# Patient Record
Sex: Male | Born: 1940 | Race: White | Hispanic: No | Marital: Married | Smoking: Never smoker
Health system: Southern US, Community
[De-identification: ages and names within clinical notes are randomized; demographics above are authoritative.]

## PROBLEM LIST (undated history)

## (undated) DIAGNOSIS — I714 Abdominal aortic aneurysm, without rupture, unspecified: Secondary | ICD-10-CM

## (undated) DIAGNOSIS — I779 Disorder of arteries and arterioles, unspecified: Secondary | ICD-10-CM

## (undated) DIAGNOSIS — T7840XA Allergy, unspecified, initial encounter: Secondary | ICD-10-CM

## (undated) DIAGNOSIS — H269 Unspecified cataract: Secondary | ICD-10-CM

## (undated) DIAGNOSIS — E785 Hyperlipidemia, unspecified: Secondary | ICD-10-CM

## (undated) DIAGNOSIS — R011 Cardiac murmur, unspecified: Secondary | ICD-10-CM

## (undated) DIAGNOSIS — M1990 Unspecified osteoarthritis, unspecified site: Secondary | ICD-10-CM

## (undated) DIAGNOSIS — N189 Chronic kidney disease, unspecified: Secondary | ICD-10-CM

## (undated) DIAGNOSIS — M109 Gout, unspecified: Secondary | ICD-10-CM

## (undated) DIAGNOSIS — D126 Benign neoplasm of colon, unspecified: Secondary | ICD-10-CM

## (undated) DIAGNOSIS — H409 Unspecified glaucoma: Secondary | ICD-10-CM

## (undated) DIAGNOSIS — I739 Peripheral vascular disease, unspecified: Secondary | ICD-10-CM

## (undated) DIAGNOSIS — I10 Essential (primary) hypertension: Secondary | ICD-10-CM

## (undated) DIAGNOSIS — E559 Vitamin D deficiency, unspecified: Secondary | ICD-10-CM

## (undated) DIAGNOSIS — K838 Other specified diseases of biliary tract: Secondary | ICD-10-CM

## (undated) DIAGNOSIS — I2510 Atherosclerotic heart disease of native coronary artery without angina pectoris: Secondary | ICD-10-CM

## (undated) HISTORY — DX: Benign neoplasm of colon, unspecified: D12.6

## (undated) HISTORY — PX: OTHER SURGICAL HISTORY: SHX169

## (undated) HISTORY — PX: CERVICAL DISC SURGERY: SHX588

## (undated) HISTORY — DX: Hyperlipidemia, unspecified: E78.5

## (undated) HISTORY — DX: Gout, unspecified: M10.9

## (undated) HISTORY — PX: TOTAL HIP ARTHROPLASTY: SHX124

## (undated) HISTORY — DX: Peripheral vascular disease, unspecified: I73.9

## (undated) HISTORY — DX: Abdominal aortic aneurysm, without rupture: I71.4

## (undated) HISTORY — DX: Vitamin D deficiency, unspecified: E55.9

## (undated) HISTORY — PX: SHOULDER ARTHROSCOPY: SHX128

## (undated) HISTORY — DX: Chronic kidney disease, unspecified: N18.9

## (undated) HISTORY — DX: Abdominal aortic aneurysm, without rupture, unspecified: I71.40

## (undated) HISTORY — DX: Disorder of arteries and arterioles, unspecified: I77.9

## (undated) HISTORY — DX: Unspecified cataract: H26.9

## (undated) HISTORY — PX: BACK SURGERY: SHX140

## (undated) HISTORY — PX: CARDIAC CATHETERIZATION: SHX172

## (undated) HISTORY — DX: Allergy, unspecified, initial encounter: T78.40XA

## (undated) HISTORY — DX: Unspecified glaucoma: H40.9

---

## 1991-06-26 HISTORY — PX: HERNIA REPAIR: SHX51

## 2001-06-25 DIAGNOSIS — D126 Benign neoplasm of colon, unspecified: Secondary | ICD-10-CM

## 2001-06-25 HISTORY — DX: Benign neoplasm of colon, unspecified: D12.6

## 2002-05-20 ENCOUNTER — Encounter (INDEPENDENT_AMBULATORY_CARE_PROVIDER_SITE_OTHER): Payer: Self-pay | Admitting: Specialist

## 2002-05-20 ENCOUNTER — Ambulatory Visit (HOSPITAL_COMMUNITY): Admission: RE | Admit: 2002-05-20 | Discharge: 2002-05-20 | Payer: Self-pay | Admitting: Gastroenterology

## 2004-03-06 ENCOUNTER — Ambulatory Visit (HOSPITAL_COMMUNITY): Admission: RE | Admit: 2004-03-06 | Discharge: 2004-03-07 | Payer: Self-pay | Admitting: Orthopaedic Surgery

## 2004-06-25 HISTORY — PX: EYE SURGERY: SHX253

## 2004-12-29 ENCOUNTER — Encounter: Admission: RE | Admit: 2004-12-29 | Discharge: 2004-12-29 | Payer: Self-pay | Admitting: Cardiology

## 2005-01-03 ENCOUNTER — Ambulatory Visit (HOSPITAL_COMMUNITY): Admission: RE | Admit: 2005-01-03 | Discharge: 2005-01-04 | Payer: Self-pay | Admitting: Cardiology

## 2005-12-06 ENCOUNTER — Encounter: Admission: RE | Admit: 2005-12-06 | Discharge: 2005-12-06 | Payer: Self-pay | Admitting: Cardiology

## 2005-12-13 ENCOUNTER — Inpatient Hospital Stay (HOSPITAL_COMMUNITY): Admission: RE | Admit: 2005-12-13 | Discharge: 2005-12-14 | Payer: Self-pay | Admitting: Cardiology

## 2006-03-06 ENCOUNTER — Inpatient Hospital Stay (HOSPITAL_COMMUNITY): Admission: RE | Admit: 2006-03-06 | Discharge: 2006-03-09 | Payer: Self-pay | Admitting: Orthopaedic Surgery

## 2007-01-29 ENCOUNTER — Observation Stay (HOSPITAL_COMMUNITY): Admission: EM | Admit: 2007-01-29 | Discharge: 2007-01-31 | Payer: Self-pay | Admitting: Emergency Medicine

## 2009-01-19 ENCOUNTER — Encounter: Admission: RE | Admit: 2009-01-19 | Discharge: 2009-01-19 | Payer: Self-pay | Admitting: Cardiology

## 2009-06-07 ENCOUNTER — Inpatient Hospital Stay (HOSPITAL_COMMUNITY): Admission: EM | Admit: 2009-06-07 | Discharge: 2009-06-10 | Payer: Self-pay | Admitting: Emergency Medicine

## 2009-06-07 ENCOUNTER — Encounter (INDEPENDENT_AMBULATORY_CARE_PROVIDER_SITE_OTHER): Payer: Self-pay | Admitting: Internal Medicine

## 2010-09-25 LAB — BASIC METABOLIC PANEL
Calcium: 8.7 mg/dL (ref 8.4–10.5)
Chloride: 110 mEq/L (ref 96–112)
Chloride: 113 mEq/L — ABNORMAL HIGH (ref 96–112)
Creatinine, Ser: 1.72 mg/dL — ABNORMAL HIGH (ref 0.4–1.5)
GFR calc Af Amer: 52 mL/min — ABNORMAL LOW (ref >60)
GFR calc non Af Amer: 40 mL/min — ABNORMAL LOW (ref >60)
Glucose, Bld: 95 mg/dL (ref 70–99)
Potassium: 4.2 mEq/L (ref 3.5–5.1)
Potassium: 4.2 mEq/L (ref 3.5–5.1)
Sodium: 140 mEq/L (ref 135–145)

## 2010-09-25 LAB — CBC
HCT: 29.9 % — ABNORMAL LOW (ref 39.0–52.0)
HCT: 31 % — ABNORMAL LOW (ref 39.0–52.0)
MCHC: 34.6 g/dL (ref 30.0–36.0)
MCV: 90.7 fL (ref 78.0–100.0)
Platelets: 126 10*3/uL — ABNORMAL LOW (ref 150–400)
RBC: 3.41 MIL/uL — ABNORMAL LOW (ref 4.22–5.81)
RDW: 15 % (ref 11.5–15.5)
WBC: 4.5 10*3/uL (ref 4.0–10.5)
WBC: 4.6 10*3/uL (ref 4.0–10.5)

## 2010-09-25 LAB — PROTIME-INR
INR: 1.03 (ref 0.00–1.49)
Prothrombin Time: 13.4 seconds (ref 11.6–15.2)

## 2010-09-26 LAB — CARDIAC PANEL(CRET KIN+CKTOT+MB+TROPI)
CK, MB: 0.8 ng/mL (ref 0.3–4.0)
Relative Index: INVALID (ref 0.0–2.5)
Total CK: 40 U/L (ref 7–232)

## 2010-09-26 LAB — URINALYSIS, ROUTINE W REFLEX MICROSCOPIC
Nitrite: NEGATIVE
Protein, ur: NEGATIVE mg/dL
Urobilinogen, UA: 1 mg/dL (ref 0.0–1.0)
pH: 5.5 (ref 5.0–8.0)

## 2010-09-26 LAB — BASIC METABOLIC PANEL
BUN: 24 mg/dL — ABNORMAL HIGH (ref 6–23)
CO2: 22 mEq/L (ref 19–32)
CO2: 24 mEq/L (ref 19–32)
Calcium: 8.8 mg/dL (ref 8.4–10.5)
Chloride: 109 mEq/L (ref 96–112)
Chloride: 112 mEq/L (ref 96–112)
Creatinine, Ser: 1.69 mg/dL — ABNORMAL HIGH (ref 0.4–1.5)
GFR calc Af Amer: 49 mL/min — ABNORMAL LOW (ref >60)
GFR calc non Af Amer: 40 mL/min — ABNORMAL LOW (ref >60)
Glucose, Bld: 135 mg/dL — ABNORMAL HIGH (ref 70–99)
Glucose, Bld: 99 mg/dL (ref 70–99)
Potassium: 4.2 mEq/L (ref 3.5–5.1)
Sodium: 140 mEq/L (ref 135–145)

## 2010-09-26 LAB — CBC
Hemoglobin: 10.4 g/dL — ABNORMAL LOW (ref 13.0–17.0)
MCV: 90.6 fL (ref 78.0–100.0)
RBC: 3.32 MIL/uL — ABNORMAL LOW (ref 4.22–5.81)
WBC: 5 10*3/uL (ref 4.0–10.5)

## 2010-09-26 LAB — DIFFERENTIAL
Basophils Absolute: 0 10*3/uL (ref 0.0–0.1)
Basophils Relative: 1 % (ref 0–1)
Eosinophils Relative: 4 % (ref 0–5)
Monocytes Absolute: 0.3 10*3/uL (ref 0.1–1.0)

## 2010-09-26 LAB — CK TOTAL AND CKMB (NOT AT ARMC)
CK, MB: 0.9 ng/mL (ref 0.3–4.0)
Relative Index: INVALID (ref 0.0–2.5)

## 2010-09-26 LAB — PROTHROMBIN GENE MUTATION

## 2010-09-26 LAB — ANTITHROMBIN III: AntiThromb III Func: 104 % (ref 76–126)

## 2010-09-26 LAB — D-DIMER, QUANTITATIVE: D-Dimer, Quant: 1.9 ug/mL-FEU — ABNORMAL HIGH (ref 0.00–0.48)

## 2010-09-26 LAB — POCT CARDIAC MARKERS: CKMB, poc: <1 ng/mL — ABNORMAL LOW (ref 1.0–8.0)

## 2010-09-26 LAB — TROPONIN I
Troponin I: 0.01 ng/mL (ref 0.00–0.06)
Troponin I: 0.01 ng/mL (ref 0.00–0.06)

## 2010-09-26 LAB — APTT
aPTT: 27 seconds (ref 24–37)
aPTT: 27 seconds (ref 24–37)

## 2010-09-26 LAB — LUPUS ANTICOAGULANT PANEL
Lupus Anticoagulant: NOT DETECTED
dRVVT Incubated 1:1 Mix: 38.5 secs (ref 36.1–47.0)

## 2010-09-26 LAB — HEPATIC FUNCTION PANEL
ALT: 14 U/L (ref 0–53)
AST: 18 U/L (ref 0–37)
Albumin: 3.5 g/dL (ref 3.5–5.2)
Bilirubin, Direct: 0.1 mg/dL (ref 0.0–0.3)
Indirect Bilirubin: 0.4 mg/dL (ref 0.3–0.9)
Total Protein: 6 g/dL (ref 6.0–8.3)

## 2010-09-26 LAB — PROTEIN S ACTIVITY: Protein S Activity: 92 % (ref 69–129)

## 2010-09-26 LAB — BETA-2-GLYCOPROTEIN I ABS, IGG/M/A: Beta-2-Glycoprotein I IgA: <3 U/mL (ref <=15)

## 2010-09-26 LAB — CALCIUM: Calcium: 8.5 mg/dL (ref 8.4–10.5)

## 2010-09-26 LAB — MAGNESIUM: Magnesium: 2 mg/dL (ref 1.5–2.5)

## 2010-09-26 LAB — PROTIME-INR: Prothrombin Time: 14.5 seconds (ref 11.6–15.2)

## 2010-09-26 LAB — FACTOR 5 LEIDEN

## 2010-11-07 NOTE — Cardiovascular Report (Signed)
NAME:  Terry Garner, Terry Garner NO.:  [id number]   MEDICAL RECORD NO.:  [id number]          PATIENT TYPE:  INP   LOCATION:  2807                         FACILITY:  [facility]   PHYSICIAN:  Bryson Dames, M.D.DATE OF BIRTH:  Aug 30, 1940   DATE OF PROCEDURE:  DATE OF DISCHARGE:                            CARDIAC CATHETERIZATION   PROCEDURE:  1. Selective coronary angiography by Judkins technique.  2. Retrograde left heart catheterization.  3. Left ventricular angiography.  4. Direct coronary artery stenting of the second diagonal branch of      the left anterior descending.   COMPLICATIONS:  None.   PROCEDURE:  The diagnostic left heart catheterization, LV angiogram, and  coronary angiography was performed by Tery Sanfilippo, M.D., and the  direct stenting of the mid-second diagonal branch was performed by Dr.  Myrtice Lauth.  I was present to proctor Dr. Elisabeth Cara during his left  heart catheterization.  No complications occurred.  Dr. Francine Graven  results show excellent angiographic renderings of the patient's coronary  arteries.  No complications occurred.   RESULTS:  Pressures:  The left ventricular pressure was [id number], end-  diastolic pressure 16.  Central aortic pressure 125/60, mean of 85.  No  aortic valve gradient by pullback technique.   ANGIOGRAPHIC RESULTS:  The patient's right coronary artery was small and  nondominant, with no significant atherosclerotic lesions seen.  There  was definite catheter damping of the proximal RCA which led to the  patient having a vagal episode with sinus bradycardia, rates of 30-33,  and low blood pressure.  The patient received intravenous fluids wide  open and did get an ampule of atropine and within approximately 5  minutes had returned to his baseline hemodynamic status.   Intracoronary artery nitroglycerin was given through the catheter which  was now used to visualize the right coronary artery, and that catheter  was a  6-French guide catheter without side holes to minimize the chances  of further spasm.  After intracoronary nitroglycerin was given, the  spasm was nearly completely resolved, and it had gone from approximately  75% down to about 30%.  No lesions of any significance were seen  therefore in the right coronary artery.   The left coronary artery showed the left main coronary artery to be  open.  There was a very, very large dominant circumflex coronary artery  with one very large obtuse marginal branch arising halfway down the  vessel, and then the distal vessel gave three distal branches, one of  which was a posterior descending and then two posterolateral branches.  No lesions were seen in this large and dominant circumflex coronary  artery.  There was an intermediate ramus branch which also showed no  significant lesion formation.   The left anterior descending coronary artery coursed to the cardiac apex  and wrapped around the apex and was a fairly large vessel, with no  lesions seen.   The patient was noted to have a second diagonal branch 95% stenosed  eccentrically and focally.  There was a second diagonal branch which  historically has had a stent placed  in it, but it could not be seen due  to technical aspects of this case, specifically that the patient's chest  was hard to penetrate with radiation.   Left ventricular angiogram showed an LVEF of 60% without wall motion  abnormalities.   Percutaneous coronary direct stenting of the second diagonal branch  midway down was accomplished using a Mini Vision stent that was 2.5 x 12-  mm in length.  I stented directly using a Prowater wire as the guide.  Two inflations to 14-16 atmospheres were performed, and the lesion of  95% was reduced to 0.  There was TIMI III flow in the distal vessel.  It  was extremely, extremely hard to see what was being done because of poor  penetration of with x-ray.  I do feel that the result was  demonstrated  in two views.   FINAL DIAGNOSES:  1. Recent angina pectoris.  2. 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  3. 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  4. Normal left ventricular systolic function.   PLAN:  The patient will be followed overnight on unit 6500 and would  seem to be a candidate for discharge tomorrow, January 31, 2007 should his  clinical course be uneventful.           ______________________________  Bryson Dames, M.D.     WHG/MEDQ  D:  01/30/2007  T:  01/31/2007  Job:  [id number]   cc:   Jeanella Craze. Little, M.D.

## 2010-11-07 NOTE — H&P (Signed)
NAME:  Terry Garner, Terry Garner NO.:  [id number]   MEDICAL RECORD NO.:  [id number]          PATIENT TYPE:  INP   LOCATION:  [place]                         FACILITY:  [facility]   PHYSICIAN:  Jeanella Craze. Little, M.D. DATE OF BIRTH:  1940-07-12   DATE OF ADMISSION:  01/29/2007  DATE OF DISCHARGE:                              HISTORY & PHYSICAL   CHIEF COMPLAINT:  Left chest pain.   HISTORY OF PRESENT ILLNESS:  The patient is a 70 year old male with a  history of coronary disease.  He had a diagonal Mini Vision stent placed  in June 2006.  He had an LAD drug-eluting stent placed in June 2007  after an abnormal preoperative Cardiolite study prior to hip surgery.  He has essentially done well since that stent until about 2 weeks ago  when he noted some localized left-sided chest pain.  His symptoms are  worse when he lies on his left side.  He does say he has had some  increasing dyspnea on exertion and has had some intermittent left hand  tingling.  Though atypical, his symptoms are similar to his pre PCI  symptoms, according to the patient.  About 3 a.m. this morning he felt  like his symptoms were worse and he came to the emergency room.  He has  not taken nitroglycerin.  Currently, he is pain-free.  His initial  troponin is negative.   PAST MEDICAL HISTORY:  Remarkable for DJD.  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  He has had  prior C-spine fusion in 2002.  There is a question of sleep apnea,  apparently had a sleep study but never completed this because of some  insurance problems.  He does have treated dyslipidemia and treated  hypertension.  He has baseline bradycardia and a right bundle-branch  block.  He did have a retinal bleed this summer and went to [facility].   CURRENT MEDICATIONS:  1. Quinapril 40 mg a day.  2. Allopurinol 150 mg a day.  3. Aspirin 81 mg a day.  4. Crestor 5 mg a day.  5. Hydrochlorothiazide 25 mg a day.  6.  Plavix 75 mg a day.  7. Fish oil.  8. Flaxseed oil.   He is allergic to COUMADIN; this caused a rash in the past.   SOCIAL HISTORY:  He is married.  He never smoked.   FAMILY HISTORY:  Remarkable for coronary disease, three out of four  brothers and one sister have coronary disease.   REVIEW OF SYSTEMS:  Essentially unremarkable except for above.  He  denies any hemoptysis.  He has not had GI bleeding or melena.  He has  not had lower extremity clots or swelling.  He does have some  gastroesophageal reflux symptoms.   PHYSICAL EXAMINATION:  Blood pressure 158/66, pulse 50, temperature  98.3, O2 saturation is 99% on room air.  GENERAL:  He is a well-developed, well-nourished male in no acute  distress.  HEENT:  Normocephalic.  Extraocular movements are intact.  Sclerae are  nonicteric.  NECK:  Without  bruit or JVD.  CHEST:  Clear to auscultation and percussion.  CARDIAC:  Reveals a regular rate and rhythm with a soft systolic murmur  at the aortic valve area and normal S1, S2.  ABDOMEN:  Nontender.  No hepatosplenomegaly.  EXTREMITIES:  Without edema.  Distal pulses are 3+/4.  NEUROLOGIC:  Grossly intact.  He is awake, alert, oriented, and  cooperative.  Moves all extremities without obvious deficit.  SKIN:  Warm and dry.   EKG shows sinus bradycardia, right bundle-branch block.   LABORATORY STUDIES:  Sodium 142, potassium 4.9, BUN 15, creatinine 1.7.  White count 5.0, hemoglobin 11.7, hematocrit 33.8, platelets 152.  Troponin is negative x1.   IMPRESSION:  1. Chest pain, question cardiac.  2. Coronary disease with prior diagonal and left anterior descending      artery drug-eluting stent placement.  3. Treated hypertension.  4. Renal insufficiency.  His creatinine in June 2007 was 1.5, it is      now 1.7.  5. Dyslipidemia, on Crestor.  6. Right bundle-branch block and bradycardia.  7. Arthritis, degenerative joint disease and gout.   PLAN:  The patient will be  admitted to telemetry to rule out MI.  He is  some risk for catheterization secondary to his renal insufficiency.  Will hold his ACE inhibitor and hydrochlorothiazide and hydrate.  Will  also go ahead and check a D-dimer and plan further evaluation for  pulmonary embolism if this is elevated.      Erlene Quan, P.A.    ______________________________  Jeanella Craze. Little, M.D.    Meryl Dare  D:  01/29/2007  T:  01/29/2007  Job:  [id number]

## 2010-11-07 NOTE — Discharge Summary (Signed)
NAME:  Terry Garner, Terry Garner               ACCOUNT NO.:  [id number]   MEDICAL RECORD NO.:  [id number]          PATIENT TYPE:  INP   LOCATION:  [id number]                         FACILITY:  [facility]   PHYSICIAN:  York Grice, P.A. DATE OF BIRTH:  11-25-1940   DATE OF ADMISSION:  01/29/2007  DATE OF DISCHARGE:  01/31/2007                               DISCHARGE SUMMARY   DISCHARGE DIAGNOSES:  1. Unstable angina pectoris.  The patient was ruled out for an      myocardial infarction status post cath during this admission with a      stenting of the diagonal branch left anterior descending.  2. Hypertension.  3. Dyslipidemia.  4. Mild renal insufficiency.  5. History of rectal bleeding, July 2008.  This was treated at [facility]  6. Gout.  7. Right bundle branch block.  8. Probable obstructive sleep apnea.   HISTORY OF PRESENT ILLNESS:  This is a 70 year old gentleman, patient of  Dr. Rex Kras, with previous history of coronary artery disease, who  presented to the emergency room at [facility] with complaints  of chest pain radiating throughout the left side of the chest into the  left arm, and increased dyspnea on exertion.  He also complained of left  arm tingling, and due to his symptoms and previous history of coronary  artery disease, as well as similarity between the symptoms in the past  and present ones, he was admitted to [facility] in rule out MI  protocol.   HOSPITAL ADMISSION:  The patient was admitted to the telemetry unit.  We  monitored his rhythm, and he maintained sinus rhythm throughout.  Then,  cardiac enzymes were cycled, and they revealed no ischemia.  Although  his chest pain was relieved, still the decision was made to proceed with  coronary angiography, which was performed on January 30, 2007.   Dr. Melvern Banker did the procedure, and it showed a high-grade 95% stenosis of  the diagonal branch, and Dr. Melvern Banker performed direct stenting of that  branch with MultiVision stent.  No complications were noted immediately  and in the few hours post-procedure.  The patient tolerated it well, and  in the morning, was assessed by Dr. Melvern Banker, ambulating down the ward  with no difficulties.   HOSPITAL LABORATORY DATA:  His cardiac enzymes remained negative x3.  Hemoglobin was 12.0, hematocrit 33.7, white blood cell count 5.7,  thrombocytes 150, sodium 138, potassium 4.6, chloride 104, CO2 of 28,  glucose 97, BUN 12, creatinine 1.81.   DISCHARGE INSTRUCTIONS:  The patient was considered stable for discharge  home.  He was instructed not to drive or lift weights greater than 5  pounds for 3 days post-cath.  Increase activity gradually.  Call our  office if he notices any problems with groin site.   DISCHARGE MEDICATIONS:  1. Quinapril 40 mg daily.  2. Allopurinol 150 mg daily.  3. Crestor 5 mg daily.  4. Aspirin 325 mg daily.  5. Plavix 75 mg daily.  6. Fish oil as before.  7. Hydrochlorothiazide 25 mg  daily.   DISCHARGE FOLLOWUP:  Dr. Rex Kras will see the patient in our office on  February 11, 2007, at 11:30 a.m.      York Grice, P.A.     MK/MEDQ  D:  01/31/2007  T:  02/01/2007  Job:  [id number]

## 2010-11-10 NOTE — Discharge Summary (Signed)
NAME:  Terry Garner, Terry Garner               ACCOUNT NO.:  [id number]   MEDICAL RECORD NO.:  [id number]          PATIENT TYPE:  INP   LOCATION:  5011                         FACILITY:  [facility]   PHYSICIAN:  Mark C. Lorin Mercy, M.D.    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   ADDITIONAL DIAGNOSES:  1. Hypertension.  2. Glaucoma.  3. Previous percutaneous transluminal angioplasty.  4. Coronary artery disease.  5. Obstructive sleep apnea.  6. Long-term use of anticoagulants.  7. Hyperlipidemia.   A 70 year old male has had progressive left hip pain.  He has history of  gout, cardiac stenting in 2002, cervical fusion in 2005.  X-ray showed bone-  on-bone changes, collapse of the head, and he is having significant pain.   He was taken to the operating room on 03/06/06 and underwent total hip  arthroplasty with 2 Pinnacle size 56 cup, Pinnacle Marathon liner 10-degree  +4.  Eccentric liners, #7-size high offset tapered stem with HA coating,  Summit DePuy.  Postoperatively he was seen by pharmacy for resumption of  anticoagulation.  Hemoglobin was 10.7, potassium 5.2.  The IV was changed,  and the potassium was removed.  BUN was 31.  Sciatic nerve function was  intact.  Goal was INR of 2-3.  He was on Lovenox immediately postop.  He  made good progress with therapy.  Converted from IV PCA to p.o. pain  medication.  Creatinine was 1.8 on postop day 3.  Preop was 1.6, hemoglobin  was 9.2.  He did have blood loss anemia secondary to intraoperative blood  loss.  He was ready for discharge on 03/09/06.  Condition was improved.  The  hip was dry.  He was taking 2.5 mg Coumadin daily.  He was ambulatory, had  good relief of his preop pain.  Leg lengths were equal.  Sciatic function  was intact.  Hip protocol was gone over for  dislocation prevention.  Condition at discharge is improved.      Mark C. Lorin Mercy, M.D.  Electronically Signed     MCY/MEDQ  D:  04/17/2006  T:  04/18/2006  Job:  [id number]

## 2010-11-10 NOTE — Op Note (Signed)
NAME:  Terry Garner, Terry Garner                         ACCOUNT NO.:  [id number]   MEDICAL RECORD NO.:  [id number]                   PATIENT TYPE:  OIB   LOCATION:  2550                                 FACILITY:  [facility]   PHYSICIAN:  Mark C. Lorin Mercy, M.D.                 DATE OF BIRTH:  [birth year]   DATE OF PROCEDURE:  03/06/2004  DATE OF DISCHARGE:                                 OPERATIVE REPORT   PREOPERATIVE DIAGNOSIS:  C5-6 spondylosis.   POSTOPERATIVE DIAGNOSIS:  C5-6 spondylosis.   PROCEDURE:  1.  C5-6 anterior cervical diskectomy and fusion.  2.  Removal of herniated nucleus pulposus.  3.  Left iliac crest bone graft.   SURGEON:  Mark C. Lorin Mercy, M.D.   ASSISTANT:  Theresia Majors, P.A.-C.   ANESTHESIA:  GOT.   ESTIMATED BLOOD LOSS:  Less than 100 mL.   DESCRIPTION OF PROCEDURE:  After the induction of general anesthesia and  orotracheal intubation, with the head in halter traction, __________ behind  the hips.  The arms were tucked to the sides with padding over the ulnar  nerve.  The neck and left iliac crest were prepped with DuraPrep.  The area  was squared with towels.  Sterile skin markers on the neck.  Betadine Vi-  Drape.  Sterile Mayo stand at the head and a thyroid sheet.  An incision was  made starting at the midline and extending to the left directly over the C5-  6 level.  The platysma was divided in line with its fibers, and blunt  dissection with the carotid sheath contents lateral, down to a level of the  longus coli with a needle, short #25 placed in the C5-6 disk with a cross-  table lateral confirming that this was the correct level.  As soon as the  needle was removed, the #15 blade was used to remove a portion of the disk,  marking it and grabbing it with the pituitary.  The self-retaining Cloward  retractors were placed right and left, with T-blades right and left, and  smooth blades up and down.  A diskectomy was performed.  The operating  microscope was  draped and brought in after.  A hole was drilled.  A 12 mm  hole, back to the posterior cortex.  The operating microscope was used for  visualization.  There still appeared to be 2 mm left.  The scope was angled  out of the way, and continued drilling for another 1-1/2 mm.  The operative  microscope put back into position, and spurs were removed.  Then 1 and 2 mm  Kerrison were used.  The Black nerve hook was gently pulled through the  posterior longitudinal ligament, cutting with the #15 blade, and then the  removal of the ligament for direct visualization of the cord.  There was  some disk material retained by the ligament behind the vertebral bodies that  were removed, and the spurs removed both right and left.  Once the  decompression of the dura was completed, and palpation with the hockey stick  up, both foramina showed no areas of compression on the __________ vertebral  side.  After irrigation a depth cage measurement was made.  A plug was  initially cut from the left iliac crest.  It was slightly small.  A little  bit of __________ was not large enough to fit tightly in the 12 mm hole.  A  14 mm plug cutter was then used, cutting the second plug parallel to the  first.  It was an excellent plug, thick, that was bullet-nosed on the tip,  trimmed down, and then impacted into place after irrigation using head  Holter traction.  The plug was flushed and the anterior cortex had excellent  tight fit.  It was unable to be dislodged.  After irrigation with saline  solution, a Hemovac was placed through a separate stab incision with the in-  and-out technique using the trocar.  The platysma was closed with #3-0  Vicryl and #4-0 Vicryl subcuticular skin closure.  The fascia of the iliac  crest was closed with #0 Vicryl, #2-0 Vicryl in the subcutaneous tissues,  with a skin staple closure.  Steri-Strips, Marcaine infiltration.  A soft  collar was applied.  The instrument count and needle count  were correct.                                               Mark C. Lorin Mercy, M.D.    MCY/MEDQ  D:  03/06/2004  T:  03/06/2004  Job:  [id number]

## 2010-11-10 NOTE — Discharge Summary (Signed)
NAME:  Terry Garner, Terry Garner               ACCOUNT NO.:  [id number]   MEDICAL RECORD NO.:  [id number]          PATIENT TYPE:  OIB   LOCATION:  6533                         FACILITY:  [facility]   PHYSICIAN:  Jeanella Craze. Little, M.D. DATE OF BIRTH:  [birth year]   DATE OF ADMISSION:  01/03/2005  DATE OF DISCHARGE:  01/04/2005                                 DISCHARGE SUMMARY   DISCHARGE DIAGNOSES:  1.  Dyspnea on exertion.  2.  Coronary artery disease, status post optional diagonal mini-vision stent      this admission.  3.  History of glaucoma.   HOSPITAL COURSE:  The patient is a 70 year old male followed by Dr. Melford Aase  and seen by Dr. Rex Kras in the past. He had a catheterization in 1998 that  showed good LV function, and 50% narrowing in the first diagonal with minor  irregularities in the LAD. His last Cardiolite was in September of 2005  which showed no changes. The patient was seen in the office by Dr. Rex Kras  12/29/2004. He reported a dramatic increase in dyspnea on exertion. He was  set up for diagnostic catheterization. Outpatient labs done 12/31/2004  revealed a LDL of 160 and HDL of 35. The patient was admitted to the  outpatient unit for catheterization which was done 01/03/2005. This revealed  a proximal 80% diagonal stenosis in a moderate-sized vessel. He had 30%  narrowing of the LAD. The RCA was nondominant and circumflex was without  significant stenosis, and his LV function was normal. There was some mitral  valve prolapse but no MR. The patient underwent intervention to the optional  diagonal with a mini-vision stent and a good result. We feel he can be  discharged 01/04/2005. Dr. Rex Kras would like him to get outpatient carotid  Doppler's and these have been scheduled for 01/31/05. We also added low dose  beta-blocker, Plavix, and Vytorin 10/40.   DISCHARGE MEDICATIONS:  1.  Vytorin 10/40 daily.  2.  Toprol-XL 25 milligrams a day.  3.  Plavix 75 milligrams a day.  4.  Aspirin 81  milligrams a day.  5.  Allopurinol 150 milligrams a day.  6.  Xalatan ophthalmic drops as taken at home.  7.  Nitroglycerin sublingual p.r.n.   LABS:  White count 7.6, hemoglobin 13.5, hematocrit 39.3, platelets 202.  Sodium 139, potassium 4.1, BUN 12, creatinine 1.5, troponin is 0.17 with a  CK of 77, MB is 2.1. This is postprocedure. Chest x-ray on the 12/28/2004  showed no acute abnormality.   DISPOSITION:  The patient is discharged in stable condition and will follow-  up with Dr. Rex Kras on 01/10/2005 at 2:45. He will need follow-up LFT's and a  lipid profile in about 6 weeks, as well as carotid Doppler's as scheduled.      Jolyne Loa  D:  01/04/2005  T:  01/04/2005  Job:  [id number]   cc:   [name], M.D.  [address] [place]  [place], [place] [address]  Fax: [phone]

## 2010-11-10 NOTE — Cardiovascular Report (Signed)
NAME:  Terry Garner, Terry Garner NO.:  [id number]   MEDICAL RECORD NO.:  [id number]          PATIENT TYPE:  OIB   LOCATION:  2899                         FACILITY:  [facility]   PHYSICIAN:  Jeanella Craze. Little, M.D. DATE OF BIRTH:  27-Feb-1941   DATE OF PROCEDURE:  12/13/2005  DATE OF DISCHARGE:                              CARDIAC CATHETERIZATION   INDICATIONS FOR PROCEDURE:  This 70 year old male is being preopped for hip  replacement.  He has had some intermittent left chest pain.  He had had a  Mini Vision stent placed to his optional diagonal in July 2006, and because  of this a Cardiolite study was performed.  This was in May 2007 and showed  anterior lateral ischemia with a 65% ejection fraction. In view of the  abnormality on his Cardiolite study, he was brought to [facility] as an  outpatient for cardiac catheterization.  His creatinine was 1.5, and because  of this he was given IV bicarbonate drip prior to his procedure.  No  ventriculogram was performed because of his elevated creatinine.   COMPLICATIONS:  None.   TOTAL CONTRAST:  100 cc.   PROCEDURE:  The patient was prepped and draped in the usual sterile fashion,  exposing the right groin.  Following local anesthetic with 1%, Xylocaine the  Seldinger technique was employed, and a 5-French introducer sheath was  placed into the right femoral artery.  Left and right coronary arteriography  was performed.  At this point, a 5 [place] sheath was upgraded to a 6 [place]  sheath for percutaneous intervention.  A JL-4 guide catheter and short Luge  wire were used.  The intervention was performed.   RESULTS:  Hemodynamic monitoring.  Central aortic pressure was 144/66.  The  aortic valve was crossed, and the left ventricular end-diastolic pressure  was not recorded, but it was not noted to be elevated.   CORONARY ARTERIOGRAPHY:  1.  Left main normal.  It trifurcated.  2.  LAD.  In the proximal portion of the LAD was  eccentric area of 70%      narrowing.  The LAD itself extended down to the apex of the heart.  The      mid and distal vessel was free of disease. There was a moderate-size      first diagonal that was free of significant disease.  3.  Circumflex.  This was a large dominant vessel, with two very large OM      vessels which were widely patent.  The third OM vessel was small and      free of disease.  The PDA came off the distal circumflex and was free of      disease.  4.  Optional diagonal. In the proximal portion of the optional diagonal was      a Mini Vision stent.  It was widely patent.  The vessel was long but      about 2.5 mm in diameter.  5.  Right coronary artery. This was a nondominant vessel, and it was small.   Because of the high-grade proximal stenosis  in the LAD, arranged for made  for intervention.  Because of his plan for upcoming surgery, a non drug-  eluting stent was purposefully used to facilitate his hip replacement  schedule.   A 6-French JL-4 guide was placed into the left main, and short Luge wire was  passed down the distal LAD.  At 3 x 15 mm Driver MR was then placed in the  proximal portion of the LAD in such a manner that both the proximal and  distal portion of the lesion was well covered.  The initial inflation was 16  atmospheres for 58 seconds, with the final inflation 18 atmospheres for 53  seconds.  With the inflations, the patient had chest pain that dissipated  when the balloon was deflated.  The area that had been 70% narrowed pre-  intervention appeared to be normal postintervention.  Intracoronary  nitroglycerin did not make any change in the appearance the vessel.  There  was brisk TIMI III flow pre and postintervention.  There was no evidence of  any dissection or thrombus formation.   Prior to the intervention, the patient was given double-bolus Integrilin and  6000 units of intravenous heparin and obtained an ACT pre-intervention of  276,  postintervention 87.   Following the procedure, the patient was given 600 mg of p.o. Plavix.   CONCLUSION:  Widely patent stent in the optional diagonal, with high-grade  stenosis in the proximal portion of the LAD that was intervened upon with a  non-drug-eluting stent, with an excellent result.   DISCUSSION:  The patient will require anticoagulation with Plavix for  approximately 8 weeks prior to his surgery. At that point, the Plavix can be  discontinued, and he can proceed on with hip replacement.   As commented upon above, the __________ with no gradient noted at the time  of pullback.           ______________________________  Jeanella Craze. Little, M.D.     ABL/MEDQ  D:  12/13/2005  T:  12/13/2005  Job:  [id number]   cc:   [name], M.D.  Fax: [phone]   Thana Farr. Lorin Mercy, M.D.  Fax: [phone]   Catheterization Laboratory

## 2010-11-10 NOTE — H&P (Signed)
NAME:  Terry Garner, Terry Garner               ACCOUNT NO.:  [id number]   MEDICAL RECORD NO.:  [id number]          PATIENT TYPE:  INP   LOCATION:  6527                         FACILITY:  [facility]   PHYSICIAN:  Erlene Quan, P.A.   DATE OF BIRTH:  September 16, 1940   DATE OF ADMISSION:  12/13/2005  DATE OF DISCHARGE:  12/14/2005                                HISTORY & PHYSICAL   HISTORY OF PRESENT ILLNESS:  Mr. Mogul is a 70 year old male followed by  Dr. Rex Kras and Dr. Melford Aase and Dr. Rodell Perna.  He was seen by Dr. Rex Kras  for preoperative clearance.  Cardiolite study was abnormal for anterior  lateral ischemia.  Dr. Rex Kras felt this to be in the distribution of the  diagonal.  His EF was 65%.  He was seen by Dr. Rex Kras 11/27/05 in the office  and set up for elective admission for diagnostic catheterization prior to a  hip replacement proposed by Dr. Lorin Mercy.  He is not symptomatic and not having  chest pain.  His past medical history is remarkable for coronary disease, he  had a mini Vision stent to his diagonal in June 2006.  He has history of  dyslipidemia.  He has a history of gout and arthritis.   CURRENT MEDICATIONS:  Sulindac 20 mg b.i.d., quinapril 40 mg a day, aspirin  81 mg a day, allopurinol 300 mg 1/2 tablet a day, HCTZ 25 mg a day, Crestor  5 mg a day.   He has no known drug allergies.   SOCIAL HISTORY:  He is married.  He never smoked, does not drink alcohol.   FAMILY HISTORY:  Noncontributory.   REVIEW OF SYSTEMS:  Essentially unremarkable except for noted above, he has  had a bout of diarrhea which the patient felt may be from Crestor.  Dr.  Rex Kras has put his Crestor on hold to see if this improves.   PHYSICAL EXAM:  Blood pressure 154/80, pulse 72, respirations 12.  GENERAL:  He is a well-developed, well-nourished male in no acute distress.  HEENT: Normocephalic, atraumatic.  Extraocular movements intact. Sclerae are  nonicteric.  NECK:  Without JVD.  CHEST: Clear to auscultation  and percussion.  CARDIAC:  Reveals regular rate and rhythm without obvious murmur or rub or  gallop.  ABDOMEN:  Nontender, nondistended.  EXTREMITIES:  Without edema, skin is warm and dry.   IMPRESSION:  1.  Abnormal Cardiolite study, rule out progression of coronary disease.  2.  Known coronary disease with previous non-DES diagonal stenting in 2006.  3.  Treated dyslipidemia, Crestor was currently on hold.  4.  Hypertension, controlled.   PLAN:  The patient be admitted for diagnostic catheterization per Dr.  Rex Kras.      Erlene Quan, P.AMeryl Dare  D:  01/09/2006  T:  01/09/2006  Job:  [id number]

## 2010-11-10 NOTE — Op Note (Signed)
NAME:  Terry Garner, Terry Garner                         ACCOUNT NO.:  [id number]   MEDICAL RECORD NO.:  [id number]                   PATIENT TYPE:  AMB   LOCATION:  ENDO                                 FACILITY:  [facility]   PHYSICIAN:  Jeryl Columbia, M.D.                 DATE OF BIRTH:  05/20/1941   DATE OF PROCEDURE:  05/20/2002  DATE OF DISCHARGE:                                 OPERATIVE REPORT   PROCEDURE PERFORMED:  Colonoscopy with polypectomy.   ENDOSCOPIST:  Jeryl Columbia, M.D.   INDICATIONS FOR PROCEDURE:  Screening.  Consent was signed after the risks,  benefits, methods and options were thoroughly discussed in the past.   MEDICINES USED:  Demerol 50 mg, Versed 5 mg.   DESCRIPTION OF PROCEDURE:  Rectal inspection was pertinent for external  hemorrhoids, small.  Digital exam was negative.  A video pediatric  adjustable colonoscope was inserted and easily advanced around the colon to  the cecum.  This did not require any abdominal pressure or any position  changes.  On insertion in the mid ascending colon, a small pedunculated  polyp was seen, snared and electrocautery applied.  The polyp was suctioned  through the scope and collected in the trap.  No other abnormalities were  seen on insertion.  The cecum was identified by the appendiceal orifice and  the ileocecal valve.  The prep was adequate.  There was some liquid stool  that required washing and suctioning and the scope was slowly withdrawn.  In  the ascending colon, two tiny polyps were seen and were each hot biopsied  and put with the other polyp that was there.  The scope was further  withdrawn.  There were some rare early left-sided diverticula seen and a  questionable tiny sigmoid polyp that was hot biopsied and put in a separate  container.  Once back in the rectum, the scope was retroflexed, pertinent  for some internal hemorrhoids.  The scope was straightened and readvanced a  short ways up the left side of the colon.   Air was suctioned, scope removed.  The patient tolerated the procedure well.  There were no immediate  complications.   ENDOSCOPIC DIAGNOSIS:  1. Internal and external small hemorrhoids.  2. Tiny sigmoid polyp hot biopsied.  3. Two tiny ascending polyps hot biopsied, one small pedunculated polyp     snared.  4. Very early left-sided diverticula.  5. Otherwise within normal limits to the cecum.    PLAN:  Await pathology to determine future colonic screening.  Happy to see  back p.r.n.  Otherwise return care to Dr. Melford Aase for the customary health  care maintenance to include yearly rectals and guaiacs.  Jeryl Columbia, M.D.    MEM/MEDQ  D:  05/20/2002  T:  05/20/2002  Job:  [id number]   cc:   [name], M.D.  [address], [place]  [place],  [address]  Fax: [phone]

## 2010-11-10 NOTE — Op Note (Signed)
NAME:  Terry Garner, Terry Garner               ACCOUNT NO.:  [id number]   MEDICAL RECORD NO.:  [id number]          PATIENT TYPE:  INP   LOCATION:  2550                         FACILITY:  [facility]   PHYSICIAN:  Mark C. Lorin Mercy, M.D.    DATE OF BIRTH:  02/05/1941   DATE OF PROCEDURE:  03/06/2006  DATE OF DISCHARGE:                                 OPERATIVE REPORT   PREOPERATIVE DIAGNOSIS:  Left hip osteoarthritis with avascular necrosis.   POSTOPERATIVE DIAGNOSIS:  Left hip osteoarthritis with avascular necrosis.   PROCEDURE:  Left total hip arthroplasty.   SURGEON:  Mark C. Lorin Mercy, M.D.   ASSISTANT:  Epimenio Foot, P.A.C.   ANESTHESIA:  GOT, plus Marcaine on skin, local.   COMPONENTS USED:  DePuy pedicle, 56-mm acetabular shell, polyethylene cup  for a 36-mm head, Marathon Pinnacle acetabular liner, 10-degree lip, +4  offset.  A #7 Summit tapered stem with DuoFix HA Coating, high offset, 12-14  taper, +5 36-mm Articules femoral head.   PROCEDURE:  After induction of general anesthesia, preoperative antibiotics  with Ancef, patient was placed in lateral position.  Standard prep and drape  was performed.  Usual impervious stockinette, Coban, and total hip sheets  and drapes.  Sterile skin marker embedded on the Vidrape x2 was applied.  Posterior approach was made.  Gluteus maximus was split in line with the  fibers. A large Steinmann pin was placed in the lateral pelvis above the  acetabulum underneath the gluteus medius, and then a drill bit laterally in  the trochanter.  The distance was measured between the two that were  parallel at 55 mm with the legs touching each other.   Next, the patient had piriformis tagged, a Charnley retractor placed.  Posterior capsule was cut.  Head was dislocated.  There showed central  depression from the avascular change but a piece was depressed 2-3 mm but  was nonmobile.  Sequential reaming, canal; started with reamer, lateralizer,  cookie cutter.   Sequential reaming and broaching up to #7 was performed.  Patient was large with a large canal and the 7 stem filled the canal well  with appropriate anteversion 15 and 20 degrees.  The neck was cut one  fingerbreadth above the lesser trochanter.  The canal was irrigated and  sponge was placed.  Acetabulum was exposed.  Phlegmon was debrided.  Sequential reaming up to 55 was performed for a 56 cup.  ASIS to sciatic  notch line was used with the preoperative extending lateral x-ray showing  this line being 15 degrees in the extending position for appropriate cup  flexion, and this had the cup pointed at the corner of the room.  There was  a couple of millimeters overhang of the cup posteriorly, couple of  millimeters anterior, and 1 mm overhang lateral.  Reaming was performed down  to the base of the fovea.  The cup was secured, trial was placed.  Trials  were used.  Based on stability the +5 restored the leg length back to its  anatomic position.  Permanent acetabular liner was inserted.  Permanent  femoral  component.  The trunnion was cleaned and then the ball popped on  which is 36 mm.  The hip was reduced.  Identical findings with stability  measurements, restoration of ligament began.  Piriformis repaired to gluteus  medius fascia.  Posterior capsule was resected during procedure is now  repaired.  Tensor fascia was closed posteriorly with #1 Tycron, 0 Vicryl,  and the  gluteus maximus fascia with 2-0 Vicryl, subcutaneous tissue and skin  closure.  Patient tolerated the procedure well, was transferred to the  recovery room in stable condition.  Instrument count and needle count was  correct.  Marcaine was infiltrated in the skin.  Postoperative knee  immobilizer was applied.      Mark C. Lorin Mercy, M.D.  Electronically Signed     MCY/MEDQ  D:  03/06/2006  T:  03/06/2006  Job:  [id number]

## 2010-11-10 NOTE — Cardiovascular Report (Signed)
NAME:  Terry Garner, Terry Garner               ACCOUNT NO.:  [id number]   MEDICAL RECORD NO.:  [id number]          PATIENT TYPE:  OIB   LOCATION:  2860                         FACILITY:  [facility]   PHYSICIAN:  Jeanella Craze. Little, M.D. DATE OF BIRTH:  1940/11/23   DATE OF PROCEDURE:  01/03/2005  DATE OF DISCHARGE:                              CARDIAC CATHETERIZATION   INDICATIONS FOR TEST:  This 70 year old male has had increasing dyspnea on  exertion and chest pain. He was referred by Dr. Anitra Lauth. 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   After obtaining informed consent, the patient was prepped and draped in the  usual sterile fashion, exposing the right groin. Following local anesthetic  with 1% percent Xylocaine, the Seldinger technique employed and a 5 [place]  introducer sheath was placed into the right femoral artery. Left and right  coronary arteriography and ventriculography in the RAO projection were  performed.   COMPLICATIONS:  None.   DIAGNOSTIC EQUIPMENT:  5 French Judkins configuration catheters.   TOTAL CONTRAST USED:  225 mL.   RESULTS:  1.  Hemodynamic monitoring:  The central aortic pressure was 153/72. The      left ventricular pressure was 155/7 with no significant valve gradient      noted at the time of pullback.  2.  Ventriculography:  Ventriculography in the RAO projection revealed      normal LV systolic function. Ejection fraction excess of 60%.      Ventricular ectopy was noted. 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 Left ventricular end-diastolic pressure was 15.  3.  Coronary arteriography:  The left main trifurcated.  4.  Optional diagonal (ramus intermediate):  Very proximal 80% area of      narrowing that did not involve the ostium. It was a moderate-sized      vessel at about 225 mm.  5.  Circumflex:  A dominant vessel giving rise to two large OM  vessels and a      PDA. This system was free of disease.  6.  LAD:  The LAD crossed the apex of the heart and was free of disease.      There was 30% narrowing of the ostium of the first diagonal.  7.  Right coronary artery:  Nondominant   Because of the high-grade stenosis in the optional diagonal, arrangements  were made for intervention. The 5 [place] system was upgraded to a 6 [place].  A JL4 guide catheter without side holes was used. A short Luge wire was  used. The wire was placed down the optional diagonal and a 2.5 x 18 Mini  Vision stent was used for direct stenting. After making sure that both the  proximal and distal segments of the lesion were well covered, the balloon  was initially deployed at 14 atmospheres for 50 seconds with a final  inflation at 14 atmospheres for 60 seconds. The area that had been 80% plus  narrowed preintervention now appeared to be normal. There was brisk  TIMI-III  flow pre and post. No evidence of any dissection or thrombus formation.   The patient complained of more severe chest pain, but the chest pain was  clearly the characteristic pain he had had with physical exertion.   He was given double-bolus Integralin and heparin 5500 units and the initial  ACT at the beginning of the procedure was 307. The final ACT is 266.   Visipaque was used because of a creatinine of 1.4. We will recheck his BUN  and creatinine in the morning. Of note, the patient will be given 600 mg of  oral Plavix.       ABL/MEDQ  D:  01/03/2005  T:  01/03/2005  Job:  [id number]   cc:   Tammi Sou, MD  Fax: [phone]   Catheterization Laboratory

## 2010-11-10 NOTE — Discharge Summary (Signed)
NAME:  Terry Garner, Terry Garner               ACCOUNT NO.:  [id number]   MEDICAL RECORD NO.:  [id number]          PATIENT TYPE:  INP   LOCATION:  6527                         FACILITY:  [facility]   PHYSICIAN:  Jeanella Craze. Little, M.D. DATE OF BIRTH:  20-Feb-1941   DATE OF ADMISSION:  12/13/2005  DATE OF DISCHARGE:  12/14/2005                                 DISCHARGE SUMMARY   DISCHARGE DIAGNOSIS:  1.  Coronary artery disease with an abnormal preoperative clearance      Cardiolite study, catheterization this admission revealing 70% left      anterior descending that was dilated and stented with a 9 DES stent by      Dr. Rex Kras.  2.  Prior 9 DES stent to an optional diagonal stenosis in July 2006.  3.  Treated hypertension.  4.  Dyslipidemia.  5.  New right bundle branch block, transient.  6.  Arthritis.   HOSPITAL COURSE:  The patient is a 70 year old male who was scheduled to  have a hip replacement for arthritis.  Cardiolite study was abnormal with  anterior ischemia.  He was admitted for elective catheterization which was  done December 13, 2005, by Dr. Rex Kras.  This revealed no restenosis to the  diagonal and a proximal 70% stenosis.  The LAD stenosis was dilated and  stented with a 9 DES stent.  He tolerated the procedure well.  The patient  did have transient right bundle branch block during this admission.  CK MBs  were negative.  Creatinine was 1.5.  Dr. Rex Kras feels he can be discharged  December 14, 2005, and have hip replacement in eight weeks.  We are going to  keep him on Plavix until that time.  He will see him as an outpatient.   DISCHARGE MEDICATIONS:  1.  Plavix 75 mg a day.  2.  Quinapril 40 mg a day.  3.  HCTZ 25 mg a day.  4.  Aspirin 81 mg a day.  5.  Allopurinol 150 mg a day.  6.  [place] 200 mg twice a day.  7.  Crestor 5 mg a day.  8.  Nitroglycerin sublingual p.r.n.   LABS:  Sodium 139, potassium 4.5, BUN 23, creatinine 1.5. EKG shows sinus  rhythm with a right bundle branch  block.   DISPOSITION:  The patient is discharged in stable condition and will follow-  up with Dr. Rex Kras in a couple of weeks in the office.      Erlene Quan, P.A.    ______________________________  Jeanella Craze. Little, M.D.    Meryl Dare  D:  01/09/2006  T:  01/09/2006  Job:  [id number]   cc:   [name], M.D.  Fax: [phone]   Thana Farr. Lorin Mercy, M.D.  Fax: [phone]

## 2011-04-09 LAB — APTT: aPTT: 26

## 2011-04-09 LAB — HEMOGLOBIN A1C: Mean Plasma Glucose: 119

## 2011-04-09 LAB — COMPREHENSIVE METABOLIC PANEL
ALT: 20
CO2: 23
Calcium: 8.8
Creatinine, Ser: 1.77 — ABNORMAL HIGH
GFR calc non Af Amer: 38 — ABNORMAL LOW
Glucose, Bld: 98

## 2011-04-09 LAB — DIFFERENTIAL
Eosinophils Absolute: 0.3
Lymphocytes Relative: 33
Lymphs Abs: 1.1
Monocytes Absolute: 0.3
Monocytes Relative: 6
Neutro Abs: 2.9
Neutrophils Relative %: 55
Neutrophils Relative %: 63

## 2011-04-09 LAB — CBC
HCT: 33.8 — ABNORMAL LOW
Hemoglobin: 11.5 — ABNORMAL LOW
Hemoglobin: 11.7 — ABNORMAL LOW
MCHC: 34.7
MCV: 87.3
MCV: 88.7
Platelets: 150
RBC: 3.73 — ABNORMAL LOW
RBC: 3.8 — ABNORMAL LOW
RBC: 3.87 — ABNORMAL LOW
WBC: 5.2
WBC: 5.7

## 2011-04-09 LAB — BASIC METABOLIC PANEL
CO2: 25
CO2: 28
Calcium: 8.9
Chloride: 109
Creatinine, Ser: 1.81 — ABNORMAL HIGH
GFR calc Af Amer: 45 — ABNORMAL LOW
Glucose, Bld: 104 — ABNORMAL HIGH
Potassium: 5
Sodium: 138
Sodium: 138

## 2011-04-09 LAB — LIPID PANEL
Cholesterol: 159
HDL: 33 — ABNORMAL LOW
LDL Cholesterol: 86
Triglycerides: 196 — ABNORMAL HIGH
VLDL: 45 — ABNORMAL HIGH

## 2011-04-09 LAB — CARDIAC PANEL(CRET KIN+CKTOT+MB+TROPI)
CK, MB: 0.9
CK, MB: 1
CK, MB: 1.1
CK, MB: 1.2
Relative Index: INVALID
Relative Index: INVALID
Total CK: 52
Total CK: 60
Troponin I: 0.08 — ABNORMAL HIGH

## 2011-04-09 LAB — POCT CARDIAC MARKERS
CKMB, poc: <1 — ABNORMAL LOW
CKMB, poc: <1 — ABNORMAL LOW
Myoglobin, poc: 95.3
Operator id: 279831
Operator id: 279831
Operator id: 279831
Troponin i, poc: <0.05
Troponin i, poc: <0.05
Troponin i, poc: <0.05

## 2011-04-09 LAB — URINALYSIS, ROUTINE W REFLEX MICROSCOPIC
Bilirubin Urine: NEGATIVE
Ketones, ur: NEGATIVE
Nitrite: NEGATIVE
pH: 7

## 2011-04-09 LAB — CK TOTAL AND CKMB (NOT AT ARMC): CK, MB: 1.2

## 2011-04-09 LAB — HEPARIN LEVEL (UNFRACTIONATED)
Heparin Unfractionated: 0.65
Heparin Unfractionated: <0.1 — ABNORMAL LOW

## 2011-04-09 LAB — PROTIME-INR: INR: 1

## 2011-04-09 LAB — TROPONIN I: Troponin I: 0.01

## 2011-05-01 ENCOUNTER — Other Ambulatory Visit: Payer: Self-pay | Admitting: Internal Medicine

## 2011-05-01 DIAGNOSIS — R7989 Other specified abnormal findings of blood chemistry: Secondary | ICD-10-CM

## 2011-05-01 DIAGNOSIS — I10 Essential (primary) hypertension: Secondary | ICD-10-CM

## 2011-05-04 ENCOUNTER — Ambulatory Visit
Admission: RE | Admit: 2011-05-04 | Discharge: 2011-05-04 | Disposition: A | Discharge status: Home / Self Care | Payer: Medicare Other | Source: Ambulatory Visit | Attending: Internal Medicine | Admitting: Internal Medicine

## 2011-05-04 DIAGNOSIS — R7989 Other specified abnormal findings of blood chemistry: Secondary | ICD-10-CM

## 2011-05-04 DIAGNOSIS — I10 Essential (primary) hypertension: Secondary | ICD-10-CM

## 2011-10-29 ENCOUNTER — Encounter (INDEPENDENT_AMBULATORY_CARE_PROVIDER_SITE_OTHER): Payer: Medicare Other | Admitting: Ophthalmology

## 2011-10-29 DIAGNOSIS — H35039 Hypertensive retinopathy, unspecified eye: Secondary | ICD-10-CM

## 2011-10-29 DIAGNOSIS — H2510 Age-related nuclear cataract, unspecified eye: Secondary | ICD-10-CM

## 2011-10-29 DIAGNOSIS — H43819 Vitreous degeneration, unspecified eye: Secondary | ICD-10-CM

## 2011-10-29 DIAGNOSIS — I10 Essential (primary) hypertension: Secondary | ICD-10-CM

## 2011-10-29 DIAGNOSIS — H348192 Central retinal vein occlusion, unspecified eye, stable: Secondary | ICD-10-CM

## 2012-01-04 ENCOUNTER — Other Ambulatory Visit: Payer: Self-pay | Admitting: Internal Medicine

## 2012-01-04 DIAGNOSIS — I10 Essential (primary) hypertension: Secondary | ICD-10-CM

## 2012-01-09 ENCOUNTER — Ambulatory Visit
Admission: RE | Admit: 2012-01-09 | Discharge: 2012-01-09 | Disposition: A | Discharge status: Home / Self Care | Payer: Medicare Other | Source: Ambulatory Visit | Attending: Internal Medicine | Admitting: Internal Medicine

## 2012-01-09 DIAGNOSIS — I10 Essential (primary) hypertension: Secondary | ICD-10-CM

## 2012-02-28 ENCOUNTER — Other Ambulatory Visit: Payer: Self-pay | Admitting: Internal Medicine

## 2012-03-05 ENCOUNTER — Other Ambulatory Visit: Payer: Self-pay | Admitting: Orthopaedic Surgery

## 2012-03-05 DIAGNOSIS — M545 Low back pain, unspecified: Secondary | ICD-10-CM

## 2012-03-06 ENCOUNTER — Other Ambulatory Visit (HOSPITAL_COMMUNITY): Payer: Self-pay | Admitting: Orthopaedic Surgery

## 2012-03-06 DIAGNOSIS — M25552 Pain in left hip: Secondary | ICD-10-CM

## 2012-03-08 ENCOUNTER — Ambulatory Visit
Admission: RE | Admit: 2012-03-08 | Discharge: 2012-03-08 | Disposition: A | Discharge status: Home / Self Care | Payer: Medicare Other | Source: Ambulatory Visit | Attending: Orthopaedic Surgery | Admitting: Orthopaedic Surgery

## 2012-03-08 DIAGNOSIS — M545 Low back pain, unspecified: Secondary | ICD-10-CM

## 2012-03-17 ENCOUNTER — Encounter (HOSPITAL_COMMUNITY)
Admission: RE | Admit: 2012-03-17 | Discharge: 2012-03-17 | Disposition: A | Discharge status: Home / Self Care | Payer: Medicare Other | Source: Ambulatory Visit | Attending: Orthopaedic Surgery | Admitting: Orthopaedic Surgery

## 2012-03-17 ENCOUNTER — Ambulatory Visit (HOSPITAL_COMMUNITY)
Admission: RE | Admit: 2012-03-17 | Discharge: 2012-03-17 | Disposition: A | Discharge status: Home / Self Care | Payer: Medicare Other | Source: Ambulatory Visit | Attending: Orthopaedic Surgery | Admitting: Orthopaedic Surgery

## 2012-03-17 DIAGNOSIS — M25552 Pain in left hip: Secondary | ICD-10-CM

## 2012-03-17 DIAGNOSIS — M25559 Pain in unspecified hip: Secondary | ICD-10-CM | POA: Insufficient documentation

## 2012-03-17 DIAGNOSIS — Z96649 Presence of unspecified artificial hip joint: Secondary | ICD-10-CM | POA: Insufficient documentation

## 2012-03-17 MED ORDER — TECHNETIUM TC 99M MEDRONATE IV KIT
25.0000 | PACK | Freq: Once | INTRAVENOUS | Status: AC | PRN
  Administered 2012-03-17: 25 via INTRAVENOUS

## 2012-05-30 ENCOUNTER — Encounter (HOSPITAL_COMMUNITY): Payer: Self-pay | Admitting: Pharmacy Technician

## 2012-06-02 ENCOUNTER — Other Ambulatory Visit (HOSPITAL_COMMUNITY): Payer: Self-pay | Admitting: Orthopaedic Surgery

## 2012-06-03 ENCOUNTER — Other Ambulatory Visit (HOSPITAL_COMMUNITY): Payer: Self-pay | Admitting: Orthopaedic Surgery

## 2012-06-04 ENCOUNTER — Encounter (HOSPITAL_COMMUNITY)
Admission: RE | Admit: 2012-06-04 | Discharge: 2012-06-04 | Disposition: A | Discharge status: Home / Self Care | Payer: Medicare Other | Source: Ambulatory Visit | Attending: Orthopaedic Surgery | Admitting: Orthopaedic Surgery

## 2012-06-04 ENCOUNTER — Encounter (HOSPITAL_COMMUNITY): Payer: Self-pay

## 2012-06-04 HISTORY — DX: Atherosclerotic heart disease of native coronary artery without angina pectoris: I25.10

## 2012-06-04 HISTORY — DX: Essential (primary) hypertension: I10

## 2012-06-04 HISTORY — DX: Unspecified osteoarthritis, unspecified site: M19.90

## 2012-06-04 HISTORY — DX: Cardiac murmur, unspecified: R01.1

## 2012-06-04 LAB — COMPREHENSIVE METABOLIC PANEL
AST: 24 U/L (ref 0–37)
GFR calc non Af Amer: 25 mL/min — ABNORMAL LOW (ref >90)
Glucose, Bld: 95 mg/dL (ref 70–99)
Potassium: 4.2 mEq/L (ref 3.5–5.1)
Sodium: 143 mEq/L (ref 135–145)
Total Bilirubin: 0.5 mg/dL (ref 0.3–1.2)
Total Protein: 7 g/dL (ref 6.0–8.3)

## 2012-06-04 LAB — URINALYSIS, ROUTINE W REFLEX MICROSCOPIC
Bilirubin Urine: NEGATIVE
Ketones, ur: NEGATIVE mg/dL
Leukocytes, UA: NEGATIVE
Nitrite: NEGATIVE
Urobilinogen, UA: 0.2 mg/dL (ref 0.0–1.0)

## 2012-06-04 LAB — CBC
HCT: 38.2 % — ABNORMAL LOW (ref 39.0–52.0)
MCHC: 33.8 g/dL (ref 30.0–36.0)
MCV: 87.4 fL (ref 78.0–100.0)
RDW: 14 % (ref 11.5–15.5)
WBC: 7.4 10*3/uL (ref 4.0–10.5)

## 2012-06-04 LAB — PROTIME-INR
INR: 1.11 (ref 0.00–1.49)
Prothrombin Time: 14.2 seconds (ref 11.6–15.2)

## 2012-06-04 NOTE — Progress Notes (Signed)
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

## 2012-06-04 NOTE — Progress Notes (Signed)
req'd notes any tests from [facility]

## 2012-06-04 NOTE — Pre-Procedure Instructions (Addendum)
Terry Garner  06/04/2012   Your procedure is scheduled on:  06/06/12  Report to [facility] at 530 AM.  Call this number if you have problems the morning of surgery: [phone]   Remember:   Do not eat food:or drinkAfter Midnight.    Take these medicines the morning of surgery with A SIP OF WATER: allopurinol, imdur,  STOP flaxseed, omega 3, aspirin    And plavix per dr   Lazaro Arms not wear jewelry,   Do not wear lotions, powders, or perfumes. You may not wear deodorant.  Do not shave 48 hours prior to surgery. Men may shave face and neck.  Do not bring valuables to the hospital.  Contacts, dentures or bridgework may not be worn into surgery.  Leave suitcase in the car. After surgery it may be brought to your room.  For patients admitted to the hospital, checkout time is 11:00 AM the day of discharge.   Patients discharged the day of surgery will not be allowed to drive home.  Name and phone number of your driver [name]  Special Instructions: Shower using CHG 2 nights before surgery and the night before surgery.  If you shower the day of surgery use CHG.  Use special wash - you have one bottle of CHG for all showers.  You should use approximately 1/3 of the bottle for each shower.   Please read over the following fact sheets that you were given: Pain Booklet, Coughing and Deep Breathing, MRSA Information and Surgical Site Infection Prevention

## 2012-06-05 ENCOUNTER — Encounter (HOSPITAL_COMMUNITY): Payer: Self-pay | Admitting: Vascular Surgery

## 2012-06-05 MED ORDER — CEFAZOLIN SODIUM-DEXTROSE 2-3 GM-% IV SOLR
2.0000 g | INTRAVENOUS | Status: AC
  Administered 2012-06-06: 2 g via INTRAVENOUS
  Filled 2012-06-05: qty 50

## 2012-06-05 NOTE — H&P (Signed)
[facility]   A Division of [employer]   [address], [place], [place] [address] Telephone: [phone]  Fax: [phone]     PATIENT: Terry Garner   MR#: [id number]  DOB: 09-20-1940       A 71 year old male returns and states the low back and left leg is still hurting and "I want to proceed with surgery."     A 71 year old male who has had problems with his back since he was picking up some pizzas that had fallen to the ground in a basket to toss them out and had the inability to resume an erect position after he had been bent over.  He has had difficulty walking, difficulty getting in an upright position and persistent back and leg pain.  He was seen on 12/18/2011 and has been treated with anti-inflammatories, pain medication, Medrol Dosepak, muscle relaxants and epidural steroid injections.  The MRI on 03/10/2012 showed large posterolateral left disk herniation with caudal downturn, facet hypertrophy and stenosis of the lateral recess with compression on the left L5 nerve root.  He has weakness in his leg, which has been persistent.  He has had epidural injections on 03/25/2012 and 04/15/2012 and states that they gave him some slight improvement, but his pain has returned and he wants to proceed with operative intervention.    CURRENT MEDICATIONS:  Vitamin D 8000 units daily, isosorbide mononitrate 30 mg in the a.m. and p.m., allopurinol 300 mg p.o. daily for his gout, Bayer aspirin 81 mg daily, clopidogrel 75 mg daily, pravastatin 40 mg daily, flax oil 1200 mg daily, fish oil 1000 mg daily, Travatan eye drops in both eyes at night, enalapril 5 mg and fenofibrate 134 mg p.o. daily.     PAST SURGICAL HISTORY:  The patient has a Guidant coronary stent, which is MRI compatible.  He has had a total of 3 heart stents and none in the last couple of year.  He had previous shoulder arthroscopy with labral procedure in 2010 and did well.  He has been  followed by Dr. Rex Kras in the past at [facility] and [place].  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     SOCIAL HISTORY:  The patient is married to his wife, Vickii Chafe.  He is retired and does not smoke or drink.     FAMILY HISTORY:  Positive for hypertension.     REVIEW OF SYSTEMS:  Positive for arthritis, total hip arthroplasty, AVN of his hip, rotator cuff surgery, history of gout, glaucoma, coronary artery disease, hypertension and cataracts.    PHYSICAL EXAMINATION:  The patient is 6 feet 3 inches and weighs 220 pounds.  He is alert and oriented well developed, well nourished,and in no acute distress.  Extraocular movements intact.  Good visual acuity with glasses.  No carotid bruits.  Lungs are clear to auscultation.  No accessory muscle inspiratory effort.  No JVD.  No brachial plexus tenderness.  The abdomen is nontender.  Reflexes are intact.  No pain with hip range of motion.  He has trace anterior tib weakness.  Pain with straight leg raising at 70 degrees on the left and negative on the right.  Gastrocsoleus is strong.  Distal pulses are 2+.  Peroneals and posterior tib are active, good resisted strength.  Decreased sensation on the dorsum of the foot.  Normal over the plantar surface.     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  PLAN:  We discussed options with the patient.  He would need to be off of his Plavix and also off of aspirin for surgery.  Cardiology preoperative visit needed.  The risk of surgery discussed including re-operation, possibility of later fusion.  He will need to see a cardiologist for preoperative approval.  He denies any ongoing angina, shortness of breath, dyspnea or orthopnea.  Once this is cleared we can proceed with surgical scheduling at his request.     For additional information please see handwritten notes, reports, orders and prescriptions in this chart.      Isma Tietje C. Lorin Mercy, M.D.    Auto-Authenticated by  Thana Farr. Lorin Mercy, M.D.  Addendum:  Pt seen by DR [facility] and cleared for surgical intervention from a cardiac standpoint.  [facility]

## 2012-06-05 NOTE — Consult Note (Signed)
Anesthesia chart review: Patient is a 71 year old male scheduled for left lumbar 45 microdiscectomy by Dr. Lorin Mercy on 06/06/2012. History includes nonsmoker, CAD, hypertension, arthritis, CKD with renal artery stenos (dopplers on 01/25/12 suggested 1-59% bilaterally), small AAA (3.5 X 3.6 cm by notes, but only 3.22 X 3.17 by doppler on 01/25/12), prior joint and cervical disc surgeries.  Dr. [name].    Cardiologist is Dr. Rex Kras [facility]), who recently retired.  His last notes is from 01/21/12 and indicated that patient had been asymptomatic since his previous visit.  There was discussion of having him evaluated by Dr. Quay Burow for a CO2 renal angiogram to further evaluate the degree of patient's renal artery stenosis.  It doesn't appear that this was done; however, he did have a repeat renal doppler study at [facility] since then (01/25/12) that showed bilateral 1-59% renal artery stenosis and one year follow-up was recommended.   Notes indicate that his was ultimately cleared for this procedure by Dr. Rollene Fare (note on chart).    EKG on 06/04/2012 showed sinus bradycardia with marked sinus arrhythmia, right bundle branch block.  Cardiac cath on 06/09/2009 showed normal left ventricular function, mild coronary obstructive disease without significant restenosis at the prior intervention sites with evidence of 20% mild narrowing within the proximal LAD artery stent, no restenosis in the mid-diagonal stent, and luminal irregularities of the LAD. Widely patent stent in the ramus intermediate vessel, dominant left circumflex coronary with 30% smooth ostial narrowing with 40% proximal narrowing prior to the obtuse marginal 1 takeoff. Nondominant RCA with smooth 20% proximal narrowing. Medical therapy was recommended.  Echo on 06/07/09 showed moderate concentric LVH, normal LV systolic function, EF [id number], normal wall motion, findings consistent with grade 1 diastolic dysfunction. Mild aortic stenosis, trivial  aortic regurgitation, Valve area: 1.92 cm\S\2 (VTI). Valve area: 1.72 cm\S\2 (Vmax). Mild mitral valve regurgitation. The left atrial appendage was mildly to moderately dilated.  His last nuclear stress test on 11/11/06 showed no significant ischemia, EF 66%.  Chest x-ray on 06/04/2012 showed no active cardiopulmonary abnormalities.  Pre-operative labs noted.  BUN/Cr 34/2.42.  Glucose 95.  H/H 12.9/38.2.  PT/PTT WNL.  UA WNL.   Comparison labs reviewed from his PCP.  His Cr on 02/28/12 was 2.18 and 2.31 on 01/02/12.  His renal insufficiency does not appear new, but he has had progressive elevation in his creatinine over the past six months.  Will repeat his BMET on arrival to ensure stability.  I did notify Sherrie at Dr. Lorin Mercy office of renal function results.  I reviewed above with Anesthesiologist Dr. Marcie Bal.  If follow-up labs are stable and no significant change in his status then anticipate patient can proceed as planned.  Myra Gianotti, PA-C 06/05/12 1530

## 2012-06-06 ENCOUNTER — Encounter (HOSPITAL_COMMUNITY): Payer: Self-pay | Admitting: *Deleted

## 2012-06-06 ENCOUNTER — Ambulatory Visit (HOSPITAL_COMMUNITY): Payer: Medicare Other | Admitting: Vascular Surgery

## 2012-06-06 ENCOUNTER — Encounter (HOSPITAL_COMMUNITY)
Admission: RE | Disposition: A | Discharge status: Home / Self Care | Payer: Self-pay | Source: Ambulatory Visit | Attending: Orthopaedic Surgery

## 2012-06-06 ENCOUNTER — Encounter (HOSPITAL_COMMUNITY): Payer: Self-pay | Admitting: Vascular Surgery

## 2012-06-06 ENCOUNTER — Observation Stay (HOSPITAL_COMMUNITY)
Admission: RE | Admit: 2012-06-06 | Discharge: 2012-06-07 | Disposition: A | Discharge status: Home / Self Care | Payer: Medicare Other | Source: Ambulatory Visit | Attending: Orthopaedic Surgery | Admitting: Orthopaedic Surgery

## 2012-06-06 ENCOUNTER — Ambulatory Visit (HOSPITAL_COMMUNITY): Payer: Medicare Other

## 2012-06-06 ENCOUNTER — Encounter (HOSPITAL_COMMUNITY): Payer: Self-pay | Admitting: Anesthesiology

## 2012-06-06 DIAGNOSIS — N289 Disorder of kidney and ureter, unspecified: Secondary | ICD-10-CM | POA: Insufficient documentation

## 2012-06-06 DIAGNOSIS — I2510 Atherosclerotic heart disease of native coronary artery without angina pectoris: Secondary | ICD-10-CM | POA: Insufficient documentation

## 2012-06-06 DIAGNOSIS — M5126 Other intervertebral disc displacement, lumbar region: Principal | ICD-10-CM | POA: Insufficient documentation

## 2012-06-06 DIAGNOSIS — Z95810 Presence of automatic (implantable) cardiac defibrillator: Secondary | ICD-10-CM | POA: Insufficient documentation

## 2012-06-06 DIAGNOSIS — I10 Essential (primary) hypertension: Secondary | ICD-10-CM | POA: Insufficient documentation

## 2012-06-06 DIAGNOSIS — Z9861 Coronary angioplasty status: Secondary | ICD-10-CM | POA: Insufficient documentation

## 2012-06-06 DIAGNOSIS — Z96649 Presence of unspecified artificial hip joint: Secondary | ICD-10-CM | POA: Insufficient documentation

## 2012-06-06 DIAGNOSIS — Z01812 Encounter for preprocedural laboratory examination: Secondary | ICD-10-CM | POA: Insufficient documentation

## 2012-06-06 DIAGNOSIS — Z01810 Encounter for preprocedural cardiovascular examination: Secondary | ICD-10-CM | POA: Insufficient documentation

## 2012-06-06 DIAGNOSIS — Z01818 Encounter for other preprocedural examination: Secondary | ICD-10-CM | POA: Insufficient documentation

## 2012-06-06 HISTORY — PX: LUMBAR LAMINECTOMY: SHX95

## 2012-06-06 LAB — BASIC METABOLIC PANEL
BUN: 29 mg/dL — ABNORMAL HIGH (ref 6–23)
CO2: 26 mEq/L (ref 19–32)
Calcium: 9.5 mg/dL (ref 8.4–10.5)
Creatinine, Ser: 2.23 mg/dL — ABNORMAL HIGH (ref 0.50–1.35)
Glucose, Bld: 101 mg/dL — ABNORMAL HIGH (ref 70–99)

## 2012-06-06 SURGERY — MICRODISCECTOMY LUMBAR LAMINECTOMY
Anesthesia: General | Site: Back | Laterality: Left | Wound class: Clean

## 2012-06-06 MED ORDER — BUPIVACAINE HCL (PF) 0.25 % IJ SOLN
INTRAMUSCULAR | Status: DC | PRN
  Administered 2012-06-06: 7 mL

## 2012-06-06 MED ORDER — ISOSORBIDE MONONITRATE ER 30 MG PO TB24
30.0000 mg | ORAL_TABLET | Freq: Two times a day (BID) | ORAL | Status: DC
Start: 2012-06-06 — End: 2012-06-07
  Administered 2012-06-06 – 2012-06-07 (×2): 30 mg via ORAL
  Filled 2012-06-06 (×3): qty 1

## 2012-06-06 MED ORDER — FENTANYL CITRATE 0.05 MG/ML IJ SOLN
INTRAMUSCULAR | Status: DC | PRN
  Administered 2012-06-06: 100 ug via INTRAVENOUS
  Administered 2012-06-06: 150 ug via INTRAVENOUS

## 2012-06-06 MED ORDER — VITAMIN D3 25 MCG (1000 UNIT) PO TABS
2000.0000 [IU] | ORAL_TABLET | Freq: Two times a day (BID) | ORAL | Status: DC
  Administered 2012-06-06 – 2012-06-07 (×2): 2000 [IU] via ORAL
  Filled 2012-06-06 (×3): qty 2

## 2012-06-06 MED ORDER — ACETAMINOPHEN 650 MG RE SUPP: 650.0000 mg | RECTAL | Status: DC | PRN

## 2012-06-06 MED ORDER — SODIUM CHLORIDE 0.9 % IV SOLN: 250.0000 mL | INTRAVENOUS | Status: DC

## 2012-06-06 MED ORDER — ONDANSETRON HCL 4 MG/2ML IJ SOLN
INTRAMUSCULAR | Status: DC | PRN
  Administered 2012-06-06: 4 mg via INTRAVENOUS

## 2012-06-06 MED ORDER — OXYCODONE-ACETAMINOPHEN 5-325 MG PO TABS
1.0000 | ORAL_TABLET | ORAL | Status: DC | PRN
Start: 2012-06-06 — End: 2012-06-07

## 2012-06-06 MED ORDER — ASPIRIN EC 81 MG PO TBEC: 81.0000 mg | DELAYED_RELEASE_TABLET | Freq: Every day | ORAL | Status: DC

## 2012-06-06 MED ORDER — MIDAZOLAM HCL 2 MG/2ML IJ SOLN: 0.5000 mg | Freq: Once | INTRAMUSCULAR | Status: DC | PRN

## 2012-06-06 MED ORDER — FENOFIBRATE 54 MG PO TABS
54.0000 mg | ORAL_TABLET | Freq: Every day | ORAL | Status: DC
  Administered 2012-06-06 – 2012-06-07 (×2): 54 mg via ORAL
  Filled 2012-06-06 (×2): qty 1

## 2012-06-06 MED ORDER — SODIUM CHLORIDE 0.9 % IJ SOLN: 3.0000 mL | Freq: Two times a day (BID) | INTRAMUSCULAR | Status: DC

## 2012-06-06 MED ORDER — VITAMIN D3 50 MCG (2000 UT) PO TABS: 1.0000 | ORAL_TABLET | Freq: Two times a day (BID) | ORAL | Status: DC

## 2012-06-06 MED ORDER — ASPIRIN EC 81 MG PO TBEC
81.0000 mg | DELAYED_RELEASE_TABLET | Freq: Every day | ORAL | Status: DC
  Administered 2012-06-06 – 2012-06-07 (×2): 81 mg via ORAL
  Filled 2012-06-06 (×2): qty 1

## 2012-06-06 MED ORDER — CLOPIDOGREL BISULFATE 75 MG PO TABS
75.0000 mg | ORAL_TABLET | Freq: Every day | ORAL | Status: DC
Start: 2012-06-06 — End: 2012-06-07
  Administered 2012-06-06 – 2012-06-07 (×2): 75 mg via ORAL
  Filled 2012-06-06 (×2): qty 1

## 2012-06-06 MED ORDER — 0.9 % SODIUM CHLORIDE (POUR BTL) OPTIME
TOPICAL | Status: DC | PRN
  Administered 2012-06-06: 1000 mL

## 2012-06-06 MED ORDER — GLYCOPYRROLATE 0.2 MG/ML IJ SOLN
INTRAMUSCULAR | Status: DC | PRN
  Administered 2012-06-06: 0.2 mg via INTRAVENOUS
  Administered 2012-06-06: 0.6 mg via INTRAVENOUS

## 2012-06-06 MED ORDER — POTASSIUM CHLORIDE IN NACL 20-0.45 MEQ/L-% IV SOLN
INTRAVENOUS | Status: DC
  Administered 2012-06-06: 16:00:00 via INTRAVENOUS
  Filled 2012-06-06 (×4): qty 1000

## 2012-06-06 MED ORDER — PROMETHAZINE HCL 25 MG/ML IJ SOLN: 6.2500 mg | INTRAMUSCULAR | Status: DC | PRN

## 2012-06-06 MED ORDER — OMEGA-3-ACID ETHYL ESTERS 1 G PO CAPS
1.0000 g | ORAL_CAPSULE | Freq: Every day | ORAL | Status: DC
  Administered 2012-06-07: 1 g via ORAL
  Filled 2012-06-06 (×2): qty 1

## 2012-06-06 MED ORDER — PROPOFOL 10 MG/ML IV BOLUS
INTRAVENOUS | Status: DC | PRN
  Administered 2012-06-06: 150 mg via INTRAVENOUS

## 2012-06-06 MED ORDER — ARTIFICIAL TEARS OP OINT
TOPICAL_OINTMENT | OPHTHALMIC | Status: DC | PRN
  Administered 2012-06-06: 1 via OPHTHALMIC

## 2012-06-06 MED ORDER — METHOCARBAMOL 500 MG PO TABS
500.0000 mg | ORAL_TABLET | Freq: Four times a day (QID) | ORAL | Status: DC | PRN
  Filled 2012-06-06: qty 1

## 2012-06-06 MED ORDER — OXYCODONE HCL 5 MG/5ML PO SOLN: 5.0000 mg | Freq: Once | ORAL | Status: DC | PRN

## 2012-06-06 MED ORDER — ROCURONIUM BROMIDE 100 MG/10ML IV SOLN
INTRAVENOUS | Status: DC | PRN
  Administered 2012-06-06: 50 mg via INTRAVENOUS

## 2012-06-06 MED ORDER — METHOCARBAMOL 100 MG/ML IJ SOLN
500.0000 mg | Freq: Four times a day (QID) | INTRAVENOUS | Status: DC | PRN
  Administered 2012-06-06: 500 mg via INTRAVENOUS
  Filled 2012-06-06: qty 5

## 2012-06-06 MED ORDER — SIMVASTATIN 5 MG PO TABS
5.0000 mg | ORAL_TABLET | Freq: Every day | ORAL | Status: DC
  Administered 2012-06-06: 5 mg via ORAL
  Filled 2012-06-06 (×2): qty 1

## 2012-06-06 MED ORDER — MENTHOL 3 MG MT LOZG: 1.0000 | LOZENGE | OROMUCOSAL | Status: DC | PRN

## 2012-06-06 MED ORDER — MORPHINE SULFATE 2 MG/ML IJ SOLN
1.0000 mg | INTRAMUSCULAR | Status: DC | PRN
Start: 2012-06-06 — End: 2012-06-07

## 2012-06-06 MED ORDER — KETOROLAC TROMETHAMINE 30 MG/ML IJ SOLN: 30.0000 mg | Freq: Once | INTRAMUSCULAR | Status: DC

## 2012-06-06 MED ORDER — PHENYLEPHRINE HCL 10 MG/ML IJ SOLN
INTRAMUSCULAR | Status: DC | PRN
  Administered 2012-06-06 (×3): 40 ug via INTRAVENOUS

## 2012-06-06 MED ORDER — BUPIVACAINE HCL (PF) 0.25 % IJ SOLN
INTRAMUSCULAR | Status: AC
  Filled 2012-06-06: qty 30

## 2012-06-06 MED ORDER — KCL IN DEXTROSE-NACL 20-5-0.45 MEQ/L-%-% IV SOLN
INTRAVENOUS | Status: DC
  Filled 2012-06-06: qty 1000

## 2012-06-06 MED ORDER — LACTATED RINGERS IV SOLN
INTRAVENOUS | Status: DC | PRN
  Administered 2012-06-06 (×2): via INTRAVENOUS

## 2012-06-06 MED ORDER — OXYCODONE-ACETAMINOPHEN 5-325 MG PO TABS: 2.0000 | ORAL_TABLET | ORAL | Status: DC | PRN

## 2012-06-06 MED ORDER — NEOSTIGMINE METHYLSULFATE 1 MG/ML IJ SOLN
INTRAMUSCULAR | Status: DC | PRN
  Administered 2012-06-06: 4 mg via INTRAVENOUS

## 2012-06-06 MED ORDER — ALUM & MAG HYDROXIDE-SIMETH 200-200-20 MG/5ML PO SUSP: 30.0000 mL | Freq: Four times a day (QID) | ORAL | Status: DC | PRN

## 2012-06-06 MED ORDER — LIDOCAINE HCL (CARDIAC) 20 MG/ML IV SOLN
INTRAVENOUS | Status: DC | PRN
  Administered 2012-06-06: 20 mg via INTRAVENOUS

## 2012-06-06 MED ORDER — OXYCODONE HCL 5 MG PO TABS: 5.0000 mg | ORAL_TABLET | Freq: Once | ORAL | Status: DC | PRN

## 2012-06-06 MED ORDER — SODIUM CHLORIDE 0.9 % IJ SOLN: 3.0000 mL | INTRAMUSCULAR | Status: DC | PRN

## 2012-06-06 MED ORDER — ACETAMINOPHEN 325 MG PO TABS: 650.0000 mg | ORAL_TABLET | ORAL | Status: DC | PRN

## 2012-06-06 MED ORDER — ONDANSETRON HCL 4 MG/2ML IJ SOLN: 4.0000 mg | INTRAMUSCULAR | Status: DC | PRN

## 2012-06-06 MED ORDER — PHENOL 1.4 % MT LIQD: 1.0000 | OROMUCOSAL | Status: DC | PRN

## 2012-06-06 MED ORDER — METHOCARBAMOL 500 MG PO TABS: 500.0000 mg | ORAL_TABLET | Freq: Three times a day (TID) | ORAL | Status: DC

## 2012-06-06 MED ORDER — ISOSORBIDE MONONITRATE ER 30 MG PO TB24
30.0000 mg | ORAL_TABLET | Freq: Two times a day (BID) | ORAL | Status: DC
  Filled 2012-06-06: qty 1

## 2012-06-06 MED ORDER — MEPERIDINE HCL 25 MG/ML IJ SOLN: 6.2500 mg | INTRAMUSCULAR | Status: DC | PRN

## 2012-06-06 MED ORDER — ALLOPURINOL 150 MG HALF TABLET
150.0000 mg | ORAL_TABLET | Freq: Every day | ORAL | Status: DC
  Administered 2012-06-07: 150 mg via ORAL
  Filled 2012-06-06: qty 1

## 2012-06-06 MED ORDER — HYDROMORPHONE HCL PF 1 MG/ML IJ SOLN: 0.2500 mg | INTRAMUSCULAR | Status: DC | PRN

## 2012-06-06 SURGICAL SUPPLY — 47 items
APL SKNCLS STERI-STRIP NONHPOA (GAUZE/BANDAGES/DRESSINGS) ×1
BENZOIN TINCTURE PRP APPL 2/3 (GAUZE/BANDAGES/DRESSINGS) ×2 IMPLANT
BUR ROUND FLUTED 4 SOFT TCH (BURR) IMPLANT
CLOTH BEACON ORANGE TIMEOUT ST (SAFETY) ×2 IMPLANT
CORDS BIPOLAR (ELECTRODE) ×2 IMPLANT
COVER SURGICAL LIGHT HANDLE (MISCELLANEOUS) ×2 IMPLANT
DRAPE MICROSCOPE LEICA (MISCELLANEOUS) ×2 IMPLANT
DRAPE PROXIMA HALF (DRAPES) ×4 IMPLANT
DRSG EMULSION OIL 3X3 NADH (GAUZE/BANDAGES/DRESSINGS) ×2 IMPLANT
DURAPREP 26ML APPLICATOR (WOUND CARE) ×2 IMPLANT
ELECT REM PT RETURN 9FT ADLT (ELECTROSURGICAL) ×2
ELECTRODE REM PT RTRN 9FT ADLT (ELECTROSURGICAL) ×1 IMPLANT
GLOVE BIOGEL PI IND STRL 7.5 (GLOVE) ×1 IMPLANT
GLOVE BIOGEL PI IND STRL 8 (GLOVE) ×1 IMPLANT
GLOVE BIOGEL PI INDICATOR 7.5 (GLOVE) ×1
GLOVE BIOGEL PI INDICATOR 8 (GLOVE) ×1
GLOVE ECLIPSE 7.0 STRL STRAW (GLOVE) ×2 IMPLANT
GLOVE ORTHO TXT STRL SZ7.5 (GLOVE) ×2 IMPLANT
GOWN PREVENTION PLUS LG XLONG (DISPOSABLE) IMPLANT
GOWN STRL NON-REIN LRG LVL3 (GOWN DISPOSABLE) ×6 IMPLANT
KIT BASIN OR (CUSTOM PROCEDURE TRAY) ×2 IMPLANT
KIT ROOM TURNOVER OR (KITS) ×2 IMPLANT
MANIFOLD NEPTUNE II (INSTRUMENTS) ×2 IMPLANT
NDL HYPO 25GX1X1/2 BEV (NEEDLE) ×1 IMPLANT
NDL SPNL 18GX3.5 QUINCKE PK (NEEDLE) ×1 IMPLANT
NDL SUT .5 MAYO 1.404X.05X (NEEDLE) ×1 IMPLANT
NEEDLE HYPO 25GX1X1/2 BEV (NEEDLE) ×2 IMPLANT
NEEDLE MAYO TAPER (NEEDLE) ×2
NEEDLE SPNL 18GX3.5 QUINCKE PK (NEEDLE) ×2 IMPLANT
NS IRRIG 1000ML POUR BTL (IV SOLUTION) ×2 IMPLANT
PACK LAMINECTOMY ORTHO (CUSTOM PROCEDURE TRAY) ×2 IMPLANT
PAD ARMBOARD 7.5X6 YLW CONV (MISCELLANEOUS) ×4 IMPLANT
PATTIES SURGICAL .5 X.5 (GAUZE/BANDAGES/DRESSINGS) ×2 IMPLANT
PATTIES SURGICAL .75X.75 (GAUZE/BANDAGES/DRESSINGS) IMPLANT
SPONGE GAUZE 4X4 12PLY (GAUZE/BANDAGES/DRESSINGS) ×2 IMPLANT
STRIP CLOSURE SKIN 1/2X4 (GAUZE/BANDAGES/DRESSINGS) IMPLANT
SUT VIC AB 2-0 CT1 27 (SUTURE) ×2
SUT VIC AB 2-0 CT1 TAPERPNT 27 (SUTURE) ×1 IMPLANT
SUT VICRYL 0 TIES 12 18 (SUTURE) ×2 IMPLANT
SUT VICRYL 4-0 PS2 18IN ABS (SUTURE) IMPLANT
SUT VICRYL AB 2 0 TIES (SUTURE) ×2 IMPLANT
SYR 20ML ECCENTRIC (SYRINGE) IMPLANT
SYR CONTROL 10ML LL (SYRINGE) ×2 IMPLANT
TAPE CLOTH SURG 6X10 WHT LF (GAUZE/BANDAGES/DRESSINGS) ×1 IMPLANT
TOWEL OR 17X24 6PK STRL BLUE (TOWEL DISPOSABLE) ×2 IMPLANT
TOWEL OR 17X26 10 PK STRL BLUE (TOWEL DISPOSABLE) ×2 IMPLANT
WATER STERILE IRR 1000ML POUR (IV SOLUTION) ×2 IMPLANT

## 2012-06-06 NOTE — Preoperative (Signed)
Beta Blockers   Reason not to administer Beta Blockers:Not Applicable 

## 2012-06-06 NOTE — Op Note (Signed)
NAME:  Terry Garner, Terry Garner NO.:  [id number]  MEDICAL RECORD NO.:  [id number]  LOCATION:  5N23C                        FACILITY:  [facility]  PHYSICIAN:  Rashika Bettes C. Lorin Mercy, M.D.    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  PROCEDURE:  Left L4-5 microdiskectomy.  SURGEON:  Vidalia Serpas C. Lorin Mercy, MD  ASSISTANT:  RNFA.  ANESTHESIA:  General plus Marcaine skin local.  EBL:  Minimal.  COMPLICATIONS:  None.  This 71 year old male with some history of PAD and renal insufficiency. He has had progressive increased size L4-5 HNP, left, failure to respond to anti-inflammatories, pain medications, epidural steroids with progressive weakness, giving way, and large HNP central on the left with retained fragment inferiorly migrated behind the L5 body.  PROCEDURE:  After induction of general anesthesia, the patient placed prone on chest rolls.  Arterial line was placed by anesthesia.  Back was prepped with DuraPrep.  The area was squared with towels, Betadine. Steri-Drape applied after laminectomy sheet.  Incision was made after needle localization.  Cross-table lateral showed that opening was at the L4-5 level.  Laminotomy was done on the left coming up __________ ligament.  Penfield 4 was placed and 2nd x-ray was taken confirming that we were exactly at the L4-5 disk.  Spinous process was marked. Operative microscope was draped, brought in, and disk was visualized with compression on the nerve root.  Some epidural veins were coagulated in the lateral gutter.  Anulus was incised and passes were made removing some disk material.  Once this was partially decompressed, the D'Errico was able to be slid caudad and the ligament was divided  overlying L5 vertebral body and then using micro pituitary the large fragment was delivered all in 1 large chunk.  The sac was then decompressed. Hockey stick was able to be passed anterior to the dura 180 degrees.  No areas of remaining compression.  Passes were made with up and down micro pituitaries, regular straight pituitaries, up biting large pituitaries into the disk with removal of minimal to moderate amount degenerative material.  Foramina was enlarged.  Nerve root was free.  No fragments were out the foramina.  After irrigation with saline solution, standard layered closure with #1 Vicryl in the deep fascia, 2- 0 Vicryl subcutaneous tissue, and 4-0 Vicryl subcuticular closure. Dermabond, 4x4s, tape dressing were applied.  The patient tolerated the procedure well and was transferred to the recovery room in stable condition.     Shawan Tosh C. Lorin Mercy, M.D.     MCY/MEDQ  D:  06/06/2012  T:  06/06/2012  Job:  [id number]

## 2012-06-06 NOTE — Interval H&P Note (Signed)
History and Physical Interval Note:  06/06/2012 7:28 AM  Terry Garner  has presented today for surgery, with the diagnosis of Left L4-5 HNP  The various methods of treatment have been discussed with the patient and family. After consideration of risks, benefits and other options for treatment, the patient has consented to  Procedure(s) (LRB) with comments: MICRODISCECTOMY LUMBAR LAMINECTOMY (Left) - Left L4-5 Microdiscectomy as a surgical intervention .  The patient's history has been reviewed, patient examined, no change in status, stable for surgery.  I have reviewed the patient's chart and labs.  Questions were answered to the patient's satisfaction.     Jaspal Pultz C

## 2012-06-06 NOTE — Brief Op Note (Signed)
06/06/2012  9:19 AM  PATIENT:  [name]  71 y.o. male  PRE-OPERATIVE DIAGNOSIS:  Left L4-5 HNP  POST-OPERATIVE DIAGNOSIS:  Left L4-5 HNP  PROCEDURE:  Procedure(s) (LRB) with comments: MICRODISCECTOMY LUMBAR LAMINECTOMY (Left) - Left L4-5 Microdiscectomy  SURGEON:  Surgeon(s) and Role:    * Marybelle Killings, MD - Primary  PHYSICIAN ASSISTANT:   ASSISTANTS: RNFA   ANESTHESIA:   local and general  EBL:  Total I/O In: 1000 [I.V.:1000] Out: 50 [Blood:50]  BLOOD ADMINISTERED:none  DRAINS: none   LOCAL MEDICATIONS USED:  MARCAINE     SPECIMEN:  No Specimen  DISPOSITION OF SPECIMEN:  N/A  COUNTS:  YES  TOURNIQUET:  * No tourniquets in log *  DICTATION: .Other Dictation: Dictation Number [id number]  PLAN OF CARE: Admit for overnight observation  PATIENT DISPOSITION:  PACU - hemodynamically stable.   Delay start of Pharmacological VTE agent (>24hrs) due to surgical blood loss or risk of bleeding: spine case no heparin no lovenox no coumadin, no xaralto

## 2012-06-06 NOTE — Anesthesia Postprocedure Evaluation (Signed)
  Anesthesia Post-op Note  Patient: [name]  Procedure(s) Performed: Procedure(s) (LRB) with comments: MICRODISCECTOMY LUMBAR LAMINECTOMY (Left) - Left L4-5 Microdiscectomy  Patient Location: PACU  Anesthesia Type:General  Level of Consciousness: awake, alert , oriented and patient cooperative  Airway and Oxygen Therapy: Patient Spontanous Breathing  Post-op Pain: none  Post-op Assessment: Post-op Vital signs reviewed, Patient's Cardiovascular Status Stable, Respiratory Function Stable, Patent Airway, No signs of Nausea or vomiting and Pain level controlled  Post-op Vital Signs: Reviewed and stable  Complications: No apparent anesthesia complications

## 2012-06-06 NOTE — Transfer of Care (Signed)
Immediate Anesthesia Transfer of Care Note  Patient: Terry Garner  Procedure(s) Performed: Procedure(s) (LRB) with comments: MICRODISCECTOMY LUMBAR LAMINECTOMY (Left) - Left L4-5 Microdiscectomy  Patient Location: PACU  Anesthesia Type:General  Level of Consciousness: awake, alert  and oriented  Airway & Oxygen Therapy: Patient Spontanous Breathing and Patient connected to face mask oxygen  Post-op Assessment: Report given to PACU RN  Post vital signs: Reviewed and stable  Complications: No apparent anesthesia complications

## 2012-06-06 NOTE — Anesthesia Preprocedure Evaluation (Addendum)
Anesthesia Evaluation  Patient identified by MRN, date of birth, ID band Patient awake    Reviewed: Allergy & Precautions, H&P , NPO status , Patient's Chart, lab work & pertinent test results  History of Anesthesia Complications Negative for: history of anesthetic complications  Airway Mallampati: II TM Distance: >3 FB Neck ROM: Full    Dental  (+) Teeth Intact, Dental Advisory Given and Caps   Pulmonary neg pulmonary ROS,  breath sounds clear to auscultation  Pulmonary exam normal       Cardiovascular hypertension, Pt. on medications + CAD, + Cardiac Stents (off plavix for a week) and + Peripheral Vascular Disease (renal artery stenosis, AAA 3.5x 3.6cm) + Cardiac Defibrillator Rhythm:Regular Rate:Normal  Cath '10: stents patent with normal LVF   Neuro/Psych Chronic back pain    GI/Hepatic negative GI ROS, Neg liver ROS,   Endo/Other  negative endocrine ROS  Renal/GU Renal InsufficiencyRenal disease (creat 2.42)     Musculoskeletal   Abdominal Normal abdominal exam  (+) + obese,   Peds  Hematology   Anesthesia Other Findings   Reproductive/Obstetrics                         Anesthesia Physical Anesthesia Plan  ASA: III  Anesthesia Plan: General   Post-op Pain Management:    Induction: Intravenous  Airway Management Planned: Oral ETT  Additional Equipment: Arterial line  Intra-op Plan:   Post-operative Plan: Extubation in OR  Informed Consent: I have reviewed the patients History and Physical, chart, labs and discussed the procedure including the risks, benefits and alternatives for the proposed anesthesia with the patient or authorized representative who has indicated his/her understanding and acceptance.   Dental advisory given  Plan Discussed with: CRNA, Anesthesiologist and Surgeon  Anesthesia Plan Comments: (Plan routine monitors, A line, GETA)       Anesthesia Quick  Evaluation

## 2012-06-07 MED ORDER — METHOCARBAMOL 500 MG PO TABS: 500.0000 mg | ORAL_TABLET | Freq: Four times a day (QID) | ORAL | Status: DC | PRN

## 2012-06-07 MED ORDER — OXYCODONE-ACETAMINOPHEN 5-325 MG PO TABS: 1.0000 | ORAL_TABLET | ORAL | Status: DC | PRN

## 2012-06-07 NOTE — Discharge Summary (Signed)
Physician Discharge Summary  Patient ID: Terry Garner MRN: [id number] DOB/AGE: 73-Aug-1942 73 y.o.  Admit date: 06/06/2012 Discharge date: 06/07/2012  Admission Diagnoses:left L4-5 HNP  Discharge Diagnoses: same Active Problems:  Herniated lumbar intervertebral disc   Discharged Condition: good  Hospital Course: underwent surgery, good relief of leg pain, no weakness post op , resolution of pre-op weakness and pain. Ambulatory, on no pain meds.   Consults: None  Significant Diagnostic Studies:  Treatments: surgery: above  Discharge Exam: Blood pressure 134/37, pulse 72, temperature 97.9 F (36.6 C), temperature source Oral, resp. rate 18, height 6\' 3"  (1.905 m), weight 102.059 kg (225 lb), SpO2 97.00%. Incision/Wound:clean and dry  Disposition:   Discharge Orders    Future Appointments: Provider: Department: Dept Phone: Center:   10/28/2012 8:00 AM [name], MD [place] [phone] None       Medication List     As of 06/07/2012 10:48 AM    TAKE these medications         allopurinol 300 MG tablet   Commonly known as: ZYLOPRIM   Take 150 mg by mouth daily.      aspirin EC 81 MG tablet   Take 81 mg by mouth daily.      clopidogrel 75 MG tablet   Commonly known as: PLAVIX   Take 75 mg by mouth daily.      fenofibrate micronized 134 MG capsule   Commonly known as: LOFIBRA   Take 134 mg by mouth daily before breakfast.      Fish Oil 1000 MG Caps   Take 1 capsule by mouth daily.      Flaxseed Oil 1200 MG Caps   Take 1 capsule by mouth daily.      isosorbide mononitrate 30 MG 24 hr tablet   Commonly known as: IMDUR   Take 30 mg by mouth 2 (two) times daily.      methocarbamol 500 MG tablet   Commonly known as: ROBAXIN   Take 1 tablet (500 mg total) by mouth 3 (three) times daily.      methocarbamol 500 MG tablet   Commonly known as: ROBAXIN   Take 1 tablet (500 mg total) by mouth every 6 (six) hours as needed  (spasms).      oxyCODONE-acetaminophen 5-325 MG per tablet   Commonly known as: PERCOCET/ROXICET   Take 1-2 tablets by mouth every 4 (four) hours as needed for pain.      pravastatin 40 MG tablet   Commonly known as: PRAVACHOL   Take 20 mg by mouth daily.      travoprost (benzalkonium) 0.004 % ophthalmic solution   Commonly known as: TRAVATAN   2 drops at bedtime.      Vitamin D3 2000 UNITS Tabs   Take 1 tablet by mouth 2 (two) times daily.           Follow-up Information    Follow up with Drenda Sobecki C, MD. In 1 week.   Contact information:   [place] [address] [phone]          Signed: Marybelle Killings 06/07/2012, 10:48 AM

## 2012-06-07 NOTE — Progress Notes (Signed)
Prescriptions and discharge instructions reviewed with patient and wife.  Lower back incision dressing changed with wife present.  Back and incision precautions reviewed with teach-back technique.  Pt. Able to ambulate without difficulty with contact guard assist.  Will be utilizing cane at home.  Pt. Discharged home via private vehicle with wife.  Escorted to exit via wheelchair assisted by volunteer.

## 2012-06-09 ENCOUNTER — Encounter (HOSPITAL_COMMUNITY): Payer: Self-pay | Admitting: Orthopaedic Surgery

## 2012-10-28 ENCOUNTER — Ambulatory Visit (INDEPENDENT_AMBULATORY_CARE_PROVIDER_SITE_OTHER): Payer: Medicare Other | Admitting: Ophthalmology

## 2012-10-28 DIAGNOSIS — H35039 Hypertensive retinopathy, unspecified eye: Secondary | ICD-10-CM

## 2012-10-28 DIAGNOSIS — H2510 Age-related nuclear cataract, unspecified eye: Secondary | ICD-10-CM

## 2012-10-28 DIAGNOSIS — H348192 Central retinal vein occlusion, unspecified eye, stable: Secondary | ICD-10-CM

## 2012-10-28 DIAGNOSIS — I10 Essential (primary) hypertension: Secondary | ICD-10-CM

## 2012-10-28 DIAGNOSIS — H43819 Vitreous degeneration, unspecified eye: Secondary | ICD-10-CM

## 2012-10-28 DIAGNOSIS — H3530 Unspecified macular degeneration: Secondary | ICD-10-CM

## 2013-01-15 ENCOUNTER — Ambulatory Visit (INDEPENDENT_AMBULATORY_CARE_PROVIDER_SITE_OTHER): Payer: Self-pay | Admitting: Cardiovascular Disease

## 2013-01-15 ENCOUNTER — Encounter: Payer: Self-pay | Admitting: Cardiovascular Disease

## 2013-01-15 VITALS — BP 140/78 | HR 48 | Ht 75.0 in | Wt 226.0 lb

## 2013-01-15 DIAGNOSIS — I2510 Atherosclerotic heart disease of native coronary artery without angina pectoris: Secondary | ICD-10-CM

## 2013-01-15 DIAGNOSIS — I779 Disorder of arteries and arterioles, unspecified: Secondary | ICD-10-CM | POA: Insufficient documentation

## 2013-01-15 DIAGNOSIS — I10 Essential (primary) hypertension: Secondary | ICD-10-CM

## 2013-01-15 DIAGNOSIS — I4510 Unspecified right bundle-branch block: Secondary | ICD-10-CM | POA: Insufficient documentation

## 2013-01-15 DIAGNOSIS — I25810 Atherosclerosis of coronary artery bypass graft(s) without angina pectoris: Secondary | ICD-10-CM | POA: Insufficient documentation

## 2013-01-15 DIAGNOSIS — E785 Hyperlipidemia, unspecified: Secondary | ICD-10-CM | POA: Insufficient documentation

## 2013-01-15 NOTE — Assessment & Plan Note (Signed)
Well-controlled on current medications 

## 2013-01-15 NOTE — Progress Notes (Signed)
01/15/2013 Terry Garner   [birth year]  [id number]  Primary Physician MCKEOWN,WILLIAM DAVID, MD Primary Cardiologist: [name] MD [name]   HPI:  Terry Garner is a 72 year old moderately overweight married Caucasian male father of one child formally a patient of Terry Garner. [facility]. He has a history of CAD status post LAD and diagonal branch stenting back in 2006, 2007 and 2008. He underwent cardiac catheterization by Dr. Nona Dell 06/09/09 revealing widely patent stents after a false positive Myoview. His other problems include chronic branch block, treated hypertension and hyperlipidemia. He denies chest pain or shortness of breath. He does have moderate carotid disease with, duplex ultrasound. He is neurologically asymptomatic on aspirin and Plavix.   Current Outpatient Prescriptions  Medication Sig Dispense Refill  . allopurinol (ZYLOPRIM) 300 MG tablet Take 150 mg by mouth daily.      [name] aspirin EC 81 MG tablet Take 81 mg by mouth daily.      . Cholecalciferol (VITAMIN D3) 2000 UNITS TABS Take 1 tablet by mouth 2 (two) times daily.      . clopidogrel (PLAVIX) 75 MG tablet Take 75 mg by mouth daily.      . fenofibrate micronized (LOFIBRA) 134 MG capsule Take 134 mg by mouth daily before breakfast.      . Flaxseed, Linseed, (FLAXSEED OIL) 1200 MG CAPS Take 1 capsule by mouth daily.      . isosorbide mononitrate (IMDUR) 30 MG 24 hr tablet Take 30 mg by mouth 2 (two) times daily.      [name] latanoprost (XALATAN) 0.005 % ophthalmic solution       . Omega-3 Fatty Acids (FISH OIL) 1000 MG CAPS Take 1 capsule by mouth daily.      . pravastatin (PRAVACHOL) 40 MG tablet Take 20 mg by mouth daily.      . travoprost, benzalkonium, (TRAVATAN) 0.004 % ophthalmic solution 2 drops at bedtime.       No current facility-administered medications for this visit.    Allergies  Allergen Reactions  . Coumadin (Warfarin Sodium)   . Pravastatin     Memory issues, myalgias  . Toprol Xl  (Metoprolol Tartrate)     Bradycardia with beta blockers  . Tape Rash    Clear tape    History   Social History  . Marital Status: Married    Spouse Name: N/A    Number of Children: N/A  . Years of Education: N/A   Occupational History  . Not on file.   Social History Main Topics  . Smoking status: Never Smoker   . Smokeless tobacco: Never Used  . Alcohol Use: No  . Drug Use: No  . Sexually Active:    Other Topics Concern  . Not on file   Social History Narrative  . No narrative on file     Review of Systems: General: negative for chills, fever, night sweats or weight changes.  Cardiovascular: negative for chest pain, dyspnea on exertion, edema, orthopnea, palpitations, paroxysmal nocturnal dyspnea or shortness of breath Dermatological: negative for rash Respiratory: negative for cough or wheezing Urologic: negative for hematuria Abdominal: negative for nausea, vomiting, diarrhea, bright red blood per rectum, melena, or hematemesis Neurologic: negative for visual changes, syncope, or dizziness All other systems reviewed and are otherwise negative except as noted above.    Blood pressure 140/78, pulse 48, height 6\' 3"  (1.905 m), weight 226 lb (102.513 kg).  General appearance: alert and no distress Neck: no adenopathy, no carotid bruit,  no JVD, supple, symmetrical, trachea midline and thyroid not enlarged, symmetric, no tenderness/mass/nodules Lungs: clear to auscultation bilaterally Heart: regular rate and rhythm, S1, S2 normal, no murmur, click, rub or gallop Extremities: extremities normal, atraumatic, no cyanosis or edema  EKG sinus bradycardia of 48 with right bundle branch block unchanged from prior EKGs  ASSESSMENT AND PLAN:   Coronary artery disease The patient has had stenting of his coronary arteries in 2006 2007 as well 2008 of his LAD and diagonal branches. He had a false positive Myoview in 2010 underwent cardiac catheterization by Dr. Nona Dell  06/09/09 revealing widely patent stents in a left dominant system with normal LV function. He is asymptomatic.  Essential hypertension Well-controlled on current medications  Hyperlipidemia On statin therapy followed by his PCP  Carotid artery disease Followed by duplex ultrasound with mild disease on the right, moderate on the left, and neurologically asymptomatic on aspirin therapy as well as Plavix.      [name] MD FACP,FACC,FAHA, [facility] 01/15/2013 10:22 AM

## 2013-01-15 NOTE — Assessment & Plan Note (Signed)
Followed by duplex ultrasound with mild disease on the right, moderate on the left, and neurologically asymptomatic on aspirin therapy as well as Plavix.

## 2013-01-15 NOTE — Assessment & Plan Note (Signed)
On statin therapy followed by his PCP 

## 2013-01-15 NOTE — Assessment & Plan Note (Signed)
The patient has had stenting of his coronary arteries in 2006 2007 as well 2008 of his LAD and diagonal branches. He had a false positive Myoview in 2010 underwent cardiac catheterization by Dr. Nona Dell 06/09/09 revealing widely patent stents in a left dominant system with normal LV function. He is asymptomatic.

## 2013-01-15 NOTE — Patient Instructions (Signed)
  We will see you back in follow up in 12 months with Dr [name]  Dr [name] has ordered carotid dopplers

## 2013-01-26 ENCOUNTER — Ambulatory Visit (HOSPITAL_COMMUNITY)
Admission: RE | Admit: 2013-01-26 | Discharge: 2013-01-26 | Disposition: A | Discharge status: Home / Self Care | Payer: Medicare Other | Source: Ambulatory Visit | Attending: Cardiology | Admitting: Cardiology

## 2013-01-26 ENCOUNTER — Telehealth: Payer: Self-pay | Admitting: *Deleted

## 2013-01-26 DIAGNOSIS — I779 Disorder of arteries and arterioles, unspecified: Secondary | ICD-10-CM

## 2013-01-26 DIAGNOSIS — I6529 Occlusion and stenosis of unspecified carotid artery: Secondary | ICD-10-CM

## 2013-01-26 NOTE — Telephone Encounter (Signed)
I spoke with Terry Garner about the ASA and Plavix.  I advised him that he can hold them as needed.

## 2013-01-26 NOTE — Progress Notes (Signed)
Carotid Duplex Completed. Ranay Ketter, BS, RDMS, RVT  

## 2013-01-26 NOTE — Telephone Encounter (Signed)
Terry Garner walked in and filled out a yellow sheet asking for clearance to come off of his coumadin prior to dental work.  He is not on coumadin, but is taking plavix.  Dr [name] reviewed the chart and he can hold his plavix prior to dental work if needed.  I called patient and left a message for his to call me back so that I could clarify.

## 2013-01-30 ENCOUNTER — Telehealth: Payer: Self-pay | Admitting: *Deleted

## 2013-01-30 ENCOUNTER — Encounter: Payer: Self-pay | Admitting: *Deleted

## 2013-01-30 DIAGNOSIS — I6529 Occlusion and stenosis of unspecified carotid artery: Secondary | ICD-10-CM

## 2013-01-30 NOTE — Telephone Encounter (Signed)
Message copied by Chauncy Lean on Fri Jan 30, 2013  1:26 PM ------      Message from: [name]      Created: Thu Jan 29, 2013  7:12 PM       No change from prior study. Repeat in 12 months. ------

## 2013-04-02 ENCOUNTER — Other Ambulatory Visit: Payer: Self-pay | Admitting: Neurosurgery

## 2013-04-02 DIAGNOSIS — M48061 Spinal stenosis, lumbar region without neurogenic claudication: Secondary | ICD-10-CM

## 2013-04-03 ENCOUNTER — Encounter: Payer: Self-pay | Admitting: *Deleted

## 2013-04-06 ENCOUNTER — Ambulatory Visit
Admission: RE | Admit: 2013-04-06 | Discharge: 2013-04-06 | Disposition: A | Discharge status: Home / Self Care | Payer: Medicare Other | Source: Ambulatory Visit | Attending: Neurosurgery | Admitting: Neurosurgery

## 2013-04-06 DIAGNOSIS — M48061 Spinal stenosis, lumbar region without neurogenic claudication: Secondary | ICD-10-CM

## 2013-04-09 ENCOUNTER — Other Ambulatory Visit: Payer: Self-pay | Admitting: Neurosurgery

## 2013-04-13 ENCOUNTER — Telehealth (HOSPITAL_COMMUNITY): Payer: Self-pay | Admitting: *Deleted

## 2013-04-22 NOTE — Pre-Procedure Instructions (Addendum)
ASON CASTROGIOVANNI  04/22/2013   Your procedure is scheduled on:  04/29/13  Report to [facility]  2 * 3 at 600 AM.  Call this number if you have problems the morning of surgery: [phone]   Remember:   Do not eat food or drink liquids after midnight.   Take these medicines the morning of surgery with A SIP OF WATER eye drop, imdur, allopurinol          STOP aspirin,plavix per dr ,    Leeanne Deed oil, omega 3 on 04/24/13   Do not wear jewelry, make-up or nail polish.  Do not wear lotions, powders, or perfumes. You may wear deodorant.  Do not shave 48 hours prior to surgery. Men may shave face and neck.  Do not bring valuables to the hospital.  [facility] is not responsible                  for any belongings or valuables.               Contacts, dentures or bridgework may not be worn into surgery.  Leave suitcase in the car. After surgery it may be brought to your room.  For patients admitted to the hospital, discharge time is determined by your                treatment team.               Patients discharged the day of surgery will not be allowed to drive  home.  Name and phone number of your driver:   Special Instructions: Shower using CHG 2 nights before surgery and the night before surgery.  If you shower the day of surgery use CHG.  Use special wash - you have one bottle of CHG for all showers.  You should use approximately 1/3 of the bottle for each shower.   Please read over the following fact sheets that you were given: Pain Booklet, Coughing and Deep Breathing, Blood Transfusion Information, MRSA Information and Surgical Site Infection Prevention

## 2013-04-23 ENCOUNTER — Encounter (HOSPITAL_COMMUNITY): Payer: Self-pay

## 2013-04-23 ENCOUNTER — Encounter (HOSPITAL_COMMUNITY)
Admission: RE | Admit: 2013-04-23 | Discharge: 2013-04-23 | Disposition: A | Discharge status: Home / Self Care | Payer: Medicare Other | Source: Ambulatory Visit | Attending: Neurosurgery | Admitting: Neurosurgery

## 2013-04-23 ENCOUNTER — Encounter (HOSPITAL_COMMUNITY)
Admission: RE | Admit: 2013-04-23 | Discharge: 2013-04-23 | Disposition: A | Discharge status: Home / Self Care | Payer: Medicare Other | Source: Ambulatory Visit | Attending: Anesthesiology | Admitting: Anesthesiology

## 2013-04-23 DIAGNOSIS — Z01818 Encounter for other preprocedural examination: Secondary | ICD-10-CM | POA: Insufficient documentation

## 2013-04-23 DIAGNOSIS — Z01812 Encounter for preprocedural laboratory examination: Secondary | ICD-10-CM | POA: Insufficient documentation

## 2013-04-23 LAB — SURGICAL PCR SCREEN
MRSA, PCR: NEGATIVE
Staphylococcus aureus: NEGATIVE

## 2013-04-23 LAB — TYPE AND SCREEN
ABO/RH(D): A POS
Antibody Screen: NEGATIVE

## 2013-04-23 LAB — CBC
MCH: 30.1 pg (ref 26.0–34.0)
MCV: 87.3 fL (ref 78.0–100.0)
Platelets: 196 10*3/uL (ref 150–400)
WBC: 5.5 10*3/uL (ref 4.0–10.5)

## 2013-04-23 LAB — BASIC METABOLIC PANEL
BUN: 23 mg/dL (ref 6–23)
CO2: 25 mEq/L (ref 19–32)
Calcium: 9.6 mg/dL (ref 8.4–10.5)
Creatinine, Ser: 2.11 mg/dL — ABNORMAL HIGH (ref 0.50–1.35)
GFR calc non Af Amer: 29 mL/min — ABNORMAL LOW (ref >90)
Glucose, Bld: 100 mg/dL — ABNORMAL HIGH (ref 70–99)

## 2013-04-23 LAB — ABO/RH: ABO/RH(D): A POS

## 2013-04-23 NOTE — Progress Notes (Signed)
Anesthesia chart review: Patient is a 72 year old male scheduled for L3-4, L4-5 decompression, PLIF on 04/29/13 by Dr. Sherwood Gambler.    History includes nonsmoker, CAD s/p LAD and DIAG stents in '06, '07, and '08, hypertension, arthritis, CKD with renal artery stenosis (dopplers on 01/25/12 suggested 1-59% bilaterally), small AAA (3.5 X 3.6 cm by notes, but only 3.22 X 3.17 by doppler on 01/25/12), prior joint and cervical disc surgeries, L4-5 microdiscectomy 06/06/12. Dr. [name].   Cardiologist was Dr. Rex Kras [facility]), who recently retired. He is now being followed by Dr. Quay Burow sho cleared patient for surgery with permission to hold ASA/Plavix.    EKG on 01/15/13 showed sinus bradycardia @ 49 bpm, right bundle branch block.   Cardiac cath on 06/09/2009 showed normal left ventricular function, mild coronary obstructive disease without significant restenosis at the prior intervention sites with evidence of 20% mild narrowing within the proximal LAD artery stent, no restenosis in the mid-diagonal stent, and luminal irregularities of the LAD. Widely patent stent in the ramus intermediate vessel, dominant left circumflex coronary with 30% smooth ostial narrowing with 40% proximal narrowing prior to the obtuse marginal 1 takeoff. Nondominant RCA with smooth 20% proximal narrowing. Medical therapy was recommended.   Echo on 06/07/09 showed moderate concentric LVH, normal LV systolic function, EF [id number], normal wall motion, findings consistent with grade 1 diastolic dysfunction. Mild aortic stenosis, trivial aortic regurgitation, Valve area: 1.92 cm\S\2 (VTI). Valve area: 1.72 cm\S\2 (Vmax). Mild mitral valve regurgitation. The left atrial appendage was mildly to moderately dilated.   His last nuclear stress test prior to his 05/2009 cardiac cath.   Carotid duplex on 01/26/13 showed 0-49% bilateral ICA stenosis.  One year follow-up is planned.  Renal doppler study at [facility] on 01/25/12 showed  bilateral 1-59% renal artery stenosis and one year follow-up was recommended.   Chest x-ray on 04/23/2013 showed no acute findings, thoracic spondylosis.    Pre-operative labs noted. BUN/Cr 23/2.11. Glucose 100. H/H 12.3/35.6. His renal function appears stable when compared to prior labs from 05/2012.    If no acute changes then I anticipate that he can proceed as planned.  [facility] Short Stay Center/Anesthesiology Phone [phone] 04/23/2013 2:18 PM

## 2013-04-28 MED ORDER — CEFAZOLIN SODIUM-DEXTROSE 2-3 GM-% IV SOLR
2.0000 g | INTRAVENOUS | Status: AC
  Administered 2013-04-29 (×2): 2 g via INTRAVENOUS
  Filled 2013-04-28: qty 50

## 2013-04-29 ENCOUNTER — Inpatient Hospital Stay (HOSPITAL_COMMUNITY)
Admission: RE | Admit: 2013-04-29 | Discharge: 2013-04-30 | DRG: 460 | Disposition: A | Discharge status: Home / Self Care | Payer: Medicare Other | Source: Ambulatory Visit | Attending: Neurosurgery | Admitting: Neurosurgery

## 2013-04-29 ENCOUNTER — Encounter (HOSPITAL_COMMUNITY)
Admission: RE | Disposition: A | Discharge status: Home / Self Care | Payer: Medicare Other | Source: Ambulatory Visit | Attending: Neurosurgery

## 2013-04-29 ENCOUNTER — Inpatient Hospital Stay (HOSPITAL_COMMUNITY): Payer: Medicare Other

## 2013-04-29 ENCOUNTER — Encounter (HOSPITAL_COMMUNITY): Payer: Self-pay | Admitting: *Deleted

## 2013-04-29 ENCOUNTER — Encounter (HOSPITAL_COMMUNITY): Payer: Medicare Other | Admitting: Vascular Surgery

## 2013-04-29 ENCOUNTER — Inpatient Hospital Stay (HOSPITAL_COMMUNITY): Payer: Medicare Other | Admitting: Anesthesiology

## 2013-04-29 DIAGNOSIS — N189 Chronic kidney disease, unspecified: Secondary | ICD-10-CM | POA: Diagnosis present

## 2013-04-29 DIAGNOSIS — Z96649 Presence of unspecified artificial hip joint: Secondary | ICD-10-CM

## 2013-04-29 DIAGNOSIS — M5126 Other intervertebral disc displacement, lumbar region: Principal | ICD-10-CM | POA: Diagnosis present

## 2013-04-29 DIAGNOSIS — Z7982 Long term (current) use of aspirin: Secondary | ICD-10-CM

## 2013-04-29 DIAGNOSIS — I129 Hypertensive chronic kidney disease with stage 1 through stage 4 chronic kidney disease, or unspecified chronic kidney disease: Secondary | ICD-10-CM | POA: Diagnosis present

## 2013-04-29 DIAGNOSIS — I714 Abdominal aortic aneurysm, without rupture, unspecified: Secondary | ICD-10-CM | POA: Diagnosis present

## 2013-04-29 DIAGNOSIS — I2510 Atherosclerotic heart disease of native coronary artery without angina pectoris: Secondary | ICD-10-CM | POA: Diagnosis present

## 2013-04-29 DIAGNOSIS — E785 Hyperlipidemia, unspecified: Secondary | ICD-10-CM | POA: Diagnosis present

## 2013-04-29 DIAGNOSIS — M47817 Spondylosis without myelopathy or radiculopathy, lumbosacral region: Secondary | ICD-10-CM | POA: Diagnosis present

## 2013-04-29 DIAGNOSIS — M48062 Spinal stenosis, lumbar region with neurogenic claudication: Secondary | ICD-10-CM | POA: Diagnosis present

## 2013-04-29 DIAGNOSIS — Z7902 Long term (current) use of antithrombotics/antiplatelets: Secondary | ICD-10-CM

## 2013-04-29 DIAGNOSIS — Z9861 Coronary angioplasty status: Secondary | ICD-10-CM

## 2013-04-29 DIAGNOSIS — M51379 Other intervertebral disc degeneration, lumbosacral region without mention of lumbar back pain or lower extremity pain: Secondary | ICD-10-CM | POA: Diagnosis present

## 2013-04-29 DIAGNOSIS — Z79899 Other long term (current) drug therapy: Secondary | ICD-10-CM

## 2013-04-29 DIAGNOSIS — M5137 Other intervertebral disc degeneration, lumbosacral region: Secondary | ICD-10-CM | POA: Diagnosis present

## 2013-04-29 SURGERY — POSTERIOR LUMBAR FUSION 2 LEVEL
Anesthesia: General | Site: Back | Wound class: Clean

## 2013-04-29 MED ORDER — ROCURONIUM BROMIDE 100 MG/10ML IV SOLN
INTRAVENOUS | Status: DC | PRN
  Administered 2013-04-29: 50 mg via INTRAVENOUS

## 2013-04-29 MED ORDER — OXYCODONE HCL 5 MG PO TABS: 5.0000 mg | ORAL_TABLET | Freq: Once | ORAL | Status: DC | PRN

## 2013-04-29 MED ORDER — ALBUMIN HUMAN 5 % IV SOLN
INTRAVENOUS | Status: DC | PRN
  Administered 2013-04-29 (×2): via INTRAVENOUS

## 2013-04-29 MED ORDER — MAGNESIUM HYDROXIDE 400 MG/5ML PO SUSP: 30.0000 mL | Freq: Every day | ORAL | Status: DC | PRN

## 2013-04-29 MED ORDER — BUPIVACAINE HCL (PF) 0.5 % IJ SOLN
INTRAMUSCULAR | Status: DC | PRN
  Administered 2013-04-29: 12.5 mL

## 2013-04-29 MED ORDER — MORPHINE SULFATE 4 MG/ML IJ SOLN: 4.0000 mg | INTRAMUSCULAR | Status: DC | PRN

## 2013-04-29 MED ORDER — HYDROXYZINE HCL 25 MG PO TABS: 50.0000 mg | ORAL_TABLET | ORAL | Status: DC | PRN

## 2013-04-29 MED ORDER — THROMBIN 20000 UNITS EX SOLR
CUTANEOUS | Status: DC | PRN
  Administered 2013-04-29: 11:00:00 via TOPICAL

## 2013-04-29 MED ORDER — BISACODYL 10 MG RE SUPP: 10.0000 mg | Freq: Every day | RECTAL | Status: DC | PRN

## 2013-04-29 MED ORDER — ALLOPURINOL 150 MG HALF TABLET
150.0000 mg | ORAL_TABLET | Freq: Every day | ORAL | Status: DC
  Administered 2013-04-30: 150 mg via ORAL
  Filled 2013-04-29: qty 1

## 2013-04-29 MED ORDER — ACETAMINOPHEN 325 MG PO TABS: 650.0000 mg | ORAL_TABLET | ORAL | Status: DC | PRN

## 2013-04-29 MED ORDER — GLYCOPYRROLATE 0.2 MG/ML IJ SOLN
INTRAMUSCULAR | Status: DC | PRN
  Administered 2013-04-29: .6 mg via INTRAVENOUS
  Administered 2013-04-29: 0.2 mg via INTRAVENOUS

## 2013-04-29 MED ORDER — LACTATED RINGERS IV SOLN
INTRAVENOUS | Status: DC
  Administered 2013-04-29: 08:00:00 via INTRAVENOUS

## 2013-04-29 MED ORDER — MENTHOL 3 MG MT LOZG: 1.0000 | LOZENGE | OROMUCOSAL | Status: DC | PRN

## 2013-04-29 MED ORDER — PHENOL 1.4 % MT LIQD: 1.0000 | OROMUCOSAL | Status: DC | PRN

## 2013-04-29 MED ORDER — SODIUM CHLORIDE 0.9 % IJ SOLN: 3.0000 mL | INTRAMUSCULAR | Status: DC | PRN

## 2013-04-29 MED ORDER — HYDROCODONE-ACETAMINOPHEN 5-325 MG PO TABS: 1.0000 | ORAL_TABLET | ORAL | Status: DC | PRN

## 2013-04-29 MED ORDER — ONDANSETRON HCL 4 MG/2ML IJ SOLN: 4.0000 mg | Freq: Four times a day (QID) | INTRAMUSCULAR | Status: DC | PRN

## 2013-04-29 MED ORDER — ZOLPIDEM TARTRATE 5 MG PO TABS: 5.0000 mg | ORAL_TABLET | Freq: Every evening | ORAL | Status: DC | PRN

## 2013-04-29 MED ORDER — ALUM & MAG HYDROXIDE-SIMETH 200-200-20 MG/5ML PO SUSP: 30.0000 mL | Freq: Four times a day (QID) | ORAL | Status: DC | PRN

## 2013-04-29 MED ORDER — KETOROLAC TROMETHAMINE 30 MG/ML IJ SOLN
INTRAMUSCULAR | Status: AC
  Filled 2013-04-29: qty 1

## 2013-04-29 MED ORDER — LIDOCAINE-EPINEPHRINE 1 %-1:100000 IJ SOLN
INTRAMUSCULAR | Status: DC | PRN
  Administered 2013-04-29: 12.5 mL

## 2013-04-29 MED ORDER — ARTIFICIAL TEARS OP OINT
TOPICAL_OINTMENT | OPHTHALMIC | Status: DC | PRN
  Administered 2013-04-29: 1 via OPHTHALMIC

## 2013-04-29 MED ORDER — PROMETHAZINE HCL 25 MG/ML IJ SOLN: 6.2500 mg | INTRAMUSCULAR | Status: DC | PRN

## 2013-04-29 MED ORDER — NEOSTIGMINE METHYLSULFATE 1 MG/ML IJ SOLN
INTRAMUSCULAR | Status: DC | PRN
  Administered 2013-04-29: 4 mg via INTRAVENOUS

## 2013-04-29 MED ORDER — 0.9 % SODIUM CHLORIDE (POUR BTL) OPTIME
TOPICAL | Status: DC | PRN
  Administered 2013-04-29 (×3): 1000 mL

## 2013-04-29 MED ORDER — ACETAMINOPHEN 10 MG/ML IV SOLN
INTRAVENOUS | Status: AC
  Administered 2013-04-29: 1000 mg via INTRAVENOUS
  Filled 2013-04-29: qty 100

## 2013-04-29 MED ORDER — LACTATED RINGERS IV SOLN
INTRAVENOUS | Status: DC | PRN
  Administered 2013-04-29 (×3): via INTRAVENOUS

## 2013-04-29 MED ORDER — OXYCODONE-ACETAMINOPHEN 5-325 MG PO TABS: 1.0000 | ORAL_TABLET | ORAL | Status: DC | PRN

## 2013-04-29 MED ORDER — MEPERIDINE HCL 25 MG/ML IJ SOLN: 6.2500 mg | INTRAMUSCULAR | Status: DC | PRN

## 2013-04-29 MED ORDER — ONDANSETRON HCL 4 MG/2ML IJ SOLN
INTRAMUSCULAR | Status: DC | PRN
  Administered 2013-04-29: 4 mg via INTRAVENOUS

## 2013-04-29 MED ORDER — OXYCODONE HCL 5 MG/5ML PO SOLN: 5.0000 mg | Freq: Once | ORAL | Status: DC | PRN

## 2013-04-29 MED ORDER — MIDAZOLAM HCL 5 MG/5ML IJ SOLN
INTRAMUSCULAR | Status: DC | PRN
  Administered 2013-04-29: 1 mg via INTRAVENOUS

## 2013-04-29 MED ORDER — LIDOCAINE HCL (CARDIAC) 20 MG/ML IV SOLN
INTRAVENOUS | Status: DC | PRN
  Administered 2013-04-29: 20 mg via INTRAVENOUS

## 2013-04-29 MED ORDER — SODIUM CHLORIDE 0.9 % IJ SOLN
3.0000 mL | Freq: Two times a day (BID) | INTRAMUSCULAR | Status: DC
  Administered 2013-04-29 – 2013-04-30 (×2): 3 mL via INTRAVENOUS

## 2013-04-29 MED ORDER — CYCLOBENZAPRINE HCL 10 MG PO TABS: 10.0000 mg | ORAL_TABLET | Freq: Three times a day (TID) | ORAL | Status: DC | PRN

## 2013-04-29 MED ORDER — PROPOFOL 10 MG/ML IV BOLUS
INTRAVENOUS | Status: DC | PRN
  Administered 2013-04-29: 120 mg via INTRAVENOUS

## 2013-04-29 MED ORDER — THROMBIN 5000 UNITS EX SOLR
OROMUCOSAL | Status: DC | PRN
  Administered 2013-04-29: 14:00:00 via TOPICAL

## 2013-04-29 MED ORDER — FENTANYL CITRATE 0.05 MG/ML IJ SOLN
INTRAMUSCULAR | Status: DC | PRN
Start: 2013-04-29 — End: 2013-04-29
  Administered 2013-04-29 (×2): 50 ug via INTRAVENOUS
  Administered 2013-04-29: 250 ug via INTRAVENOUS
  Administered 2013-04-29: 25 ug via INTRAVENOUS

## 2013-04-29 MED ORDER — KETOROLAC TROMETHAMINE 30 MG/ML IJ SOLN
30.0000 mg | Freq: Once | INTRAMUSCULAR | Status: AC
  Administered 2013-04-29: 30 mg via INTRAVENOUS

## 2013-04-29 MED ORDER — SODIUM CHLORIDE 0.9 % IR SOLN
Status: DC | PRN
  Administered 2013-04-29: 11:00:00

## 2013-04-29 MED ORDER — SODIUM CHLORIDE 0.9 % IV SOLN: 250.0000 mL | INTRAVENOUS | Status: DC

## 2013-04-29 MED ORDER — ISOSORBIDE MONONITRATE ER 30 MG PO TB24
30.0000 mg | ORAL_TABLET | Freq: Two times a day (BID) | ORAL | Status: DC
  Administered 2013-04-29 – 2013-04-30 (×2): 30 mg via ORAL
  Filled 2013-04-29 (×3): qty 1

## 2013-04-29 MED ORDER — KCL IN DEXTROSE-NACL 20-5-0.45 MEQ/L-%-% IV SOLN
INTRAVENOUS | Status: DC
  Administered 2013-04-29: 22:00:00 via INTRAVENOUS
  Filled 2013-04-29 (×4): qty 1000

## 2013-04-29 MED ORDER — CEFAZOLIN SODIUM-DEXTROSE 2-3 GM-% IV SOLR
INTRAVENOUS | Status: AC
  Filled 2013-04-29: qty 50

## 2013-04-29 MED ORDER — VECURONIUM BROMIDE 10 MG IV SOLR
INTRAVENOUS | Status: DC | PRN
  Administered 2013-04-29: 2 mg via INTRAVENOUS

## 2013-04-29 MED ORDER — LATANOPROST 0.005 % OP SOLN
1.0000 [drp] | Freq: Every day | OPHTHALMIC | Status: DC
  Administered 2013-04-29: 1 [drp] via OPHTHALMIC
  Filled 2013-04-29: qty 2.5

## 2013-04-29 MED ORDER — KETOROLAC TROMETHAMINE 30 MG/ML IJ SOLN
15.0000 mg | Freq: Four times a day (QID) | INTRAMUSCULAR | Status: DC
  Administered 2013-04-29 – 2013-04-30 (×4): 15 mg via INTRAVENOUS
  Filled 2013-04-29 (×6): qty 1

## 2013-04-29 MED ORDER — HYDROMORPHONE HCL PF 1 MG/ML IJ SOLN: 0.2500 mg | INTRAMUSCULAR | Status: DC | PRN

## 2013-04-29 MED ORDER — ACETAMINOPHEN 650 MG RE SUPP: 650.0000 mg | RECTAL | Status: DC | PRN

## 2013-04-29 MED ORDER — EPHEDRINE SULFATE 50 MG/ML IJ SOLN
INTRAMUSCULAR | Status: DC | PRN
  Administered 2013-04-29: 5 mg via INTRAVENOUS
  Administered 2013-04-29 (×3): 10 mg via INTRAVENOUS

## 2013-04-29 MED FILL — Sodium Chloride IV Soln 0.9%: INTRAVENOUS | Qty: 1000 | Status: AC

## 2013-04-29 MED FILL — Heparin Sodium (Porcine) Inj 1000 Unit/ML: INTRAMUSCULAR | Qty: 30 | Status: AC

## 2013-04-29 SURGICAL SUPPLY — 90 items
ADH SKN CLS APL DERMABOND .7 (GAUZE/BANDAGES/DRESSINGS) ×2
ADH SKN CLS LQ APL DERMABOND (GAUZE/BANDAGES/DRESSINGS) ×3
APL SKNCLS STERI-STRIP NONHPOA (GAUZE/BANDAGES/DRESSINGS)
BAG DECANTER FOR FLEXI CONT (MISCELLANEOUS) ×2 IMPLANT
BENZOIN TINCTURE PRP APPL 2/3 (GAUZE/BANDAGES/DRESSINGS) IMPLANT
BLADE SURG ROTATE 9660 (MISCELLANEOUS) IMPLANT
BRUSH SCRUB EZ PLAIN DRY (MISCELLANEOUS) ×2 IMPLANT
BUR ACRON 5.0MM COATED (BURR) ×2 IMPLANT
BUR MATCHSTICK NEURO 3.0 LAGG (BURR) ×2 IMPLANT
CANISTER SUCT 3000ML (MISCELLANEOUS) ×2 IMPLANT
CAP LCK SPNE (Orthopedic Implant) ×6 IMPLANT
CAP LOCK SPINE RADIUS (Orthopedic Implant) IMPLANT
CAP LOCKING (Orthopedic Implant) ×12 IMPLANT
CONT SPEC 4OZ CLIKSEAL STRL BL (MISCELLANEOUS) ×5 IMPLANT
COVER BACK TABLE 24X17X13 BIG (DRAPES) IMPLANT
COVER TABLE BACK 60X90 (DRAPES) ×2 IMPLANT
CROSSLINK MEDIUM (Orthopedic Implant) ×1 IMPLANT
DERMABOND ADHESIVE PROPEN (GAUZE/BANDAGES/DRESSINGS) ×3
DERMABOND ADVANCED (GAUZE/BANDAGES/DRESSINGS) ×2
DERMABOND ADVANCED .7 DNX12 (GAUZE/BANDAGES/DRESSINGS) ×2 IMPLANT
DERMABOND ADVANCED .7 DNX6 (GAUZE/BANDAGES/DRESSINGS) IMPLANT
DRAPE C-ARM 42X72 X-RAY (DRAPES) ×4 IMPLANT
DRAPE LAPAROTOMY 100X72X124 (DRAPES) ×2 IMPLANT
DRAPE POUCH INSTRU U-SHP 10X18 (DRAPES) ×2 IMPLANT
DRAPE PROXIMA HALF (DRAPES) IMPLANT
DRSG EMULSION OIL 3X3 NADH (GAUZE/BANDAGES/DRESSINGS) IMPLANT
ELECT REM PT RETURN 9FT ADLT (ELECTROSURGICAL) ×2
ELECTRODE REM PT RTRN 9FT ADLT (ELECTROSURGICAL) ×1 IMPLANT
GAUZE SPONGE 4X4 16PLY XRAY LF (GAUZE/BANDAGES/DRESSINGS) ×1 IMPLANT
GLOVE BIO SURGEON STRL SZ8 (GLOVE) ×1 IMPLANT
GLOVE BIOGEL PI IND STRL 7.5 (GLOVE) IMPLANT
GLOVE BIOGEL PI IND STRL 8 (GLOVE) ×2 IMPLANT
GLOVE BIOGEL PI INDICATOR 7.5 (GLOVE) ×1
GLOVE BIOGEL PI INDICATOR 8 (GLOVE) ×6
GLOVE ECLIPSE 7.5 STRL STRAW (GLOVE) ×6 IMPLANT
GLOVE EXAM NITRILE LRG STRL (GLOVE) ×2 IMPLANT
GLOVE EXAM NITRILE MD LF STRL (GLOVE) IMPLANT
GLOVE EXAM NITRILE XL STR (GLOVE) IMPLANT
GLOVE EXAM NITRILE XS STR PU (GLOVE) IMPLANT
GLOVE INDICATOR 8.5 STRL (GLOVE) ×1 IMPLANT
GLOVE SURG SS PI 8.0 STRL IVOR (GLOVE) ×4 IMPLANT
GOWN BRE IMP SLV AUR LG STRL (GOWN DISPOSABLE) IMPLANT
GOWN BRE IMP SLV AUR XL STRL (GOWN DISPOSABLE) ×5 IMPLANT
GOWN STRL REIN 2XL LVL4 (GOWN DISPOSABLE) ×2 IMPLANT
KIT BASIN OR (CUSTOM PROCEDURE TRAY) ×2 IMPLANT
KIT INFUSE MEDIUM (Orthopedic Implant) ×1 IMPLANT
KIT ROOM TURNOVER OR (KITS) ×2 IMPLANT
MILL MEDIUM DISP (BLADE) ×2 IMPLANT
NDL 18GX1X1/2 (RX/OR ONLY) (NEEDLE) ×1 IMPLANT
NDL SPNL 18GX3.5 QUINCKE PK (NEEDLE) ×1 IMPLANT
NDL SPNL 22GX3.5 QUINCKE BK (NEEDLE) ×1 IMPLANT
NEEDLE 18GX1X1/2 (RX/OR ONLY) (NEEDLE) ×2 IMPLANT
NEEDLE BONE MARROW 8GAX6 (NEEDLE) ×1 IMPLANT
NEEDLE SPNL 18GX3.5 QUINCKE PK (NEEDLE) ×2 IMPLANT
NEEDLE SPNL 22GX3.5 QUINCKE BK (NEEDLE) ×2 IMPLANT
NS IRRIG 1000ML POUR BTL (IV SOLUTION) ×2 IMPLANT
PACK LAMINECTOMY NEURO (CUSTOM PROCEDURE TRAY) ×2 IMPLANT
PAD ARMBOARD 7.5X6 YLW CONV (MISCELLANEOUS) ×6 IMPLANT
PATTIES SURGICAL .5 X.5 (GAUZE/BANDAGES/DRESSINGS) ×1 IMPLANT
PATTIES SURGICAL .5 X1 (DISPOSABLE) IMPLANT
PATTIES SURGICAL 1X1 (DISPOSABLE) ×1 IMPLANT
PEEK PLIF AVS 11X25X4 (Peek) ×2 IMPLANT
PEEK PLIF AVS 9X25X4 (Peek) ×2 IMPLANT
ROD 80MM (Rod) ×4 IMPLANT
ROD SPNL 80X5.5 NS TI RDS (Rod) IMPLANT
SCREW 5.75X45MM (Screw) ×1 IMPLANT
SCREW 5.75X50MM (Screw) ×1 IMPLANT
SCREW 6.75X45MM (Screw) ×4 IMPLANT
SPONGE GAUZE 4X4 12PLY (GAUZE/BANDAGES/DRESSINGS) ×2 IMPLANT
SPONGE LAP 4X18 X RAY DECT (DISPOSABLE) ×1 IMPLANT
SPONGE NEURO XRAY DETECT 1X3 (DISPOSABLE) ×1 IMPLANT
SPONGE SURGIFOAM ABS GEL 100 (HEMOSTASIS) IMPLANT
STAPLER SKIN PROX WIDE 3.9 (STAPLE) IMPLANT
STRIP BIOACTIVE VITOSS 25X100X (Neuro Prosthesis/Implant) ×2 IMPLANT
STRIP CLOSURE SKIN 1/2X4 (GAUZE/BANDAGES/DRESSINGS) IMPLANT
SUT PROLENE 6 0 BV (SUTURE) IMPLANT
SUT VIC AB 1 CT1 18XBRD ANBCTR (SUTURE) ×2 IMPLANT
SUT VIC AB 1 CT1 8-18 (SUTURE) ×6
SUT VIC AB 2-0 CP2 18 (SUTURE) ×5 IMPLANT
SYR 20CC LL (SYRINGE) ×2 IMPLANT
SYR 3ML LL SCALE MARK (SYRINGE) ×8 IMPLANT
SYR 5ML LL (SYRINGE) IMPLANT
SYR CONTROL 10ML LL (SYRINGE) ×2 IMPLANT
TAPE CLOTH SURG 4X10 WHT LF (GAUZE/BANDAGES/DRESSINGS) ×1 IMPLANT
TOWEL OR 17X24 6PK STRL BLUE (TOWEL DISPOSABLE) ×2 IMPLANT
TOWEL OR 17X26 10 PK STRL BLUE (TOWEL DISPOSABLE) ×2 IMPLANT
TRAP SPECIMEN MUCOUS 40CC (MISCELLANEOUS) IMPLANT
TRAY FOLEY CATH 14FRSI W/METER (CATHETERS) ×2 IMPLANT
TRAY FOLEY CATH 16FRSI W/METER (SET/KITS/TRAYS/PACK) ×1 IMPLANT
WATER STERILE IRR 1000ML POUR (IV SOLUTION) ×2 IMPLANT

## 2013-04-29 NOTE — Op Note (Signed)
04/29/2013  2:53 PM  PATIENT:  Terry Garner  72 y.o. 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, lumbar spondylosis  PROCEDURE:  Procedure(s): POSTERIOR LUMBAR FUSION 2 LEVEL:  L3-L5 decompressive lumbar laminectomy, bilateral L3-4 and L4-5 facetectomy, bilateral L3, L4, and L5 laminotomy, bilateral L3-4 and L4-5 microdiscectomy, with decompression of the central canal and thecal sac stenosis, and decompression of the bilateral neural foraminal and bilateral L3, L4, and L5 stenotic nerve root compression, with decompression beyond that required for interbody arthrodesis; bilateral L3-4 and L4-5 posterior lumbar interbody arthrodesis with AVS peek interbody implants, Vitoss BA with bone marrow aspirate, and infuse; bilateral L3-L5 posterolateral arthrodesis with radius segmental posterior instrumentation, Vitoss BA with bone marrow aspirate, locally harvested morcellized autograft, and infuse  SURGEON:  Surgeon(s): Hosie Spangle, MD Elaina Hoops, MD  ASSISTANTS: Kary Kos, M.D.  ANESTHESIA:   general  EBL:  Total I/O In: 2630 [I.V.:2000; Blood:130; IV Piggyback:500] Out: 74 [Urine:370; Blood:400]  BLOOD ADMINISTERED:130 CC CELLSAVER  COUNT: Correct per nursing staff  DICTATION: Patient was brought to the operating room placed under general endotracheal anesthesia. The patient was turned to prone position, the lumbar region was prepped with Betadine soap and solution and draped in a sterile fashion. The midline was infiltrated with local anesthesia with epinephrine. A localizing x-ray was taken and then a midline incision was made and carried down through the subcutaneous tissue, bipolar cautery and electrocautery  were used to maintain hemostasis. Dissection was carried down to the lumbar fascia. The fascia was incised bilaterally and the paraspinal muscles were dissected with a spinous process and lamina in a subperiosteal fashion. Another x-ray was taken for localization and the L3-4 and L4-5 levels were localized. Dissection was then carried out laterally over the facet complexes and the transverse processes of L3, L4, and L5 were exposed and decorticated. Begin the decompression with a L3-L5 decompressive lumbar laminectomy, using double-action rongeurs, the high-speed drill, and Kerrison punches. The margins of the previous left L4-5 laminotomy were carefully defined, dissecting the scar tissue from the bony edges. An inferior L3, complete L4, and superior L5 laminectomy was completed. We then continued the decompression laterally performing a bilateral L3-4 and L4-5 facetectomy. We identified the exiting L3, L4, and L5 nerve roots. The overlying systolic overgrowth, ligamentum flavum, and bony overgrowth was carefully removed; carefully decompressing the stenotic decompression of the exiting nerve roots.  Once the decompression of the stenotic compression of the thecal sac and exiting nerve roots was completed we proceeded with the posterior lumbar interbody arthrodesis. The annulus at each level was incised bilaterally and the disc space entered. A thorough discectomy was performed using pituitary rongeurs and curettes. At the L4-5 level the large recurrent disc herniation was removed, and thereby decompressing the thecal sac and nerve roots. Once the discectomy was completed we began to prepare the endplate surfaces, removing the cartilaginous endplates surface with paddle curettes. We then measured the height of the intervertebral disc space. We selected 9 x 25 x 4 AVS peek interbody implants for the L3-4 level, and 11 x 25 x 4 AVS peek interbody implants for the L4-5 level.  The C-arm fluoroscope was then draped  and brought in the field and we identified the pedicle entry points bilaterally at the L3, L4, and L5 levels. Each  of the 6 pedicles was probed, we aspirated bone marrow aspirate from the vertebral bodies, this was injected over 2 10 cc strips of Vitoss BA. Then each of the pedicles was examined with the ball probe, good bony surfaces were found and no bony cuts were found. The L4 and L5 pedicles were then tapped with a 6.25 mm tap, the L3 pedicles were tapped with a 5.25 mm tap, all of these were examined with the ball probe, good threading was found and no bony cuts were found. We then placed 5.75 by 45 millimeter screw on the left at the L3 level, and a 5.75 x 50 mm screw on the right at the L3 level, 6.75 by 45 millimeter screws bilaterally at the L4 level, and 6.75 by 45 millimeter screws bilaterally at the L5 level.  We then packed the AVS peek interbody implants with Vitoss BA with bone marrow aspirate and infuse, and then placed the first implant at the L4-5 level on the right side, carefully retracting the thecal sac and nerve root medially. We then went back to the left side and packed the midline with additional Vitoss BA with bone marrow aspirate and infuse, and then placed a second implant on the left side again retracting the thecal sac and nerve root medially. Additional Vitoss BA with bone marrow aspirate and infuse was packed lateral to the implants.  Then at the L3-4 level, we placed the first implant on the right side, carefully retracting the thecal sac and nerve root medially. We then went back to the left side and packed the midline with additional Vitoss BA with bone marrow aspirate and infuse, and then placed a second implant on the left side, again retracting the thecal sac and nerve root medially. Additional Vitoss BA with bone marrow aspirate and infuse was packed lateral to the implants.   We then packed the lateral gutter over the transverse processes and intertransverse space with  Vitoss BA with bone marrow aspirate, locally harvested morcellized autograft, and infuse. We then selected pre-lordosed 80 mm rods, they were placed within the screw heads and secured with locking caps once all 6 locking caps were placed final tightening was performed against a counter torque. A variable medium cross connector was placed between the screws at L3 and L4 bilaterally.  The wound had been irrigated multiple times during the procedure with saline solution and bacitracin solution, good hemostasis was established with a combination of bipolar cautery and Gelfoam with thrombin. Once good hemostasis was confirmed we proceeded with closure paraspinal muscles deep fascia and Scarpa's fascia were closed with interrupted undyed 1 Vicryl sutures the subcutaneous and subcuticular closed with interrupted inverted 2-0 undyed Vicryl sutures the skin edges were approximated with Dermabond.  The wound was dressed with gauze and Hypafix.  Following surgery the patient was turned back to the supine position to be reversed and the anesthetic extubated and transferred to the recovery room for further care.    PLAN OF CARE: Admit to inpatient   PATIENT DISPOSITION:  PACU - hemodynamically stable.   Delay start of Pharmacological VTE agent (>24hrs) due to surgical blood loss or risk of bleeding:  yes

## 2013-04-29 NOTE — Anesthesia Preprocedure Evaluation (Addendum)
Anesthesia Evaluation  Patient identified by MRN, date of birth, ID band Patient awake    Reviewed: Allergy & Precautions, H&P , NPO status , Patient's Chart, lab work & pertinent test results  History of Anesthesia Complications Negative for: history of anesthetic complications  Airway Mallampati: I TM Distance: >3 FB Neck ROM: Full    Dental  (+) Teeth Intact and Dental Advisory Given   Pulmonary neg pulmonary ROS,  breath sounds clear to auscultation  Pulmonary exam normal       Cardiovascular hypertension, Pt. on medications - angina+ CAD (S/p stent x3'10 cath: stents patent, otherwise non-obstructive), + Cardiac Stents and + Peripheral Vascular Disease (AAA 3.22 x 3.17) + Valvular Problems/Murmurs (mild AS) AS Rhythm:Regular Rate:Normal     Neuro/Psych Carotid stenosis, ASx CVA (L eye), Residual Symptoms    GI/Hepatic negative GI ROS, Neg liver ROS,   Endo/Other  negative endocrine ROS  Renal/GU Renal InsufficiencyRenal disease (creat 2.11)     Musculoskeletal   Abdominal   Peds  Hematology negative hematology ROS (+)   Anesthesia Other Findings   Reproductive/Obstetrics                         Anesthesia Physical Anesthesia Plan  ASA: III  Anesthesia Plan: General   Post-op Pain Management:    Induction: Intravenous  Airway Management Planned: Oral ETT  Additional Equipment:   Intra-op Plan:   Post-operative Plan: Extubation in OR  Informed Consent: I have reviewed the patients History and Physical, chart, labs and discussed the procedure including the risks, benefits and alternatives for the proposed anesthesia with the patient or authorized representative who has indicated his/her understanding and acceptance.   Dental advisory given  Plan Discussed with: CRNA and Surgeon  Anesthesia Plan Comments: (Plan routine monitors, GETA)        Anesthesia Quick  Evaluation

## 2013-04-29 NOTE — Preoperative (Signed)
Beta Blockers   Reason not to administer Beta Blockers:Not Applicable 

## 2013-04-29 NOTE — H&P (Signed)
Subjective: Patient is a 72 y.o. male who is admitted for treatment of left lumbar radiculopathy secondary to multi-level multifactorial lumbar stenosis and a large recurrent left L4-5 lumbar disc herniation.  Patient is 11 months status post a left L4-5 lumbar discectomy by Dr. Lorin Mercy, he had no relief of his left lumbar radicular pain. Followup MRI scan reveals lumbar stenosis at the L3-4 and L4-5 levels with a superimposed left L4-5 recurrent disc herniation.  Patient admitted now for a L3-4 and L4-5 lumbar decompression including laminectomy, facetectomy, foraminotomy, and discectomy, and a bilateral L3-4 and L4-5 posterior lumbar interbody arthrodesis with interbody implants and bone graft, and a bilateral L3-L5 posterior lateral arthrodesis with posterior instrumentation and bone graft.   Patient Active Problem List   Diagnosis Date Noted  . Coronary artery disease 01/15/2013  . Carotid artery disease 01/15/2013  . Right bundle branch block 01/15/2013  . Essential hypertension 01/15/2013  . Hyperlipidemia 01/15/2013  . Herniated lumbar intervertebral disc 06/06/2012   Past Medical History  Diagnosis Date  . Hypertension   . Heart murmur   . Arthritis   . Chronic kidney disease     CKD, right renal artery stenosis  . AAA (abdominal aortic aneurysm)     3.6 cm (01/09/12 ultrasound)  . Carotid artery disease   . Coronary artery disease     Past Surgical History  Procedure Laterality Date  . Cardiac catheterization      stents  last -07  . Hernia repair  58    rt  . Joint replacement      lft hip  . Shoulder arthroscopy      rt  . Cervical disc surgery    . Lumbar laminectomy  06/06/2012  . Cardiac stents    . Lumbar laminectomy  06/06/2012    Procedure: MICRODISCECTOMY LUMBAR LAMINECTOMY;  Surgeon: Marybelle Killings, MD;  Location: [place];  Service: Orthopedics;  Laterality: Left;  Left L4-5 Microdiscectomy  . Back surgery    . Eye surgery  06    retinal detachment     Prescriptions prior to admission  Medication Sig Dispense Refill  . allopurinol (ZYLOPRIM) 300 MG tablet Take 150 mg by mouth daily.      [name] aspirin EC 81 MG tablet Take 81 mg by mouth daily.      . Cholecalciferol (VITAMIN D3) 2000 UNITS TABS Take 1 tablet by mouth 2 (two) times daily.      . clopidogrel (PLAVIX) 75 MG tablet Take 75 mg by mouth daily.      . fenofibrate micronized (LOFIBRA) 134 MG capsule Take 134 mg by mouth daily before breakfast.      . isosorbide mononitrate (IMDUR) 30 MG 24 hr tablet Take 30 mg by mouth 2 (two) times daily.      [name] latanoprost (XALATAN) 0.005 % ophthalmic solution       . pravastatin (PRAVACHOL) 40 MG tablet Take 20 mg by mouth daily.       Allergies  Allergen Reactions  . Pravastatin Other (See Comments)    Memory issues, myalgias  . Toprol Xl [Metoprolol Tartrate] Other (See Comments)    Bradycardia with beta blockers  . Coumadin [Warfarin Sodium] Rash  . Tape Rash    Clear tape    History  Substance Use Topics  . Smoking status: Never Smoker   . Smokeless tobacco: Never Used  . Alcohol Use: No    History reviewed. No pertinent family history.   Review of Systems A  comprehensive review of systems was negative.  Objective: Vital signs in last 24 hours: Temp:  [97 F (36.1 C)] 97 F (36.1 C) (11/05 0639) Pulse Rate:  [48] 48 (11/05 0639) Resp:  [18] 18 (11/05 0639) BP: (180)/(61) 180/61 mmHg (11/05 0639) SpO2:  [100 %] 100 % (11/05 0639)  EXAM: Patient well-developed well-nourished white male in no acute distress. Lungs are clear to auscultation , the patient has symmetrical respiratory excursion. Heart has a regular rate and rhythm normal S1 and S2 no murmur.   Abdomen is soft nontender nondistended bowel sounds are present. Extremity examination shows no clubbing cyanosis or edema. Motor examination shows 5 over 5 strength in the lower extremities including the iliopsoas quadriceps dorsiflexor extensor hallicus  longus and plantar  flexor bilaterally. Sensation is intact to pinprick in the distal lower extremities. Reflexes are symmetrical bilaterally. No pathologic reflexes are present. Patient has a normal gait and stance.   Data Review:CBC    Component Value Date/Time   WBC 5.5 04/23/2013 0916   RBC 4.08* 04/23/2013 0916   HGB 12.3* 04/23/2013 0916   HCT 35.6* 04/23/2013 0916   PLT 196 04/23/2013 0916   MCV 87.3 04/23/2013 0916   MCH 30.1 04/23/2013 0916   MCHC 34.6 04/23/2013 0916   RDW 14.3 04/23/2013 0916   LYMPHSABS 0.9 06/07/2009 0825   MONOABS 0.3 06/07/2009 0825   EOSABS 0.2 06/07/2009 0825   BASOSABS 0.0 06/07/2009 0825                          BMET    Component Value Date/Time   NA 141 04/23/2013 0916   K 4.4 04/23/2013 0916   CL 106 04/23/2013 0916   CO2 25 04/23/2013 0916   GLUCOSE 100* 04/23/2013 0916   BUN 23 04/23/2013 0916   CREATININE 2.11* 04/23/2013 0916   CALCIUM 9.6 04/23/2013 0916   GFRNONAA 29* 04/23/2013 0916   GFRAA 34* 04/23/2013 0916     Assessment/Plan: Patient with lumbar stenosis at L3-4 and L4-5 and a large recurrent left L4-5 lumbar disc herniation, and is admitted now for lumbar decompression and arthrodesis.  I've discussed with the patient the nature of his condition, the nature the surgical procedure, the typical length of surgery, hospital stay, and overall recuperation, the limitations postoperatively, and risks of surgery. I discussed risks including risks of infection, bleeding, possibly need for transfusion, the risk of nerve root dysfunction with pain, weakness, numbness, or paresthesias, the risk of dural tear and CSF leakage and possible need for further surgery, the risk of failure of the arthrodesis and possibly for further surgery, the risk of anesthetic complications including myocardial infarction, stroke, pneumonia, and death. We discussed the need for postoperative immobilization in a lumbar brace. Understanding all this the patient does wish to proceed  with surgery and is admitted for such.     Hosie Spangle, MD 04/29/2013 9:34 AM

## 2013-04-29 NOTE — Transfer of Care (Signed)
Immediate Anesthesia Transfer of Care Note  Patient: [name]  Procedure(s) Performed: Procedure(s) with comments: POSTERIOR LUMBAR FUSION 2 LEVEL (N/A) - L34 L45 decompression with posterior lumbar interbody fusion with interbody prosthesis posterior lateral arthrodesis and posterior segmental instrumentation  Patient Location: PACU  Anesthesia Type:General  Level of Consciousness: awake, alert  and patient cooperative  Airway & Oxygen Therapy: Patient Spontanous Breathing and Patient connected to nasal cannula oxygen  Post-op Assessment: Report given to PACU RN, Post -op Vital signs reviewed and stable and Patient moving all extremities X 4  Post vital signs: Reviewed and stable  Complications: No apparent anesthesia complications

## 2013-04-29 NOTE — Progress Notes (Signed)
Filed Vitals:   04/29/13 1700 04/29/13 1717 04/29/13 1748 04/29/13 1749  BP: 187/52 189/59  162/66  Pulse: 71 71  74  Temp:  97 F (36.1 C)  98.6 F (37 C)  TempSrc:      Resp: 14 16  16   Height:   6' 3.2" (1.91 m)   Weight:   99.7 kg (219 lb 12.8 oz)   SpO2: 97% 97%  93%    Patient resting comfortably in bed, sitting up eating dinner. Denies pain or discomfort. Dressing clean and dry. Foley to straight drainage. Moving all extremities well.  Plan: Progress through postoperative recovery.  Hosie Spangle, MD 04/29/2013, 6:49 PM

## 2013-04-29 NOTE — Anesthesia Postprocedure Evaluation (Signed)
**Note Terry-Identified via Obfuscation**   Anesthesia Post-op Note  Patient: Terry Garner  Procedure(s) Performed: Procedure(s) with comments: POSTERIOR LUMBAR FUSION 2 LEVEL (N/A) - L34 L45 decompression with posterior lumbar interbody fusion with interbody prosthesis posterior lateral arthrodesis and posterior segmental instrumentation  Patient Location: PACU  Anesthesia Type:General  Level of Consciousness: awake, alert , oriented and patient cooperative  Airway and Oxygen Therapy: Patient Spontanous Breathing and Patient connected to nasal cannula oxygen  Post-op Pain: mild  Post-op Assessment: Post-op Vital signs reviewed, Patient's Cardiovascular Status Stable, Respiratory Function Stable, Patent Airway, No signs of Nausea or vomiting and Pain level controlled  Post-op Vital Signs: Reviewed and stable  Complications: No apparent anesthesia complications

## 2013-04-29 NOTE — Anesthesia Procedure Notes (Signed)
Procedure Name: Intubation Date/Time: 04/29/2013 9:46 AM Performed by: Hosie Spangle Pre-anesthesia Checklist: Patient identified, Timeout performed, Emergency Drugs available, Suction available and Patient being monitored Patient Re-evaluated:Patient Re-evaluated prior to inductionOxygen Delivery Method: Circle system utilized Preoxygenation: Pre-oxygenation with 100% oxygen Intubation Type: IV induction Ventilation: Mask ventilation without difficulty Laryngoscope Size: Mac and 4 Grade View: Grade II Tube type: Oral Tube size: 7.5 mm Number of attempts: 1 Airway Equipment and Method: Stylet Placement Confirmation: ETT inserted through vocal cords under direct vision,  breath sounds checked- equal and bilateral and positive ETCO2 Secured at: 23 cm Tube secured with: Tape Dental Injury: Teeth and Oropharynx as per pre-operative assessment  Comments: Smooth IV induction by Dr Glennon Mac; Easy atraumatic intubation by Bethann Humble CRNA.

## 2013-04-30 MED ORDER — OXYCODONE-ACETAMINOPHEN 5-325 MG PO TABS: 1.0000 | ORAL_TABLET | ORAL | Status: DC | PRN

## 2013-04-30 NOTE — Discharge Summary (Signed)
Physician Discharge Summary  Patient ID: ISSAK BRANNON MRN: [id number] DOB/AGE: [birth year] 74 y.o.  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 Postoperatively he has done very well. He has had minimal discomfort. He is up and ambulate. His dressing was removed, and his wound is healing nicely. He is being discharged home with instructions regarding wound care and activities. He is to return for followup with me in 3 weeks.  Discharge Exam: Blood pressure 133/71, pulse 58, temperature 98.6 F (37 C), temperature source Oral, resp. rate 20, height 6' 3.2" (1.91 m), weight 99.7 kg (219 lb 12.8 oz), SpO2 92.00%.  Disposition: Home   Future Appointments Provider Department Dept Phone   10/30/2013 7:45 AM [name], MD [place] [phone]       Medication List         allopurinol 300 MG tablet  Commonly known as:  ZYLOPRIM  Take 150 mg by mouth daily.     aspirin EC 81 MG tablet  Take 81 mg by mouth daily.     clopidogrel 75 MG tablet  Commonly known as:  PLAVIX  Take 75 mg by mouth daily.     fenofibrate micronized 134 MG capsule  Commonly known as:  LOFIBRA  Take 134 mg by mouth daily before breakfast.     isosorbide mononitrate 30 MG 24 hr tablet  Commonly known as:  IMDUR  Take 30 mg by mouth 2 (two) times daily.     latanoprost 0.005 % ophthalmic solution  Commonly known as:  XALATAN     oxyCODONE-acetaminophen 5-325 MG per tablet  Commonly known as:  PERCOCET/ROXICET  Take 1-2  tablets by mouth every 4 (four) hours as needed for moderate pain.     pravastatin 40 MG tablet  Commonly known as:  PRAVACHOL  Take 20 mg by mouth daily.     Vitamin D3 2000 UNITS Tabs  Take 1 tablet by mouth 2 (two) times daily.         Signed: Hosie Spangle, MD 04/30/2013, 2:49 PM

## 2013-06-09 ENCOUNTER — Other Ambulatory Visit: Payer: Self-pay | Admitting: Emergency Medicine

## 2013-06-09 ENCOUNTER — Ambulatory Visit (INDEPENDENT_AMBULATORY_CARE_PROVIDER_SITE_OTHER): Payer: Medicare Other | Admitting: Emergency Medicine

## 2013-06-09 ENCOUNTER — Encounter: Payer: Self-pay | Admitting: Emergency Medicine

## 2013-06-09 VITALS — BP 152/80 | HR 68 | Temp 98.4°F | Resp 16 | Ht 73.75 in | Wt 215.0 lb

## 2013-06-09 DIAGNOSIS — R05 Cough: Secondary | ICD-10-CM

## 2013-06-09 DIAGNOSIS — I10 Essential (primary) hypertension: Secondary | ICD-10-CM

## 2013-06-09 DIAGNOSIS — R059 Cough, unspecified: Secondary | ICD-10-CM

## 2013-06-09 LAB — CBC WITH DIFFERENTIAL/PLATELET
Basophils Absolute: 0 10*3/uL (ref 0.0–0.1)
Eosinophils Absolute: 0.3 10*3/uL (ref 0.0–0.7)
Eosinophils Relative: 7 % — ABNORMAL HIGH (ref 0–5)
HCT: 31.5 % — ABNORMAL LOW (ref 39.0–52.0)
Lymphocytes Relative: 24 % (ref 12–46)
Lymphs Abs: 0.9 10*3/uL (ref 0.7–4.0)
MCH: 28.8 pg (ref 26.0–34.0)
MCHC: 33.3 g/dL (ref 30.0–36.0)
MCV: 86.3 fL (ref 78.0–100.0)
Monocytes Absolute: 0.3 10*3/uL (ref 0.1–1.0)
Neutrophils Relative %: 59 % (ref 43–77)
Platelets: 215 10*3/uL (ref 150–400)
RDW: 15.3 % (ref 11.5–15.5)
WBC: 3.8 10*3/uL — ABNORMAL LOW (ref 4.0–10.5)

## 2013-06-09 MED ORDER — BENZONATATE 100 MG PO CAPS: 100.0000 mg | ORAL_CAPSULE | Freq: Three times a day (TID) | ORAL | Status: DC | PRN

## 2013-06-09 NOTE — Patient Instructions (Signed)
Hypertension Hypertension is another name for high blood pressure. High blood pressure may mean that your heart needs to work harder to pump blood. Blood pressure consists of two numbers, which includes a higher number over a lower number (example: 110/72). HOME CARE   Make lifestyle changes as told by your doctor. This may include weight loss and exercise.  Take your blood pressure medicine every day.  Limit how much salt you use.  Stop smoking if you smoke.  Do not use drugs.  Talk to your doctor if you are using decongestants or birth control pills. These medicines might make blood pressure higher.  Females should not drink more than 1 alcoholic drink per day. Males should not drink more than 2 alcoholic drinks per day.  See your doctor as told. GET HELP RIGHT AWAY IF:   You have a blood pressure reading with a top number of 180 or higher.  You get a very bad headache.  You get blurred or changing vision.  You feel confused.  You feel weak, numb, or faint.  You get chest or belly (abdominal) pain.  You throw up (vomit).  You cannot breathe very well. MAKE SURE YOU:   Understand these instructions.  Will watch your condition.  Will get help right away if you are not doing well or get worse. Document Released: 11/28/2007 Document Revised: 09/03/2011 Document Reviewed: 11/28/2007 [facility] Patient Information 2014 [place], [place]. Cough, Adult  A cough is a reflex. It helps you clear your throat and airways. A cough can help heal your body. A cough can last 2 or 3 weeks (acute) or may last more than 8 weeks (chronic). Some common causes of a cough can include an infection, allergy, or a cold. HOME CARE  Only take medicine as told by your doctor.  If given, take your medicines (antibiotics) as told. Finish them even if you start to feel better.  Use a cold steam vaporizer or humidier in your home. This can help loosen thick spit (secretions).  Sleep so you are  almost sitting up (semi-upright). Use pillows to do this. This helps reduce coughing.  Rest as needed.  Stop smoking if you smoke. GET HELP RIGHT AWAY IF:  You have yellowish-white fluid (pus) in your thick spit.  Your cough gets worse.  Your medicine does not reduce coughing, and you are losing sleep.  You cough up blood.  You have trouble breathing.  Your pain gets worse and medicine does not help.  You have a fever. MAKE SURE YOU:   Understand these instructions.  Will watch your condition.  Will get help right away if you are not doing well or get worse. Document Released: 02/22/2011 Document Revised: 09/03/2011 Document Reviewed: 02/22/2011 [facility] Patient Information 2014 [place].

## 2013-06-10 ENCOUNTER — Other Ambulatory Visit: Payer: Self-pay | Admitting: Emergency Medicine

## 2013-06-10 LAB — BASIC METABOLIC PANEL WITH GFR
BUN: 24 mg/dL — ABNORMAL HIGH (ref 6–23)
CO2: 24 mEq/L (ref 19–32)
Calcium: 9.2 mg/dL (ref 8.4–10.5)
Chloride: 111 mEq/L (ref 96–112)
Creat: 1.89 mg/dL — ABNORMAL HIGH (ref 0.50–1.35)
GFR, Est Non African American: 34 mL/min — ABNORMAL LOW

## 2013-06-10 MED ORDER — AMLODIPINE BESYLATE 5 MG PO TABS: 5.0000 mg | ORAL_TABLET | Freq: Every day | ORAL | Status: DC

## 2013-06-10 NOTE — Progress Notes (Signed)
Subjective:    Patient ID: Terry Garner, male    DOB: [birth year], 72 y.o.   MRN: [id number]  HPI Comments: 72 yo male with 3 days of cough , sinus drainage with occasional production both always clear. He has not tried any OTC. He also notes his wife is sick, "worse than he is". He did have recent back surgery 3 weeks ago and notes that all symptoms to be improving.  He notes BP has been mildly elevated at home and is not sure which medicines he is taking. He denies any elevated BP symptoms.  Cough Associated symptoms include postnasal drip.     Current Outpatient Prescriptions on File Prior to Visit  Medication Sig Dispense Refill  . allopurinol (ZYLOPRIM) 300 MG tablet Take 150 mg by mouth daily.      [name] aspirin EC 81 MG tablet Take 81 mg by mouth daily.      . Cholecalciferol (VITAMIN D3) 2000 UNITS TABS Take 1 tablet by mouth 2 (two) times daily.      . clopidogrel (PLAVIX) 75 MG tablet Take 75 mg by mouth daily.      . fenofibrate micronized (LOFIBRA) 134 MG capsule Take 134 mg by mouth daily before breakfast.      . pravastatin (PRAVACHOL) 40 MG tablet Take 20 mg by mouth daily.      . isosorbide mononitrate (IMDUR) 30 MG 24 hr tablet Take 30 mg by mouth 2 (two) times daily.      [name] latanoprost (XALATAN) 0.005 % ophthalmic solution       . oxyCODONE-acetaminophen (PERCOCET/ROXICET) 5-325 MG per tablet Take 1-2 tablets by mouth every 4 (four) hours as needed for moderate pain.  70 tablet  0   No current facility-administered medications on file prior to visit.   ALLERGIES Pravastatin; Toprol xl; Coumadin; and Tape  Past Medical History  Diagnosis Date  . Hypertension   . Heart murmur   . Arthritis   . Chronic kidney disease     CKD, right renal artery stenosis  . AAA (abdominal aortic aneurysm)     3.6 cm (01/09/12 ultrasound)  . Carotid artery disease   . Coronary artery disease     Review of Systems  HENT: Positive for congestion and postnasal drip.   Respiratory:  Positive for cough.     BP 152/80  Pulse 68  Temp(Src) 98.4 F (36.9 C) (Temporal)  Resp 16  Ht 6' 1.75" (1.873 m)  Wt 215 lb (97.523 kg)  BMI 27.80 kg/m2     Objective:   Physical Exam  Nursing note and vitals reviewed. Constitutional: He is oriented to person, place, and time. He appears well-developed and well-nourished.  HENT:  Head: Normocephalic and atraumatic.  Right Ear: External ear normal.  Left Ear: External ear normal.  Nose: Nose normal.  Mouth/Throat: Oropharynx is clear and moist. No oropharyngeal exudate.  Cloudy TMs  Eyes: Conjunctivae are normal.  Neck: Normal range of motion.  Cardiovascular: Normal rate, regular rhythm, normal heart sounds and intact distal pulses.   Pulmonary/Chest: Effort normal and breath sounds normal.  Abdominal: Soft.  Musculoskeletal: Normal range of motion.  Lymphadenopathy:    He has no cervical adenopathy.  Neurological: He is alert and oriented to person, place, and time.  Skin: Skin is warm and dry.  Psychiatric: He has a normal mood and affect. Judgment normal.          Assessment & Plan:  1. HTN- patient w/c and verify if  taking Norvasc, if not will need to restart, check BP if >140/80 call, check labs 2. Cough probably viral vs allergic rhinitis- Tessalon perles 100 mg AD, Add Allegra OTC AD, increase H2o, check labs, w/c if symptoms increase

## 2013-06-11 LAB — VITAMIN B12: Vitamin B-12: 263 pg/mL (ref 211–911)

## 2013-06-11 LAB — IRON AND TIBC: %SAT: 13 % — ABNORMAL LOW (ref 20–55)

## 2013-06-12 ENCOUNTER — Telehealth: Payer: Self-pay | Admitting: *Deleted

## 2013-06-12 NOTE — Telephone Encounter (Signed)
Message copied by Bradly Bienenstock A on Fri Jun 12, 2013 10:01 AM ------      Message from: [place], [place] R      Created: Fri Jun 12, 2013  5:45 AM       Hgb mildly low need to send stool cards and complete then start super B complex and slow fe, BOTH IRON and Vitamin B  are low. Increase H2o. Recheck at 07/17/13 OV ------

## 2013-06-12 NOTE — Telephone Encounter (Signed)
Spoke with patient about lab result ans instructions.

## 2013-07-16 DIAGNOSIS — M109 Gout, unspecified: Secondary | ICD-10-CM | POA: Insufficient documentation

## 2013-07-16 DIAGNOSIS — E559 Vitamin D deficiency, unspecified: Secondary | ICD-10-CM | POA: Insufficient documentation

## 2013-07-17 ENCOUNTER — Other Ambulatory Visit: Payer: Self-pay | Admitting: *Deleted

## 2013-07-17 ENCOUNTER — Encounter: Payer: Self-pay | Admitting: Internal Medicine

## 2013-07-17 ENCOUNTER — Ambulatory Visit (INDEPENDENT_AMBULATORY_CARE_PROVIDER_SITE_OTHER): Payer: Medicare Other | Admitting: Internal Medicine

## 2013-07-17 VITALS — BP 150/68 | HR 52 | Temp 97.9°F | Resp 16 | Wt 214.6 lb

## 2013-07-17 DIAGNOSIS — I10 Essential (primary) hypertension: Secondary | ICD-10-CM

## 2013-07-17 DIAGNOSIS — Z79899 Other long term (current) drug therapy: Secondary | ICD-10-CM

## 2013-07-17 DIAGNOSIS — E782 Mixed hyperlipidemia: Secondary | ICD-10-CM

## 2013-07-17 DIAGNOSIS — R7309 Other abnormal glucose: Secondary | ICD-10-CM

## 2013-07-17 DIAGNOSIS — E559 Vitamin D deficiency, unspecified: Secondary | ICD-10-CM

## 2013-07-17 DIAGNOSIS — E611 Iron deficiency: Secondary | ICD-10-CM

## 2013-07-17 LAB — CBC WITH DIFFERENTIAL/PLATELET
BASOS ABS: 0 10*3/uL (ref 0.0–0.1)
BASOS PCT: 0 % (ref 0–1)
EOS ABS: 0.2 10*3/uL (ref 0.0–0.7)
EOS PCT: 3 % (ref 0–5)
HCT: 33.6 % — ABNORMAL LOW (ref 39.0–52.0)
Hemoglobin: 11.3 g/dL — ABNORMAL LOW (ref 13.0–17.0)
LYMPHS PCT: 22 % (ref 12–46)
Lymphs Abs: 1.2 10*3/uL (ref 0.7–4.0)
MCH: 28.3 pg (ref 26.0–34.0)
MCHC: 33.6 g/dL (ref 30.0–36.0)
MCV: 84.2 fL (ref 78.0–100.0)
MONO ABS: 0.2 10*3/uL (ref 0.1–1.0)
Monocytes Relative: 4 % (ref 3–12)
Neutro Abs: 3.8 10*3/uL (ref 1.7–7.7)
Neutrophils Relative %: 71 % (ref 43–77)
PLATELETS: 206 10*3/uL (ref 150–400)
RBC: 3.99 MIL/uL — ABNORMAL LOW (ref 4.22–5.81)
RDW: 15.5 % (ref 11.5–15.5)
WBC: 5.4 10*3/uL (ref 4.0–10.5)

## 2013-07-17 LAB — TSH: TSH: 1.659 u[IU]/mL (ref 0.350–4.500)

## 2013-07-17 LAB — HEMOGLOBIN A1C
Hgb A1c MFr Bld: 5.4 % (ref <5.7)
MEAN PLASMA GLUCOSE: 108 mg/dL (ref <117)

## 2013-07-17 LAB — BASIC METABOLIC PANEL WITH GFR
BUN: 27 mg/dL — AB (ref 6–23)
CALCIUM: 9 mg/dL (ref 8.4–10.5)
CHLORIDE: 110 meq/L (ref 96–112)
CO2: 25 mEq/L (ref 19–32)
Creat: 1.96 mg/dL — ABNORMAL HIGH (ref 0.50–1.35)
GFR, EST AFRICAN AMERICAN: 38 mL/min — AB
GFR, EST NON AFRICAN AMERICAN: 33 mL/min — AB
Glucose, Bld: 98 mg/dL (ref 70–99)
Potassium: 4.3 mEq/L (ref 3.5–5.3)
Sodium: 142 mEq/L (ref 135–145)

## 2013-07-17 LAB — LIPID PANEL
Cholesterol: 170 mg/dL (ref 0–200)
HDL: 44 mg/dL (ref >39)
LDL Cholesterol: 95 mg/dL (ref 0–99)
TRIGLYCERIDES: 154 mg/dL — AB (ref <150)
Total CHOL/HDL Ratio: 3.9 Ratio
VLDL: 31 mg/dL (ref 0–40)

## 2013-07-17 LAB — HEPATIC FUNCTION PANEL
ALBUMIN: 3.9 g/dL (ref 3.5–5.2)
ALT: 13 U/L (ref 0–53)
AST: 20 U/L (ref 0–37)
Alkaline Phosphatase: 59 U/L (ref 39–117)
BILIRUBIN TOTAL: 0.5 mg/dL (ref 0.3–1.2)
Bilirubin, Direct: 0.1 mg/dL (ref 0.0–0.3)
Indirect Bilirubin: 0.4 mg/dL (ref 0.0–0.9)
TOTAL PROTEIN: 6.2 g/dL (ref 6.0–8.3)

## 2013-07-17 LAB — POC HEMOCCULT BLD/STL (HOME/3-CARD/SCREEN)
Card #2 Fecal Occult Blod, POC: NEGATIVE
FECAL OCCULT BLD: NEGATIVE
FECAL OCCULT BLD: NEGATIVE

## 2013-07-17 LAB — MAGNESIUM: MAGNESIUM: 1.8 mg/dL (ref 1.5–2.5)

## 2013-07-17 NOTE — Progress Notes (Signed)
Patient ID: Terry Garner, male   DOB: 29-Sep-1940, 73 y.o.   MRN: [id number]   This very nice 73 y.o. MWM presents for 3 month follow up with Hypertension, Hyperlipidemia, Pre-Diabetes and Vitamin D Deficiency.    HTN predates since 1998 and Hx/o ASHD and stents x 2 in 2001 & 2008. He had a  Negative cath in 2010.. BP has been controlled at home. Today's blood pressure is 150/ 68 rechecked at 138/72. Patient denies any cardiac type chest pain, palpitations, dyspnea/orthopnea/PND, dizziness, claudication, or dependent edema.   Hyperlipidemia is controlled with diet & meds. Last Cholesterol was 189, Triglycerides were 156, HDL 45 and LDL 113 - above goal. Patient denies myalgias or other med SE's.    Also, the patient has history of PreDiabetes A1c 6.0 % in 12/2011 and with last A1c of 5.6 % in Oct 2014. Patient denies any symptoms of reactive hypoglycemia, diabetic polys, paresthesias or visual blurring.   Further, Patient has history of Vitamin D Deficiency 27 in 2008 with last vitamin D of 60 in Oct 2014. Patient supplements vitamin D without any suspected side-effects.    Medication List         ALLEGRA PO  Take by mouth. Takes daily     allopurinol 300 MG tablet  Commonly known as:  ZYLOPRIM  Take 150 mg by mouth daily.     amLODipine 5 MG tablet  Commonly known as:  NORVASC  Take 1 tablet (5 mg total) by mouth daily.     aspirin EC 81 MG tablet  Take 81 mg by mouth daily.     clopidogrel 75 MG tablet  Commonly known as:  PLAVIX  Take 75 mg by mouth daily.     fenofibrate micronized 134 MG capsule  Commonly known as:  LOFIBRA  Take 134 mg by mouth daily before breakfast.     IRON PO  Take by mouth. Patient takes slow fe daily     latanoprost 0.005 % ophthalmic solution  Commonly known as:  XALATAN     pravastatin 40 MG tablet  Commonly known as:  PRAVACHOL  Take 20 mg by mouth daily. Takes 1/2 daily     VITAMIN B COMPLEX PO  Take by mouth. Takes daily     Vitamin  D3 2000 UNITS Tabs  Take 1 tablet by mouth 2 (two) times daily.         Allergies  Allergen Reactions  . Pravastatin Other (See Comments)    Memory issues, myalgias  . Toprol Xl [Metoprolol Tartrate] Other (See Comments)    Bradycardia with beta blockers  . Coumadin [Warfarin Sodium] Rash  . Tape Rash    Clear tape    PMHx:   Past Medical History  Diagnosis Date  . Hypertension   . Heart murmur   . Chronic kidney disease     CKD, right renal artery stenosis  . AAA (abdominal aortic aneurysm)     3.6 cm (01/09/12 ultrasound)  . Carotid artery disease   . Coronary artery disease   . Arthritis   . Gout   . Vitamin D deficiency     FHx:    Reviewed / unchanged  SHx:    Reviewed / unchanged  Systems Review: Constitutional: Denies fever, chills, wt changes, headaches, insomnia, fatigue, night sweats, change in appetite. Eyes: Denies redness, blurred vision, diplopia, discharge, itchy, watery eyes.  ENT: Denies discharge, congestion, post nasal drip, epistaxis, sore throat, earache, hearing loss, dental pain, tinnitus, vertigo,  sinus pain, snoring.  CV: Denies chest pain, palpitations, irregular heartbeat, syncope, dyspnea, diaphoresis, orthopnea, PND, claudication, edema. Respiratory: denies cough, dyspnea, DOE, pleurisy, hoarseness, laryngitis, wheezing.  Gastrointestinal: Denies dysphagia, odynophagia, heartburn, reflux, water brash, abdominal pain or cramps, nausea, vomiting, bloating, diarrhea, constipation, hematemesis, melena, hematochezia,  or hemorrhoids. Genitourinary: Denies dysuria, frequency, urgency, nocturia, hesitancy, discharge, hematuria, flank pain. Musculoskeletal: Denies arthralgias, myalgias, stiffness, jt. swelling, pain, limp, strain/sprain.  Skin: Denies pruritus, rash, hives, warts, acne, eczema, change in skin lesion(s). Neuro: No weakness, tremor, incoordination, spasms, paresthesia, or pain. Psychiatric: Denies confusion, memory loss, or sensory  loss. Endo: Denies change in weight, skin, hair change.  Heme/Lymph: No excessive bleeding, bruising, orenlarged lymph nodes.  BP: 150/68  Pulse: 52  Temp: 97.9 F (36.6 C)  Resp: 16    Estimated body mass index is 27.75 kg/(m^2) as calculated from the following:   Height as of 06/09/13: 6' 1.75" (1.873 m).   Weight as of this encounter: 214 lb 9.6 oz (97.342 kg).  On Exam: Appears well nourished - in no distress. Eyes: PERRLA, EOMs, conjunctiva no swelling or erythema. Sinuses: No frontal/maxillary tenderness ENT/Mouth: EAC's clear, TM's nl w/o erythema, bulging. Nares clear w/o erythema, swelling, exudates. Oropharynx clear without erythema or exudates. Oral hygiene is good. Tongue normal, non obstructing. Hearing intact.  Neck: Supple. Thyroid nl. Car 2+/2+ without bruits, nodes or JVD. Chest: Respirations nl with BS clear & equal w/o rales, rhonchi, wheezing or stridor.  Cor: Heart sounds normal w/ regular rate and rhythm without sig. murmurs, gallops, clicks, or rubs. Peripheral pulses normal and equal  without edema.  Abdomen: Soft & bowel sounds normal. Non-tender w/o guarding, rebound, hernias, masses, or organomegaly.  Lymphatics: Unremarkable.  Musculoskeletal: Full ROM all peripheral extremities, joint stability, 5/5 strength, and normal gait.  Skin: Warm, dry without exposed rashes, lesions, ecchymosis apparent.  Neuro: Cranial nerves intact, reflexes equal bilaterally. Sensory-motor testing grossly intact. Tendon reflexes grossly intact.  Pysch: Alert & oriented x 3. Insight and judgement nl & appropriate. No ideations.  Assessment and Plan:  1. Hypertension & ASHD/stents - Continue monitor blood pressure at home. Continue diet/meds same pendind lipid labs.  2. Hyperlipidemia - Continue diet/meds, exercise,& lifestyle modifications. Continue monitor periodic cholesterol/liver & renal functions   3. Pre-diabetes/Insulin Resistance - Continue diet, exercise, lifestyle  modifications. Monitor appropriate labs.  4. Vitamin D Deficiency - Continue supplementation.  Recommended regular exercise, BP monitoring, weight control, and discussed med and SE's. Recommended labs to assess and monitor clinical status. Further disposition pending results of labs.

## 2013-07-17 NOTE — Patient Instructions (Signed)

## 2013-07-18 LAB — VITAMIN D 25 HYDROXY (VIT D DEFICIENCY, FRACTURES): VIT D 25 HYDROXY: 57 ng/mL (ref 30–89)

## 2013-07-18 LAB — INSULIN, FASTING: Insulin fasting, serum: 16 u[IU]/mL (ref 3–28)

## 2013-09-01 ENCOUNTER — Ambulatory Visit (INDEPENDENT_AMBULATORY_CARE_PROVIDER_SITE_OTHER): Payer: Medicare Other | Admitting: Internal Medicine

## 2013-09-01 ENCOUNTER — Encounter: Payer: Self-pay | Admitting: Internal Medicine

## 2013-09-01 VITALS — BP 160/76 | HR 60 | Temp 97.9°F | Resp 16 | Ht 73.0 in | Wt 212.4 lb

## 2013-09-01 DIAGNOSIS — Z125 Encounter for screening for malignant neoplasm of prostate: Secondary | ICD-10-CM

## 2013-09-01 DIAGNOSIS — Z789 Other specified health status: Secondary | ICD-10-CM

## 2013-09-01 DIAGNOSIS — E559 Vitamin D deficiency, unspecified: Secondary | ICD-10-CM

## 2013-09-01 DIAGNOSIS — Z79899 Other long term (current) drug therapy: Secondary | ICD-10-CM | POA: Insufficient documentation

## 2013-09-01 DIAGNOSIS — R7309 Other abnormal glucose: Secondary | ICD-10-CM

## 2013-09-01 DIAGNOSIS — M109 Gout, unspecified: Secondary | ICD-10-CM

## 2013-09-01 DIAGNOSIS — Z0000 Encounter for general adult medical examination without abnormal findings: Secondary | ICD-10-CM

## 2013-09-01 DIAGNOSIS — Z1331 Encounter for screening for depression: Secondary | ICD-10-CM

## 2013-09-01 DIAGNOSIS — Z1212 Encounter for screening for malignant neoplasm of rectum: Secondary | ICD-10-CM

## 2013-09-01 DIAGNOSIS — E785 Hyperlipidemia, unspecified: Secondary | ICD-10-CM

## 2013-09-01 DIAGNOSIS — I10 Essential (primary) hypertension: Secondary | ICD-10-CM

## 2013-09-01 LAB — URIC ACID: URIC ACID, SERUM: 5.6 mg/dL (ref 4.0–7.8)

## 2013-09-01 LAB — BASIC METABOLIC PANEL WITH GFR
BUN: 25 mg/dL — ABNORMAL HIGH (ref 6–23)
CALCIUM: 9 mg/dL (ref 8.4–10.5)
CO2: 27 meq/L (ref 19–32)
CREATININE: 2.01 mg/dL — AB (ref 0.50–1.35)
Chloride: 110 mEq/L (ref 96–112)
GFR, EST AFRICAN AMERICAN: 37 mL/min — AB
GFR, Est Non African American: 32 mL/min — ABNORMAL LOW
Glucose, Bld: 84 mg/dL (ref 70–99)
Potassium: 4.4 mEq/L (ref 3.5–5.3)
SODIUM: 145 meq/L (ref 135–145)

## 2013-09-01 LAB — LIPID PANEL
CHOL/HDL RATIO: 3.6 ratio
CHOLESTEROL: 158 mg/dL (ref 0–200)
HDL: 44 mg/dL (ref >39)
LDL Cholesterol: 89 mg/dL (ref 0–99)
Triglycerides: 123 mg/dL (ref <150)
VLDL: 25 mg/dL (ref 0–40)

## 2013-09-01 LAB — CBC WITH DIFFERENTIAL/PLATELET
BASOS ABS: 0.1 10*3/uL (ref 0.0–0.1)
Basophils Relative: 1 % (ref 0–1)
EOS PCT: 5 % (ref 0–5)
Eosinophils Absolute: 0.3 10*3/uL (ref 0.0–0.7)
HCT: 34.2 % — ABNORMAL LOW (ref 39.0–52.0)
Hemoglobin: 11.8 g/dL — ABNORMAL LOW (ref 13.0–17.0)
LYMPHS PCT: 18 % (ref 12–46)
Lymphs Abs: 1 10*3/uL (ref 0.7–4.0)
MCH: 28.9 pg (ref 26.0–34.0)
MCHC: 34.5 g/dL (ref 30.0–36.0)
MCV: 83.6 fL (ref 78.0–100.0)
Monocytes Absolute: 0.3 10*3/uL (ref 0.1–1.0)
Monocytes Relative: 6 % (ref 3–12)
NEUTROS PCT: 70 % (ref 43–77)
Neutro Abs: 3.9 10*3/uL (ref 1.7–7.7)
PLATELETS: 194 10*3/uL (ref 150–400)
RBC: 4.09 MIL/uL — AB (ref 4.22–5.81)
RDW: 15 % (ref 11.5–15.5)
WBC: 5.5 10*3/uL (ref 4.0–10.5)

## 2013-09-01 LAB — HEPATIC FUNCTION PANEL
ALBUMIN: 4.1 g/dL (ref 3.5–5.2)
ALK PHOS: 49 U/L (ref 39–117)
ALT: 13 U/L (ref 0–53)
AST: 19 U/L (ref 0–37)
BILIRUBIN TOTAL: 0.6 mg/dL (ref 0.2–1.2)
Bilirubin, Direct: 0.2 mg/dL (ref 0.0–0.3)
Indirect Bilirubin: 0.4 mg/dL (ref 0.2–1.2)
Total Protein: 6 g/dL (ref 6.0–8.3)

## 2013-09-01 LAB — MAGNESIUM: Magnesium: 1.8 mg/dL (ref 1.5–2.5)

## 2013-09-01 LAB — TSH: TSH: 2.019 u[IU]/mL (ref 0.350–4.500)

## 2013-09-01 LAB — HEMOGLOBIN A1C
HEMOGLOBIN A1C: 5.4 % (ref <5.7)
Mean Plasma Glucose: 108 mg/dL (ref <117)

## 2013-09-01 MED ORDER — LISINOPRIL-HYDROCHLOROTHIAZIDE 20-25 MG PO TABS: 1.0000 | ORAL_TABLET | Freq: Every day | ORAL | Status: DC

## 2013-09-01 NOTE — Progress Notes (Signed)
Patient ID: Terry Garner, male   DOB: [birth year], 73 y.o.   MRN: [id number]   Annual Screening Comprehensive Examination  This very nice 73 y.o. MWM presents for complete physical.  Patient has been followed for HTN, ASCAD,  Glaucoma,  Prediabetes, Hyperlipidemia, and Vitamin D Deficiency.   HTN predates since 1998. Patient's BP has been controlled at home.Today's BP: 160/76 mmHg  And rechecked at 180/76. He did have PTCA/stents  X 2 in 2001 & 2008 and heart cath in Dec 2010 showed stents patent. Patient denies any cardiac symptoms as chest pain, palpitations, shortness of breath, dizziness or ankle swelling.   Patient's hyperlipidemia is controlled with diet and medications. Patient denies myalgias or other medication SE's. Last cholesterol last visit was 189, triglycerides 156, HDL 45 and LDL   113 in Oct 2014 - not at goal..    Patient has prediabetes with last A1c 5.6% in Oct 2014. Patient denies reactive hypoglycemic symptoms, visual blurring, diabetic polys, or paresthesias.    Finally, patient has history of Vitamin D Deficiency of 27 in 2008 with last vitamin D 60 in Oct 2014.   Medication List       ALLEGRA PO  Take by mouth. Takes daily     allopurinol 300 MG tablet  Commonly known as:  ZYLOPRIM  Take 150 mg by mouth daily.     amLODipine 5 MG tablet  Commonly known as:  NORVASC  Take 1 tablet (5 mg total) by mouth daily.     aspirin EC 81 MG tablet  Take 81 mg by mouth daily.     clopidogrel 75 MG tablet  Commonly known as:  PLAVIX  Take 75 mg by mouth daily.     fenofibrate micronized 134 MG capsule  Commonly known as:  LOFIBRA  Take 134 mg by mouth daily before breakfast.     latanoprost 0.005 % ophthalmic solution  Commonly known as:  XALATAN     lisinopril-hydrochlorothiazide 20-25 MG per tablet  Commonly known as:  PRINZIDE,ZESTORETIC  Take 1 tablet by mouth daily.     pravastatin 40 MG tablet  Commonly known as:  PRAVACHOL  Take 20 mg by mouth daily.  Takes 1/2 daily     Vitamin D3 2000 UNITS Tabs  Take 1 tablet by mouth 2 (two) times daily.       Allergies  Allergen Reactions  . Pravastatin Other (See Comments)    Memory issues, myalgias  . Toprol Xl [Metoprolol Tartrate] Other (See Comments)    Bradycardia with beta blockers  . Coumadin [Warfarin Sodium] Rash  . Tape Rash    Clear tape    Past Medical History  Diagnosis Date  . Hypertension   . Heart murmur   . Chronic kidney disease     CKD, right renal artery stenosis  . AAA (abdominal aortic aneurysm)     3.6 cm (01/09/12 ultrasound)  . Carotid artery disease   . Coronary artery disease   . Arthritis   . Gout   . Vitamin D deficiency     Past Surgical History  Procedure Laterality Date  . Cardiac catheterization      stents  last -07  . Hernia repair  26    rt  . Joint replacement      lft hip  . Shoulder arthroscopy      rt  . Cervical disc surgery    . Lumbar laminectomy  06/06/2012  . Cardiac stents    . Lumbar laminectomy  06/06/2012    Procedure: MICRODISCECTOMY LUMBAR LAMINECTOMY;  Surgeon: Marybelle Killings, MD;  Location: [facility];  Service: Orthopedics;  Laterality: Left;  Left L4-5 Microdiscectomy  . Back surgery    . Eye surgery  06    retinal detachment   Family History - positive  For HTN, CVA, Gout and High Cholesterol in parents and sibs.  History   Social History  . Marital Status: Married    Spouse Name: N/A    Number of Children: N/A  . Years of Education: N/A   Occupational History  . Retired Armed forces training and education officer.   Social History Main Topics  . Smoking status: Never Smoker   . Smokeless tobacco: Never Used  . Alcohol Use: No  . Drug Use: No  . Sexual Activity: Not on file    ROS Constitutional: Denies fever, chills, weight loss/gain, headaches, insomnia, fatigue, night sweats, and change in appetite. Eyes: Denies redness, blurred vision, diplopia, discharge, itchy, watery eyes.  ENT: Denies discharge, congestion, post  nasal drip, epistaxis, sore throat, earache, hearing loss, dental pain, Tinnitus, Vertigo, Sinus pain, snoring.  Cardio: Denies chest pain, palpitations, irregular heartbeat, syncope, dyspnea, diaphoresis, orthopnea, PND, claudication, edema Respiratory: denies cough, dyspnea, DOE, pleurisy, hoarseness, laryngitis, wheezing.  Gastrointestinal: Denies dysphagia, heartburn, reflux, water brash, pain, cramps, nausea, vomiting, bloating, diarrhea, constipation, hematemesis, melena, hematochezia, jaundice, hemorrhoids Genitourinary: Denies dysuria, frequency, urgency, nocturia, hesitancy, discharge, hematuria, flank pain Musculoskeletal: Denies arthralgia, myalgia, stiffness, Jt. Swelling, pain, limp, and strain/sprain. Skin: Denies puritis, rash, hives, warts, acne, eczema, changing in skin lesion Neuro: No weakness, tremor, incoordination, spasms, paresthesia, pain Psychiatric: Denies confusion, memory loss, sensory loss Endocrine: Denies change in weight, skin, hair change, nocturia, and paresthesia, diabetic polys, visual blurring, hyper / hypo glycemic episodes.  Heme/Lymph: No excessive bleeding, bruising, or elarged lymph nodes.  BP: 160/76  Pulse: 60  Temp: 97.9 F (36.6 C)  Resp: 16    Estimated body mass index is 28.03 kg/(m^2) as calculated from the following:   Height as of this encounter: 6\' 1"  (1.854 m).   Weight as of this encounter: 212 lb 6.4 oz (96.344 kg).  Physical Exam General Appearance: Well nourished, in no apparent distress. Eyes: PERRLA, EOMs, conjunctiva no swelling or erythema, normal fundi and vessels. Sinuses: No frontal/maxillary tenderness ENT/Mouth: EACs patent / TMs  nl. Nares clear without erythema, swelling, mucoid exudates. Oral hygiene is good. No erythema, swelling, or exudate. Tongue normal, non-obstructing. Tonsils not swollen or erythematous. Hearing normal.  Neck: Supple, thyroid normal. No bruits, nodes or JVD. Respiratory: Respiratory effort  normal.  BS equal and clear bilateral without rales, rhonci, wheezing or stridor. Cardio: Heart sounds are normal with regular rate and rhythm and no murmurs, rubs or gallops. Peripheral pulses are normal and equal bilaterally without edema. No aortic or femoral bruits. Chest: symmetric with normal excursions and percussion.  Abdomen: Flat, soft, with bowl sounds. Nontender, no guarding, rebound, hernias, masses, or organomegaly.  Lymphatics: Non tender without lymphadenopathy.  Genitourinary: No hernias.Testes nl. DRE - prostate nl for age - smooth & firm w/o nodules. Musculoskeletal: Full ROM all peripheral extremities, joint stability, 5/5 strength, and normal gait. Skin: Warm and dry without rashes, lesions, cyanosis, clubbing or  ecchymosis.  Neuro: Cranial nerves intact, reflexes equal bilaterally. Normal muscle tone, no cerebellar symptoms. Sensation intact.  Pysch: Awake and oriented X 3, normal affect, insight and judgment appropriate.   Assessment and Plan  1. Annual Screening Examination 2. Hypertension  3. Hyperlipidemia 4. Pre  Diabetes 5. Vitamin D Deficiency 6. ASCAD 7. Gout 8. Glaucoma  Continue prudent diet as discussed, weight control, BP monitoring, regular exercise, and medications as discussed.  Discussed med effects and SE's. Routine screening labs and tests as requested with regular follow-up as recommended.

## 2013-09-01 NOTE — Patient Instructions (Signed)

## 2013-09-02 LAB — PSA: PSA: 2.59 ng/mL (ref <=4.00)

## 2013-09-02 LAB — URINALYSIS, MICROSCOPIC ONLY
Bacteria, UA: NONE SEEN
CASTS: NONE SEEN
Crystals: NONE SEEN
SQUAMOUS EPITHELIAL / LPF: NONE SEEN

## 2013-09-02 LAB — INSULIN, FASTING: Insulin fasting, serum: 7 u[IU]/mL (ref 3–28)

## 2013-09-02 LAB — MICROALBUMIN / CREATININE URINE RATIO
Creatinine, Urine: 170.9 mg/dL
Microalb Creat Ratio: 65.9 mg/g — ABNORMAL HIGH (ref 0.0–30.0)
Microalb, Ur: 11.26 mg/dL — ABNORMAL HIGH (ref 0.00–1.89)

## 2013-09-02 LAB — VITAMIN D 25 HYDROXY (VIT D DEFICIENCY, FRACTURES): Vit D, 25-Hydroxy: 81 ng/mL (ref 30–89)

## 2013-09-07 ENCOUNTER — Telehealth: Payer: Self-pay | Admitting: *Deleted

## 2013-09-07 ENCOUNTER — Other Ambulatory Visit: Payer: Self-pay | Admitting: Internal Medicine

## 2013-09-07 MED ORDER — LOSARTAN POTASSIUM-HCTZ 100-25 MG PO TABS: 1.0000 | ORAL_TABLET | Freq: Every day | ORAL | Status: DC

## 2013-09-07 NOTE — Telephone Encounter (Signed)
Spoke with spouse She states patient has concern about taking new RX Linsinopril/HCTZ due to warning about taking if you have glaucoma and med also caused him to have diarrhea.  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  New RX for Losartin/HCTZ 100/25 mg called to [facility] per Dr Melford Aase  .  Spouse informed to start with 1/2 tab and monitor BP.

## 2013-09-09 ENCOUNTER — Other Ambulatory Visit (INDEPENDENT_AMBULATORY_CARE_PROVIDER_SITE_OTHER): Payer: Medicare Other

## 2013-09-09 DIAGNOSIS — Z1212 Encounter for screening for malignant neoplasm of rectum: Secondary | ICD-10-CM

## 2013-09-09 LAB — POC HEMOCCULT BLD/STL (HOME/3-CARD/SCREEN)
Card #2 Fecal Occult Blod, POC: NEGATIVE
Card #3 Fecal Occult Blood, POC: NEGATIVE
Fecal Occult Blood, POC: NEGATIVE

## 2013-09-15 ENCOUNTER — Ambulatory Visit (INDEPENDENT_AMBULATORY_CARE_PROVIDER_SITE_OTHER): Payer: Medicare Other | Admitting: Internal Medicine

## 2013-09-15 ENCOUNTER — Encounter: Payer: Self-pay | Admitting: Internal Medicine

## 2013-09-15 VITALS — BP 142/70 | HR 48 | Temp 97.9°F | Resp 16 | Ht 73.0 in | Wt 213.0 lb

## 2013-09-15 DIAGNOSIS — D485 Neoplasm of uncertain behavior of skin: Secondary | ICD-10-CM

## 2013-09-15 DIAGNOSIS — I10 Essential (primary) hypertension: Secondary | ICD-10-CM

## 2013-09-15 NOTE — Patient Instructions (Signed)
  Monitor BP's and if BP greater than 150/90   then   Increase Losartin 100/25 to 1 whole tablet daily     Hypertension Hypertension is another name for high blood pressure. High blood pressure may mean that your heart needs to work harder to pump blood. Blood pressure consists of two numbers, which includes a higher number over a lower number (example: 110/72). HOME CARE   Make lifestyle changes as told by your doctor. This may include weight loss and exercise.  Take your blood pressure medicine every day.  Limit how much salt you use.  Stop smoking if you smoke.  Do not use drugs.  Talk to your doctor if you are using decongestants or birth control pills. These medicines might make blood pressure higher.  Females should not drink more than 1 alcoholic drink per day. Males should not drink more than 2 alcoholic drinks per day.  See your doctor as told. GET HELP RIGHT AWAY IF:   You have a blood pressure reading with a top number of 180 or higher.  You get a very bad headache.  You get blurred or changing vision.  You feel confused.  You feel weak, numb, or faint.  You get chest or belly (abdominal) pain.  You throw up (vomit).  You cannot breathe very well. MAKE SURE YOU:   Understand these instructions.  Will watch your condition.  Will get help right away if you are not doing well or get worse. Document Released: 11/28/2007 Document Revised: 09/03/2011 Document Reviewed: 11/28/2007 [facility] Patient Information 2014 [place], [place].

## 2013-09-15 NOTE — Progress Notes (Signed)
Subjective:    Patient ID: Terry Garner, male    DOB: 01/04/1941, 73 y.o.   MRN: [id number]  HPI pPatient is a very nice 73 yo MWM being seen in F/U of elevated BP and had been started on lisinopril/HCT, but developed diarrhea which resolved with switching to Losartrin/HCT. BP today is still elevated today altho he reports home BP's have ranged 120-140/60-80 range. He denies any hypertensive type Sx's. Recent GFR calculated at 34 consistent with Stage IV CKD. Medication Sig  . allopurinol (ZYLOPRIM) 300 MG tablet Take 150 mg by mouth daily.  [name] amLODipine (NORVASC) 5 MG tablet Take 1 tablet (5 mg total) by mouth daily.  [name] aspirin EC 81 MG tablet Take 81 mg by mouth daily.  . Cholecalciferol (VITAMIN D3) 2000 UNITS TABS Take 1 tablet by mouth 2 (two) times daily.  . clopidogrel (PLAVIX) 75 MG tablet Take 75 mg by mouth daily.  . fenofibrate micronized (LOFIBRA) 134 MG capsule Take 134 mg by mouth daily before breakfast.  . Fexofenadine HCl (ALLEGRA PO) Take by mouth. Takes daily  . latanoprost (XALATAN) 0.005 % ophthalmic solution   . losartan-hydrochlorothiazide (HYZAAR) 100-25 MG per tablet Take 1 tablet by mouth daily. For BP  . pravastatin (PRAVACHOL) 40 MG tablet Take 20 mg by mouth daily. Takes 1/2 daily    Allergies  Allergen Reactions  . Pravastatin Other (See Comments)    Memory issues, myalgias  . Toprol Xl [Metoprolol Tartrate] Other (See Comments)    Bradycardia with beta blockers  . Coumadin [Warfarin Sodium] Rash  . Tape Rash    Clear tape   Past Medical History  Diagnosis Date  . Hypertension   . Heart murmur   . Chronic kidney disease     CKD, right renal artery stenosis  . AAA (abdominal aortic aneurysm)     3.6 cm (01/09/12 ultrasound)  . Carotid artery disease   . Coronary artery disease   . Arthritis   . Gout   . Vitamin D deficiency    Review of Systems Negative except as above    Objective:   Physical Exam       BP 142/70  Pulse 48  Temp 97.9  F   Resp 16  Ht 6\' 1"    Wt [id number] lb  BMI 28.11 kg/m2  HEENT - Eac's patent. TM's Nl.EOM's full. PERRLA. NasoOroPharynx clear. Neck - supple. Nl Thyroid. No bruits nodes JVD Chest - Clear equal BS Cor - Nl HS. RRR w/o sig MGR. PP 1(+) No edema. Abd - No palpable organomegaly, masses or tenderness. BS nl. MS- FROM. w/o deformities. Muscle power tone and bulk Nl. Gait Nl. Neuro - No obvious Cr N abnormalities. Sensory, motor and Cerebellar functions appear Nl w/o focal abnormalities.  Assessment & Plan:   1. Unspecified essential hypertension - advised to increase Losartin/HCT from 1/2 to 1 whole tab daily and continue to monitor BP's and call if remains elevated.  2. 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  Procedure Details  With informed consent the areas were treated with liquid nitrogen by double freeze/thaw technique.  Diagnosis: Skin Lesions , Ukn Significance -  suspect Actinic keratoses  Procedure code: [id number]  Condition: Stable  Complications:  None  Plan: 1. Instructed in wound care 2. Warning signs of infection were reviewed & pt advised to call if any questions/problems.    3. Recommended that the patient use tynenol/ibuprofen as needed for pain.

## 2013-10-06 ENCOUNTER — Other Ambulatory Visit: Payer: Self-pay

## 2013-10-06 MED ORDER — ALLOPURINOL 300 MG PO TABS: 300.0000 mg | ORAL_TABLET | Freq: Every day | ORAL | Status: DC

## 2013-10-21 ENCOUNTER — Encounter: Payer: Self-pay | Admitting: Emergency Medicine

## 2013-10-21 ENCOUNTER — Ambulatory Visit (INDEPENDENT_AMBULATORY_CARE_PROVIDER_SITE_OTHER): Payer: Medicare Other | Admitting: Emergency Medicine

## 2013-10-21 VITALS — BP 124/62 | HR 54 | Temp 98.0°F | Resp 16 | Ht 73.75 in | Wt 208.0 lb

## 2013-10-21 DIAGNOSIS — R197 Diarrhea, unspecified: Secondary | ICD-10-CM

## 2013-10-21 DIAGNOSIS — R634 Abnormal weight loss: Secondary | ICD-10-CM

## 2013-10-21 DIAGNOSIS — R112 Nausea with vomiting, unspecified: Secondary | ICD-10-CM

## 2013-10-21 LAB — LIPASE: Lipase: 28 U/L (ref 0–75)

## 2013-10-21 LAB — HEPATIC FUNCTION PANEL
ALK PHOS: 41 U/L (ref 39–117)
ALT: 13 U/L (ref 0–53)
AST: 19 U/L (ref 0–37)
Albumin: 4.2 g/dL (ref 3.5–5.2)
BILIRUBIN INDIRECT: 0.5 mg/dL (ref 0.2–1.2)
BILIRUBIN TOTAL: 0.6 mg/dL (ref 0.2–1.2)
Bilirubin, Direct: 0.1 mg/dL (ref 0.0–0.3)
Total Protein: 6.2 g/dL (ref 6.0–8.3)

## 2013-10-21 LAB — BASIC METABOLIC PANEL WITH GFR
BUN: 42 mg/dL — AB (ref 6–23)
CALCIUM: 9.6 mg/dL (ref 8.4–10.5)
CO2: 24 meq/L (ref 19–32)
CREATININE: 2.77 mg/dL — AB (ref 0.50–1.35)
Chloride: 108 mEq/L (ref 96–112)
GFR, Est African American: 25 mL/min — ABNORMAL LOW
GFR, Est Non African American: 22 mL/min — ABNORMAL LOW
GLUCOSE: 84 mg/dL (ref 70–99)
Potassium: 5.4 mEq/L — ABNORMAL HIGH (ref 3.5–5.3)
Sodium: 141 mEq/L (ref 135–145)

## 2013-10-21 LAB — CBC WITH DIFFERENTIAL/PLATELET
BASOS PCT: 1 % (ref 0–1)
Basophils Absolute: 0.1 10*3/uL (ref 0.0–0.1)
Eosinophils Absolute: 0.5 10*3/uL (ref 0.0–0.7)
Eosinophils Relative: 8 % — ABNORMAL HIGH (ref 0–5)
HEMATOCRIT: 32.8 % — AB (ref 39.0–52.0)
Hemoglobin: 11.2 g/dL — ABNORMAL LOW (ref 13.0–17.0)
LYMPHS PCT: 20 % (ref 12–46)
Lymphs Abs: 1.2 10*3/uL (ref 0.7–4.0)
MCH: 28.8 pg (ref 26.0–34.0)
MCHC: 34.1 g/dL (ref 30.0–36.0)
MCV: 84.3 fL (ref 78.0–100.0)
MONO ABS: 0.4 10*3/uL (ref 0.1–1.0)
Monocytes Relative: 6 % (ref 3–12)
Neutro Abs: 3.9 10*3/uL (ref 1.7–7.7)
Neutrophils Relative %: 65 % (ref 43–77)
Platelets: 194 10*3/uL (ref 150–400)
RBC: 3.89 MIL/uL — ABNORMAL LOW (ref 4.22–5.81)
RDW: 15.2 % (ref 11.5–15.5)
WBC: 6 10*3/uL (ref 4.0–10.5)

## 2013-10-21 LAB — AMYLASE: Amylase: 65 U/L (ref 0–105)

## 2013-10-21 LAB — TSH: TSH: 1.477 u[IU]/mL (ref 0.350–4.500)

## 2013-10-21 NOTE — Progress Notes (Signed)
Subjective:    Patient ID: Terry Garner, male    DOB: 1940-12-15, 73 y.o.   MRN: [id number]  HPI Comments: 73 yo male with diarrhea over the last year. He notes he has increased symptoms over the last 3-4 months. He has BM 5-6 times in the a.m. and p.m. He occasionally has bowel incontinence. He notes blood on tissue occasionally but not in stools. HE took Loperamide Hcl helped some. He notes soft stool not watery. He also has increased belching. Colonoscopy 2008 WNL He notes chronic nausea with RUQ tenderness with lying on right side. He has been loosing weight with out trying.  BP120-40/60s  Diarrhea  Associated symptoms include abdominal pain.     Medication List       This list is accurate as of: 10/21/13 12:05 PM.  Always use your most recent med list.               ALLEGRA PO  Take by mouth. Takes daily     allopurinol 300 MG tablet  Commonly known as:  ZYLOPRIM  Take 1 tablet (300 mg total) by mouth daily.     amLODipine 5 MG tablet  Commonly known as:  NORVASC  Take 1 tablet (5 mg total) by mouth daily.     aspirin EC 81 MG tablet  Take 81 mg by mouth daily.     clopidogrel 75 MG tablet  Commonly known as:  PLAVIX  Take 75 mg by mouth daily.     fenofibrate micronized 134 MG capsule  Commonly known as:  LOFIBRA  Take 134 mg by mouth daily before breakfast.     latanoprost 0.005 % ophthalmic solution  Commonly known as:  XALATAN  Place 1 drop into both eyes daily.     losartan-hydrochlorothiazide 100-25 MG per tablet  Commonly known as:  HYZAAR  Take 1 tablet by mouth daily. For BP     pravastatin 40 MG tablet  Commonly known as:  PRAVACHOL  Take 20 mg by mouth daily. Takes 1/2 daily     Vitamin D3 2000 UNITS Tabs  Take 1 tablet by mouth 2 (two) times daily.       Allergies  Allergen Reactions  . Pravastatin Other (See Comments)    Memory issues, myalgias  . Toprol Xl [Metoprolol Tartrate] Other (See Comments)    Bradycardia with beta  blockers  . Coumadin [Warfarin Sodium] Rash  . Tape Rash    Clear tape   Past Medical History  Diagnosis Date  . Hypertension   . Heart murmur   . Chronic kidney disease     CKD, right renal artery stenosis  . AAA (abdominal aortic aneurysm)     3.6 cm (01/09/12 ultrasound)  . Carotid artery disease   . Coronary artery disease   . Arthritis   . Gout   . Vitamin D deficiency       Review of Systems  Constitutional: Positive for unexpected weight change.  Gastrointestinal: Positive for abdominal pain and diarrhea.   BP 124/62  Pulse 54  Temp(Src) 98 F (36.7 C) (Temporal)  Resp 16  Ht 6' 1.75" (1.873 m)  Wt 208 lb (94.348 kg)  BMI 26.89 kg/m2     Objective:   Physical Exam  Nursing note and vitals reviewed. Constitutional: He is oriented to person, place, and time. He appears well-developed and well-nourished.  HENT:  Head: Normocephalic and atraumatic.  Right Ear: External ear normal.  Left Ear: External ear normal.  Nose: Nose normal.  Eyes: Conjunctivae and EOM are normal.  Neck: Normal range of motion. Neck supple. No JVD present. No thyromegaly present.  Cardiovascular: Normal rate, regular rhythm, normal heart sounds and intact distal pulses.   Pulmonary/Chest: Effort normal and breath sounds normal.  Abdominal: Soft. Bowel sounds are normal. He exhibits no distension and no mass. There is tenderness. There is no rebound and no guarding.  Minimal RUQ  Musculoskeletal: Normal range of motion. He exhibits no edema and no tenderness.  Lymphadenopathy:    He has no cervical adenopathy.  Neurological: He is alert and oriented to person, place, and time. He has normal reflexes. No cranial nerve deficit. Coordination normal.  Skin: Skin is warm and dry.  Psychiatric: He has a normal mood and affect. His behavior is normal. Judgment and thought content normal.          Assessment & Plan:  diarrhea/ nausea/ weight loss-CT ABd/ Pelvis- If all labs/ Scans are  negative and no change with diarrhea/ nausea/ weight loss with stopping pravastatin and trying Crestor will need GI Referral. May also try to switch Allopurinol to Uloric with possible SE of diarrhea.

## 2013-10-21 NOTE — Patient Instructions (Signed)
If all labs/ Scans are negative and no change with diarrhea/ nausea/ weight loss with stopping pravastatin and trying Crestor will nee Gastroenterologist Referral. Please call office.   Dehydration, Adult Dehydration means your body does not have as much fluid as it needs. Your kidneys, brain, and heart will not work properly without the right amount of fluids and salt.  HOME CARE  Ask your doctor how to replace body fluid losses (rehydrate).  Drink enough fluids to keep your pee (urine) clear or pale yellow.  Drink small amounts of fluids often if you feel sick to your stomach (nauseous) or throw up (vomit).  Eat like you normally do.  Avoid:  Foods or drinks high in sugar.  Bubbly (carbonated) drinks.  Juice.  Very hot or cold fluids.  Drinks with caffeine.  Fatty, greasy foods.  Alcohol.  Tobacco.  Eating too much.  Gelatin desserts.  Wash your hands to avoid spreading germs (bacteria, viruses).  Only take medicine as told by your doctor.  Keep all doctor visits as told. GET HELP RIGHT AWAY IF:   You cannot drink something without throwing up.  You get worse even with treatment.  Your vomit has blood in it or looks greenish.  Your poop (stool) has blood in it or looks black and tarry.  You have not peed in 6 to 8 hours.  You pee a small amount of very dark pee.  You have a fever.  You pass out (faint).  You have belly (abdominal) pain that gets worse or stays in one spot (localizes).  You have a rash, stiff neck, or bad headache.  You get easily annoyed, sleepy, or are hard to wake up.  You feel weak, dizzy, or very thirsty. MAKE SURE YOU:   Understand these instructions.  Will watch your condition.  Will get help right away if you are not doing well or get worse. Document Released: 04/07/2009 Document Revised: 09/03/2011 Document Reviewed: 01/29/2011 [facility] Patient Information 2014 [place], [place]. Diarrhea Diarrhea is watery poop  (stool). It can make you feel weak, tired, thirsty, or give you a dry mouth (signs of dehydration). Watery poop is a sign of another problem, most often an infection. It often lasts 2 3 days. It can last longer if it is a sign of something serious. Take care of yourself as told by your doctor. HOME CARE   Drink 1 cup (8 ounces) of fluid each time you have watery poop.  Do not drink the following fluids:  Those that contain simple sugars (fructose, glucose, galactose, lactose, sucrose, maltose).  Sports drinks.  Fruit juices.  Whole milk products.  Sodas.  Drinks with caffeine (coffee, tea, soda) or alcohol.  Oral rehydration solution may be used if the doctor says it is okay. You may make your own solution. Follow this recipe:    teaspoon table salt.   teaspoon baking soda.   teaspoon salt substitute containing potassium chloride.  1 tablespoons sugar.  1 liter (34 ounces) of water.  Avoid the following foods:  High fiber foods, such as raw fruits and vegetables.  Nuts, seeds, and whole grain breads and cereals.   Those that are sweetened with sugar alcohols (xylitol, sorbitol, mannitol).  Try eating the following foods:  Starchy foods, such as rice, toast, pasta, low-sugar cereal, oatmeal, baked potatoes, crackers, and bagels.  Bananas.  Applesauce.  Eat probiotic-rich foods, such as yogurt and milk products that are fermented.  Wash your hands well after each time you have watery  poop.  Only take medicine as told by your doctor.  Take a warm bath to help lessen burning or pain from having watery poop. GET HELP RIGHT AWAY IF:   You cannot drink fluids without throwing up (vomiting).  You keep throwing up.  You have blood in your poop, or your poop looks black and tarry.  You do not pee (urinate) in 6 8 hours, or there is only a small amount of very dark pee.  You have belly (abdominal) pain that gets worse or stays in the same spot (localizes).  You  are weak, dizzy, confused, or lightheaded.  You have a very bad headache.  Your watery poop gets worse or does not get better.  You have a fever or lasting symptoms for more than 2 3 days.  You have a fever and your symptoms suddenly get worse. MAKE SURE YOU:   Understand these instructions.  Will watch your condition.  Will get help right away if you are not doing well or get worse. Document Released: 11/28/2007 Document Revised: 03/05/2012 Document Reviewed: 02/17/2012 [facility] Patient Information 2014 [place], [place].

## 2013-10-22 ENCOUNTER — Other Ambulatory Visit: Payer: Self-pay | Admitting: Emergency Medicine

## 2013-10-22 ENCOUNTER — Other Ambulatory Visit: Payer: Self-pay

## 2013-10-22 DIAGNOSIS — R112 Nausea with vomiting, unspecified: Secondary | ICD-10-CM

## 2013-10-22 DIAGNOSIS — R197 Diarrhea, unspecified: Secondary | ICD-10-CM

## 2013-10-22 DIAGNOSIS — R634 Abnormal weight loss: Secondary | ICD-10-CM

## 2013-10-22 MED ORDER — ROSUVASTATIN CALCIUM 5 MG PO TABS: 5.0000 mg | ORAL_TABLET | Freq: Every day | ORAL | Status: DC

## 2013-10-22 MED ORDER — LOSARTAN POTASSIUM 100 MG PO TABS: 100.0000 mg | ORAL_TABLET | Freq: Every day | ORAL | Status: DC

## 2013-10-23 ENCOUNTER — Ambulatory Visit
Admission: RE | Admit: 2013-10-23 | Discharge: 2013-10-23 | Disposition: A | Discharge status: Home / Self Care | Payer: Medicare Other | Source: Ambulatory Visit | Attending: Emergency Medicine | Admitting: Emergency Medicine

## 2013-10-23 DIAGNOSIS — R197 Diarrhea, unspecified: Secondary | ICD-10-CM

## 2013-10-23 DIAGNOSIS — R634 Abnormal weight loss: Secondary | ICD-10-CM

## 2013-10-23 DIAGNOSIS — R112 Nausea with vomiting, unspecified: Secondary | ICD-10-CM

## 2013-10-26 ENCOUNTER — Ambulatory Visit (INDEPENDENT_AMBULATORY_CARE_PROVIDER_SITE_OTHER): Payer: Medicare Other | Admitting: *Deleted

## 2013-10-26 VITALS — BP 138/56 | HR 52 | Resp 16

## 2013-10-26 DIAGNOSIS — Z79899 Other long term (current) drug therapy: Secondary | ICD-10-CM

## 2013-10-26 DIAGNOSIS — I10 Essential (primary) hypertension: Secondary | ICD-10-CM

## 2013-10-26 NOTE — Progress Notes (Signed)
Patient ID: Terry Garner, male   DOB: 12/27/1940, 73 y.o.   MRN: [id number] Patient presents for Recheck BP and BMP per Kelby Aline, PA-C orders.  Patient started losartan Rx as directed.

## 2013-10-27 LAB — BASIC METABOLIC PANEL WITH GFR
BUN: 41 mg/dL — AB (ref 6–23)
CO2: 24 mEq/L (ref 19–32)
Calcium: 9.6 mg/dL (ref 8.4–10.5)
Chloride: 110 mEq/L (ref 96–112)
Creat: 2.62 mg/dL — ABNORMAL HIGH (ref 0.50–1.35)
GFR, EST AFRICAN AMERICAN: 27 mL/min — AB
GFR, Est Non African American: 23 mL/min — ABNORMAL LOW
GLUCOSE: 93 mg/dL (ref 70–99)
POTASSIUM: 5.7 meq/L — AB (ref 3.5–5.3)
SODIUM: 141 meq/L (ref 135–145)

## 2013-10-30 ENCOUNTER — Telehealth: Payer: Self-pay | Admitting: Cardiovascular Disease

## 2013-10-30 ENCOUNTER — Ambulatory Visit (INDEPENDENT_AMBULATORY_CARE_PROVIDER_SITE_OTHER): Payer: Medicare Other | Admitting: Ophthalmology

## 2013-10-30 DIAGNOSIS — H35039 Hypertensive retinopathy, unspecified eye: Secondary | ICD-10-CM

## 2013-10-30 DIAGNOSIS — I10 Essential (primary) hypertension: Secondary | ICD-10-CM

## 2013-10-30 DIAGNOSIS — H2510 Age-related nuclear cataract, unspecified eye: Secondary | ICD-10-CM

## 2013-10-30 DIAGNOSIS — H43819 Vitreous degeneration, unspecified eye: Secondary | ICD-10-CM

## 2013-10-30 DIAGNOSIS — H35379 Puckering of macula, unspecified eye: Secondary | ICD-10-CM

## 2013-10-30 DIAGNOSIS — H348192 Central retinal vein occlusion, unspecified eye, stable: Secondary | ICD-10-CM

## 2013-11-09 ENCOUNTER — Telehealth: Payer: Self-pay | Admitting: *Deleted

## 2013-11-09 DIAGNOSIS — I714 Abdominal aortic aneurysm, without rupture, unspecified: Secondary | ICD-10-CM

## 2013-11-09 NOTE — Telephone Encounter (Signed)
Patient's wife aware of the need for the doppler and she is agreeable to proceed.  Order placed for aorta doppler

## 2013-11-09 NOTE — Telephone Encounter (Signed)
Message copied by Chauncy Lean on Mon Nov 09, 2013  2:02 PM ------      Message from: [name]      Created: Sun Nov 08, 2013  3:38 PM      Regarding: AAA       I last saw pt 7/14. Recent CT showed a small AAA. Get an abd [place] to measure AAA prior to his annual ROV.            JJB ------

## 2013-11-10 ENCOUNTER — Telehealth (HOSPITAL_COMMUNITY): Payer: Self-pay | Admitting: *Deleted

## 2013-11-11 ENCOUNTER — Other Ambulatory Visit: Payer: Self-pay | Admitting: Emergency Medicine

## 2013-11-11 ENCOUNTER — Telehealth: Payer: Self-pay | Admitting: *Deleted

## 2013-11-11 DIAGNOSIS — R197 Diarrhea, unspecified: Secondary | ICD-10-CM

## 2013-11-11 MED ORDER — HYOSCYAMINE SULFATE 0.125 MG PO TABS: 0.1250 mg | ORAL_TABLET | ORAL | Status: DC | PRN

## 2013-11-11 NOTE — Telephone Encounter (Signed)
PTs wife called says pt is no better with diarrhea says hes already been 6-7 times today *please advise

## 2013-11-11 NOTE — Telephone Encounter (Signed)
Please advise patient RX sent in to slow down diarrhea nad referral sent for GI appointment.

## 2013-11-12 ENCOUNTER — Encounter: Payer: Self-pay | Admitting: Gastroenterology

## 2013-11-12 NOTE — Telephone Encounter (Signed)
Patient's wife aware that Rx was sent into pharmacy.

## 2013-11-12 NOTE — Telephone Encounter (Signed)
Left message on machine at 9:05am.

## 2013-11-12 NOTE — Telephone Encounter (Signed)
Closed encounter °

## 2013-11-13 ENCOUNTER — Ambulatory Visit (HOSPITAL_COMMUNITY)
Admission: RE | Admit: 2013-11-13 | Discharge: 2013-11-13 | Disposition: A | Discharge status: Home / Self Care | Payer: Medicare Other | Source: Ambulatory Visit | Attending: Cardiovascular Disease | Admitting: Cardiovascular Disease

## 2013-11-13 DIAGNOSIS — I714 Abdominal aortic aneurysm, without rupture, unspecified: Secondary | ICD-10-CM

## 2013-11-13 NOTE — Progress Notes (Signed)
Abdominal Aortic Duplex Completed °Brianna L Mazza,RVT °

## 2013-11-23 ENCOUNTER — Telehealth: Payer: Self-pay | Admitting: *Deleted

## 2013-11-23 NOTE — Telephone Encounter (Signed)
Spoke to wife. Result given . Verbalized understanding check in 12 months

## 2013-11-23 NOTE — Telephone Encounter (Signed)
Message copied by Raiford Simmonds on Mon Nov 23, 2013  1:30 PM ------      Message from: [name]      Created: Sat Nov 21, 2013 12:45 PM       Small AAA. Repeat 12 months ------

## 2013-11-24 ENCOUNTER — Other Ambulatory Visit: Payer: Self-pay | Admitting: Internal Medicine

## 2013-11-24 DIAGNOSIS — Z955 Presence of coronary angioplasty implant and graft: Secondary | ICD-10-CM

## 2013-11-25 ENCOUNTER — Encounter: Payer: Self-pay | Admitting: Gastroenterology

## 2013-11-25 ENCOUNTER — Ambulatory Visit (INDEPENDENT_AMBULATORY_CARE_PROVIDER_SITE_OTHER): Payer: Medicare Other | Admitting: Gastroenterology

## 2013-11-25 ENCOUNTER — Telehealth: Payer: Self-pay

## 2013-11-25 ENCOUNTER — Other Ambulatory Visit (INDEPENDENT_AMBULATORY_CARE_PROVIDER_SITE_OTHER): Payer: Medicare Other

## 2013-11-25 VITALS — BP 140/50 | HR 48 | Ht 73.0 in | Wt 211.4 lb

## 2013-11-25 DIAGNOSIS — I2510 Atherosclerotic heart disease of native coronary artery without angina pectoris: Secondary | ICD-10-CM

## 2013-11-25 DIAGNOSIS — R634 Abnormal weight loss: Secondary | ICD-10-CM

## 2013-11-25 DIAGNOSIS — R197 Diarrhea, unspecified: Secondary | ICD-10-CM

## 2013-11-25 DIAGNOSIS — Z86010 Personal history of colonic polyps: Secondary | ICD-10-CM

## 2013-11-25 LAB — IGA: IgA: 111 mg/dL (ref 68–378)

## 2013-11-25 MED ORDER — PEG-KCL-NACL-NASULF-NA ASC-C 100 G PO SOLR: 1.0000 | Freq: Once | ORAL | Status: DC

## 2013-11-25 NOTE — Telephone Encounter (Signed)
I apologize for the inconvenience. I saw you as the prescriber of patient's plavix in EPIC and assumed you took care of this medication for him. I didn't realize he was followed by another provider for this. I will forward to Dr [name] instead. Have a great day!

## 2013-11-25 NOTE — Patient Instructions (Addendum)
Your physician has requested that you go to the basement for the following lab work before leaving today:Stool pathogen panel, Fecal elastase, TTG, IGA.  You have been scheduled for an endoscopy and colonoscopy with propofol. Please follow the written instructions given to you at your visit today. Please pick up your prep at the pharmacy within the next 1-3 days. If you use inhalers (even only as needed), please bring them with you on the day of your procedure. Your physician has requested that you go to www.startemmi.com and enter the access code given to you at your visit today. This web site gives a general overview about your procedure. However, you should still follow specific instructions given to you by our office regarding your preparation for the procedure.  Please stay on a Lactose free diet x 1 week to see if this improves your symptoms. Handout was given.   Thank you for choosing me and [facility].  Pricilla Riffle. Dagoberto Ligas., MD., Marval Regal  cc: [name], MD

## 2013-11-25 NOTE — Telephone Encounter (Signed)
  11/25/2013   RE: GAEGE DAN DOB: 06-Jan-1941 MRN: [id number]   Dear Dr. Melford Aase,    We have scheduled the above patient for an endoscopic procedure. Our records show that he is on anticoagulation therapy.   Please advise as to how long the patient may come off his therapy of Plavix prior to the procedure, which is scheduled for 12/04/13.  Please fax back/ or route your response to Ernest at [phone].   Sincerely,    Marzella Schlein, CMA Lucio Edward, MD

## 2013-11-25 NOTE — Telephone Encounter (Signed)
Please call Endo and ask for AManda lewellyn and advise Mr Clendenon plavix changes need to be made by his Cardiologist DR [name].

## 2013-11-25 NOTE — Progress Notes (Signed)
    History of Present Illness: This is a 73 year old male accompanied by his wife complaining of a one year history of diarrhea, weight loss and crampy abdominal pain. He has a history of adenomatous colon polyps and underwent colonoscopies by Dr. Watt Climes in 2003 and 2008 by patient report. He relates multiple soft stools per day associated with mild lower abdominal cramping for one year. He frequently has stools in the middle the night. Although he has slightly modified his diet over the past year he has noted a 25 pound weight loss with a very good appetite. Recent blood work was remarkable for hemoglobin of 11.2 and elevated creatinine with a history of CKD. Iron studies revealed iron deficiency. Stool Hemoccult testing was negative in March. CT scan of the abdomen/pelvis on April 30th showed diverticulosis, atherosclerosis and a left kidney stone. Denies constipation, change in stool caliber, melena, hematochezia, nausea, vomiting, dysphagia, reflux symptoms, chest pain.  Review of Systems: Pertinent positive and negative review of systems were noted in the above HPI section. All other review of systems were otherwise negative.  Current Medications, Allergies, Past Medical History, Past Surgical History, Family History and Social History were reviewed in [employer] record.  Physical Exam: General: Well developed , well nourished, no acute distress Head: Normocephalic and atraumatic Eyes:  sclerae anicteric, EOMI Ears: Normal auditory acuity Mouth: No deformity or lesions Neck: Supple, no masses or thyromegaly Lungs: Clear throughout to auscultation Heart: Regular rate and rhythm; no murmurs, rubs or bruits Abdomen: Soft, non tender and non distended. No masses, hepatosplenomegaly or hernias noted. Normal Bowel sounds Rectal: Deferred to colonoscopy Musculoskeletal: Symmetrical with no gross deformities  Skin: No lesions on visible extremities Pulses:  Normal pulses  noted Extremities: No clubbing, cyanosis, edema or deformities noted Neurological: Alert oriented x 4, grossly nonfocal Cervical Nodes:  No significant cervical adenopathy Inguinal Nodes: No significant inguinal adenopathy Psychological:  Alert and cooperative. Normal mood and affect  Assessment and Recommendations:  1. Diarrhea, weight loss, mild crampy lower abdominal pain. Rule out malabsorption, infection, IBD, neoplasm. Obtain stool GI pathogen panel, fecal elastase, IgA and tTG. Complete lactose avoidance diet for 7 days. Schedule colonoscopy and EGD. The risks, benefits, and alternatives to colonoscopy with possible biopsy and possible polypectomy were discussed with the patient and they consent to proceed. The risks, benefits, and alternatives to endoscopy with possible biopsy and possible dilation were discussed with the patient and they consent to proceed.   2. Personal history of adenomatous colon polyps. Request records from Dr. Watt Climes. Last colonoscopy apparently in 2008. Schedule colonoscopy as above.  3. Fe deficiency anemia noted in December 2014. Hemoccult negative stool. Colonoscopy and EGD as above.  4. CKD.  5. Coronary artery disease status post stent placement on Plavix. Risks benefits and alternatives to a 5 day hold Plavix discussed with the patient and he consents to proceed. Obtain clearance to hold Plavix as above from his prescribing physician

## 2013-11-26 ENCOUNTER — Other Ambulatory Visit: Payer: Medicare Other

## 2013-11-26 ENCOUNTER — Telehealth: Payer: Self-pay | Admitting: Cardiovascular Disease

## 2013-11-26 DIAGNOSIS — R197 Diarrhea, unspecified: Secondary | ICD-10-CM

## 2013-11-26 LAB — TISSUE TRANSGLUTAMINASE, IGA: TISSUE TRANSGLUTAMINASE AB, IGA: 1.9 U/mL (ref <20)

## 2013-11-26 NOTE — Telephone Encounter (Signed)
Estill Bamberg,  Reviewed with Dr. Debara Pickett, as Dr. [name] out of office.  Ok to hold plavix and asa x 5 days prior to colonoscopy/endoscopy.  Tommy Medal PharmD CPP

## 2013-11-26 NOTE — Telephone Encounter (Signed)
Per [facility], Dr. [name] will not be back until next week to see patient's so I could not get an answer until then. Told Billy that we unfortunately need an answer as soon as possible. She states I can route this message for Pharmacist and they can give me answer. So please advise if patient can come off Plavix before Endoscopy/Colonoscopy scheduled for 12/04/13.

## 2013-11-26 NOTE — Telephone Encounter (Signed)
Closed encounter °

## 2013-11-26 NOTE — Telephone Encounter (Signed)
Informed patient to he can hold Plavix 5 days before his procedure. Pt verbalized understanding.

## 2013-11-27 LAB — GASTROINTESTINAL PATHOGEN PANEL PCR
C. difficile Tox A/B, PCR: NEGATIVE
CAMPYLOBACTER, PCR: NEGATIVE
CRYPTOSPORIDIUM, PCR: NEGATIVE
E coli (ETEC) LT/ST PCR: NEGATIVE
E coli (STEC) stx1/stx2, PCR: NEGATIVE
E coli 0157, PCR: NEGATIVE
GIARDIA LAMBLIA, PCR: NEGATIVE
Norovirus, PCR: NEGATIVE
ROTAVIRUS, PCR: NEGATIVE
Salmonella, PCR: NEGATIVE
Shigella, PCR: POSITIVE — CR

## 2013-11-28 NOTE — Telephone Encounter (Signed)
I have received a report of trauma [facility] at about positive Shigella culture on this patient. I have sent Cipro 500 mg twice a day dispense 30, 1 by mouth twice a day one refill

## 2013-12-02 ENCOUNTER — Ambulatory Visit (INDEPENDENT_AMBULATORY_CARE_PROVIDER_SITE_OTHER): Payer: Medicare Other | Admitting: Cardiovascular Disease

## 2013-12-02 ENCOUNTER — Encounter: Payer: Self-pay | Admitting: Cardiovascular Disease

## 2013-12-02 VITALS — BP 148/56 | HR 50 | Ht 75.0 in | Wt 210.0 lb

## 2013-12-02 DIAGNOSIS — I10 Essential (primary) hypertension: Secondary | ICD-10-CM

## 2013-12-02 DIAGNOSIS — I714 Abdominal aortic aneurysm, without rupture, unspecified: Secondary | ICD-10-CM | POA: Insufficient documentation

## 2013-12-02 DIAGNOSIS — E785 Hyperlipidemia, unspecified: Secondary | ICD-10-CM

## 2013-12-02 DIAGNOSIS — I779 Disorder of arteries and arterioles, unspecified: Secondary | ICD-10-CM

## 2013-12-02 DIAGNOSIS — I25810 Atherosclerosis of coronary artery bypass graft(s) without angina pectoris: Secondary | ICD-10-CM

## 2013-12-02 DIAGNOSIS — I739 Peripheral vascular disease, unspecified: Secondary | ICD-10-CM

## 2013-12-02 NOTE — Assessment & Plan Note (Signed)
Status post LAD and diagonal branch stenting back in 2006, 2007 at 2008. Cardiac catheterization performed by Dr. Ellouise Newer 06/09/09 revealed widely patent stents after a false positive Myoview. He denies chest pain or shortness of breath.

## 2013-12-02 NOTE — Patient Instructions (Signed)
Your physician wants you to follow-up in: 1 year with Dr Berry. You will receive a reminder letter in the mail two months in advance. If you don't receive a letter, please call our office to schedule the follow-up appointment.  

## 2013-12-02 NOTE — Assessment & Plan Note (Addendum)
Well-controlled on current medications 

## 2013-12-02 NOTE — Progress Notes (Signed)
12/02/2013 Terry Garner   [birth year]  [id number]  Primary Physician Garner,Terry DAVID, MD Primary Cardiologist: Terry Harp MD Terry Garner   HPI:  Terry Garner is a 73 year old moderately overweight married Caucasian male father of one child formally a patient of Dr. Carlean Garner. [facility]. He has a history of CAD status post LAD and diagonal branch stenting back in 2006, 2007 and 2008. He underwent cardiac catheterization by Dr. Nona Dell 06/09/09 revealing widely patent stents after a false positive Myoview. His other problems include chronic branch block, treated hypertension and hyperlipidemia. He denies chest pain or shortness of breath. He does have moderate carotid disease with, duplex ultrasound. He is neurologically asymptomatic on aspirin and Plavix.    Current Outpatient Prescriptions  Medication Sig Dispense Refill  . allopurinol (ZYLOPRIM) 300 MG tablet Take 1 tablet (300 mg total) by mouth daily.  90 tablet  0  . amLODipine (NORVASC) 5 MG tablet Take 1 tablet (5 mg total) by mouth daily.  90 tablet  1  . Cholecalciferol (VITAMIN D3) 2000 UNITS TABS Take 1 tablet by mouth 2 (two) times daily.      . ciprofloxacin (CIPRO) 500 MG tablet Take 500 mg by mouth 2 (two) times daily.       . fenofibrate micronized (LOFIBRA) 134 MG capsule Take 134 mg by mouth daily before breakfast.      . hyoscyamine (LEVSIN, ANASPAZ) 0.125 MG tablet Take 1 tablet (0.125 mg total) by mouth every 4 (four) hours as needed.  60 tablet  0  . latanoprost (XALATAN) 0.005 % ophthalmic solution Place 1 drop into both eyes daily.       [name] loperamide (IMODIUM) 2 MG capsule Take 2 mg by mouth as needed for diarrhea or loose stools.      [name] losartan-hydrochlorothiazide (HYZAAR) 100-25 MG per tablet Take 1 tablet by mouth daily.       . peg 3350 powder (MOVIPREP) 100 G SOLR Take 1 kit (200 g total) by mouth once.  1 kit  0  . rosuvastatin (CRESTOR) 5 MG tablet Take 1 tablet (5 mg total) by mouth at  bedtime.  42 tablet  0  . aspirin EC 81 MG tablet Take 81 mg by mouth daily.      . clopidogrel (PLAVIX) 75 MG tablet TAKE ONE TABLET BY MOUTH EVERY DAY FOR HEART STENTS.  90 tablet  0   No current facility-administered medications for this visit.    Allergies  Allergen Reactions  . Toprol Xl [Metoprolol Tartrate] Other (See Comments)    Bradycardia with beta blockers  . Lipitor [Atorvastatin]     Myalgias   . Coumadin [Warfarin Sodium] Rash  . Tape Rash    Clear tape    History   Social History  . Marital Status: Married    Spouse Name: N/A    Number of Children: N/A  . Years of Education: N/A   Occupational History  . Not on file.   Social History Main Topics  . Smoking status: Never Smoker   . Smokeless tobacco: Never Used  . Alcohol Use: No  . Drug Use: No  . Sexual Activity: Not on file   Other Topics Concern  . Not on file   Social History Narrative  . No narrative on file     Review of Systems: General: negative for chills, fever, night sweats or weight changes.  Cardiovascular: negative for chest pain, dyspnea on exertion, edema, orthopnea, palpitations, paroxysmal nocturnal dyspnea  or shortness of breath Dermatological: negative for rash Respiratory: negative for cough or wheezing Urologic: negative for hematuria Abdominal: negative for nausea, vomiting, diarrhea, bright red blood per rectum, melena, or hematemesis Neurologic: negative for visual changes, syncope, or dizziness All other systems reviewed and are otherwise negative except as noted above.    Blood pressure 148/56, pulse 50, height $RemoveBe'6\' 3"'NYnisMRJa$  (1.905 m), weight 210 lb (95.255 kg).  General appearance: alert and no distress Neck: no adenopathy, no JVD, supple, symmetrical, trachea midline, thyroid not enlarged, symmetric, no tenderness/mass/nodules and soft bilateral carotid bruits left louder than right Lungs: clear to auscultation bilaterally Heart: regular rate and rhythm, S1, S2 normal,  no murmur, click, rub or gallop Extremities: extremities normal, atraumatic, no cyanosis or edema  EKG sinus bradycardia at 50 with right bundle-branch block unchanged from prior EKGs  ASSESSMENT AND PLAN:   Hypertension Well-controlled on current medications  Hyperlipidemia On statin therapy with recent lipid profile performed 09/01/13 revealed a total cholesterol of 158, LDL 89 HDL of 44  Carotid artery disease Mild left internal carotid artery stenosis by duplex ultrasound  Abdominal aortic aneurysm Measures 3.3 x 3.5 cm. Recheck in one year  ASCAD Status post LAD and diagonal branch stenting back in 2006, 2007 at 2008. Cardiac catheterization performed by Dr. Ellouise Newer 06/09/09 revealed widely patent stents after a false positive Myoview. He denies chest pain or shortness of breath.      Terry Harp MD FACP,FACC,FAHA, [facility] 12/02/2013 4:51 PM

## 2013-12-02 NOTE — Assessment & Plan Note (Signed)
Mild left internal carotid artery stenosis by duplex ultrasound

## 2013-12-02 NOTE — Assessment & Plan Note (Signed)
On statin therapy with recent lipid profile performed 09/01/13 revealed a total cholesterol of 158, LDL 89 HDL of 44

## 2013-12-02 NOTE — Assessment & Plan Note (Signed)
Measures 3.3 x 3.5 cm. Recheck in one year

## 2013-12-03 ENCOUNTER — Ambulatory Visit: Payer: Self-pay | Admitting: Physician Assistant

## 2013-12-04 ENCOUNTER — Encounter: Payer: Medicare Other | Admitting: Gastroenterology

## 2013-12-04 ENCOUNTER — Encounter: Payer: Self-pay | Admitting: Gastroenterology

## 2013-12-04 ENCOUNTER — Ambulatory Visit (AMBULATORY_SURGERY_CENTER): Payer: Medicare Other | Admitting: Gastroenterology

## 2013-12-04 VITALS — BP 146/57 | HR 51 | Temp 97.1°F | Resp 13 | Ht 73.0 in | Wt 211.0 lb

## 2013-12-04 DIAGNOSIS — Z86010 Personal history of colonic polyps: Secondary | ICD-10-CM

## 2013-12-04 DIAGNOSIS — R197 Diarrhea, unspecified: Secondary | ICD-10-CM

## 2013-12-04 DIAGNOSIS — D126 Benign neoplasm of colon, unspecified: Secondary | ICD-10-CM

## 2013-12-04 DIAGNOSIS — R634 Abnormal weight loss: Secondary | ICD-10-CM

## 2013-12-04 DIAGNOSIS — D509 Iron deficiency anemia, unspecified: Secondary | ICD-10-CM

## 2013-12-04 LAB — PANCREATIC ELASTASE, FECAL: PANCREATIC ELASTASE-1, STL: 430 ug/g

## 2013-12-04 MED ORDER — SODIUM CHLORIDE 0.9 % IV SOLN: 500.0000 mL | INTRAVENOUS | Status: DC

## 2013-12-04 NOTE — Progress Notes (Signed)
Pt stable to RR 

## 2013-12-04 NOTE — Patient Instructions (Signed)
YOU HAD AN ENDOSCOPIC PROCEDURE TODAY AT THE [facility]: Refer to the procedure report that was given to you for any specific questions about what was found during the examination.  If the procedure report does not answer your questions, please call your gastroenterologist to clarify.  If you requested that your care partner not be given the details of your procedure findings, then the procedure report has been included in a sealed envelope for you to review at your convenience later.  YOU SHOULD EXPECT: Some feelings of bloating in the abdomen. Passage of more gas than usual.  Walking can help get rid of the air that was put into your GI tract during the procedure and reduce the bloating. If you had a lower endoscopy (such as a colonoscopy or flexible sigmoidoscopy) you may notice spotting of blood in your stool or on the toilet paper. If you underwent a bowel prep for your procedure, then you may not have a normal bowel movement for a few days.  DIET: Your first meal following the procedure should be a light meal and then it is ok to progress to your normal diet.  A half-sandwich or bowl of soup is an example of a good first meal.  Heavy or fried foods are harder to digest and may make you feel nauseous or bloated.  Likewise meals heavy in dairy and vegetables can cause extra gas to form and this can also increase the bloating.  Drink plenty of fluids but you should avoid alcoholic beverages for 24 hours.  ACTIVITY: Your care partner should take you home directly after the procedure.  You should plan to take it easy, moving slowly for the rest of the day.  You can resume normal activity the day after the procedure however you should NOT DRIVE or use heavy machinery for 24 hours (because of the sedation medicines used during the test).    SYMPTOMS TO REPORT IMMEDIATELY: A gastroenterologist can be reached at any hour.  During normal business hours, 8:30 AM to 5:00 PM Monday through Friday,  call [phone].  After hours and on weekends, please call the GI answering service at [phone] who will take a message and have the physician on call contact you.   Following lower endoscopy (colonoscopy or flexible sigmoidoscopy):  Excessive amounts of blood in the stool  Significant tenderness or worsening of abdominal pains  Swelling of the abdomen that is new, acute  Fever of 100F or higher  Following upper endoscopy (EGD)  Vomiting of blood or coffee ground material  New chest pain or pain under the shoulder blades  Painful or persistently difficult swallowing  New shortness of breath  Fever of 100F or higher  Black, tarry-looking stools  FOLLOW UP: If any biopsies were taken you will be contacted by phone or by letter within the next 1-3 weeks.  Call your gastroenterologist if you have not heard about the biopsies in 3 weeks.  Our staff will call the home number listed on your records the next business day following your procedure to check on you and address any questions or concerns that you may have at that time regarding the information given to you following your procedure. This is a courtesy call and so if there is no answer at the home number and we have not heard from you through the emergency physician on call, we will assume that you have returned to your regular daily activities without incident.  SIGNATURES/CONFIDENTIALITY: You and/or your care   partner have signed paperwork which will be entered into your electronic medical record.  These signatures attest to the fact that that the information above on your After Visit Summary has been reviewed and is understood.  Full responsibility of the confidentiality of this discharge information lies with you and/or your care-partner.  EGD normal.  Polyps,diverticulosis, hemorrhoids, high fiber diet-handouts given  Consider colonoscopy in 5 years, pt will be 78.  Resume plavix tomorrow 12/05/13

## 2013-12-04 NOTE — Op Note (Signed)
[place]  [address] [place], [address]   COLONOSCOPY PROCEDURE REPORT PATIENT: Terry Garner, Terry Garner  MR#: [id number] BIRTHDATE: July 14, 1940 , 73  yrs. old GENDER: Male ENDOSCOPIST: Ladene Artist, MD, [facility] REFERRED [id number] Melford Aase, M.D. PROCEDURE DATE:  12/04/2013 PROCEDURE:   Colonoscopy with snare polypectomy First Screening Colonoscopy - Avg.  risk and is 50 yrs.  old or older - No.  Prior Negative Screening - Now for repeat screening. N/A  History of Adenoma - Now for follow-up colonoscopy & has been > or = to 3 yrs.  Yes hx of adenoma.  Has been 3 or more years since last colonoscopy.  Polyps Removed Today? Yes. ASA CLASS:   Class II INDICATIONS:Patient's personal history of adenomatous colon polyps, Unexplained diarrhea, Iron Deficiency Anemia, and Weight loss. MEDICATIONS: MAC sedation, administered by CRNA and propofol (Diprivan) 200mg  IV DESCRIPTION OF PROCEDURE:   After the risks benefits and alternatives of the procedure were thoroughly explained, informed consent was obtained.  A digital rectal exam revealed no abnormalities of the rectum.   The LB [id number]  endoscope was introduced through the anus and advanced to the cecum, which was identified by both the appendix and ileocecal valve. No adverse events experienced.   The quality of the prep was excellent, using MoviPrep  The instrument was then slowly withdrawn as the colon was fully examined.  COLON FINDINGS: A sessile polyp measuring 5 mm in size was found in the transverse colon.  A polypectomy was performed with a cold snare. The resection was complete and the polyp tissue was not retrieved. Moderate diverticulosis was noted in the descending colon and sigmoid colon.   The colon was otherwise normal.  There was no diverticulosis, inflammation, polyps or cancers unless previously stated. Retroflexed views revealed small internal hemorrhoids. The time to cecum=1 minutes 12  seconds.  Withdrawal time=12 minutes 26 seconds.  The scope was withdrawn and the procedure completed. COMPLICATIONS: There were no complications.  ENDOSCOPIC IMPRESSION: 1.   Sessile polyp measuring 5 mm in the transverse colon; polypectomy performed with a cold snare 2.   Moderate diverticulosis in the descending colon and sigmoid colon 3.   Small internal hemorrhoids  RECOMMENDATIONS: 1.  Consider repeat Colonoscopy in 5 years, he will be 78 at that time 2.  EGD today 3.  Resume Plavix tomorrow  eSigned:  Ladene Artist, MD, [facility] 12/04/2013 10:41 AM

## 2013-12-04 NOTE — Progress Notes (Signed)
Called to room to assist during endoscopic procedure.  Patient ID and intended procedure confirmed with present staff. Received instructions for my participation in the procedure from the performing physician.  

## 2013-12-04 NOTE — Op Note (Signed)
[place]  [address]. [place], [address]   ENDOSCOPY PROCEDURE REPORT  PATIENT: Terry Garner, Terry Garner  MR#: [id number] BIRTHDATE: 17-Dec-1940 , 14  yrs. old GENDER: Male ENDOSCOPIST: Ladene Artist, MD, [facility] REFERRED BY:  [name], M.D. PROCEDURE DATE:  12/04/2013 PROCEDURE:  EGD, diagnostic ASA CLASS:     Class II INDICATIONS:  Iron deficiency anemia.   Weight loss. MEDICATIONS: MAC sedation, administered by CRNA, There was residual sedation effect present from prior procedure, and propofol (Diprivan) 100mg  IV TOPICAL ANESTHETIC: none DESCRIPTION OF PROCEDURE: After the risks benefits and alternatives of the procedure were thoroughly explained, informed consent was obtained.  The LB [id number] endoscope was introduced through the mouth and advanced to the second portion of the duodenum. Without limitations.  The instrument was slowly withdrawn as the mucosa was fully examined.    ESOPHAGUS: The mucosa of the esophagus appeared normal. STOMACH: The mucosa and folds of the stomach appeared normal. DUODENUM: The duodenal mucosa showed no abnormalities in the bulb and second portion of the duodenum. Retroflexed views revealed no abnormalities.  The scope was then withdrawn from the patient and the procedure completed.  COMPLICATIONS: There were no complications.  ENDOSCOPIC IMPRESSION: 1.   The EGD appeared normal  RECOMMENDATIONS: 1.  Resume Plavix tomorrow   eSigned:  Ladene Artist, MD, [facility] 12/04/2013 10:51 AM

## 2013-12-07 ENCOUNTER — Telehealth: Payer: Self-pay | Admitting: *Deleted

## 2013-12-07 NOTE — Telephone Encounter (Signed)
  Follow up Call-  Call back number 12/04/2013  Post procedure Call Back phone  # [phone]  Permission to leave phone message Yes     Patient questions:  Do you have a fever, pain , or abdominal swelling? no Pain Score  0 *  Have you tolerated food without any problems? yes  Have you been able to return to your normal activities? yes  Do you have any questions about your discharge instructions: Diet   no Medications  no Follow up visit  no  Do you have questions or concerns about your Care? no  Actions: * If pain score is 4 or above: No action needed, pain <4.

## 2013-12-08 ENCOUNTER — Ambulatory Visit (INDEPENDENT_AMBULATORY_CARE_PROVIDER_SITE_OTHER): Payer: Medicare Other | Admitting: Emergency Medicine

## 2013-12-08 ENCOUNTER — Encounter: Payer: Self-pay | Admitting: Emergency Medicine

## 2013-12-08 VITALS — BP 136/62 | HR 52 | Temp 98.0°F | Resp 16 | Ht 73.75 in | Wt 211.0 lb

## 2013-12-08 DIAGNOSIS — I10 Essential (primary) hypertension: Secondary | ICD-10-CM

## 2013-12-08 DIAGNOSIS — E782 Mixed hyperlipidemia: Secondary | ICD-10-CM

## 2013-12-08 DIAGNOSIS — R197 Diarrhea, unspecified: Secondary | ICD-10-CM

## 2013-12-08 LAB — CBC WITH DIFFERENTIAL/PLATELET
BASOS ABS: 0.1 10*3/uL (ref 0.0–0.1)
Basophils Relative: 1 % (ref 0–1)
EOS PCT: 5 % (ref 0–5)
Eosinophils Absolute: 0.3 10*3/uL (ref 0.0–0.7)
HCT: 32 % — ABNORMAL LOW (ref 39.0–52.0)
Hemoglobin: 10.9 g/dL — ABNORMAL LOW (ref 13.0–17.0)
LYMPHS PCT: 17 % (ref 12–46)
Lymphs Abs: 0.9 10*3/uL (ref 0.7–4.0)
MCH: 29.2 pg (ref 26.0–34.0)
MCHC: 34.1 g/dL (ref 30.0–36.0)
MCV: 85.8 fL (ref 78.0–100.0)
Monocytes Absolute: 0.4 10*3/uL (ref 0.1–1.0)
Monocytes Relative: 7 % (ref 3–12)
NEUTROS PCT: 70 % (ref 43–77)
Neutro Abs: 3.6 10*3/uL (ref 1.7–7.7)
PLATELETS: 217 10*3/uL (ref 150–400)
RBC: 3.73 MIL/uL — ABNORMAL LOW (ref 4.22–5.81)
RDW: 15.5 % (ref 11.5–15.5)
WBC: 5.2 10*3/uL (ref 4.0–10.5)

## 2013-12-08 NOTE — Patient Instructions (Addendum)
Fat and Cholesterol Control Diet STOP FENOFIBRATE! Fat and cholesterol levels in your blood and organs are influenced by your diet. High levels of fat and cholesterol may lead to diseases of the heart, small and large blood vessels, gallbladder, liver, and pancreas. CONTROLLING FAT AND CHOLESTEROL WITH DIET Although exercise and lifestyle factors are important, your diet is key. That is because certain foods are known to raise cholesterol and others to lower it. The goal is to balance foods for their effect on cholesterol and more importantly, to replace saturated and trans fat with other types of fat, such as monounsaturated fat, polyunsaturated fat, and omega-3 fatty acids. On average, a person should consume no more than 15 to 17 g of saturated fat daily. Saturated and trans fats are considered "bad" fats, and they will raise LDL cholesterol. Saturated fats are primarily found in animal products such as meats, butter, and cream. However, that does not mean you need to give up all your favorite foods. Today, there are good tasting, low-fat, low-cholesterol substitutes for most of the things you like to eat. Choose low-fat or nonfat alternatives. Choose round or loin cuts of red meat. These types of cuts are lowest in fat and cholesterol. Chicken (without the skin), fish, veal, and ground [place] breast are great choices. Eliminate fatty meats, such as hot dogs and salami. Even shellfish have little or no saturated fat. Have a 3 oz (85 g) portion when you eat lean meat, poultry, or fish. Trans fats are also called "partially hydrogenated oils." They are oils that have been scientifically manipulated so that they are solid at room temperature resulting in a longer shelf life and improved taste and texture of foods in which they are added. Trans fats are found in stick margarine, some tub margarines, cookies, crackers, and baked goods.  When baking and cooking, oils are a great substitute for butter. The  monounsaturated oils are especially beneficial since it is believed they lower LDL and raise HDL. The oils you should avoid entirely are saturated tropical oils, such as coconut and palm.  Remember to eat a lot from food groups that are naturally free of saturated and trans fat, including fish, fruit, vegetables, beans, grains (barley, rice, couscous, bulgur wheat), and pasta (without cream sauces).  IDENTIFYING FOODS THAT LOWER FAT AND CHOLESTEROL  Soluble fiber may lower your cholesterol. This type of fiber is found in fruits such as apples, vegetables such as broccoli, potatoes, and carrots, legumes such as beans, peas, and lentils, and grains such as barley. Foods fortified with plant sterols (phytosterol) may also lower cholesterol. You should eat at least 2 g per day of these foods for a cholesterol lowering effect.  Read package labels to identify low-saturated fats, trans fat free, and low-fat foods at the supermarket. Select cheeses that have only 2 to 3 g saturated fat per ounce. Use a heart-healthy tub margarine that is free of trans fats or partially hydrogenated oil. When buying baked goods (cookies, crackers), avoid partially hydrogenated oils. Breads and muffins should be made from whole grains (whole-wheat or whole oat flour, instead of "flour" or "enriched flour"). Buy non-creamy canned soups with reduced salt and no added fats.  FOOD PREPARATION TECHNIQUES  Never deep-fry. If you must fry, either stir-fry, which uses very little fat, or use non-stick cooking sprays. When possible, broil, bake, or roast meats, and steam vegetables. Instead of putting butter or margarine on vegetables, use lemon and herbs, applesauce, and cinnamon (for squash and sweet potatoes).  Use nonfat yogurt, salsa, and low-fat dressings for salads.  LOW-SATURATED FAT / LOW-FAT FOOD SUBSTITUTES Meats / Saturated Fat (g)  Avoid: Steak, marbled (3 oz/85 g) / 11 g  Choose: Steak, lean (3 oz/85 g) / 4 g  Avoid:  Hamburger (3 oz/85 g) / 7 g  Choose: Hamburger, lean (3 oz/85 g) / 5 g  Avoid: Ham (3 oz/85 g) / 6 g  Choose: Ham, lean cut (3 oz/85 g) / 2.4 g  Avoid: Chicken, with skin, dark meat (3 oz/85 g) / 4 g  Choose: Chicken, skin removed, dark meat (3 oz/85 g) / 2 g  Avoid: Chicken, with skin, light meat (3 oz/85 g) / 2.5 g  Choose: Chicken, skin removed, light meat (3 oz/85 g) / 1 g Dairy / Saturated Fat (g)  Avoid: Whole milk (1 cup) / 5 g  Choose: Low-fat milk, 2% (1 cup) / 3 g  Choose: Low-fat milk, 1% (1 cup) / 1.5 g  Choose: Skim milk (1 cup) / 0.3 g  Avoid: Hard cheese (1 oz/28 g) / 6 g  Choose: Skim milk cheese (1 oz/28 g) / 2 to 3 g  Avoid: Cottage cheese, 4% fat (1 cup) / 6.5 g  Choose: Low-fat cottage cheese, 1% fat (1 cup) / 1.5 g  Avoid: Ice cream (1 cup) / 9 g  Choose: Sherbet (1 cup) / 2.5 g  Choose: Nonfat frozen yogurt (1 cup) / 0.3 g  Choose: Frozen fruit bar / trace  Avoid: Whipped cream (1 tbs) / 3.5 g  Choose: Nondairy whipped topping (1 tbs) / 1 g Condiments / Saturated Fat (g)  Avoid: Mayonnaise (1 tbs) / 2 g  Choose: Low-fat mayonnaise (1 tbs) / 1 g  Avoid: Butter (1 tbs) / 7 g  Choose: Extra light margarine (1 tbs) / 1 g  Avoid: Coconut oil (1 tbs) / 11.8 g  Choose: Olive oil (1 tbs) / 1.8 g  Choose: Corn oil (1 tbs) / 1.7 g  Choose: Safflower oil (1 tbs) / 1.2 g  Choose: Sunflower oil (1 tbs) / 1.4 g  Choose: Soybean oil (1 tbs) / 2.4 g  Choose: Canola oil (1 tbs) / 1 g Document Released: 06/11/2005 Document Revised: 10/06/2012 Document Reviewed: 11/30/2010 [employer] Patient Information 2014 [place], [place].   Diverticulitis Small pockets or "bubbles" can develop in the wall of the intestine. Diverticulitis is when those pockets become infected and inflamed. This causes stomach pain (usually on the left side). HOME CARE  Take all medicine as told by your doctor.  Try a clear liquid diet (broth, tea, or water) for as long  as told by your doctor.  Keep all follow-up visits with your doctor.  You may be put on a low-fiber diet once you start feeling better. Here are foods that have low-fiber:  White breads, cereals, rice, and pasta.  Cooked fruits and vegetables or soft fresh fruits and vegetables without the skin.  Ground or well-cooked tender beef, ham, veal, lamb, pork, or poultry.  Eggs and seafood.  After you are doing well on the low-fiber diet, you may be put on a high-fiber diet. Here are ways to increase your fiber:  Choose whole-grain breads, cereals, pasta, and brown rice.  Choose fruits and vegetables with skin on. Do not overcook the vegetables.  Choose nuts, seeds, legumes, dried peas, beans, and lentils.  Look for food products that have more than 3 grams of fiber per serving on the food label. GET HELP RIGHT  AWAY IF:  Your pain does not get better or gets worse.  You have trouble eating food.  You are not pooping (having bowel movements) like normal.  You have a temperature by mouth above 102 F (38.9 C), not controlled by medicine.  You keep throwing up (vomiting).  You have bloody or black, tarry poop (stools).  You are getting worse and not better. MAKE SURE YOU:   Understand these instructions.  Will watch your condition.  Will get help right away if you are not doing well or get worse. Document Released: 11/28/2007 Document Revised: 09/03/2011 Document Reviewed: 05/02/2009 [facility] Patient Information 2014 [place], [place].

## 2013-12-08 NOTE — Progress Notes (Signed)
Subjective:    Patient ID: Terry Garner, male    DOB: May 16, 1941, 73 y.o.   MRN: [id number]  HPI Comments: 73 yo WM presents for 3 month F/U for HTN, Cholesterol, Pre-Dm, D. Deficient. He is still having diarrhea but had mild improvement with Cipro. He d/c milk w/o any improvements. He did not seen any difference with Crestor samples vs old cholesterol RX with diarrhea. He is not exercising routinely but keeps active.  WBC             6.0   10/21/2013 HGB            11.2   10/21/2013 HCT            32.8   10/21/2013 PLT             194   10/21/2013 GLUCOSE          93   10/26/2013 CHOL            158   09/01/2013 TRIG            123   09/01/2013 HDL              44   09/01/2013 LDLCALC          89   09/01/2013 ALT              13   10/21/2013 AST              19   10/21/2013 NA              141   10/26/2013 K               5.7   10/26/2013 CL              110   10/26/2013 CREATININE     2.62   10/26/2013 BUN              41   10/26/2013 CO2              24   10/26/2013 TSH           1.477   10/21/2013 PSA            2.59   09/01/2013 INR            1.11   06/04/2012 HGBA1C          5.4   09/01/2013 MICROALBUR    11.26   09/01/2013   Hyperlipidemia  Hypertension      Medication List       This list is accurate as of: 12/08/13 11:15 AM.  Always use your most recent med list.               allopurinol 300 MG tablet  Commonly known as:  ZYLOPRIM  Take 1 tablet (300 mg total) by mouth daily.     amLODipine 5 MG tablet  Commonly known as:  NORVASC  Take 1 tablet (5 mg total) by mouth daily.     aspirin EC 81 MG tablet  Take 81 mg by mouth daily.     ciprofloxacin 500 MG tablet  Commonly known as:  CIPRO  Take 500 mg by mouth 2 (two) times daily.     clopidogrel 75 MG tablet  Commonly known as:  PLAVIX  TAKE ONE TABLET BY MOUTH EVERY DAY FOR HEART STENTS.     fenofibrate micronized 134 MG capsule  Commonly known as:  LOFIBRA  Take 134 mg by mouth daily before breakfast.     latanoprost 0.005 % ophthalmic solution  Commonly known as:  XALATAN  Place 1 drop into both eyes daily.     loperamide 2 MG capsule  Commonly known as:  IMODIUM  Take 2 mg by mouth as needed for diarrhea or loose stools.     losartan-hydrochlorothiazide 100-25 MG per tablet  Commonly known as:  HYZAAR  Take 1 tablet by mouth daily.     pravastatin 40 MG tablet  Commonly known as:  PRAVACHOL  Take 40 mg by mouth daily.     Vitamin D3 2000 UNITS Tabs  Take 1 tablet by mouth 2 (two) times daily.       Allergies  Allergen Reactions  . Toprol Xl [Metoprolol Tartrate] Other (See Comments)    Bradycardia with beta blockers  . Lipitor [Atorvastatin]     Myalgias   . Coumadin [Warfarin Sodium] Rash  . Tape Rash    Clear tape   Past Medical History  Diagnosis Date  . Hypertension   . Heart murmur   . Chronic kidney disease     CKD, right renal artery stenosis  . AAA (abdominal aortic aneurysm)     3.6 cm (01/09/12 ultrasound)  . Carotid artery disease   . Coronary artery disease     mild left internal carotid artery stenosis  . Arthritis   . Gout   . Vitamin D deficiency   . Tubular adenoma of colon 2003    Dr. Watt Climes  . Hyperlipidemia   . Allergy   . Glaucoma   . Cataract      Review of Systems  Gastrointestinal: Positive for diarrhea.  All other systems reviewed and are negative.  BP 136/62  Pulse 52  Temp(Src) 98 F (36.7 C) (Temporal)  Resp 16  Ht 6' 1.75" (1.873 m)  Wt 211 lb (95.709 kg)  BMI 27.28 kg/m2     Objective:   Physical Exam  Nursing note and vitals reviewed. Constitutional: He is oriented to person, place, and time. He appears well-developed and well-nourished.  HENT:  Head: Normocephalic and atraumatic.  Right Ear: External ear normal.  Left Ear: External ear normal.  Nose: Nose normal.  Eyes: Conjunctivae and EOM are normal.  Neck: Normal range of motion. Neck supple. No JVD present. No thyromegaly present.  Cardiovascular: Normal  rate, regular rhythm, normal heart sounds and intact distal pulses.   Carotid bruits bilateral stable  Pulmonary/Chest: Effort normal and breath sounds normal.  Abdominal: Soft. Bowel sounds are normal. He exhibits no distension and no mass. There is no tenderness. There is no rebound and no guarding.  Musculoskeletal: Normal range of motion. He exhibits no edema and no tenderness.  Lymphadenopathy:    He has no cervical adenopathy.  Neurological: He is alert and oriented to person, place, and time. No cranial nerve deficit. Coordination normal.  Skin: Skin is warm and dry.  Psychiatric: He has a normal mood and affect. His behavior is normal. Judgment and thought content normal.          Assessment & Plan:  1.  3 month F/U for HTN, Cholesterol, Pre-Dm, D. Deficient. Needs healthy diet, cardio QD and obtain healthy weight. Check Labs, Check BP if >130/80 call office  2. Diarrhea- Keep f/u with GI. Stop Fenofibrate to see if any changes, advise continue antibiotics AD. ADD ALign after finishes Cipro # 21 SX given

## 2013-12-09 LAB — LIPID PANEL
CHOL/HDL RATIO: 3.7 ratio
CHOLESTEROL: 156 mg/dL (ref 0–200)
HDL: 42 mg/dL (ref >39)
LDL Cholesterol: 88 mg/dL (ref 0–99)
Triglycerides: 128 mg/dL (ref <150)
VLDL: 26 mg/dL (ref 0–40)

## 2013-12-09 LAB — HEPATIC FUNCTION PANEL
ALBUMIN: 3.9 g/dL (ref 3.5–5.2)
ALK PHOS: 44 U/L (ref 39–117)
ALT: 16 U/L (ref 0–53)
AST: 26 U/L (ref 0–37)
BILIRUBIN TOTAL: 0.6 mg/dL (ref 0.2–1.2)
Bilirubin, Direct: 0.2 mg/dL (ref 0.0–0.3)
Indirect Bilirubin: 0.4 mg/dL (ref 0.2–1.2)
TOTAL PROTEIN: 6.2 g/dL (ref 6.0–8.3)

## 2013-12-09 LAB — BASIC METABOLIC PANEL WITH GFR
BUN: 29 mg/dL — AB (ref 6–23)
CO2: 22 meq/L (ref 19–32)
Calcium: 9.9 mg/dL (ref 8.4–10.5)
Chloride: 110 mEq/L (ref 96–112)
Creat: 2.19 mg/dL — ABNORMAL HIGH (ref 0.50–1.35)
GFR, EST AFRICAN AMERICAN: 33 mL/min — AB
GFR, Est Non African American: 28 mL/min — ABNORMAL LOW
GLUCOSE: 81 mg/dL (ref 70–99)
Potassium: 4.9 mEq/L (ref 3.5–5.3)
SODIUM: 141 meq/L (ref 135–145)

## 2013-12-21 ENCOUNTER — Telehealth: Payer: Self-pay

## 2013-12-21 NOTE — Telephone Encounter (Signed)
Patient has rash from [place]. Per Kelby Aline, PA-C, patient is advised to stop Align and start Benadryl and Zyrtec. Patient's spouse is aware.

## 2013-12-28 ENCOUNTER — Other Ambulatory Visit: Payer: Self-pay | Admitting: Emergency Medicine

## 2014-01-05 ENCOUNTER — Other Ambulatory Visit: Payer: Self-pay | Admitting: Physician Assistant

## 2014-01-07 ENCOUNTER — Other Ambulatory Visit: Payer: Self-pay | Admitting: Emergency Medicine

## 2014-01-07 NOTE — Progress Notes (Signed)
Spoke with patient's wife and informed her of Melissa's recommendations.  She will call today for GI re-eval for patient.

## 2014-01-07 NOTE — Progress Notes (Signed)
Left message on machine at 1:22pm.

## 2014-01-14 ENCOUNTER — Ambulatory Visit: Payer: Self-pay | Admitting: Emergency Medicine

## 2014-01-27 ENCOUNTER — Ambulatory Visit (INDEPENDENT_AMBULATORY_CARE_PROVIDER_SITE_OTHER): Payer: Medicare Other | Admitting: Gastroenterology

## 2014-01-27 ENCOUNTER — Other Ambulatory Visit (INDEPENDENT_AMBULATORY_CARE_PROVIDER_SITE_OTHER): Payer: Medicare Other

## 2014-01-27 ENCOUNTER — Encounter: Payer: Self-pay | Admitting: Gastroenterology

## 2014-01-27 VITALS — BP 118/50 | HR 50 | Ht 73.0 in | Wt 206.0 lb

## 2014-01-27 DIAGNOSIS — D509 Iron deficiency anemia, unspecified: Secondary | ICD-10-CM

## 2014-01-27 DIAGNOSIS — R634 Abnormal weight loss: Secondary | ICD-10-CM

## 2014-01-27 DIAGNOSIS — R197 Diarrhea, unspecified: Secondary | ICD-10-CM

## 2014-01-27 LAB — CBC WITH DIFFERENTIAL/PLATELET
BASOS ABS: 0 10*3/uL (ref 0.0–0.1)
Basophils Relative: 0.6 % (ref 0.0–3.0)
Eosinophils Absolute: 0.3 10*3/uL (ref 0.0–0.7)
Eosinophils Relative: 5 % (ref 0.0–5.0)
HCT: 31.5 % — ABNORMAL LOW (ref 39.0–52.0)
HEMOGLOBIN: 10.3 g/dL — AB (ref 13.0–17.0)
Lymphocytes Relative: 13.7 % (ref 12.0–46.0)
Lymphs Abs: 0.7 10*3/uL (ref 0.7–4.0)
MCHC: 32.6 g/dL (ref 30.0–36.0)
MCV: 89.5 fl (ref 78.0–100.0)
MONOS PCT: 4.9 % (ref 3.0–12.0)
Monocytes Absolute: 0.3 10*3/uL (ref 0.1–1.0)
NEUTROS ABS: 4 10*3/uL (ref 1.4–7.7)
Neutrophils Relative %: 75.8 % (ref 43.0–77.0)
PLATELETS: 204 10*3/uL (ref 150.0–400.0)
RBC: 3.53 Mil/uL — ABNORMAL LOW (ref 4.22–5.81)
RDW: 14.8 % (ref 11.5–15.5)
WBC: 5.2 10*3/uL (ref 4.0–10.5)

## 2014-01-27 LAB — COMPREHENSIVE METABOLIC PANEL
ALT: 15 U/L (ref 0–53)
AST: 23 U/L (ref 0–37)
Albumin: 3.7 g/dL (ref 3.5–5.2)
Alkaline Phosphatase: 36 U/L — ABNORMAL LOW (ref 39–117)
BUN: 37 mg/dL — ABNORMAL HIGH (ref 6–23)
CALCIUM: 9.2 mg/dL (ref 8.4–10.5)
CO2: 22 meq/L (ref 19–32)
Chloride: 113 mEq/L — ABNORMAL HIGH (ref 96–112)
Creatinine, Ser: 2.7 mg/dL — ABNORMAL HIGH (ref 0.4–1.5)
GFR: 24.28 mL/min — AB (ref >60.00)
Glucose, Bld: 86 mg/dL (ref 70–99)
Potassium: 5.4 mEq/L — ABNORMAL HIGH (ref 3.5–5.1)
SODIUM: 141 meq/L (ref 135–145)
TOTAL PROTEIN: 6.3 g/dL (ref 6.0–8.3)
Total Bilirubin: 0.6 mg/dL (ref 0.2–1.2)

## 2014-01-27 NOTE — Patient Instructions (Addendum)
Your physician has requested that you go to the basement for the following lab work before leaving today: CBC, Cmet, GI pathogen panel.  You have been scheduled for a CT scan of the abdomen and pelvis at [facility] ([address]---this is in the same building as [facility]).   You are scheduled on 01/28/14 at 2:00pm. You should arrive 15 minutes prior to your appointment time for registration. Please follow the written instructions below on the day of your exam:  WARNING: IF YOU ARE ALLERGIC TO IODINE/X-RAY DYE, PLEASE NOTIFY RADIOLOGY IMMEDIATELY AT [phone]! YOU WILL BE GIVEN A 13 HOUR PREMEDICATION PREP.  1) Do not eat or drink anything after 11:00am (4 hours prior to your test)   You may take any medications as prescribed with a small amount of water except for the following: Metformin, Glucophage, Glucovance, Avandamet, Riomet, Fortamet, Actoplus Met, Janumet, Glumetza or Metaglip. The above medications must be held the day of the exam AND 48 hours after the exam.  If you have any questions regarding your exam or if you need to reschedule, you may call the CT department at [phone] between the hours of 8:00 am and 5:00 pm, Monday-Friday.  ________________________________________________________________________  Your follow up appointment with Dr. Fuller Plan is scheduled for 03/03/14 at 9:15am. If you need to reschedule or cancel please call [phone].  Thank you for choosing me and [facility].  Pricilla Riffle. Dagoberto Ligas., MD., Marval Regal

## 2014-01-27 NOTE — Progress Notes (Signed)
    History of Present Illness: This is a 73 year old male accompanied by his wife returning for followup of fatigue, weight loss, anemia and diarrhea. He has undergone was extensive evaluation with blood work, stool studies, abdominal/pelvic CT, colonoscopy and upper endoscopy. Abdominal aortic aneurysm, diverticulosis and a small colon polyp were noted. Shigella was noted on stool pathogen panel and treated with 15 day course of Cipro. He has had a 5 pound weight decrease since his last office visit despite a good appetite. He states an organic vinegar has helped control his diarrhea as has Imodium. He is frustrated that we have not uncovered cause for his symptoms.  Current Medications, Allergies, Past Medical History, Past Surgical History, Family History and Social History were reviewed in [employer] record.  Physical Exam: General: Well developed , well nourished, no acute distress Head: Normocephalic and atraumatic Eyes:  sclerae anicteric, EOMI Ears: Normal auditory acuity Mouth: No deformity or lesions Lungs: Clear throughout to auscultation Heart: Regular rate and rhythm; no murmurs, rubs or bruits Abdomen: Soft, non tender and non distended. No masses, hepatosplenomegaly or hernias noted. Normal Bowel sounds Musculoskeletal: Symmetrical with no gross deformities  Pulses:  Normal pulses noted Extremities: No clubbing, cyanosis, edema or deformities noted Neurological: Alert oriented x 4, grossly nonfocal Psychological:  Alert and cooperative. Normal mood and affect  Assessment and Recommendations:  1. Weight loss-no GI cause has been found. Schedule CT enterography. Consider tertiary center referral if symptoms persist. Return office visit in 4 weeks.  2. Diarrhea, controlled with Imodium as needed. Possible postinfectious diarrhea related to prior Shigella infection. Organic vinegar has worked as well. Repeat stool pathogen panel. CT enterography as above.  Consider tertiary center referral if symptoms persist. Return office visit in 4 weeks.  3. Iron deficiency anemia and fatigue. CMET and CBC today. If his hemoglobin remains low begin iron twice daily with meals.  4. CKD.   5. Personal history of adenomatous colon polyps. Consider surveillance colonoscopy in June 2020.  6. Coronary artery disease, AAA.

## 2014-01-28 ENCOUNTER — Other Ambulatory Visit: Payer: Medicare Other

## 2014-01-28 ENCOUNTER — Telehealth: Payer: Self-pay

## 2014-01-28 DIAGNOSIS — D509 Iron deficiency anemia, unspecified: Secondary | ICD-10-CM

## 2014-01-28 DIAGNOSIS — R197 Diarrhea, unspecified: Secondary | ICD-10-CM

## 2014-01-28 DIAGNOSIS — R634 Abnormal weight loss: Secondary | ICD-10-CM

## 2014-01-28 NOTE — Telephone Encounter (Signed)
Patient is scheduled for SBFT 02/01/14 11:00  notified the patient and his wife.  He does have follow up scheduled with his primary care

## 2014-01-28 NOTE — Telephone Encounter (Signed)
Message copied by Marlon Pel on Thu Jan 28, 2014  9:23 AM ------      Message from: Lucio Edward T      Created: Wed Jan 27, 2014 10:15 PM       SBFT      Please be sure he has apot with his PCP about worsening renal function.                   ----- Message -----         From: Kellie Moor, RN         Sent: 01/27/2014   5:02 PM           To: Ladene Artist, MD            Patient is scheduled for enterography tomorrow at 3:00.  His creatinine is too high 2.7.  Can't be done according to radiology.  Please advise alternative study.         ------

## 2014-02-01 ENCOUNTER — Ambulatory Visit (HOSPITAL_COMMUNITY): Payer: Medicare Other

## 2014-02-02 ENCOUNTER — Other Ambulatory Visit: Payer: Medicare Other

## 2014-02-02 DIAGNOSIS — R634 Abnormal weight loss: Secondary | ICD-10-CM

## 2014-02-02 DIAGNOSIS — R197 Diarrhea, unspecified: Secondary | ICD-10-CM

## 2014-02-03 ENCOUNTER — Ambulatory Visit (HOSPITAL_COMMUNITY)
Admission: RE | Admit: 2014-02-03 | Discharge: 2014-02-03 | Disposition: A | Discharge status: Home / Self Care | Payer: Medicare Other | Source: Ambulatory Visit | Attending: Gastroenterology | Admitting: Gastroenterology

## 2014-02-03 DIAGNOSIS — R197 Diarrhea, unspecified: Secondary | ICD-10-CM

## 2014-02-03 DIAGNOSIS — R634 Abnormal weight loss: Secondary | ICD-10-CM | POA: Insufficient documentation

## 2014-02-03 DIAGNOSIS — D509 Iron deficiency anemia, unspecified: Secondary | ICD-10-CM | POA: Diagnosis not present

## 2014-02-03 LAB — GASTROINTESTINAL PATHOGEN PANEL PCR
C. DIFFICILE TOX A/B, PCR: NEGATIVE
CRYPTOSPORIDIUM, PCR: NEGATIVE
Campylobacter, PCR: NEGATIVE
E COLI (ETEC) LT/ST, PCR: NEGATIVE
E coli (STEC) stx1/stx2, PCR: NEGATIVE
E coli 0157, PCR: NEGATIVE
GIARDIA LAMBLIA, PCR: NEGATIVE
Norovirus, PCR: NEGATIVE
Rotavirus A, PCR: NEGATIVE
SALMONELLA, PCR: NEGATIVE
Shigella, PCR: NEGATIVE

## 2014-03-03 ENCOUNTER — Telehealth: Payer: Self-pay | Admitting: Gastroenterology

## 2014-03-03 ENCOUNTER — Encounter: Payer: Self-pay | Admitting: Gastroenterology

## 2014-03-03 ENCOUNTER — Other Ambulatory Visit (INDEPENDENT_AMBULATORY_CARE_PROVIDER_SITE_OTHER): Payer: Medicare Other

## 2014-03-03 ENCOUNTER — Ambulatory Visit (INDEPENDENT_AMBULATORY_CARE_PROVIDER_SITE_OTHER): Payer: Medicare Other | Admitting: Gastroenterology

## 2014-03-03 ENCOUNTER — Other Ambulatory Visit: Payer: Self-pay | Admitting: Emergency Medicine

## 2014-03-03 ENCOUNTER — Other Ambulatory Visit: Payer: Self-pay | Admitting: Internal Medicine

## 2014-03-03 VITALS — BP 150/62 | HR 48 | Ht 73.0 in | Wt 207.0 lb

## 2014-03-03 DIAGNOSIS — D509 Iron deficiency anemia, unspecified: Secondary | ICD-10-CM

## 2014-03-03 DIAGNOSIS — R197 Diarrhea, unspecified: Secondary | ICD-10-CM

## 2014-03-03 DIAGNOSIS — R109 Unspecified abdominal pain: Secondary | ICD-10-CM

## 2014-03-03 LAB — CBC WITH DIFFERENTIAL/PLATELET
BASOS PCT: 0.7 % (ref 0.0–3.0)
Basophils Absolute: 0 10*3/uL (ref 0.0–0.1)
Eosinophils Absolute: 0.3 10*3/uL (ref 0.0–0.7)
Eosinophils Relative: 6.8 % — ABNORMAL HIGH (ref 0.0–5.0)
HCT: 30.5 % — ABNORMAL LOW (ref 39.0–52.0)
HEMOGLOBIN: 10.3 g/dL — AB (ref 13.0–17.0)
LYMPHS PCT: 16.8 % (ref 12.0–46.0)
Lymphs Abs: 0.8 10*3/uL (ref 0.7–4.0)
MCHC: 33.7 g/dL (ref 30.0–36.0)
MCV: 88.8 fl (ref 78.0–100.0)
MONOS PCT: 7.7 % (ref 3.0–12.0)
Monocytes Absolute: 0.4 10*3/uL (ref 0.1–1.0)
NEUTROS ABS: 3.4 10*3/uL (ref 1.4–7.7)
Neutrophils Relative %: 68 % (ref 43.0–77.0)
Platelets: 196 10*3/uL (ref 150.0–400.0)
RBC: 3.44 Mil/uL — AB (ref 4.22–5.81)
RDW: 15.5 % (ref 11.5–15.5)
WBC: 4.9 10*3/uL (ref 4.0–10.5)

## 2014-03-03 MED ORDER — METRONIDAZOLE 500 MG PO TABS: 500.0000 mg | ORAL_TABLET | Freq: Two times a day (BID) | ORAL | Status: DC

## 2014-03-03 MED ORDER — RIFAXIMIN 550 MG PO TABS: 550.0000 mg | ORAL_TABLET | Freq: Two times a day (BID) | ORAL | Status: DC

## 2014-03-03 NOTE — Patient Instructions (Addendum)
Your physician has requested that you go to the basement for the following lab work before leaving today:CBC.  We have sent the following medications to your pharmacy for you to pick up at your convenience:Xifaxan.  After you have finished the Xifaxan, please start a probiotic such as Align daily.  Take Imodium one tablet by mouth twice daily as needed for diarrhea.   You follow up with Dr. Fuller Plan is scheduled for 04/07/14 at 9:45am. If you need to reschedule or cancel please call [phone].   Thank you for choosing me and [facility].  Pricilla Riffle. Dagoberto Ligas., MD., Marval Regal  cc: [name], MD

## 2014-03-03 NOTE — Progress Notes (Addendum)
    History of Present Illness: This is a 73 year old male accompanied by his wife returning for followup of fatigue, weight loss, anemia and diarrhea. He has 2-3 BMs overnight and another 4-5 during the days. Imodium bid is very effective but he uses it infrequently. He notes crampy lower abdominal pain that is relieved by bowel movements. He has undergone was extensive evaluation with blood work, stool studies, abdominal/pelvic CT, colonoscopy, SBFT and upper endoscopy. Abdominal aortic aneurysm, diverticulosis and a small colon polyp were noted. Shigella was noted on stool pathogen panel and treated with 15 day course of Cipro. Repeat GI pathogen panel was negative. His weight is stable since his last office visit and he had a good appetite. He is frustrated that we have not uncovered cause for his symptoms.   Current Medications, Allergies, Past Medical History, Past Surgical History, Family History and Social History were reviewed in [employer] record.   Physical Exam:  General: Well developed , well nourished, no acute distress  Head: Normocephalic and atraumatic  Eyes: sclerae anicteric, EOMI  Ears: Normal auditory acuity  Mouth: No deformity or lesions  Lungs: Clear throughout to auscultation  Heart: Regular rate and rhythm; no murmurs, rubs or bruits  Abdomen: Soft, non tender and non distended. No masses, hepatosplenomegaly or hernias noted. Normal Bowel sounds  Musculoskeletal: Symmetrical with no gross deformities  Pulses: Normal pulses noted  Extremities: No clubbing, cyanosis, edema or deformities noted  Neurological: Alert oriented x 4, grossly nonfocal  Psychological: Alert and cooperative. Normal mood and affect   Assessment and Recommendations:   1. Weight loss-no GI cause has been found. Weight is currently stable.   2. Diarrhea, controlled with Imodium as needed. Crampy lower abdominal pain relieved by bowel movements. Repeat stool pathogen panel  was negative. SBFT was negative. Possible SB bacterial overgrowth.  Possible postinfectious diarrhea related to prior Shigella infection. Xifaxan bid for 10 days and then Align daily for 6 weeks. If his abdominal pain persists consider trial of antispasmodic. Consider tertiary center referral if symptoms persist. Return office visit in 4-6 weeks.   3. Iron deficiency anemia and fatigue. CBC today. Continue iron twice daily with meals.   4. CKD.   5. Personal history of adenomatous colon polyps. Consider surveillance colonoscopy in June 2020.   6. Coronary artery disease, AAA.

## 2014-03-03 NOTE — Telephone Encounter (Signed)
Prescription sent to patient's pharmacy and patient notified of change in medication.

## 2014-03-03 NOTE — Telephone Encounter (Signed)
We can try metronidazole 500 mg po bid for 14 days instead.

## 2014-03-03 NOTE — Telephone Encounter (Signed)
Patient's wife states Terry Garner is too expensive with insurance. It will cost them over 188 dollars. Did inform her that Terry Garner is a unique antibiotic and may not be another medication like it. Please advise.

## 2014-03-09 ENCOUNTER — Ambulatory Visit: Payer: Self-pay | Admitting: Internal Medicine

## 2014-03-12 ENCOUNTER — Encounter: Payer: Self-pay | Admitting: Internal Medicine

## 2014-03-12 ENCOUNTER — Ambulatory Visit (INDEPENDENT_AMBULATORY_CARE_PROVIDER_SITE_OTHER): Payer: Medicare Other | Admitting: Internal Medicine

## 2014-03-12 VITALS — BP 136/70 | HR 50 | Temp 97.8°F | Resp 16 | Ht 73.75 in | Wt 209.0 lb

## 2014-03-12 DIAGNOSIS — I10 Essential (primary) hypertension: Secondary | ICD-10-CM

## 2014-03-12 DIAGNOSIS — E785 Hyperlipidemia, unspecified: Secondary | ICD-10-CM

## 2014-03-12 DIAGNOSIS — I25709 Atherosclerosis of coronary artery bypass graft(s), unspecified, with unspecified angina pectoris: Secondary | ICD-10-CM

## 2014-03-12 DIAGNOSIS — E559 Vitamin D deficiency, unspecified: Secondary | ICD-10-CM

## 2014-03-12 DIAGNOSIS — I25810 Atherosclerosis of coronary artery bypass graft(s) without angina pectoris: Secondary | ICD-10-CM

## 2014-03-12 DIAGNOSIS — I779 Disorder of arteries and arterioles, unspecified: Secondary | ICD-10-CM

## 2014-03-12 DIAGNOSIS — Z23 Encounter for immunization: Secondary | ICD-10-CM

## 2014-03-12 DIAGNOSIS — Z79899 Other long term (current) drug therapy: Secondary | ICD-10-CM

## 2014-03-12 DIAGNOSIS — Z0000 Encounter for general adult medical examination without abnormal findings: Secondary | ICD-10-CM

## 2014-03-12 DIAGNOSIS — I739 Peripheral vascular disease, unspecified: Secondary | ICD-10-CM

## 2014-03-12 DIAGNOSIS — I714 Abdominal aortic aneurysm, without rupture, unspecified: Secondary | ICD-10-CM

## 2014-03-12 DIAGNOSIS — M109 Gout, unspecified: Secondary | ICD-10-CM

## 2014-03-12 DIAGNOSIS — M5126 Other intervertebral disc displacement, lumbar region: Secondary | ICD-10-CM

## 2014-03-12 DIAGNOSIS — R7309 Other abnormal glucose: Secondary | ICD-10-CM

## 2014-03-12 MED ORDER — PREDNISONE 20 MG PO TABS: ORAL_TABLET | ORAL | Status: DC

## 2014-03-12 NOTE — Progress Notes (Signed)
Patient ID: Terry Garner, male   DOB: Oct 02, 1940, 73 y.o.   MRN: [id number]  MEDICARE ANNUAL WELLNESS VISIT AND Quarterly OV  Assessment:   1. Hypertension  - TSH  2. Hyperlipidemia  - Lipid panel  3. PreDiabetes  - Hemoglobin A1c - Insulin, fasting  4. Vitamin D deficiency  - Vit D  25 hydroxy (rtn osteoporosis monitoring)  5. Gout, unspecified cause, unspecified chronicity, unspecified site   6. Carotid artery disease, unspecified laterality   7. 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 Abdominal aortic aneurysm   9. Herniated lumbar intervertebral disc   10. Encounter for long-term (current) use of other medications  - CBC with Differential - BASIC METABOLIC PANEL WITH GFR - Hepatic function panel - Magnesium  11. Routine general medical examination at a health care facility  12. Need for prophylactic vaccination against Streptococcus pneumoniae (pneumococcus)  - Pneumococcal conjugate vaccine 13-valent  13. Need for prophylactic vaccination and inoculation against influenza  - Flu vaccine HIGH DOSE PF (Fluzone Tri High dose)  14. Gouty arthropathy, unspecified  - Uric acid  Plan:   During the course of the visit the patient was educated and counseled about appropriate screening and preventive services including:    Pneumococcal vaccine   Influenza vaccine  Td vaccine  Screening electrocardiogram  Bone densitometry screening  Colorectal cancer screening  Diabetes screening  Glaucoma screening  Nutrition counseling   Advanced directives: requested  Screening recommendations, referrals: Vaccinations: Tdap vaccine 08/2012 Influenza vaccine HD 03/12/2014 Pneumococcal vaccine 06/25/2005 Shingles vaccine declined Hep B vaccine not indicated  Nutrition assessed and recommended  Colonoscopy 09/2006 Recommended yearly ophthalmology/optometry visit for glaucoma screening and  checkup Recommended yearly dental visit for hygiene and checkup Advanced directives - yes  Conditions/risks identified: BMI: Discussed weight loss, diet, and increase physical activity.  Increase physical activity: AHA recommends 150 minutes of physical activity a week.  Medications reviewed He is screened for PreDiabetes and he is at goal - ARB therapy: Yes. Urinary Incontinence is not an issue: discussed non pharmacology and pharmacology options.  Fall risk: low- discussed PT, home fall assessment, medications.    Subjective:  Terry Garner is a 73 y.o. MWM who presents for Medicare Annual Wellness Visit and complete physical.  Date of last medicare wellness visit is unknown.  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 His blood pressure has been controlled at home, today their BP is BP: 136/70 mmHg He does not workout. He denies chest pain, shortness of breath, dizziness.  He is on cholesterol medication and denies myalgias. His cholesterol is at goal. The cholesterol last visit was:   Lab Results  Component Value Date   CHOL 156 12/08/2013   HDL 42 12/08/2013   LDLCALC 88 12/08/2013   TRIG 128 12/08/2013   CHOLHDL 3.7 12/08/2013   He is screened for PreDiabetes. He has been working on diet and exercise for prediabetes, and denies foot ulcerations, hyperglycemia, hypoglycemia , nausea, paresthesia of the feet, polydipsia, polyuria and visual disturbances. Last A1C in the office was:  Lab Results  Component Value Date   HGBA1C 5.4 09/01/2013   Patient is on Vitamin D supplement.   Lab Results  Component Value Date   VD25OH 81 09/01/2013       Names of Other Physician/Practitioners you currently use: 1. [facility] here for primary care 2. Dr Dione Booze ,  eye doctor, last visit 2015 3. Has dentist, last visit July 2014   Patient Care Team: Lucky Cowboy, MD as PCP - General (Internal  Medicine) Eldred Manges, MD as Consulting Physician (Orthopedic Surgery) Petra Kuba, MD as Consulting Physician (Gastroenterology) Meryl Dare, MD as Consulting Physician (Gastroenterology) Runell Gess, MD as Consulting Physician (Cardiology) Ernesto Rutherford, MD as Consulting Physician (Ophthalmology)  Medication Review: Medication Sig  . allopurinol300 MG tab TAKE ONE TAB DAILY  . amLODipine  5 MG tab TAKE ONE TAB DAILY  . aspirin EC 81 MG tab Take 81 mg by mouth daily.  . clopidogrel (PLAVIX) 75 MG tab TAKE ONE TAB DAILY   . fenofibrate  134 MG cap Take 134 mg  daily t.  . ferrous sulfate 325 MG  Take 325 mg 2  times daily.  [name] latanoprost (XALATAN) 0.005 % opth soln Place 1 drop into both eyes daily.   [name] loperamide  2 MG cap Take 2 mg  as needed   . losartan  100 MG tab TAKE ONE TABE DAILY  . losartan-hctz 100-25  Take 1 tabdaily.   . metroNIDAZOLE 500 MG tab Take 1 tab 2 (two) times daily.  . pravastatin  40 MG tab TAKE ONE TAB AT BEDTIME    Current Problems (verified) Patient Active Problem List   Diagnosis Date Noted  . Abdominal aortic aneurysm 12/02/2013  . Encounter for long-term (current) use of other medications 09/01/2013  . Hypertension 07/17/2013  . PreDiabetes 07/17/2013  . Gout   . Vitamin D deficiency   . 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 Carotid artery disease 01/15/2013  . Right bundle branch block 01/15/2013  . Hyperlipidemia 01/15/2013  . Herniated lumbar intervertebral disc 06/06/2012   Screening Tests Health Maintenance  Topic Date Due  . Zostavax  11/01/1998  . DT 06/25/2012  . Influenza Vaccine  01/23/2014  . Colonoscopy  12/05/2018  . Pnvx  Completed   Immunization History  Administered Date(s) Administered  . Influenza Split 04/09/2013  . Pneumococcal-Unspecified Prevnar 06/2005 03/12/2014  . Td 06/25/2002   Preventative care: Last colonoscopy: 09/2006  Prior vaccinations: TD or Tdap:   Influenza: HD 02/2014  Pneumococcal:  06/2015 Shingles/Zostavax: declined  History reviewed: allergies, current medications, past family history, past medical history, past social history, past surgical history and problem list  Risk Factors: Tobacco History  Substance Use Topics  . Smoking status: Never Smoker   . Smokeless tobacco: Never Used  . Alcohol Use: No   He does not smoke.  Patient is not a former smoker. Are there smokers in your home (other than you)?  No  Alcohol Current alcohol use: none  Caffeine Current caffeine use: denies use  Exercise Current exercise: none  Nutrition/Diet Current diet: in general, a "healthy" diet    Cardiac risk factors: advanced age (older than 72 for men, 28 for women), dyslipidemia, hypertension, male gender, obesity (BMI >= 30 kg/m2) and sedentary lifestyle.  Depression Screen (Note: if answer to either of the following is "Yes", a more complete depression screening is indicated)   Q1: Over the past two weeks, have you felt down, depressed or hopeless? No  Q2: Over the past two weeks, have you felt little interest or pleasure in doing things? No  Have you lost interest or pleasure in daily life? No  Do you often feel hopeless? No  Do you cry easily over simple problems? No  Activities of Daily Living In your present state of health, do you  have any difficulty performing the following activities?:  Driving? No Managing money?  No Feeding yourself? No Getting from bed to chair? No Climbing a flight of stairs? No Preparing food and eating?: No Bathing or showering? No Getting dressed: No Getting to the toilet? No Using the toilet:No Moving around from place to place: No In the past year have you fallen or had a near fall?:No   Are you sexually active?  No  Do you have more than one partner?  No  Vision Difficulties: No  Hearing Difficulties: No Do you often ask people to speak up or repeat themselves? No Do you experience ringing or noises in your ears?  No Do you have difficulty understanding soft or whispered voices? No  Cognition  Do you feel that you have a problem with memory?No  Do you often misplace items? No  Do you feel safe at home?  Yes  Advanced directives Does patient have a Health Care Power of Attorney? Yes Does patient have a Living Will? Yes   Objective:     Blood pressure 136/70, pulse 50, temperature 97.8 F (36.6 C), temperature source Temporal, resp. rate 16, height 6' 1.75" (1.873 m), weight 209 lb (94.802 kg). Body mass index is 27.02 kg/(m^2).  General appearance: alert, no distress, WD/WN, male Cognitive Testing  Alert? Yes  Normal Appearance?Yes  Oriented to person? Yes  Place? Yes   Time? Yes  Recall of three objects?  Yes  Can perform simple calculations? Yes  Displays appropriate judgment?Yes  Can read the correct time from a watch face?Yes  HEENT: normocephalic, sclerae anicteric, TMs pearly, nares patent, no discharge or erythema, pharynx normal Oral cavity: MMM, no lesions Neck: supple, no lymphadenopathy, no thyromegaly, no masses Heart: RRR, normal S1, S2, no murmurs Lungs: CTA bilaterally, no wheezes, rhonchi, or rales Abdomen: +bs, soft, non tender, non distended, no masses, no hepatomegaly, no splenomegaly Musculoskeletal: nontender, no swelling, no obvious deformity Extremities: no edema, no cyanosis, no clubbing Pulses: 2+ symmetric, upper and lower extremities, normal cap refill Neurological: alert, oriented x 3, CN2-12 intact, strength normal upper extremities and lower extremities, sensation normal throughout, DTRs 2+ throughout, no cerebellar signs, gait normal Psychiatric: normal affect, behavior normal, pleasant   Medicare Attestation I have personally reviewed: The patient's medical and social history Their use of alcohol, tobacco or illicit drugs Their current medications and supplements The patient's functional ability including ADLs,fall risks, home safety risks,  cognitive, and hearing and visual impairment Diet and physical activities Evidence for depression or mood disorders  The patient's weight, height, BMI, and visual acuity have been recorded in the chart.  I have made referrals, counseling, and provided education to the patient based on review of the above and I have provided the patient with a written personalized care plan for preventive services.    Verlyn Dannenberg DAVID, MD   03/12/2014

## 2014-03-12 NOTE — Patient Instructions (Signed)

## 2014-03-13 LAB — INSULIN, FASTING: INSULIN FASTING, SERUM: 5.8 u[IU]/mL (ref 2.0–19.6)

## 2014-03-13 LAB — BASIC METABOLIC PANEL WITH GFR
BUN: 29 mg/dL — AB (ref 6–23)
CALCIUM: 9.2 mg/dL (ref 8.4–10.5)
CO2: 24 mEq/L (ref 19–32)
CREATININE: 2.33 mg/dL — AB (ref 0.50–1.35)
Chloride: 112 mEq/L (ref 96–112)
GFR, EST AFRICAN AMERICAN: 30 mL/min — AB
GFR, EST NON AFRICAN AMERICAN: 26 mL/min — AB
GLUCOSE: 94 mg/dL (ref 70–99)
Potassium: 5.3 mEq/L (ref 3.5–5.3)
Sodium: 142 mEq/L (ref 135–145)

## 2014-03-13 LAB — HEPATIC FUNCTION PANEL
ALBUMIN: 3.8 g/dL (ref 3.5–5.2)
ALT: 13 U/L (ref 0–53)
AST: 22 U/L (ref 0–37)
Alkaline Phosphatase: 30 U/L — ABNORMAL LOW (ref 39–117)
Bilirubin, Direct: 0.1 mg/dL (ref 0.0–0.3)
Indirect Bilirubin: 0.3 mg/dL (ref 0.2–1.2)
TOTAL PROTEIN: 5.7 g/dL — AB (ref 6.0–8.3)
Total Bilirubin: 0.4 mg/dL (ref 0.2–1.2)

## 2014-03-13 LAB — CBC WITH DIFFERENTIAL/PLATELET
BASOS ABS: 0 10*3/uL (ref 0.0–0.1)
BASOS PCT: 0 % (ref 0–1)
EOS ABS: 0.2 10*3/uL (ref 0.0–0.7)
Eosinophils Relative: 4 % (ref 0–5)
HEMATOCRIT: 32.3 % — AB (ref 39.0–52.0)
HEMOGLOBIN: 10.5 g/dL — AB (ref 13.0–17.0)
Lymphocytes Relative: 16 % (ref 12–46)
Lymphs Abs: 0.9 10*3/uL (ref 0.7–4.0)
MCH: 29.5 pg (ref 26.0–34.0)
MCHC: 32.5 g/dL (ref 30.0–36.0)
MCV: 90.7 fL (ref 78.0–100.0)
MONO ABS: 0.4 10*3/uL (ref 0.1–1.0)
MONOS PCT: 7 % (ref 3–12)
NEUTROS ABS: 4.3 10*3/uL (ref 1.7–7.7)
Neutrophils Relative %: 73 % (ref 43–77)
Platelets: 206 10*3/uL (ref 150–400)
RBC: 3.56 MIL/uL — ABNORMAL LOW (ref 4.22–5.81)
RDW: 15.4 % (ref 11.5–15.5)
WBC: 5.9 10*3/uL (ref 4.0–10.5)

## 2014-03-13 LAB — LIPID PANEL
CHOLESTEROL: 109 mg/dL (ref 0–200)
HDL: 32 mg/dL — ABNORMAL LOW (ref >39)
LDL Cholesterol: 51 mg/dL (ref 0–99)
TRIGLYCERIDES: 132 mg/dL (ref <150)
Total CHOL/HDL Ratio: 3.4 Ratio
VLDL: 26 mg/dL (ref 0–40)

## 2014-03-13 LAB — HEMOGLOBIN A1C
Hgb A1c MFr Bld: 5.6 % (ref <5.7)
MEAN PLASMA GLUCOSE: 114 mg/dL (ref <117)

## 2014-03-13 LAB — TSH: TSH: 2.624 u[IU]/mL (ref 0.350–4.500)

## 2014-03-13 LAB — MAGNESIUM: MAGNESIUM: 1.8 mg/dL (ref 1.5–2.5)

## 2014-03-13 LAB — VITAMIN D 25 HYDROXY (VIT D DEFICIENCY, FRACTURES): Vit D, 25-Hydroxy: 44 ng/mL (ref 30–89)

## 2014-03-13 LAB — URIC ACID: Uric Acid, Serum: 5.9 mg/dL (ref 4.0–7.8)

## 2014-04-07 ENCOUNTER — Ambulatory Visit: Payer: Medicare Other | Admitting: Gastroenterology

## 2014-04-26 ENCOUNTER — Other Ambulatory Visit: Payer: Self-pay | Admitting: Internal Medicine

## 2014-06-01 ENCOUNTER — Other Ambulatory Visit: Payer: Self-pay | Admitting: *Deleted

## 2014-06-01 MED ORDER — PRAVASTATIN SODIUM 40 MG PO TABS: ORAL_TABLET | ORAL | Status: DC

## 2014-06-01 MED ORDER — CLOPIDOGREL BISULFATE 75 MG PO TABS: ORAL_TABLET | ORAL | Status: DC

## 2014-06-02 ENCOUNTER — Other Ambulatory Visit: Payer: Self-pay | Admitting: *Deleted

## 2014-06-02 MED ORDER — LOSARTAN POTASSIUM 100 MG PO TABS: 100.0000 mg | ORAL_TABLET | Freq: Every day | ORAL | Status: DC

## 2014-06-07 ENCOUNTER — Other Ambulatory Visit: Payer: Self-pay | Admitting: *Deleted

## 2014-06-07 MED ORDER — LOSARTAN POTASSIUM 100 MG PO TABS: 100.0000 mg | ORAL_TABLET | Freq: Every day | ORAL | Status: DC

## 2014-07-08 ENCOUNTER — Encounter: Payer: Self-pay | Admitting: Physician Assistant

## 2014-07-08 ENCOUNTER — Ambulatory Visit (INDEPENDENT_AMBULATORY_CARE_PROVIDER_SITE_OTHER): Payer: Medicare Other | Admitting: Physician Assistant

## 2014-07-08 VITALS — BP 138/72 | HR 56 | Temp 98.1°F | Resp 16 | Wt 212.0 lb

## 2014-07-08 DIAGNOSIS — R7303 Prediabetes: Secondary | ICD-10-CM

## 2014-07-08 DIAGNOSIS — R1084 Generalized abdominal pain: Secondary | ICD-10-CM

## 2014-07-08 DIAGNOSIS — I10 Essential (primary) hypertension: Secondary | ICD-10-CM

## 2014-07-08 DIAGNOSIS — E785 Hyperlipidemia, unspecified: Secondary | ICD-10-CM

## 2014-07-08 DIAGNOSIS — R7309 Other abnormal glucose: Secondary | ICD-10-CM

## 2014-07-08 DIAGNOSIS — I779 Disorder of arteries and arterioles, unspecified: Secondary | ICD-10-CM

## 2014-07-08 DIAGNOSIS — I739 Peripheral vascular disease, unspecified: Secondary | ICD-10-CM

## 2014-07-08 DIAGNOSIS — D649 Anemia, unspecified: Secondary | ICD-10-CM

## 2014-07-08 DIAGNOSIS — E559 Vitamin D deficiency, unspecified: Secondary | ICD-10-CM

## 2014-07-08 DIAGNOSIS — Z79899 Other long term (current) drug therapy: Secondary | ICD-10-CM

## 2014-07-08 MED ORDER — DICYCLOMINE HCL 10 MG PO CAPS: 10.0000 mg | ORAL_CAPSULE | Freq: Four times a day (QID) | ORAL | Status: DC

## 2014-07-08 NOTE — Progress Notes (Signed)
Assessment and Plan:  Hypertension: Continue medication, monitor blood pressure at home. Continue DASH diet.  Reminder to go to the ER if any CP, SOB, nausea, dizziness, severe HA, changes vision/speech, left arm numbness and tingling, and jaw pain. Cholesterol: Continue diet and exercise. Check cholesterol.  Pre-diabetes-Continue diet and exercise. Check A1C Vitamin D Def- check level and continue medications.  Ab pain/cramping- extensive negative work up- add probiotic and dicyclomine, will try eliminating some medications, declines any other work up at this time and follow with Dr. Melford Aase for CPE in 3 months and would prefer to see him  Anemia- check labs  Continue diet and meds as discussed. Further disposition pending results of labs. Future Appointments Date Time Provider [place]  10/14/2014 11:00 AM [name], MD [facility] None  11/04/2014 8:45 AM [name], MD [facility] None   HPI 74 y.o. male  presents for 3 month follow up with hypertension, hyperlipidemia, prediabetes and vitamin D. His blood pressure has been controlled at home, today their BP is BP: 138/72 mmHg He does not workout. He denies chest pain, shortness of breath, dizziness. He has a history of CAD and AAA, he is on plavix and bASA He is on cholesterol medication, pravastatin 40 and fenofibrate and denies myalgias. His cholesterol is at goal less than 70. The cholesterol last visit was:   Lab Results  Component Value Date   CHOL 109 03/12/2014   HDL 32* 03/12/2014   LDLCALC 51 03/12/2014   TRIG 132 03/12/2014   CHOLHDL 3.4 03/12/2014  He has been working on diet and exercise for prediabetes, and denies paresthesia of the feet, polydipsia, polyuria and visual disturbances. Last A1C in the office was:  Lab Results  Component Value Date   HGBA1C 5.6 03/12/2014  Patient is on Vitamin D supplement.   Lab Results  Component Value Date   VD25OH 44 03/12/2014  Patient is on allopurinol for gout and  does not report a recent flare.  Continue to have diarrhea and abdominal pain. States every 2-3 hours will have BM, will start at 3 AM until 2 in the afternoon, he feels he is getting worse, having AB cramping in the morning, states has been black since on iron, not watery just frequent. He has seen Dr. Fuller Plan, and has had extensive evaluation with blood work, stool studies, abdominal/pelvic CT, colonoscopy, SBFT and upper endoscopy. His stool pathogen panel was noted for shigella and treated with cipro, has had 2 since that time that have been negative. He only takes immodium PRN, not daily and when he takes it it causes constipation, he has good appetite. He has had frequent urination/dribbling, last PSA 2.3.   Wt Readings from Last 3 Encounters:  07/08/14 212 lb (96.163 kg)  03/12/14 209 lb (94.802 kg)  03/03/14 207 lb (93.895 kg)    Current Medications:  Current Outpatient Prescriptions on File Prior to Visit  Medication Sig Dispense Refill  . allopurinol (ZYLOPRIM) 300 MG tablet TAKE ONE TABLET BY MOUTH ONCE DAILY 90 tablet 0  . aspirin EC 81 MG tablet Take 81 mg by mouth daily.    . clopidogrel (PLAVIX) 75 MG tablet TAKE ONE TABLET BY MOUTH ONCE DAILY FOR HEART STENTS 90 tablet 0  . fenofibrate micronized (LOFIBRA) 134 MG capsule TAKE ONE CAPSULE BY MOUTH ONCE DAILY 30 capsule 3  . ferrous sulfate 325 (65 FE) MG EC tablet Take 325 mg by mouth 2 (two) times daily.    [name] latanoprost (XALATAN) 0.005 %  ophthalmic solution Place 1 drop into both eyes daily.     [name] loperamide (IMODIUM) 2 MG capsule Take 2 mg by mouth as needed for diarrhea or loose stools.    [name] losartan (COZAAR) 100 MG tablet Take 1 tablet (100 mg total) by mouth daily. 90 tablet 0  . pravastatin (PRAVACHOL) 40 MG tablet TAKE ONE TABLET BY MOUTH AT BEDTIME FOR CHOLESTROL 90 tablet 0   No current facility-administered medications on file prior to visit.   Medical History:  Past Medical History  Diagnosis Date  . Hypertension    . Heart murmur   . Chronic kidney disease     CKD, right renal artery stenosis  . AAA (abdominal aortic aneurysm)     3.6 cm (01/09/12 ultrasound)  . Carotid artery disease   . Coronary artery disease     mild left internal carotid artery stenosis  . Arthritis   . Gout   . Vitamin D deficiency   . Tubular adenoma of colon 2003    Dr. Watt Climes  . Hyperlipidemia   . Allergy   . Glaucoma   . Cataract    Allergies:  Allergies  Allergen Reactions  . Toprol Xl [Metoprolol Tartrate] Other (See Comments)    Bradycardia with beta blockers  . Lipitor [Atorvastatin]     Myalgias   . Coumadin [Warfarin Sodium] Rash  . Tape Rash    Clear tape     Review of Systems:  Review of Systems  Constitutional: Negative.   HENT: Negative.   Eyes: Negative.   Respiratory: Negative.   Cardiovascular: Negative.   Gastrointestinal: Positive for heartburn, abdominal pain and diarrhea. Negative for nausea, vomiting, constipation, blood in stool and melena.  Genitourinary: Negative for dysuria, urgency, frequency, hematuria and flank pain.       + dribbling  Musculoskeletal: Positive for back pain. Negative for myalgias, joint pain, falls and neck pain.  Skin: Positive for itching (back). Negative for rash.  Neurological: Negative.   Psychiatric/Behavioral: Negative.     Family history- Review and unchanged Social history- Review and unchanged Physical Exam: BP 138/72 mmHg  Pulse 56  Temp(Src) 98.1 F (36.7 C)  Resp 16  Wt 212 lb (96.163 kg) Wt Readings from Last 3 Encounters:  07/08/14 212 lb (96.163 kg)  03/12/14 209 lb (94.802 kg)  03/03/14 207 lb (93.895 kg)   General Appearance: Well nourished, in no apparent distress. Eyes: PERRLA, EOMs, conjunctiva no swelling or erythema Sinuses: No Frontal/maxillary tenderness ENT/Mouth: Ext aud canals clear, TMs without erythema, bulging. No erythema, swelling, or exudate on post pharynx.  Tonsils not swollen or erythematous. Hearing normal.   Neck: Supple, thyroid normal.  Respiratory: Respiratory effort normal, BS equal bilaterally without rales, rhonchi, wheezing or stridor.  Cardio: RRR with no MRGs. Brisk peripheral pulses without edema.  Abdomen: Soft, + BS, mild right lower quadrant tenderness to palpation, no guarding, rebound, hernias, masses. Lymphatics: Non tender without lymphadenopathy.  Musculoskeletal: Full ROM, 5/5 strength, normal gait.  Skin: Warm, dry without rashes, lesions, ecchymosis.  Neuro: Cranial nerves intact. Normal muscle tone, no cerebellar symptoms. Sensation intact.  Psych: Awake and oriented X 3, normal affect, Insight and Judgment appropriate.    Vicie Mutters, PA-C 1:53 PM [facility]

## 2014-07-08 NOTE — Patient Instructions (Addendum)
Stop the iron for now.  Try the probiotic samples that I gave you once daily.   Bowel movements: 1) stop the allopurinol for 2-4 weeks- if this does not help start back on it at 1/2 a pill.  2) Stop the fenofibrate for 2-4 weeks, if this does not help, start back on it.  3) Cut the pravastatin in half, if this helps stop all together ofr 2-4 weeks and call the office.   Avoid mild products.  Will check prostate at physical.   Will call in a medication that you can take every night for you stomach with dinner.

## 2014-07-09 LAB — CBC WITH DIFFERENTIAL/PLATELET
Basophils Absolute: 0.1 10*3/uL (ref 0.0–0.1)
Basophils Relative: 1 % (ref 0–1)
EOS PCT: 3 % (ref 0–5)
Eosinophils Absolute: 0.2 10*3/uL (ref 0.0–0.7)
HCT: 32.2 % — ABNORMAL LOW (ref 39.0–52.0)
Hemoglobin: 10.6 g/dL — ABNORMAL LOW (ref 13.0–17.0)
Lymphocytes Relative: 14 % (ref 12–46)
Lymphs Abs: 0.8 10*3/uL (ref 0.7–4.0)
MCH: 30.1 pg (ref 26.0–34.0)
MCHC: 32.9 g/dL (ref 30.0–36.0)
MCV: 91.5 fL (ref 78.0–100.0)
MONOS PCT: 4 % (ref 3–12)
MPV: 9.9 fL (ref 8.6–12.4)
Monocytes Absolute: 0.2 10*3/uL (ref 0.1–1.0)
NEUTROS PCT: 78 % — AB (ref 43–77)
Neutro Abs: 4.2 10*3/uL (ref 1.7–7.7)
Platelets: 186 10*3/uL (ref 150–400)
RBC: 3.52 MIL/uL — ABNORMAL LOW (ref 4.22–5.81)
RDW: 14.7 % (ref 11.5–15.5)
WBC: 5.4 10*3/uL (ref 4.0–10.5)

## 2014-07-09 LAB — TSH: TSH: 1.427 u[IU]/mL (ref 0.350–4.500)

## 2014-07-09 LAB — BASIC METABOLIC PANEL WITH GFR
BUN: 47 mg/dL — ABNORMAL HIGH (ref 6–23)
CALCIUM: 9.3 mg/dL (ref 8.4–10.5)
CO2: 23 mEq/L (ref 19–32)
Chloride: 112 mEq/L (ref 96–112)
Creat: 2.73 mg/dL — ABNORMAL HIGH (ref 0.50–1.35)
GFR, Est African American: 25 mL/min — ABNORMAL LOW
GFR, Est Non African American: 22 mL/min — ABNORMAL LOW
Glucose, Bld: 131 mg/dL — ABNORMAL HIGH (ref 70–99)
Potassium: 5.5 mEq/L — ABNORMAL HIGH (ref 3.5–5.3)
SODIUM: 143 meq/L (ref 135–145)

## 2014-07-09 LAB — HEPATIC FUNCTION PANEL
ALK PHOS: 39 U/L (ref 39–117)
ALT: 13 U/L (ref 0–53)
AST: 22 U/L (ref 0–37)
Albumin: 3.7 g/dL (ref 3.5–5.2)
BILIRUBIN DIRECT: 0.1 mg/dL (ref 0.0–0.3)
BILIRUBIN INDIRECT: 0.3 mg/dL (ref 0.2–1.2)
BILIRUBIN TOTAL: 0.4 mg/dL (ref 0.2–1.2)
Total Protein: 5.9 g/dL — ABNORMAL LOW (ref 6.0–8.3)

## 2014-07-09 LAB — LIPID PANEL
CHOL/HDL RATIO: 3.9 ratio
Cholesterol: 137 mg/dL (ref 0–200)
HDL: 35 mg/dL — ABNORMAL LOW (ref >39)
LDL Cholesterol: 71 mg/dL (ref 0–99)
Triglycerides: 155 mg/dL — ABNORMAL HIGH (ref <150)
VLDL: 31 mg/dL (ref 0–40)

## 2014-07-09 LAB — HEMOGLOBIN A1C
Hgb A1c MFr Bld: 5.3 % (ref <5.7)
MEAN PLASMA GLUCOSE: 105 mg/dL (ref <117)

## 2014-07-09 LAB — IRON AND TIBC
%SAT: 27 % (ref 20–55)
Iron: 94 ug/dL (ref 42–165)
TIBC: 354 ug/dL (ref 215–435)
UIBC: 260 ug/dL (ref 125–400)

## 2014-07-09 LAB — FOLATE RBC: RBC Folate: 509 ng/mL (ref >280)

## 2014-07-09 LAB — FERRITIN: FERRITIN: 313 ng/mL (ref 22–322)

## 2014-07-09 LAB — MAGNESIUM: Magnesium: 2.1 mg/dL (ref 1.5–2.5)

## 2014-07-09 LAB — VITAMIN D 25 HYDROXY (VIT D DEFICIENCY, FRACTURES): Vit D, 25-Hydroxy: 27 ng/mL — ABNORMAL LOW (ref 30–100)

## 2014-08-06 ENCOUNTER — Other Ambulatory Visit: Payer: Self-pay | Admitting: Internal Medicine

## 2014-09-03 ENCOUNTER — Other Ambulatory Visit: Payer: Self-pay | Admitting: Internal Medicine

## 2014-09-06 ENCOUNTER — Encounter: Payer: Self-pay | Admitting: Internal Medicine

## 2014-10-06 ENCOUNTER — Other Ambulatory Visit: Payer: Self-pay | Admitting: Internal Medicine

## 2014-10-14 ENCOUNTER — Ambulatory Visit: Payer: Medicare Other | Admitting: Internal Medicine

## 2014-10-14 ENCOUNTER — Encounter: Payer: Self-pay | Admitting: Internal Medicine

## 2014-10-14 VITALS — BP 134/62 | HR 46 | Temp 98.0°F | Resp 16 | Ht 73.0 in | Wt 201.0 lb

## 2014-10-14 DIAGNOSIS — Z79899 Other long term (current) drug therapy: Secondary | ICD-10-CM

## 2014-10-14 DIAGNOSIS — I10 Essential (primary) hypertension: Secondary | ICD-10-CM

## 2014-10-14 DIAGNOSIS — IMO0001 Reserved for inherently not codable concepts without codable children: Secondary | ICD-10-CM

## 2014-10-14 DIAGNOSIS — Z125 Encounter for screening for malignant neoplasm of prostate: Secondary | ICD-10-CM

## 2014-10-14 DIAGNOSIS — R7303 Prediabetes: Secondary | ICD-10-CM

## 2014-10-14 DIAGNOSIS — I4510 Unspecified right bundle-branch block: Secondary | ICD-10-CM

## 2014-10-14 DIAGNOSIS — E559 Vitamin D deficiency, unspecified: Secondary | ICD-10-CM

## 2014-10-14 DIAGNOSIS — E785 Hyperlipidemia, unspecified: Secondary | ICD-10-CM

## 2014-10-14 LAB — BASIC METABOLIC PANEL WITH GFR
BUN: 39 mg/dL — AB (ref 6–23)
CHLORIDE: 114 meq/L — AB (ref 96–112)
CO2: 22 meq/L (ref 19–32)
CREATININE: 2.71 mg/dL — AB (ref 0.50–1.35)
Calcium: 9.6 mg/dL (ref 8.4–10.5)
GFR, EST AFRICAN AMERICAN: 25 mL/min — AB
GFR, EST NON AFRICAN AMERICAN: 22 mL/min — AB
GLUCOSE: 80 mg/dL (ref 70–99)
Potassium: 5.7 mEq/L — ABNORMAL HIGH (ref 3.5–5.3)
Sodium: 145 mEq/L (ref 135–145)

## 2014-10-14 LAB — MAGNESIUM: MAGNESIUM: 1.8 mg/dL (ref 1.5–2.5)

## 2014-10-14 LAB — CBC WITH DIFFERENTIAL/PLATELET
BASOS PCT: 1 % (ref 0–1)
Basophils Absolute: 0.1 10*3/uL (ref 0.0–0.1)
EOS PCT: 8 % — AB (ref 0–5)
Eosinophils Absolute: 0.4 10*3/uL (ref 0.0–0.7)
HEMATOCRIT: 32.3 % — AB (ref 39.0–52.0)
Hemoglobin: 10.8 g/dL — ABNORMAL LOW (ref 13.0–17.0)
LYMPHS PCT: 18 % (ref 12–46)
Lymphs Abs: 0.9 10*3/uL (ref 0.7–4.0)
MCH: 29.9 pg (ref 26.0–34.0)
MCHC: 33.4 g/dL (ref 30.0–36.0)
MCV: 89.5 fL (ref 78.0–100.0)
MONO ABS: 0.3 10*3/uL (ref 0.1–1.0)
MPV: 9.4 fL (ref 8.6–12.4)
Monocytes Relative: 6 % (ref 3–12)
NEUTROS PCT: 67 % (ref 43–77)
Neutro Abs: 3.4 10*3/uL (ref 1.7–7.7)
PLATELETS: 172 10*3/uL (ref 150–400)
RBC: 3.61 MIL/uL — ABNORMAL LOW (ref 4.22–5.81)
RDW: 14.4 % (ref 11.5–15.5)
WBC: 5 10*3/uL (ref 4.0–10.5)

## 2014-10-14 LAB — HEPATIC FUNCTION PANEL
ALBUMIN: 3.9 g/dL (ref 3.5–5.2)
ALT: 13 U/L (ref 0–53)
AST: 25 U/L (ref 0–37)
Alkaline Phosphatase: 38 U/L — ABNORMAL LOW (ref 39–117)
BILIRUBIN INDIRECT: 0.5 mg/dL (ref 0.2–1.2)
BILIRUBIN TOTAL: 0.7 mg/dL (ref 0.2–1.2)
Bilirubin, Direct: 0.2 mg/dL (ref 0.0–0.3)
TOTAL PROTEIN: 6.1 g/dL (ref 6.0–8.3)

## 2014-10-14 LAB — HEMOGLOBIN A1C
HEMOGLOBIN A1C: 4.8 % (ref <5.7)
Mean Plasma Glucose: 91 mg/dL (ref <117)

## 2014-10-14 LAB — LIPID PANEL
CHOL/HDL RATIO: 3.7 ratio
CHOLESTEROL: 139 mg/dL (ref 0–200)
HDL: 38 mg/dL — ABNORMAL LOW (ref >=40)
LDL Cholesterol: 79 mg/dL (ref 0–99)
Triglycerides: 111 mg/dL (ref <150)
VLDL: 22 mg/dL (ref 0–40)

## 2014-10-14 LAB — TSH: TSH: 1.385 u[IU]/mL (ref 0.350–4.500)

## 2014-10-14 NOTE — Addendum Note (Signed)
Addended by: Willow Reczek A on: 10/14/2014 02:28 PM   Modules accepted: Orders

## 2014-10-14 NOTE — Progress Notes (Signed)
     L  E  F  T B  E  F  O  R  E  S  E  E  N 

## 2014-10-15 LAB — URINALYSIS, MICROSCOPIC ONLY
Bacteria, UA: NONE SEEN
CASTS: NONE SEEN
Crystals: NONE SEEN
Squamous Epithelial / LPF: NONE SEEN

## 2014-10-15 LAB — INSULIN, RANDOM: INSULIN: 4.6 u[IU]/mL (ref 2.0–19.6)

## 2014-10-15 LAB — VITAMIN D 25 HYDROXY (VIT D DEFICIENCY, FRACTURES): Vit D, 25-Hydroxy: 44 ng/mL (ref 30–100)

## 2014-10-15 LAB — PSA: PSA: 3 ng/mL (ref <=4.00)

## 2014-10-15 LAB — MICROALBUMIN / CREATININE URINE RATIO
Creatinine, Urine: 128.3 mg/dL
Microalb Creat Ratio: 21.8 mg/g (ref 0.0–30.0)
Microalb, Ur: 2.8 mg/dL — ABNORMAL HIGH (ref <2.0)

## 2014-10-18 ENCOUNTER — Telehealth: Payer: Self-pay | Admitting: *Deleted

## 2014-10-18 NOTE — Telephone Encounter (Signed)
Pt aware of lab results patient will reschedule OV to finish CPE.  Patient left due taking his wife for a doctor appointment.

## 2014-10-18 NOTE — Telephone Encounter (Signed)
-----   Message from [name], MD sent at 10/16/2014  2:24 PM EDT ----- Labs all look OK - Chol great  Vit D a little low - add 4,000 to 5,000 units more/daily Kidney tests suggest need to drink more water

## 2014-10-19 ENCOUNTER — Ambulatory Visit (INDEPENDENT_AMBULATORY_CARE_PROVIDER_SITE_OTHER): Payer: Medicare Other | Admitting: Internal Medicine

## 2014-10-19 ENCOUNTER — Telehealth: Payer: Self-pay

## 2014-10-19 ENCOUNTER — Encounter: Payer: Self-pay | Admitting: Internal Medicine

## 2014-10-19 VITALS — BP 128/72 | HR 56 | Temp 97.5°F | Resp 16 | Ht 73.0 in | Wt 204.6 lb

## 2014-10-19 DIAGNOSIS — I714 Abdominal aortic aneurysm, without rupture, unspecified: Secondary | ICD-10-CM

## 2014-10-19 DIAGNOSIS — R7309 Other abnormal glucose: Secondary | ICD-10-CM

## 2014-10-19 DIAGNOSIS — I10 Essential (primary) hypertension: Secondary | ICD-10-CM

## 2014-10-19 DIAGNOSIS — I25709 Atherosclerosis of coronary artery bypass graft(s), unspecified, with unspecified angina pectoris: Secondary | ICD-10-CM

## 2014-10-19 DIAGNOSIS — E559 Vitamin D deficiency, unspecified: Secondary | ICD-10-CM

## 2014-10-19 DIAGNOSIS — M109 Gout, unspecified: Secondary | ICD-10-CM

## 2014-10-19 DIAGNOSIS — E785 Hyperlipidemia, unspecified: Secondary | ICD-10-CM

## 2014-10-19 DIAGNOSIS — R7303 Prediabetes: Secondary | ICD-10-CM

## 2014-10-19 NOTE — Patient Instructions (Signed)
++++++++++++++++++++++++++++++++++  Recommend Low dose or baby Aspirin 81 mg daily   To reduce risk of Colon Cancer 20 %, Skin Cancer 26 % , Melanoma 46% and   Pancreatic cancer 60%  +++++++++++++++++++++++++++++++++ Vitamin D goal is between 70-100.   Please make sure that you are taking your Vitamin D as directed.   It is very important as a natural antiinflammatory   helping hair, skin, and nails, as well as reducing stroke and heart attack risk.   It helps your bones and helps with mood.  It also decreases numerous cancer risks so please take it as directed.   Low Vit D is associated with a 200-300% higher risk for CANCER   and 200-300% higher risk for HEART   ATTACK  &  STROKE.    ...................................................................................  It is also associated with higher death rate at younger ages,   autoimmune diseases like Rheumatoid arthritis, Lupus, Multiple Sclerosis.     Also many other serious conditions, like depression, Alzheimer's  Dementia, infertility, muscle aches, fatigue, fibromyalgia - just to name a few.  ++++++++++++++++++++++++++++++++++++++++ Recommend the book "The END of DIETING" by Dr Joel Fuhrman   & the book "The END of DIABETES " by Dr Joel Fuhrman  At [employer] - get book & Audio CD's     Being diabetic has a  300% increased risk for heart attack, stroke, cancer, and alzheimer- type vascular dementia. It is very important that you work harder with diet by avoiding all foods that are white. Avoid white rice (brown & wild rice is OK), white potatoes (sweetpotatoes in moderation is OK), White bread or wheat bread or anything made out of white flour like bagels, donuts, rolls, buns, biscuits, cakes, pastries, cookies, pizza crust, and pasta (made from white flour & egg whites) - vegetarian pasta or spinach or wheat pasta is OK. Multigrain breads like Arnold's or [employer], or multigrain sandwich thins or  flatbreads.  Diet, exercise and weight loss can reverse and cure diabetes in the early stages.  Diet, exercise and weight loss is very important in the control and prevention of complications of diabetes which affects every system in your body, ie. Brain - dementia/stroke, eyes - glaucoma/blindness, heart - heart attack/heart failure, kidneys - dialysis, stomach - gastric paralysis, intestines - malabsorption, nerves - severe painful neuritis, circulation - gangrene & loss of a leg(s), and finally cancer and Alzheimers.    I recommend avoid fried & greasy foods,  sweets/candy, white rice (brown or wild rice or Quinoa is OK), white potatoes (sweet potatoes are OK) - anything made from white flour - bagels, doughnuts, rolls, buns, biscuits,white and wheat breads, pizza crust and traditional pasta made of white flour & egg white(vegetarian pasta or spinach or wheat pasta is OK).  Multi-grain bread is OK - like multi-grain flat bread or sandwich thins. Avoid alcohol in excess. Exercise is also important.    Eat all the vegetables you want - avoid meat, especially red meat and dairy - especially cheese.  Cheese is the most concentrated form of trans-fats which is the worst thing to clog up our arteries. Veggie cheese is OK which can be found in the fresh produce section at [employer] or [employer] or [employer]  ++++++++++++++++++++++++++++++++++++++++++++++++++++++++ Preventive Care for Adults A healthy lifestyle and preventive care can promote health and wellness. Preventive health guidelines for men include the following key practices:  A routine yearly physical is a good way to check with your health care provider about your   health and preventative screening. It is a chance to share any concerns and updates on your health and to receive a thorough exam.  Visit your dentist for a routine exam and preventative care every 6 months. Brush your teeth twice a day and floss once a day. Good oral hygiene  prevents tooth decay and gum disease.  The frequency of eye exams is based on your age, health, family medical history, use of contact lenses, and other factors. Follow your health care provider's recommendations for frequency of eye exams.  Eat a healthy diet. Foods such as vegetables, fruits, whole grains, low-fat dairy products, and lean protein foods contain the nutrients you need without too many calories. Decrease your intake of foods high in solid fats, added sugars, and salt. Eat the right amount of calories for you.Get information about a proper diet from your health care provider, if necessary.  Regular physical exercise is one of the most important things you can do for your health. Most adults should get at least 150 minutes of moderate-intensity exercise (any activity that increases your heart rate and causes you to sweat) each week. In addition, most adults need muscle-strengthening exercises on 2 or more days a week.  Maintain a healthy weight. The body mass index (BMI) is a screening tool to identify possible weight problems. It provides an estimate of body fat based on height and weight. Your health care provider can find your BMI and can help you achieve or maintain a healthy weight.For adults 20 years and older:  A BMI below 18.5 is considered underweight.  A BMI of 18.5 to 24.9 is normal.  A BMI of 25 to 29.9 is considered overweight.  A BMI of 30 and above is considered obese.  Maintain normal blood lipids and cholesterol levels by exercising and minimizing your intake of saturated fat. Eat a balanced diet with plenty of fruit and vegetables. Blood tests for lipids and cholesterol should begin at age 20 and be repeated every 5 years. If your lipid or cholesterol levels are high, you are over 50, or you are at high risk for heart disease, you may need your cholesterol levels checked more frequently.Ongoing high lipid and cholesterol levels should be treated with medicines if  diet and exercise are not working.  If you smoke, find out from your health care provider how to quit. If you do not use tobacco, do not start.  Lung cancer screening is recommended for adults aged 55-80 years who are at high risk for developing lung cancer because of a history of smoking. A yearly low-dose CT scan of the lungs is recommended for people who have at least a 30-pack-year history of smoking and are a current smoker or have quit within the past 15 years. A pack year of smoking is smoking an average of 1 pack of cigarettes a day for 1 year (for example: 1 pack a day for 30 years or 2 packs a day for 15 years). Yearly screening should continue until the smoker has stopped smoking for at least 15 years. Yearly screening should be stopped for people who develop a health problem that would prevent them from having lung cancer treatment.  If you choose to drink alcohol, do not have more than 2 drinks per day. One drink is considered to be 12 ounces (355 mL) of beer, 5 ounces (148 mL) of wine, or 1.5 ounces (44 mL) of liquor.  Avoid use of street drugs. Do not share needles with anyone. Ask   for help if you need support or instructions about stopping the use of drugs.  High blood pressure causes heart disease and increases the risk of stroke. Your blood pressure should be checked at least every 1-2 years. Ongoing high blood pressure should be treated with medicines, if weight loss and exercise are not effective.  If you are 45-79 years old, ask your health care provider if you should take aspirin to prevent heart disease.  Diabetes screening involves taking a blood sample to check your fasting blood sugar level. Testing should be considered at a younger age or be carried out more frequently if you are overweight and have at least 1 risk factor for diabetes.  Colorectal cancer can be detected and often prevented. Most routine colorectal cancer screening begins at the age of 50 and continues  through age 75. However, your health care provider may recommend screening at an earlier age if you have risk factors for colon cancer. On a yearly basis, your health care provider may provide home test kits to check for hidden blood in the stool. Use of a small camera at the end of a tube to directly examine the colon (sigmoidoscopy or colonoscopy) can detect the earliest forms of colorectal cancer. Talk to your health care provider about this at age 50, when routine screening begins. Direct exam of the colon should be repeated every 5-10 years through age 75, unless early forms of precancerous polyps or small growths are found.  Hepatitis C blood testing is recommended for all people born from 1945 through 1965 and any individual with known risks for hepatitis C.  Screening for abdominal aortic aneurysm (AAA)  by ultrasound is recommended for people who have history of high blood pressure or who are current or former smokers.  Healthy men should  receive prostate-specific antigen (PSA) blood tests as part of routine cancer screening. Talk with your health care provider about prostate cancer screening.  Testicular cancer screening is  recommended for adult males. Screening includes self-exam, a health care provider exam, and other screening tests. Consult with your health care provider about any symptoms you have or any concerns you have about testicular cancer.  Use sunscreen. Apply sunscreen liberally and repeatedly throughout the day. You should seek shade when your shadow is shorter than you. Protect yourself by wearing long sleeves, pants, a wide-brimmed hat, and sunglasses year round, whenever you are outdoors.  Once a month, do a whole-body skin exam, using a mirror to look at the skin on your back. Tell your health care provider about new moles, moles that have irregular borders, moles that are larger than a pencil eraser, or moles that have changed in shape or color.  Stay current with  required vaccines (immunizations).  Influenza vaccine. All adults should be immunized every year.  Tetanus, diphtheria, and acellular pertussis (Td, Tdap) vaccine. An adult who has not previously received Tdap or who does not know his vaccine status should receive 1 dose of Tdap. This initial dose should be followed by tetanus and diphtheria toxoids (Td) booster doses every 10 years. Adults with an unknown or incomplete history of completing a 3-dose immunization series with Td-containing vaccines should begin or complete a primary immunization series including a Tdap dose. Adults should receive a Td booster every 10 years.  Zoster vaccine. One dose is recommended for adults aged 60 years or older unless certain conditions are present.    PREVNAR - Pneumococcal 13-valent conjugate (PCV13) vaccine. When indicated, a person who is   uncertain of his immunization history and has no record of immunization should receive the PCV13 vaccine. An adult aged 19 years or older who has certain medical conditions and has not been previously immunized should receive 1 dose of PCV13 vaccine. This PCV13 should be followed with a dose of pneumococcal polysaccharide (PPSV23) vaccine. The PPSV23 vaccine dose should be obtained at least 8 weeks after the dose of PCV13 vaccine. An adult aged 19 years or older who has certain medical conditions and previously received 1 or more doses of PPSV23 vaccine should receive 1 dose of PCV13. The PCV13 vaccine dose should be obtained 1 or more years after the last PPSV23 vaccine dose.    PNEUMOVAX - Pneumococcal polysaccharide (PPSV23) vaccine. When PCV13 is also indicated, PCV13 should be obtained first. All adults aged 65 years and older should be immunized. An adult younger than age 65 years who has certain medical conditions should be immunized. Any person who resides in a nursing home or long-term care facility should be immunized. An adult smoker should be immunized. People with  an immunocompromised condition and certain other conditions should receive both PCV13 and PPSV23 vaccines. People with human immunodeficiency virus (HIV) infection should be immunized as soon as possible after diagnosis. Immunization during chemotherapy or radiation therapy should be avoided. Routine use of PPSV23 vaccine is not recommended for American Indians, [place] Natives, or people younger than 65 years unless there are medical conditions that require PPSV23 vaccine. When indicated, people who have unknown immunization and have no record of immunization should receive PPSV23 vaccine. One-time revaccination 5 years after the first dose of PPSV23 is recommended for people aged 19-64 years who have chronic kidney failure, nephrotic syndrome, asplenia, or immunocompromised conditions. People who received 1-2 doses of PPSV23 before age 65 years should receive another dose of PPSV23 vaccine at age 65 years or later if at least 5 years have passed since the previous dose. Doses of PPSV23 are not needed for people immunized with PPSV23 at or after age 65 years.    Hepatitis A vaccine. Adults who wish to be protected from this disease, have certain high-risk conditions, work with hepatitis A-infected animals, work in hepatitis A research labs, or travel to or work in countries with a high rate of hepatitis A should be immunized. Adults who were previously unvaccinated and who anticipate close contact with an international adoptee during the first 60 days after arrival in the [place] from a country with a high rate of hepatitis A should be immunized.    Hepatitis B vaccine. Adults should be immunized if they wish to be protected from this disease, have certain high-risk conditions, may be exposed to blood or other infectious body fluids, are household contacts or sex partners of hepatitis B positive people, are clients or workers in certain care facilities, or travel to or work in countries with a high  rate of hepatitis B.   Preventive Service / Frequency   Ages 65 and over  Blood pressure check.  Lipid and cholesterol check.  Lung cancer screening. / Every year if you are aged 55-80 years and have a 30-pack-year history of smoking and currently smoke or have quit within the past 15 years. Yearly screening is stopped once you have quit smoking for at least 15 years or develop a health problem that would prevent you from having lung cancer treatment.  Fecal occult blood test (FOBT) of stool. You may not have to do this test if you get a   colonoscopy every 10 years.  Flexible sigmoidoscopy** or colonoscopy.** / Every 5 years for a flexible sigmoidoscopy or every 10 years for a colonoscopy beginning at age 50 and continuing until age 75.  Hepatitis C blood test.** / For all people born from 1945 through 1965 and any individual with known risks for hepatitis C.  Abdominal aortic aneurysm (AAA) screening./ Screening current or former smokers or have Hypertension.  Skin self-exam. / Monthly.  Influenza vaccine. / Every year.  Tetanus, diphtheria, and acellular pertussis (Tdap/Td) vaccine.** / 1 dose of Td every 10 years.   Zoster vaccine.** / 1 dose for adults aged 60 years or older.         Pneumococcal 13-valent conjugate (PCV13) vaccine.    Pneumococcal polysaccharide (PPSV23) vaccine.     Hepatitis A vaccine.** / Consult your health care provider.  Hepatitis B vaccine.** / Consult your health care provider. Screening for abdominal aortic aneurysm (AAA)  by ultrasound is recommended for people who have history of high blood pressure or who are current or former smokers. 

## 2014-10-19 NOTE — Telephone Encounter (Signed)
Mailed Hemoccult cards to patient. Patient is advised to return hemoccults when complete.

## 2014-10-19 NOTE — Progress Notes (Signed)
Patient ID: Terry Garner, male   DOB: 1941/03/08, 74 y.o.   MRN: [id number]  Annual Comprehensive Examination  This very nice 74 y.o. MWM presents for complete physical.  Patient has been followed for HTN, ASCAD,  Prediabetes, Hyperlipidemia, and Vitamin D Deficiency.   HTN predates since 1998. In 2001 and 2008 he had PTCA/Stentsand in 2010 cath showed patent stents.  Patient's BP has been controlled at home.Today's BP: 128/72 mmHg. Patient denies any cardiac symptoms as chest pain, palpitations, shortness of breath, dizziness or ankle swelling. Abd u/s showed a 3.6 cm AAA in 2012.    Patient's hyperlipidemia is controlled with diet and medications. Patient denies myalgias or other medication SE's. Last lipids were at goal - Total Chol 139; HDL 38; LDL; Trig 111 on Apr 2016.    The patient is screened for prediabetes and patient denies reactive hypoglycemic symptoms, visual blurring, diabetic polys or paresthesias. Last A1c was  4.8% on 10/14/2014.       Finally, patient has history of Vitamin D Deficiency of 27 in 2008  and last vitamin D was 44 on 10/14/2014.  Medication Sig  . aspirin EC 81 MG tablet Take 81 mg by mouth daily.  [name] VITAMIN D 5000 UNITS TABS Take 5,000 Units by mouth daily.  . clopidogrel (PLAVIX) 75 MG tablet TAKE ONE TAB DAILY FOR HEART  STENTS.  . fenofibrate  134 MG cap TAKE ONE CAPSULE BY MOUTH ONCE DAILY  . latanoprost (XALATAN) 0.005 % ophthalmic solution Place 1 drop into both eyes daily.   [name] losartan  100 MG tablet Take 1 tablet (100 mg total) by mouth daily.  . pravastatin  40 MG tablet TAKE ONE TABLET BY MOUTH AT BEDTIME FOR CHOLESTROL   Allergies  Allergen Reactions  . Toprol Xl [Metoprolol Tartrate] Other (See Comments)    Bradycardia with beta blockers  . Lipitor [Atorvastatin]     Myalgias   . Coumadin [Warfarin Sodium] Rash  . Tape Rash    Clear tape   Past Medical History  Diagnosis Date  . Hypertension   . Heart murmur   . Chronic kidney disease      CKD, right renal artery stenosis  . AAA (abdominal aortic aneurysm)     3.6 cm (01/09/12 ultrasound)  . Carotid artery disease   . Coronary artery disease     mild left internal carotid artery stenosis  . Arthritis   . Gout   . Vitamin D deficiency   . Tubular adenoma of colon 2003    Dr. Watt Climes  . Hyperlipidemia   . Allergy   . Glaucoma   . Cataract    Health Maintenance  Topic Date Due  . ZOSTAVAX  11/01/1998  . INFLUENZA VACCINE  01/24/2015  . COLONOSCOPY  12/05/2018  . TETANUS/TDAP  08/28/2022  . PNA vac Low Risk Adult  Completed   Immunization History  Administered Date(s) Administered  . Influenza Split 04/09/2013  . Influenza, High Dose Seasonal PF 03/12/2014  . Pneumococcal Conjugate-13 03/12/2014  . Pneumococcal-Unspecified 06/25/2005  . Td 06/25/2002   Past Surgical History  Procedure Laterality Date  . Cardiac catheterization      stents  last -07  . Hernia repair Right 28  . Total hip arthroplasty Left   . Shoulder arthroscopy Right   . Cervical disc surgery    . Cardiac stents    . Lumbar laminectomy  06/06/2012    Procedure: MICRODISCECTOMY LUMBAR LAMINECTOMY;  Surgeon: Marybelle Killings, MD;  Location:  [facility];  Service: Orthopedics;  Laterality: Left;  Left L4-5 Microdiscectomy  . Back surgery    . Eye surgery Left 06    retinal detachment   Family History  Problem Relation Age of Onset  . Colon cancer Neg Hx   . Esophageal cancer Neg Hx   . Stomach cancer Neg Hx   . Rectal cancer Neg Hx   . Other Brother     laryngeal cancer  . Diabetes Brother   . Heart disease Mother   . Heart disease Father    History   Social History  . Marital Status: Married    Spouse Name: N/A  . Number of Children: N/A  . Years of Education: N/A   Occupational History  . Retired Armed forces training and education officer   Social History Main Topics  . Smoking status: Never Smoker   . Smokeless tobacco: Never Used  . Alcohol Use: No  . Drug Use: No  . Sexual Activity: Not  on file    ROS Constitutional: Denies fever, chills, weight loss/gain, headaches, insomnia,  night sweats or change in appetite. Does c/o fatigue. Eyes: Denies redness, blurred vision, diplopia, discharge, itchy or watery eyes.  ENT: Denies discharge, congestion, post nasal drip, epistaxis, sore throat, earache, hearing loss, dental pain, Tinnitus, Vertigo, Sinus pain or snoring.  Cardio: Denies chest pain, palpitations, irregular heartbeat, syncope, dyspnea, diaphoresis, orthopnea, PND, claudication or edema Respiratory: denies cough, dyspnea, DOE, pleurisy, hoarseness, laryngitis or wheezing.  Gastrointestinal: Denies dysphagia, heartburn, reflux, water brash, pain, cramps, nausea, vomiting, bloating, diarrhea, constipation, hematemesis, melena, hematochezia, jaundice or hemorrhoids Genitourinary: Denies dysuria, frequency, urgency, nocturia, hesitancy, discharge, hematuria or flank pain Musculoskeletal: Denies arthralgia, myalgia, stiffness, Jt. Swelling, pain, limp or strain/sprain. Denies Falls. Skin: Denies puritis, rash, hives, warts, acne, eczema or change in skin lesion Neuro: No weakness, tremor, incoordination, spasms, paresthesia or pain Psychiatric: Denies confusion, memory loss or sensory loss. Denies Depression. Endocrine: Denies change in weight, skin, hair change, nocturia, and paresthesia, diabetic polys, visual blurring or hyper / hypo glycemic episodes.  Heme/Lymph: No excessive bleeding, bruising or enlarged lymph nodes.  Physical Exam  BP 128/72   Pulse 56  Temp 97.5 F   Resp 16  Ht 6\' 1"    Wt 204 lb 9.6 oz    BMI 27.00   General Appearance: Well nourished, in no apparent distress. Eyes: PERRLA, EOMs, conjunctiva no swelling or erythema, normal fundi and vessels. Sinuses: No frontal/maxillary tenderness ENT/Mouth: EACs patent / TMs  nl. Nares clear without erythema, swelling, mucoid exudates. Oral hygiene is good. No erythema, swelling, or exudate. Tongue normal,  non-obstructing. Tonsils not swollen or erythematous. Hearing normal.  Neck: Supple, thyroid normal. No bruits, nodes or JVD. Respiratory: Respiratory effort normal.  BS equal and clear bilateral without rales, rhonci, wheezing or stridor. Cardio: Heart sounds are normal with regular rate and rhythm and no murmurs, rubs or gallops. Peripheral pulses are normal and equal bilaterally without edema. No aortic or femoral bruits. Chest: symmetric with normal excursions and percussion.  Abdomen: Flat, soft, with bowl sounds. Nontender, no guarding, rebound, hernias, masses, or organomegaly.  Lymphatics: Non tender without lymphadenopathy.  Genitourinary: No hernias.Testes nl. DRE - prostate nl for age - smooth & firm w/o nodules. Musculoskeletal: Full ROM all peripheral extremities, joint stability, 5/5 strength, and normal gait. Skin: Warm and dry without rashes, lesions, cyanosis, clubbing or  ecchymosis.  Neuro: Cranial nerves intact, reflexes equal bilaterally. Normal muscle tone, no cerebellar symptoms. Sensation intact.  Pysch: Awake  and oriented X 3 with normal affect, insight and judgment appropriate.   Assessment and Plan  1. Essential hypertension   2. Hyperlipidemia   3. Prediabetes   4. Vitamin D deficiency   5. Coronary artery disease involving coronary bypass graft with unspecified angina pectoris   6. Abdominal aortic aneurysm  - [place], RETROPERITNL ABD,  LTD  7. Gout, hx   Continue prudent diet as discussed, weight control, BP monitoring, regular exercise, and medications as discussed.  Discussed med effects and SE's. Routine screening labs were done prior to office visit and reviewed with patient during the visit.  Over 40 minutes of exam, counseling &  chart review was performed.

## 2014-10-20 ENCOUNTER — Encounter: Payer: Self-pay | Admitting: Internal Medicine

## 2014-10-21 ENCOUNTER — Telehealth (HOSPITAL_COMMUNITY): Payer: Self-pay | Admitting: *Deleted

## 2014-10-22 ENCOUNTER — Other Ambulatory Visit: Payer: Self-pay | Admitting: Cardiovascular Disease

## 2014-10-22 DIAGNOSIS — I714 Abdominal aortic aneurysm, without rupture, unspecified: Secondary | ICD-10-CM

## 2014-10-28 ENCOUNTER — Other Ambulatory Visit: Payer: Self-pay | Admitting: *Deleted

## 2014-10-28 DIAGNOSIS — Z1212 Encounter for screening for malignant neoplasm of rectum: Secondary | ICD-10-CM

## 2014-10-28 LAB — POC HEMOCCULT BLD/STL (HOME/3-CARD/SCREEN)
Card #3 Fecal Occult Blood, POC: NEGATIVE
FECAL OCCULT BLD: NEGATIVE
FECAL OCCULT BLD: NEGATIVE

## 2014-10-31 IMAGING — CR DG LUMBAR SPINE 2-3V
2 series · 2 of 2 positions shown · non-contrast
Comparison: MRI 03/08/2012

CLINICAL DATA: L4-5 discectomy.

LUMBAR SPINE - 2-3 VIEW

[lateral (1 of 2)]
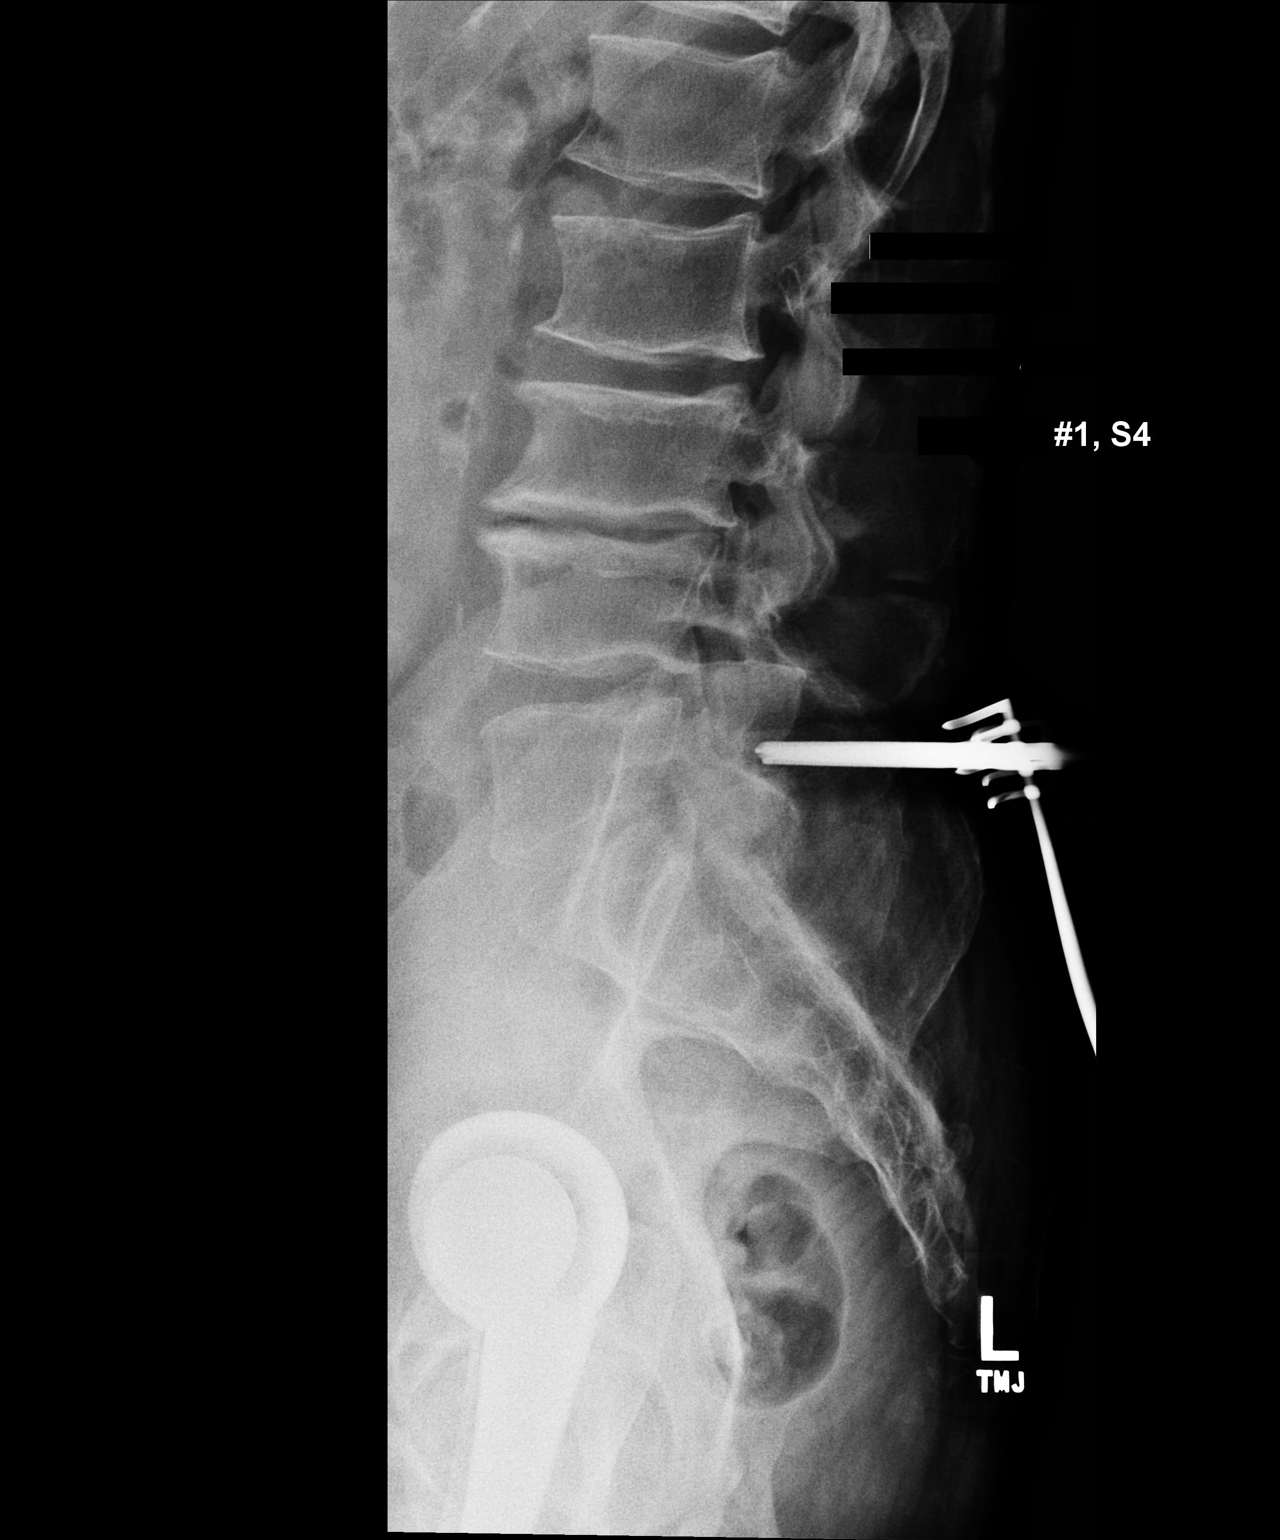

[lateral (2 of 2)]
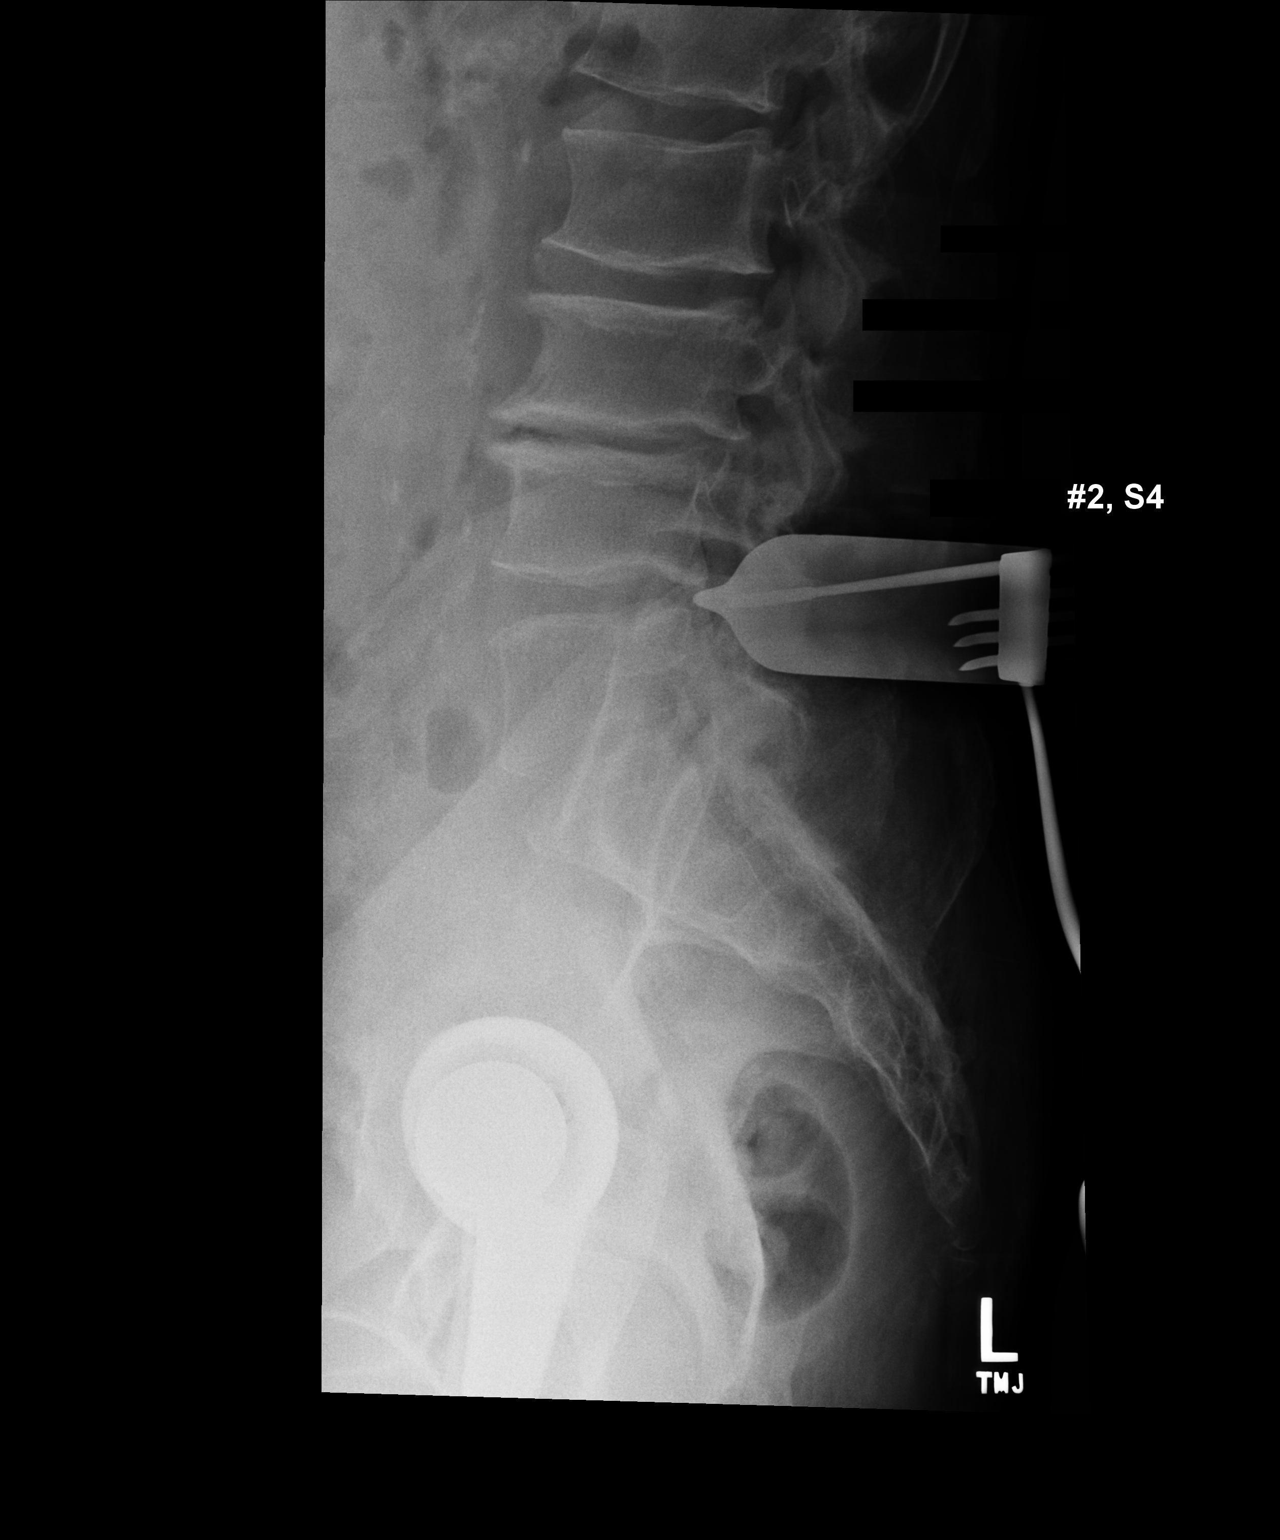

[2 of 2 positions shown; findings below may reference images not displayed]

FINDINGS: Film labeled #1 demonstrates posterior surgical
instruments directed at the L5 vertebral body.

Film labeled #2 demonstrates posterior surgical instruments
directed at the L4-5 level.
IMPRESSION: Intraoperative localization as above.

## 2014-11-04 ENCOUNTER — Ambulatory Visit (INDEPENDENT_AMBULATORY_CARE_PROVIDER_SITE_OTHER): Payer: Medicare Other | Admitting: Ophthalmology

## 2014-11-04 DIAGNOSIS — H34812 Central retinal vein occlusion, left eye: Secondary | ICD-10-CM | POA: Diagnosis not present

## 2014-11-04 DIAGNOSIS — H35033 Hypertensive retinopathy, bilateral: Secondary | ICD-10-CM | POA: Diagnosis not present

## 2014-11-04 DIAGNOSIS — H2511 Age-related nuclear cataract, right eye: Secondary | ICD-10-CM

## 2014-11-04 DIAGNOSIS — H35371 Puckering of macula, right eye: Secondary | ICD-10-CM

## 2014-11-04 DIAGNOSIS — H43813 Vitreous degeneration, bilateral: Secondary | ICD-10-CM | POA: Diagnosis not present

## 2014-11-04 DIAGNOSIS — I10 Essential (primary) hypertension: Secondary | ICD-10-CM

## 2014-11-15 ENCOUNTER — Emergency Department (HOSPITAL_COMMUNITY)
Admission: EM | Admit: 2014-11-15 | Discharge: 2014-11-16 | Disposition: A | Discharge status: Home / Self Care | Payer: Medicare Other | Attending: Emergency Medicine | Admitting: Emergency Medicine

## 2014-11-15 ENCOUNTER — Ambulatory Visit (HOSPITAL_COMMUNITY)
Admission: RE | Admit: 2014-11-15 | Discharge: 2014-11-15 | Disposition: A | Discharge status: Home / Self Care | Payer: Medicare Other | Source: Ambulatory Visit | Attending: Internal Medicine | Admitting: Internal Medicine

## 2014-11-15 ENCOUNTER — Encounter (HOSPITAL_COMMUNITY): Payer: Self-pay | Admitting: Emergency Medicine

## 2014-11-15 DIAGNOSIS — K529 Noninfective gastroenteritis and colitis, unspecified: Secondary | ICD-10-CM | POA: Diagnosis not present

## 2014-11-15 DIAGNOSIS — Z79899 Other long term (current) drug therapy: Secondary | ICD-10-CM | POA: Insufficient documentation

## 2014-11-15 DIAGNOSIS — I2510 Atherosclerotic heart disease of native coronary artery without angina pectoris: Secondary | ICD-10-CM | POA: Diagnosis not present

## 2014-11-15 DIAGNOSIS — E559 Vitamin D deficiency, unspecified: Secondary | ICD-10-CM | POA: Diagnosis not present

## 2014-11-15 DIAGNOSIS — N183 Chronic kidney disease, stage 3 unspecified: Secondary | ICD-10-CM

## 2014-11-15 DIAGNOSIS — Z7982 Long term (current) use of aspirin: Secondary | ICD-10-CM | POA: Insufficient documentation

## 2014-11-15 DIAGNOSIS — Z7902 Long term (current) use of antithrombotics/antiplatelets: Secondary | ICD-10-CM | POA: Diagnosis not present

## 2014-11-15 DIAGNOSIS — Z86018 Personal history of other benign neoplasm: Secondary | ICD-10-CM | POA: Insufficient documentation

## 2014-11-15 DIAGNOSIS — I129 Hypertensive chronic kidney disease with stage 1 through stage 4 chronic kidney disease, or unspecified chronic kidney disease: Secondary | ICD-10-CM | POA: Insufficient documentation

## 2014-11-15 DIAGNOSIS — I714 Abdominal aortic aneurysm, without rupture, unspecified: Secondary | ICD-10-CM

## 2014-11-15 DIAGNOSIS — Z8669 Personal history of other diseases of the nervous system and sense organs: Secondary | ICD-10-CM | POA: Diagnosis not present

## 2014-11-15 DIAGNOSIS — E785 Hyperlipidemia, unspecified: Secondary | ICD-10-CM | POA: Insufficient documentation

## 2014-11-15 DIAGNOSIS — R197 Diarrhea, unspecified: Secondary | ICD-10-CM | POA: Diagnosis present

## 2014-11-15 DIAGNOSIS — R011 Cardiac murmur, unspecified: Secondary | ICD-10-CM | POA: Diagnosis not present

## 2014-11-15 DIAGNOSIS — M1990 Unspecified osteoarthritis, unspecified site: Secondary | ICD-10-CM | POA: Insufficient documentation

## 2014-11-15 LAB — COMPREHENSIVE METABOLIC PANEL
ALBUMIN: 3.6 g/dL (ref 3.5–5.0)
ALK PHOS: 41 U/L (ref 38–126)
ALT: 14 U/L — AB (ref 17–63)
ANION GAP: 9 (ref 5–15)
AST: 24 U/L (ref 15–41)
BILIRUBIN TOTAL: 0.9 mg/dL (ref 0.3–1.2)
BUN: 35 mg/dL — AB (ref 6–20)
CHLORIDE: 111 mmol/L (ref 101–111)
CO2: 19 mmol/L — ABNORMAL LOW (ref 22–32)
CREATININE: 3.02 mg/dL — AB (ref 0.61–1.24)
Calcium: 9.3 mg/dL (ref 8.9–10.3)
GFR calc non Af Amer: 19 mL/min — ABNORMAL LOW (ref >60)
GFR, EST AFRICAN AMERICAN: 22 mL/min — AB (ref >60)
GLUCOSE: 106 mg/dL — AB (ref 65–99)
Potassium: 5.2 mmol/L — ABNORMAL HIGH (ref 3.5–5.1)
Sodium: 139 mmol/L (ref 135–145)
TOTAL PROTEIN: 5.9 g/dL — AB (ref 6.5–8.1)

## 2014-11-15 LAB — CBC WITH DIFFERENTIAL/PLATELET
Basophils Absolute: 0 10*3/uL (ref 0.0–0.1)
Basophils Relative: 0 % (ref 0–1)
Eosinophils Absolute: 0.1 10*3/uL (ref 0.0–0.7)
Eosinophils Relative: 2 % (ref 0–5)
HCT: 33.6 % — ABNORMAL LOW (ref 39.0–52.0)
Hemoglobin: 10.8 g/dL — ABNORMAL LOW (ref 13.0–17.0)
LYMPHS PCT: 10 % — AB (ref 12–46)
Lymphs Abs: 0.6 10*3/uL — ABNORMAL LOW (ref 0.7–4.0)
MCH: 29.3 pg (ref 26.0–34.0)
MCHC: 32.1 g/dL (ref 30.0–36.0)
MCV: 91.1 fL (ref 78.0–100.0)
Monocytes Absolute: 0.3 10*3/uL (ref 0.1–1.0)
Monocytes Relative: 5 % (ref 3–12)
Neutro Abs: 5.4 10*3/uL (ref 1.7–7.7)
Neutrophils Relative %: 83 % — ABNORMAL HIGH (ref 43–77)
Platelets: 205 10*3/uL (ref 150–400)
RBC: 3.69 MIL/uL — ABNORMAL LOW (ref 4.22–5.81)
RDW: 14.5 % (ref 11.5–15.5)
WBC: 6.4 10*3/uL (ref 4.0–10.5)

## 2014-11-15 LAB — URINALYSIS, ROUTINE W REFLEX MICROSCOPIC
BILIRUBIN URINE: NEGATIVE
GLUCOSE, UA: NEGATIVE mg/dL
HGB URINE DIPSTICK: NEGATIVE
Ketones, ur: NEGATIVE mg/dL
Leukocytes, UA: NEGATIVE
NITRITE: NEGATIVE
PH: 5 (ref 5.0–8.0)
Protein, ur: NEGATIVE mg/dL
Specific Gravity, Urine: 1.014 (ref 1.005–1.030)
Urobilinogen, UA: 0.2 mg/dL (ref 0.0–1.0)

## 2014-11-15 MED ORDER — DICYCLOMINE HCL 10 MG/ML IM SOLN
20.0000 mg | Freq: Once | INTRAMUSCULAR | Status: AC
  Administered 2014-11-15: 20 mg via INTRAMUSCULAR

## 2014-11-15 MED ORDER — SODIUM CHLORIDE 0.9 % IV BOLUS (SEPSIS)
500.0000 mL | Freq: Once | INTRAVENOUS | Status: AC
  Administered 2014-11-15: 500 mL via INTRAVENOUS

## 2014-11-15 NOTE — ED Notes (Signed)
Pt in stating he has been having diarrhea for the last year. States he has seen multiple doctors for this, had many tests and tried many medications. Unable to find an answer for symptoms. Pt feels like symptoms increased today so he came to the ED. No distress noted.

## 2014-11-15 NOTE — ED Provider Notes (Signed)
CSN: [id number]     Arrival date & time 11/15/14  1944 History  This chart was scribed for Liora Myles, MD by Rayfield Citizen, ED Scribe. This patient was seen in room D30C/D30C and the patient's care was started at 11:07 PM.    Chief Complaint  Patient presents with  . Diarrhea  . Emesis   Patient is a 74 y.o. male presenting with diarrhea. The history is provided by the patient. No language interpreter was used.  Diarrhea Quality:  Watery Severity:  Severe Onset quality:  Gradual Duration:  4 days (although it has been going on for 2 years seen by 2 different GI specialists ) Timing:  Intermittent Progression:  Unchanged Relieved by:  Nothing Worsened by:  Nothing tried Ineffective treatments:  Anti-motility medications (Dietary changes) Associated symptoms: no abdominal pain, no recent cough, no fever and no vomiting   Risk factors: no recent antibiotic use, no sick contacts, no suspicious food intake and no travel to endemic areas      HPI Comments: Terry Garner is a 74 y.o. male who presents to the Emergency Department complaining of 4 days of diarrhea, worsening today, occurring "every 15-20 min" and described as liquid stool. He also complains of mild lower abdominal cramping, chills, decreased appetite and fatigue. Patient explains this has been a recurrent issue for the past two years, for which he has seen several specialists and received multiple tests and medications without a conclusive diagnosis. Patient's wife explains that care team believed that symptoms originated from an "infection,"  (2 years ago) but patient states that he was told he would "just have to live with it." He reports that he has been taking OTC medications and vinegar without relief. He has previously tried dietary management and probiotics with no improvement; he no longer takes probiotics.   Past Medical History  Diagnosis Date  . Hypertension   . Heart murmur   . Chronic kidney disease     CKD,  right renal artery stenosis  . AAA (abdominal aortic aneurysm)     3.6 cm (01/09/12 ultrasound)  . Carotid artery disease   . Coronary artery disease     mild left internal carotid artery stenosis  . Arthritis   . Gout   . Vitamin D deficiency   . Tubular adenoma of colon 2003    Dr. Watt Climes  . Hyperlipidemia   . Allergy   . Glaucoma   . Cataract    Past Surgical History  Procedure Laterality Date  . Cardiac catheterization      stents  last -07  . Hernia repair Right 59  . Total hip arthroplasty Left   . Shoulder arthroscopy Right   . Cervical disc surgery    . Cardiac stents    . Lumbar laminectomy  06/06/2012    Procedure: MICRODISCECTOMY LUMBAR LAMINECTOMY;  Surgeon: Marybelle Killings, MD;  Location: [facility];  Service: Orthopedics;  Laterality: Left;  Left L4-5 Microdiscectomy  . Back surgery    . Eye surgery Left 06    retinal detachment   Family History  Problem Relation Age of Onset  . Colon cancer Neg Hx   . Esophageal cancer Neg Hx   . Stomach cancer Neg Hx   . Rectal cancer Neg Hx   . Other Brother     laryngeal cancer  . Diabetes Brother   . Heart disease Mother   . Heart disease Father    History  Substance Use Topics  . Smoking  status: Never Smoker   . Smokeless tobacco: Never Used  . Alcohol Use: No    Review of Systems  Constitutional: Negative for fever and activity change.  Respiratory: Negative for cough.   Cardiovascular: Negative for chest pain.  Gastrointestinal: Positive for diarrhea. Negative for vomiting and abdominal pain.  All other systems reviewed and are negative.     Allergies  Toprol xl; Lipitor; Coumadin; and Tape  Home Medications   Prior to Admission medications   Medication Sig Start Date End Date Taking? Authorizing Provider  aspirin EC 81 MG tablet Take 81 mg by mouth daily.    Historical Provider, MD  Cholecalciferol (VITAMIN D3) 5000 UNITS TABS Take 5,000 Units by mouth daily.    Historical Provider, MD  clopidogrel  (PLAVIX) 75 MG tablet TAKE ONE TABLET BY MOUTH ONCE DAILY FOR HEART  STENTS. 09/03/14   [name], MD  fenofibrate micronized (LOFIBRA) 134 MG capsule TAKE ONE CAPSULE BY MOUTH ONCE DAILY 10/06/14   [name], MD  latanoprost (XALATAN) 0.005 % ophthalmic solution Place 1 drop into both eyes daily.  12/24/12   Historical Provider, MD  losartan (COZAAR) 100 MG tablet Take 1 tablet (100 mg total) by mouth daily. 06/07/14   [name], MD  pravastatin (PRAVACHOL) 40 MG tablet TAKE ONE TABLET BY MOUTH AT BEDTIME FOR CHOLESTROL 06/01/14   [name], MD   BP 154/49 mmHg  Pulse 52  Temp(Src) 98.8 F (37.1 C) (Oral)  Resp 18  Ht 6\' 3"  (1.905 m)  Wt 200 lb (90.719 kg)  BMI 25.00 kg/m2  SpO2 98% Physical Exam  Constitutional: He is oriented to person, place, and time. He appears well-developed and well-nourished. No distress.  Well appearing and pleasant   HENT:  Head: Normocephalic and atraumatic.  Mouth/Throat: Oropharynx is clear and moist. No oropharyngeal exudate.  Moist mucous membranes  Eyes: EOM are normal. Pupils are equal, round, and reactive to light.  Neck: Normal range of motion. Neck supple. No JVD present.  Cardiovascular: Normal rate, regular rhythm and normal heart sounds.  Exam reveals no gallop and no friction rub.   No murmur heard. Pulmonary/Chest: Effort normal and breath sounds normal. No respiratory distress. He has no wheezes. He has no rales.  Abdominal: Soft. Bowel sounds are normal. He exhibits no distension and no mass. There is no tenderness. There is no rebound and no guarding.  Musculoskeletal: Normal range of motion. He exhibits no edema.  Moves all extremities normally.   Lymphadenopathy:    He has no cervical adenopathy.  Neurological: He is alert and oriented to person, place, and time. He has normal reflexes. He displays normal reflexes.  Skin: Skin is warm and dry. No rash noted.  Good skin turgor  Psychiatric: He has a normal mood and  affect. His behavior is normal.  Nursing note and vitals reviewed.   ED Course  Procedures   DIAGNOSTIC STUDIES: Oxygen Saturation is 98% on RA, normal by my interpretation.    COORDINATION OF CARE: 11:15 PM Discussed treatment plan with pt at bedside and pt agreed to plan.   Labs Review Labs Reviewed  CBC WITH DIFFERENTIAL/PLATELET - Abnormal; Notable for the following:    RBC 3.69 (*)    Hemoglobin 10.8 (*)    HCT 33.6 (*)    Neutrophils Relative % 83 (*)    Lymphocytes Relative 10 (*)    Lymphs Abs 0.6 (*)    All other components within normal limits  COMPREHENSIVE METABOLIC PANEL -  Abnormal; Notable for the following:    Potassium 5.2 (*)    CO2 19 (*)    Glucose, Bld 106 (*)    BUN 35 (*)    Creatinine, Ser 3.02 (*)    Total Protein 5.9 (*)    ALT 14 (*)    GFR calc non Af Amer 19 (*)    GFR calc Af Amer 22 (*)    All other components within normal limits  URINALYSIS, ROUTINE W REFLEX MICROSCOPIC    Imaging Review No results found.   EKG Interpretation None      MDM   Final diagnoses:  None  IVF in the ED.  No acute issues today.  Labs unchanged from previous.  Recommend a bland diet and probiotics and close follow up with Dr. Fuller Plan for ongoing care.    I personally performed the services described in this documentation, which was scribed in my presence. The recorded information has been reviewed and is accurate.       Veatrice Kells, MD 11/16/14 [phone]

## 2014-11-15 NOTE — Progress Notes (Signed)
Abdominal Aorta Completed. Stable infrarenal AAA with current measurements of 3.5 x 3.4 cm. Oda Cogan, BS, RDMS, RVT

## 2014-11-15 NOTE — ED Notes (Signed)
Pt. reports multiple episodes or diarrhea and emesis onset last Friday with mild low abdominal cramping and chills. Poor appetite and fatigue . Denies fever .

## 2014-11-16 ENCOUNTER — Encounter (HOSPITAL_COMMUNITY): Payer: Self-pay | Admitting: Emergency Medicine

## 2014-11-16 ENCOUNTER — Emergency Department (HOSPITAL_COMMUNITY): Payer: Medicare Other

## 2014-11-16 MED ORDER — CULTURELLE DIGESTIVE HEALTH PO CAPS: 1.0000 | ORAL_CAPSULE | Freq: Three times a day (TID) | ORAL | Status: DC

## 2014-11-16 NOTE — ED Notes (Signed)
Pt given water for PO challenge 

## 2014-12-03 ENCOUNTER — Other Ambulatory Visit: Payer: Self-pay | Admitting: Internal Medicine

## 2014-12-16 ENCOUNTER — Other Ambulatory Visit: Payer: Self-pay | Admitting: Internal Medicine

## 2014-12-21 ENCOUNTER — Ambulatory Visit (INDEPENDENT_AMBULATORY_CARE_PROVIDER_SITE_OTHER): Payer: Medicare Other | Admitting: Cardiovascular Disease

## 2014-12-21 ENCOUNTER — Encounter: Payer: Self-pay | Admitting: Cardiovascular Disease

## 2014-12-21 VITALS — BP 142/52 | HR 50 | Ht 75.0 in | Wt 197.8 lb

## 2014-12-21 DIAGNOSIS — I779 Disorder of arteries and arterioles, unspecified: Secondary | ICD-10-CM

## 2014-12-21 DIAGNOSIS — I714 Abdominal aortic aneurysm, without rupture, unspecified: Secondary | ICD-10-CM

## 2014-12-21 DIAGNOSIS — I10 Essential (primary) hypertension: Secondary | ICD-10-CM | POA: Diagnosis not present

## 2014-12-21 DIAGNOSIS — I739 Peripheral vascular disease, unspecified: Secondary | ICD-10-CM

## 2014-12-21 DIAGNOSIS — R0989 Other specified symptoms and signs involving the circulatory and respiratory systems: Secondary | ICD-10-CM | POA: Diagnosis not present

## 2014-12-21 NOTE — Assessment & Plan Note (Signed)
History of small abdominal aortic aneurysm with recent Dopplers performed 11/15/14 revealing his aneurysm largest diameter of 3.5 x 3.4 cm.

## 2014-12-21 NOTE — Assessment & Plan Note (Signed)
History of mild carotid artery disease with Dopplers performed 2 years ago that showed mild left ICA stenosis. He does have bilateral bruits on exam. We will repeat his Doppler studies

## 2014-12-21 NOTE — Patient Instructions (Signed)
Your physician has requested that you have a carotid duplex. This test is an ultrasound of the carotid arteries in your neck. It looks at blood flow through these arteries that supply the brain with blood. Allow one hour for this exam. There are no restrictions or special instructions.  Dr [name] recommends that you schedule a follow-up appointment in 1 year. You will receive a reminder letter in the mail two months in advance. If you don't receive a letter, please call our office to schedule the follow-up appointment.

## 2014-12-21 NOTE — Assessment & Plan Note (Signed)
History of CAD status post LAD and diagonal branch stenting back in 2006, 7 and 8 by Dr. Rex Kras. He underwent repeat catheterization by Dr. Nona Dell 06/09/09 revealing widely patent stents after a false positive Myoview. He denies chest pain or shortness of breath. He remains on anti-platelet  therapy

## 2014-12-21 NOTE — Assessment & Plan Note (Signed)
History of hyperlipidemia on fenofibrate and pravastatin with his most recent lipid profile performed 10/14/14 revealed a total cholesterol 139, LDL 79 and HDL of 38.

## 2014-12-21 NOTE — Progress Notes (Signed)
12/21/2014 Terry Garner   04/10/1941  [id number]  Primary Physician MCKEOWN,WILLIAM DAVID, MD Primary Cardiologist: Terry Harp MD [name]  HPI: Terry Garner is a 74 year old moderately overweight married Caucasian male father of one child formally a patient of Dr. Carlean Jews. [facility]. I last saw him one year ago. He has a history of CAD status post LAD and diagonal branch stenting back in 2006, 2007 and 2008. He underwent cardiac catheterization by Dr. Nona Dell 06/09/09 revealing widely patent stents after a false positive Myoview. His other problems include chronic branch block, treated hypertension and hyperlipidemia. He denies chest pain or shortness of breath. He does have moderate carotid disease with, duplex ultrasound. He is neurologically asymptomatic on aspirin and Plavix. Since I saw him a year ago he remains completely stable     Current Outpatient Prescriptions  Medication Sig Dispense Refill  . allopurinol (ZYLOPRIM) 300 MG tablet Take 300 mg by mouth daily.    [name] aspirin EC 81 MG tablet Take 81 mg by mouth daily.    . Cholecalciferol (VITAMIN D3) 5000 UNITS TABS Take 5,000 Units by mouth daily.    . clopidogrel (PLAVIX) 75 MG tablet TAKE ONE TABLET BY MOUTH ONCE DAILY FOR HEART STENTS 90 tablet 0  . fenofibrate micronized (LOFIBRA) 134 MG capsule TAKE ONE CAPSULE BY MOUTH ONCE DAILY 30 capsule 3  . Lactobacillus-Inulin ([place]) CAPS Take 1 capsule by mouth 3 (three) times daily. 90 capsule 0  . latanoprost (XALATAN) 0.005 % ophthalmic solution Place 1 drop into both eyes daily.     [name] losartan (COZAAR) 100 MG tablet TAKE ONE TABLET BY MOUTH ONCE DAILY 90 tablet 0  . pravastatin (PRAVACHOL) 40 MG tablet TAKE ONE TABLET BY MOUTH AT BEDTIME FOR CHOLESTROL 90 tablet 0   No current facility-administered medications for this visit.    Allergies  Allergen Reactions  . Toprol Xl [Metoprolol Tartrate] Other (See Comments)    Bradycardia with  beta blockers  . Lipitor [Atorvastatin]     Myalgias   . Coumadin [Warfarin Sodium] Rash  . Tape Rash    Clear tape    History   Social History  . Marital Status: Married    Spouse Name: N/A  . Number of Children: N/A  . Years of Education: N/A   Occupational History  . Not on file.   Social History Main Topics  . Smoking status: Never Smoker   . Smokeless tobacco: Never Used  . Alcohol Use: No  . Drug Use: No  . Sexual Activity: Not on file   Other Topics Concern  . Not on file   Social History Narrative     Review of Systems: General: negative for chills, fever, night sweats or weight changes.  Cardiovascular: negative for chest pain, dyspnea on exertion, edema, orthopnea, palpitations, paroxysmal nocturnal dyspnea or shortness of breath Dermatological: negative for rash Respiratory: negative for cough or wheezing Urologic: negative for hematuria Abdominal: negative for nausea, vomiting, diarrhea, bright red blood per rectum, melena, or hematemesis Neurologic: negative for visual changes, syncope, or dizziness All other systems reviewed and are otherwise negative except as noted above.    Blood pressure 142/52, pulse 50, height 6\' 3"  (1.905 m), weight 197 lb 12.8 oz (89.721 kg).  General appearance: alert and no distress Neck: no adenopathy, no JVD, supple, symmetrical, trachea midline, thyroid not enlarged, symmetric, no tenderness/mass/nodules and bilateral carotid bruits Lungs: clear to auscultation bilaterally Heart: regular rate and rhythm, S1, S2  normal, no murmur, click, rub or gallop Extremities: extremities normal, atraumatic, no cyanosis or edema  EKG not performed today  ASSESSMENT AND PLAN:   Hyperlipidemia History of hyperlipidemia on fenofibrate and pravastatin with his most recent lipid profile performed 10/14/14 revealed a total cholesterol 139, LDL 79 and HDL of 38.  Essential hypertension History of hypertension with blood pressure  measured at 142/52. He is on losartan 100 mg a day. Continue current meds at current dosing  Carotid artery disease History of mild carotid artery disease with Dopplers performed 2 years ago that showed mild left ICA stenosis. He does have bilateral bruits on exam. We will repeat his Doppler studies  Abdominal aortic aneurysm History of small abdominal aortic aneurysm with recent Dopplers performed 11/15/14 revealing his aneurysm largest diameter of 3.5 x 3.4 cm.  ASCAD History of CAD status post LAD and diagonal branch stenting back in 2006, 7 and 8 by Dr. Rex Kras. He underwent repeat catheterization by Dr. Nona Dell 06/09/09 revealing widely patent stents after a false positive Myoview. He denies chest pain or shortness of breath. He remains on anti-platelet  therapy      Terry Harp MD [facility], [facility] 12/21/2014 9:39 AM

## 2014-12-21 NOTE — Assessment & Plan Note (Signed)
History of hypertension with blood pressure measured at 142/52. He is on losartan 100 mg a day. Continue current meds at current dosing

## 2015-01-04 ENCOUNTER — Ambulatory Visit (HOSPITAL_COMMUNITY)
Admission: RE | Admit: 2015-01-04 | Discharge: 2015-01-04 | Disposition: A | Discharge status: Home / Self Care | Payer: Medicare Other | Source: Ambulatory Visit | Attending: Cardiology | Admitting: Cardiology

## 2015-01-04 DIAGNOSIS — R0989 Other specified symptoms and signs involving the circulatory and respiratory systems: Secondary | ICD-10-CM | POA: Diagnosis not present

## 2015-01-04 DIAGNOSIS — I739 Peripheral vascular disease, unspecified: Secondary | ICD-10-CM

## 2015-01-04 DIAGNOSIS — I6523 Occlusion and stenosis of bilateral carotid arteries: Secondary | ICD-10-CM | POA: Insufficient documentation

## 2015-01-04 DIAGNOSIS — I779 Disorder of arteries and arterioles, unspecified: Secondary | ICD-10-CM | POA: Diagnosis not present

## 2015-01-06 ENCOUNTER — Telehealth: Payer: Self-pay | Admitting: *Deleted

## 2015-01-06 DIAGNOSIS — I6529 Occlusion and stenosis of unspecified carotid artery: Secondary | ICD-10-CM

## 2015-01-06 NOTE — Telephone Encounter (Signed)
Order placed for repeat carotid dopplers in 1 year  

## 2015-01-06 NOTE — Telephone Encounter (Signed)
-----   Message from [name], MD sent at 01/04/2015  4:50 PM EDT ----- Moderate ICA stenosis bilat. F/U 12 months

## 2015-01-31 ENCOUNTER — Encounter (INDEPENDENT_AMBULATORY_CARE_PROVIDER_SITE_OTHER): Payer: Medicare Other | Admitting: Ophthalmology

## 2015-01-31 DIAGNOSIS — H35033 Hypertensive retinopathy, bilateral: Secondary | ICD-10-CM | POA: Diagnosis not present

## 2015-01-31 DIAGNOSIS — H35371 Puckering of macula, right eye: Secondary | ICD-10-CM

## 2015-01-31 DIAGNOSIS — H43813 Vitreous degeneration, bilateral: Secondary | ICD-10-CM

## 2015-01-31 DIAGNOSIS — H34812 Central retinal vein occlusion, left eye: Secondary | ICD-10-CM | POA: Diagnosis not present

## 2015-01-31 DIAGNOSIS — H59031 Cystoid macular edema following cataract surgery, right eye: Secondary | ICD-10-CM

## 2015-01-31 DIAGNOSIS — I10 Essential (primary) hypertension: Secondary | ICD-10-CM | POA: Diagnosis not present

## 2015-02-04 ENCOUNTER — Other Ambulatory Visit: Payer: Self-pay | Admitting: Internal Medicine

## 2015-02-04 ENCOUNTER — Encounter: Payer: Self-pay | Admitting: Internal Medicine

## 2015-02-04 ENCOUNTER — Ambulatory Visit (INDEPENDENT_AMBULATORY_CARE_PROVIDER_SITE_OTHER): Payer: Medicare Other | Admitting: Internal Medicine

## 2015-02-04 VITALS — BP 158/80 | HR 60 | Temp 97.5°F | Resp 16 | Ht 73.0 in | Wt 204.6 lb

## 2015-02-04 DIAGNOSIS — I10 Essential (primary) hypertension: Secondary | ICD-10-CM

## 2015-02-04 DIAGNOSIS — E785 Hyperlipidemia, unspecified: Secondary | ICD-10-CM

## 2015-02-04 DIAGNOSIS — R7309 Other abnormal glucose: Secondary | ICD-10-CM

## 2015-02-04 DIAGNOSIS — R7303 Prediabetes: Secondary | ICD-10-CM

## 2015-02-04 DIAGNOSIS — E559 Vitamin D deficiency, unspecified: Secondary | ICD-10-CM

## 2015-02-04 DIAGNOSIS — Z79899 Other long term (current) drug therapy: Secondary | ICD-10-CM

## 2015-02-04 LAB — HEPATIC FUNCTION PANEL
ALBUMIN: 3.8 g/dL (ref 3.6–5.1)
ALT: 12 U/L (ref 9–46)
AST: 19 U/L (ref 10–35)
Alkaline Phosphatase: 33 U/L — ABNORMAL LOW (ref 40–115)
Bilirubin, Direct: 0.2 mg/dL (ref <=0.2)
Indirect Bilirubin: 0.4 mg/dL (ref 0.2–1.2)
TOTAL PROTEIN: 5.9 g/dL — AB (ref 6.1–8.1)
Total Bilirubin: 0.6 mg/dL (ref 0.2–1.2)

## 2015-02-04 LAB — CBC WITH DIFFERENTIAL/PLATELET
BASOS ABS: 0.1 10*3/uL (ref 0.0–0.1)
Basophils Relative: 1 % (ref 0–1)
EOS PCT: 2 % (ref 0–5)
Eosinophils Absolute: 0.1 10*3/uL (ref 0.0–0.7)
HEMATOCRIT: 30.8 % — AB (ref 39.0–52.0)
HEMOGLOBIN: 10.1 g/dL — AB (ref 13.0–17.0)
LYMPHS ABS: 0.8 10*3/uL (ref 0.7–4.0)
LYMPHS PCT: 13 % (ref 12–46)
MCH: 29.7 pg (ref 26.0–34.0)
MCHC: 32.8 g/dL (ref 30.0–36.0)
MCV: 90.6 fL (ref 78.0–100.0)
MPV: 9.8 fL (ref 8.6–12.4)
Monocytes Absolute: 0.4 10*3/uL (ref 0.1–1.0)
Monocytes Relative: 7 % (ref 3–12)
Neutro Abs: 4.7 10*3/uL (ref 1.7–7.7)
Neutrophils Relative %: 77 % (ref 43–77)
Platelets: 184 10*3/uL (ref 150–400)
RBC: 3.4 MIL/uL — ABNORMAL LOW (ref 4.22–5.81)
RDW: 15.1 % (ref 11.5–15.5)
WBC: 6.1 10*3/uL (ref 4.0–10.5)

## 2015-02-04 LAB — BASIC METABOLIC PANEL WITH GFR
BUN: 55 mg/dL — ABNORMAL HIGH (ref 7–25)
CO2: 20 mmol/L (ref 20–31)
CREATININE: 2.79 mg/dL — AB (ref 0.70–1.18)
Calcium: 9 mg/dL (ref 8.6–10.3)
Chloride: 115 mmol/L — ABNORMAL HIGH (ref 98–110)
GFR, EST NON AFRICAN AMERICAN: 21 mL/min — AB (ref >=60)
GFR, Est African American: 24 mL/min — ABNORMAL LOW (ref >=60)
Glucose, Bld: 91 mg/dL (ref 65–99)
Potassium: 5.5 mmol/L — ABNORMAL HIGH (ref 3.5–5.3)
Sodium: 142 mmol/L (ref 135–146)

## 2015-02-04 LAB — HEMOGLOBIN A1C
HEMOGLOBIN A1C: 5.3 % (ref <5.7)
Mean Plasma Glucose: 105 mg/dL (ref <117)

## 2015-02-04 LAB — LIPID PANEL
Cholesterol: 126 mg/dL (ref 125–200)
HDL: 34 mg/dL — AB (ref >=40)
LDL Cholesterol: 71 mg/dL (ref <130)
Total CHOL/HDL Ratio: 3.7 Ratio (ref <=5.0)
Triglycerides: 107 mg/dL (ref <150)
VLDL: 21 mg/dL (ref <30)

## 2015-02-04 LAB — VITAMIN D 25 HYDROXY (VIT D DEFICIENCY, FRACTURES): Vit D, 25-Hydroxy: 51 ng/mL (ref 30–100)

## 2015-02-04 LAB — MAGNESIUM: Magnesium: 1.9 mg/dL (ref 1.5–2.5)

## 2015-02-04 LAB — TSH: TSH: 2.207 u[IU]/mL (ref 0.350–4.500)

## 2015-02-04 MED ORDER — LOSARTAN POTASSIUM 100 MG PO TABS: 100.0000 mg | ORAL_TABLET | Freq: Every day | ORAL | Status: DC

## 2015-02-04 MED ORDER — HYDROCHLOROTHIAZIDE 12.5 MG PO CAPS: 12.5000 mg | ORAL_CAPSULE | Freq: Every day | ORAL | Status: DC

## 2015-02-04 NOTE — Progress Notes (Signed)
Patient ID: Terry Garner, male   DOB: 04-12-1941, 74 y.o.   MRN: [id number]  Assessment and Plan:  Hypertension:  -BP elevated today, start HCTZ recheck in 1 month -ask for cash pay price for losartan and [email or web address] coupon for better price -Continue medication,  -monitor blood pressure at home.  -Continue DASH diet.   -Reminder to go to the ER if any CP, SOB, nausea, dizziness, severe HA, changes vision/speech, left arm numbness and tingling, and jaw pain.  Cholesterol: -Continue diet and exercise.  -Check cholesterol.   Pre-diabetes: -Continue diet and exercise.  -Check A1C  Vitamin D Def: -check level -continue medications.   GERD? -extra belching and gas sympotomatically sounds like GERD -can take zantac prn for it -may help with diarrhea also.  Continue diet and meds as discussed. Further disposition pending results of labs.  HPI 74 y.o. male  presents for 3 month follow up with hypertension, hyperlipidemia, prediabetes and vitamin D.   His blood pressure has been controlled at home, today their BP is BP: (!) 158/80 mmHg.   He does not workout. He denies chest pain, shortness of breath, dizziness.   He is on cholesterol medication and denies myalgias. His cholesterol is at goal. The cholesterol last visit was:   Lab Results  Component Value Date   CHOL 139 10/14/2014   HDL 38* 10/14/2014   LDLCALC 79 10/14/2014   TRIG 111 10/14/2014   CHOLHDL 3.7 10/14/2014     He has been working on diet and exercise for prediabetes, and denies foot ulcerations, hyperglycemia, hypoglycemia , increased appetite, nausea, paresthesia of the feet, polydipsia, polyuria, visual disturbances, vomiting and weight loss. Last A1C in the office was:  Lab Results  Component Value Date   HGBA1C 4.8 10/14/2014    Patient is on Vitamin D supplement.  Lab Results  Component Value Date   VD25OH 40 10/14/2014     He reports that he has continued to have diarrhea in the morning.  He reports  that it is uaually around 6 pm.  Sometimes it is regular stool and sometimes it is loose.  He reports that it is frustrating and he is curious.     Current Medications:  Current Outpatient Prescriptions on File Prior to Visit  Medication Sig Dispense Refill  . allopurinol (ZYLOPRIM) 300 MG tablet Take 300 mg by mouth daily.    [name] aspirin EC 81 MG tablet Take 81 mg by mouth daily.    . Cholecalciferol (VITAMIN D3) 5000 UNITS TABS Take 5,000 Units by mouth daily.    . clopidogrel (PLAVIX) 75 MG tablet TAKE ONE TABLET BY MOUTH ONCE DAILY FOR HEART STENTS 90 tablet 0  . fenofibrate micronized (LOFIBRA) 134 MG capsule TAKE ONE CAPSULE BY MOUTH ONCE DAILY 30 capsule 3  . Lactobacillus-Inulin ([place]) CAPS Take 1 capsule by mouth 3 (three) times daily. 90 capsule 0  . latanoprost (XALATAN) 0.005 % ophthalmic solution Place 1 drop into both eyes daily.     [name] losartan (COZAAR) 100 MG tablet TAKE ONE TABLET BY MOUTH ONCE DAILY 90 tablet 0  . pravastatin (PRAVACHOL) 40 MG tablet TAKE ONE TABLET BY MOUTH AT BEDTIME FOR CHOLESTROL 90 tablet 0   No current facility-administered medications on file prior to visit.    Medical History:  Past Medical History  Diagnosis Date  . Hypertension   . Heart murmur   . Chronic kidney disease     CKD, right renal artery stenosis  . AAA (abdominal  aortic aneurysm)     3.6 cm (01/09/12 ultrasound)  . Carotid artery disease   . Coronary artery disease     mild left internal carotid artery stenosis  . Arthritis   . Gout   . Vitamin D deficiency   . Tubular adenoma of colon 2003    Dr. Watt Climes  . Hyperlipidemia   . Allergy   . Glaucoma   . Cataract     Allergies:  Allergies  Allergen Reactions  . Toprol Xl [Metoprolol Tartrate] Other (See Comments)    Bradycardia with beta blockers  . Lipitor [Atorvastatin]     Myalgias   . Coumadin [Warfarin Sodium] Rash  . Tape Rash    Clear tape     Review of Systems:  Review of Systems   Constitutional: Negative for fever, chills and malaise/fatigue.  HENT: Negative for congestion, ear discharge, sore throat and tinnitus.   Eyes: Negative.   Respiratory: Negative for cough, shortness of breath and wheezing.   Cardiovascular: Negative for chest pain, palpitations and leg swelling.  Gastrointestinal: Positive for diarrhea. Negative for heartburn, abdominal pain, constipation, blood in stool and melena.  Genitourinary: Negative.   Skin: Negative.   Neurological: Negative for dizziness, sensory change and loss of consciousness.  Psychiatric/Behavioral: Negative for depression. The patient is not nervous/anxious and does not have insomnia.     Family history- Review and unchanged  Social history- Review and unchanged  Physical Exam: BP 158/80 mmHg  Pulse 60  Temp(Src) 97.5 F (36.4 C)  Resp 16  Ht 6\' 1"  (1.854 m)  Wt 204 lb 9.6 oz (92.806 kg)  BMI 27.00 kg/m2 Wt Readings from Last 3 Encounters:  02/04/15 204 lb 9.6 oz (92.806 kg)  12/21/14 197 lb 12.8 oz (89.721 kg)  11/15/14 200 lb (90.719 kg)    General Appearance: Well nourished well developed, in no apparent distress. Eyes: PERRLA, EOMs, conjunctiva no swelling or erythema ENT/Mouth: Ear canals normal without obstruction, swelling, erythma, discharge.  TMs normal bilaterally.  Oropharynx moist, clear, without exudate, or postoropharyngeal swelling. Neck: Supple, thyroid normal,no cervical adenopathy  Respiratory: Respiratory effort normal, Breath sounds clear A&P without rhonchi, wheeze, or rale.  No retractions, no accessory usage. Cardio: RRR bradycardic, with no RGs.  3/6 soft blowing murmur, 1+ pulses without edema.  Abdomen: Soft, + BS,  Non tender, no guarding, rebound, hernias, masses. Musculoskeletal: Full ROM, 5/5 strength, Normal gait Skin: Warm, dry without rashes, lesions, ecchymosis.  Neuro: Awake and oriented X 3, Cranial nerves intact. Normal muscle tone, no cerebellar symptoms. Psych: Normal  affect, Insight and Judgment appropriate.    Starlyn Skeans, PA-C 9:04 AM [facility]

## 2015-02-04 NOTE — Addendum Note (Signed)
Addended by: Hortencia Martire A on: 02/04/2015 11:33 AM   Modules accepted: Orders

## 2015-02-04 NOTE — Patient Instructions (Signed)
Please start taking Hydrochlorothiazide in the mornings with your other medications to help you lower your blood pressure at home.  Please keep an eye on your blood pressure at home.  Please ask the pharmacist for the cash pay price of losartan and ask for the [email or web address] prescription.  This should bring this prescription down to roughly $8 a month.    Please take zantac as needed to help with the gas and feeling bloated in your abdomen.  This is available over the counter.

## 2015-02-05 LAB — INSULIN, RANDOM: Insulin: 8.9 u[IU]/mL (ref 2.0–19.6)

## 2015-02-10 ENCOUNTER — Other Ambulatory Visit: Payer: Self-pay | Admitting: Internal Medicine

## 2015-02-24 ENCOUNTER — Ambulatory Visit (INDEPENDENT_AMBULATORY_CARE_PROVIDER_SITE_OTHER): Payer: Medicare Other | Admitting: Internal Medicine

## 2015-02-24 ENCOUNTER — Encounter: Payer: Self-pay | Admitting: Internal Medicine

## 2015-02-24 ENCOUNTER — Other Ambulatory Visit: Payer: Self-pay | Admitting: Internal Medicine

## 2015-02-24 VITALS — BP 162/66 | HR 68 | Temp 98.2°F | Resp 18 | Ht 73.0 in | Wt 202.0 lb

## 2015-02-24 DIAGNOSIS — R21 Rash and other nonspecific skin eruption: Secondary | ICD-10-CM

## 2015-02-24 MED ORDER — CLOTRIMAZOLE-BETAMETHASONE 1-0.05 % EX CREA: 1.0000 "application " | TOPICAL_CREAM | Freq: Two times a day (BID) | CUTANEOUS | Status: DC

## 2015-02-24 MED ORDER — PREDNISONE 20 MG PO TABS: ORAL_TABLET | ORAL | Status: DC

## 2015-02-24 MED ORDER — TRIAMCINOLONE ACETONIDE 0.1 % EX CREA: 1.0000 "application " | TOPICAL_CREAM | Freq: Two times a day (BID) | CUTANEOUS | Status: DC

## 2015-02-24 MED ORDER — KETOCONAZOLE 2 % EX CREA: 1.0000 "application " | TOPICAL_CREAM | Freq: Every day | CUTANEOUS | Status: DC

## 2015-02-24 NOTE — Progress Notes (Signed)
   Subjective:    Patient ID: Terry Garner, male    DOB: 03/22/1941, 74 y.o.   MRN: [id number]  Rash Pertinent negatives include no fatigue, fever, shortness of breath or vomiting.   Patient reports to the office for evaluation of rash which started on Monday.  Patient reports that the rash is on his arm and his trunk.  He reports that the rash is itchy and red.  He reports that he has been working outside for the past several days.  He has had heat rash in the past.  He has tried bathing in bleach water, corn starch and changing his soap.  He reports that nobody else has a rash.     Review of Systems  Constitutional: Negative for fever, chills and fatigue.  Respiratory: Negative for chest tightness and shortness of breath.   Cardiovascular: Negative for chest pain and palpitations.  Gastrointestinal: Negative for nausea and vomiting.  Skin: Positive for rash.       Objective:   Physical Exam  Constitutional: He is oriented to person, place, and time. He appears well-developed and well-nourished. No distress.  HENT:  Head: Normocephalic.  Mouth/Throat: Oropharynx is clear and moist. No oropharyngeal exudate.  Eyes: Conjunctivae are normal. No scleral icterus.  Neck: Normal range of motion. Neck supple. No JVD present. No thyromegaly present.  Cardiovascular: Normal rate, regular rhythm, normal heart sounds and intact distal pulses.  Exam reveals no gallop and no friction rub.   No murmur heard. Pulmonary/Chest: Effort normal and breath sounds normal. No respiratory distress. He has no wheezes. He has no rales. He exhibits no tenderness.  Abdominal: Soft. Bowel sounds are normal. He exhibits no distension and no mass. There is no tenderness. There is no rebound and no guarding.  Musculoskeletal: Normal range of motion.  Lymphadenopathy:    He has no cervical adenopathy.  Neurological: He is alert and oriented to person, place, and time.  Skin: Skin is warm and dry. Rash noted. He  is not diaphoretic.  Flat macular excoriated rash across the abdomen around the beltline and low back with few scattered papules.  Same rash is located under the armpits and upper biceps.    Nursing note reviewed.         Assessment & Plan:    1. Rash and nonspecific skin eruption -likely yeast rash vs. Possible contact dermatitis -prednisone taper -lortisone cream -benadryl prn -cool compresses

## 2015-02-24 NOTE — Patient Instructions (Signed)
Please start taking prednisone as it says on the bottle.  Take it until it is all the way gone.  Use the cream that I am giving you up to 3 times a day.   Please use benadryl every six hours to help with itching.  Use cold compresses.  Call if rash gets worse, there is fever, chills,  Nausea or vomiting as that is a sign of infection.

## 2015-03-07 ENCOUNTER — Other Ambulatory Visit: Payer: Self-pay | Admitting: Internal Medicine

## 2015-03-10 ENCOUNTER — Other Ambulatory Visit: Payer: Self-pay | Admitting: Internal Medicine

## 2015-03-14 ENCOUNTER — Ambulatory Visit (INDEPENDENT_AMBULATORY_CARE_PROVIDER_SITE_OTHER): Payer: Medicare Other | Admitting: Ophthalmology

## 2015-03-14 DIAGNOSIS — I10 Essential (primary) hypertension: Secondary | ICD-10-CM | POA: Diagnosis not present

## 2015-03-14 DIAGNOSIS — H34812 Central retinal vein occlusion, left eye: Secondary | ICD-10-CM | POA: Diagnosis not present

## 2015-03-14 DIAGNOSIS — H35371 Puckering of macula, right eye: Secondary | ICD-10-CM

## 2015-03-14 DIAGNOSIS — H43813 Vitreous degeneration, bilateral: Secondary | ICD-10-CM

## 2015-03-14 DIAGNOSIS — H59031 Cystoid macular edema following cataract surgery, right eye: Secondary | ICD-10-CM

## 2015-03-14 DIAGNOSIS — H35033 Hypertensive retinopathy, bilateral: Secondary | ICD-10-CM

## 2015-03-16 ENCOUNTER — Encounter: Payer: Self-pay | Admitting: Internal Medicine

## 2015-03-16 ENCOUNTER — Ambulatory Visit (INDEPENDENT_AMBULATORY_CARE_PROVIDER_SITE_OTHER): Payer: Medicare Other | Admitting: Internal Medicine

## 2015-03-16 VITALS — BP 168/68 | HR 52 | Temp 97.8°F | Resp 16 | Ht 73.0 in | Wt 203.0 lb

## 2015-03-16 DIAGNOSIS — I10 Essential (primary) hypertension: Secondary | ICD-10-CM

## 2015-03-16 DIAGNOSIS — Z23 Encounter for immunization: Secondary | ICD-10-CM | POA: Diagnosis not present

## 2015-03-16 NOTE — Progress Notes (Signed)
   Subjective:    Patient ID: Terry Garner, male    DOB: April 13, 1941, 74 y.o.   MRN: [id number]  HPI  Patient presnets to the office for evaluation of elevated blood pressure.  He reports that he is not takin gthe HCTZ as when he checks his BP at home it is usually 120/80 or less.  He doesn't want to start another medication.  He thinks that his blood pressure is doing well.  He reports that he checks his blodd pressure regularly.  He has had consistently elevated BP readings here in the office for the last 3 visits.    Review of Systems  Constitutional: Negative for fever, chills and fatigue.  Respiratory: Negative for chest tightness and shortness of breath.   Cardiovascular: Negative for chest pain, palpitations and leg swelling.       Objective:   Physical Exam  Constitutional: He is oriented to person, place, and time. He appears well-developed and well-nourished. No distress.  HENT:  Head: Normocephalic.  Mouth/Throat: Oropharynx is clear and moist. No oropharyngeal exudate.  Eyes: Conjunctivae are normal. No scleral icterus.  Cardiovascular: Normal rate, regular rhythm, normal heart sounds and intact distal pulses.  Exam reveals no gallop and no friction rub.   No murmur heard. Pulmonary/Chest: Effort normal and breath sounds normal. No respiratory distress. He has no wheezes. He has no rales. He exhibits no tenderness.  Musculoskeletal: Normal range of motion.  Neurological: He is alert and oriented to person, place, and time.  Skin: Skin is warm and dry. He is not diaphoretic.  Psychiatric: He has a normal mood and affect. His behavior is normal. Judgment and thought content normal.  Nursing note and vitals reviewed.         Assessment & Plan:    1. Need for prophylactic vaccination and inoculation against influenza  - Flu vaccine HIGH DOSE PF (Fluzone High dose)  2. Essential hypertension -either patient has white coat hypertension or patients blood pressure is  actually elevated.  I believe that patient needs at least 1/2 tablet HCTZ.  Patient will check with different machines at home and if still elevated in the next week is to take 1/2 tablet of blood pressure medication.

## 2015-03-22 ENCOUNTER — Other Ambulatory Visit: Payer: Self-pay | Admitting: Internal Medicine

## 2015-04-04 ENCOUNTER — Other Ambulatory Visit: Payer: Self-pay | Admitting: Internal Medicine

## 2015-04-04 ENCOUNTER — Other Ambulatory Visit: Payer: Self-pay | Admitting: Physician Assistant

## 2015-04-25 ENCOUNTER — Encounter (INDEPENDENT_AMBULATORY_CARE_PROVIDER_SITE_OTHER): Payer: Medicare Other | Admitting: Ophthalmology

## 2015-04-25 DIAGNOSIS — H34812 Central retinal vein occlusion, left eye, with macular edema: Secondary | ICD-10-CM

## 2015-04-25 DIAGNOSIS — H59031 Cystoid macular edema following cataract surgery, right eye: Secondary | ICD-10-CM | POA: Diagnosis not present

## 2015-04-25 DIAGNOSIS — H348121 Central retinal vein occlusion, left eye, with retinal neovascularization: Secondary | ICD-10-CM | POA: Diagnosis not present

## 2015-04-25 DIAGNOSIS — H35371 Puckering of macula, right eye: Secondary | ICD-10-CM | POA: Diagnosis not present

## 2015-04-25 DIAGNOSIS — H43813 Vitreous degeneration, bilateral: Secondary | ICD-10-CM | POA: Diagnosis not present

## 2015-04-25 DIAGNOSIS — I10 Essential (primary) hypertension: Secondary | ICD-10-CM

## 2015-04-25 DIAGNOSIS — H35033 Hypertensive retinopathy, bilateral: Secondary | ICD-10-CM

## 2015-05-11 ENCOUNTER — Ambulatory Visit: Payer: Self-pay | Admitting: Internal Medicine

## 2015-05-17 ENCOUNTER — Encounter: Payer: Self-pay | Admitting: Internal Medicine

## 2015-05-17 ENCOUNTER — Ambulatory Visit (INDEPENDENT_AMBULATORY_CARE_PROVIDER_SITE_OTHER): Payer: Medicare Other | Admitting: Internal Medicine

## 2015-05-17 VITALS — BP 138/66 | HR 52 | Temp 97.2°F | Resp 16 | Ht 73.75 in | Wt 205.8 lb

## 2015-05-17 DIAGNOSIS — E559 Vitamin D deficiency, unspecified: Secondary | ICD-10-CM

## 2015-05-17 DIAGNOSIS — Z6826 Body mass index (BMI) 26.0-26.9, adult: Secondary | ICD-10-CM | POA: Diagnosis not present

## 2015-05-17 DIAGNOSIS — R7303 Prediabetes: Secondary | ICD-10-CM

## 2015-05-17 DIAGNOSIS — I10 Essential (primary) hypertension: Secondary | ICD-10-CM

## 2015-05-17 DIAGNOSIS — M1000 Idiopathic gout, unspecified site: Secondary | ICD-10-CM

## 2015-05-17 DIAGNOSIS — Z6823 Body mass index (BMI) 23.0-23.9, adult: Secondary | ICD-10-CM | POA: Insufficient documentation

## 2015-05-17 DIAGNOSIS — I25700 Atherosclerosis of coronary artery bypass graft(s), unspecified, with unstable angina pectoris: Secondary | ICD-10-CM

## 2015-05-17 DIAGNOSIS — Z79899 Other long term (current) drug therapy: Secondary | ICD-10-CM

## 2015-05-17 DIAGNOSIS — E785 Hyperlipidemia, unspecified: Secondary | ICD-10-CM | POA: Diagnosis not present

## 2015-05-17 LAB — CBC WITH DIFFERENTIAL/PLATELET
BASOS ABS: 0 10*3/uL (ref 0.0–0.1)
Basophils Relative: 1 % (ref 0–1)
EOS PCT: 4 % (ref 0–5)
Eosinophils Absolute: 0.2 10*3/uL (ref 0.0–0.7)
HEMATOCRIT: 30.2 % — AB (ref 39.0–52.0)
Hemoglobin: 10 g/dL — ABNORMAL LOW (ref 13.0–17.0)
LYMPHS ABS: 0.7 10*3/uL (ref 0.7–4.0)
LYMPHS PCT: 17 % (ref 12–46)
MCH: 29.9 pg (ref 26.0–34.0)
MCHC: 33.1 g/dL (ref 30.0–36.0)
MCV: 90.1 fL (ref 78.0–100.0)
MONO ABS: 0.3 10*3/uL (ref 0.1–1.0)
MPV: 9.6 fL (ref 8.6–12.4)
Monocytes Relative: 7 % (ref 3–12)
Neutro Abs: 3.1 10*3/uL (ref 1.7–7.7)
Neutrophils Relative %: 71 % (ref 43–77)
Platelets: 183 10*3/uL (ref 150–400)
RBC: 3.35 MIL/uL — ABNORMAL LOW (ref 4.22–5.81)
RDW: 15.2 % (ref 11.5–15.5)
WBC: 4.3 10*3/uL (ref 4.0–10.5)

## 2015-05-17 LAB — BASIC METABOLIC PANEL WITH GFR
BUN: 39 mg/dL — AB (ref 7–25)
CHLORIDE: 111 mmol/L — AB (ref 98–110)
CO2: 22 mmol/L (ref 20–31)
Calcium: 9.2 mg/dL (ref 8.6–10.3)
Creat: 2.32 mg/dL — ABNORMAL HIGH (ref 0.70–1.18)
GFR, EST AFRICAN AMERICAN: 30 mL/min — AB (ref >=60)
GFR, Est Non African American: 26 mL/min — ABNORMAL LOW (ref >=60)
Glucose, Bld: 88 mg/dL (ref 65–99)
POTASSIUM: 5.5 mmol/L — AB (ref 3.5–5.3)
Sodium: 141 mmol/L (ref 135–146)

## 2015-05-17 LAB — HEPATIC FUNCTION PANEL
ALBUMIN: 3.5 g/dL — AB (ref 3.6–5.1)
ALK PHOS: 38 U/L — AB (ref 40–115)
ALT: 11 U/L (ref 9–46)
AST: 21 U/L (ref 10–35)
Bilirubin, Direct: 0.2 mg/dL (ref <=0.2)
Indirect Bilirubin: 0.5 mg/dL (ref 0.2–1.2)
TOTAL PROTEIN: 5.5 g/dL — AB (ref 6.1–8.1)
Total Bilirubin: 0.7 mg/dL (ref 0.2–1.2)

## 2015-05-17 LAB — LIPID PANEL
Cholesterol: 136 mg/dL (ref 125–200)
HDL: 34 mg/dL — ABNORMAL LOW (ref >=40)
LDL CALC: 79 mg/dL (ref <130)
Total CHOL/HDL Ratio: 4 Ratio (ref <=5.0)
Triglycerides: 117 mg/dL (ref <150)
VLDL: 23 mg/dL (ref <30)

## 2015-05-17 LAB — URIC ACID: Uric Acid, Serum: 5.1 mg/dL (ref 4.0–7.8)

## 2015-05-17 LAB — MAGNESIUM: Magnesium: 1.8 mg/dL (ref 1.5–2.5)

## 2015-05-17 LAB — TSH: TSH: 1.476 u[IU]/mL (ref 0.350–4.500)

## 2015-05-17 NOTE — Patient Instructions (Signed)

## 2015-05-17 NOTE — Progress Notes (Signed)
Patient ID: Terry Garner, male   DOB: 05/26/1941, 74 y.o.   MRN: [id number]   This very nice 74 y.o. MWM presents for  follow up with Hypertension, Hyperlipidemia, Pre-Diabetes and Vitamin D Deficiency. 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    Patient is treated for HTN since 1998 and in 2001 & 2008 has PTCA/Stents. In 2010, he had a false (+) stress test with a negative cath & BP has been controlled at home. Today's BP: 138/66 mmHg. Patient has had no complaints of any cardiac type chest pain, palpitations, dyspnea/orthopnea/PND, dizziness, claudication, or dependent edema.   Hyperlipidemia is controlled with diet & meds. Patient denies myalgias or other med SE's. Last Lipids were at goal with Cholesterol 136; HDL 34*; LDL 79; Triglycerides 117 on 05/17/2015.   Also, the patient has history of PreDiabetes since July 2013 with A1c 6.0% and has had no symptoms of reactive hypoglycemia, diabetic polys, paresthesias or visual blurring.  Last A1c was 5.3% on 02/04/2015.   Further, the patient also has history of Vitamin D Deficiency and supplements vitamin D without any suspected side-effects. Last vitamin D was 51 on 02/04/2015.  Medication Sig  . allopurinol300 MG tab Take 300 mg by mouth daily.  [name] aspirin EC 81 MG Take 81 mg by mouth daily.  . brimonidine (ALPHAGAN) 0.15 % oph soln Place 1 drop into both eyes 2 (two) times daily.  [name] VITAMIN D3 5000 Take 5,000 Units by mouth daily.  . clopidogrel  75 MG tab TAKE ONE TABLET BY MOUTH ONCE DAILY FOR HEART STENTS  . fenofibrate134 MG cap TAKE ONE CAPSULE BY MOUTH ONCE DAILY  . losartan (COZAAR) 100 MG tab Take 1 tablet (100 mg total) by mouth daily.  . nepafenac (ILEVRO) 0.3 % oph susp Place 1 drop into the right eye daily.  . pravastatin  40 MG tablet TAKE ONE TABLET BY MOUTH AT BEDTIME FOR CHOLESTEROL.  [name] ketoconazole (NIZORAL) 2 % crm Apply 1 application topically  daily.  [name] PRED FORTE 1 % oph susp Place 1 drop into the right eye 4 (four) times daily.  [name] triamcinolone crm (KENALOG) 0.1 % Apply 1 application topically 2 (two) times daily.   Allergies  Allergen Reactions  . Toprol Xl [Metoprolol Tartrate] Other (See Comments)    Bradycardia with beta blockers  . Lipitor [Atorvastatin]     Myalgias   . Coumadin [Warfarin Sodium] Rash  . Tape Rash    Clear tape    PMHx:   Past Medical History  Diagnosis Date  . Hypertension   . Heart murmur   . Chronic kidney disease     CKD, right renal artery stenosis  . AAA (abdominal aortic aneurysm) ([facility])     3.6 cm (01/09/12 ultrasound)  . Carotid artery disease ([place])   . Coronary artery disease     mild left internal carotid artery stenosis  . Arthritis   . Gout   . Vitamin D deficiency   . Tubular adenoma of colon 2003    Dr. Watt Climes  . Hyperlipidemia   . Allergy   . Glaucoma   . Cataract    Immunization History  Administered Date(s) Administered  . Influenza Split 04/09/2013  . Influenza, High Dose Seasonal PF 03/12/2014, 03/16/2015  . Pneumococcal Conjugate-13 03/12/2014  . Pneumococcal-Unspecified 06/25/2005  . Td 06/25/2002   Past Surgical History  Procedure Laterality Date  .  Cardiac catheterization      stents  last -07  . Hernia repair Right 22  . Total hip arthroplasty Left   . Shoulder arthroscopy Right   . Cervical disc surgery    . Cardiac stents    . Lumbar laminectomy  06/06/2012    Procedure: MICRODISCECTOMY LUMBAR LAMINECTOMY;  Surgeon: Marybelle Killings, MD;  Location: [facility];  Service: Orthopedics;  Laterality: Left;  Left L4-5 Microdiscectomy  . Back surgery    . Eye surgery Left 06    retinal detachment   FHx:    Reviewed / unchanged  SHx:    Reviewed / unchanged  Systems Review:  Constitutional: Denies fever, chills, wt changes, headaches, insomnia, fatigue, night sweats, change in appetite. Eyes: Denies redness, blurred vision, diplopia, discharge, itchy, watery  eyes.  ENT: Denies discharge, congestion, post nasal drip, epistaxis, sore throat, earache, hearing loss, dental pain, tinnitus, vertigo, sinus pain, snoring.  CV: Denies chest pain, palpitations, irregular heartbeat, syncope, dyspnea, diaphoresis, orthopnea, PND, claudication or edema. Respiratory: denies cough, dyspnea, DOE, pleurisy, hoarseness, laryngitis, wheezing.  Gastrointestinal: Denies dysphagia, odynophagia, heartburn, reflux, water brash, abdominal pain or cramps, nausea, vomiting, bloating, diarrhea, constipation, hematemesis, melena, hematochezia  or hemorrhoids. Genitourinary: Denies dysuria, frequency, urgency, nocturia, hesitancy, discharge, hematuria or flank pain. Musculoskeletal: Denies arthralgias, myalgias, stiffness, jt. swelling, pain, limping or strain/sprain.  Skin: Denies pruritus, rash, hives, warts, acne, eczema or change in skin lesion(s). Neuro: No weakness, tremor, incoordination, spasms, paresthesia or pain. Psychiatric: Denies confusion, memory loss or sensory loss. Endo: Denies change in weight, skin or hair change.  Heme/Lymph: No excessive bleeding, bruising or enlarged lymph nodes.  Physical Exam  BP 138/66 mmHg  Pulse 52  Temp(Src) 97.2 F (36.2 C)  Resp 16  Ht 6' 1.75" (1.873 m)  Wt 205 lb 12.8 oz (93.35 kg)  BMI 26.61 kg/m2  Appears well nourished and in no distress. Eyes: PERRLA, EOMs, conjunctiva no swelling or erythema. Sinuses: No frontal/maxillary tenderness ENT/Mouth: EAC's clear, TM's nl w/o erythema, bulging. Nares clear w/o erythema, swelling, exudates. Oropharynx clear without erythema or exudates. Oral hygiene is good. Tongue normal, non obstructing. Hearing intact.  Neck: Supple. Thyroid nl. Car 2+/2+ without bruits, nodes or JVD. Chest: Respirations nl with BS clear & equal w/o rales, rhonchi, wheezing or stridor.  Cor: Heart sounds normal w/ regular rate and rhythm without sig. murmurs, gallops, clicks, or rubs. Peripheral pulses  normal and equal  without edema.  Abdomen: Soft & bowel sounds normal. Non-tender w/o guarding, rebound, hernias, masses, or organomegaly.  Lymphatics: Unremarkable.  Musculoskeletal: Full ROM all peripheral extremities, joint stability, 5/5 strength, and normal gait.  Skin: Warm, dry without exposed rashes, lesions or ecchymosis apparent.  Neuro: Cranial nerves intact, reflexes equal bilaterally. Sensory-motor testing grossly intact. Tendon reflexes grossly intact.  Pysch: Alert & oriented x 3.  Insight and judgement nl & appropriate. No ideations.  Assessment and Plan:  1. Essential hypertension  - TSH  2. Hyperlipidemia  - Lipid panel - TSH  3. Prediabetes  - Hemoglobin A1c - Insulin, random  4. Vitamin D deficiency  - VITAMIN D 25 Hydroxy  5. Idiopathic gout  - Uric acid  6. 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 Medication management   8. BMI 26.0-26.9,adult  - CBC with Differential/Platelet - BASIC METABOLIC PANEL WITH GFR - Hepatic function panel - Magnesium   Recommended regular exercise, BP monitoring, weight control, and discussed  med and SE's. Recommended labs to assess and monitor clinical status. Further disposition pending results of labs. Over 30 minutes of exam, counseling, chart review was performed

## 2015-05-18 LAB — VITAMIN D 25 HYDROXY (VIT D DEFICIENCY, FRACTURES): VIT D 25 HYDROXY: 45 ng/mL (ref 30–100)

## 2015-05-18 LAB — HEMOGLOBIN A1C
Hgb A1c MFr Bld: 5 % (ref <5.7)
MEAN PLASMA GLUCOSE: 97 mg/dL (ref <117)

## 2015-05-18 LAB — INSULIN, RANDOM: Insulin: 9.1 u[IU]/mL (ref 2.0–19.6)

## 2015-06-02 ENCOUNTER — Other Ambulatory Visit: Payer: Self-pay | Admitting: Internal Medicine

## 2015-07-04 ENCOUNTER — Encounter (INDEPENDENT_AMBULATORY_CARE_PROVIDER_SITE_OTHER): Payer: Medicare Other | Admitting: Ophthalmology

## 2015-07-20 ENCOUNTER — Encounter (INDEPENDENT_AMBULATORY_CARE_PROVIDER_SITE_OTHER): Payer: Medicare Other | Admitting: Ophthalmology

## 2015-07-20 DIAGNOSIS — H35033 Hypertensive retinopathy, bilateral: Secondary | ICD-10-CM | POA: Diagnosis not present

## 2015-07-20 DIAGNOSIS — H43813 Vitreous degeneration, bilateral: Secondary | ICD-10-CM | POA: Diagnosis not present

## 2015-07-20 DIAGNOSIS — H34812 Central retinal vein occlusion, left eye, with macular edema: Secondary | ICD-10-CM

## 2015-07-20 DIAGNOSIS — I10 Essential (primary) hypertension: Secondary | ICD-10-CM | POA: Diagnosis not present

## 2015-07-20 DIAGNOSIS — H59031 Cystoid macular edema following cataract surgery, right eye: Secondary | ICD-10-CM | POA: Diagnosis not present

## 2015-07-20 DIAGNOSIS — H35371 Puckering of macula, right eye: Secondary | ICD-10-CM | POA: Diagnosis not present

## 2015-08-08 ENCOUNTER — Other Ambulatory Visit: Payer: Self-pay | Admitting: Internal Medicine

## 2015-08-08 ENCOUNTER — Other Ambulatory Visit: Payer: Self-pay | Admitting: *Deleted

## 2015-08-08 MED ORDER — FENOFIBRATE MICRONIZED 134 MG PO CAPS: 134.0000 mg | ORAL_CAPSULE | Freq: Every day | ORAL | Status: DC

## 2015-08-18 ENCOUNTER — Ambulatory Visit (INDEPENDENT_AMBULATORY_CARE_PROVIDER_SITE_OTHER): Payer: Medicare Other | Admitting: Physician Assistant

## 2015-08-18 ENCOUNTER — Other Ambulatory Visit: Payer: Self-pay | Admitting: Physician Assistant

## 2015-08-18 ENCOUNTER — Encounter: Payer: Self-pay | Admitting: Physician Assistant

## 2015-08-18 VITALS — BP 134/66 | HR 54 | Temp 97.7°F | Resp 16 | Ht 73.0 in | Wt 207.0 lb

## 2015-08-18 DIAGNOSIS — M1000 Idiopathic gout, unspecified site: Secondary | ICD-10-CM

## 2015-08-18 DIAGNOSIS — Z6826 Body mass index (BMI) 26.0-26.9, adult: Secondary | ICD-10-CM | POA: Diagnosis not present

## 2015-08-18 DIAGNOSIS — Z79899 Other long term (current) drug therapy: Secondary | ICD-10-CM

## 2015-08-18 DIAGNOSIS — R7309 Other abnormal glucose: Secondary | ICD-10-CM | POA: Diagnosis not present

## 2015-08-18 DIAGNOSIS — Z0001 Encounter for general adult medical examination with abnormal findings: Secondary | ICD-10-CM | POA: Diagnosis not present

## 2015-08-18 DIAGNOSIS — E785 Hyperlipidemia, unspecified: Secondary | ICD-10-CM

## 2015-08-18 DIAGNOSIS — R6889 Other general symptoms and signs: Secondary | ICD-10-CM

## 2015-08-18 DIAGNOSIS — Z23 Encounter for immunization: Secondary | ICD-10-CM

## 2015-08-18 DIAGNOSIS — R7303 Prediabetes: Secondary | ICD-10-CM | POA: Diagnosis not present

## 2015-08-18 DIAGNOSIS — Z0000 Encounter for general adult medical examination without abnormal findings: Secondary | ICD-10-CM

## 2015-08-18 DIAGNOSIS — I10 Essential (primary) hypertension: Secondary | ICD-10-CM

## 2015-08-18 DIAGNOSIS — I714 Abdominal aortic aneurysm, without rupture, unspecified: Secondary | ICD-10-CM

## 2015-08-18 DIAGNOSIS — I25700 Atherosclerosis of coronary artery bypass graft(s), unspecified, with unstable angina pectoris: Secondary | ICD-10-CM

## 2015-08-18 DIAGNOSIS — I779 Disorder of arteries and arterioles, unspecified: Secondary | ICD-10-CM | POA: Diagnosis not present

## 2015-08-18 DIAGNOSIS — M5126 Other intervertebral disc displacement, lumbar region: Secondary | ICD-10-CM

## 2015-08-18 DIAGNOSIS — E559 Vitamin D deficiency, unspecified: Secondary | ICD-10-CM

## 2015-08-18 DIAGNOSIS — I739 Peripheral vascular disease, unspecified: Secondary | ICD-10-CM

## 2015-08-18 DIAGNOSIS — D649 Anemia, unspecified: Secondary | ICD-10-CM | POA: Diagnosis not present

## 2015-08-18 LAB — CBC WITH DIFFERENTIAL/PLATELET
Basophils Absolute: 0.1 10*3/uL (ref 0.0–0.1)
Basophils Relative: 1 % (ref 0–1)
Eosinophils Absolute: 0.2 10*3/uL (ref 0.0–0.7)
Eosinophils Relative: 4 % (ref 0–5)
HEMATOCRIT: 31.7 % — AB (ref 39.0–52.0)
HEMOGLOBIN: 10.2 g/dL — AB (ref 13.0–17.0)
LYMPHS PCT: 16 % (ref 12–46)
Lymphs Abs: 0.9 10*3/uL (ref 0.7–4.0)
MCH: 29.3 pg (ref 26.0–34.0)
MCHC: 32.2 g/dL (ref 30.0–36.0)
MCV: 91.1 fL (ref 78.0–100.0)
MONOS PCT: 7 % (ref 3–12)
MPV: 10.1 fL (ref 8.6–12.4)
Monocytes Absolute: 0.4 10*3/uL (ref 0.1–1.0)
NEUTROS ABS: 4.2 10*3/uL (ref 1.7–7.7)
Neutrophils Relative %: 72 % (ref 43–77)
Platelets: 195 10*3/uL (ref 150–400)
RBC: 3.48 MIL/uL — AB (ref 4.22–5.81)
RDW: 15.6 % — ABNORMAL HIGH (ref 11.5–15.5)
WBC: 5.8 10*3/uL (ref 4.0–10.5)

## 2015-08-18 LAB — HEPATIC FUNCTION PANEL
ALT: 11 U/L (ref 9–46)
AST: 20 U/L (ref 10–35)
Albumin: 3.8 g/dL (ref 3.6–5.1)
Alkaline Phosphatase: 37 U/L — ABNORMAL LOW (ref 40–115)
BILIRUBIN INDIRECT: 0.5 mg/dL (ref 0.2–1.2)
Bilirubin, Direct: 0.2 mg/dL (ref <=0.2)
TOTAL PROTEIN: 5.9 g/dL — AB (ref 6.1–8.1)
Total Bilirubin: 0.7 mg/dL (ref 0.2–1.2)

## 2015-08-18 LAB — BASIC METABOLIC PANEL WITH GFR
BUN: 45 mg/dL — ABNORMAL HIGH (ref 7–25)
CALCIUM: 9.6 mg/dL (ref 8.6–10.3)
CHLORIDE: 113 mmol/L — AB (ref 98–110)
CO2: 20 mmol/L (ref 20–31)
Creat: 2.69 mg/dL — ABNORMAL HIGH (ref 0.70–1.18)
GFR, EST NON AFRICAN AMERICAN: 22 mL/min — AB (ref >=60)
GFR, Est African American: 25 mL/min — ABNORMAL LOW (ref >=60)
GLUCOSE: 81 mg/dL (ref 65–99)
Potassium: 6.1 mmol/L — ABNORMAL HIGH (ref 3.5–5.3)
Sodium: 142 mmol/L (ref 135–146)

## 2015-08-18 LAB — LIPID PANEL
CHOLESTEROL: 130 mg/dL (ref 125–200)
HDL: 30 mg/dL — ABNORMAL LOW (ref >=40)
LDL CALC: 77 mg/dL (ref <130)
Total CHOL/HDL Ratio: 4.3 Ratio (ref <=5.0)
Triglycerides: 117 mg/dL (ref <150)
VLDL: 23 mg/dL (ref <30)

## 2015-08-18 LAB — MAGNESIUM: MAGNESIUM: 2 mg/dL (ref 1.5–2.5)

## 2015-08-18 LAB — TSH: TSH: 2.1 mIU/L (ref 0.40–4.50)

## 2015-08-18 MED ORDER — PRAVASTATIN SODIUM 40 MG PO TABS: ORAL_TABLET | ORAL | Status: DC

## 2015-08-18 NOTE — Progress Notes (Addendum)
Patient ID: Terry Garner, male   DOB: 1941/02/15, 75 y.o.   MRN: [id number]  MEDICARE ANNUAL WELLNESS VISIT AND Quarterly OV  Assessment:   1. Hypertension - TSH  2. Hyperlipidemia - Lipid panel  3. PreDiabetes - Hemoglobin A1c - Insulin, fasting  4. Vitamin D deficiency - Vit D  25 hydroxy (rtn osteoporosis monitoring)  5. Gout, unspecified cause, unspecified chronicity, unspecified site   6. Carotid artery disease, unspecified laterality Control blood pressure, cholesterol, glucose, increase exercise.   7. 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   8. Abdominal aortic aneurysm Control blood pressure, cholesterol, glucose, increase exercise.   9. Herniated lumbar intervertebral disc   10. Encounter for long-term (current) use of other medications - CBC with Differential - BASIC METABOLIC PANEL WITH GFR - Hepatic function panel - Magnesium  11. 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      Future Appointments Date Time Provider [place]  10/18/2015 9:15 AM [name], MD [facility] None  11/03/2015 2:00 PM [name], MD [facility] None     Plan:   During the course of the visit the patient was educated and counseled about appropriate screening and preventive services including:    Pneumococcal vaccine   Influenza vaccine  Td vaccine  Screening electrocardiogram  Bone densitometry screening  Colorectal cancer screening  Diabetes screening  Glaucoma screening  Nutrition counseling   Advanced directives: requested  Conditions/risks identified: BMI: Discussed weight loss, diet, and increase physical activity.  Increase physical activity: AHA recommends 150 minutes of physical activity a week.   Medications reviewed He is screened for PreDiabetes and he is at goal - ARB therapy: Yes. Urinary Incontinence is not an issue: discussed non pharmacology and pharmacology options.  Fall risk: low- discussed PT, home fall assessment, medications.    Subjective:  Terry Garner is a 75 y.o. MWM who presents for Medicare Annual Wellness Visit and follow up.  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 His blood pressure has been controlled at home, today their BP is BP: 134/66 mmHg He does not workout but has been straightening out the yard, moving things in his basement due to furnace going out. He denies chest pain, shortness of breath, dizziness.  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  Lab Results  Component Value Date   GFRNONAA 22* 08/18/2015   He is on cholesterol medication and denies myalgias. His cholesterol is at goal. The cholesterol last visit was:   Lab Results  Component Value Date   CHOL 136 05/17/2015   HDL 34* 05/17/2015   LDLCALC 79 05/17/2015   TRIG 117 05/17/2015   CHOLHDL 4.0 05/17/2015   He is screened for PreDiabetes, was 6.0 in July 2013. He has been working on diet and exercise for prediabetes, and denies foot ulcerations, hyperglycemia, hypoglycemia , nausea, paresthesia of the feet, polydipsia, polyuria and visual disturbances. Last A1C in the office was:  Lab Results  Component Value Date   HGBA1C 5.0 05/17/2015   Patient is on Vitamin D supplement.   Lab Results  Component Value Date   VD25OH 45 05/17/2015  Names of Other Physician/Practitioners you currently use: 1. [facility] here for primary care 2. Dr Katy Fitch and Dr. Zigmund Daniel for [place] , eye doctor, last visit Jan 2017 3. Has dentist, last visit July 2014   Patient Care Team: [name], MD as  PCP - General (Internal Medicine) Marybelle Killings, MD as Consulting Physician (Orthopedic Surgery) Clarene Essex, MD as Consulting Physician (Gastroenterology) Ladene Artist, MD as Consulting Physician (Gastroenterology) [name], MD as Consulting Physician (Cardiology) Clent Jacks, MD as Consulting Physician (Ophthalmology)  Medication Review:   Medication List       This list is accurate as of: 08/18/15 11:48 AM.  Always use your most recent med list.               allopurinol 300 MG tablet  Commonly known as:  ZYLOPRIM  Take 300 mg by mouth daily.     aspirin EC 81 MG tablet  Take 81 mg by mouth daily.     brimonidine 0.15 % ophthalmic solution  Commonly known as:  ALPHAGAN  Place 1 drop into both eyes 2 (two) times daily.     clopidogrel 75 MG tablet  Commonly known as:  PLAVIX  TAKE ONE TABLET BY MOUTH ONCE DAILY FOR HEART STENTS     dorzolamide-timolol 22.3-6.8 MG/ML ophthalmic solution  Commonly known as:  COSOPT  Place 1 drop into both eyes 2 (two) times daily.     fenofibrate micronized 134 MG capsule  Commonly known as:  LOFIBRA  Take 1 capsule (134 mg total) by mouth daily.     ILEVRO 0.3 % ophthalmic suspension  Generic drug:  nepafenac  Place 1 drop into the right eye daily.     losartan 100 MG tablet  Commonly known as:  COZAAR  Take 1 tablet (100 mg total) by mouth daily.     pravastatin 40 MG tablet  Commonly known as:  PRAVACHOL  TAKE ONE TABLET BY MOUTH AT BEDTIME FOR CHOLESTEROL.     Vitamin D3 5000 units Tabs  Take 5,000 Units by mouth daily.       Current Problems (verified) Patient Active Problem List   Diagnosis Date Noted  . BMI 26.0-26.9,adult 05/17/2015  . Medicare annual wellness visit, subsequent 05/17/2015  . Abdominal aortic aneurysm ([place]) 12/02/2013  . Medication management 09/01/2013  . Essential hypertension 07/17/2013  . Prediabetes 07/17/2013  . Gout   . Vitamin D deficiency   . 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 Carotid  artery disease ([place]) 01/15/2013  . Right bundle branch block 01/15/2013  . Hyperlipidemia 01/15/2013  . Herniated lumbar intervertebral disc 06/06/2012   Screening Tests Immunization History  Administered Date(s) Administered  . Influenza Split 04/09/2013  . Influenza, High Dose Seasonal PF 03/12/2014, 03/16/2015  . Pneumococcal Conjugate-13 03/12/2014  . Pneumococcal-Unspecified 06/25/2005  . Td 06/25/2002   Preventative care: Last colonoscopy: 09/2006  Prior vaccinations: TD or Tdap: 2014  Influenza: HD 02/2015  Pneumococcal: 06/2005 Prevnar 13: 2015 Shingles/Zostavax: declined  History reviewed: allergies, current medications, past family history, past medical history, past social history, past surgical history and problem list  Allergies Allergies  Allergen Reactions  . Toprol Xl [Metoprolol Tartrate] Other (See Comments)    Bradycardia with beta blockers  . Lipitor [Atorvastatin]     Myalgias   . Coumadin [Warfarin Sodium] Rash  . Tape Rash    Clear tape  . Warfarin Rash   Surgical history Past Surgical History  Procedure Laterality Date  .  Cardiac catheterization      stents  last -07  . Hernia repair Right 14  . Total hip arthroplasty Left   . Shoulder arthroscopy Right   . Cervical disc surgery    . Cardiac stents    . Lumbar laminectomy  06/06/2012    Procedure: MICRODISCECTOMY LUMBAR LAMINECTOMY;  Surgeon: Marybelle Killings, MD;  Location: [facility];  Service: Orthopedics;  Laterality: Left;  Left L4-5 Microdiscectomy  . Back surgery    . Eye surgery Left 06    retinal detachment   Family history Family History  Problem Relation Age of Onset  . Colon cancer Neg Hx   . Esophageal cancer Neg Hx   . Stomach cancer Neg Hx   . Rectal cancer Neg Hx   . Other Brother     laryngeal cancer  . Heart disease Brother   . Diabetes Brother   . Heart disease Mother   . Heart disease Father   . Stroke Father   . Heart disease Sister    Risk  Factors: Tobacco Social History  Substance Use Topics  . Smoking status: Never Smoker   . Smokeless tobacco: Never Used  . Alcohol Use: No   He does not smoke.  Patient is not a former smoker. Are there smokers in your home (other than you)?  No  Alcohol Current alcohol use: none  Caffeine Current caffeine use: denies use  Exercise Current exercise: none  Nutrition/Diet Current diet: in general, a "healthy" diet    Cardiac risk factors: advanced age (older than 29 for men, 48 for women), dyslipidemia, hypertension, male gender, obesity (BMI >= 30 kg/m2) and sedentary lifestyle.  Depression Screen (Note: if answer to either of the following is "Yes", a more complete depression screening is indicated)   Q1: Over the past two weeks, have you felt down, depressed or hopeless? No  Q2: Over the past two weeks, have you felt little interest or pleasure in doing things? No  Have you lost interest or pleasure in daily life? No  Do you often feel hopeless? No  Do you cry easily over simple problems? No  Activities of Daily Living In your present state of health, do you have any difficulty performing the following activities?:  Driving? No Managing money?  No Feeding yourself? No Getting from bed to chair? No Climbing a flight of stairs? No Preparing food and eating?: No Bathing or showering? No Getting dressed: No Getting to the toilet? No Using the toilet:No Moving around from place to place: No In the past year have you fallen or had a near fall?:No  Vision Difficulties: No  Hearing Difficulties: No Do you often ask people to speak up or repeat themselves? No Do you experience ringing or noises in your ears? No Do you have difficulty understanding soft or whispered voices? No  Cognition  Do you feel that you have a problem with memory?No  Do you often misplace items? No  Do you feel safe at home?  Yes  Advanced directives Does patient have a [place]? Yes Does patient have a Living Will? Yes   Objective:     Blood pressure 134/66, pulse 54, temperature 97.7 F (36.5 C), temperature source Temporal, resp. rate 16, height 6\' 1"  (1.854 m), weight 207 lb (93.895 kg), SpO2 99 %. Body mass index is 27.32 kg/(m^2).  General appearance: alert, no distress, WD/WN, male Cognitive Testing  Alert? Yes  Normal Appearance?Yes  Oriented to person?  Yes  Place? Yes   Time? Yes  Recall of three objects?  Yes  Can perform simple calculations? Yes  Displays appropriate judgment?Yes  Can read the correct time from a watch face?Yes  HEENT: normocephalic, sclerae anicteric, TMs pearly, nares patent, no discharge or erythema, pharynx normal Oral cavity: MMM, no lesions Neck: supple, no lymphadenopathy, no thyromegaly, no masses Heart: RRR, normal S1, S2, no murmurs Lungs: CTA bilaterally, no wheezes, rhonchi, or rales Abdomen: +bs, soft, non tender, non distended, no masses, no hepatomegaly, no splenomegaly Musculoskeletal: nontender, no swelling, no obvious deformity Extremities: no edema, no cyanosis, no clubbing Pulses: 2+ symmetric, upper and lower extremities, normal cap refill Neurological: alert, oriented x 3, CN2-12 intact, strength normal upper extremities and lower extremities, sensation normal throughout, DTRs 2+ throughout, no cerebellar signs, gait normal Psychiatric: normal affect, behavior normal, pleasant   Medicare Attestation I have personally reviewed: The patient's medical and social history Their use of alcohol, tobacco or illicit drugs Their current medications and supplements The patient's functional ability including ADLs,fall risks, home safety risks, cognitive, and hearing and visual impairment Diet and physical activities Evidence for depression or mood disorders  The patient's weight, height, BMI, and visual acuity have been recorded in the chart.  I have made referrals, counseling, and provided education to  the patient based on review of the above and I have provided the patient with a written personalized care plan for preventive services.    Vicie Mutters, PA-C   08/18/2015

## 2015-08-18 NOTE — Patient Instructions (Signed)
[employer] with no obligation # [phone] Do not have to be a member Tues-Sat 10-6  [employer]- free test with no obligation # [phone] MUST BE A MEMBER Call for store hours   Preventive Care for Adults A healthy lifestyle and preventive care can promote health and wellness. Preventive health guidelines for men include the following key practices:  A routine yearly physical is a good way to check with your health care provider about your health and preventative screening. It is a chance to share any concerns and updates on your health and to receive a thorough exam.  Visit your dentist for a routine exam and preventative care every 6 months. Brush your teeth twice a day and floss once a day. Good oral hygiene prevents tooth decay and gum disease.  The frequency of eye exams is based on your age, health, family medical history, use of contact lenses, and other factors. Follow your health care provider's recommendations for frequency of eye exams.  Eat a healthy diet. Foods such as vegetables, fruits, whole grains, low-fat dairy products, and lean protein foods contain the nutrients you need without too many calories. Decrease your intake of foods high in solid fats, added sugars, and salt. Eat the right amount of calories for you.Get information about a proper diet from your health care provider, if necessary.  Regular physical exercise is one of the most important things you can do for your health. Most adults should get at least 150 minutes of moderate-intensity exercise (any activity that increases your heart rate and causes you to sweat) each week. In addition, most adults need muscle-strengthening exercises on 2 or more days a week.  Maintain a healthy weight. The body mass index (BMI) is a screening tool to identify possible weight problems. It provides an estimate of body fat based on height and weight. Your health care provider can find your BMI and  can help you achieve or maintain a healthy weight.For adults 20 years and older:  A BMI below 18.5 is considered underweight.  A BMI of 18.5 to 24.9 is normal.  A BMI of 25 to 29.9 is considered overweight.  A BMI of 30 and above is considered obese.  Maintain normal blood lipids and cholesterol levels by exercising and minimizing your intake of saturated fat. Eat a balanced diet with plenty of fruit and vegetables. Blood tests for lipids and cholesterol should begin at age 73 and be repeated every 5 years. If your lipid or cholesterol levels are high, you are over 50, or you are at high risk for heart disease, you may need your cholesterol levels checked more frequently.Ongoing high lipid and cholesterol levels should be treated with medicines if diet and exercise are not working.  If you smoke, find out from your health care provider how to quit. If you do not use tobacco, do not start.  Lung cancer screening is recommended for adults aged 65-80 years who are at high risk for developing lung cancer because of a history of smoking. A yearly low-dose CT scan of the lungs is recommended for people who have at least a 30-pack-year history of smoking and are a current smoker or have quit within the past 15 years. A pack year of smoking is smoking an average of 1 pack of cigarettes a day for 1 year (for example: 1 pack a day for 30 years or 2 packs a day for 15 years). Yearly screening should continue until the  smoker has stopped smoking for at least 15 years. Yearly screening should be stopped for people who develop a health problem that would prevent them from having lung cancer treatment.  If you choose to drink alcohol, do not have more than 2 drinks per day. One drink is considered to be 12 ounces (355 mL) of beer, 5 ounces (148 mL) of wine, or 1.5 ounces (44 mL) of liquor.  Avoid use of street drugs. Do not share needles with anyone. Ask for help if you need support or instructions about  stopping the use of drugs.  High blood pressure causes heart disease and increases the risk of stroke. Your blood pressure should be checked at least every 1-2 years. Ongoing high blood pressure should be treated with medicines, if weight loss and exercise are not effective.  If you are 20-69 years old, ask your health care provider if you should take aspirin to prevent heart disease.  Diabetes screening involves taking a blood sample to check your fasting blood sugar level. Testing should be considered at a younger age or be carried out more frequently if you are overweight and have at least 1 risk factor for diabetes.  Colorectal cancer can be detected and often prevented. Most routine colorectal cancer screening begins at the age of 33 and continues through age 57. However, your health care provider may recommend screening at an earlier age if you have risk factors for colon cancer. On a yearly basis, your health care provider may provide home test kits to check for hidden blood in the stool. Use of a small camera at the end of a tube to directly examine the colon (sigmoidoscopy or colonoscopy) can detect the earliest forms of colorectal cancer. Talk to your health care provider about this at age 68, when routine screening begins. Direct exam of the colon should be repeated every 5-10 years through age 67, unless early forms of precancerous polyps or small growths are found.  Hepatitis C blood testing is recommended for all people born from [birth year] through 1965 and any individual with known risks for hepatitis C.  New guidelines recommend a once time screening for HIV.   Screening for abdominal aortic aneurysm (AAA)  by ultrasound is recommended for people who have history of high blood pressure or who are current or former smokers.  Healthy men should  receive prostate-specific antigen (PSA) blood tests as part of routine cancer screening. Talk with your health care provider about prostate cancer  screening.  Testicular cancer screening is  recommended for adult males. Screening includes self-exam, a health care provider exam, and other screening tests. Consult with your health care provider about any symptoms you have or any concerns you have about testicular cancer.  Use sunscreen. Apply sunscreen liberally and repeatedly throughout the day. You should seek shade when your shadow is shorter than you. Protect yourself by wearing long sleeves, pants, a wide-brimmed hat, and sunglasses year round, whenever you are outdoors.  Once a month, do a whole-body skin exam, using a mirror to look at the skin on your back. Tell your health care provider about new moles, moles that have irregular borders, moles that are larger than a pencil eraser, or moles that have changed in shape or color.  Stay current with required vaccines (immunizations).  Influenza vaccine. All adults should be immunized every year.  Tetanus, diphtheria, and acellular pertussis (Td, Tdap) vaccine. An adult who has not previously received Tdap or who does not know his vaccine status  should receive 1 dose of Tdap. This initial dose should be followed by tetanus and diphtheria toxoids (Td) booster doses every 10 years. Adults with an unknown or incomplete history of completing a 3-dose immunization series with Td-containing vaccines should begin or complete a primary immunization series including a Tdap dose. Adults should receive a Td booster every 10 years.  Zoster vaccine. One dose is recommended for adults aged 64 years or older unless certain conditions are present.    PREVNAR - Pneumococcal 13-valent conjugate (PCV13) vaccine. When indicated, a person who is uncertain of his immunization history and has no record of immunization should receive the PCV13 vaccine. An adult aged 65 years or older who has certain medical conditions and has not been previously immunized should receive 1 dose of PCV13 vaccine. This PCV13 should be  followed with a dose of pneumococcal polysaccharide (PPSV23) vaccine. The PPSV23 vaccine dose should be obtained at least 8 weeks after the dose of PCV13 vaccine. An adult aged 85 years or older who has certain medical conditions and previously received 1 or more doses of PPSV23 vaccine should receive 1 dose of PCV13. The PCV13 vaccine dose should be obtained 1 or more years after the last PPSV23 vaccine dose.    PNEUMOVAX - Pneumococcal polysaccharide (PPSV23) vaccine. When PCV13 is also indicated, PCV13 should be obtained first. All adults aged 89 years and older should be immunized. An adult younger than age 25 years who has certain medical conditions should be immunized. Any person who resides in a nursing home or long-term care facility should be immunized. An adult smoker should be immunized. People with an immunocompromised condition and certain other conditions should receive both PCV13 and PPSV23 vaccines. People with human immunodeficiency virus (HIV) infection should be immunized as soon as possible after diagnosis. Immunization during chemotherapy or radiation therapy should be avoided. Routine use of PPSV23 vaccine is not recommended for American Indians, [place] Natives, or people younger than 65 years unless there are medical conditions that require PPSV23 vaccine. When indicated, people who have unknown immunization and have no record of immunization should receive PPSV23 vaccine. One-time revaccination 5 years after the first dose of PPSV23 is recommended for people aged 19-64 years who have chronic kidney failure, nephrotic syndrome, asplenia, or immunocompromised conditions. People who received 1-2 doses of PPSV23 before age 64 years should receive another dose of PPSV23 vaccine at age 50 years or later if at least 5 years have passed since the previous dose. Doses of PPSV23 are not needed for people immunized with PPSV23 at or after age 62 years.    Hepatitis A vaccine. Adults who wish to  be protected from this disease, have certain high-risk conditions, work with hepatitis A-infected animals, work in hepatitis A research labs, or travel to or work in countries with a high rate of hepatitis A should be immunized. Adults who were previously unvaccinated and who anticipate close contact with an international adoptee during the first 60 days after arrival in the [place] from a country with a high rate of hepatitis A should be immunized.    Hepatitis B vaccine. Adults should be immunized if they wish to be protected from this disease, have certain high-risk conditions, may be exposed to blood or other infectious body fluids, are household contacts or sex partners of hepatitis B positive people, are clients or workers in certain care facilities, or travel to or work in countries with a high rate of hepatitis B.   Preventive Service /  Frequency   Ages 61 and over  Blood pressure check.  Lipid and cholesterol check.  Lung cancer screening. / Every year if you are aged 47-80 years and have a 30-pack-year history of smoking and currently smoke or have quit within the past 15 years. Yearly screening is stopped once you have quit smoking for at least 15 years or develop a health problem that would prevent you from having lung cancer treatment.  Fecal occult blood test (FOBT) of stool. You may not have to do this test if you get a colonoscopy every 10 years.  Flexible sigmoidoscopy** or colonoscopy.** / Every 5 years for a flexible sigmoidoscopy or every 10 years for a colonoscopy beginning at age 59 and continuing until age 59.  Hepatitis C blood test.** / For all people born from [birth year] through 1967 and any individual with known risks for hepatitis C.  Abdominal aortic aneurysm (AAA) screening./ Screening current or former smokers or have Hypertension.  Skin self-exam. / Monthly.  Influenza vaccine. / Every year.  Tetanus, diphtheria, and acellular pertussis (Tdap/Td)  vaccine.** / 1 dose of Td every 10 years.   Zoster vaccine.** / 1 dose for adults aged 53 years or older.         Pneumococcal 13-valent conjugate (PCV13) vaccine.    Pneumococcal polysaccharide (PPSV23) vaccine.     Hepatitis A vaccine.** / Consult your health care provider.  Hepatitis B vaccine.** / Consult your health care provider. Screening for abdominal aortic aneurysm (AAA)  by ultrasound is recommended for people who have history of high blood pressure or who are current or former smokers.

## 2015-08-19 ENCOUNTER — Encounter: Payer: Self-pay | Admitting: Physician Assistant

## 2015-08-19 DIAGNOSIS — D649 Anemia, unspecified: Secondary | ICD-10-CM | POA: Insufficient documentation

## 2015-08-19 DIAGNOSIS — N184 Chronic kidney disease, stage 4 (severe): Secondary | ICD-10-CM | POA: Insufficient documentation

## 2015-08-19 LAB — VITAMIN D 25 HYDROXY (VIT D DEFICIENCY, FRACTURES): VIT D 25 HYDROXY: 48 ng/mL (ref 30–100)

## 2015-08-19 LAB — HEMOGLOBIN A1C
HEMOGLOBIN A1C: 5.4 % (ref <5.7)
MEAN PLASMA GLUCOSE: 108 mg/dL (ref <117)

## 2015-08-23 LAB — IRON AND TIBC
%SAT: 28 % (ref 15–60)
Iron: 108 ug/dL (ref 50–180)
TIBC: 381 ug/dL (ref 250–425)
UIBC: 273 ug/dL (ref 125–400)

## 2015-08-23 LAB — VITAMIN B12: VITAMIN B 12: 372 pg/mL (ref 200–1100)

## 2015-08-23 LAB — FERRITIN: FERRITIN: 307 ng/mL (ref 20–380)

## 2015-08-30 ENCOUNTER — Ambulatory Visit (INDEPENDENT_AMBULATORY_CARE_PROVIDER_SITE_OTHER): Payer: Medicare Other | Admitting: Physician Assistant

## 2015-08-30 ENCOUNTER — Encounter: Payer: Self-pay | Admitting: Physician Assistant

## 2015-08-30 VITALS — BP 140/68 | HR 54 | Temp 97.5°F | Resp 16 | Ht 73.0 in | Wt 207.6 lb

## 2015-08-30 DIAGNOSIS — N184 Chronic kidney disease, stage 4 (severe): Secondary | ICD-10-CM | POA: Diagnosis not present

## 2015-08-30 DIAGNOSIS — Z79899 Other long term (current) drug therapy: Secondary | ICD-10-CM | POA: Diagnosis not present

## 2015-08-30 DIAGNOSIS — M109 Gout, unspecified: Secondary | ICD-10-CM | POA: Diagnosis not present

## 2015-08-30 DIAGNOSIS — M1000 Idiopathic gout, unspecified site: Secondary | ICD-10-CM

## 2015-08-30 DIAGNOSIS — I10 Essential (primary) hypertension: Secondary | ICD-10-CM

## 2015-08-30 LAB — BASIC METABOLIC PANEL WITH GFR
BUN: 42 mg/dL — AB (ref 7–25)
CALCIUM: 9.2 mg/dL (ref 8.6–10.3)
CHLORIDE: 112 mmol/L — AB (ref 98–110)
CO2: 21 mmol/L (ref 20–31)
CREATININE: 2.98 mg/dL — AB (ref 0.70–1.18)
GFR, Est African American: 22 mL/min — ABNORMAL LOW (ref >=60)
GFR, Est Non African American: 19 mL/min — ABNORMAL LOW (ref >=60)
GLUCOSE: 87 mg/dL (ref 65–99)
Potassium: 6 mmol/L — ABNORMAL HIGH (ref 3.5–5.3)
SODIUM: 140 mmol/L (ref 135–146)

## 2015-08-30 LAB — URIC ACID: URIC ACID, SERUM: 5.3 mg/dL (ref 4.0–7.8)

## 2015-08-30 LAB — MAGNESIUM: Magnesium: 2.1 mg/dL (ref 1.5–2.5)

## 2015-08-30 MED ORDER — AMLODIPINE BESYLATE 2.5 MG PO TABS: 2.5000 mg | ORAL_TABLET | Freq: Every day | ORAL | Status: DC

## 2015-08-30 NOTE — Patient Instructions (Signed)
Monitor your blood pressure, if it goes up over 150/90 get on norvasc 2.5mg  sent in Will be sending you to kidney doctor Increase water  Monitor your blood pressure at home. Go to the ER if any CP, SOB, nausea, dizziness, severe HA, changes vision/speech  Goal BP:  For patients younger than 60: Goal BP < 140/90. For patients 60 and older: Goal BP < 150/90. For patients with diabetes: Goal BP < 140/90. Your most recent BP: BP: 140/68 mmHg   Take your medications faithfully as instructed. Maintain a healthy weight. Get at least 150 minutes of aerobic exercise per week. Minimize salt intake. Minimize alcohol intake  DASH Eating Plan DASH stands for "Dietary Approaches to Stop Hypertension." The DASH eating plan is a healthy eating plan that has been shown to reduce high blood pressure (hypertension). Additional health benefits may include reducing the risk of type 2 diabetes mellitus, heart disease, and stroke. The DASH eating plan may also help with weight loss. WHAT DO I NEED TO KNOW ABOUT THE DASH EATING PLAN? For the DASH eating plan, you will follow these general guidelines:  Choose foods with a percent daily value for sodium of less than 5% (as listed on the food label).  Use salt-free seasonings or herbs instead of table salt or sea salt.  Check with your health care provider or pharmacist before using salt substitutes.  Eat lower-sodium products, often labeled as "lower sodium" or "no salt added."  Eat fresh foods.  Eat more vegetables, fruits, and low-fat dairy products.  Choose whole grains. Look for the word "whole" as the first word in the ingredient list.  Choose fish and skinless chicken or [place] more often than red meat. Limit fish, poultry, and meat to 6 oz (170 g) each day.  Limit sweets, desserts, sugars, and sugary drinks.  Choose heart-healthy fats.  Limit cheese to 1 oz (28 g) per day.  Eat more home-cooked food and less restaurant, buffet, and fast  food.  Limit fried foods.  Cook foods using methods other than frying.  Limit canned vegetables. If you do use them, rinse them well to decrease the sodium.  When eating at a restaurant, ask that your food be prepared with less salt, or no salt if possible. WHAT FOODS CAN I EAT? Seek help from a dietitian for individual calorie needs. Grains Whole grain or whole wheat bread. Brown rice. Whole grain or whole wheat pasta. Quinoa, bulgur, and whole grain cereals. Low-sodium cereals. Corn or whole wheat flour tortillas. Whole grain cornbread. Whole grain crackers. Low-sodium crackers. Vegetables Fresh or frozen vegetables (raw, steamed, roasted, or grilled). Low-sodium or reduced-sodium tomato and vegetable juices. Low-sodium or reduced-sodium tomato sauce and paste. Low-sodium or reduced-sodium canned vegetables.  Fruits All fresh, canned (in natural juice), or frozen fruits. Meat and Other Protein Products Ground beef (85% or leaner), grass-fed beef, or beef trimmed of fat. Skinless chicken or [place]. Ground chicken or [place]. Pork trimmed of fat. All fish and seafood. Eggs. Dried beans, peas, or lentils. Unsalted nuts and seeds. Unsalted canned beans. Dairy Low-fat dairy products, such as skim or 1% milk, 2% or reduced-fat cheeses, low-fat ricotta or cottage cheese, or plain low-fat yogurt. Low-sodium or reduced-sodium cheeses. Fats and Oils Tub margarines without trans fats. Light or reduced-fat mayonnaise and salad dressings (reduced sodium). Avocado. Safflower, olive, or canola oils. Natural peanut or almond butter. Other Unsalted popcorn and pretzels. The items listed above may not be a complete list of recommended foods or beverages.  Contact your dietitian for more options. WHAT FOODS ARE NOT RECOMMENDED? Grains White bread. White pasta. White rice. Refined cornbread. Bagels and croissants. Crackers that contain trans fat. Vegetables Creamed or fried vegetables. Vegetables in a  cheese sauce. Regular canned vegetables. Regular canned tomato sauce and paste. Regular tomato and vegetable juices. Fruits Dried fruits. Canned fruit in light or heavy syrup. Fruit juice. Meat and Other Protein Products Fatty cuts of meat. Ribs, chicken wings, bacon, sausage, bologna, salami, chitterlings, fatback, hot dogs, bratwurst, and packaged luncheon meats. Salted nuts and seeds. Canned beans with salt. Dairy Whole or 2% milk, cream, half-and-half, and cream cheese. Whole-fat or sweetened yogurt. Full-fat cheeses or blue cheese. Nondairy creamers and whipped toppings. Processed cheese, cheese spreads, or cheese curds. Condiments Onion and garlic salt, seasoned salt, table salt, and sea salt. Canned and packaged gravies. Worcestershire sauce. Tartar sauce. Barbecue sauce. Teriyaki sauce. Soy sauce, including reduced sodium. Steak sauce. Fish sauce. Oyster sauce. Cocktail sauce. Horseradish. Ketchup and mustard. Meat flavorings and tenderizers. Bouillon cubes. Hot sauce. Tabasco sauce. Marinades. Taco seasonings. Relishes. Fats and Oils Butter, stick margarine, lard, shortening, ghee, and bacon fat. Coconut, palm kernel, or palm oils. Regular salad dressings. Other Pickles and olives. Salted popcorn and pretzels. The items listed above may not be a complete list of foods and beverages to avoid. Contact your dietitian for more information. WHERE CAN I FIND MORE INFORMATION? National Heart, Lung, and Blood Institute: travelstabloid.com Document Released: 05/31/2011 Document Revised: 10/26/2013 Document Reviewed: 04/15/2013 [facility] Patient Information 2015 [place], [place]. This information is not intended to replace advice given to you by your health care provider. Make sure you discuss any questions you have with your health care provider.

## 2015-08-30 NOTE — Progress Notes (Signed)
Assessment and Plan: Hyperkalemia- asymptomatic- will check mag, potassium again, likely due to CKD- continue losartan 50mg  for now, will add on low dose norvasc for BP Hypertension- will add on norvasc for BP greater than 140/90 at home CKD stage 4 - with hyperkalemia- will refer to nephrologist, may need repeat US for RAS   HPI 75 y.o. WM with history of HTN, CKD stage 4, CAD presents for follow up for hyperkalemia. BP: 140/68 mmHg  He has not increased his water, he drinks about 3 bottles of water a day, he has cut his losartan in half, he stopped his Mag supplements, and his allopurinol was cut in half. He has sinus bradycardia but denies any irregular heart beats, muscle weakness, or fatigue.  Lab Results  Component Value Date   CREATININE 2.69* 08/18/2015   BUN 45* 08/18/2015   NA 142 08/18/2015   K 6.1* 08/18/2015   CL 113* 08/18/2015   CO2 20 08/18/2015   Blood pressure 140/68, pulse 54, temperature 97.5 F (36.4 C), temperature source Temporal, resp. rate 16, height 6\' 1"  (1.854 m), weight 207 lb 9.6 oz (94.167 kg), SpO2 99 %.    Past Medical History  Diagnosis Date  . Hypertension   . Heart murmur   . Chronic kidney disease     CKD, right renal artery stenosis  . AAA (abdominal aortic aneurysm) ([facility])     3.6 cm (01/09/12 ultrasound)  . Carotid artery disease ([place])   . Coronary artery disease     mild left internal carotid artery stenosis  . Arthritis   . Gout   . Vitamin D deficiency   . Tubular adenoma of colon 2003    Dr. Watt Climes  . Hyperlipidemia   . Allergy   . Glaucoma   . Cataract      Allergies  Allergen Reactions  . Toprol Xl [Metoprolol Tartrate] Other (See Comments)    Bradycardia with beta blockers  . Lipitor [Atorvastatin]     Myalgias   . Coumadin [Warfarin Sodium] Rash  . Tape Rash    Clear tape  . Warfarin Rash      Current Outpatient Prescriptions on File Prior to Visit  Medication Sig Dispense Refill  . allopurinol (ZYLOPRIM) 300 MG  tablet Take 300 mg by mouth daily.    [name] aspirin EC 81 MG tablet Take 81 mg by mouth daily.    . brimonidine (ALPHAGAN) 0.15 % ophthalmic solution Place 1 drop into both eyes 2 (two) times daily.    . Cholecalciferol (VITAMIN D3) 5000 UNITS TABS Take 5,000 Units by mouth daily.    . clopidogrel (PLAVIX) 75 MG tablet TAKE ONE TABLET BY MOUTH ONCE DAILY FOR HEART STENTS 90 tablet 2  . dorzolamide-timolol (COSOPT) 22.3-6.8 MG/ML ophthalmic solution Place 1 drop into both eyes 2 (two) times daily.    . fenofibrate micronized (LOFIBRA) 134 MG capsule Take 1 capsule (134 mg total) by mouth daily. 90 capsule 1  . losartan (COZAAR) 100 MG tablet Take 1 tablet (100 mg total) by mouth daily. 90 tablet 0  . nepafenac (ILEVRO) 0.3 % ophthalmic suspension Place 1 drop into the right eye daily.    . pravastatin (PRAVACHOL) 40 MG tablet TAKE ONE TABLET BY MOUTH AT BEDTIME FOR CHOLESTEROL. 90 tablet 1   No current facility-administered medications on file prior to visit.    ROS: all negative except above.   Physical Exam: Filed Weights   08/30/15 0900  Weight: 207 lb 9.6 oz (94.167 kg)  BP 140/68 mmHg  Pulse 54  Temp(Src) 97.5 F (36.4 C) (Temporal)  Resp 16  Ht 6\' 1"  (1.854 m)  Wt 207 lb 9.6 oz (94.167 kg)  BMI 27.40 kg/m2  SpO2 99% HEENT: normocephalic, sclerae anicteric, TMs pearly, nares patent, no discharge or erythema, pharynx normal Oral cavity: MMM, no lesions Neck: supple, no lymphadenopathy, no thyromegaly, no masses Heart: RRR, normal S1, S2, no murmurs Lungs: CTA bilaterally, no wheezes, rhonchi, or rales Abdomen: +bs, soft, non tender, non distended, no masses, no hepatomegaly, no splenomegaly Musculoskeletal: nontender, no swelling, no obvious deformity Extremities: no edema, no cyanosis, no clubbing Pulses: 2+ symmetric, upper and lower extremities, normal cap refill Neurological: alert, oriented x 3, CN2-12 intact, strength normal upper extremities and lower extremities,  DTRs  2+ throughout, no cerebellar signs, gait normal Psychiatric: normal affect, behavior normal, pleasant     Vicie Mutters, PA-C 9:45 AM [facility]

## 2015-08-31 MED ORDER — FUROSEMIDE 20 MG PO TABS: 20.0000 mg | ORAL_TABLET | Freq: Every day | ORAL | Status: DC

## 2015-08-31 NOTE — Addendum Note (Signed)
Addended by: Vicie Mutters R on: 08/31/2015 08:52 AM   Modules accepted: Orders

## 2015-09-09 ENCOUNTER — Telehealth: Payer: Self-pay

## 2015-09-09 DIAGNOSIS — E875 Hyperkalemia: Secondary | ICD-10-CM | POA: Diagnosis not present

## 2015-09-09 DIAGNOSIS — I129 Hypertensive chronic kidney disease with stage 1 through stage 4 chronic kidney disease, or unspecified chronic kidney disease: Secondary | ICD-10-CM | POA: Diagnosis not present

## 2015-09-09 DIAGNOSIS — I159 Secondary hypertension, unspecified: Secondary | ICD-10-CM

## 2015-09-09 DIAGNOSIS — N184 Chronic kidney disease, stage 4 (severe): Secondary | ICD-10-CM | POA: Diagnosis not present

## 2015-09-09 NOTE — Telephone Encounter (Signed)
Pt called per MD requests to setup for renal dopplers.  Pt agreed and f/u set for appt on 10/14/15.  Orders placed; sent to scheduling to setup.   Okay to speak with pt wife when called. Pt stated it is best as she take care of all his appointments.

## 2015-09-12 ENCOUNTER — Other Ambulatory Visit: Payer: Self-pay | Admitting: Internal Medicine

## 2015-09-14 ENCOUNTER — Ambulatory Visit: Payer: Self-pay

## 2015-09-15 ENCOUNTER — Ambulatory Visit (HOSPITAL_COMMUNITY)
Admission: RE | Admit: 2015-09-15 | Discharge: 2015-09-15 | Disposition: A | Discharge status: Home / Self Care | Payer: Medicare Other | Source: Ambulatory Visit | Attending: Cardiology | Admitting: Cardiology

## 2015-09-15 ENCOUNTER — Other Ambulatory Visit: Payer: Self-pay | Admitting: Nephrology

## 2015-09-15 ENCOUNTER — Other Ambulatory Visit: Payer: Self-pay | Admitting: Cardiovascular Disease

## 2015-09-15 DIAGNOSIS — I701 Atherosclerosis of renal artery: Secondary | ICD-10-CM | POA: Insufficient documentation

## 2015-09-15 DIAGNOSIS — N184 Chronic kidney disease, stage 4 (severe): Secondary | ICD-10-CM

## 2015-09-15 DIAGNOSIS — R011 Cardiac murmur, unspecified: Secondary | ICD-10-CM | POA: Diagnosis not present

## 2015-09-15 DIAGNOSIS — N189 Chronic kidney disease, unspecified: Secondary | ICD-10-CM | POA: Insufficient documentation

## 2015-09-15 DIAGNOSIS — E785 Hyperlipidemia, unspecified: Secondary | ICD-10-CM | POA: Insufficient documentation

## 2015-09-15 DIAGNOSIS — I159 Secondary hypertension, unspecified: Secondary | ICD-10-CM | POA: Diagnosis not present

## 2015-09-15 DIAGNOSIS — I129 Hypertensive chronic kidney disease with stage 1 through stage 4 chronic kidney disease, or unspecified chronic kidney disease: Secondary | ICD-10-CM | POA: Insufficient documentation

## 2015-09-15 DIAGNOSIS — I10 Essential (primary) hypertension: Secondary | ICD-10-CM | POA: Diagnosis present

## 2015-09-19 ENCOUNTER — Telehealth: Payer: Self-pay

## 2015-09-19 DIAGNOSIS — I159 Secondary hypertension, unspecified: Secondary | ICD-10-CM

## 2015-09-19 DIAGNOSIS — N184 Chronic kidney disease, stage 4 (severe): Secondary | ICD-10-CM | POA: Diagnosis not present

## 2015-09-19 NOTE — Telephone Encounter (Signed)
-----   Message from [name], MD sent at 09/18/2015  3:43 PM EDT ----- No change from prior study. Repeat in 12 months.

## 2015-09-20 ENCOUNTER — Ambulatory Visit
Admission: RE | Admit: 2015-09-20 | Discharge: 2015-09-20 | Disposition: A | Discharge status: Home / Self Care | Payer: Medicare Other | Source: Ambulatory Visit | Attending: Nephrology | Admitting: Nephrology

## 2015-09-20 DIAGNOSIS — N184 Chronic kidney disease, stage 4 (severe): Secondary | ICD-10-CM

## 2015-10-13 DIAGNOSIS — I129 Hypertensive chronic kidney disease with stage 1 through stage 4 chronic kidney disease, or unspecified chronic kidney disease: Secondary | ICD-10-CM | POA: Diagnosis not present

## 2015-10-13 DIAGNOSIS — E875 Hyperkalemia: Secondary | ICD-10-CM | POA: Diagnosis not present

## 2015-10-13 DIAGNOSIS — N184 Chronic kidney disease, stage 4 (severe): Secondary | ICD-10-CM | POA: Diagnosis not present

## 2015-10-14 ENCOUNTER — Encounter: Payer: Self-pay | Admitting: Cardiovascular Disease

## 2015-10-14 ENCOUNTER — Ambulatory Visit (INDEPENDENT_AMBULATORY_CARE_PROVIDER_SITE_OTHER): Payer: Medicare Other | Admitting: Cardiovascular Disease

## 2015-10-14 VITALS — BP 138/62 | HR 46 | Ht 75.0 in | Wt 205.1 lb

## 2015-10-14 DIAGNOSIS — E785 Hyperlipidemia, unspecified: Secondary | ICD-10-CM

## 2015-10-14 DIAGNOSIS — I779 Disorder of arteries and arterioles, unspecified: Secondary | ICD-10-CM

## 2015-10-14 DIAGNOSIS — I10 Essential (primary) hypertension: Secondary | ICD-10-CM

## 2015-10-14 DIAGNOSIS — I714 Abdominal aortic aneurysm, without rupture, unspecified: Secondary | ICD-10-CM

## 2015-10-14 DIAGNOSIS — I739 Peripheral vascular disease, unspecified: Principal | ICD-10-CM

## 2015-10-14 NOTE — Assessment & Plan Note (Signed)
History of hypertension with blood pressure measurements at 138/62. He is on amlodipine. Continue current meds at current dosing

## 2015-10-14 NOTE — Assessment & Plan Note (Signed)
History of CAD status post LAD diagonal branch stenting by Dr.Little in 2006, 7 and 8. His last catheterization 06/09/09 by Dr. [name] revealed patent stents after a false positive Myoview. He denies chest pain or shortness of breath.

## 2015-10-14 NOTE — Patient Instructions (Signed)
Medication Instructions:  Your physician recommends that you continue on your current medications as directed. Please refer to the Current Medication list given to you today.   Labwork: none  Testing/Procedures: Your physician has requested that you have a carotid duplex. This test is an ultrasound of the carotid arteries in your neck. It looks at blood flow through these arteries that supply the brain with blood. Allow one hour for this exam. There are no restrictions or special instructions. July 2017  Your physician has requested that you have a renal artery duplex. During this test, an ultrasound is used to evaluate blood flow to the kidneys. Allow one hour for this exam. Do not eat after midnight the day before and avoid carbonated beverages. Take your medications as you usually do. July 2017    Follow-Up: Your physician wants you to follow-up in: 12 months with Dr. [name]. You will receive a reminder letter in the mail two months in advance. If you don't receive a letter, please call our office to schedule the follow-up appointment.   Any Other Special Instructions Will Be Listed Below (If Applicable).     If you need a refill on your cardiac medications before your next appointment, please call your pharmacy.

## 2015-10-14 NOTE — Assessment & Plan Note (Signed)
History of a small abdominal aortic aneurysm last checked 10/26/14 measuring 3.4 x 3.5 cm. We will follow this for annual basis.

## 2015-10-14 NOTE — Assessment & Plan Note (Addendum)
History of hyperlipidemia on statin therapy with recent lipid profile performed 08/18/15 revealed total cholesterol 130, LDL 77 and HDL of 30

## 2015-10-14 NOTE — Progress Notes (Signed)
10/14/2015 [name]   01-31-1941  [id number]  Primary Physician MCKEOWN,WILLIAM DAVID, MD Primary Cardiologist: [name] MD [name]   HPI:  [name] is a 75 year old moderately overweight married Caucasian male father of one child formally a patient of Dr. Carlean Jews. Little's. I last saw him 12/21/14.[name] He has a history of CAD status post LAD and diagonal branch stenting back in 2006, 2007 and 2008. He underwent cardiac catheterization by Dr. Nona Dell 06/09/09 revealing widely patent stents after a false positive Myoview. His other problems include chronic branch block, treated hypertension and hyperlipidemia. He denies chest pain or shortness of breath. He does have moderate carotid disease with, duplex ultrasound. He is neurologically asymptomatic on aspirin and Plavix. Since I saw him a year ago he remains completely stable   Current Outpatient Prescriptions  Medication Sig Dispense Refill  . allopurinol (ZYLOPRIM) 300 MG tablet Take 300 mg by mouth daily.    [name] amLODipine (NORVASC) 5 MG tablet Take 5 mg by mouth daily.    [name] aspirin EC 81 MG tablet Take 81 mg by mouth daily.    . brimonidine (ALPHAGAN) 0.15 % ophthalmic solution Place 1 drop into both eyes 2 (two) times daily.    . Cholecalciferol (VITAMIN D3) 5000 UNITS TABS Take 5,000 Units by mouth daily.    . clopidogrel (PLAVIX) 75 MG tablet TAKE ONE TABLET BY MOUTH ONCE DAILY FOR HEART STENTS 90 tablet 2  . dorzolamide-timolol (COSOPT) 22.3-6.8 MG/ML ophthalmic solution Place 1 drop into both eyes 2 (two) times daily.    . fenofibrate micronized (LOFIBRA) 134 MG capsule TAKE ONE CAPSULE BY MOUTH ONCE DAILY 30 capsule 0  . furosemide (LASIX) 20 MG tablet Take 1 tablet (20 mg total) by mouth daily. 30 tablet 11  . nepafenac (ILEVRO) 0.3 % ophthalmic suspension Place 1 drop into the right eye daily.    . pravastatin (PRAVACHOL) 40 MG tablet TAKE ONE TABLET BY MOUTH AT BEDTIME FOR CHOLESTEROL. 90 tablet 1    No current facility-administered medications for this visit.    Allergies  Allergen Reactions  . Toprol Xl [Metoprolol Tartrate] Other (See Comments)    Bradycardia with beta blockers  . Lipitor [Atorvastatin]     Myalgias   . Coumadin [Warfarin Sodium] Rash  . Tape Rash    Clear tape  . Warfarin Rash    Social History   Social History  . Marital Status: Married    Spouse Name: N/A  . Number of Children: N/A  . Years of Education: N/A   Occupational History  . Not on file.   Social History Main Topics  . Smoking status: Never Smoker   . Smokeless tobacco: Never Used  . Alcohol Use: No  . Drug Use: No  . Sexual Activity: Not on file   Other Topics Concern  . Not on file   Social History Narrative     Review of Systems: General: negative for chills, fever, night sweats or weight changes.  Cardiovascular: negative for chest pain, dyspnea on exertion, edema, orthopnea, palpitations, paroxysmal nocturnal dyspnea or shortness of breath Dermatological: negative for rash Respiratory: negative for cough or wheezing Urologic: negative for hematuria Abdominal: negative for nausea, vomiting, diarrhea, bright red blood per rectum, melena, or hematemesis Neurologic: negative for visual changes, syncope, or dizziness All other systems reviewed and are otherwise negative except as noted above.    Blood pressure 138/62, pulse 46, height 6\' 3"  (1.905 m), weight 205 lb 2  oz (93.044 kg).  General appearance: alert and no distress Neck: no adenopathy, no JVD, supple, symmetrical, trachea midline, thyroid not enlarged, symmetric, no tenderness/mass/nodules and soft bilateral carotid bruits Lungs: clear to auscultation bilaterally Heart: regular rate and rhythm, S1, S2 normal, no murmur, click, rub or gallop Extremities: extremities normal, atraumatic, no cyanosis or edema  EKG not performed today  ASSESSMENT AND PLAN:   ASCAD History of CAD status post LAD diagonal  branch stenting by Dr.Little in 2006, 7 and 8. His last catheterization 06/09/09 by Dr. [name] revealed patent stents after a false positive Myoview. He denies chest pain or shortness of breath.  Carotid artery disease History of moderate bilateral ICA stenosis and was treated by duplex ultrasound last checked in July of last year. This will be checked on an annual basis.  Hyperlipidemia History of hyperlipidemia on statin therapy with recent lipid profile performed 08/18/15 revealed total cholesterol 130, LDL 77 and HDL of 30  Essential hypertension History of hypertension with blood pressure measurements at 138/62. He is on amlodipine. Continue current meds at current dosing  Abdominal aortic aneurysm History of a small abdominal aortic aneurysm last checked 10/26/14 measuring 3.4 x 3.5 cm. We will follow this for annual basis.      [name] MD FACP,FACC,FAHA, [facility] 10/14/2015 9:15 AM

## 2015-10-14 NOTE — Assessment & Plan Note (Signed)
History of moderate bilateral ICA stenosis and was treated by duplex ultrasound last checked in July of last year. This will be checked on an annual basis.

## 2015-10-18 ENCOUNTER — Encounter (INDEPENDENT_AMBULATORY_CARE_PROVIDER_SITE_OTHER): Payer: Medicare Other | Admitting: Ophthalmology

## 2015-10-18 DIAGNOSIS — H59031 Cystoid macular edema following cataract surgery, right eye: Secondary | ICD-10-CM

## 2015-10-18 DIAGNOSIS — H43813 Vitreous degeneration, bilateral: Secondary | ICD-10-CM

## 2015-10-18 DIAGNOSIS — H353121 Nonexudative age-related macular degeneration, left eye, early dry stage: Secondary | ICD-10-CM

## 2015-10-18 DIAGNOSIS — H34812 Central retinal vein occlusion, left eye, with macular edema: Secondary | ICD-10-CM

## 2015-10-18 DIAGNOSIS — I10 Essential (primary) hypertension: Secondary | ICD-10-CM

## 2015-10-18 DIAGNOSIS — H35033 Hypertensive retinopathy, bilateral: Secondary | ICD-10-CM | POA: Diagnosis not present

## 2015-11-03 ENCOUNTER — Encounter: Payer: Self-pay | Admitting: Internal Medicine

## 2015-11-07 ENCOUNTER — Ambulatory Visit (INDEPENDENT_AMBULATORY_CARE_PROVIDER_SITE_OTHER): Payer: Medicare Other | Admitting: Ophthalmology

## 2015-11-11 DIAGNOSIS — H353123 Nonexudative age-related macular degeneration, left eye, advanced atrophic without subfoveal involvement: Secondary | ICD-10-CM | POA: Diagnosis not present

## 2015-11-11 DIAGNOSIS — H353212 Exudative age-related macular degeneration, right eye, with inactive choroidal neovascularization: Secondary | ICD-10-CM | POA: Diagnosis not present

## 2015-11-11 DIAGNOSIS — Z961 Presence of intraocular lens: Secondary | ICD-10-CM | POA: Diagnosis not present

## 2015-11-11 DIAGNOSIS — H40013 Open angle with borderline findings, low risk, bilateral: Secondary | ICD-10-CM | POA: Diagnosis not present

## 2015-11-22 ENCOUNTER — Ambulatory Visit (INDEPENDENT_AMBULATORY_CARE_PROVIDER_SITE_OTHER): Payer: Medicare Other | Admitting: Internal Medicine

## 2015-11-22 ENCOUNTER — Encounter: Payer: Self-pay | Admitting: Internal Medicine

## 2015-11-22 VITALS — BP 148/78 | HR 56 | Temp 97.3°F | Resp 16 | Ht 73.5 in | Wt 203.6 lb

## 2015-11-22 DIAGNOSIS — R7309 Other abnormal glucose: Secondary | ICD-10-CM | POA: Diagnosis not present

## 2015-11-22 DIAGNOSIS — Z1212 Encounter for screening for malignant neoplasm of rectum: Secondary | ICD-10-CM

## 2015-11-22 DIAGNOSIS — Z0000 Encounter for general adult medical examination without abnormal findings: Secondary | ICD-10-CM | POA: Diagnosis not present

## 2015-11-22 DIAGNOSIS — Z79899 Other long term (current) drug therapy: Secondary | ICD-10-CM | POA: Diagnosis not present

## 2015-11-22 DIAGNOSIS — I10 Essential (primary) hypertension: Secondary | ICD-10-CM | POA: Diagnosis not present

## 2015-11-22 DIAGNOSIS — R7303 Prediabetes: Secondary | ICD-10-CM

## 2015-11-22 DIAGNOSIS — I25709 Atherosclerosis of coronary artery bypass graft(s), unspecified, with unspecified angina pectoris: Secondary | ICD-10-CM

## 2015-11-22 DIAGNOSIS — M1000 Idiopathic gout, unspecified site: Secondary | ICD-10-CM | POA: Diagnosis not present

## 2015-11-22 DIAGNOSIS — E785 Hyperlipidemia, unspecified: Secondary | ICD-10-CM | POA: Diagnosis not present

## 2015-11-22 DIAGNOSIS — Z136 Encounter for screening for cardiovascular disorders: Secondary | ICD-10-CM | POA: Diagnosis not present

## 2015-11-22 DIAGNOSIS — Z0001 Encounter for general adult medical examination with abnormal findings: Secondary | ICD-10-CM

## 2015-11-22 DIAGNOSIS — M109 Gout, unspecified: Secondary | ICD-10-CM | POA: Diagnosis not present

## 2015-11-22 DIAGNOSIS — Z125 Encounter for screening for malignant neoplasm of prostate: Secondary | ICD-10-CM

## 2015-11-22 DIAGNOSIS — E559 Vitamin D deficiency, unspecified: Secondary | ICD-10-CM

## 2015-11-22 LAB — LIPID PANEL
CHOL/HDL RATIO: 5 ratio (ref <=5.0)
CHOLESTEROL: 139 mg/dL (ref 125–200)
HDL: 28 mg/dL — ABNORMAL LOW (ref >=40)
LDL Cholesterol: 83 mg/dL (ref <130)
Triglycerides: 141 mg/dL (ref <150)
VLDL: 28 mg/dL (ref <30)

## 2015-11-22 LAB — BASIC METABOLIC PANEL WITH GFR
BUN: 38 mg/dL — AB (ref 7–25)
CALCIUM: 9.3 mg/dL (ref 8.6–10.3)
CHLORIDE: 108 mmol/L (ref 98–110)
CO2: 26 mmol/L (ref 20–31)
CREATININE: 2.82 mg/dL — AB (ref 0.70–1.18)
GFR, Est African American: 24 mL/min — ABNORMAL LOW (ref >=60)
GFR, Est Non African American: 21 mL/min — ABNORMAL LOW (ref >=60)
GLUCOSE: 84 mg/dL (ref 65–99)
Potassium: 4.3 mmol/L (ref 3.5–5.3)
Sodium: 143 mmol/L (ref 135–146)

## 2015-11-22 LAB — MAGNESIUM: MAGNESIUM: 2.1 mg/dL (ref 1.5–2.5)

## 2015-11-22 LAB — CBC WITH DIFFERENTIAL/PLATELET
BASOS PCT: 1 %
Basophils Absolute: 60 cells/uL (ref 0–200)
EOS ABS: 360 {cells}/uL (ref 15–500)
Eosinophils Relative: 6 %
HEMATOCRIT: 32.3 % — AB (ref 38.5–50.0)
Hemoglobin: 10.4 g/dL — ABNORMAL LOW (ref 13.2–17.1)
Lymphocytes Relative: 15 %
Lymphs Abs: 900 cells/uL (ref 850–3900)
MCH: 29.3 pg (ref 27.0–33.0)
MCHC: 32.2 g/dL (ref 32.0–36.0)
MCV: 91 fL (ref 80.0–100.0)
MONO ABS: 300 {cells}/uL (ref 200–950)
MONOS PCT: 5 %
MPV: 9.2 fL (ref 7.5–12.5)
NEUTROS ABS: 4380 {cells}/uL (ref 1500–7800)
Neutrophils Relative %: 73 %
PLATELETS: 245 10*3/uL (ref 140–400)
RBC: 3.55 MIL/uL — ABNORMAL LOW (ref 4.20–5.80)
RDW: 14.9 % (ref 11.0–15.0)
WBC: 6 10*3/uL (ref 3.8–10.8)

## 2015-11-22 LAB — HEPATIC FUNCTION PANEL
ALBUMIN: 3.8 g/dL (ref 3.6–5.1)
ALT: 12 U/L (ref 9–46)
AST: 25 U/L (ref 10–35)
Alkaline Phosphatase: 41 U/L (ref 40–115)
BILIRUBIN DIRECT: 0.2 mg/dL (ref <=0.2)
Indirect Bilirubin: 0.4 mg/dL (ref 0.2–1.2)
TOTAL PROTEIN: 5.9 g/dL — AB (ref 6.1–8.1)
Total Bilirubin: 0.6 mg/dL (ref 0.2–1.2)

## 2015-11-22 LAB — URIC ACID: Uric Acid, Serum: 7.1 mg/dL (ref 4.0–7.8)

## 2015-11-22 LAB — HEMOGLOBIN A1C
HEMOGLOBIN A1C: 5.3 % (ref <5.7)
MEAN PLASMA GLUCOSE: 105 mg/dL

## 2015-11-22 LAB — TSH: TSH: 2.68 m[IU]/L (ref 0.40–4.50)

## 2015-11-22 NOTE — Patient Instructions (Signed)

## 2015-11-22 NOTE — Progress Notes (Signed)
Patient ID: Terry Garner, male   DOB: 1941/01/27, 75 y.o.   MRN: [id number]  [facility] ADULT & ADOLESCENT INTERNAL MEDICINE   [name], M.D.    Uvaldo Bristle. Silverio Lay, P.A.-C      Starlyn Skeans, P.A.-C   [facility]                [address] [place], [place] [id number] Telephone [phone] Telefax [phone] _________________________________  Annual  Screening/Preventative Visit And Comprehensive Evaluation & Examination     This very nice 75 y.o. MWM presents for a Wellness/Preventative Visit & comprehensive evaluation and management of multiple medical co-morbidities.  Patient has been followed for HTN, Prediabetes, Hyperlipidemia and Vitamin D Deficiency.Patient does seem to have some difficulty assimilating & comprehending information & advice he is given by various medical providers to the point of misinterpreting information.       HTN predates since  1998. Patient's BP has been controlled at home.Today's BP is 148/78. Patient also has ASCAD and had PCA with stents in 2006, 2007 and 2008 by Dr [name].  Patient denies any cardiac symptoms as chest pain, palpitations, shortness of breath, dizziness or ankle swelling. 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 Patient denies myalgias or other medication SE's. Last lipids were Cholesterol 130; HDL 30*; LDL 77; Triglycerides 117 on 08/18/2015.      Patient has prediabetes since July 2013 with A1c 6.0%  and patient denies reactive hypoglycemic symptoms, visual blurring, diabetic polys or paresthesias. Last A1c was  5.4% on  08/18/2015.     Finally, patient has history of Vitamin D Deficiency of "87" in 2008 and last vitamin D was still low at 48 on 08/18/2015.  Medication Sig  . allopurinol (ZYLOPRIM) 300 MG tablet Take 300 mg by mouth daily.  [name] amLODipine  (NORVASC) 5 MG tablet Take 5 mg by mouth daily.  [name] aspirin EC 81 MG tablet Take 81 mg by mouth daily.  . brimonidine (ALPHAGAN) 0.15 % ophthalmic solution Place 1 drop into both eyes 2 (two) times daily.  . Cholecalciferol (VITAMIN D3) 5000 UNITS TABS Take 5,000 Units by mouth daily.  . clopidogrel (PLAVIX) 75 MG tablet TAKE ONE TABLET BY MOUTH ONCE DAILY FOR HEART STENTS  . dorzolamide-timolol (COSOPT) 22.3-6.8 MG/ML ophthalmic solution Place 1 drop into both eyes 2 (two) times daily.  . fenofibrate micronized (LOFIBRA) 134 MG capsule TAKE ONE CAPSULE BY MOUTH ONCE DAILY  . furosemide (LASIX) 20 MG tablet Take 1 tablet (20 mg total) by mouth daily.  . nepafenac (ILEVRO) 0.3 % ophthalmic suspension Place 1 drop into the right eye daily.  . pravastatin (PRAVACHOL) 40 MG tablet TAKE ONE TABLET BY MOUTH AT BEDTIME FOR CHOLESTEROL.   Allergies  Allergen Reactions  . Toprol Xl [Metoprolol Tartrate] Other (See Comments)    Bradycardia with beta blockers  . Lipitor [Atorvastatin]     Myalgias   . Coumadin [Warfarin Sodium] Rash  . Tape Rash    Clear tape  . Warfarin Rash   Past Medical History  Diagnosis Date  . Hypertension   . Heart murmur   . Chronic kidney disease     CKD, right renal artery stenosis  . AAA (abdominal aortic aneurysm) ([place])  3.6 cm (01/09/12 ultrasound)  . Carotid artery disease ([place])   . Coronary artery disease     mild left internal carotid artery stenosis  . Arthritis   . Gout   . Vitamin D deficiency   . Tubular adenoma of colon 2003    Dr. Watt Climes  . Hyperlipidemia   . Allergy   . Glaucoma   . Cataract    Health Maintenance  Topic Date Due  . ZOSTAVAX  11/01/1998  . INFLUENZA VACCINE  01/24/2016  . COLONOSCOPY  12/05/2018  . TETANUS/TDAP  08/28/2022  . PNA vac Low Risk Adult  Completed   Immunization History  Administered Date(s) Administered  . Influenza Split 04/09/2013  . Influenza, High Dose Seasonal PF 03/12/2014, 03/16/2015  .  Pneumococcal Conjugate-13 03/12/2014  . Pneumococcal-Unspecified 06/25/2005  . Td 06/25/2002   Past Surgical History  Procedure Laterality Date  . Cardiac catheterization      stents  last -07  . Hernia repair Right 50  . Total hip arthroplasty Left   . Shoulder arthroscopy Right   . Cervical disc surgery    . Cardiac stents    . Lumbar laminectomy  06/06/2012    Procedure: MICRODISCECTOMY;   Marybelle Killings, MD;    Left L4-5 Microdiscectomy  . Back surgery    . Eye surgery Left 06    retinal detachment   Social History   Social History  . Marital Status: Married    Spouse Name: N/A  . Number of Children: N/A  . Years of Education: N/A   Occupational History  . Retired Armed forces training and education officer   Social History Main Topics  . Smoking status: Never Smoker   . Smokeless tobacco: Never Used  . Alcohol Use: No  . Drug Use: No  . Sexual Activity: Not on file    ROS Constitutional: Denies fever, chills, weight loss/gain, headaches, insomnia,  night sweats or change in appetite. Does c/o fatigue. Eyes: Denies redness, diplopia, discharge, itchy or watery eyes. Reports very poor vision to light in the L eye and door vision in the R eye ENT: Denies discharge, congestion, post nasal drip, epistaxis, sore throat, earache, hearing loss, dental pain, Tinnitus, Vertigo, Sinus pain or snoring.  Cardio: Denies chest pain, palpitations, irregular heartbeat, syncope, dyspnea, diaphoresis, orthopnea, PND, claudication or edema Respiratory: denies cough, dyspnea, DOE, pleurisy, hoarseness, laryngitis or wheezing.  Gastrointestinal: Denies dysphagia, heartburn, reflux, water brash, pain, cramps, nausea, vomiting, bloating, diarrhea, constipation, hematemesis, melena, hematochezia, jaundice or hemorrhoids Genitourinary: Denies dysuria, frequency, urgency, nocturia, hesitancy, discharge, hematuria or flank pain Musculoskeletal: Denies arthralgia, myalgia, stiffness, Jt. Swelling, pain, limp or  strain/sprain. Denies Falls. Skin: Denies puritis, rash, hives, warts, acne, eczema or change in skin lesion Neuro: No weakness, tremor, incoordination, spasms, paresthesia or pain Psychiatric: Denies confusion, memory loss or sensory loss. Denies Depression. Endocrine: Denies change in weight, skin, hair change, nocturia, and paresthesia, diabetic polys, visual blurring or hyper / hypo glycemic episodes.  Heme/Lymph: No excessive bleeding, bruising or enlarged lymph nodes.  Physical Exam  BP 148/78 mmHg  Pulse 56  Temp(Src) 97.3 F (36.3 C)  Resp 16  Ht 6' 1.5" (1.867 m)  Wt 203 lb 9.6 oz (92.352 kg)  BMI 26.49 kg/m2  General Appearance: Well nourished, in no apparent distress. Eyes: PERRLA, EOMs, conjunctiva no swelling or erythema, normal fundi and vessels. Sinuses: No frontal/maxillary tenderness ENT/Mouth: EACs patent / TMs  nl. Nares clear without erythema, swelling, mucoid exudates. Oral hygiene is good. No erythema,  swelling, or exudate. Tongue normal, non-obstructing. Tonsils not swollen or erythematous. Hearing normal.  Neck: Supple, thyroid normal. No bruits, nodes or JVD. Respiratory: Respiratory effort normal.  BS equal and clear bilateral without rales, rhonci, wheezing or stridor. Cardio: Heart sounds are normal with regular rate and rhythm and no murmurs, rubs or gallops. Peripheral pulses are normal and equal bilaterally without edema. No aortic or femoral bruits. Chest: symmetric with normal excursions and percussion.  Abdomen: Soft, with Nl bowel sounds. Nontender, no guarding, rebound, hernias, masses, or organomegaly.  Lymphatics: Non tender without lymphadenopathy.  Genitourinary: No hernias.Testes nl. DRE - prostate nl for age - smooth & firm w/o nodules. Musculoskeletal: Full ROM all peripheral extremities, joint stability, 5/5 strength, and normal gait. Skin: Warm and dry without rashes, lesions, cyanosis, clubbing or  ecchymosis.  Neuro: Cranial nerves intact,  reflexes equal bilaterally. Normal muscle tone, no cerebellar symptoms. Sensation intact.  Pysch: Alert and oriented X 3 with normal affect, insight and judgment appropriate.   Assessment and Plan  1. Annual Preventative/Screening Exam   2. Essential hypertension  - Microalbumin / creatinine urine ratio - EKG 12-Lead - [place], RETROPERITNL ABD,  LTD - TSH  3. Hyperlipidemia  - Lipid panel - TSH  4. Vitamin D deficiency  - Hemoglobin A1c - Insulin, random  5. Prediabetes  - VITAMIN D 25 Hydroxy   6. Idiopathic gout  - Uric acid  7. Coronary artery disease  - s/p PCA/Stents x 3  8. Screening for rectal cancer  - POC Hemoccult Bld/Stl   9. Prostate cancer screening  - PSA  10. Medication management  - Urinalysis, Routine w reflex microscopic  - CBC with Differential/Platelet - BASIC METABOLIC PANEL WITH GFR - Hepatic function panel - Magnesium   Continue prudent diet as discussed, weight control, BP monitoring, regular exercise, and medications as discussed.  Discussed med effects and SE's. Routine screening labs and tests as requested with regular follow-up as recommended. Over 40 minutes of exam, counseling, chart review and high complex critical decision making was performed

## 2015-11-23 LAB — PSA: PSA: 2.66 ng/mL (ref <=4.00)

## 2015-11-23 LAB — URINALYSIS, ROUTINE W REFLEX MICROSCOPIC
BILIRUBIN URINE: NEGATIVE
GLUCOSE, UA: NEGATIVE
Hgb urine dipstick: NEGATIVE
Ketones, ur: NEGATIVE
Leukocytes, UA: NEGATIVE
Nitrite: NEGATIVE
Protein, ur: NEGATIVE
SPECIFIC GRAVITY, URINE: 1.012 (ref 1.001–1.035)
pH: 6 (ref 5.0–8.0)

## 2015-11-23 LAB — INSULIN, RANDOM: Insulin: 5 u[IU]/mL (ref 2.0–19.6)

## 2015-11-23 LAB — MICROALBUMIN / CREATININE URINE RATIO
Creatinine, Urine: 71 mg/dL (ref 20–370)
Microalb Creat Ratio: 14 mcg/mg creat (ref <30)
Microalb, Ur: 1 mg/dL

## 2015-11-23 LAB — VITAMIN D 25 HYDROXY (VIT D DEFICIENCY, FRACTURES): VIT D 25 HYDROXY: 52 ng/mL (ref 30–100)

## 2015-11-30 ENCOUNTER — Encounter (INDEPENDENT_AMBULATORY_CARE_PROVIDER_SITE_OTHER): Payer: Medicare Other | Admitting: Ophthalmology

## 2015-11-30 DIAGNOSIS — H43813 Vitreous degeneration, bilateral: Secondary | ICD-10-CM

## 2015-11-30 DIAGNOSIS — H34812 Central retinal vein occlusion, left eye, with macular edema: Secondary | ICD-10-CM

## 2015-11-30 DIAGNOSIS — H35033 Hypertensive retinopathy, bilateral: Secondary | ICD-10-CM | POA: Diagnosis not present

## 2015-11-30 DIAGNOSIS — H35371 Puckering of macula, right eye: Secondary | ICD-10-CM

## 2015-11-30 DIAGNOSIS — H59031 Cystoid macular edema following cataract surgery, right eye: Secondary | ICD-10-CM | POA: Diagnosis not present

## 2015-11-30 DIAGNOSIS — I10 Essential (primary) hypertension: Secondary | ICD-10-CM

## 2015-11-30 DIAGNOSIS — L570 Actinic keratosis: Secondary | ICD-10-CM | POA: Diagnosis not present

## 2015-11-30 DIAGNOSIS — L219 Seborrheic dermatitis, unspecified: Secondary | ICD-10-CM | POA: Diagnosis not present

## 2015-12-17 ENCOUNTER — Other Ambulatory Visit: Payer: Self-pay | Admitting: Internal Medicine

## 2016-01-02 DIAGNOSIS — N184 Chronic kidney disease, stage 4 (severe): Secondary | ICD-10-CM | POA: Diagnosis not present

## 2016-01-02 DIAGNOSIS — E875 Hyperkalemia: Secondary | ICD-10-CM | POA: Diagnosis not present

## 2016-01-02 DIAGNOSIS — I129 Hypertensive chronic kidney disease with stage 1 through stage 4 chronic kidney disease, or unspecified chronic kidney disease: Secondary | ICD-10-CM | POA: Diagnosis not present

## 2016-01-09 ENCOUNTER — Other Ambulatory Visit: Payer: Self-pay | Admitting: *Deleted

## 2016-01-09 DIAGNOSIS — Z1212 Encounter for screening for malignant neoplasm of rectum: Secondary | ICD-10-CM

## 2016-01-09 LAB — POC HEMOCCULT BLD/STL (HOME/3-CARD/SCREEN)
Card #2 Fecal Occult Blod, POC: NEGATIVE
FECAL OCCULT BLD: NEGATIVE
Fecal Occult Blood, POC: NEGATIVE

## 2016-01-30 ENCOUNTER — Encounter (INDEPENDENT_AMBULATORY_CARE_PROVIDER_SITE_OTHER): Payer: Medicare Other | Admitting: Ophthalmology

## 2016-01-30 DIAGNOSIS — H35033 Hypertensive retinopathy, bilateral: Secondary | ICD-10-CM | POA: Diagnosis not present

## 2016-01-30 DIAGNOSIS — I10 Essential (primary) hypertension: Secondary | ICD-10-CM

## 2016-01-30 DIAGNOSIS — H34812 Central retinal vein occlusion, left eye, with macular edema: Secondary | ICD-10-CM

## 2016-01-30 DIAGNOSIS — H35371 Puckering of macula, right eye: Secondary | ICD-10-CM | POA: Diagnosis not present

## 2016-01-30 DIAGNOSIS — H43813 Vitreous degeneration, bilateral: Secondary | ICD-10-CM

## 2016-01-30 DIAGNOSIS — H59031 Cystoid macular edema following cataract surgery, right eye: Secondary | ICD-10-CM | POA: Diagnosis not present

## 2016-03-01 DIAGNOSIS — E875 Hyperkalemia: Secondary | ICD-10-CM | POA: Diagnosis not present

## 2016-03-01 DIAGNOSIS — I129 Hypertensive chronic kidney disease with stage 1 through stage 4 chronic kidney disease, or unspecified chronic kidney disease: Secondary | ICD-10-CM | POA: Diagnosis not present

## 2016-03-01 DIAGNOSIS — N184 Chronic kidney disease, stage 4 (severe): Secondary | ICD-10-CM | POA: Diagnosis not present

## 2016-03-27 ENCOUNTER — Other Ambulatory Visit: Payer: Self-pay | Admitting: Internal Medicine

## 2016-03-28 ENCOUNTER — Ambulatory Visit (INDEPENDENT_AMBULATORY_CARE_PROVIDER_SITE_OTHER): Payer: Medicare Other

## 2016-03-28 ENCOUNTER — Other Ambulatory Visit: Payer: Self-pay | Admitting: *Deleted

## 2016-03-28 DIAGNOSIS — Z23 Encounter for immunization: Secondary | ICD-10-CM

## 2016-03-28 MED ORDER — PRAVASTATIN SODIUM 40 MG PO TABS: ORAL_TABLET | ORAL | 1 refills | Status: DC

## 2016-05-01 ENCOUNTER — Encounter (INDEPENDENT_AMBULATORY_CARE_PROVIDER_SITE_OTHER): Payer: Medicare Other | Admitting: Ophthalmology

## 2016-05-03 DIAGNOSIS — I129 Hypertensive chronic kidney disease with stage 1 through stage 4 chronic kidney disease, or unspecified chronic kidney disease: Secondary | ICD-10-CM | POA: Diagnosis not present

## 2016-05-03 DIAGNOSIS — E875 Hyperkalemia: Secondary | ICD-10-CM | POA: Diagnosis not present

## 2016-05-03 DIAGNOSIS — N184 Chronic kidney disease, stage 4 (severe): Secondary | ICD-10-CM | POA: Diagnosis not present

## 2016-05-08 ENCOUNTER — Encounter: Payer: Self-pay | Admitting: Internal Medicine

## 2016-05-08 ENCOUNTER — Ambulatory Visit (INDEPENDENT_AMBULATORY_CARE_PROVIDER_SITE_OTHER): Payer: Medicare Other | Admitting: Internal Medicine

## 2016-05-08 VITALS — BP 140/70 | HR 56 | Temp 97.1°F | Resp 16 | Wt 201.0 lb

## 2016-05-08 DIAGNOSIS — N184 Chronic kidney disease, stage 4 (severe): Secondary | ICD-10-CM | POA: Diagnosis not present

## 2016-05-08 DIAGNOSIS — I10 Essential (primary) hypertension: Secondary | ICD-10-CM

## 2016-05-08 DIAGNOSIS — Z79899 Other long term (current) drug therapy: Secondary | ICD-10-CM

## 2016-05-08 DIAGNOSIS — R7303 Prediabetes: Secondary | ICD-10-CM | POA: Diagnosis not present

## 2016-05-08 DIAGNOSIS — E559 Vitamin D deficiency, unspecified: Secondary | ICD-10-CM

## 2016-05-08 DIAGNOSIS — E782 Mixed hyperlipidemia: Secondary | ICD-10-CM

## 2016-05-08 NOTE — Progress Notes (Signed)
Assessment and Plan:  Hypertension:  -keep BP less than 130/80 -Continue medication,  -monitor blood pressure at home.  -Continue DASH diet.   -Reminder to go to the ER if any CP, SOB, nausea, dizziness, severe HA, changes vision/speech, left arm numbness and tingling, and jaw pain.  Cholesterol: -Continue diet and exercise.  -Check cholesterol.   Pre-diabetes: -Continue diet and exercise.  -Check A1C  Vitamin D Def: -check level -continue medications.   Chronic kidney disease Stage 4 -followed by Dr. Mercy Moore -just had CMET drawn on Friday -will view labs from this visit -patient is also seeing cardiology who is monitoring Lipids and HTN as well   Continue diet and meds as discussed. Further disposition pending results of labs.  HPI 75 y.o. male  presents for 3 month follow up with hypertension, hyperlipidemia, prediabetes and vitamin D.   His blood pressure has been controlled at home, today their BP is BP: 140/70.   He does not workout. He denies chest pain, shortness of breath, dizziness.   He is not on cholesterol medication and denies myalgias. His cholesterol is at goal. The cholesterol last visit was:   Lab Results  Component Value Date   CHOL 139 11/22/2015   HDL 28 (L) 11/22/2015   LDLCALC 83 11/22/2015   TRIG 141 11/22/2015   CHOLHDL 5.0 11/22/2015     He has been working on diet and exercise for prediabetes, and denies foot ulcerations, hyperglycemia, hypoglycemia , increased appetite, nausea, paresthesia of the feet, polydipsia, polyuria, visual disturbances and vomiting. Last A1C in the office was:  Lab Results  Component Value Date   HGBA1C 5.3 11/22/2015    Patient is on Vitamin D supplement.  Lab Results  Component Value Date   VD25OH 30 11/22/2015     He has been following with Dr. Mercy Moore for his kidney function and GFR.  He is taking lasix for this.  He reports that he is pretty pleased with the improvement of his kidney functions.  He  reports that things were looking better.    Current Medications:  Current Outpatient Prescriptions on File Prior to Visit  Medication Sig Dispense Refill  . allopurinol (ZYLOPRIM) 300 MG tablet Take 300 mg by mouth daily.    [name] amLODipine (NORVASC) 5 MG tablet Take 5 mg by mouth daily.    [name] aspirin EC 81 MG tablet Take 81 mg by mouth daily.    . brimonidine (ALPHAGAN) 0.15 % ophthalmic solution Place 1 drop into both eyes 2 (two) times daily.    . Cholecalciferol (VITAMIN D3) 5000 UNITS TABS Take 5,000 Units by mouth daily.    . clopidogrel (PLAVIX) 75 MG tablet TAKE ONE TABLET BY MOUTH ONCE DAILY FOR HEART STENTS 90 tablet 1  . dorzolamide-timolol (COSOPT) 22.3-6.8 MG/ML ophthalmic solution Place 1 drop into both eyes 2 (two) times daily.    . fenofibrate micronized (LOFIBRA) 134 MG capsule TAKE ONE CAPSULE BY MOUTH ONCE DAILY 30 capsule 0  . fenofibrate micronized (LOFIBRA) 134 MG capsule TAKE ONE CAPSULE BY MOUTH ONCE DAILY. 90 capsule 1  . furosemide (LASIX) 20 MG tablet Take 1 tablet (20 mg total) by mouth daily. 30 tablet 11  . loteprednol (LOTEMAX) 0.5 % ophthalmic suspension Place 1 drop into the right eye 3 (three) times daily.    . nepafenac (ILEVRO) 0.3 % ophthalmic suspension Place 1 drop into the right eye daily.    . pravastatin (PRAVACHOL) 40 MG tablet TAKE ONE TABLET BY MOUTH AT BEDTIME FOR  CHOLESTEROL. 90 tablet 1   No current facility-administered medications on file prior to visit.     Medical History:  Past Medical History:  Diagnosis Date  . AAA (abdominal aortic aneurysm) ([facility])    3.6 cm (01/09/12 ultrasound)  . Allergy   . Arthritis   . Carotid artery disease ([place])   . Cataract   . Chronic kidney disease    CKD, right renal artery stenosis  . Coronary artery disease    mild left internal carotid artery stenosis  . Glaucoma   . Gout   . Heart murmur   . Hyperlipidemia   . Hypertension   . Tubular adenoma of colon 2003   Dr. Watt Climes  . Vitamin D deficiency      Allergies:  Allergies  Allergen Reactions  . Toprol Xl [Metoprolol Tartrate] Other (See Comments)    Bradycardia with beta blockers  . Lipitor [Atorvastatin]     Myalgias   . Coumadin [Warfarin Sodium] Rash  . Tape Rash    Clear tape  . Warfarin Rash     Review of Systems:  Review of Systems  Constitutional: Negative for chills, fever and malaise/fatigue.  HENT: Negative for congestion, ear pain and sore throat.   Eyes: Negative.   Respiratory: Negative for cough, shortness of breath and wheezing.   Cardiovascular: Negative for chest pain, palpitations and leg swelling.  Gastrointestinal: Negative for abdominal pain, blood in stool, constipation, diarrhea, heartburn and melena.  Genitourinary: Negative.   Skin: Negative.   Neurological: Negative for dizziness, sensory change, loss of consciousness and headaches.  Psychiatric/Behavioral: Negative for depression. The patient is not nervous/anxious and does not have insomnia.     Family history- Review and unchanged  Social history- Review and unchanged  Physical Exam: BP 140/70   Pulse (!) 56   Temp 97.1 F (36.2 C)   Resp 16   Wt 201 lb (91.2 kg)   SpO2 99%   BMI 26.16 kg/m  Wt Readings from Last 3 Encounters:  05/08/16 201 lb (91.2 kg)  11/22/15 203 lb 9.6 oz (92.4 kg)  10/14/15 205 lb 2 oz (93 kg)    General Appearance: Well nourished well developed, in no apparent distress. Eyes: PERRLA, EOMs, conjunctiva no swelling or erythema ENT/Mouth: Ear canals normal without obstruction, swelling, erythma, discharge.  TMs normal bilaterally.  Oropharynx moist, clear, without exudate, or postoropharyngeal swelling. Neck: Supple, thyroid normal,no cervical adenopathy  Respiratory: Respiratory effort normal, Breath sounds clear A&P without rhonchi, wheeze, or rale.  No retractions, no accessory usage. Cardio: RRR with no MRGs. Brisk peripheral pulses without edema.  Abdomen: Soft, + BS,  Non tender, no guarding,  rebound, hernias, masses. Musculoskeletal: Full ROM, 5/5 strength, Normal gait Skin: Warm, dry without rashes, lesions, ecchymosis.  Neuro: Awake and oriented X 3, Cranial nerves intact. Normal muscle tone, no cerebellar symptoms. Psych: Normal affect, Insight and Judgment appropriate.    Starlyn Skeans, PA-C 9:55 AM [facility]

## 2016-05-10 ENCOUNTER — Encounter (INDEPENDENT_AMBULATORY_CARE_PROVIDER_SITE_OTHER): Payer: Medicare Other | Admitting: Ophthalmology

## 2016-05-10 DIAGNOSIS — H43813 Vitreous degeneration, bilateral: Secondary | ICD-10-CM | POA: Diagnosis not present

## 2016-05-10 DIAGNOSIS — H59031 Cystoid macular edema following cataract surgery, right eye: Secondary | ICD-10-CM

## 2016-05-10 DIAGNOSIS — I10 Essential (primary) hypertension: Secondary | ICD-10-CM | POA: Diagnosis not present

## 2016-05-10 DIAGNOSIS — H35371 Puckering of macula, right eye: Secondary | ICD-10-CM | POA: Diagnosis not present

## 2016-05-10 DIAGNOSIS — H348122 Central retinal vein occlusion, left eye, stable: Secondary | ICD-10-CM

## 2016-05-10 DIAGNOSIS — H35033 Hypertensive retinopathy, bilateral: Secondary | ICD-10-CM

## 2016-05-24 DIAGNOSIS — H40013 Open angle with borderline findings, low risk, bilateral: Secondary | ICD-10-CM | POA: Diagnosis not present

## 2016-05-31 DIAGNOSIS — L570 Actinic keratosis: Secondary | ICD-10-CM | POA: Diagnosis not present

## 2016-06-26 ENCOUNTER — Other Ambulatory Visit: Payer: Self-pay | Admitting: Internal Medicine

## 2016-07-27 DIAGNOSIS — N184 Chronic kidney disease, stage 4 (severe): Secondary | ICD-10-CM | POA: Diagnosis not present

## 2016-07-27 DIAGNOSIS — I129 Hypertensive chronic kidney disease with stage 1 through stage 4 chronic kidney disease, or unspecified chronic kidney disease: Secondary | ICD-10-CM | POA: Diagnosis not present

## 2016-07-27 DIAGNOSIS — E875 Hyperkalemia: Secondary | ICD-10-CM | POA: Diagnosis not present

## 2016-08-17 DIAGNOSIS — D485 Neoplasm of uncertain behavior of skin: Secondary | ICD-10-CM | POA: Diagnosis not present

## 2016-08-17 DIAGNOSIS — L570 Actinic keratosis: Secondary | ICD-10-CM | POA: Diagnosis not present

## 2016-08-17 DIAGNOSIS — C4442 Squamous cell carcinoma of skin of scalp and neck: Secondary | ICD-10-CM | POA: Diagnosis not present

## 2016-08-22 ENCOUNTER — Encounter: Payer: Self-pay | Admitting: Internal Medicine

## 2016-08-22 ENCOUNTER — Ambulatory Visit (INDEPENDENT_AMBULATORY_CARE_PROVIDER_SITE_OTHER): Payer: Medicare Other | Admitting: Internal Medicine

## 2016-08-22 VITALS — BP 132/64 | HR 50 | Temp 97.8°F | Resp 16 | Ht 73.5 in | Wt 193.0 lb

## 2016-08-22 DIAGNOSIS — R7303 Prediabetes: Secondary | ICD-10-CM | POA: Diagnosis not present

## 2016-08-22 DIAGNOSIS — M5126 Other intervertebral disc displacement, lumbar region: Secondary | ICD-10-CM

## 2016-08-22 DIAGNOSIS — I25709 Atherosclerosis of coronary artery bypass graft(s), unspecified, with unspecified angina pectoris: Secondary | ICD-10-CM

## 2016-08-22 DIAGNOSIS — Z79899 Other long term (current) drug therapy: Secondary | ICD-10-CM | POA: Diagnosis not present

## 2016-08-22 DIAGNOSIS — D649 Anemia, unspecified: Secondary | ICD-10-CM

## 2016-08-22 DIAGNOSIS — I4510 Unspecified right bundle-branch block: Secondary | ICD-10-CM | POA: Diagnosis not present

## 2016-08-22 DIAGNOSIS — Z6826 Body mass index (BMI) 26.0-26.9, adult: Secondary | ICD-10-CM

## 2016-08-22 DIAGNOSIS — N184 Chronic kidney disease, stage 4 (severe): Secondary | ICD-10-CM | POA: Diagnosis not present

## 2016-08-22 DIAGNOSIS — I714 Abdominal aortic aneurysm, without rupture, unspecified: Secondary | ICD-10-CM

## 2016-08-22 DIAGNOSIS — I739 Peripheral vascular disease, unspecified: Secondary | ICD-10-CM

## 2016-08-22 DIAGNOSIS — M1000 Idiopathic gout, unspecified site: Secondary | ICD-10-CM

## 2016-08-22 DIAGNOSIS — Z0001 Encounter for general adult medical examination with abnormal findings: Secondary | ICD-10-CM | POA: Diagnosis not present

## 2016-08-22 DIAGNOSIS — E782 Mixed hyperlipidemia: Secondary | ICD-10-CM

## 2016-08-22 DIAGNOSIS — I779 Disorder of arteries and arterioles, unspecified: Secondary | ICD-10-CM | POA: Diagnosis not present

## 2016-08-22 DIAGNOSIS — R6889 Other general symptoms and signs: Secondary | ICD-10-CM | POA: Diagnosis not present

## 2016-08-22 DIAGNOSIS — E559 Vitamin D deficiency, unspecified: Secondary | ICD-10-CM

## 2016-08-22 DIAGNOSIS — I10 Essential (primary) hypertension: Secondary | ICD-10-CM

## 2016-08-22 DIAGNOSIS — C4442 Squamous cell carcinoma of skin of scalp and neck: Secondary | ICD-10-CM | POA: Diagnosis not present

## 2016-08-22 DIAGNOSIS — Z0000 Encounter for general adult medical examination without abnormal findings: Secondary | ICD-10-CM

## 2016-08-22 LAB — CBC WITH DIFFERENTIAL/PLATELET
BASOS PCT: 1 %
Basophils Absolute: 57 cells/uL (ref 0–200)
Eosinophils Absolute: 285 cells/uL (ref 15–500)
Eosinophils Relative: 5 %
HEMATOCRIT: 33.7 % — AB (ref 38.5–50.0)
HEMOGLOBIN: 11 g/dL — AB (ref 13.2–17.1)
LYMPHS ABS: 798 {cells}/uL — AB (ref 850–3900)
Lymphocytes Relative: 14 %
MCH: 29.5 pg (ref 27.0–33.0)
MCHC: 32.6 g/dL (ref 32.0–36.0)
MCV: 90.3 fL (ref 80.0–100.0)
MONO ABS: 342 {cells}/uL (ref 200–950)
MPV: 10.2 fL (ref 7.5–12.5)
Monocytes Relative: 6 %
NEUTROS PCT: 74 %
Neutro Abs: 4218 cells/uL (ref 1500–7800)
Platelets: 234 10*3/uL (ref 140–400)
RBC: 3.73 MIL/uL — AB (ref 4.20–5.80)
RDW: 14.8 % (ref 11.0–15.0)
WBC: 5.7 10*3/uL (ref 3.8–10.8)

## 2016-08-22 LAB — HEMOGLOBIN A1C
HEMOGLOBIN A1C: 5.1 % (ref <5.7)
MEAN PLASMA GLUCOSE: 100 mg/dL

## 2016-08-22 LAB — TSH: TSH: 2.25 m[IU]/L (ref 0.40–4.50)

## 2016-08-22 NOTE — Patient Instructions (Signed)
Please check your prescription prices at [email or web address].  Enter your eye drop name and see if you can find some coupons to help to decrease your cost.  Please also talk with Dr. Katy Fitch about changing medications to cheaper options.

## 2016-08-22 NOTE — Progress Notes (Signed)
MEDICARE ANNUAL WELLNESS VISIT AND FOLLOW UP Assessment:    1. Abdominal aortic aneurysm (AAA) without rupture ([place]) -follows with vascular and cardiothoracic  2. Coronary artery disease involving coronary bypass graft of native heart with angina pectoris ([place]) -on statin -BP well controlled -cont DASH diet -exercise as tolerated  3. Bilateral carotid artery disease ([place]) -followed by vascular  4. Essential hypertension -cont meds -well controlled -dash diet - TSH  5. Right bundle branch block -followed by cardilogy  6. Herniated lumbar intervertebral disc -not currently problematic -watchful waiting  7. CKD (chronic kidney disease) stage 4, GFR 15-29 ml/min (HCC) -follows with nephrology  8. Anemia, unspecified type -monitor CBC -likely secondary to CKD  9. BMI 26.0-26.9,adult -well controlled  10. Idiopathic gout, unspecified chronicity, unspecified site -cont allopuroinol  11. Mixed hyperlipidemia -cont statin -close to goal <70 - Lipid panel  12. Medication management  - CBC with Differential/Platelet - BASIC METABOLIC PANEL WITH GFR - Hepatic function panel  13. Prediabetes -cont diet and exercise - Hemoglobin A1c  14. Vitamin D deficiency -cont Vit D  15. Medicare annual wellness visit, subsequent -due next year   Over 30 minutes of exam, counseling, chart review, and critical decision making was performed  Future Appointments Date Time Provider [place]  08/22/2016 10:30 AM Terry Skeans, PA-C [facility] None  09/10/2016 9:30 AM Terry Pedro, MD [facility] None  12/20/2016 10:00 AM Terry Pinto, MD [facility] None     Plan:   During the course of the visit the patient was educated and counseled about appropriate screening and preventive services including:    Pneumococcal vaccine   Influenza vaccine  Prevnar 13  Td vaccine  Screening electrocardiogram  Colorectal cancer screening  Diabetes  screening  Glaucoma screening  Nutrition counseling    Subjective:  Terry Garner is a 76 y.o. male who presents for Medicare Annual Wellness Visit and 3 month follow up for HTN, hyperlipidemia, prediabetes, and vitamin D Def.   His blood pressure has been controlled at home, today their BP is BP: 132/64 He does workout. He denies chest pain, shortness of breath, dizziness.   He is on cholesterol medication and denies myalgias. His cholesterol is at goal. The cholesterol last visit was:  Lab Results  Component Value Date   CHOL 139 11/22/2015   HDL 28 (L) 11/22/2015   LDLCALC 83 11/22/2015   TRIG 141 11/22/2015   CHOLHDL 5.0 11/22/2015     Lab Results  Component Value Date   HGBA1C 5.3 11/22/2015   Last GFR Lab Results  Component Value Date   GFRNONAA 21 (L) 11/22/2015     Lab Results  Component Value Date   GFRAA 24 (L) 11/22/2015   Patient is on Vitamin D supplement.   Lab Results  Component Value Date   VD25OH 52 11/22/2015        Medication Review: Current Outpatient Prescriptions on File Prior to Visit  Medication Sig Dispense Refill  . allopurinol (ZYLOPRIM) 300 MG tablet Take 300 mg by mouth daily.    [name] amLODipine (NORVASC) 5 MG tablet Take 5 mg by mouth daily.    [name] aspirin EC 81 MG tablet Take 81 mg by mouth daily.    . brimonidine (ALPHAGAN) 0.15 % ophthalmic solution Place 1 drop into both eyes 2 (two) times daily.    . Cholecalciferol (VITAMIN D3) 5000 UNITS TABS Take 5,000 Units by mouth daily.    . clopidogrel (PLAVIX) 75 MG tablet TAKE ONE TABLET BY  MOUTH ONCE DAILY FOR  HEART  STENTS. 90 tablet 1  . dorzolamide-timolol (COSOPT) 22.3-6.8 MG/ML ophthalmic solution Place 1 drop into both eyes 2 (two) times daily.    . fenofibrate micronized (LOFIBRA) 134 MG capsule TAKE ONE CAPSULE BY MOUTH ONCE DAILY 30 capsule 0  . fenofibrate micronized (LOFIBRA) 134 MG capsule TAKE ONE CAPSULE BY MOUTH ONCE DAILY. 90 capsule 1  . furosemide (LASIX) 20 MG  tablet Take 1 tablet (20 mg total) by mouth daily. 30 tablet 11  . loteprednol (LOTEMAX) 0.5 % ophthalmic suspension Place 1 drop into the right eye 3 (three) times daily.    . nepafenac (ILEVRO) 0.3 % ophthalmic suspension Place 1 drop into the right eye daily.    . pravastatin (PRAVACHOL) 40 MG tablet TAKE ONE TABLET BY MOUTH AT BEDTIME FOR CHOLESTEROL. 90 tablet 1   No current facility-administered medications on file prior to visit.     Allergies: Allergies  Allergen Reactions  . Toprol Xl [Metoprolol Tartrate] Other (See Comments)    Bradycardia with beta blockers  . Lipitor [Atorvastatin]     Myalgias   . Coumadin [Warfarin Sodium] Rash  . Tape Rash    Clear tape  . Warfarin Rash    Current Problems (verified) has Herniated lumbar intervertebral disc; ASCAD; Carotid artery disease ([place]); Right bundle branch block; Hyperlipidemia; Gout; Vitamin D deficiency; Essential hypertension; Prediabetes; Medication management; Abdominal aortic aneurysm ([place]); BMI 26.0-26.9,adult; Medicare annual wellness visit, subsequent; Anemia; and CKD (chronic kidney disease) stage 4, GFR 15-29 ml/min (HCC) on his problem list.  Screening Tests Immunization History  Administered Date(s) Administered  . Influenza Split 04/09/2013  . Influenza, High Dose Seasonal PF 03/12/2014, 03/16/2015, 03/28/2016  . Pneumococcal Conjugate-13 03/12/2014  . Pneumococcal-Unspecified 06/25/2005  . Td 06/25/2002    Preventative care: Last colonoscopy: 2015  Names of Other Physician/Practitioners you currently use: 1. Terry Garner here for primary care 2. Terry Garner, eye doctor, last visit 2017 3. Terry Garner , dentist, last visit 2017, Marella Chimes and [employer] Patient Care Team: Terry Pinto, MD as PCP - General (Internal Garner) Marybelle Killings, MD as Consulting Physician (Orthopedic Surgery) Clarene Essex, MD as Consulting Physician (Gastroenterology) Ladene Artist, MD as Consulting Physician (Gastroenterology) [name], MD as Consulting Physician (Cardiology) Clent Jacks, MD as Consulting Physician (Ophthalmology)  Surgical: He  has a past surgical history that includes Cardiac catheterization; Hernia repair (Right, 93); Total hip arthroplasty (Left); Shoulder arthroscopy (Right); Cervical disc surgery; CARDIAC STENTS; Lumbar laminectomy (06/06/2012); Back surgery; and Eye surgery (Left, 06). Family His family history includes Diabetes in his brother; Heart disease in his brother, father, mother, and sister; Other in his brother; Stroke in his father. Social history  He reports that he has never smoked. He has never used smokeless tobacco. He reports that he does not drink alcohol or use drugs.  MEDICARE WELLNESS OBJECTIVES: Physical activity:   Cardiac risk factors:   Depression/mood screen:   Depression screen [facility] 2/9 11/22/2015  Decreased Interest 0  Down, Depressed, Hopeless 0  PHQ - 2 Score 0    ADLs:  In your present state of health, do you have any difficulty performing the following activities: 11/22/2015  Hearing? N  Vision? Y  Difficulty concentrating or making decisions? Y  Walking or climbing stairs? N  Dressing or bathing? N  Doing errands, shopping? N  Some recent data might be hidden     Cognitive Testing  Alert? Yes  Normal Appearance?Yes  Oriented to person? Yes  Place? Yes   Time? Yes  Recall of three objects?  Yes  Can perform simple calculations? Yes  Displays appropriate judgment?Yes  Can read the correct time from a watch face?Yes  EOL planning:     Objective:   Today's Vitals   08/22/16 1005  BP: 132/64  Pulse: (!) 50  Resp: 16  Temp: 97.8 F (36.6 C)  TempSrc: Temporal  Weight: 193 lb (87.5 kg)  Height: 6' 1.5" (1.867 m)   Body mass index is 25.12 kg/m.  General appearance: alert, no distress, WD/WN, male HEENT: normocephalic, sclerae anicteric, TMs pearly, nares patent, no  discharge or erythema, pharynx normal Oral cavity: MMM, no lesions Neck: supple, no lymphadenopathy, no thyromegaly, no masses Heart: RRR, normal S1, S2, no murmurs Lungs: CTA bilaterally, no wheezes, rhonchi, or rales Abdomen: +bs, soft, non tender, non distended, no masses, no hepatomegaly, no splenomegaly Musculoskeletal: nontender, no swelling, no obvious deformity Extremities: no edema, no cyanosis, no clubbing Pulses: 2+ symmetric, upper and lower extremities, normal cap refill Neurological: alert, oriented x 3, CN2-12 intact, strength normal upper extremities and lower extremities, sensation normal throughout, DTRs 2+ throughout, no cerebellar signs, gait normal Psychiatric: normal affect, behavior normal, pleasant   Medicare Attestation I have personally reviewed: The patient's medical and social history Their use of alcohol, tobacco or illicit drugs Their current medications and supplements The patient's functional ability including ADLs,fall risks, home safety risks, cognitive, and hearing and visual impairment Diet and physical activities Evidence for depression or mood disorders  The patient's weight, height, BMI, and visual acuity have been recorded in the chart.  I have made referrals, counseling, and provided education to the patient based on review of the above and I have provided the patient with a written personalized care plan for preventive services.     Terry Skeans, PA-C   08/22/2016

## 2016-08-23 LAB — BASIC METABOLIC PANEL WITH GFR
BUN: 41 mg/dL — AB (ref 7–25)
CO2: 23 mmol/L (ref 20–31)
Calcium: 9.2 mg/dL (ref 8.6–10.3)
Chloride: 104 mmol/L (ref 98–110)
Creat: 2.6 mg/dL — ABNORMAL HIGH (ref 0.70–1.18)
GFR, EST NON AFRICAN AMERICAN: 23 mL/min — AB (ref >=60)
GFR, Est African American: 26 mL/min — ABNORMAL LOW (ref >=60)
GLUCOSE: 86 mg/dL (ref 65–99)
Potassium: 4.1 mmol/L (ref 3.5–5.3)
Sodium: 141 mmol/L (ref 135–146)

## 2016-08-23 LAB — HEPATIC FUNCTION PANEL
ALT: 13 U/L (ref 9–46)
AST: 33 U/L (ref 10–35)
Albumin: 3.7 g/dL (ref 3.6–5.1)
Alkaline Phosphatase: 40 U/L (ref 40–115)
BILIRUBIN INDIRECT: 0.5 mg/dL (ref 0.2–1.2)
Bilirubin, Direct: 0.2 mg/dL (ref <=0.2)
TOTAL PROTEIN: 5.9 g/dL — AB (ref 6.1–8.1)
Total Bilirubin: 0.7 mg/dL (ref 0.2–1.2)

## 2016-08-23 LAB — LIPID PANEL
Cholesterol: 139 mg/dL (ref <200)
HDL: 27 mg/dL — ABNORMAL LOW (ref >40)
LDL CALC: 81 mg/dL (ref <100)
Total CHOL/HDL Ratio: 5.1 Ratio — ABNORMAL HIGH (ref <5.0)
Triglycerides: 157 mg/dL — ABNORMAL HIGH (ref <150)
VLDL: 31 mg/dL — ABNORMAL HIGH (ref <30)

## 2016-09-10 ENCOUNTER — Ambulatory Visit (INDEPENDENT_AMBULATORY_CARE_PROVIDER_SITE_OTHER): Payer: Medicare Other | Admitting: Ophthalmology

## 2016-09-10 DIAGNOSIS — H35371 Puckering of macula, right eye: Secondary | ICD-10-CM

## 2016-09-10 DIAGNOSIS — H59031 Cystoid macular edema following cataract surgery, right eye: Secondary | ICD-10-CM | POA: Diagnosis not present

## 2016-09-10 DIAGNOSIS — H348122 Central retinal vein occlusion, left eye, stable: Secondary | ICD-10-CM | POA: Diagnosis not present

## 2016-09-10 DIAGNOSIS — I10 Essential (primary) hypertension: Secondary | ICD-10-CM

## 2016-09-10 DIAGNOSIS — H35033 Hypertensive retinopathy, bilateral: Secondary | ICD-10-CM | POA: Diagnosis not present

## 2016-09-10 DIAGNOSIS — H43813 Vitreous degeneration, bilateral: Secondary | ICD-10-CM | POA: Diagnosis not present

## 2016-09-28 ENCOUNTER — Ambulatory Visit (HOSPITAL_COMMUNITY)
Admission: RE | Admit: 2016-09-28 | Discharge: 2016-09-28 | Disposition: A | Discharge status: Home / Self Care | Payer: Medicare Other | Source: Ambulatory Visit | Attending: Cardiology | Admitting: Cardiology

## 2016-09-28 DIAGNOSIS — I701 Atherosclerosis of renal artery: Secondary | ICD-10-CM | POA: Diagnosis not present

## 2016-09-28 DIAGNOSIS — I714 Abdominal aortic aneurysm, without rupture: Secondary | ICD-10-CM | POA: Diagnosis not present

## 2016-09-28 DIAGNOSIS — I159 Secondary hypertension, unspecified: Secondary | ICD-10-CM | POA: Diagnosis not present

## 2016-10-02 DIAGNOSIS — H40013 Open angle with borderline findings, low risk, bilateral: Secondary | ICD-10-CM | POA: Diagnosis not present

## 2016-10-04 ENCOUNTER — Emergency Department (HOSPITAL_COMMUNITY)
Admission: EM | Admit: 2016-10-04 | Discharge: 2016-10-04 | Disposition: A | Discharge status: Home / Self Care | Payer: Medicare Other | Attending: Emergency Medicine | Admitting: Emergency Medicine

## 2016-10-04 ENCOUNTER — Encounter (HOSPITAL_COMMUNITY): Payer: Self-pay | Admitting: *Deleted

## 2016-10-04 ENCOUNTER — Emergency Department (HOSPITAL_COMMUNITY): Payer: Medicare Other

## 2016-10-04 DIAGNOSIS — Z79899 Other long term (current) drug therapy: Secondary | ICD-10-CM | POA: Diagnosis not present

## 2016-10-04 DIAGNOSIS — Y999 Unspecified external cause status: Secondary | ICD-10-CM | POA: Insufficient documentation

## 2016-10-04 DIAGNOSIS — S0990XA Unspecified injury of head, initial encounter: Secondary | ICD-10-CM | POA: Diagnosis not present

## 2016-10-04 DIAGNOSIS — I129 Hypertensive chronic kidney disease with stage 1 through stage 4 chronic kidney disease, or unspecified chronic kidney disease: Secondary | ICD-10-CM | POA: Insufficient documentation

## 2016-10-04 DIAGNOSIS — I2510 Atherosclerotic heart disease of native coronary artery without angina pectoris: Secondary | ICD-10-CM | POA: Diagnosis not present

## 2016-10-04 DIAGNOSIS — S199XXA Unspecified injury of neck, initial encounter: Secondary | ICD-10-CM | POA: Diagnosis not present

## 2016-10-04 DIAGNOSIS — S0003XA Contusion of scalp, initial encounter: Secondary | ICD-10-CM | POA: Diagnosis not present

## 2016-10-04 DIAGNOSIS — Z7982 Long term (current) use of aspirin: Secondary | ICD-10-CM | POA: Diagnosis not present

## 2016-10-04 DIAGNOSIS — Y92009 Unspecified place in unspecified non-institutional (private) residence as the place of occurrence of the external cause: Secondary | ICD-10-CM | POA: Insufficient documentation

## 2016-10-04 DIAGNOSIS — Y9389 Activity, other specified: Secondary | ICD-10-CM | POA: Insufficient documentation

## 2016-10-04 DIAGNOSIS — Z96642 Presence of left artificial hip joint: Secondary | ICD-10-CM | POA: Insufficient documentation

## 2016-10-04 DIAGNOSIS — S0101XA Laceration without foreign body of scalp, initial encounter: Secondary | ICD-10-CM | POA: Diagnosis not present

## 2016-10-04 DIAGNOSIS — W28XXXA Contact with powered lawn mower, initial encounter: Secondary | ICD-10-CM | POA: Insufficient documentation

## 2016-10-04 DIAGNOSIS — N184 Chronic kidney disease, stage 4 (severe): Secondary | ICD-10-CM | POA: Diagnosis not present

## 2016-10-04 DIAGNOSIS — W19XXXA Unspecified fall, initial encounter: Secondary | ICD-10-CM

## 2016-10-04 NOTE — ED Notes (Signed)
Medium c-collar applied.  Pt moving all extremities and now with pain/stiffness to neck area.  Wet to dry dressing to hematoma/small lac to crown of head.  Bleeding controlled.

## 2016-10-04 NOTE — ED Notes (Signed)
EDP at bedside for wound closure

## 2016-10-04 NOTE — ED Notes (Signed)
Patient Alert and oriented X4. Stable and ambulatory. Patient verbalized understanding of the discharge instructions.  Patient belongings were taken by the patient.  

## 2016-10-04 NOTE — ED Provider Notes (Signed)
[place] DEPT Provider Note   CSN: [id number] Arrival date & time: 10/04/16  1226     History   Chief Complaint Chief Complaint  Patient presents with  . Fall  . Laceration    HPI Terry Garner is a 76 y.o. male.  HPI  76 y.o. male with a hx of AAA, CAD, CKD, HTN, HLD, presents to the Emergency Department today due to mechanical fall PTA. Pt states that he was using his lawnmower outside and tripped and "face planted" and believes to have hit his head on a trash can. Notes no LOC. No N/V. No numbness/tingling. No vision changes. No pain currently. Pt worried due to hematoma and being on Plavix for his cardiac stents. No CP/SOB/ABD pain. Notes bleeding stopped with direct pressure. No fevers. No gait abnormalities. No other symptoms noted   Past Medical History:  Diagnosis Date  . AAA (abdominal aortic aneurysm) (HCC)    3.6 cm (01/09/12 ultrasound)  . Allergy   . Arthritis   . Carotid artery disease ([place])   . Cataract   . Chronic kidney disease    CKD, right renal artery stenosis  . Coronary artery disease    mild left internal carotid artery stenosis  . Glaucoma   . Gout   . Heart murmur   . Hyperlipidemia   . Hypertension   . Tubular adenoma of colon 2003   Dr. Watt Climes  . Vitamin D deficiency     Patient Active Problem List   Diagnosis Date Noted  . Anemia 08/19/2015  . CKD (chronic kidney disease) stage 4, GFR 15-29 ml/min (HCC) 08/19/2015  . BMI 26.0-26.9,adult 05/17/2015  . Medicare annual wellness visit, subsequent 05/17/2015  . Abdominal aortic aneurysm ([place]) 12/02/2013  . Medication management 09/01/2013  . Essential hypertension 07/17/2013  . Prediabetes 07/17/2013  . Gout   . Vitamin D deficiency   . 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 Carotid artery disease ([place]) 01/15/2013  . Right bundle branch block 01/15/2013  . Hyperlipidemia 01/15/2013  . Herniated lumbar intervertebral disc 06/06/2012    Past Surgical History:  Procedure Laterality Date  . BACK  SURGERY    . CARDIAC CATHETERIZATION     stents  last -07  . CARDIAC STENTS    . CERVICAL DISC SURGERY    . EYE SURGERY Left 06   retinal detachment  . HERNIA REPAIR Right 93  . LUMBAR LAMINECTOMY  06/06/2012   Procedure: MICRODISCECTOMY LUMBAR LAMINECTOMY;  Surgeon: Marybelle Killings, MD;  Location: [facility];  Service: Orthopedics;  Laterality: Left;  Left L4-5 Microdiscectomy  . SHOULDER ARTHROSCOPY Right   . TOTAL HIP ARTHROPLASTY Left        Home Medications    Prior to Admission medications   Medication Sig Start Date End Date Taking? Authorizing Provider  allopurinol (ZYLOPRIM) 300 MG tablet Take 300 mg by mouth daily.    Historical Provider, MD  amLODipine (NORVASC) 5 MG tablet Take 5 mg by mouth daily.    Historical Provider, MD  aspirin EC 81 MG tablet Take 81 mg by mouth daily.    Historical Provider, MD  brimonidine (ALPHAGAN) 0.15 % ophthalmic solution Place 1 drop into both eyes 2 (two) times daily.    Historical Provider, MD  Cholecalciferol (VITAMIN D3) 5000 UNITS TABS Take 5,000 Units by mouth daily.    Historical Provider, MD  clopidogrel (PLAVIX) 75 MG tablet TAKE ONE TABLET BY MOUTH ONCE DAILY FOR  HEART  STENTS. 06/26/16   [name], MD  dorzolamide-timolol (COSOPT) 22.3-6.8 MG/ML ophthalmic solution Place 1 drop into both eyes 2 (two) times daily.    Historical Provider, MD  fenofibrate micronized (LOFIBRA) 134 MG capsule TAKE ONE CAPSULE BY MOUTH ONCE DAILY 09/12/15   Courtney Forcucci, PA-C  fenofibrate micronized (LOFIBRA) 134 MG capsule TAKE ONE CAPSULE BY MOUTH ONCE DAILY. 03/27/16   [name], MD  furosemide (LASIX) 20 MG tablet Take 1 tablet (20 mg total) by mouth daily. 08/31/15 08/30/16  Vicie Mutters, PA-C  loteprednol (LOTEMAX) 0.5 % ophthalmic suspension Place 1 drop into the right eye 3 (three) times daily.    Historical Provider, MD  nepafenac (ILEVRO) 0.3 % ophthalmic suspension Place 1 drop into the right eye daily.    Historical Provider, MD    pravastatin (PRAVACHOL) 40 MG tablet TAKE ONE TABLET BY MOUTH AT BEDTIME FOR CHOLESTEROL. 03/28/16   [name], MD    Family History Family History  Problem Relation Age of Onset  . Other Brother     laryngeal cancer  . Heart disease Brother   . Heart disease Mother   . Heart disease Father   . Stroke Father   . Heart disease Sister   . Diabetes Brother   . Colon cancer Neg Hx   . Esophageal cancer Neg Hx   . Stomach cancer Neg Hx   . Rectal cancer Neg Hx     Social History Social History  Substance Use Topics  . Smoking status: Never Smoker  . Smokeless tobacco: Never Used  . Alcohol use No     Allergies   Toprol xl [metoprolol tartrate]; Lipitor [atorvastatin]; Coumadin [warfarin sodium]; Tape; and Warfarin   Review of Systems Review of Systems ROS reviewed and all are negative for acute change except as noted in the HPI.  Physical Exam Updated Vital Signs BP (!) 147/63 (BP Location: Right Arm)   Pulse (!) 57   Temp 98.1 F (36.7 C) (Oral)   Resp 18   SpO2 98%   Physical Exam  Constitutional: He is oriented to person, place, and time. Vital signs are normal. He appears well-developed and well-nourished.  HENT:  Head: Normocephalic and atraumatic.  Right Ear: Hearing normal.  Left Ear: Hearing normal.  1cm laceration to top of scalp Bottom of wound visualized. Bleeding controlled.   Eyes: Conjunctivae and EOM are normal. Pupils are equal, round, and reactive to light.  Neck: Normal range of motion. Neck supple.  Cardiovascular: Normal rate, regular rhythm, normal heart sounds and intact distal pulses.   Pulmonary/Chest: Effort normal and breath sounds normal.  Abdominal: Soft.  Musculoskeletal: Normal range of motion.  Neurological: He is alert and oriented to person, place, and time.  Skin: Skin is warm and dry.  Psychiatric: He has a normal mood and affect. His speech is normal and behavior is normal. Thought content normal.  Nursing note and  vitals reviewed.  ED Treatments / Results  Labs (all labs ordered are listed, but only abnormal results are displayed) Labs Reviewed - No data to display  EKG  EKG Interpretation None       Radiology Ct Head Wo Contrast  Result Date: 10/04/2016 CLINICAL DATA:  Head injury after fall at home today. EXAM: CT HEAD WITHOUT CONTRAST CT CERVICAL SPINE WITHOUT CONTRAST TECHNIQUE: Multidetector CT imaging of the head and cervical spine was performed following the standard protocol without intravenous contrast. Multiplanar CT image reconstructions of the cervical spine were also generated. COMPARISON:  None. FINDINGS: CT HEAD FINDINGS Brain: Mild chronic  ischemic white matter disease is noted. No mass effect or midline shift is noted. Ventricular size is within normal limits. There is no evidence of mass lesion, hemorrhage or acute infarction. Vascular: No hyperdense vessel or unexpected calcification. Skull: Normal. Negative for fracture or focal lesion. Sinuses/Orbits: No acute finding. Other: None. CT CERVICAL SPINE FINDINGS Alignment: Grade 1 anterolisthesis of C4-5 is noted secondary to posterior facet joint hypertrophy. Skull base and vertebrae: No acute fracture. No primary bone lesion or focal pathologic process. Soft tissues and spinal canal: Carotid artery calcifications are noted suggesting atherosclerosis. Disc levels: There is fusion of the C5-6 disc space, most likely degenerative in origin. Moderate degenerative disc disease is noted at C6-7 with anterior osteophyte formation. Upper chest: Degenerative changes are seen involving the posterior facet joints bilaterally. Other: None. IMPRESSION: Mild chronic ischemic white matter disease. No acute intracranial abnormality seen. Degenerative changes as described above. No acute abnormality seen in the cervical spine. Electronically Signed   By: Marijo Conception, M.D.   On: 10/04/2016 13:31   Ct Cervical Spine Wo Contrast  Result Date:  10/04/2016 CLINICAL DATA:  Head injury after fall at home today. EXAM: CT HEAD WITHOUT CONTRAST CT CERVICAL SPINE WITHOUT CONTRAST TECHNIQUE: Multidetector CT imaging of the head and cervical spine was performed following the standard protocol without intravenous contrast. Multiplanar CT image reconstructions of the cervical spine were also generated. COMPARISON:  None. FINDINGS: CT HEAD FINDINGS Brain: Mild chronic ischemic white matter disease is noted. No mass effect or midline shift is noted. Ventricular size is within normal limits. There is no evidence of mass lesion, hemorrhage or acute infarction. Vascular: No hyperdense vessel or unexpected calcification. Skull: Normal. Negative for fracture or focal lesion. Sinuses/Orbits: No acute finding. Other: None. CT CERVICAL SPINE FINDINGS Alignment: Grade 1 anterolisthesis of C4-5 is noted secondary to posterior facet joint hypertrophy. Skull base and vertebrae: No acute fracture. No primary bone lesion or focal pathologic process. Soft tissues and spinal canal: Carotid artery calcifications are noted suggesting atherosclerosis. Disc levels: There is fusion of the C5-6 disc space, most likely degenerative in origin. Moderate degenerative disc disease is noted at C6-7 with anterior osteophyte formation. Upper chest: Degenerative changes are seen involving the posterior facet joints bilaterally. Other: None. IMPRESSION: Mild chronic ischemic white matter disease. No acute intracranial abnormality seen. Degenerative changes as described above. No acute abnormality seen in the cervical spine. Electronically Signed   By: Marijo Conception, M.D.   On: 10/04/2016 13:31    Procedures Procedures (including critical care time)  Medications Ordered in ED Medications - No data to display   Initial Impression / Assessment and Plan / ED Course  I have reviewed the triage vital signs and the nursing notes.  Pertinent labs & imaging results that were available during my  care of the patient were reviewed by me and considered in my medical decision making (see chart for details).  Final Clinical Impressions(s) / ED Diagnoses   {I have reviewed and evaluated the relevant imaging studies.    {I obtained HPI from historian.   ED Course:  Assessment: Patient is a 76 y.o. male that presents with laceration to top of scalp 2/2 mechanical fall. No LOC. No N/V. No vision changes. No headache. No pain currently. Tdap booster given. Pressure irrigation performed. Bottom of the wound visualized with bleeding controled. Laceration occurred < 8 hours prior to repair which was well tolerated. Pt has no co morbidities to effect normal wound healing. CT  Head/neck unremarkable. Wound is well approximated. Unlikely to required repair. Wound is shallow. Will heal well. Given neosporin and sterile wrap. Pt is hemodynamically stable w no complaints prior to dc.    Disposition/Plan:  DC Home Additional Verbal discharge instructions given and discussed with patient.  Pt Instructed to f/u with PCP in the next week for evaluation and treatment of symptoms. Return precautions given Pt acknowledges and agrees with plan  Supervising Physician Sharlett Iles, MD  Final diagnoses:  Fall, initial encounter  Laceration of scalp, initial encounter  Hematoma of scalp, initial encounter    New Prescriptions New Prescriptions   No medications on file       Shary Decamp, PA-C 10/04/16 [place], MD 10/05/16 2158

## 2016-10-04 NOTE — Discharge Instructions (Signed)
Please read and follow all provided instructions.  Your diagnoses today include:  1. Fall, initial encounter   2. Laceration of scalp, initial encounter   3. Hematoma of scalp, initial encounter     Tests performed today include: Vital signs. See below for your results today.   Medications prescribed:  Take as prescribed   Home care instructions:  Follow any educational materials contained in this packet.  Follow-up instructions: Please follow-up with your primary care provider for further evaluation of symptoms and treatment   Return instructions:  Please return to the Emergency Department if you do not get better, if you get worse, or new symptoms OR  - Fever (temperature greater than 101.65F)  - Bleeding that does not stop with holding pressure to the area    -Severe pain (please note that you may be more sore the day after your accident)  - Chest Pain  - Difficulty breathing  - Severe nausea or vomiting  - Inability to tolerate food and liquids  - Passing out  - Skin becoming red around your wounds  - Change in mental status (confusion or lethargy)  - New numbness or weakness    Please return if you have any other emergent concerns.  Additional Information:  Your vital signs today were: BP (!) 168/53    Pulse (!) 47    Temp 98.1 F (36.7 C) (Oral)    Resp 18    SpO2 100%  If your blood pressure (BP) was elevated above 135/85 this visit, please have this repeated by your doctor within one month. ---------------

## 2016-10-04 NOTE — ED Triage Notes (Signed)
PT was messing with lawn mover, turned around and tripped and then made a faceplant and believes hit the top of his head with the trash can.  Pt is on plavix.  Pt has hematoma and scalp abrasion/lac.  Bleeding stopped now.  No LOC. No pain.  Pupils 2-3 equal and bilateral, reactive to light.

## 2016-10-16 ENCOUNTER — Other Ambulatory Visit: Payer: Self-pay | Admitting: Internal Medicine

## 2016-10-25 ENCOUNTER — Telehealth: Payer: Self-pay | Admitting: *Deleted

## 2016-10-25 DIAGNOSIS — H40013 Open angle with borderline findings, low risk, bilateral: Secondary | ICD-10-CM | POA: Diagnosis not present

## 2016-10-25 NOTE — Telephone Encounter (Signed)
Dr Zenia Resides office called and asked if it was OK with Dr Melford Aase for the patient to try Diamox 250 mg bid for 1 week for his eyes.  Per Dr Melford Aase, it is OK and [facility] aware.

## 2016-10-26 ENCOUNTER — Ambulatory Visit (INDEPENDENT_AMBULATORY_CARE_PROVIDER_SITE_OTHER): Payer: Medicare Other | Admitting: Cardiovascular Disease

## 2016-10-26 ENCOUNTER — Encounter: Payer: Self-pay | Admitting: Cardiovascular Disease

## 2016-10-26 VITALS — BP 144/48 | HR 42 | Ht 74.0 in | Wt 188.0 lb

## 2016-10-26 DIAGNOSIS — I701 Atherosclerosis of renal artery: Secondary | ICD-10-CM

## 2016-10-26 DIAGNOSIS — I714 Abdominal aortic aneurysm, without rupture, unspecified: Secondary | ICD-10-CM

## 2016-10-26 DIAGNOSIS — I779 Disorder of arteries and arterioles, unspecified: Secondary | ICD-10-CM

## 2016-10-26 DIAGNOSIS — I10 Essential (primary) hypertension: Secondary | ICD-10-CM

## 2016-10-26 DIAGNOSIS — I739 Peripheral vascular disease, unspecified: Secondary | ICD-10-CM

## 2016-10-26 NOTE — Assessment & Plan Note (Signed)
Chronic right bundle-branch block and sinus bradycardia without symptoms.

## 2016-10-26 NOTE — Assessment & Plan Note (Signed)
History of hyperlipidemia on statin therapy with recent lipid profile performed 08/22/16 revealed total cholesterol 139, LDL 81 and HDL of 27.

## 2016-10-26 NOTE — Assessment & Plan Note (Signed)
History of carotid artery disease with Doppler performed 01/04/15 revealing moderate bilateral ICA stenosis. This will be followed on an annual basis.

## 2016-10-26 NOTE — Assessment & Plan Note (Signed)
History of CAD status post LAD and diagonal branch stenting back in 2006, 7 and 8. His last cardiac catheter physician performing Dr. [name] 06/09/09 revealed widely patent stents after false positive Myoview. He denies chest pain or shortness of breath.

## 2016-10-26 NOTE — Patient Instructions (Addendum)
Medication Instructions: Your physician recommends that you continue on your current medications as directed. Please refer to the Current Medication list given to you today.  Testing/Procedures: Your physician has requested that you have a carotid duplex. This test is an ultrasound of the carotid arteries in your neck. It looks at blood flow through these arteries that supply the brain with blood. Allow one hour for this exam. There are no restrictions or special instructions.  Your physician has requested that you have an abdominal aorta duplex. During this test, an ultrasound is used to evaluate the aorta. Allow 30 minutes for this exam. Do not eat after midnight the day before and avoid carbonated beverages (Now)  Your physician has requested that you have a renal artery duplex. During this test, an ultrasound is used to evaluate blood flow to the kidneys. Allow one hour for this exam. Do not eat after midnight the day before and avoid carbonated beverages. Take your medications as you usually do. (In 1 year)  Follow-Up: Your physician wants you to follow-up in: 1 year with Dr. [name]. You will receive a reminder letter in the mail two months in advance. If you don't receive a letter, please call our office to schedule the follow-up appointment.  If you need a refill on your cardiac medications before your next appointment, please call your pharmacy.

## 2016-10-26 NOTE — Assessment & Plan Note (Signed)
History of hypertension blood pressure measured 144/48. He is on amlodipine. Continue current meds at current dose

## 2016-10-26 NOTE — Progress Notes (Signed)
10/26/2016 [name]   10-23-1940  [id number]  Primary Physician MCKEOWN,WILLIAM DAVID, MD Primary Cardiologist: [name] MD [name]  HPI:  [name] is a 76 year old moderately overweight married Caucasian male father of one child formally a patient of Dr. Carlean Jews. [facility]. I last saw him 10/14/15.[name] He has a history of CAD status post LAD and diagonal branch stenting back in 2006, 2007 and 2008. He underwent cardiac catheterization by Dr. Nona Dell 06/09/09 revealing widely patent stents after a false positive Myoview. His other problems include chronic branch block, treated hypertension and hyperlipidemia. He denies chest pain or shortness of breath. He does have moderate carotid disease by duplex ultrasound. In addition, he does have moderate right renal artery stenosis as well as a small abdominal aortic aneurysm both of which were following by duplex ultrasound. He is neurologically asymptomatic on aspirin and Plavix. Since I saw him a year ago he remains completely stable   Current Outpatient Prescriptions  Medication Sig Dispense Refill  . allopurinol (ZYLOPRIM) 300 MG tablet Take 300 mg by mouth daily.    [name] amLODipine (NORVASC) 5 MG tablet Take 5 mg by mouth daily.    [name] aspirin EC 81 MG tablet Take 81 mg by mouth daily.    . brimonidine (ALPHAGAN) 0.15 % ophthalmic solution Place 1 drop into both eyes 2 (two) times daily.    . Cholecalciferol (VITAMIN D3) 5000 UNITS TABS Take 5,000 Units by mouth daily.    . clopidogrel (PLAVIX) 75 MG tablet TAKE ONE TABLET BY MOUTH ONCE DAILY FOR  HEART  STENTS. 90 tablet 1  . dorzolamide-timolol (COSOPT) 22.3-6.8 MG/ML ophthalmic solution Place 1 drop into both eyes 2 (two) times daily.    . fenofibrate micronized (LOFIBRA) 134 MG capsule TAKE ONE CAPSULE BY MOUTH ONCE DAILY 30 capsule 0  . furosemide (LASIX) 20 MG tablet Take 20 mg by mouth daily.    [name] loteprednol (LOTEMAX) 0.5 % ophthalmic suspension Place 1 drop into  the right eye 3 (three) times daily.    . nepafenac (ILEVRO) 0.3 % ophthalmic suspension Place 1 drop into the right eye daily.    . pravastatin (PRAVACHOL) 40 MG tablet TAKE ONE TABLET BY MOUTH AT BEDTIME FOR CHOLESTEROL. 90 tablet 1   No current facility-administered medications for this visit.     Allergies  Allergen Reactions  . Toprol Xl [Metoprolol Tartrate] Other (See Comments)    Bradycardia with beta blockers  . Lipitor [Atorvastatin]     Myalgias   . Coumadin [Warfarin Sodium] Rash  . Tape Rash    Clear tape  . Warfarin Rash    Social History   Social History  . Marital status: Married    Spouse name: N/A  . Number of children: N/A  . Years of education: N/A   Occupational History  . Not on file.   Social History Main Topics  . Smoking status: Never Smoker  . Smokeless tobacco: Never Used  . Alcohol use No  . Drug use: No  . Sexual activity: Not on file   Other Topics Concern  . Not on file   Social History Narrative  . No narrative on file     Review of Systems: General: negative for chills, fever, night sweats or weight changes.  Cardiovascular: negative for chest pain, dyspnea on exertion, edema, orthopnea, palpitations, paroxysmal nocturnal dyspnea or shortness of breath Dermatological: negative for rash Respiratory: negative for cough or wheezing Urologic: negative for  hematuria Abdominal: negative for nausea, vomiting, diarrhea, bright red blood per rectum, melena, or hematemesis Neurologic: negative for visual changes, syncope, or dizziness All other systems reviewed and are otherwise negative except as noted above.    Blood pressure (!) 144/48, pulse (!) 42, height 6\' 2"  (1.88 m), weight 188 lb (85.3 kg).  General appearance: alert and no distress Neck: no adenopathy, no JVD, supple, symmetrical, trachea midline, thyroid not enlarged, symmetric, no tenderness/mass/nodules and Soft bilateral carotid bruits Lungs: clear to auscultation  bilaterally Heart: normal apical impulse Extremities: extremities normal, atraumatic, no cyanosis or edema  EKG sinus bradycardia 42 with right bundle branch block unchanged from prior EKGs. I personally reviewed this EKG.  ASSESSMENT AND PLAN:   ASCAD History of CAD status post LAD and diagonal branch stenting back in 2006, 7 and 8. His last cardiac catheter physician performing Dr. [name] 06/09/09 revealed widely patent stents after false positive Myoview. He denies chest pain or shortness of breath.  Carotid artery disease History of carotid artery disease with Doppler performed 01/04/15 revealing moderate bilateral ICA stenosis. This will be followed on an annual basis.  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  Hyperlipidemia History of hyperlipidemia on statin therapy with recent lipid profile performed 08/22/16 revealed total cholesterol 139, LDL 81 and HDL of 27.  Essential hypertension History of hypertension blood pressure measured 144/48. He is on amlodipine. Continue current meds at current dose  Abdominal aortic aneurysm ([place]) History of a small abdominal aortic ears and demonstrated by duplex ultrasound most recently at the time of renal Dopplers on 09/28/16. This revealed an increase in dimension from 2.8 cm x 3.1 cm up to 3.2 cm x 3.5 cm. This will be followed on an annual basis.      [name] MD [place], [facility] 10/26/2016 9:08 AM

## 2016-10-26 NOTE — Assessment & Plan Note (Signed)
History of a small abdominal aortic ears and demonstrated by duplex ultrasound most recently at the time of renal Dopplers on 09/28/16. This revealed an increase in dimension from 2.8 cm x 3.1 cm up to 3.2 cm x 3.5 cm. This will be followed on an annual basis.

## 2016-11-05 DIAGNOSIS — E875 Hyperkalemia: Secondary | ICD-10-CM | POA: Diagnosis not present

## 2016-11-05 DIAGNOSIS — I129 Hypertensive chronic kidney disease with stage 1 through stage 4 chronic kidney disease, or unspecified chronic kidney disease: Secondary | ICD-10-CM | POA: Diagnosis not present

## 2016-11-05 DIAGNOSIS — N184 Chronic kidney disease, stage 4 (severe): Secondary | ICD-10-CM | POA: Diagnosis not present

## 2016-11-16 DIAGNOSIS — H40013 Open angle with borderline findings, low risk, bilateral: Secondary | ICD-10-CM | POA: Diagnosis not present

## 2016-11-23 ENCOUNTER — Encounter (HOSPITAL_COMMUNITY): Payer: Medicare Other

## 2016-11-23 ENCOUNTER — Other Ambulatory Visit (HOSPITAL_COMMUNITY): Payer: Medicare Other

## 2016-12-20 ENCOUNTER — Encounter: Payer: Self-pay | Admitting: Internal Medicine

## 2016-12-20 ENCOUNTER — Ambulatory Visit (INDEPENDENT_AMBULATORY_CARE_PROVIDER_SITE_OTHER): Payer: Medicare Other | Admitting: Internal Medicine

## 2016-12-20 VITALS — BP 142/64 | HR 48 | Temp 97.3°F | Resp 16 | Ht 73.25 in | Wt 183.4 lb

## 2016-12-20 DIAGNOSIS — E782 Mixed hyperlipidemia: Secondary | ICD-10-CM

## 2016-12-20 DIAGNOSIS — Z0001 Encounter for general adult medical examination with abnormal findings: Secondary | ICD-10-CM

## 2016-12-20 DIAGNOSIS — Z136 Encounter for screening for cardiovascular disorders: Secondary | ICD-10-CM | POA: Diagnosis not present

## 2016-12-20 DIAGNOSIS — Z125 Encounter for screening for malignant neoplasm of prostate: Secondary | ICD-10-CM

## 2016-12-20 DIAGNOSIS — M1000 Idiopathic gout, unspecified site: Secondary | ICD-10-CM

## 2016-12-20 DIAGNOSIS — I10 Essential (primary) hypertension: Secondary | ICD-10-CM | POA: Diagnosis not present

## 2016-12-20 DIAGNOSIS — Z0000 Encounter for general adult medical examination without abnormal findings: Secondary | ICD-10-CM | POA: Diagnosis not present

## 2016-12-20 DIAGNOSIS — Z79899 Other long term (current) drug therapy: Secondary | ICD-10-CM | POA: Diagnosis not present

## 2016-12-20 DIAGNOSIS — R7303 Prediabetes: Secondary | ICD-10-CM

## 2016-12-20 DIAGNOSIS — I25709 Atherosclerosis of coronary artery bypass graft(s), unspecified, with unspecified angina pectoris: Secondary | ICD-10-CM

## 2016-12-20 DIAGNOSIS — E559 Vitamin D deficiency, unspecified: Secondary | ICD-10-CM

## 2016-12-20 DIAGNOSIS — Z1212 Encounter for screening for malignant neoplasm of rectum: Secondary | ICD-10-CM

## 2016-12-20 LAB — CBC WITH DIFFERENTIAL/PLATELET
BASOS ABS: 52 {cells}/uL (ref 0–200)
Basophils Relative: 1 %
EOS ABS: 208 {cells}/uL (ref 15–500)
EOS PCT: 4 %
HCT: 32.1 % — ABNORMAL LOW (ref 38.5–50.0)
Hemoglobin: 10.5 g/dL — ABNORMAL LOW (ref 13.2–17.1)
LYMPHS PCT: 17 %
Lymphs Abs: 884 cells/uL (ref 850–3900)
MCH: 29.6 pg (ref 27.0–33.0)
MCHC: 32.7 g/dL (ref 32.0–36.0)
MCV: 90.4 fL (ref 80.0–100.0)
MONOS PCT: 6 %
MPV: 10 fL (ref 7.5–12.5)
Monocytes Absolute: 312 cells/uL (ref 200–950)
NEUTROS ABS: 3744 {cells}/uL (ref 1500–7800)
NEUTROS PCT: 72 %
PLATELETS: 218 10*3/uL (ref 140–400)
RBC: 3.55 MIL/uL — ABNORMAL LOW (ref 4.20–5.80)
RDW: 15.5 % — ABNORMAL HIGH (ref 11.0–15.0)
WBC: 5.2 10*3/uL (ref 3.8–10.8)

## 2016-12-20 LAB — TSH: TSH: 3.38 m[IU]/L (ref 0.40–4.50)

## 2016-12-20 MED ORDER — PREDNISONE 20 MG PO TABS: ORAL_TABLET | ORAL | 1 refills | Status: AC

## 2016-12-20 NOTE — Patient Instructions (Signed)

## 2016-12-20 NOTE — Progress Notes (Signed)
[facility]   [name], M.D.      Uvaldo Bristle. Silverio Lay, P.A.-C [facility]                [address] [place], [place] [address] Telephone [phone] Telefax [phone] Annual  Screening/Preventative Visit  & Comprehensive Evaluation & Examination     This very nice 76 y.o. MWM presents for a Screening/Preventative Visit & comprehensive evaluation and management of multiple medical co-morbidities.  Patient has been followed for HTN, T2_NIDDM  Prediabetes, Hyperlipidemia and Vitamin D Deficiency.  Patient seems to have some difficulty assimilating & comprehending information & advice he is given by various medical providers to the point of misinterpreting information.     HTN predates since 1998. Patient's BP has been controlled at home.  Today's BP is 142/64. Patient has ASCAD s/p PCA/stenting x 3 (2006, 2007 & 2008).  Hx/o False Positive Myoview in 2010 w/neg Ht Cath.  Also he's being followed by Dr [name] for a Rt Renal Aa stenosis and a small AAA. 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 Patient denies any cardiac symptoms as chest pain, palpitations, shortness of breath, dizziness or ankle swelling.     Patient's hyperlipidemia is controlled with diet and medications. Patient denies myalgias or other medication SE's. Last lipids were at goal:  Lab Results  Component Value Date   CHOL 139 08/22/2016   HDL 27 (L) 08/22/2016   LDLCALC 81 08/22/2016   TRIG 157 (H) 08/22/2016   CHOLHDL 5.1 (H) 08/22/2016      Patient has prediabetes (A1c 6.0% in 2013) and patient denies reactive hypoglycemic symptoms, visual blurring, diabetic polys or paresthesias. Last A1c was at goal: Lab Results  Component Value Date   HGBA1C 5.1 08/22/2016       Finally, patient has history of Vitamin D Deficiency ("27" in 2008) and last vitamin D was still sl low (goal 70-100): Lab Results  Component  Value Date   VD25OH 52 11/22/2015   Current Outpatient Prescriptions on File Prior to Visit  Medication Sig  . allopurinol (ZYLOPRIM) 300 MG tablet Take 300 mg by mouth daily.  [name] amLODipine (NORVASC) 5 MG tablet Take 5 mg by mouth daily.  [name] aspirin EC 81 MG tablet Take 81 mg by mouth daily.  . brimonidine (ALPHAGAN) 0.15 % ophthalmic solution Place 1 drop into both eyes 2 (two) times daily.  . Cholecalciferol (VITAMIN D3) 5000 UNITS TABS Take 5,000 Units by mouth daily.  . clopidogrel (PLAVIX) 75 MG tablet TAKE ONE TABLET BY MOUTH ONCE DAILY FOR  HEART  STENTS.  [name] dorzolamide-timolol (COSOPT) 22.3-6.8 MG/ML ophthalmic solution Place 1 drop into both eyes 2 (two) times daily.  . fenofibrate micronized (LOFIBRA) 134 MG capsule TAKE ONE CAPSULE BY MOUTH ONCE DAILY  . furosemide (LASIX) 20 MG tablet Take 20 mg by mouth daily.  [name] loteprednol (LOTEMAX) 0.5 % ophthalmic suspension Place 1 drop into the right eye 3 (three) times daily.  . nepafenac (ILEVRO) 0.3 % ophthalmic suspension Place 1 drop into the right eye daily.  . pravastatin (PRAVACHOL) 40 MG tablet TAKE ONE TABLET BY MOUTH AT BEDTIME FOR CHOLESTEROL.   No current facility-administered medications on file prior to visit.    Allergies  Allergen Reactions  . Toprol Xl [Metoprolol Tartrate] Other (See Comments)    Bradycardia with beta blockers  .  Lipitor [Atorvastatin]     Myalgias   . Coumadin [Warfarin Sodium] Rash  . Tape Rash    Clear tape  . Warfarin Rash   Past Medical History:  Diagnosis Date  . AAA (abdominal aortic aneurysm) ([facility])    3.6 cm (01/09/12 ultrasound)  . Allergy   . Arthritis   . Carotid artery disease ([place])   . Cataract   . Chronic kidney disease    CKD, right renal artery stenosis  . Coronary artery disease    mild left internal carotid artery stenosis  . Glaucoma   . Gout   . Heart murmur   . Hyperlipidemia   . Hypertension   . Tubular adenoma of colon 2003   Dr. Watt Climes  . Vitamin D  deficiency    Health Maintenance  Topic Date Due  . INFLUENZA VACCINE  01/23/2017  . COLONOSCOPY  12/05/2018  . TETANUS/TDAP  08/28/2022  . PNA vac Low Risk Adult  Completed   Immunization History  Administered Date(s) Administered  . Influenza Split 04/09/2013  . Influenza, High Dose Seasonal PF 03/12/2014, 03/16/2015, 03/28/2016  . Pneumococcal Conjugate-13 03/12/2014  . Pneumococcal-Unspecified 06/25/2005  . Td 06/25/2002   Past Surgical History:  Procedure Laterality Date  . BACK SURGERY    . CARDIAC CATHETERIZATION     stents  last -07  . CARDIAC STENTS    . CERVICAL DISC SURGERY    . EYE SURGERY Left 06   retinal detachment  . HERNIA REPAIR Right 93  . LUMBAR LAMINECTOMY  06/06/2012   Procedure: MICRODISCECTOMY LUMBAR LAMINECTOMY;  Surgeon: Marybelle Killings, MD;  Location: [place];  Service: Orthopedics;  Laterality: Left;  Left L4-5 Microdiscectomy  . SHOULDER ARTHROSCOPY Right   . TOTAL HIP ARTHROPLASTY Left    Family History  Problem Relation Age of Onset  . Other Brother        laryngeal cancer  . Heart disease Brother   . Heart disease Mother   . Heart disease Father   . Stroke Father   . Heart disease Sister   . Diabetes Brother   . Colon cancer Neg Hx   . Esophageal cancer Neg Hx   . Stomach cancer Neg Hx   . Rectal cancer Neg Hx    Social History   Social History  . Marital status: Married    Spouse name: N/A  . Number of children: N/A  . Years of education: N/A   Occupational History  . Not on file.   Social History Main Topics  . Smoking status: Never Smoker  . Smokeless tobacco: Never Used  . Alcohol use No  . Drug use: No  . Sexual activity: Not on file   Other Topics Concern  . Not on file   Social History Narrative  . No narrative on file    ROS Constitutional: Denies fever, chills, weight loss/gain, headaches, insomnia,  night sweats or change in appetite. Does c/o fatigue. Eyes: Denies redness, blurred vision, diplopia,  discharge, itchy or watery eyes.  ENT: Denies discharge, congestion, post nasal drip, epistaxis, sore throat, earache, hearing loss, dental pain, Tinnitus, Vertigo, Sinus pain or snoring.  Cardio: Denies chest pain, palpitations, irregular heartbeat, syncope, dyspnea, diaphoresis, orthopnea, PND, claudication or edema Respiratory: denies cough, dyspnea, DOE, pleurisy, hoarseness, laryngitis or wheezing.  Gastrointestinal: Denies dysphagia, heartburn, reflux, water brash, pain, cramps, nausea, vomiting, bloating, diarrhea, constipation, hematemesis, melena, hematochezia, jaundice or hemorrhoids Genitourinary: Denies dysuria, frequency, urgency, nocturia, hesitancy, discharge, hematuria or flank pain  Musculoskeletal: Denies arthralgia, myalgia, stiffness, Jt. Swelling, pain, limp or strain/sprain. Denies Falls. Skin: Denies puritis, rash, hives, warts, acne, eczema or change in skin lesion Neuro: No weakness, tremor, incoordination, spasms, paresthesia or pain Psychiatric: Denies confusion, memory loss or sensory loss. Denies Depression. Endocrine: Denies change in weight, skin, hair change, nocturia, and paresthesia, diabetic polys, visual blurring or hyper / hypo glycemic episodes.  Heme/Lymph: No excessive bleeding, bruising or enlarged lymph nodes.  Physical Exam  BP (!) 142/64   Pulse (!) 48   Temp 97.3 F (36.3 C)   Resp 16   Ht 6' 1.25" (1.861 m)   Wt 183 lb 6.4 oz (83.2 kg)   BMI 24.03 kg/m   General Appearance: Well nourished and well groomed and in no apparent distress.  Eyes: PERRLA, EOMs, conjunctiva no swelling or erythema, normal fundi and vessels. Sinuses: No frontal/maxillary tenderness ENT/Mouth: EACs patent / TMs  nl. Nares clear without erythema, swelling, mucoid exudates. Oral hygiene is good. No erythema, swelling, or exudate. Tongue normal, non-obstructing. Tonsils not swollen or erythematous. Hearing normal.  Neck: Supple, thyroid normal. No bruits, nodes or  JVD. Respiratory: Respiratory effort normal.  BS equal and clear bilateral without rales, rhonci, wheezing or stridor. Cardio: Heart sounds are normal with regular rate and rhythm and no murmurs, rubs or gallops. Peripheral pulses are normal and equal bilaterally without edema. No aortic or femoral bruits. Chest: symmetric with normal excursions and percussion.  Abdomen: Soft, with Nl bowel sounds. Nontender, no guarding, rebound, hernias, masses, or organomegaly.  Lymphatics: Non tender without lymphadenopathy.  Genitourinary: No hernias.Testes nl. DRE - prostate nl for age - smooth & firm w/o nodules. Musculoskeletal: Full ROM all peripheral extremities, joint stability, 5/5 strength, and normal gait. Skin: Warm and dry without rashes, lesions, cyanosis, clubbing or  ecchymosis.  Neuro: Cranial nerves intact, reflexes equal bilaterally. Normal muscle tone, no cerebellar symptoms. Sensation intact.  Pysch: Alert and oriented X 3 with normal affect, insight and judgment appropriate.   Assessment and Plan  1. Annual Preventative/Screening Exam   2. Essential hypertension  - Urinalysis, Routine w reflex microscopic - Microalbumin / creatinine urine ratio - CBC with Differential/Platelet - BASIC METABOLIC PANEL WITH GFR - Magnesium - TSH  3. Hyperlipidemia, mixed  - Hepatic function panel - Lipid panel - TSH  4. Prediabetes  - Hemoglobin A1c - Insulin, random  5. Vitamin D deficiency  - VITAMIN D 25 Hydroxy  6. Coronary artery disease involving coronary bypass graft of native heart with angina pectoris ([place])   7. Idiopathic gout  - Uric acid  8. Screening for rectal cancer  - POC Hemoccult Bld/Stl   9. Prostate cancer screening  - PSA  12. Medication management  - Urinalysis, Routine w reflex microscopic - Microalbumin / creatinine urine ratio - CBC with Differential/Platelet - BASIC METABOLIC PANEL WITH GFR - Hepatic function panel - Magnesium - Lipid  panel - TSH - Hemoglobin A1c - Insulin, random - VITAMIN D 25 Hydroxy  - Uric acid       Patient was counseled in prudent diet, weight control to achieve/maintain BMI less than 25, BP monitoring, regular exercise and medications as discussed.  Discussed med effects and SE's. Routine screening labs and tests as requested with regular follow-up as recommended. Over 40 minutes of exam, counseling, chart review and high complex critical decision making was performed

## 2016-12-21 LAB — URINALYSIS, ROUTINE W REFLEX MICROSCOPIC
Bilirubin Urine: NEGATIVE
Glucose, UA: NEGATIVE
HGB URINE DIPSTICK: NEGATIVE
Ketones, ur: NEGATIVE
LEUKOCYTES UA: NEGATIVE
NITRITE: NEGATIVE
PROTEIN: NEGATIVE
Specific Gravity, Urine: 1.013 (ref 1.001–1.035)
pH: 7 (ref 5.0–8.0)

## 2016-12-21 LAB — HEMOGLOBIN A1C
HEMOGLOBIN A1C: 4.8 % (ref <5.7)
Mean Plasma Glucose: 91 mg/dL

## 2016-12-21 LAB — MICROALBUMIN / CREATININE URINE RATIO
CREATININE, URINE: 137 mg/dL (ref 20–370)
MICROALB UR: 0.8 mg/dL
MICROALB/CREAT RATIO: 6 ug/mg{creat} (ref <30)

## 2016-12-21 LAB — BASIC METABOLIC PANEL WITH GFR
BUN: 32 mg/dL — AB (ref 7–25)
CALCIUM: 9.3 mg/dL (ref 8.6–10.3)
CHLORIDE: 102 mmol/L (ref 98–110)
CO2: 27 mmol/L (ref 20–31)
CREATININE: 2.68 mg/dL — AB (ref 0.70–1.18)
GFR, Est African American: 25 mL/min — ABNORMAL LOW (ref >=60)
GFR, Est Non African American: 22 mL/min — ABNORMAL LOW (ref >=60)
Glucose, Bld: 90 mg/dL (ref 65–99)
Potassium: 4.5 mmol/L (ref 3.5–5.3)
Sodium: 142 mmol/L (ref 135–146)

## 2016-12-21 LAB — HEPATIC FUNCTION PANEL
ALBUMIN: 3.8 g/dL (ref 3.6–5.1)
ALT: 14 U/L (ref 9–46)
AST: 33 U/L (ref 10–35)
Alkaline Phosphatase: 48 U/L (ref 40–115)
BILIRUBIN TOTAL: 0.8 mg/dL (ref 0.2–1.2)
Bilirubin, Direct: 0.2 mg/dL (ref <=0.2)
Indirect Bilirubin: 0.6 mg/dL (ref 0.2–1.2)
Total Protein: 6 g/dL — ABNORMAL LOW (ref 6.1–8.1)

## 2016-12-21 LAB — LIPID PANEL
CHOLESTEROL: 134 mg/dL (ref <200)
HDL: 26 mg/dL — AB (ref >40)
LDL Cholesterol: 74 mg/dL (ref <100)
Total CHOL/HDL Ratio: 5.2 Ratio — ABNORMAL HIGH (ref <5.0)
Triglycerides: 168 mg/dL — ABNORMAL HIGH (ref <150)
VLDL: 34 mg/dL — ABNORMAL HIGH (ref <30)

## 2016-12-21 LAB — VITAMIN D 25 HYDROXY (VIT D DEFICIENCY, FRACTURES): VIT D 25 HYDROXY: 50 ng/mL (ref 30–100)

## 2016-12-21 LAB — INSULIN, RANDOM: INSULIN: 3.3 u[IU]/mL (ref 2.0–19.6)

## 2016-12-21 LAB — URIC ACID: Uric Acid, Serum: 7.7 mg/dL (ref 4.0–8.0)

## 2016-12-21 LAB — MAGNESIUM: MAGNESIUM: 2.3 mg/dL (ref 1.5–2.5)

## 2016-12-21 LAB — PSA: PSA: 2.9 ng/mL (ref <=4.0)

## 2016-12-24 ENCOUNTER — Other Ambulatory Visit: Payer: Self-pay | Admitting: Internal Medicine

## 2017-01-02 ENCOUNTER — Other Ambulatory Visit: Payer: Self-pay | Admitting: *Deleted

## 2017-01-02 DIAGNOSIS — Z1212 Encounter for screening for malignant neoplasm of rectum: Secondary | ICD-10-CM

## 2017-01-02 LAB — POC HEMOCCULT BLD/STL (HOME/3-CARD/SCREEN)
Card #2 Fecal Occult Blod, POC: NEGATIVE
Card #3 Fecal Occult Blood, POC: NEGATIVE
FECAL OCCULT BLD: NEGATIVE

## 2017-01-10 ENCOUNTER — Ambulatory Visit (INDEPENDENT_AMBULATORY_CARE_PROVIDER_SITE_OTHER): Payer: Medicare Other | Admitting: Ophthalmology

## 2017-01-10 DIAGNOSIS — H43813 Vitreous degeneration, bilateral: Secondary | ICD-10-CM

## 2017-01-10 DIAGNOSIS — H35371 Puckering of macula, right eye: Secondary | ICD-10-CM

## 2017-01-10 DIAGNOSIS — I10 Essential (primary) hypertension: Secondary | ICD-10-CM

## 2017-01-10 DIAGNOSIS — H59031 Cystoid macular edema following cataract surgery, right eye: Secondary | ICD-10-CM | POA: Diagnosis not present

## 2017-01-10 DIAGNOSIS — H35033 Hypertensive retinopathy, bilateral: Secondary | ICD-10-CM

## 2017-01-10 DIAGNOSIS — H353112 Nonexudative age-related macular degeneration, right eye, intermediate dry stage: Secondary | ICD-10-CM | POA: Diagnosis not present

## 2017-01-30 DIAGNOSIS — N184 Chronic kidney disease, stage 4 (severe): Secondary | ICD-10-CM | POA: Diagnosis not present

## 2017-01-30 DIAGNOSIS — I129 Hypertensive chronic kidney disease with stage 1 through stage 4 chronic kidney disease, or unspecified chronic kidney disease: Secondary | ICD-10-CM | POA: Diagnosis not present

## 2017-01-30 DIAGNOSIS — D631 Anemia in chronic kidney disease: Secondary | ICD-10-CM | POA: Diagnosis not present

## 2017-01-30 DIAGNOSIS — E875 Hyperkalemia: Secondary | ICD-10-CM | POA: Diagnosis not present

## 2017-02-13 DIAGNOSIS — Z961 Presence of intraocular lens: Secondary | ICD-10-CM | POA: Diagnosis not present

## 2017-02-13 DIAGNOSIS — H353124 Nonexudative age-related macular degeneration, left eye, advanced atrophic with subfoveal involvement: Secondary | ICD-10-CM | POA: Diagnosis not present

## 2017-02-13 DIAGNOSIS — H35373 Puckering of macula, bilateral: Secondary | ICD-10-CM | POA: Diagnosis not present

## 2017-02-13 DIAGNOSIS — H353211 Exudative age-related macular degeneration, right eye, with active choroidal neovascularization: Secondary | ICD-10-CM | POA: Diagnosis not present

## 2017-02-13 DIAGNOSIS — H40013 Open angle with borderline findings, low risk, bilateral: Secondary | ICD-10-CM | POA: Diagnosis not present

## 2017-02-15 ENCOUNTER — Encounter (INDEPENDENT_AMBULATORY_CARE_PROVIDER_SITE_OTHER): Payer: Medicare Other | Admitting: Ophthalmology

## 2017-02-15 DIAGNOSIS — Z85828 Personal history of other malignant neoplasm of skin: Secondary | ICD-10-CM | POA: Diagnosis not present

## 2017-02-15 DIAGNOSIS — I10 Essential (primary) hypertension: Secondary | ICD-10-CM | POA: Diagnosis not present

## 2017-02-15 DIAGNOSIS — L814 Other melanin hyperpigmentation: Secondary | ICD-10-CM | POA: Diagnosis not present

## 2017-02-15 DIAGNOSIS — H43813 Vitreous degeneration, bilateral: Secondary | ICD-10-CM | POA: Diagnosis not present

## 2017-02-15 DIAGNOSIS — H35033 Hypertensive retinopathy, bilateral: Secondary | ICD-10-CM

## 2017-02-15 DIAGNOSIS — L570 Actinic keratosis: Secondary | ICD-10-CM | POA: Diagnosis not present

## 2017-02-15 DIAGNOSIS — H348122 Central retinal vein occlusion, left eye, stable: Secondary | ICD-10-CM | POA: Diagnosis not present

## 2017-02-15 DIAGNOSIS — D225 Melanocytic nevi of trunk: Secondary | ICD-10-CM | POA: Diagnosis not present

## 2017-02-15 DIAGNOSIS — H353211 Exudative age-related macular degeneration, right eye, with active choroidal neovascularization: Secondary | ICD-10-CM | POA: Diagnosis not present

## 2017-02-15 DIAGNOSIS — L821 Other seborrheic keratosis: Secondary | ICD-10-CM | POA: Diagnosis not present

## 2017-02-15 DIAGNOSIS — L720 Epidermal cyst: Secondary | ICD-10-CM | POA: Diagnosis not present

## 2017-02-18 ENCOUNTER — Encounter (INDEPENDENT_AMBULATORY_CARE_PROVIDER_SITE_OTHER): Payer: Medicare Other | Admitting: Ophthalmology

## 2017-03-01 ENCOUNTER — Encounter (INDEPENDENT_AMBULATORY_CARE_PROVIDER_SITE_OTHER): Payer: Medicare Other | Admitting: Ophthalmology

## 2017-03-01 DIAGNOSIS — H35371 Puckering of macula, right eye: Secondary | ICD-10-CM | POA: Diagnosis not present

## 2017-03-01 DIAGNOSIS — H35033 Hypertensive retinopathy, bilateral: Secondary | ICD-10-CM

## 2017-03-01 DIAGNOSIS — H348122 Central retinal vein occlusion, left eye, stable: Secondary | ICD-10-CM | POA: Diagnosis not present

## 2017-03-01 DIAGNOSIS — H34811 Central retinal vein occlusion, right eye, with macular edema: Secondary | ICD-10-CM | POA: Diagnosis not present

## 2017-03-01 DIAGNOSIS — I10 Essential (primary) hypertension: Secondary | ICD-10-CM | POA: Diagnosis not present

## 2017-03-01 DIAGNOSIS — H43813 Vitreous degeneration, bilateral: Secondary | ICD-10-CM | POA: Diagnosis not present

## 2017-03-14 ENCOUNTER — Encounter (INDEPENDENT_AMBULATORY_CARE_PROVIDER_SITE_OTHER): Payer: Medicare Other | Admitting: Ophthalmology

## 2017-03-14 DIAGNOSIS — H353211 Exudative age-related macular degeneration, right eye, with active choroidal neovascularization: Secondary | ICD-10-CM

## 2017-03-14 DIAGNOSIS — H34231 Retinal artery branch occlusion, right eye: Secondary | ICD-10-CM

## 2017-03-14 DIAGNOSIS — H34811 Central retinal vein occlusion, right eye, with macular edema: Secondary | ICD-10-CM

## 2017-03-14 DIAGNOSIS — H348122 Central retinal vein occlusion, left eye, stable: Secondary | ICD-10-CM | POA: Diagnosis not present

## 2017-03-14 DIAGNOSIS — I10 Essential (primary) hypertension: Secondary | ICD-10-CM

## 2017-03-14 DIAGNOSIS — H35033 Hypertensive retinopathy, bilateral: Secondary | ICD-10-CM | POA: Diagnosis not present

## 2017-03-14 DIAGNOSIS — H43813 Vitreous degeneration, bilateral: Secondary | ICD-10-CM

## 2017-03-25 ENCOUNTER — Other Ambulatory Visit: Payer: Self-pay | Admitting: Internal Medicine

## 2017-04-02 ENCOUNTER — Encounter: Payer: Self-pay | Admitting: Physician Assistant

## 2017-04-02 ENCOUNTER — Ambulatory Visit (INDEPENDENT_AMBULATORY_CARE_PROVIDER_SITE_OTHER): Payer: Medicare Other | Admitting: Physician Assistant

## 2017-04-02 VITALS — BP 146/70 | HR 81 | Temp 97.7°F | Resp 14 | Ht 73.25 in | Wt 179.2 lb

## 2017-04-02 DIAGNOSIS — E782 Mixed hyperlipidemia: Secondary | ICD-10-CM | POA: Diagnosis not present

## 2017-04-02 DIAGNOSIS — R634 Abnormal weight loss: Secondary | ICD-10-CM | POA: Diagnosis not present

## 2017-04-02 DIAGNOSIS — Z23 Encounter for immunization: Secondary | ICD-10-CM

## 2017-04-02 DIAGNOSIS — I714 Abdominal aortic aneurysm, without rupture, unspecified: Secondary | ICD-10-CM

## 2017-04-02 DIAGNOSIS — I10 Essential (primary) hypertension: Secondary | ICD-10-CM

## 2017-04-02 DIAGNOSIS — I779 Disorder of arteries and arterioles, unspecified: Secondary | ICD-10-CM | POA: Diagnosis not present

## 2017-04-02 DIAGNOSIS — Z79899 Other long term (current) drug therapy: Secondary | ICD-10-CM | POA: Diagnosis not present

## 2017-04-02 DIAGNOSIS — I739 Peripheral vascular disease, unspecified: Principal | ICD-10-CM

## 2017-04-02 NOTE — Patient Instructions (Addendum)
Follow up with GI doctor for the fecal incontinence and weight loss, constipation Phone: [phone]  Make sure you are eating small frequent meals.   Try to walk/build muscle mass  Weight loss can be from kidney function/chronic disease

## 2017-04-02 NOTE — Progress Notes (Signed)
Patient ID: Terry Garner, male   DOB: 1940-09-27, 76 y.o.   MRN: [id number]  Follow up  Assessment:    Hypertension - continue medications, DASH diet, exercise and monitor at home. Call if greater than 130/80.  - TSH   Hyperlipidemia -continue medications, check lipids, decrease fatty foods, increase activity.  - Lipid panel  Abdominal aortic aneurysm Control blood pressure, cholesterol, glucose, increase exercise.   Encounter for long-term (current) use of other medications - CBC with Differential - BASIC METABOLIC PANEL WITH GFR - Hepatic function panel - Magnesium  Carotid artery disease Control blood pressure, cholesterol, glucose, increase exercise.   Needs flu shot -     Flu vaccine HIGH DOSE PF  Weight loss Had work up in 2015, normal colonoscopy/EGD however having constipation and fecal incontinence will refer back, get CXR, check labs, eat small frequent meals. Had normal CT AB 2015.  ? related to CKD/chronic disease -     PSA -     HIV antibody -     Hepatitis C antibody -     C-reactive protein -     DG Chest 2 View; Future   Future Appointments Date Time Provider [place]  04/02/2017 2:45 PM Vicie Mutters, PA-C [facility] None  04/03/2017 8:15 AM [name], MD [facility] None  07/15/2017 10:30 AM [name], MD [facility] None  01/24/2018 10:00 AM [name], MD [facility] None     Subjective:  Terry Garner is a 76 y.o. MWM who presents for follow up.   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 His blood pressure has been controlled at home, normally 130/55, today their BP is BP: (!) 146/70 He does not workout  He denies chest pain, shortness of breath, dizziness. He is not driving anymore due to [place], wife is here with him.   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  Lab Results  Component Value Date   GFRNONAA 22 (L)  12/20/2016   He is on cholesterol medication and denies myalgias. His cholesterol is at goal. The cholesterol last visit was:   Lab Results  Component Value Date   CHOL 134 12/20/2016   HDL 26 (L) 12/20/2016   LDLCALC 74 12/20/2016   TRIG 168 (H) 12/20/2016   CHOLHDL 5.2 (H) 12/20/2016   He is screened for PreDiabetes, was 6.0 in July 2013. He has been working on diet and exercise for prediabetes, and denies foot ulcerations, hyperglycemia, hypoglycemia , nausea, paresthesia of the feet, polydipsia, polyuria and visual disturbances. Last A1C in the office was:  Lab Results  Component Value Date   HGBA1C 4.8 12/20/2016   Patient is on Vitamin D supplement.   Lab Results  Component Value Date   VD25OH 50 12/20/2016     BMI is Body mass index is 23.48 kg/m., he has had weight loss without trying, his weight is down 9 lbs in 5 months. His last colonoscopy and EGD was 2015 and normal CT AB pelvis that was gotten for diarrhea/weight loss. He was never a smoker. He still has a good appetite, no trouble swallowing or sore throat. Normal CXR 2016. Denies SOB, CP. Has occ dizziness, has urinary frequency but he is on a fluid pill.  Wt Readings from Last 3 Encounters:  04/02/17 179 lb 3.2 oz (81.3 kg)  12/20/16 183 lb 6.4 oz (83.2 kg)  10/26/16 188 lb (85.3 kg)    Medication Review: Current Outpatient Prescriptions on File Prior to Visit  Medication Sig  . allopurinol (ZYLOPRIM) 300 MG tablet Take 300 mg by mouth daily.  [name] amLODipine (NORVASC) 5 MG tablet Take 5 mg by mouth daily.  [name] aspirin EC 81 MG tablet Take 81 mg by mouth daily.  . brimonidine (ALPHAGAN) 0.15 % ophthalmic solution Place 1 drop into both eyes 2 (two) times daily.  . Cholecalciferol (VITAMIN D3) 5000 UNITS TABS Take 5,000 Units by mouth daily.  . clopidogrel (PLAVIX) 75 MG tablet TAKE ONE TABLET BY MOUTH ONCE DAILY FOR HEART STENTS.  [name] dorzolamide-timolol (COSOPT) 22.3-6.8 MG/ML ophthalmic solution Place 1 drop into  both eyes 2 (two) times daily.  . fenofibrate micronized (LOFIBRA) 134 MG capsule TAKE ONE CAPSULE BY MOUTH ONCE DAILY  . furosemide (LASIX) 20 MG tablet Take 20 mg by mouth daily.  [name] loteprednol (LOTEMAX) 0.5 % ophthalmic suspension Place 1 drop into the right eye 3 (three) times daily.  . nepafenac (ILEVRO) 0.3 % ophthalmic suspension Place 1 drop into the right eye daily.  . pravastatin (PRAVACHOL) 40 MG tablet TAKE ONE TABLET BY MOUTH AT BEDTIME FOR  CHOLESTEROL   No current facility-administered medications on file prior to visit.    Current Problems (verified) Patient Active Problem List   Diagnosis Date Noted  . Anemia 08/19/2015  . CKD (chronic kidney disease) stage 4, GFR 15-29 ml/min (HCC) 08/19/2015  . BMI 26.0-26.9,adult 05/17/2015  . Medicare annual wellness visit, subsequent 05/17/2015  . Abdominal aortic aneurysm ([place]) 12/02/2013  . Medication management 09/01/2013  . Essential hypertension 07/17/2013  . Prediabetes 07/17/2013  . Gout   . Vitamin D deficiency   . 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 Carotid artery disease ([place]) 01/15/2013  . Right bundle branch block 01/15/2013  . Hyperlipidemia 01/15/2013  . Herniated lumbar intervertebral disc 06/06/2012   Allergies Allergies  Allergen Reactions  . Toprol Xl [Metoprolol Tartrate] Other (See Comments)    Bradycardia with beta blockers  . Lipitor [Atorvastatin]     Myalgias   . Coumadin [Warfarin Sodium] Rash  . Tape Rash    Clear tape  . Warfarin Rash   Surgical History: reviewed and unchanged Family History: reviewed and unchanged Social History: reviewed and unchanged   Objective:     Blood pressure (!) 146/70, pulse 81, temperature 97.7 F (36.5 C), resp. rate 14, height 6' 1.25" (1.861 m), weight 179 lb 3.2 oz (81.3 kg), SpO2 96 %. Body mass index is 23.48 kg/m.  General appearance: alert, no distress, WD/WN, male HEENT: normocephalic, sclerae anicteric, TMs pearly, nares patent, no discharge or erythema,  pharynx normal Oral cavity: MMM, no lesions Neck: supple, no lymphadenopathy, no thyromegaly, no masses Heart: RRR, normal S1, S2, no murmurs Lungs: CTA bilaterally, no wheezes, rhonchi, or rales Abdomen: +bs, soft, non tender, non distended, no masses, no hepatomegaly, no splenomegaly Musculoskeletal: nontender, no swelling, no obvious deformity Extremities: no edema, no cyanosis, no clubbing Pulses: 2+ symmetric, upper and lower extremities, normal cap refill Neurological: alert, oriented x 3, CN2-12 intact, strength normal upper extremities and lower extremities, sensation normal throughout, DTRs 2+ throughout, no cerebellar signs, gait normal Psychiatric: normal affect, behavior normal, pleasant    Vicie Mutters, PA-C   04/02/2017

## 2017-04-03 ENCOUNTER — Encounter (INDEPENDENT_AMBULATORY_CARE_PROVIDER_SITE_OTHER): Payer: Medicare Other | Admitting: Ophthalmology

## 2017-04-03 DIAGNOSIS — I10 Essential (primary) hypertension: Secondary | ICD-10-CM | POA: Diagnosis not present

## 2017-04-03 DIAGNOSIS — H35033 Hypertensive retinopathy, bilateral: Secondary | ICD-10-CM | POA: Diagnosis not present

## 2017-04-03 DIAGNOSIS — H34813 Central retinal vein occlusion, bilateral, with macular edema: Secondary | ICD-10-CM | POA: Diagnosis not present

## 2017-04-03 DIAGNOSIS — H34231 Retinal artery branch occlusion, right eye: Secondary | ICD-10-CM

## 2017-04-03 DIAGNOSIS — H43813 Vitreous degeneration, bilateral: Secondary | ICD-10-CM

## 2017-04-03 LAB — CBC WITH DIFFERENTIAL/PLATELET
BASOS PCT: 1 %
Basophils Absolute: 58 cells/uL (ref 0–200)
EOS ABS: 348 {cells}/uL (ref 15–500)
Eosinophils Relative: 6 %
HEMATOCRIT: 32.1 % — AB (ref 38.5–50.0)
HEMOGLOBIN: 10.7 g/dL — AB (ref 13.2–17.1)
LYMPHS ABS: 1108 {cells}/uL (ref 850–3900)
MCH: 29.7 pg (ref 27.0–33.0)
MCHC: 33.3 g/dL (ref 32.0–36.0)
MCV: 89.2 fL (ref 80.0–100.0)
MPV: 10.7 fL (ref 7.5–12.5)
Monocytes Relative: 5.7 %
Neutro Abs: 3956 cells/uL (ref 1500–7800)
Neutrophils Relative %: 68.2 %
PLATELETS: 205 10*3/uL (ref 140–400)
RBC: 3.6 10*6/uL — ABNORMAL LOW (ref 4.20–5.80)
RDW: 13.6 % (ref 11.0–15.0)
TOTAL LYMPHOCYTE: 19.1 %
WBC: 5.8 10*3/uL (ref 3.8–10.8)
WBCMIX: 331 {cells}/uL (ref 200–950)

## 2017-04-03 LAB — HEPATIC FUNCTION PANEL
AG Ratio: 1.9 (calc) (ref 1.0–2.5)
ALT: 14 U/L (ref 9–46)
AST: 34 U/L (ref 10–35)
Albumin: 3.9 g/dL (ref 3.6–5.1)
Alkaline phosphatase (APISO): 42 U/L (ref 40–115)
BILIRUBIN DIRECT: 0.2 mg/dL (ref 0.0–0.2)
GLOBULIN: 2.1 g/dL (ref 1.9–3.7)
Indirect Bilirubin: 0.4 mg/dL (calc) (ref 0.2–1.2)
Total Bilirubin: 0.6 mg/dL (ref 0.2–1.2)
Total Protein: 6 g/dL — ABNORMAL LOW (ref 6.1–8.1)

## 2017-04-03 LAB — LIPID PANEL
CHOL/HDL RATIO: 4.9 (calc) (ref <5.0)
CHOLESTEROL: 142 mg/dL (ref <200)
HDL: 29 mg/dL — ABNORMAL LOW (ref >40)
LDL Cholesterol (Calc): 83 mg/dL (calc)
Non-HDL Cholesterol (Calc): 113 mg/dL (calc) (ref <130)
TRIGLYCERIDES: 201 mg/dL — AB (ref <150)

## 2017-04-03 LAB — HIV ANTIBODY (ROUTINE TESTING W REFLEX): HIV 1&2 Ab, 4th Generation: NONREACTIVE

## 2017-04-03 LAB — MAGNESIUM: Magnesium: 2.3 mg/dL (ref 1.5–2.5)

## 2017-04-03 LAB — BASIC METABOLIC PANEL WITH GFR
BUN/Creatinine Ratio: 14 (calc) (ref 6–22)
BUN: 45 mg/dL — AB (ref 7–25)
CALCIUM: 9.4 mg/dL (ref 8.6–10.3)
CHLORIDE: 101 mmol/L (ref 98–110)
CO2: 31 mmol/L (ref 20–32)
Creat: 3.13 mg/dL — ABNORMAL HIGH (ref 0.70–1.18)
GFR, EST AFRICAN AMERICAN: 21 mL/min/{1.73_m2} — AB (ref >=60)
GFR, EST NON AFRICAN AMERICAN: 18 mL/min/{1.73_m2} — AB (ref >=60)
Glucose, Bld: 95 mg/dL (ref 65–99)
Potassium: 4.4 mmol/L (ref 3.5–5.3)
Sodium: 140 mmol/L (ref 135–146)

## 2017-04-03 LAB — HEPATITIS C ANTIBODY
Hepatitis C Ab: NONREACTIVE
SIGNAL TO CUT-OFF: 0 (ref <1.00)

## 2017-04-03 LAB — PSA: PSA: 2.7 ng/mL (ref <=4.0)

## 2017-04-03 LAB — C-REACTIVE PROTEIN: CRP: 2.4 mg/L (ref <8.0)

## 2017-04-03 LAB — TSH: TSH: 2.24 mIU/L (ref 0.40–4.50)

## 2017-04-03 NOTE — Progress Notes (Signed)
Pt's wife was made aware of lab results & voiced understanding. Pt's wife requested the lab results to be faxed to Dr. Donato Heinz

## 2017-04-04 DIAGNOSIS — I714 Abdominal aortic aneurysm, without rupture: Secondary | ICD-10-CM | POA: Diagnosis not present

## 2017-04-04 DIAGNOSIS — I129 Hypertensive chronic kidney disease with stage 1 through stage 4 chronic kidney disease, or unspecified chronic kidney disease: Secondary | ICD-10-CM | POA: Diagnosis not present

## 2017-04-04 DIAGNOSIS — E875 Hyperkalemia: Secondary | ICD-10-CM | POA: Diagnosis not present

## 2017-04-04 DIAGNOSIS — D631 Anemia in chronic kidney disease: Secondary | ICD-10-CM | POA: Diagnosis not present

## 2017-04-04 DIAGNOSIS — N184 Chronic kidney disease, stage 4 (severe): Secondary | ICD-10-CM | POA: Diagnosis not present

## 2017-04-05 ENCOUNTER — Ambulatory Visit (HOSPITAL_COMMUNITY)
Admission: RE | Admit: 2017-04-05 | Discharge: 2017-04-05 | Disposition: A | Discharge status: Home / Self Care | Payer: Medicare Other | Source: Ambulatory Visit | Attending: Physician Assistant | Admitting: Physician Assistant

## 2017-04-05 DIAGNOSIS — R634 Abnormal weight loss: Secondary | ICD-10-CM

## 2017-04-05 DIAGNOSIS — Z6823 Body mass index (BMI) 23.0-23.9, adult: Secondary | ICD-10-CM | POA: Diagnosis not present

## 2017-04-05 DIAGNOSIS — I10 Essential (primary) hypertension: Secondary | ICD-10-CM | POA: Diagnosis not present

## 2017-04-08 NOTE — Progress Notes (Signed)
Pt aware of lab results & voiced understanding of those results.

## 2017-04-11 ENCOUNTER — Other Ambulatory Visit: Payer: Self-pay | Admitting: Internal Medicine

## 2017-04-12 ENCOUNTER — Ambulatory Visit: Payer: Self-pay | Admitting: Physician Assistant

## 2017-05-01 ENCOUNTER — Encounter (INDEPENDENT_AMBULATORY_CARE_PROVIDER_SITE_OTHER): Payer: Medicare Other | Admitting: Ophthalmology

## 2017-05-01 DIAGNOSIS — I10 Essential (primary) hypertension: Secondary | ICD-10-CM | POA: Diagnosis not present

## 2017-05-01 DIAGNOSIS — H35033 Hypertensive retinopathy, bilateral: Secondary | ICD-10-CM | POA: Diagnosis not present

## 2017-05-01 DIAGNOSIS — H348122 Central retinal vein occlusion, left eye, stable: Secondary | ICD-10-CM | POA: Diagnosis not present

## 2017-05-01 DIAGNOSIS — H43813 Vitreous degeneration, bilateral: Secondary | ICD-10-CM

## 2017-05-01 DIAGNOSIS — H34811 Central retinal vein occlusion, right eye, with macular edema: Secondary | ICD-10-CM

## 2017-05-13 ENCOUNTER — Encounter (INDEPENDENT_AMBULATORY_CARE_PROVIDER_SITE_OTHER): Payer: Medicare Other | Admitting: Ophthalmology

## 2017-05-27 ENCOUNTER — Other Ambulatory Visit: Payer: Self-pay | Admitting: Internal Medicine

## 2017-05-30 ENCOUNTER — Encounter (INDEPENDENT_AMBULATORY_CARE_PROVIDER_SITE_OTHER): Payer: Medicare Other | Admitting: Ophthalmology

## 2017-05-30 DIAGNOSIS — I10 Essential (primary) hypertension: Secondary | ICD-10-CM | POA: Diagnosis not present

## 2017-05-30 DIAGNOSIS — H348122 Central retinal vein occlusion, left eye, stable: Secondary | ICD-10-CM

## 2017-05-30 DIAGNOSIS — H35033 Hypertensive retinopathy, bilateral: Secondary | ICD-10-CM | POA: Diagnosis not present

## 2017-05-30 DIAGNOSIS — H34231 Retinal artery branch occlusion, right eye: Secondary | ICD-10-CM

## 2017-05-30 DIAGNOSIS — H34811 Central retinal vein occlusion, right eye, with macular edema: Secondary | ICD-10-CM | POA: Diagnosis not present

## 2017-05-30 DIAGNOSIS — H43813 Vitreous degeneration, bilateral: Secondary | ICD-10-CM | POA: Diagnosis not present

## 2017-06-10 DIAGNOSIS — D631 Anemia in chronic kidney disease: Secondary | ICD-10-CM | POA: Diagnosis not present

## 2017-06-10 DIAGNOSIS — N184 Chronic kidney disease, stage 4 (severe): Secondary | ICD-10-CM | POA: Diagnosis not present

## 2017-06-27 ENCOUNTER — Emergency Department (HOSPITAL_COMMUNITY): Payer: Medicare Other

## 2017-06-27 ENCOUNTER — Emergency Department (HOSPITAL_COMMUNITY)
Admission: EM | Admit: 2017-06-27 | Discharge: 2017-06-27 | Disposition: A | Discharge status: Home / Self Care | Payer: Medicare Other | Attending: Emergency Medicine | Admitting: Emergency Medicine

## 2017-06-27 ENCOUNTER — Other Ambulatory Visit: Payer: Self-pay | Admitting: Internal Medicine

## 2017-06-27 ENCOUNTER — Encounter (HOSPITAL_COMMUNITY): Payer: Self-pay

## 2017-06-27 DIAGNOSIS — S6392XA Sprain of unspecified part of left wrist and hand, initial encounter: Secondary | ICD-10-CM | POA: Diagnosis not present

## 2017-06-27 DIAGNOSIS — Y999 Unspecified external cause status: Secondary | ICD-10-CM | POA: Insufficient documentation

## 2017-06-27 DIAGNOSIS — S0083XA Contusion of other part of head, initial encounter: Secondary | ICD-10-CM

## 2017-06-27 DIAGNOSIS — R079 Chest pain, unspecified: Secondary | ICD-10-CM | POA: Diagnosis not present

## 2017-06-27 DIAGNOSIS — S199XXA Unspecified injury of neck, initial encounter: Secondary | ICD-10-CM | POA: Diagnosis not present

## 2017-06-27 DIAGNOSIS — W19XXXA Unspecified fall, initial encounter: Secondary | ICD-10-CM

## 2017-06-27 DIAGNOSIS — S63502A Unspecified sprain of left wrist, initial encounter: Secondary | ICD-10-CM | POA: Insufficient documentation

## 2017-06-27 DIAGNOSIS — S51012A Laceration without foreign body of left elbow, initial encounter: Secondary | ICD-10-CM | POA: Diagnosis not present

## 2017-06-27 DIAGNOSIS — I2510 Atherosclerotic heart disease of native coronary artery without angina pectoris: Secondary | ICD-10-CM | POA: Insufficient documentation

## 2017-06-27 DIAGNOSIS — I129 Hypertensive chronic kidney disease with stage 1 through stage 4 chronic kidney disease, or unspecified chronic kidney disease: Secondary | ICD-10-CM | POA: Diagnosis not present

## 2017-06-27 DIAGNOSIS — Z79899 Other long term (current) drug therapy: Secondary | ICD-10-CM | POA: Insufficient documentation

## 2017-06-27 DIAGNOSIS — N184 Chronic kidney disease, stage 4 (severe): Secondary | ICD-10-CM | POA: Diagnosis not present

## 2017-06-27 DIAGNOSIS — Y9389 Activity, other specified: Secondary | ICD-10-CM | POA: Insufficient documentation

## 2017-06-27 DIAGNOSIS — Z7982 Long term (current) use of aspirin: Secondary | ICD-10-CM | POA: Diagnosis not present

## 2017-06-27 DIAGNOSIS — M25532 Pain in left wrist: Secondary | ICD-10-CM | POA: Diagnosis not present

## 2017-06-27 DIAGNOSIS — M25512 Pain in left shoulder: Secondary | ICD-10-CM | POA: Diagnosis not present

## 2017-06-27 DIAGNOSIS — W010XXA Fall on same level from slipping, tripping and stumbling without subsequent striking against object, initial encounter: Secondary | ICD-10-CM | POA: Diagnosis not present

## 2017-06-27 DIAGNOSIS — Z96642 Presence of left artificial hip joint: Secondary | ICD-10-CM | POA: Diagnosis not present

## 2017-06-27 DIAGNOSIS — R001 Bradycardia, unspecified: Secondary | ICD-10-CM | POA: Diagnosis not present

## 2017-06-27 DIAGNOSIS — S6992XA Unspecified injury of left wrist, hand and finger(s), initial encounter: Secondary | ICD-10-CM | POA: Diagnosis not present

## 2017-06-27 DIAGNOSIS — Y92488 Other paved roadways as the place of occurrence of the external cause: Secondary | ICD-10-CM | POA: Insufficient documentation

## 2017-06-27 DIAGNOSIS — S0990XA Unspecified injury of head, initial encounter: Secondary | ICD-10-CM | POA: Diagnosis not present

## 2017-06-27 DIAGNOSIS — M7989 Other specified soft tissue disorders: Secondary | ICD-10-CM | POA: Diagnosis not present

## 2017-06-27 DIAGNOSIS — S81011A Laceration without foreign body, right knee, initial encounter: Secondary | ICD-10-CM | POA: Diagnosis not present

## 2017-06-27 DIAGNOSIS — S01112A Laceration without foreign body of left eyelid and periocular area, initial encounter: Secondary | ICD-10-CM | POA: Diagnosis not present

## 2017-06-27 LAB — CBC
HCT: 31 % — ABNORMAL LOW (ref 39.0–52.0)
Hemoglobin: 9.8 g/dL — ABNORMAL LOW (ref 13.0–17.0)
MCH: 28.6 pg (ref 26.0–34.0)
MCHC: 31.6 g/dL (ref 30.0–36.0)
MCV: 90.4 fL (ref 78.0–100.0)
PLATELETS: 194 10*3/uL (ref 150–400)
RBC: 3.43 MIL/uL — ABNORMAL LOW (ref 4.22–5.81)
RDW: 14.8 % (ref 11.5–15.5)
WBC: 5.7 10*3/uL (ref 4.0–10.5)

## 2017-06-27 LAB — BASIC METABOLIC PANEL
Anion gap: 9 (ref 5–15)
BUN: 48 mg/dL — AB (ref 6–20)
CO2: 29 mmol/L (ref 22–32)
CREATININE: 2.92 mg/dL — AB (ref 0.61–1.24)
Calcium: 9.3 mg/dL (ref 8.9–10.3)
Chloride: 99 mmol/L — ABNORMAL LOW (ref 101–111)
GFR calc Af Amer: 22 mL/min — ABNORMAL LOW (ref >60)
GFR, EST NON AFRICAN AMERICAN: 19 mL/min — AB (ref >60)
GLUCOSE: 194 mg/dL — AB (ref 65–99)
Potassium: 3.4 mmol/L — ABNORMAL LOW (ref 3.5–5.1)
SODIUM: 137 mmol/L (ref 135–145)

## 2017-06-27 LAB — I-STAT TROPONIN, ED: TROPONIN I, POC: 0.02 ng/mL (ref 0.00–0.08)

## 2017-06-27 MED ORDER — LIDOCAINE HCL 2 % IJ SOLN
20.0000 mL | Freq: Once | INTRAMUSCULAR | Status: AC
  Administered 2017-06-27: 400 mg
  Filled 2017-06-27: qty 20

## 2017-06-27 NOTE — ED Provider Notes (Signed)
[facility] EMERGENCY DEPARTMENT Provider Note   CSN: [id number] Arrival date & time: 06/27/17  1242     History   Chief Complaint Chief Complaint  Patient presents with  . Fall    HPI Terry Garner is a 77 y.o. male.  HPI   77 year old male presents status post fall.  Patient reports that suffered a fall today in the driveway.  He notes that his foot was caught at the end of the driveway causing him to fall on his left arm shoulder and chest.  Patient notes that he did strike his head, denies any loss of consciousness, denies any neurological deficits reports left-sided headache.  Patient reports pain to the left shoulder pain with range of motion, pain to the left wrist and abrasions on the hand.  Patient denies any significant chest pain or shortness of breath, denies any abdominal pain, denies any neurological deficits.  No medications prior to arrival.  Patient reports he takes Plavix.    Past Medical History:  Diagnosis Date  . AAA (abdominal aortic aneurysm) (HCC)    3.6 cm (01/09/12 ultrasound)  . Allergy   . Arthritis   . Carotid artery disease ([place])   . Cataract   . Chronic kidney disease    CKD, right renal artery stenosis  . Coronary artery disease    mild left internal carotid artery stenosis  . Glaucoma   . Gout   . Heart murmur   . Hyperlipidemia   . Hypertension   . Tubular adenoma of colon 2003   Dr. Watt Climes  . Vitamin D deficiency     Patient Active Problem List   Diagnosis Date Noted  . Anemia 08/19/2015  . CKD (chronic kidney disease) stage 4, GFR 15-29 ml/min (HCC) 08/19/2015  . BMI 26.0-26.9,adult 05/17/2015  . Medicare annual wellness visit, subsequent 05/17/2015  . Abdominal aortic aneurysm ([place]) 12/02/2013  . Medication management 09/01/2013  . Essential hypertension 07/17/2013  . Prediabetes 07/17/2013  . Gout   . Vitamin D deficiency   . 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 Carotid artery disease ([place]) 01/15/2013  . Right bundle  branch block 01/15/2013  . Hyperlipidemia 01/15/2013  . Herniated lumbar intervertebral disc 06/06/2012    Past Surgical History:  Procedure Laterality Date  . BACK SURGERY    . CARDIAC CATHETERIZATION     stents  last -07  . CARDIAC STENTS    . CERVICAL DISC SURGERY    . EYE SURGERY Left 06   retinal detachment  . HERNIA REPAIR Right 93  . LUMBAR LAMINECTOMY  06/06/2012   Procedure: MICRODISCECTOMY LUMBAR LAMINECTOMY;  Surgeon: Marybelle Killings, MD;  Location: [facility];  Service: Orthopedics;  Laterality: Left;  Left L4-5 Microdiscectomy  . SHOULDER ARTHROSCOPY Right   . TOTAL HIP ARTHROPLASTY Left        Home Medications    Prior to Admission medications   Medication Sig Start Date End Date Taking? Authorizing Provider  allopurinol (ZYLOPRIM) 300 MG tablet Take 300 mg by mouth daily.    [provider]  allopurinol (ZYLOPRIM) 300 MG tablet TAKE ONE TABLET BY MOUTH ONCE DAILY 05/27/17   [name], MD  amLODipine (NORVASC) 5 MG tablet Take 5 mg by mouth daily.    [provider]  aspirin EC 81 MG tablet Take 81 mg by mouth daily.    [provider]  brimonidine (ALPHAGAN) 0.15 % ophthalmic solution Place 1 drop into both eyes 2 (two) times daily.  [provider]  Cholecalciferol (VITAMIN D3) 5000 UNITS TABS Take 5,000 Units by mouth daily.    [provider]  clopidogrel (PLAVIX) 75 MG tablet TAKE 1 TABLET BY MOUTH ONCE DAILY FOR HEART STENTS 06/27/17   [name], MD  dorzolamide-timolol (COSOPT) 22.3-6.8 MG/ML ophthalmic solution Place 1 drop into both eyes 2 (two) times daily.    [provider]  fenofibrate micronized (LOFIBRA) 134 MG capsule TAKE ONE CAPSULE BY MOUTH ONCE DAILY 09/12/15   Forcucci, Loma Sousa, PA-C  fenofibrate micronized (LOFIBRA) 134 MG capsule TAKE 1 CAPSULE BY MOUTH ONCE DAILY 04/11/17   [name], MD  furosemide (LASIX) 20 MG tablet Take 20 mg by mouth daily.    [provider]    loteprednol (LOTEMAX) 0.5 % ophthalmic suspension Place 1 drop into the right eye 3 (three) times daily.    [provider]  nepafenac (ILEVRO) 0.3 % ophthalmic suspension Place 1 drop into the right eye daily.    [provider]  pravastatin (PRAVACHOL) 40 MG tablet TAKE ONE TABLET BY MOUTH AT BEDTIME FOR  CHOLESTEROL 03/25/17   [name], MD    Family History Family History  Problem Relation Age of Onset  . Other Brother        laryngeal cancer  . Heart disease Brother   . Heart disease Mother   . Heart disease Father   . Stroke Father   . Heart disease Sister   . Diabetes Brother   . Colon cancer Neg Hx   . Esophageal cancer Neg Hx   . Stomach cancer Neg Hx   . Rectal cancer Neg Hx     Social History Social History   Tobacco Use  . Smoking status: Never Smoker  . Smokeless tobacco: Never Used  Substance Use Topics  . Alcohol use: No  . Drug use: No     Allergies   Toprol xl [metoprolol tartrate]; Lipitor [atorvastatin]; Coumadin [warfarin sodium]; Tape; and Warfarin   Review of Systems Review of Systems  All other systems reviewed and are negative.   Physical Exam Updated Vital Signs BP (!) 147/67 (BP Location: Right Arm)   Pulse 70   Temp (!) 97.5 F (36.4 C) (Oral)   Resp 16   Ht 6\' 3"  (1.905 m)   Wt 81.6 kg (180 lb)   SpO2 98%   BMI 22.50 kg/m   Physical Exam  Constitutional: He is oriented to person, place, and time. He appears well-developed and well-nourished.  HENT:  Head: Normocephalic and atraumatic.  Eyes: Conjunctivae are normal. Pupils are equal, round, and reactive to light. Right eye exhibits no discharge. Left eye exhibits no discharge. No scleral icterus.  Neck: Normal range of motion. No JVD present. No tracheal deviation present.  Cardiovascular: Regular rhythm and normal heart sounds.  Pulmonary/Chest: Effort normal and breath sounds normal. No stridor. No respiratory distress. He has no wheezes. He has no  rales. He exhibits no tenderness.  Abdominal: Soft.  Musculoskeletal:  Minor tenderness palpation of the posterior cervical region, no thoracic or lumbar spinal tenderness.  Hips nontender stable with AP lateral compression  Left shoulder tender to palpation pain with range of motion, left elbow superficial abrasion full active range of motion of the elbow no significant swelling or edema.  Left wrist with swelling tenderness to palpation throughout.  Left hand with superficial abrasion over the dorsum, no significant bony tenderness full active range of motion    Neurological: He is alert and oriented to  person, place, and time. He displays normal reflexes. No cranial nerve deficit or sensory deficit. He exhibits normal muscle tone. Coordination normal.  Skin: Skin is warm.  Psychiatric: He has a normal mood and affect. His behavior is normal. Judgment and thought content normal.  Nursing note and vitals reviewed.    ED Treatments / Results  Labs (all labs ordered are listed, but only abnormal results are displayed) Labs Reviewed  BASIC METABOLIC PANEL - Abnormal; Notable for the following components:      Result Value   Potassium 3.4 (*)    Chloride 99 (*)    Glucose, Bld 194 (*)    BUN 48 (*)    Creatinine, Ser 2.92 (*)    GFR calc non Af Amer 19 (*)    GFR calc Af Amer 22 (*)    All other components within normal limits  CBC - Abnormal; Notable for the following components:   RBC 3.43 (*)    Hemoglobin 9.8 (*)    HCT 31.0 (*)    All other components within normal limits  I-STAT TROPONIN, ED    EKG  EKG Interpretation  Date/Time:  Thursday June 27 2017 13:25:55 EST Ventricular Rate:  44 PR Interval:  176 QRS Duration: 158 QT Interval:  516 QTC Calculation: 441 R Axis:   63 Text Interpretation:  Marked sinus bradycardia Right bundle branch block Abnormal ECG Confirmed by Quintella Reichert [phone]) on 06/27/2017 8:11:36 PM       Radiology Dg Chest 2 View  Result  Date: 06/27/2017 CLINICAL DATA:  Chest pain.  Recent fall with laceration to the leg. EXAM: CHEST  2 VIEW COMPARISON:  04/05/2017 FINDINGS: The heart size and mediastinal contours are within normal limits. There is aortic atherosclerosis. Both lungs are clear. The visualized skeletal structures are unremarkable. IMPRESSION: No active cardiopulmonary disease.  Aortic atherosclerosis. Electronically Signed   By: [place] Chimes M.D.   On: 06/27/2017 14:23   Dg Knee Complete 4 Views Right  Result Date: 06/27/2017 CLINICAL DATA:  [place] with laceration just below the knee he. EXAM: RIGHT KNEE - COMPLETE 4+ VIEW COMPARISON:  None. FINDINGS: Mild medial compartment osteoarthritis. No evidence of fracture or dislocation. No joint effusion. Regional arterial calcification is noted. IMPRESSION: No traumatic finding. Mild medial compartment osteoarthritis. Regional arterial calcification. Electronically Signed   By: [place] Chimes M.D.   On: 06/27/2017 14:24    Procedures .Marland KitchenLaceration Repair Date/Time: 06/27/2017 9:19 PM Performed by: Okey Regal, PA-C Authorized by: Okey Regal, PA-C   Consent:    Consent obtained:  Verbal   Consent given by:  Patient   Risks discussed:  Infection, pain and poor cosmetic result   Alternatives discussed:  No treatment, delayed treatment and observation Anesthesia (see MAR for exact dosages):    Anesthesia method:  Local infiltration   Local anesthetic:  Lidocaine 2% w/o epi Laceration details:    Location:  Face   Face location:  L eyebrow   Length (cm):  1 Repair type:    Repair type:  Simple Pre-procedure details:    Preparation:  Patient was prepped and draped in usual sterile fashion Exploration:    Hemostasis achieved with:  Direct pressure   Wound exploration: wound explored through full range of motion and entire depth of wound probed and visualized     Wound extent: no areolar tissue violation noted, no fascia violation noted, no foreign bodies/material  noted, no muscle damage noted, no nerve damage noted, no underlying fracture noted and  no vascular damage noted     Contaminated: no   Treatment:    Area cleansed with:  Saline   Amount of cleaning:  Standard   Irrigation solution:  Sterile saline   Visualized foreign bodies/material removed: no   Skin repair:    Repair method:  Sutures   Suture size:  5-0   Suture material:  Fast-absorbing gut   Number of sutures:  2 Approximation:    Approximation:  Close   [place] border: well-aligned   Post-procedure details:    Dressing:  Antibiotic ointment and non-adherent dressing   Patient tolerance of procedure:  Tolerated well, no immediate complications   (including critical care time)  Medications Ordered in ED Medications  lidocaine (XYLOCAINE) 2 % (with pres) injection 400 mg (400 mg Infiltration Given 06/27/17 1732)     Initial Impression / Assessment and Plan / ED Course  I have reviewed the triage vital signs and the nursing notes.  Pertinent labs & imaging results that were available during my care of the patient were reviewed by me and considered in my medical decision making (see chart for details).      Final Clinical Impressions(s) / ED Diagnoses   Final diagnoses:  Fall, initial encounter  Contusion of face, initial encounter  Sprain of left wrist, initial encounter  Skin tear of left elbow without complication, initial encounter    Labs: I-STAT troponin, BMP CBC  Imaging: DG chest 2 view, DG knee complete right, CT head without, CT cervical spine without, DG wrist left complete, DG shoulder left  Consults:  Therapeutics:  Discharge Meds:   Assessment/Plan: 77 year old male presents today status post fall.  This was a mechanical fall.  He has numerous soft tissue injuries, no acute neurological deficits reassuring CT scans and plain films.  Patient's wounds were thoroughly cleansed, his laceration was repaired, he was ambulated without dizziness,  difficulty ambulation.  Patient has reassuring workup here, he will be discharged with outpatient follow-up and strict return precautions.  Both patient and his wife verbalized understanding and agreement to today's plan had no further questions or concerns at the time of discharge.      ED Discharge Orders    None       Francee Gentile 06/27/17 2120    Quintella Reichert, MD 06/28/17 [phone]

## 2017-06-27 NOTE — ED Notes (Signed)
Patient also pointed out abrasion to patient's left knee.  Will add DG knee.

## 2017-06-27 NOTE — ED Notes (Signed)
ED Provider at bedside. 

## 2017-06-27 NOTE — ED Notes (Signed)
Wound to left forehead redressed due to oozing

## 2017-06-27 NOTE — Discharge Instructions (Signed)
Please read attached information. If you experience any new or worsening signs or symptoms please return to the emergency room for evaluation. Please follow-up with your primary care provider or specialist as discussed.  °

## 2017-06-27 NOTE — ED Triage Notes (Addendum)
Per PT, Pt is coming from home where he had a mechanical fall in the cement driveway. Reports being at the mailbox when his foot caught the end of the driveway. Pt hit his left hand, left elbow, and left eye. Denies any LOC. Alert and oriented x 4, but patient reports "I just don't feel right." Reports taking some Plavix and ASA today as prescribed.   Pt reports some left chest pain that is taking place secondary to the fall. Pt reports that he feels a knot on his chest. Noted to be SB on the monitor.

## 2017-07-02 ENCOUNTER — Other Ambulatory Visit (HOSPITAL_COMMUNITY): Payer: Self-pay

## 2017-07-03 ENCOUNTER — Ambulatory Visit (HOSPITAL_COMMUNITY)
Admission: RE | Admit: 2017-07-03 | Discharge: 2017-07-03 | Disposition: A | Discharge status: Home / Self Care | Payer: Medicare Other | Source: Ambulatory Visit | Attending: Nephrology | Admitting: Nephrology

## 2017-07-03 DIAGNOSIS — D631 Anemia in chronic kidney disease: Secondary | ICD-10-CM | POA: Diagnosis not present

## 2017-07-03 DIAGNOSIS — N189 Chronic kidney disease, unspecified: Secondary | ICD-10-CM | POA: Diagnosis not present

## 2017-07-03 MED ORDER — FERUMOXYTOL INJECTION 510 MG/17 ML
510.0000 mg | INTRAVENOUS | Status: DC
  Administered 2017-07-03: 510 mg via INTRAVENOUS
  Filled 2017-07-03: qty 17

## 2017-07-03 NOTE — Discharge Instructions (Signed)

## 2017-07-10 ENCOUNTER — Ambulatory Visit (HOSPITAL_COMMUNITY)
Admission: RE | Admit: 2017-07-10 | Discharge: 2017-07-10 | Disposition: A | Discharge status: Home / Self Care | Payer: Medicare Other | Source: Ambulatory Visit | Attending: Nephrology | Admitting: Nephrology

## 2017-07-10 DIAGNOSIS — D631 Anemia in chronic kidney disease: Secondary | ICD-10-CM | POA: Insufficient documentation

## 2017-07-10 DIAGNOSIS — N189 Chronic kidney disease, unspecified: Secondary | ICD-10-CM | POA: Diagnosis not present

## 2017-07-10 MED ORDER — FERUMOXYTOL INJECTION 510 MG/17 ML
510.0000 mg | INTRAVENOUS | Status: DC
  Administered 2017-07-10: 510 mg via INTRAVENOUS
  Filled 2017-07-10: qty 17

## 2017-07-11 ENCOUNTER — Encounter (INDEPENDENT_AMBULATORY_CARE_PROVIDER_SITE_OTHER): Payer: Medicare Other | Admitting: Ophthalmology

## 2017-07-11 DIAGNOSIS — H34811 Central retinal vein occlusion, right eye, with macular edema: Secondary | ICD-10-CM | POA: Diagnosis not present

## 2017-07-11 DIAGNOSIS — H43813 Vitreous degeneration, bilateral: Secondary | ICD-10-CM | POA: Diagnosis not present

## 2017-07-11 DIAGNOSIS — H35033 Hypertensive retinopathy, bilateral: Secondary | ICD-10-CM

## 2017-07-11 DIAGNOSIS — I10 Essential (primary) hypertension: Secondary | ICD-10-CM

## 2017-07-11 DIAGNOSIS — H348122 Central retinal vein occlusion, left eye, stable: Secondary | ICD-10-CM

## 2017-07-12 DIAGNOSIS — I129 Hypertensive chronic kidney disease with stage 1 through stage 4 chronic kidney disease, or unspecified chronic kidney disease: Secondary | ICD-10-CM | POA: Diagnosis not present

## 2017-07-12 DIAGNOSIS — D631 Anemia in chronic kidney disease: Secondary | ICD-10-CM | POA: Diagnosis not present

## 2017-07-12 DIAGNOSIS — N184 Chronic kidney disease, stage 4 (severe): Secondary | ICD-10-CM | POA: Diagnosis not present

## 2017-07-12 DIAGNOSIS — E875 Hyperkalemia: Secondary | ICD-10-CM | POA: Diagnosis not present

## 2017-07-15 ENCOUNTER — Ambulatory Visit: Payer: Medicare Other | Admitting: Internal Medicine

## 2017-07-15 ENCOUNTER — Encounter: Payer: Self-pay | Admitting: Internal Medicine

## 2017-07-15 VITALS — BP 128/66 | HR 56 | Temp 97.2°F | Resp 16 | Ht 73.25 in | Wt 170.2 lb

## 2017-07-15 DIAGNOSIS — I25709 Atherosclerosis of coronary artery bypass graft(s), unspecified, with unspecified angina pectoris: Secondary | ICD-10-CM

## 2017-07-15 DIAGNOSIS — Z79899 Other long term (current) drug therapy: Secondary | ICD-10-CM

## 2017-07-15 DIAGNOSIS — E559 Vitamin D deficiency, unspecified: Secondary | ICD-10-CM | POA: Diagnosis not present

## 2017-07-15 DIAGNOSIS — E782 Mixed hyperlipidemia: Secondary | ICD-10-CM | POA: Diagnosis not present

## 2017-07-15 DIAGNOSIS — R7309 Other abnormal glucose: Secondary | ICD-10-CM | POA: Diagnosis not present

## 2017-07-15 DIAGNOSIS — R7303 Prediabetes: Secondary | ICD-10-CM | POA: Diagnosis not present

## 2017-07-15 DIAGNOSIS — M1000 Idiopathic gout, unspecified site: Secondary | ICD-10-CM

## 2017-07-15 DIAGNOSIS — I10 Essential (primary) hypertension: Secondary | ICD-10-CM

## 2017-07-15 NOTE — Patient Instructions (Signed)

## 2017-07-15 NOTE — Progress Notes (Signed)
This very nice 77 y.o. MWM presents for 6 month follow up with Hypertension, ASHD, Hyperlipidemia, Pre-Diabetes and Vitamin D Deficiency. Patient has hx/o Gout controlled on Allopurinol.     Patient is treated for HTN  (1998) & BP has been controlled at home. Today's BP is at goal -  128/66.  Patient has ASHD s/p PCA/Stents x 3 (2006, 2007 & 2008) Patient had a Negative heart cath in 2010 following a false (+) Myoview. 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  Patient has had no complaints of any cardiac type chest pain, palpitations, dyspnea / orthopnea / PND, dizziness, claudication, or dependent edema.     Hyperlipidemia is controlled with diet & meds. Patient denies myalgias or other med SE's. Last Lipids were  Lab Results  Component Value Date   CHOL 142 04/02/2017   HDL 29 (L) 04/02/2017   LDLCALC 74 12/20/2016   TRIG 201 (H) 04/02/2017   CHOLHDL 4.9 04/02/2017      Also, the patient has history of PreDiabetes (A1c 6.0% / 2013) and has had no symptoms of reactive hypoglycemia, diabetic polys, paresthesias or visual blurring.  Last A1c was Normal & at goal: Lab Results  Component Value Date   HGBA1C 4.8 12/20/2016      Further, the patient also has history of Vitamin D Deficiency ("27"/2008) and supplements vitamin D without any suspected side-effects. Last vitamin D was near goal:  Lab Results  Component Value Date   VD25OH 18 12/20/2016   Current Outpatient Medications on File Prior to Visit  Medication Sig  . allopurinol (ZYLOPRIM) 300 MG tablet Take 300 mg by mouth daily.  [name] amLODipine (NORVASC) 5 MG tablet Take 5 mg by mouth daily.  [name] aspirin EC 81 MG tablet Take 81 mg by mouth daily.  . brimonidine (ALPHAGAN) 0.15 % ophthalmic solution Place 1 drop into both eyes 2 (two) times daily.  . Cholecalciferol (VITAMIN D3) 5000 UNITS TABS Take 5,000 Units by mouth  daily.  . clopidogrel (PLAVIX) 75 MG tablet TAKE 1 TABLET BY MOUTH ONCE DAILY FOR HEART STENTS  . dorzolamide-timolol (COSOPT) 22.3-6.8 MG/ML ophthalmic solution Place 1 drop into both eyes 2 (two) times daily.  . fenofibrate micronized (LOFIBRA) 134 MG capsule TAKE ONE CAPSULE BY MOUTH ONCE DAILY  . furosemide (LASIX) 20 MG tablet Take 40 mg by mouth daily.   [name] loteprednol (LOTEMAX) 0.5 % ophthalmic suspension Place 1 drop into the right eye 3 (three) times daily.  . nepafenac (ILEVRO) 0.3 % ophthalmic suspension Place 1 drop into the right eye daily.  . pravastatin (PRAVACHOL) 40 MG tablet TAKE ONE TABLET BY MOUTH AT BEDTIME FOR  CHOLESTEROL   No current facility-administered medications on file prior to visit.    Allergies  Allergen Reactions  . Toprol Xl [Metoprolol Tartrate] Other (See Comments)    Bradycardia with beta blockers  . Lipitor [Atorvastatin]     Myalgias   . Coumadin [Warfarin Sodium] Rash  . Tape Rash    Clear tape  . Warfarin Rash   PMHx:   Past Medical History:  Diagnosis Date  . AAA (abdominal aortic aneurysm) (HCC)    3.6 cm (01/09/12 ultrasound)  . Allergy   . Arthritis   . Carotid artery disease ([place])   . Cataract   . Chronic kidney disease    CKD,  right renal artery stenosis  . Coronary artery disease    mild left internal carotid artery stenosis  . Glaucoma   . Gout   . Heart murmur   . Hyperlipidemia   . Hypertension   . Tubular adenoma of colon 2003   Dr. Watt Climes  . Vitamin D deficiency    Immunization History  Administered Date(s) Administered  . Influenza Split 04/09/2013  . Influenza, High Dose Seasonal PF 03/12/2014, 03/16/2015, 03/28/2016, 04/02/2017  . Pneumococcal Conjugate-13 03/12/2014  . Pneumococcal-Unspecified 06/25/2005  . Td 06/25/2002   Past Surgical History:  Procedure Laterality Date  . BACK SURGERY    . CARDIAC CATHETERIZATION     stents  last -07  . CARDIAC STENTS    . CERVICAL DISC SURGERY    . EYE SURGERY Left  06   retinal detachment  . HERNIA REPAIR Right 93  . LUMBAR LAMINECTOMY  06/06/2012   Procedure: MICRODISCECTOMY LUMBAR LAMINECTOMY;  Surgeon: Marybelle Killings, MD;  Location: [facility];  Service: Orthopedics;  Laterality: Left;  Left L4-5 Microdiscectomy  . SHOULDER ARTHROSCOPY Right   . TOTAL HIP ARTHROPLASTY Left    FHx:    Reviewed / unchanged  SHx:    Reviewed / unchanged   Systems Review:  Constitutional: Denies fever, chills, wt changes, headaches, insomnia, fatigue, night sweats, change in appetite. Eyes: Denies redness, blurred vision, diplopia, discharge, itchy, watery eyes.  ENT: Denies discharge, congestion, post nasal drip, epistaxis, sore throat, earache, hearing loss, dental pain, tinnitus, vertigo, sinus pain, snoring.  CV: Denies chest pain, palpitations, irregular heartbeat, syncope, dyspnea, diaphoresis, orthopnea, PND, claudication or edema. Respiratory: denies cough, dyspnea, DOE, pleurisy, hoarseness, laryngitis, wheezing.  Gastrointestinal: Denies dysphagia, odynophagia, heartburn, reflux, water brash, abdominal pain or cramps, nausea, vomiting, bloating, diarrhea, constipation, hematemesis, melena, hematochezia  or hemorrhoids. Genitourinary: Denies dysuria, frequency, urgency, nocturia, hesitancy, discharge, hematuria or flank pain. Musculoskeletal: Denies arthralgias, myalgias, stiffness, jt. swelling, pain, limping or strain/sprain.  Skin: Denies pruritus, rash, hives, warts, acne, eczema or change in skin lesion(s). Neuro: No weakness, tremor, incoordination, spasms, paresthesia or pain. Psychiatric: Denies confusion, memory loss or sensory loss. Endo: Denies change in weight, skin or hair change.  Heme/Lymph: No excessive bleeding, bruising or enlarged lymph nodes.  Physical Exam  BP 128/66   Pulse (!) 56   Temp (!) 97.2 F (36.2 C)   Resp 16   Ht 6' 1.25" (1.861 m)   Wt 170 lb 3.2 oz (77.2 kg)   BMI 22.30 kg/m   Appears well nourished, well groomed  and  in no distress.  Eyes: PERRLA, EOMs, conjunctiva no swelling or erythema. Sinuses: No frontal/maxillary tenderness ENT/Mouth: EAC's clear, TM's nl w/o erythema, bulging. Nares clear w/o erythema, swelling, exudates. Oropharynx clear without erythema or exudates. Oral hygiene is good. Tongue normal, non obstructing. Hearing intact.  Neck: Supple. Thyroid nl. Car 2+/2+ without bruits, nodes or JVD. Chest: Respirations nl with BS clear & equal w/o rales, rhonchi, wheezing or stridor.  Cor: Heart sounds normal w/ regular rate and rhythm without sig. murmurs, gallops, clicks or rubs. Peripheral pulses normal and equal  without edema.  Abdomen: Soft & bowel sounds normal. Non-tender w/o guarding, rebound, hernias, masses or organomegaly.  Lymphatics: Unremarkable.  Musculoskeletal: Full ROM all peripheral extremities, joint stability, 5/5 strength and normal gait.  Skin: Warm, dry without exposed rashes, lesions or ecchymosis apparent.  Neuro: Cranial nerves intact, reflexes equal bilaterally. Sensory-motor testing grossly intact. Tendon reflexes grossly intact.  Pysch: Alert & oriented x  3.  Insight and judgement nl & appropriate. No ideations.  Assessment and Plan:  1. Essential hypertension  - Continue medication, monitor blood pressure at home.  - Continue DASH diet. Reminder to go to the ER if any CP,  SOB, nausea, dizziness, severe HA, changes vision/speech.  - CBC with Differential/Platelet - BASIC METABOLIC PANEL WITH GFR - Magnesium - TSH  2. Hyperlipidemia, mixed  - Continue diet/meds, exercise,& lifestyle modifications.  - Continue monitor periodic cholesterol/liver & renal functions   - Hepatic function panel - Lipid panel - TSH  3. Prediabetes  - Continue diet, exercise, lifestyle modifications.  - Monitor appropriate labs.  - Hemoglobin A1c - Insulin, random  4. Vitamin D deficiency  - Continue supplementation.  - VITAMIN D 25 Hydroxy  5. Abnormal  glucose  - Hemoglobin A1c - Insulin, random  6. Idiopathic gout  - Uric acid  7. Coronary artery disease involving coronary bypass graft of native heart with angina pectoris (HCC)  - Lipid panel  8. Medication management  - CBC with Differential/Platelet - BASIC METABOLIC PANEL WITH GFR - Hepatic function panel - Magnesium - Lipid panel - TSH - Hemoglobin A1c - Insulin, random - VITAMIN D 25 Hydroxy  - Uric acid      Discussed  regular exercise, BP monitoring, weight control to achieve/maintain BMI less than 25 and discussed med and SE's. Recommended labs to assess and monitor clinical status with further disposition pending results of labs. Over 30 minutes of exam, counseling, chart review was performed.

## 2017-07-16 LAB — HEMOGLOBIN A1C
Hgb A1c MFr Bld: 4.9 % of total Hgb (ref <5.7)
Mean Plasma Glucose: 94 (calc)
eAG (mmol/L): 5.2 (calc)

## 2017-07-16 LAB — CBC WITH DIFFERENTIAL/PLATELET
BASOS ABS: 58 {cells}/uL (ref 0–200)
BASOS PCT: 1 %
Eosinophils Absolute: 151 cells/uL (ref 15–500)
Eosinophils Relative: 2.6 %
HCT: 31 % — ABNORMAL LOW (ref 38.5–50.0)
Hemoglobin: 10.5 g/dL — ABNORMAL LOW (ref 13.2–17.1)
Lymphs Abs: 795 cells/uL — ABNORMAL LOW (ref 850–3900)
MCH: 30.4 pg (ref 27.0–33.0)
MCHC: 33.9 g/dL (ref 32.0–36.0)
MCV: 89.9 fL (ref 80.0–100.0)
MONOS PCT: 7.3 %
MPV: 11 fL (ref 7.5–12.5)
NEUTROS PCT: 75.4 %
Neutro Abs: 4373 cells/uL (ref 1500–7800)
PLATELETS: 210 10*3/uL (ref 140–400)
RBC: 3.45 10*6/uL — ABNORMAL LOW (ref 4.20–5.80)
RDW: 14.3 % (ref 11.0–15.0)
TOTAL LYMPHOCYTE: 13.7 %
WBC mixed population: 423 cells/uL (ref 200–950)
WBC: 5.8 10*3/uL (ref 3.8–10.8)

## 2017-07-16 LAB — HEPATIC FUNCTION PANEL
AG RATIO: 1.6 (calc) (ref 1.0–2.5)
ALBUMIN MSPROF: 3.7 g/dL (ref 3.6–5.1)
ALT: 10 U/L (ref 9–46)
AST: 27 U/L (ref 10–35)
Alkaline phosphatase (APISO): 48 U/L (ref 40–115)
BILIRUBIN TOTAL: 0.8 mg/dL (ref 0.2–1.2)
Bilirubin, Direct: 0.3 mg/dL — ABNORMAL HIGH (ref 0.0–0.2)
Globulin: 2.3 g/dL (calc) (ref 1.9–3.7)
Indirect Bilirubin: 0.5 mg/dL (calc) (ref 0.2–1.2)
Total Protein: 6 g/dL — ABNORMAL LOW (ref 6.1–8.1)

## 2017-07-16 LAB — BASIC METABOLIC PANEL WITH GFR
BUN/Creatinine Ratio: 18 (calc) (ref 6–22)
BUN: 49 mg/dL — ABNORMAL HIGH (ref 7–25)
CALCIUM: 10.1 mg/dL (ref 8.6–10.3)
CO2: 33 mmol/L — AB (ref 20–32)
CREATININE: 2.8 mg/dL — AB (ref 0.70–1.18)
Chloride: 102 mmol/L (ref 98–110)
GFR, EST NON AFRICAN AMERICAN: 21 mL/min/{1.73_m2} — AB (ref >=60)
GFR, Est African American: 24 mL/min/{1.73_m2} — ABNORMAL LOW (ref >=60)
Glucose, Bld: 94 mg/dL (ref 65–99)
Potassium: 4.8 mmol/L (ref 3.5–5.3)
Sodium: 143 mmol/L (ref 135–146)

## 2017-07-16 LAB — LIPID PANEL
CHOL/HDL RATIO: 4.7 (calc) (ref <5.0)
CHOLESTEROL: 121 mg/dL (ref <200)
HDL: 26 mg/dL — AB (ref >40)
LDL Cholesterol (Calc): 73 mg/dL (calc)
NON-HDL CHOLESTEROL (CALC): 95 mg/dL (ref <130)
TRIGLYCERIDES: 132 mg/dL (ref <150)

## 2017-07-16 LAB — VITAMIN D 25 HYDROXY (VIT D DEFICIENCY, FRACTURES): VIT D 25 HYDROXY: 94 ng/mL (ref 30–100)

## 2017-07-16 LAB — TSH: TSH: 1.4 mIU/L (ref 0.40–4.50)

## 2017-07-16 LAB — URIC ACID: URIC ACID, SERUM: 7.5 mg/dL (ref 4.0–8.0)

## 2017-07-16 LAB — INSULIN, RANDOM: Insulin: 4.2 u[IU]/mL (ref 2.0–19.6)

## 2017-07-16 LAB — MAGNESIUM: Magnesium: 2.2 mg/dL (ref 1.5–2.5)

## 2017-08-19 ENCOUNTER — Encounter (INDEPENDENT_AMBULATORY_CARE_PROVIDER_SITE_OTHER): Payer: Medicare Other | Admitting: Ophthalmology

## 2017-08-19 DIAGNOSIS — H34813 Central retinal vein occlusion, bilateral, with macular edema: Secondary | ICD-10-CM | POA: Diagnosis not present

## 2017-08-19 DIAGNOSIS — H34231 Retinal artery branch occlusion, right eye: Secondary | ICD-10-CM | POA: Diagnosis not present

## 2017-08-19 DIAGNOSIS — H35033 Hypertensive retinopathy, bilateral: Secondary | ICD-10-CM

## 2017-08-19 DIAGNOSIS — H35373 Puckering of macula, bilateral: Secondary | ICD-10-CM | POA: Diagnosis not present

## 2017-08-19 DIAGNOSIS — H43813 Vitreous degeneration, bilateral: Secondary | ICD-10-CM

## 2017-08-19 DIAGNOSIS — I10 Essential (primary) hypertension: Secondary | ICD-10-CM

## 2017-08-20 DIAGNOSIS — L570 Actinic keratosis: Secondary | ICD-10-CM | POA: Diagnosis not present

## 2017-08-20 DIAGNOSIS — L821 Other seborrheic keratosis: Secondary | ICD-10-CM | POA: Diagnosis not present

## 2017-08-20 DIAGNOSIS — D1801 Hemangioma of skin and subcutaneous tissue: Secondary | ICD-10-CM | POA: Diagnosis not present

## 2017-08-20 DIAGNOSIS — D225 Melanocytic nevi of trunk: Secondary | ICD-10-CM | POA: Diagnosis not present

## 2017-08-21 ENCOUNTER — Other Ambulatory Visit: Payer: Self-pay | Admitting: Cardiovascular Disease

## 2017-08-21 DIAGNOSIS — I6523 Occlusion and stenosis of bilateral carotid arteries: Secondary | ICD-10-CM

## 2017-08-22 ENCOUNTER — Encounter (INDEPENDENT_AMBULATORY_CARE_PROVIDER_SITE_OTHER): Payer: Medicare Other | Admitting: Ophthalmology

## 2017-08-22 DIAGNOSIS — Z961 Presence of intraocular lens: Secondary | ICD-10-CM | POA: Diagnosis not present

## 2017-08-22 DIAGNOSIS — H40013 Open angle with borderline findings, low risk, bilateral: Secondary | ICD-10-CM | POA: Diagnosis not present

## 2017-08-22 DIAGNOSIS — H35373 Puckering of macula, bilateral: Secondary | ICD-10-CM | POA: Diagnosis not present

## 2017-08-28 ENCOUNTER — Ambulatory Visit (HOSPITAL_COMMUNITY)
Admission: RE | Admit: 2017-08-28 | Discharge: 2017-08-28 | Disposition: A | Discharge status: Home / Self Care | Payer: Medicare Other | Source: Ambulatory Visit | Attending: Cardiovascular Disease | Admitting: Cardiovascular Disease

## 2017-08-28 ENCOUNTER — Ambulatory Visit (HOSPITAL_BASED_OUTPATIENT_CLINIC_OR_DEPARTMENT_OTHER)
Admission: RE | Admit: 2017-08-28 | Discharge: 2017-08-28 | Disposition: A | Discharge status: Home / Self Care | Payer: Medicare Other | Source: Ambulatory Visit | Attending: Cardiovascular Disease | Admitting: Cardiovascular Disease

## 2017-08-28 DIAGNOSIS — I714 Abdominal aortic aneurysm, without rupture, unspecified: Secondary | ICD-10-CM

## 2017-08-28 DIAGNOSIS — I6523 Occlusion and stenosis of bilateral carotid arteries: Secondary | ICD-10-CM

## 2017-08-29 ENCOUNTER — Other Ambulatory Visit: Payer: Self-pay | Admitting: Cardiovascular Disease

## 2017-08-29 DIAGNOSIS — I739 Peripheral vascular disease, unspecified: Secondary | ICD-10-CM

## 2017-08-29 DIAGNOSIS — I779 Disorder of arteries and arterioles, unspecified: Secondary | ICD-10-CM

## 2017-08-29 DIAGNOSIS — I714 Abdominal aortic aneurysm, without rupture, unspecified: Secondary | ICD-10-CM

## 2017-09-23 ENCOUNTER — Encounter (INDEPENDENT_AMBULATORY_CARE_PROVIDER_SITE_OTHER): Payer: Medicare Other | Admitting: Ophthalmology

## 2017-09-23 ENCOUNTER — Other Ambulatory Visit: Payer: Self-pay

## 2017-09-23 ENCOUNTER — Emergency Department (HOSPITAL_COMMUNITY): Payer: Medicare Other

## 2017-09-23 ENCOUNTER — Inpatient Hospital Stay (HOSPITAL_COMMUNITY)
Admission: EM | Admit: 2017-09-23 | Discharge: 2017-10-01 | DRG: 417 | Disposition: A | Discharge status: Home Health | Payer: Medicare Other | Attending: Internal Medicine | Admitting: Internal Medicine

## 2017-09-23 ENCOUNTER — Encounter (HOSPITAL_COMMUNITY): Payer: Self-pay

## 2017-09-23 DIAGNOSIS — D696 Thrombocytopenia, unspecified: Secondary | ICD-10-CM | POA: Diagnosis present

## 2017-09-23 DIAGNOSIS — I714 Abdominal aortic aneurysm, without rupture, unspecified: Secondary | ICD-10-CM | POA: Diagnosis present

## 2017-09-23 DIAGNOSIS — I4510 Unspecified right bundle-branch block: Secondary | ICD-10-CM | POA: Diagnosis present

## 2017-09-23 DIAGNOSIS — E876 Hypokalemia: Secondary | ICD-10-CM | POA: Diagnosis present

## 2017-09-23 DIAGNOSIS — R1013 Epigastric pain: Secondary | ICD-10-CM

## 2017-09-23 DIAGNOSIS — I739 Peripheral vascular disease, unspecified: Secondary | ICD-10-CM

## 2017-09-23 DIAGNOSIS — N184 Chronic kidney disease, stage 4 (severe): Secondary | ICD-10-CM | POA: Diagnosis not present

## 2017-09-23 DIAGNOSIS — H35033 Hypertensive retinopathy, bilateral: Secondary | ICD-10-CM | POA: Diagnosis not present

## 2017-09-23 DIAGNOSIS — Z955 Presence of coronary angioplasty implant and graft: Secondary | ICD-10-CM | POA: Diagnosis not present

## 2017-09-23 DIAGNOSIS — K8020 Calculus of gallbladder without cholecystitis without obstruction: Secondary | ICD-10-CM

## 2017-09-23 DIAGNOSIS — I129 Hypertensive chronic kidney disease with stage 1 through stage 4 chronic kidney disease, or unspecified chronic kidney disease: Secondary | ICD-10-CM | POA: Diagnosis present

## 2017-09-23 DIAGNOSIS — H548 Legal blindness, as defined in USA: Secondary | ICD-10-CM | POA: Diagnosis present

## 2017-09-23 DIAGNOSIS — I779 Disorder of arteries and arterioles, unspecified: Secondary | ICD-10-CM | POA: Diagnosis not present

## 2017-09-23 DIAGNOSIS — K838 Other specified diseases of biliary tract: Secondary | ICD-10-CM

## 2017-09-23 DIAGNOSIS — K810 Acute cholecystitis: Secondary | ICD-10-CM

## 2017-09-23 DIAGNOSIS — Z96611 Presence of right artificial shoulder joint: Secondary | ICD-10-CM | POA: Diagnosis not present

## 2017-09-23 DIAGNOSIS — H348122 Central retinal vein occlusion, left eye, stable: Secondary | ICD-10-CM

## 2017-09-23 DIAGNOSIS — H34811 Central retinal vein occlusion, right eye, with macular edema: Secondary | ICD-10-CM | POA: Diagnosis not present

## 2017-09-23 DIAGNOSIS — K8066 Calculus of gallbladder and bile duct with acute and chronic cholecystitis without obstruction: Secondary | ICD-10-CM | POA: Diagnosis not present

## 2017-09-23 DIAGNOSIS — E43 Unspecified severe protein-calorie malnutrition: Secondary | ICD-10-CM | POA: Diagnosis not present

## 2017-09-23 DIAGNOSIS — Z01810 Encounter for preprocedural cardiovascular examination: Secondary | ICD-10-CM | POA: Diagnosis not present

## 2017-09-23 DIAGNOSIS — R441 Visual hallucinations: Secondary | ICD-10-CM | POA: Diagnosis not present

## 2017-09-23 DIAGNOSIS — H43813 Vitreous degeneration, bilateral: Secondary | ICD-10-CM | POA: Diagnosis not present

## 2017-09-23 DIAGNOSIS — R609 Edema, unspecified: Secondary | ICD-10-CM | POA: Diagnosis not present

## 2017-09-23 DIAGNOSIS — I10 Essential (primary) hypertension: Secondary | ICD-10-CM | POA: Diagnosis present

## 2017-09-23 DIAGNOSIS — I351 Nonrheumatic aortic (valve) insufficiency: Secondary | ICD-10-CM | POA: Diagnosis not present

## 2017-09-23 DIAGNOSIS — E785 Hyperlipidemia, unspecified: Secondary | ICD-10-CM | POA: Diagnosis present

## 2017-09-23 DIAGNOSIS — I361 Nonrheumatic tricuspid (valve) insufficiency: Secondary | ICD-10-CM | POA: Diagnosis not present

## 2017-09-23 DIAGNOSIS — Z79899 Other long term (current) drug therapy: Secondary | ICD-10-CM

## 2017-09-23 DIAGNOSIS — F05 Delirium due to known physiological condition: Secondary | ICD-10-CM | POA: Diagnosis not present

## 2017-09-23 DIAGNOSIS — R1110 Vomiting, unspecified: Secondary | ICD-10-CM | POA: Diagnosis not present

## 2017-09-23 DIAGNOSIS — Z91048 Other nonmedicinal substance allergy status: Secondary | ICD-10-CM

## 2017-09-23 DIAGNOSIS — Z7902 Long term (current) use of antithrombotics/antiplatelets: Secondary | ICD-10-CM | POA: Diagnosis not present

## 2017-09-23 DIAGNOSIS — Z888 Allergy status to other drugs, medicaments and biological substances status: Secondary | ICD-10-CM

## 2017-09-23 DIAGNOSIS — Z96642 Presence of left artificial hip joint: Secondary | ICD-10-CM | POA: Diagnosis present

## 2017-09-23 DIAGNOSIS — K8042 Calculus of bile duct with acute cholecystitis without obstruction: Secondary | ICD-10-CM | POA: Diagnosis not present

## 2017-09-23 DIAGNOSIS — R0781 Pleurodynia: Secondary | ICD-10-CM

## 2017-09-23 DIAGNOSIS — D631 Anemia in chronic kidney disease: Secondary | ICD-10-CM | POA: Diagnosis present

## 2017-09-23 DIAGNOSIS — I2510 Atherosclerotic heart disease of native coronary artery without angina pectoris: Secondary | ICD-10-CM | POA: Diagnosis not present

## 2017-09-23 DIAGNOSIS — R945 Abnormal results of liver function studies: Secondary | ICD-10-CM

## 2017-09-23 DIAGNOSIS — R109 Unspecified abdominal pain: Secondary | ICD-10-CM | POA: Diagnosis present

## 2017-09-23 DIAGNOSIS — R079 Chest pain, unspecified: Secondary | ICD-10-CM | POA: Diagnosis not present

## 2017-09-23 DIAGNOSIS — R9389 Abnormal findings on diagnostic imaging of other specified body structures: Secondary | ICD-10-CM | POA: Diagnosis not present

## 2017-09-23 DIAGNOSIS — K812 Acute cholecystitis with chronic cholecystitis: Secondary | ICD-10-CM | POA: Diagnosis not present

## 2017-09-23 DIAGNOSIS — R0602 Shortness of breath: Secondary | ICD-10-CM | POA: Diagnosis not present

## 2017-09-23 DIAGNOSIS — R1011 Right upper quadrant pain: Secondary | ICD-10-CM | POA: Diagnosis not present

## 2017-09-23 DIAGNOSIS — Z6824 Body mass index (BMI) 24.0-24.9, adult: Secondary | ICD-10-CM | POA: Diagnosis not present

## 2017-09-23 DIAGNOSIS — K5730 Diverticulosis of large intestine without perforation or abscess without bleeding: Secondary | ICD-10-CM | POA: Diagnosis not present

## 2017-09-23 HISTORY — DX: Other specified diseases of biliary tract: K83.8

## 2017-09-23 LAB — BASIC METABOLIC PANEL
Anion gap: 11 (ref 5–15)
BUN: 27 mg/dL — AB (ref 6–20)
CHLORIDE: 103 mmol/L (ref 101–111)
CO2: 26 mmol/L (ref 22–32)
Calcium: 9.3 mg/dL (ref 8.9–10.3)
Creatinine, Ser: 2.59 mg/dL — ABNORMAL HIGH (ref 0.61–1.24)
GFR calc Af Amer: 26 mL/min — ABNORMAL LOW (ref >60)
GFR calc non Af Amer: 22 mL/min — ABNORMAL LOW (ref >60)
Glucose, Bld: 145 mg/dL — ABNORMAL HIGH (ref 65–99)
Potassium: 3.1 mmol/L — ABNORMAL LOW (ref 3.5–5.1)
SODIUM: 140 mmol/L (ref 135–145)

## 2017-09-23 LAB — CBC
HCT: 31.6 % — ABNORMAL LOW (ref 39.0–52.0)
Hemoglobin: 10.7 g/dL — ABNORMAL LOW (ref 13.0–17.0)
MCH: 31.2 pg (ref 26.0–34.0)
MCHC: 33.9 g/dL (ref 30.0–36.0)
MCV: 92.1 fL (ref 78.0–100.0)
Platelets: 199 10*3/uL (ref 150–400)
RBC: 3.43 MIL/uL — ABNORMAL LOW (ref 4.22–5.81)
RDW: 15 % (ref 11.5–15.5)
WBC: 9.7 10*3/uL (ref 4.0–10.5)

## 2017-09-23 LAB — MAGNESIUM: Magnesium: 1.6 mg/dL — ABNORMAL LOW (ref 1.7–2.4)

## 2017-09-23 LAB — HEPATIC FUNCTION PANEL
ALBUMIN: 3.5 g/dL (ref 3.5–5.0)
ALT: 53 U/L (ref 17–63)
AST: 163 U/L — AB (ref 15–41)
Alkaline Phosphatase: 66 U/L (ref 38–126)
BILIRUBIN TOTAL: 3.4 mg/dL — AB (ref 0.3–1.2)
Bilirubin, Direct: 2.2 mg/dL — ABNORMAL HIGH (ref 0.1–0.5)
Indirect Bilirubin: 1.2 mg/dL — ABNORMAL HIGH (ref 0.3–0.9)
Total Protein: 5.9 g/dL — ABNORMAL LOW (ref 6.5–8.1)

## 2017-09-23 LAB — I-STAT TROPONIN, ED: Troponin i, poc: 0.03 ng/mL (ref 0.00–0.08)

## 2017-09-23 LAB — LIPASE, BLOOD: Lipase: 28 U/L (ref 11–51)

## 2017-09-23 LAB — PROTIME-INR
INR: 1.17
PROTHROMBIN TIME: 14.8 s (ref 11.4–15.2)

## 2017-09-23 MED ORDER — POTASSIUM CHLORIDE CRYS ER 20 MEQ PO TBCR
40.0000 meq | EXTENDED_RELEASE_TABLET | Freq: Once | ORAL | Status: AC
  Administered 2017-09-23: 40 meq via ORAL
  Filled 2017-09-23: qty 2

## 2017-09-23 MED ORDER — AMLODIPINE BESYLATE 5 MG PO TABS
5.0000 mg | ORAL_TABLET | Freq: Every day | ORAL | Status: DC
Start: 2017-09-23 — End: 2017-10-01
  Administered 2017-09-23 – 2017-10-01 (×8): 5 mg via ORAL
  Filled 2017-09-23 (×8): qty 1

## 2017-09-23 MED ORDER — BRIMONIDINE TARTRATE 0.15 % OP SOLN
1.0000 [drp] | Freq: Two times a day (BID) | OPHTHALMIC | Status: DC
  Administered 2017-09-23 – 2017-10-01 (×15): 1 [drp] via OPHTHALMIC
  Filled 2017-09-23: qty 5

## 2017-09-23 MED ORDER — MORPHINE SULFATE (PF) 4 MG/ML IV SOLN
INTRAVENOUS | Status: AC
  Filled 2017-09-23: qty 1

## 2017-09-23 MED ORDER — NEPAFENAC 0.1 % OP SUSP
1.0000 [drp] | Freq: Every day | OPHTHALMIC | Status: DC
Start: 2017-09-23 — End: 2017-10-01
  Administered 2017-09-23 – 2017-10-01 (×8): 1 [drp] via OPHTHALMIC
  Filled 2017-09-23: qty 3

## 2017-09-23 MED ORDER — LOTEPREDNOL ETABONATE 0.5 % OP SUSP
1.0000 [drp] | Freq: Two times a day (BID) | OPHTHALMIC | Status: DC
  Administered 2017-09-23 – 2017-10-01 (×15): 1 [drp] via OPHTHALMIC
  Filled 2017-09-23: qty 5

## 2017-09-23 MED ORDER — ASPIRIN EC 81 MG PO TBEC
81.0000 mg | DELAYED_RELEASE_TABLET | Freq: Every day | ORAL | Status: DC
  Administered 2017-09-23 – 2017-10-01 (×8): 81 mg via ORAL
  Filled 2017-09-23 (×9): qty 1

## 2017-09-23 MED ORDER — MORPHINE SULFATE (PF) 4 MG/ML IV SOLN
4.0000 mg | INTRAVENOUS | Status: DC | PRN
  Administered 2017-09-23: 4 mg via INTRAVENOUS
  Filled 2017-09-23: qty 1

## 2017-09-23 MED ORDER — DORZOLAMIDE HCL-TIMOLOL MAL 2-0.5 % OP SOLN
1.0000 [drp] | Freq: Two times a day (BID) | OPHTHALMIC | Status: DC
  Administered 2017-09-23 – 2017-10-01 (×15): 1 [drp] via OPHTHALMIC
  Filled 2017-09-23: qty 10

## 2017-09-23 MED ORDER — ONDANSETRON HCL 4 MG/2ML IJ SOLN
4.0000 mg | Freq: Four times a day (QID) | INTRAMUSCULAR | Status: DC | PRN
  Administered 2017-09-23 – 2017-09-26 (×2): 4 mg via INTRAVENOUS
  Filled 2017-09-23: qty 2

## 2017-09-23 MED ORDER — MORPHINE SULFATE (PF) 4 MG/ML IV SOLN
2.0000 mg | Freq: Once | INTRAVENOUS | Status: AC
  Administered 2017-09-23: 2 mg via INTRAVENOUS
  Filled 2017-09-23: qty 1

## 2017-09-23 MED ORDER — VITAMIN D 1000 UNITS PO TABS
5000.0000 [IU] | ORAL_TABLET | Freq: Every day | ORAL | Status: DC
  Administered 2017-09-23 – 2017-10-01 (×8): 5000 [IU] via ORAL
  Filled 2017-09-23 (×8): qty 5

## 2017-09-23 MED ORDER — ALLOPURINOL 300 MG PO TABS
300.0000 mg | ORAL_TABLET | Freq: Every day | ORAL | Status: DC
  Administered 2017-09-23 – 2017-10-01 (×8): 300 mg via ORAL
  Filled 2017-09-23 (×8): qty 1

## 2017-09-23 MED ORDER — POTASSIUM CHLORIDE IN NACL 20-0.9 MEQ/L-% IV SOLN
INTRAVENOUS | Status: AC
  Administered 2017-09-24: 06:00:00 via INTRAVENOUS
  Filled 2017-09-23: qty 1000

## 2017-09-23 MED ORDER — ONDANSETRON HCL 4 MG PO TABS: 4.0000 mg | ORAL_TABLET | Freq: Four times a day (QID) | ORAL | Status: DC | PRN

## 2017-09-23 NOTE — ED Notes (Signed)
Patient transported to CT 

## 2017-09-23 NOTE — Consult Note (Signed)
Reason for Consult: gallstones Referring Physician: Hollie Salk Terry Garner is an 77 y.o. male.  HPI: 77 yo male with 1 month of abdominal pain. The pain has been intermittent and sometimes related to food. Yesterday the pain became worse than before after eating a cabbage/hamburger meal. He notes nausea and vomiting. He denies diarrhea. Initially, he thought he was having a heart attack because it was the worst pain he ever felt.  Past Medical History:  Diagnosis Date  . AAA (abdominal aortic aneurysm) (HCC)    3.6 cm (01/09/12 ultrasound)  . Allergy   . Arthritis   . Carotid artery disease ([place])   . Cataract   . Chronic kidney disease    CKD, right renal artery stenosis  . Coronary artery disease    mild left internal carotid artery stenosis  . Glaucoma   . Gout   . Heart murmur   . Hyperlipidemia   . Hypertension   . Tubular adenoma of colon 2003   Dr. Watt Climes  . Vitamin D deficiency     Past Surgical History:  Procedure Laterality Date  . BACK SURGERY    . CARDIAC CATHETERIZATION     stents  last -07  . CARDIAC STENTS    . CERVICAL DISC SURGERY    . EYE SURGERY Left 06   retinal detachment  . HERNIA REPAIR Right 93  . LUMBAR LAMINECTOMY  06/06/2012   Procedure: MICRODISCECTOMY LUMBAR LAMINECTOMY;  Surgeon: Marybelle Killings, MD;  Location: [facility];  Service: Orthopedics;  Laterality: Left;  Left L4-5 Microdiscectomy  . SHOULDER ARTHROSCOPY Right   . TOTAL HIP ARTHROPLASTY Left     Family History  Problem Relation Age of Onset  . Other Brother        laryngeal cancer  . Heart disease Brother   . Heart disease Mother   . Heart disease Father   . Stroke Father   . Heart disease Sister   . Diabetes Brother   . Colon cancer Neg Hx   . Esophageal cancer Neg Hx   . Stomach cancer Neg Hx   . Rectal cancer Neg Hx     Social History:  reports that he has never smoked. He has never used smokeless tobacco. He reports that he does not drink alcohol or use  drugs.  Allergies:  Allergies  Allergen Reactions  . Toprol Xl [Metoprolol Tartrate] Other (See Comments)    Bradycardia with beta blockers  . Lipitor [Atorvastatin]     Myalgias   . Coumadin [Warfarin Sodium] Rash  . Tape Rash    Clear tape  . Warfarin Rash    Medications: I have reviewed the patient's current medications.  Results for orders placed or performed during the hospital encounter of 09/23/17 (from the past 48 hour(s))  Basic metabolic panel     Status: Abnormal   Collection Time: 09/23/17 10:48 AM  Result Value Ref Range   Sodium 140 135 - 145 mmol/L   Potassium 3.1 (L) 3.5 - 5.1 mmol/L   Chloride 103 101 - 111 mmol/L   CO2 26 22 - 32 mmol/L   Glucose, Bld 145 (H) 65 - 99 mg/dL   BUN 27 (H) 6 - 20 mg/dL   Creatinine, Ser 2.59 (H) 0.61 - 1.24 mg/dL   Calcium 9.3 8.9 - 10.3 mg/dL   GFR calc non Af Amer 22 (L) >60 mL/min   GFR calc Af Amer 26 (L) >60 mL/min    Comment: (NOTE) The  eGFR has been calculated using the CKD EPI equation. This calculation has not been validated in all clinical situations. eGFR's persistently <60 mL/min signify possible Chronic Kidney Disease.    Anion gap 11 5 - 15    Comment: Performed at [facility] [address] [place], [place] [address]  CBC     Status: Abnormal   Collection Time: 09/23/17 10:48 AM  Result Value Ref Range   WBC 9.7 4.0 - 10.5 K/uL   RBC 3.43 (L) 4.22 - 5.81 MIL/uL   Hemoglobin 10.7 (L) 13.0 - 17.0 g/dL   HCT 31.6 (L) 39.0 - 52.0 %   MCV 92.1 78.0 - 100.0 fL   MCH 31.2 26.0 - 34.0 pg   MCHC 33.9 30.0 - 36.0 g/dL   RDW 15.0 11.5 - 15.5 %   Platelets 199 150 - 400 K/uL    Comment: Performed at [facility], [place] [address], [place], [place] [address]  Protime-INR (order if Patient is taking Coumadin / Warfarin)     Status: None   Collection Time: 09/23/17 10:48 AM  Result Value Ref Range   Prothrombin Time 14.8 11.4 - 15.2 seconds   INR 1.17     Comment: Performed at [facility] [address] [place], [place] [address]  I-stat troponin, ED     Status: None   Collection Time: 09/23/17 11:13 AM  Result Value Ref Range   Troponin i, poc 0.03 0.00 - 0.08 ng/mL   Comment 3            Comment: Due to the release kinetics of cTnI, a negative result within the first hours of the onset of symptoms does not rule out myocardial infarction with certainty. If myocardial infarction is still suspected, repeat the test at appropriate intervals.   Hepatic function panel     Status: Abnormal   Collection Time: 09/23/17 11:30 AM  Result Value Ref Range   Total Protein 5.9 (L) 6.5 - 8.1 g/dL   Albumin 3.5 3.5 - 5.0 g/dL   AST 163 (H) 15 - 41 U/L   ALT 53 17 - 63 U/L   Alkaline Phosphatase 66 38 - 126 U/L   Total Bilirubin 3.4 (H) 0.3 - 1.2 mg/dL   Bilirubin, Direct 2.2 (H) 0.1 - 0.5 mg/dL   Indirect Bilirubin 1.2 (H) 0.3 - 0.9 mg/dL    Comment: Performed at [facility] [address] [place], [place] [address]  Lipase, blood     Status: None   Collection Time: 09/23/17 11:30 AM  Result Value Ref Range   Lipase 28 11 - 51 U/L    Comment: Performed at [facility], [place] [address], [place], [place] [address]    Ct Abdomen Pelvis Wo Contrast  Result Date: 09/23/2017 CLINICAL DATA:  Abdominal pain.  Increasing abdominal pain EXAM: CT ABDOMEN AND PELVIS WITHOUT CONTRAST TECHNIQUE: Multidetector CT imaging of the abdomen and pelvis was performed following the standard protocol without IV contrast. COMPARISON:  None. FINDINGS: Lower chest: Mild bibasilar atelectasis. Hepatobiliary: No focal hepatic lesion on noncontrast exam. Gallbladder distended to 5.5 cm. No radiodense gallstones are present. Common bile duct appears normal caliber. Small amount of free fluid along the RIGHT hepatic margin Pancreas: Pancreas is normal. No ductal dilatation. No pancreatic inflammation. Spleen: Normal spleen Adrenals/urinary tract: Adrenal glands normal. No nephrolithiasis or  ureterolithiasis. Vascular calcification in the RIGHT renal hilum. No bladder calculi Stomach/Bowel: Stomach, small bowel, appendix,  and cecum are normal. Multiple sigmoid diverticula. No acute diverticulitis. Vascular/Lymphatic: Abdominal aorta is normal caliber with atherosclerotic calcification. There is no retroperitoneal or periportal lymphadenopathy. No pelvic lymphadenopathy. Reproductive: Prostate normal Other: No free fluid. Musculoskeletal: No aggressive osseous lesion. IMPRESSION: 1. Gallbladder distension without of evidence gallbladder inflammation. No radiodense gallstones. Consider ultrasound if concern for cholecystitis. 2. Small free fluid along the RIGHT hepatic lobe. 3. Extensive sigmoid diverticulosis without evidence diverticulitis. 4.  Aortic Atherosclerosis (ICD10-I70.0). Electronically Signed   By: Suzy Bouchard M.D.   On: 09/23/2017 15:33   US Abdomen Limited  Result Date: 09/23/2017 CLINICAL DATA:  Right upper quadrant pain for several days EXAM: ULTRASOUND ABDOMEN LIMITED RIGHT UPPER QUADRANT COMPARISON:  CT from earlier in the same day. FINDINGS: Gallbladder: Well distended with gallstones and sludge. No pericholecystic fluid or gallbladder wall thickening is noted. Negative sonographic Percell Miller sign is noted. Common bile duct: Diameter: 5.2 mm. Liver: No focal lesion identified. Within normal limits in parenchymal echogenicity. Portal vein is patent on color Doppler imaging with normal direction of blood flow towards the liver. IMPRESSION: Cholelithiasis without definitive complicating factors. This may still be the etiology of the patient's discomfort. Electronically Signed   By: Inez Catalina M.D.   On: 09/23/2017 16:49   Dg Chest Portable 1 View  Result Date: 09/23/2017 CLINICAL DATA:  Shortness of breath and pain EXAM: PORTABLE CHEST 1 VIEW COMPARISON:  June 27, 2017 FINDINGS: There is no edema or consolidation. Heart size and pulmonary vascularity are normal. No adenopathy.  No pneumothorax. There is aortic atherosclerosis. IMPRESSION: Aortic atherosclerosis.  No edema or consolidation. Aortic Atherosclerosis (ICD10-I70.0). Electronically Signed   By: Lowella Grip III M.D.   On: 09/23/2017 11:23    Review of Systems  Constitutional: Negative for chills and fever.  HENT: Negative for hearing loss.   Eyes: Negative for blurred vision and double vision.  Respiratory: Negative for cough and hemoptysis.   Cardiovascular: Negative for chest pain and palpitations.  Gastrointestinal: Positive for abdominal pain, nausea and vomiting.  Genitourinary: Negative for dysuria and urgency.  Musculoskeletal: Negative for myalgias and neck pain.  Skin: Negative for itching and rash.  Neurological: Negative for dizziness, tingling and headaches.  Endo/Heme/Allergies: Does not bruise/bleed easily.  Psychiatric/Behavioral: Negative for depression and suicidal ideas.   Blood pressure (!) 159/58, pulse 80, temperature 97.9 F (36.6 C), temperature source Oral, resp. rate 19, height '6\' 2"'$  (1.88 m), weight 77.1 kg (170 lb), SpO2 97 %. Physical Exam  Vitals reviewed. Constitutional: He is oriented to person, place, and time. He appears well-developed and well-nourished.  HENT:  Head: Normocephalic and atraumatic.  Eyes: Pupils are equal, round, and reactive to light. Conjunctivae and EOM are normal.  Neck: Normal range of motion. Neck supple.  Cardiovascular: Normal rate and regular rhythm.  Murmur heard.  Systolic murmur is present with a grade of 3/6. 1+ pitting edema  Respiratory: Effort normal and breath sounds normal.  GI: Soft. Bowel sounds are normal. He exhibits no distension. There is tenderness in the epigastric area. There is guarding and positive Murphy's sign.  Musculoskeletal: Normal range of motion.  Neurological: He is alert and oriented to person, place, and time.  Skin: Skin is warm and dry.  Psychiatric: He has a normal mood and affect. His behavior is  normal.    Assessment/Plan: 77 yo male with epigastric pain, gallstones and hyperbilirubinemia consistent with cholecystitis or choledocholithiasis. He has no enlarged common bile duct on imaging -repeat CMP in morning -allow  plavix to leave his system -surgery will continue to follow and will likely remove his gallbladder during this hospitalization -given CAD history and pitting edema, I recommend a cardiac evaluation prior to any surgery for stratification  Arta Bruce Kinsinger 09/23/2017, 7:18 PM

## 2017-09-23 NOTE — ED Triage Notes (Signed)
Pt c/o CP that started on Saturday, was feeling better on Sunday and has had increasing pain today in central chest.

## 2017-09-23 NOTE — ED Provider Notes (Signed)
[facility] EMERGENCY DEPARTMENT Provider Note   CSN: [id number] Arrival date & time: 09/23/17  1024     History   Chief Complaint Chief Complaint  Patient presents with  . Chest Pain   Chief complaint abdominal pain HPI ORVAL DORTCH is a 77 y.o. male.  Complains of generalized abdominal pain from subxiphoid area to lower abdomen onset 2 days ago pain is constant.  Associated symptoms include vomiting 8 times since onset of pain.  Pain is improved after vomiting.  Nothing makes symptoms worse.  Pain is moderate to severe at present no fever.  Last bowel movement this morning, he reports slight amount.  This pain does not feel like "heart pain" he has had in the past.  No shortness of breath.  He is not nauseated presently.  No treatment prior to coming here  HPI  Past Medical History:  Diagnosis Date  . AAA (abdominal aortic aneurysm) (HCC)    3.6 cm (01/09/12 ultrasound)  . Allergy   . Arthritis   . Carotid artery disease ([place])   . Cataract   . Chronic kidney disease    CKD, right renal artery stenosis  . Coronary artery disease    mild left internal carotid artery stenosis  . Glaucoma   . Gout   . Heart murmur   . Hyperlipidemia   . Hypertension   . Tubular adenoma of colon 2003   Dr. Watt Climes  . Vitamin D deficiency     Patient Active Problem List   Diagnosis Date Noted  . Anemia 08/19/2015  . CKD (chronic kidney disease) stage 4, GFR 15-29 ml/min (HCC) 08/19/2015  . BMI 26.0-26.9,adult 05/17/2015  . Medicare annual wellness visit, subsequent 05/17/2015  . Abdominal aortic aneurysm ([place]) 12/02/2013  . Medication management 09/01/2013  . Essential hypertension 07/17/2013  . Prediabetes 07/17/2013  . Gout   . Vitamin D deficiency   . 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 Carotid artery disease ([place]) 01/15/2013  . Right bundle branch block 01/15/2013  . Hyperlipidemia 01/15/2013  . Herniated lumbar intervertebral disc 06/06/2012    Past Surgical History:    Procedure Laterality Date  . BACK SURGERY    . CARDIAC CATHETERIZATION     stents  last -07  . CARDIAC STENTS    . CERVICAL DISC SURGERY    . EYE SURGERY Left 06   retinal detachment  . HERNIA REPAIR Right 93  . LUMBAR LAMINECTOMY  06/06/2012   Procedure: MICRODISCECTOMY LUMBAR LAMINECTOMY;  Surgeon: Marybelle Killings, MD;  Location: [facility];  Service: Orthopedics;  Laterality: Left;  Left L4-5 Microdiscectomy  . SHOULDER ARTHROSCOPY Right   . TOTAL HIP ARTHROPLASTY Left         Home Medications    Prior to Admission medications   Medication Sig Start Date End Date Taking? Authorizing Provider  allopurinol (ZYLOPRIM) 300 MG tablet Take 300 mg by mouth daily.    [provider]  amLODipine (NORVASC) 5 MG tablet Take 5 mg by mouth daily.    [provider]  aspirin EC 81 MG tablet Take 81 mg by mouth daily.    [provider]  brimonidine (ALPHAGAN) 0.15 % ophthalmic solution Place 1 drop into both eyes 2 (two) times daily.    [provider]  Cholecalciferol (VITAMIN D3) 5000 UNITS TABS Take 5,000 Units by mouth daily.    [provider]  clopidogrel (PLAVIX) 75 MG tablet TAKE 1 TABLET BY MOUTH ONCE DAILY FOR HEART STENTS 06/27/17  [name], MD  dorzolamide-timolol (COSOPT) 22.3-6.8 MG/ML ophthalmic solution Place 1 drop into both eyes 2 (two) times daily.    [provider]  fenofibrate micronized (LOFIBRA) 134 MG capsule TAKE ONE CAPSULE BY MOUTH ONCE DAILY 09/12/15   Forcucci, Courtney, PA-C  furosemide (LASIX) 20 MG tablet Take 40 mg by mouth daily.     [provider]  loteprednol (LOTEMAX) 0.5 % ophthalmic suspension Place 1 drop into the right eye 3 (three) times daily.    [provider]  nepafenac (ILEVRO) 0.3 % ophthalmic suspension Place 1 drop into the right eye daily.    [provider]  pravastatin (PRAVACHOL) 40 MG tablet TAKE ONE TABLET BY MOUTH AT BEDTIME FOR  CHOLESTEROL 03/25/17    [name], MD    Family History Family History  Problem Relation Age of Onset  . Other Brother        laryngeal cancer  . Heart disease Brother   . Heart disease Mother   . Heart disease Father   . Stroke Father   . Heart disease Sister   . Diabetes Brother   . Colon cancer Neg Hx   . Esophageal cancer Neg Hx   . Stomach cancer Neg Hx   . Rectal cancer Neg Hx     Social History Social History   Tobacco Use  . Smoking status: Never Smoker  . Smokeless tobacco: Never Used  Substance Use Topics  . Alcohol use: No  . Drug use: No     Allergies   Toprol xl [metoprolol tartrate]; Lipitor [atorvastatin]; Coumadin [warfarin sodium]; Tape; and Warfarin   Review of Systems Review of Systems  Constitutional: Negative.   HENT: Negative.   Respiratory: Negative.   Cardiovascular: Negative.   Gastrointestinal: Positive for abdominal pain and vomiting.  Musculoskeletal: Negative.   Skin: Negative.   Neurological: Negative.   Psychiatric/Behavioral: Negative.   All other systems reviewed and are negative.    Physical Exam Updated Vital Signs BP (!) 171/69   Pulse 78   Temp 97.9 F (36.6 C) (Oral)   Resp 16   Ht 6\' 2"  (1.88 m)   Wt 77.1 kg (170 lb)   SpO2 100%   BMI 21.83 kg/m   Physical Exam  Constitutional: He appears well-developed and well-nourished.  HENT:  Head: Normocephalic and atraumatic.  Eyes: Pupils are equal, round, and reactive to light. Conjunctivae are normal.  Neck: Neck supple. No tracheal deviation present. No thyromegaly present.  Cardiovascular: Normal rate and regular rhythm.  No murmur heard. Pulmonary/Chest: Effort normal and breath sounds normal.  Abdominal: Soft. Bowel sounds are normal. He exhibits no distension. There is tenderness.  Diffusely tender  Genitourinary:  Genitourinary Comments: Normal male genitalia  Musculoskeletal: Normal range of motion. He exhibits no edema or tenderness.  Neurological: He is alert.  Coordination normal.  Skin: Skin is warm and dry. No rash noted.  Psychiatric: He has a normal mood and affect.  Nursing note and vitals reviewed.    ED Treatments / Results  Labs (all labs ordered are listed, but only abnormal results are displayed) Labs Reviewed  CBC - Abnormal; Notable for the following components:      Result Value   RBC 3.43 (*)    Hemoglobin 10.7 (*)    HCT 31.6 (*)    All other components within normal limits  PROTIME-INR  BASIC METABOLIC PANEL  HEPATIC FUNCTION PANEL  LIPASE, BLOOD  I-STAT TROPONIN, ED   Results for orders placed  or performed during the hospital encounter of 59/16/38  Basic metabolic panel  Result Value Ref Range   Sodium 140 135 - 145 mmol/L   Potassium 3.1 (L) 3.5 - 5.1 mmol/L   Chloride 103 101 - 111 mmol/L   CO2 26 22 - 32 mmol/L   Glucose, Bld 145 (H) 65 - 99 mg/dL   BUN 27 (H) 6 - 20 mg/dL   Creatinine, Ser 2.59 (H) 0.61 - 1.24 mg/dL   Calcium 9.3 8.9 - 10.3 mg/dL   GFR calc non Af Amer 22 (L) >60 mL/min   GFR calc Af Amer 26 (L) >60 mL/min   Anion gap 11 5 - 15  CBC  Result Value Ref Range   WBC 9.7 4.0 - 10.5 K/uL   RBC 3.43 (L) 4.22 - 5.81 MIL/uL   Hemoglobin 10.7 (L) 13.0 - 17.0 g/dL   HCT 31.6 (L) 39.0 - 52.0 %   MCV 92.1 78.0 - 100.0 fL   MCH 31.2 26.0 - 34.0 pg   MCHC 33.9 30.0 - 36.0 g/dL   RDW 15.0 11.5 - 15.5 %   Platelets 199 150 - 400 K/uL  Protime-INR (order if Patient is taking Coumadin / Warfarin)  Result Value Ref Range   Prothrombin Time 14.8 11.4 - 15.2 seconds   INR 1.17   Hepatic function panel  Result Value Ref Range   Total Protein 5.9 (L) 6.5 - 8.1 g/dL   Albumin 3.5 3.5 - 5.0 g/dL   AST 163 (H) 15 - 41 U/L   ALT 53 17 - 63 U/L   Alkaline Phosphatase 66 38 - 126 U/L   Total Bilirubin 3.4 (H) 0.3 - 1.2 mg/dL   Bilirubin, Direct 2.2 (H) 0.1 - 0.5 mg/dL   Indirect Bilirubin 1.2 (H) 0.3 - 0.9 mg/dL  Lipase, blood  Result Value Ref Range   Lipase 28 11 - 51 U/L  I-stat troponin, ED    Result Value Ref Range   Troponin i, poc 0.03 0.00 - 0.08 ng/mL   Comment 3           Ct Abdomen Pelvis Wo Contrast  Result Date: 09/23/2017 CLINICAL DATA:  Abdominal pain.  Increasing abdominal pain EXAM: CT ABDOMEN AND PELVIS WITHOUT CONTRAST TECHNIQUE: Multidetector CT imaging of the abdomen and pelvis was performed following the standard protocol without IV contrast. COMPARISON:  None. FINDINGS: Lower chest: Mild bibasilar atelectasis. Hepatobiliary: No focal hepatic lesion on noncontrast exam. Gallbladder distended to 5.5 cm. No radiodense gallstones are present. Common bile duct appears normal caliber. Small amount of free fluid along the RIGHT hepatic margin Pancreas: Pancreas is normal. No ductal dilatation. No pancreatic inflammation. Spleen: Normal spleen Adrenals/urinary tract: Adrenal glands normal. No nephrolithiasis or ureterolithiasis. Vascular calcification in the RIGHT renal hilum. No bladder calculi Stomach/Bowel: Stomach, small bowel, appendix, and cecum are normal. Multiple sigmoid diverticula. No acute diverticulitis. Vascular/Lymphatic: Abdominal aorta is normal caliber with atherosclerotic calcification. There is no retroperitoneal or periportal lymphadenopathy. No pelvic lymphadenopathy. Reproductive: Prostate normal Other: No free fluid. Musculoskeletal: No aggressive osseous lesion. IMPRESSION: 1. Gallbladder distension without of evidence gallbladder inflammation. No radiodense gallstones. Consider ultrasound if concern for cholecystitis. 2. Small free fluid along the RIGHT hepatic lobe. 3. Extensive sigmoid diverticulosis without evidence diverticulitis. 4.  Aortic Atherosclerosis (ICD10-I70.0). Electronically Signed   By: Suzy Bouchard M.D.   On: 09/23/2017 15:33   US Abdomen Limited  Result Date: 09/23/2017 CLINICAL DATA:  Right upper quadrant pain for several days EXAM: ULTRASOUND ABDOMEN  LIMITED RIGHT UPPER QUADRANT COMPARISON:  CT from earlier in the same day. FINDINGS:  Gallbladder: Well distended with gallstones and sludge. No pericholecystic fluid or gallbladder wall thickening is noted. Negative sonographic Percell Miller sign is noted. Common bile duct: Diameter: 5.2 mm. Liver: No focal lesion identified. Within normal limits in parenchymal echogenicity. Portal vein is patent on color Doppler imaging with normal direction of blood flow towards the liver. IMPRESSION: Cholelithiasis without definitive complicating factors. This may still be the etiology of the patient's discomfort. Electronically Signed   By: Inez Catalina M.D.   On: 09/23/2017 16:49   Dg Chest Portable 1 View  Result Date: 09/23/2017 CLINICAL DATA:  Shortness of breath and pain EXAM: PORTABLE CHEST 1 VIEW COMPARISON:  June 27, 2017 FINDINGS: There is no edema or consolidation. Heart size and pulmonary vascularity are normal. No adenopathy. No pneumothorax. There is aortic atherosclerosis. IMPRESSION: Aortic atherosclerosis.  No edema or consolidation. Aortic Atherosclerosis (ICD10-I70.0). Electronically Signed   By: Lowella Grip III M.D.   On: 09/23/2017 11:23   EKG EKG Interpretation  Date/Time:  Monday September 23 2017 10:43:43 EDT Ventricular Rate:  62 PR Interval:  160 QRS Duration: 162 QT Interval:  438 QTC Calculation: 444 R Axis:     Text Interpretation:  Normal sinus rhythm with sinus arrhythmia Right bundle branch block No significant change since last tracing Abnormal ekg Confirmed by Carmin Muskrat [phone]) on 09/23/2017 10:47:55 AM   Radiology Dg Chest Portable 1 View  Result Date: 09/23/2017 CLINICAL DATA:  Shortness of breath and pain EXAM: PORTABLE CHEST 1 VIEW COMPARISON:  June 27, 2017 FINDINGS: There is no edema or consolidation. Heart size and pulmonary vascularity are normal. No adenopathy. No pneumothorax. There is aortic atherosclerosis. IMPRESSION: Aortic atherosclerosis.  No edema or consolidation. Aortic Atherosclerosis (ICD10-I70.0). Electronically Signed   By: Lowella Grip III M.D.   On: 09/23/2017 11:23   Chest x-ray viewed by me Procedures Procedures (including critical care time)  Medications Ordered in ED Medications  morphine 4 MG/ML injection 2 mg (has no administration in time range)     Initial Impression / Assessment and Plan / ED Course  I have reviewed the triage vital signs and the nursing notes.  Pertinent labs & imaging results that were available during my care of the patient were reviewed by me and considered in my medical decision making (see chart for details).     5 PM patient resting comfortably  I consulted general surgeon Dr. Kieth Brightly in light of gallbladder disease in this elderly patient with elevated LFTs and hyperbilirubinemia.  He suggested admission to medical service.  He will consult on case.  Patient requires repeat LFTs.[name]  Hospitalist service consulted to arrange for overnight stay Lab work consistent with hyperbilirubinemia, chronic renal insufficiency and hypokalemia.  Oral potassium supplementation ordered Final Clinical Impressions(s) / ED Diagnoses  Diagnosis #1 cholelithiasis #2 hyperbilirubinemia Final diagnoses:  None   #3 chronic renal insufficiency #4 hypokalemia ED Discharge Orders    None       Orlie Dakin, MD 09/23/17 1745

## 2017-09-23 NOTE — ED Notes (Signed)
RN attempted to call report to floor-Monique,RN

## 2017-09-23 NOTE — H&P (Signed)
History and Physical    Terry Garner ZOX:[id number] DOB: [birth year] DOA: 09/23/2017  PCP: Lucky Cowboy, MD  Patient coming from: Home  Chief Complaint: Abdominal pain  HPI: Terry Garner is a 77 y.o. male with medical history significant of AAA, chronic kidney disease, coronary artery disease comes in with about 2 days of worsening epigastric pain and right upper quadrant abdominal pain.  Patient has been vomiting and very nauseous with it.  He denies any fevers.  He denies any diarrhea.  He denies any association with food.  He still has his gallbladder.  Patient's feeling better after some IV morphine given in the ED.  Patient referred for admission for possible acute cholecystitis.  General surgery has been called and will be seen the patient.  Review of Systems: As per HPI otherwise 10 point review of systems negative.   Past Medical History:  Diagnosis Date  . AAA (abdominal aortic aneurysm) (HCC)    3.6 cm (01/09/12 ultrasound)  . Allergy   . Arthritis   . Carotid artery disease (HCC)   . Cataract   . Chronic kidney disease    CKD, right renal artery stenosis  . Coronary artery disease    mild left internal carotid artery stenosis  . Glaucoma   . Gout   . Heart murmur   . Hyperlipidemia   . Hypertension   . Tubular adenoma of colon 2003   Dr. Ewing Schlein  . Vitamin D deficiency     Past Surgical History:  Procedure Laterality Date  . BACK SURGERY    . CARDIAC CATHETERIZATION     stents  last -07  . CARDIAC STENTS    . CERVICAL DISC SURGERY    . EYE SURGERY Left 06   retinal detachment  . HERNIA REPAIR Right 93  . LUMBAR LAMINECTOMY  06/06/2012   Procedure: MICRODISCECTOMY LUMBAR LAMINECTOMY;  Surgeon: Eldred Manges, MD;  Location: [facility] OR;  Service: Orthopedics;  Laterality: Left;  Left L4-5 Microdiscectomy  . SHOULDER ARTHROSCOPY Right   . TOTAL HIP ARTHROPLASTY Left      reports that he has never smoked. He has never used smokeless tobacco. He reports that he  does not drink alcohol or use drugs.  Allergies  Allergen Reactions  . Toprol Xl [Metoprolol Tartrate] Other (See Comments)    Bradycardia with beta blockers  . Lipitor [Atorvastatin]     Myalgias   . Coumadin [Warfarin Sodium] Rash  . Tape Rash    Clear tape  . Warfarin Rash    Family History  Problem Relation Age of Onset  . Other Brother        laryngeal cancer  . Heart disease Brother   . Heart disease Mother   . Heart disease Father   . Stroke Father   . Heart disease Sister   . Diabetes Brother   . Colon cancer Neg Hx   . Esophageal cancer Neg Hx   . Stomach cancer Neg Hx   . Rectal cancer Neg Hx     Prior to Admission medications   Medication Sig Start Date End Date Taking? Authorizing Provider  allopurinol (ZYLOPRIM) 300 MG tablet Take 300 mg by mouth daily.   Yes [provider]  amLODipine (NORVASC) 5 MG tablet Take 5 mg by mouth daily.   Yes [provider]  aspirin EC 81 MG tablet Take 81 mg by mouth daily.   Yes [provider]  brimonidine (ALPHAGAN) 0.15 % ophthalmic solution Place 1  drop into both eyes 2 (two) times daily.   Yes [provider]  Cholecalciferol (VITAMIN D3) 5000 UNITS TABS Take 5,000 Units by mouth daily.   Yes [provider]  clopidogrel (PLAVIX) 75 MG tablet TAKE 1 TABLET BY MOUTH ONCE DAILY FOR HEART STENTS 06/27/17  Yes Lucky Cowboy, MD  dorzolamide-timolol (COSOPT) 22.3-6.8 MG/ML ophthalmic solution Place 1 drop into both eyes 2 (two) times daily.   Yes [provider]  fenofibrate micronized (LOFIBRA) 134 MG capsule TAKE ONE CAPSULE BY MOUTH ONCE DAILY 09/12/15  Yes Forcucci, Courtney, PA-C  furosemide (LASIX) 20 MG tablet Take 40 mg by mouth daily.    Yes [provider]  furosemide (LASIX) 40 MG tablet Take 40 mg by mouth daily. 08/29/17  Yes [provider]  loteprednol (LOTEMAX) 0.5 % ophthalmic suspension Place 1 drop into the right eye 2 (two) times daily.     Yes [provider]  nepafenac (ILEVRO) 0.3 % ophthalmic suspension Place 1 drop into the right eye daily.   Yes [provider]  pravastatin (PRAVACHOL) 40 MG tablet TAKE ONE TABLET BY MOUTH AT BEDTIME FOR  CHOLESTEROL 03/25/17  Yes Lucky Cowboy, MD    Physical Exam: Vitals:   09/23/17 1800 09/23/17 1815 09/23/17 1830 09/23/17 1845  BP: (!) 157/60 (!) 153/58 (!) 153/60 (!) 150/60  Pulse: 80 80 80 79  Resp: 19 20 (!) 21 20  Temp:      TempSrc:      SpO2: 95% 96% 96% 96%  Weight:      Height:        Constitutional: NAD, calm, comfortable Vitals:   09/23/17 1800 09/23/17 1815 09/23/17 1830 09/23/17 1845  BP: (!) 157/60 (!) 153/58 (!) 153/60 (!) 150/60  Pulse: 80 80 80 79  Resp: 19 20 (!) 21 20  Temp:      TempSrc:      SpO2: 95% 96% 96% 96%  Weight:      Height:       Eyes: PERRL, lids and conjunctivae normal ENMT: Mucous membranes are moist. Posterior pharynx clear of any exudate or lesions.Normal dentition.  Neck: normal, supple, no masses, no thyromegaly Respiratory: clear to auscultation bilaterally, no wheezing, no crackles. Normal respiratory effort. No accessory muscle use.  Cardiovascular: Regular rate and rhythm, no murmurs / rubs / gallops. No extremity edema. 2+ pedal pulses. No carotid bruits.  Abdomen: no tenderness, no masses palpated. No hepatosplenomegaly. Bowel sounds positive.  Musculoskeletal: no clubbing / cyanosis. No joint deformity upper and lower extremities. Good ROM, no contractures. Normal muscle tone.  Skin: no rashes, lesions, ulcers. No induration Neurologic: CN 2-12 grossly intact. Sensation intact, DTR normal. Strength 5/5 in all 4.  Psychiatric: Normal judgment and insight. Alert and oriented x 3. Normal mood.    Labs on Admission: I have personally reviewed following labs and imaging studies  CBC: Recent Labs  Lab 09/23/17 1048  WBC 9.7  HGB 10.7*  HCT 31.6*  MCV 92.1  PLT 199   Basic Metabolic Panel: Recent  Labs  Lab 09/23/17 1048  NA 140  K 3.1*  CL 103  CO2 26  GLUCOSE 145*  BUN 27*  CREATININE 2.59*  CALCIUM 9.3   GFR: Estimated Creatinine Clearance: 25.6 mL/min (A) (by C-G formula based on SCr of 2.59 mg/dL (H)). Liver Function Tests: Recent Labs  Lab 09/23/17 1130  AST 163*  ALT 53  ALKPHOS 66  BILITOT 3.4*  PROT 5.9*  ALBUMIN 3.5  Recent Labs  Lab 09/23/17 1130  LIPASE 28   No results for input(s): AMMONIA in the last 168 hours. Coagulation Profile: Recent Labs  Lab 09/23/17 1048  INR 1.17   Cardiac Enzymes: No results for input(s): CKTOTAL, CKMB, CKMBINDEX, TROPONINI in the last 168 hours. BNP (last 3 results) No results for input(s): PROBNP in the last 8760 hours. HbA1C: No results for input(s): HGBA1C in the last 72 hours. CBG: No results for input(s): GLUCAP in the last 168 hours. Lipid Profile: No results for input(s): CHOL, HDL, LDLCALC, TRIG, CHOLHDL, LDLDIRECT in the last 72 hours. Thyroid Function Tests: No results for input(s): TSH, T4TOTAL, FREET4, T3FREE, THYROIDAB in the last 72 hours. Anemia Panel: No results for input(s): VITAMINB12, FOLATE, FERRITIN, TIBC, IRON, RETICCTPCT in the last 72 hours. Urine analysis:    Component Value Date/Time   COLORURINE YELLOW 12/20/2016 0924   APPEARANCEUR CLEAR 12/20/2016 0924   LABSPEC 1.013 12/20/2016 0924   PHURINE 7.0 12/20/2016 0924   GLUCOSEU NEGATIVE 12/20/2016 0924   HGBUR NEGATIVE 12/20/2016 0924   BILIRUBINUR NEGATIVE 12/20/2016 0924   KETONESUR NEGATIVE 12/20/2016 0924   PROTEINUR NEGATIVE 12/20/2016 0924   UROBILINOGEN 0.2 11/15/2014 2052   NITRITE NEGATIVE 12/20/2016 0924   LEUKOCYTESUR NEGATIVE 12/20/2016 0924   Sepsis Labs: !!!!!!!!!!!!!!!!!!!!!!!!!!!!!!!!!!!!!!!!!!!! @LABRCNTIP (procalcitonin:4,lacticidven:4) )No results found for this or any previous visit (from the past 240 hour(s)).   Radiological Exams on Admission: Ct Abdomen Pelvis Wo Contrast  Result Date:  09/23/2017 CLINICAL DATA:  Abdominal pain.  Increasing abdominal pain EXAM: CT ABDOMEN AND PELVIS WITHOUT CONTRAST TECHNIQUE: Multidetector CT imaging of the abdomen and pelvis was performed following the standard protocol without IV contrast. COMPARISON:  None. FINDINGS: Lower chest: Mild bibasilar atelectasis. Hepatobiliary: No focal hepatic lesion on noncontrast exam. Gallbladder distended to 5.5 cm. No radiodense gallstones are present. Common bile duct appears normal caliber. Small amount of free fluid along the RIGHT hepatic margin Pancreas: Pancreas is normal. No ductal dilatation. No pancreatic inflammation. Spleen: Normal spleen Adrenals/urinary tract: Adrenal glands normal. No nephrolithiasis or ureterolithiasis. Vascular calcification in the RIGHT renal hilum. No bladder calculi Stomach/Bowel: Stomach, small bowel, appendix, and cecum are normal. Multiple sigmoid diverticula. No acute diverticulitis. Vascular/Lymphatic: Abdominal aorta is normal caliber with atherosclerotic calcification. There is no retroperitoneal or periportal lymphadenopathy. No pelvic lymphadenopathy. Reproductive: Prostate normal Other: No free fluid. Musculoskeletal: No aggressive osseous lesion. IMPRESSION: 1. Gallbladder distension without of evidence gallbladder inflammation. No radiodense gallstones. Consider ultrasound if concern for cholecystitis. 2. Small free fluid along the RIGHT hepatic lobe. 3. Extensive sigmoid diverticulosis without evidence diverticulitis. 4.  Aortic Atherosclerosis (ICD10-I70.0). Electronically Signed   By: Genevive Bi M.D.   On: 09/23/2017 15:33   US Abdomen Limited  Result Date: 09/23/2017 CLINICAL DATA:  Right upper quadrant pain for several days EXAM: ULTRASOUND ABDOMEN LIMITED RIGHT UPPER QUADRANT COMPARISON:  CT from earlier in the same day. FINDINGS: Gallbladder: Well distended with gallstones and sludge. No pericholecystic fluid or gallbladder wall thickening is noted. Negative  sonographic Eulah Pont sign is noted. Common bile duct: Diameter: 5.2 mm. Liver: No focal lesion identified. Within normal limits in parenchymal echogenicity. Portal vein is patent on color Doppler imaging with normal direction of blood flow towards the liver. IMPRESSION: Cholelithiasis without definitive complicating factors. This may still be the etiology of the patient's discomfort. Electronically Signed   By: Alcide Clever M.D.   On: 09/23/2017 16:49   Dg Chest Portable 1 View  Result Date: 09/23/2017 CLINICAL DATA:  Shortness of breath  and pain EXAM: PORTABLE CHEST 1 VIEW COMPARISON:  June 27, 2017 FINDINGS: There is no edema or consolidation. Heart size and pulmonary vascularity are normal. No adenopathy. No pneumothorax. There is aortic atherosclerosis. IMPRESSION: Aortic atherosclerosis.  No edema or consolidation. Aortic Atherosclerosis (ICD10-I70.0). Electronically Signed   By: Bretta Bang III M.D.   On: 09/23/2017 11:23    EKG: Independently reviewed.  Right bundle branch block old Old chart reviewed Case discussed with Dr. Charlotte Sanes in the ED  Assessment/Plan 77 year old male presents with acute abdominal pain concerning for acute choledocholithiasis/cholecystitis Principal Problem:   Abdominal pain-patient has elevated bilirubin and mildly elevated LFTs with an elevated lipase probably has passed a stone.  Will serial and monitor his LFTs.  Repeat lipase in the morning.  IV morphine as needed for pain.  Abdominal exam is benign.  General surgery consulted.  Will likely need his gallbladder out.  Hold Plavix.  Active Problems:   Carotid artery disease (HCC)-obtain cardiac echo   Right bundle branch block-stable   Essential hypertension-continue home meds   Abdominal aortic aneurysm (HCC)-stable per CT   CKD (chronic kidney disease) stage 4, GFR 15-29 ml/min (HCC)-at baseline creatinine     DVT prophylaxis: SCDs Code Status: Full Family Communication: None Disposition Plan: Per  day team Consults called: General surgery Admission status: Observation   Jahliyah Trice A MD [facility]  If 7PM-7AM, please contact night-coverage [email or web address] Password [email or web address]  09/23/2017, 7:09 PM

## 2017-09-24 ENCOUNTER — Encounter (HOSPITAL_COMMUNITY): Payer: Self-pay | Admitting: General Practice

## 2017-09-24 ENCOUNTER — Observation Stay (HOSPITAL_COMMUNITY): Payer: Medicare Other

## 2017-09-24 ENCOUNTER — Inpatient Hospital Stay (HOSPITAL_COMMUNITY): Payer: Medicare Other

## 2017-09-24 DIAGNOSIS — Z91048 Other nonmedicinal substance allergy status: Secondary | ICD-10-CM | POA: Diagnosis not present

## 2017-09-24 DIAGNOSIS — R1011 Right upper quadrant pain: Secondary | ICD-10-CM | POA: Diagnosis not present

## 2017-09-24 DIAGNOSIS — Z7902 Long term (current) use of antithrombotics/antiplatelets: Secondary | ICD-10-CM | POA: Diagnosis not present

## 2017-09-24 DIAGNOSIS — Z6824 Body mass index (BMI) 24.0-24.9, adult: Secondary | ICD-10-CM | POA: Diagnosis not present

## 2017-09-24 DIAGNOSIS — H548 Legal blindness, as defined in USA: Secondary | ICD-10-CM | POA: Diagnosis present

## 2017-09-24 DIAGNOSIS — I351 Nonrheumatic aortic (valve) insufficiency: Secondary | ICD-10-CM | POA: Diagnosis not present

## 2017-09-24 DIAGNOSIS — R945 Abnormal results of liver function studies: Secondary | ICD-10-CM

## 2017-09-24 DIAGNOSIS — Z79899 Other long term (current) drug therapy: Secondary | ICD-10-CM | POA: Diagnosis not present

## 2017-09-24 DIAGNOSIS — K8042 Calculus of bile duct with acute cholecystitis without obstruction: Secondary | ICD-10-CM

## 2017-09-24 DIAGNOSIS — I361 Nonrheumatic tricuspid (valve) insufficiency: Secondary | ICD-10-CM | POA: Diagnosis not present

## 2017-09-24 DIAGNOSIS — Z96642 Presence of left artificial hip joint: Secondary | ICD-10-CM | POA: Diagnosis present

## 2017-09-24 DIAGNOSIS — Z96611 Presence of right artificial shoulder joint: Secondary | ICD-10-CM | POA: Diagnosis present

## 2017-09-24 DIAGNOSIS — R441 Visual hallucinations: Secondary | ICD-10-CM | POA: Diagnosis not present

## 2017-09-24 DIAGNOSIS — D696 Thrombocytopenia, unspecified: Secondary | ICD-10-CM | POA: Diagnosis present

## 2017-09-24 DIAGNOSIS — Z888 Allergy status to other drugs, medicaments and biological substances status: Secondary | ICD-10-CM | POA: Diagnosis not present

## 2017-09-24 DIAGNOSIS — K8020 Calculus of gallbladder without cholecystitis without obstruction: Secondary | ICD-10-CM | POA: Diagnosis present

## 2017-09-24 DIAGNOSIS — F05 Delirium due to known physiological condition: Secondary | ICD-10-CM | POA: Diagnosis not present

## 2017-09-24 DIAGNOSIS — I714 Abdominal aortic aneurysm, without rupture: Secondary | ICD-10-CM | POA: Diagnosis present

## 2017-09-24 DIAGNOSIS — D631 Anemia in chronic kidney disease: Secondary | ICD-10-CM | POA: Diagnosis present

## 2017-09-24 DIAGNOSIS — I129 Hypertensive chronic kidney disease with stage 1 through stage 4 chronic kidney disease, or unspecified chronic kidney disease: Secondary | ICD-10-CM | POA: Diagnosis present

## 2017-09-24 DIAGNOSIS — K8066 Calculus of gallbladder and bile duct with acute and chronic cholecystitis without obstruction: Secondary | ICD-10-CM | POA: Diagnosis present

## 2017-09-24 DIAGNOSIS — N184 Chronic kidney disease, stage 4 (severe): Secondary | ICD-10-CM | POA: Diagnosis present

## 2017-09-24 DIAGNOSIS — Z955 Presence of coronary angioplasty implant and graft: Secondary | ICD-10-CM | POA: Diagnosis not present

## 2017-09-24 DIAGNOSIS — R609 Edema, unspecified: Secondary | ICD-10-CM | POA: Diagnosis present

## 2017-09-24 DIAGNOSIS — Z01810 Encounter for preprocedural cardiovascular examination: Secondary | ICD-10-CM | POA: Diagnosis not present

## 2017-09-24 DIAGNOSIS — E43 Unspecified severe protein-calorie malnutrition: Secondary | ICD-10-CM | POA: Diagnosis present

## 2017-09-24 DIAGNOSIS — I2510 Atherosclerotic heart disease of native coronary artery without angina pectoris: Secondary | ICD-10-CM | POA: Diagnosis present

## 2017-09-24 DIAGNOSIS — E876 Hypokalemia: Secondary | ICD-10-CM | POA: Diagnosis present

## 2017-09-24 DIAGNOSIS — I4510 Unspecified right bundle-branch block: Secondary | ICD-10-CM | POA: Diagnosis present

## 2017-09-24 DIAGNOSIS — E785 Hyperlipidemia, unspecified: Secondary | ICD-10-CM | POA: Diagnosis present

## 2017-09-24 DIAGNOSIS — K810 Acute cholecystitis: Secondary | ICD-10-CM | POA: Diagnosis not present

## 2017-09-24 LAB — CBC
HCT: 30.4 % — ABNORMAL LOW (ref 39.0–52.0)
Hemoglobin: 9.9 g/dL — ABNORMAL LOW (ref 13.0–17.0)
MCH: 29.7 pg (ref 26.0–34.0)
MCHC: 32.6 g/dL (ref 30.0–36.0)
MCV: 91.3 fL (ref 78.0–100.0)
PLATELETS: 135 10*3/uL — AB (ref 150–400)
RBC: 3.33 MIL/uL — ABNORMAL LOW (ref 4.22–5.81)
RDW: 14.8 % (ref 11.5–15.5)
WBC: 13.2 10*3/uL — AB (ref 4.0–10.5)

## 2017-09-24 LAB — ECHOCARDIOGRAM COMPLETE
HEIGHTINCHES: 75 in
Weight: 2793.67 oz

## 2017-09-24 LAB — COMPREHENSIVE METABOLIC PANEL
ALT: 70 U/L — AB (ref 17–63)
AST: 155 U/L — AB (ref 15–41)
Albumin: 2.7 g/dL — ABNORMAL LOW (ref 3.5–5.0)
Alkaline Phosphatase: 61 U/L (ref 38–126)
Anion gap: 13 (ref 5–15)
BUN: 28 mg/dL — AB (ref 6–20)
CALCIUM: 9 mg/dL (ref 8.9–10.3)
CHLORIDE: 101 mmol/L (ref 101–111)
CO2: 24 mmol/L (ref 22–32)
CREATININE: 2.41 mg/dL — AB (ref 0.61–1.24)
GFR calc Af Amer: 28 mL/min — ABNORMAL LOW (ref >60)
GFR, EST NON AFRICAN AMERICAN: 24 mL/min — AB (ref >60)
Glucose, Bld: 108 mg/dL — ABNORMAL HIGH (ref 65–99)
Potassium: 4.2 mmol/L (ref 3.5–5.1)
Sodium: 138 mmol/L (ref 135–145)
Total Bilirubin: 4.3 mg/dL — ABNORMAL HIGH (ref 0.3–1.2)
Total Protein: 5.1 g/dL — ABNORMAL LOW (ref 6.5–8.1)

## 2017-09-24 LAB — LIPASE, BLOOD: LIPASE: 17 U/L (ref 11–51)

## 2017-09-24 MED ORDER — CHLORHEXIDINE GLUCONATE 0.12 % MT SOLN
15.0000 mL | Freq: Two times a day (BID) | OROMUCOSAL | Status: DC
  Administered 2017-09-24 – 2017-09-28 (×8): 15 mL via OROMUCOSAL
  Filled 2017-09-24 (×7): qty 15

## 2017-09-24 MED ORDER — SODIUM CHLORIDE 0.9 % IV SOLN
3.0000 g | Freq: Two times a day (BID) | INTRAVENOUS | Status: DC
  Administered 2017-09-24 – 2017-09-26 (×5): 3 g via INTRAVENOUS
  Filled 2017-09-24 (×7): qty 3

## 2017-09-24 MED ORDER — POTASSIUM CHLORIDE IN NACL 20-0.9 MEQ/L-% IV SOLN
INTRAVENOUS | Status: DC
  Filled 2017-09-24: qty 1000

## 2017-09-24 MED ORDER — ORAL CARE MOUTH RINSE
15.0000 mL | Freq: Two times a day (BID) | OROMUCOSAL | Status: DC
  Administered 2017-09-24 – 2017-09-25 (×3): 15 mL via OROMUCOSAL

## 2017-09-24 MED ORDER — POTASSIUM CHLORIDE IN NACL 20-0.9 MEQ/L-% IV SOLN
INTRAVENOUS | Status: AC
  Administered 2017-09-24: 21:00:00 via INTRAVENOUS

## 2017-09-24 MED ORDER — POTASSIUM CHLORIDE IN NACL 20-0.9 MEQ/L-% IV SOLN: INTRAVENOUS | Status: DC

## 2017-09-24 MED ORDER — SODIUM CHLORIDE 0.9 % IV SOLN: 500.0000 mg | Freq: Two times a day (BID) | INTRAVENOUS | Status: DC

## 2017-09-24 NOTE — Consult Note (Addendum)
[facility] Consult: 10:31 AM 09/24/2017  LOS: 0 days    Referring Provider: Dr Jonelle Sidle, hospitalist  Primary Care Physician:  [name], MD Primary Gastroenterologist:  Lucio Edward, MD   Reason for Consultation:  Cholelithiasis, elevated LFTs, ? choledocholithiasis?   HPI: Terry Garner is a 77 y.o. male.  PMH AAA, stage 4 CKD.  CAD.  S/p cardiac stents.    Carotid artery dz.  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  Normocytic anemia.  S/p inguinal hernia repair. S/P  hip, shoulder, lumbar spine, opthalmologic surgery.   11/2013 Colonoscopy for diarrhea, IDA, wt loss, hx adenoma and HP polyps 2003: 5 mm, sessile transverse polyp was not retrieved.  Descending and sigmoid tics.  Internal hemorrhoids.  .   11/2013 EGD. Normal.    Beginning Saturday morning, 3/30, patient had attack of right upper quadrant pain with nausea, vomiting and some loose, brown stool.  He has been having similar but less severe attacks about once a month for the last several months.  By Sunday he was feeling better and was able to attend church service.  Monday he felt pretty good and went to see his ophthalmologist but that afternoon the GI symptoms returned and the pain was again quite severe along with bilious nausea and vomiting.  His wife brought him to the emergency department.  T bili 3.4 >> 4.3.  Alk phos 66 >> 61.  AST/ALT 163/53 >> 155/70.  Lipase 28 >> 17.  Persistant CKD, GFR  In 20s.   WBCs 9.7 >> 13.2.   Noncontrast CT: GB distended but not inflamed.  Small FF along right hepatic lobe.  Sigmoid tics.  Aortic vascular disease.   Abdominal ultrasound: uncomplicated cholelithiasis.  5.2 mm CBD.  Normal liver and portal vein circulation.  Unasyn initiated, first dose at 1130 this morning.  MRCP ordered by Dr [name].   He also suggest GI consult.     Today he is feeling better.  He last received morphine and Zofran yesterday evening at 7:30 PM.  Wife said he looked a little bit jaundiced yesterday.  To her eye the urine looks darker than usual.  Patient has no pruritus.  In between previous bouts of similar, but less intense, symptoms, his appetite is stable but generally he does not have a great appetite but that has been an issue for years.  He does not suffer heartburn, indigestion, dysphagia.  Weight is relatively stable.   Past Medical History:  Diagnosis Date  . AAA (abdominal aortic aneurysm) (HCC)    3.6 cm (01/09/12 ultrasound)  . Allergy   . Arthritis   . Carotid artery disease ([place])   . Cataract   . Chronic kidney disease    CKD, right renal artery stenosis  . Coronary artery disease    mild left internal carotid artery stenosis  . Glaucoma   . Gout   . Heart murmur   . Hyperlipidemia   . Hypertension   . Tubular adenoma of colon 2003   Dr. Watt Climes  . Vitamin D deficiency  Past Surgical History:  Procedure Laterality Date  . BACK SURGERY    . CARDIAC CATHETERIZATION     stents  last -07  . CARDIAC STENTS    . CERVICAL DISC SURGERY    . EYE SURGERY Left 06   retinal detachment  . HERNIA REPAIR Right 93  . LUMBAR LAMINECTOMY  06/06/2012   Procedure: MICRODISCECTOMY LUMBAR LAMINECTOMY;  Surgeon: Marybelle Killings, MD;  Location: [facility];  Service: Orthopedics;  Laterality: Left;  Left L4-5 Microdiscectomy  . SHOULDER ARTHROSCOPY Right   . TOTAL HIP ARTHROPLASTY Left     Prior to Admission medications   Medication Sig Start Date End Date Taking? Authorizing Provider  allopurinol (ZYLOPRIM) 300 MG tablet Take 300 mg by mouth daily.   Yes [provider]  amLODipine (NORVASC) 5 MG tablet Take 5 mg by mouth daily.   Yes [provider]  aspirin EC 81 MG tablet Take 81 mg by mouth daily.   Yes [provider]  brimonidine (ALPHAGAN) 0.15 % ophthalmic solution  Place 1 drop into both eyes 2 (two) times daily.   Yes [provider]  Cholecalciferol (VITAMIN D3) 5000 UNITS TABS Take 5,000 Units by mouth daily.   Yes [provider]  clopidogrel (PLAVIX) 75 MG tablet TAKE 1 TABLET BY MOUTH ONCE DAILY FOR HEART STENTS 06/27/17  Yes [name], MD  dorzolamide-timolol (COSOPT) 22.3-6.8 MG/ML ophthalmic solution Place 1 drop into both eyes 2 (two) times daily.   Yes [provider]  fenofibrate micronized (LOFIBRA) 134 MG capsule TAKE ONE CAPSULE BY MOUTH ONCE DAILY 09/12/15  Yes Forcucci, Courtney, PA-C  furosemide (LASIX) 20 MG tablet Take 40 mg by mouth daily.    Yes [provider]  furosemide (LASIX) 40 MG tablet Take 40 mg by mouth daily. 08/29/17  Yes [provider]  loteprednol (LOTEMAX) 0.5 % ophthalmic suspension Place 1 drop into the right eye 2 (two) times daily.    Yes [provider]  nepafenac (ILEVRO) 0.3 % ophthalmic suspension Place 1 drop into the right eye daily.   Yes [provider]  pravastatin (PRAVACHOL) 40 MG tablet TAKE ONE TABLET BY MOUTH AT BEDTIME FOR  CHOLESTEROL 03/25/17  Yes [name], MD    Scheduled Meds: . allopurinol  300 mg Oral Daily  . amLODipine  5 mg Oral Daily  . aspirin EC  81 mg Oral Daily  . brimonidine  1 drop Both Eyes BID  . chlorhexidine  15 mL Mouth Rinse BID  . cholecalciferol  5,000 Units Oral Daily  . dorzolamide-timolol  1 drop Both Eyes BID  . loteprednol  1 drop Right Eye BID  . mouth rinse  15 mL Mouth Rinse q12n4p  . nepafenac  1 drop Right Eye Daily   Infusions:  PRN Meds: morphine injection, ondansetron **OR** ondansetron (ZOFRAN) IV   Allergies as of 09/23/2017 - Review Complete 09/23/2017  Allergen Reaction Noted  . Toprol xl [metoprolol tartrate] Other (See Comments) 01/15/2013  . Lipitor [atorvastatin]  10/21/2013  . Coumadin [warfarin sodium] Rash 01/15/2013  . Tape Rash 06/04/2012  . Warfarin Rash 08/18/2015     Family History  Problem Relation Age of Onset  . Other Brother        laryngeal cancer  . Heart disease Brother   . Heart disease Mother   . Heart disease Father   . Stroke Father   . Heart disease Sister   . Diabetes Brother   . Colon cancer Neg  Hx   . Esophageal cancer Neg Hx   . Stomach cancer Neg Hx   . Rectal cancer Neg Hx     Social History   Socioeconomic History  . Marital status: Married    Spouse name: Not on file  . Number of children: Not on file  . Years of education: Not on file  . Highest education level: Not on file  Occupational History  . Not on file  Social Needs  . Financial resource strain: Not on file  . Food insecurity:    Worry: Not on file    Inability: Not on file  . Transportation needs:    Medical: Not on file    Non-medical: Not on file  Tobacco Use  . Smoking status: Never Smoker  . Smokeless tobacco: Never Used  Substance and Sexual Activity  . Alcohol use: No  . Drug use: No  . Sexual activity: Not on file  Lifestyle  . Physical activity:    Days per week: Not on file    Minutes per session: Not on file  . Stress: Not on file  Relationships  . Social connections:    Talks on phone: Not on file    Gets together: Not on file    Attends religious service: Not on file    Active member of club or organization: Not on file    Attends meetings of clubs or organizations: Not on file    Relationship status: Not on file  . Intimate partner violence:    Fear of current or ex partner: Not on file    Emotionally abused: Not on file    Physically abused: Not on file    Forced sexual activity: Not on file  Other Topics Concern  . Not on file  Social History Narrative  . Not on file    REVIEW OF SYSTEMS: Constitutional: Patient generally has good energy.  Does not complain of weakness.  With his recent GI illness, he feels tired. ENT:  No nose bleeds Pulm: Denies shortness of breath, denies cough. CV:  No palpitations, no LE  edema.  No chest pain. GU:  No hematuria, no frequency.  No oliguria.  Urine in the last 24 hours looks darker than normal. GI:  Per HPI.  Daily formed, brown stools.   Heme: Denies unusual bleeding or excessive bruising. Blood Transfusions: None that he can recall in recent years. Neuro:  No headaches, no peripheral tingling or numbness.  No syncope or seizures.  Unable to read or watch television due to vision loss. Derm:  No itching, no rash or sores.  Endocrine:  No sweats or chills.  No polyuria or dysuria Immunization: Extensive vaccination history reviewed.  He is up-to-date on his flu, pneumococcal and Td shots. Travel:  None beyond local counties in last few months.    PHYSICAL EXAM: Vital signs in last 24 hours: Vitals:   09/24/17 0755 09/24/17 0800  BP: (!) 123/58 (!) 128/52  Pulse: 60   Resp: 17   Temp: 98.2 F (36.8 C)   SpO2: 97%    Wt Readings from Last 3 Encounters:  09/23/17 174 lb 9.7 oz (79.2 kg)  07/15/17 170 lb 3.2 oz (77.2 kg)  07/10/17 180 lb (81.6 kg)    General: Very pleasant elderly WM.  He is comfortable, alert. Head: No facial asymmetry or swelling no signs of head trauma. Eyes: No scleral icterus.  No conjunctival pallor. Ears: Slightly HOH Nose: No congestion or discharge. Mouth: Moist,  clear oral mucosa.  Tongue midline. Neck: No JVD Lungs: Clear bilaterally.  No labored breathing or cough. Heart: RRR.  No MRG.  S1, S2 present. Abdomen: Soft.  Not tender or distended.  No organomegaly, bruits, hernias.  Bowel sounds active..   Rectal: Deferred Musc/Skeltl: No significant joint deformities or swelling.  No joint redness. Extremities: No CCE. Neurologic: Alert.  Oriented x3.  Moves all 4 limbs without tremor.  Strength not tested. Skin: No rash, sores, telangiectasia.  No significant bruising. Tattoos: None Nodes: No cervical or inguinal adenopathy. Psych: Pleasant, cooperative, calm.  Intake/Output from previous day: 04/01 0701 - 04/02  0700 In: 0  Out: 500 [Urine:500] Intake/Output this shift: No intake/output data recorded.  LAB RESULTS: Recent Labs    09/23/17 1048 09/24/17 0343  WBC 9.7 13.2*  HGB 10.7* 9.9*  HCT 31.6* 30.4*  PLT 199 135*   BMET Lab Results  Component Value Date   NA 138 09/24/2017   NA 140 09/23/2017   NA 143 07/15/2017   K 4.2 09/24/2017   K 3.1 (L) 09/23/2017   K 4.8 07/15/2017   CL 101 09/24/2017   CL 103 09/23/2017   CL 102 07/15/2017   CO2 24 09/24/2017   CO2 26 09/23/2017   CO2 33 (H) 07/15/2017   GLUCOSE 108 (H) 09/24/2017   GLUCOSE 145 (H) 09/23/2017   GLUCOSE 94 07/15/2017   BUN 28 (H) 09/24/2017   BUN 27 (H) 09/23/2017   BUN 49 (H) 07/15/2017   CREATININE 2.41 (H) 09/24/2017   CREATININE 2.59 (H) 09/23/2017   CREATININE 2.80 (H) 07/15/2017   CALCIUM 9.0 09/24/2017   CALCIUM 9.3 09/23/2017   CALCIUM 10.1 07/15/2017   LFT Recent Labs    09/23/17 1130 09/24/17 0343  PROT 5.9* 5.1*  ALBUMIN 3.5 2.7*  AST 163* 155*  ALT 53 70*  ALKPHOS 66 61  BILITOT 3.4* 4.3*  BILIDIR 2.2*  --   IBILI 1.2*  --    PT/INR Lab Results  Component Value Date   INR 1.17 09/23/2017   INR 1.11 06/04/2012   INR 1.03 06/09/2009   Hepatitis Panel No results for input(s): HEPBSAG, HCVAB, HEPAIGM, HEPBIGM in the last 72 hours. C-Diff No components found for: CDIFF Lipase     Component Value Date/Time   LIPASE 17 09/24/2017 0343    Drugs of Abuse  No results found for: LABOPIA, COCAINSCRNUR, LABBENZ, AMPHETMU, THCU, LABBARB   RADIOLOGY STUDIES: Ct Abdomen Pelvis Wo Contrast  Result Date: 09/23/2017 CLINICAL DATA:  Abdominal pain.  Increasing abdominal pain EXAM: CT ABDOMEN AND PELVIS WITHOUT CONTRAST TECHNIQUE: Multidetector CT imaging of the abdomen and pelvis was performed following the standard protocol without IV contrast. COMPARISON:  None. FINDINGS: Lower chest: Mild bibasilar atelectasis. Hepatobiliary: No focal hepatic lesion on noncontrast exam. Gallbladder  distended to 5.5 cm. No radiodense gallstones are present. Common bile duct appears normal caliber. Small amount of free fluid along the RIGHT hepatic margin Pancreas: Pancreas is normal. No ductal dilatation. No pancreatic inflammation. Spleen: Normal spleen Adrenals/urinary tract: Adrenal glands normal. No nephrolithiasis or ureterolithiasis. Vascular calcification in the RIGHT renal hilum. No bladder calculi Stomach/Bowel: Stomach, small bowel, appendix, and cecum are normal. Multiple sigmoid diverticula. No acute diverticulitis. Vascular/Lymphatic: Abdominal aorta is normal caliber with atherosclerotic calcification. There is no retroperitoneal or periportal lymphadenopathy. No pelvic lymphadenopathy. Reproductive: Prostate normal Other: No free fluid. Musculoskeletal: No aggressive osseous lesion. IMPRESSION: 1. Gallbladder distension without of evidence gallbladder inflammation. No radiodense gallstones. Consider ultrasound if concern  for cholecystitis. 2. Small free fluid along the RIGHT hepatic lobe. 3. Extensive sigmoid diverticulosis without evidence diverticulitis. 4.  Aortic Atherosclerosis (ICD10-I70.0). Electronically Signed   By: Suzy Bouchard M.D.   On: 09/23/2017 15:33   US Abdomen Limited  Result Date: 09/23/2017 CLINICAL DATA:  Right upper quadrant pain for several days EXAM: ULTRASOUND ABDOMEN LIMITED RIGHT UPPER QUADRANT COMPARISON:  CT from earlier in the same day. FINDINGS: Gallbladder: Well distended with gallstones and sludge. No pericholecystic fluid or gallbladder wall thickening is noted. Negative sonographic Percell Miller sign is noted. Common bile duct: Diameter: 5.2 mm. Liver: No focal lesion identified. Within normal limits in parenchymal echogenicity. Portal vein is patent on color Doppler imaging with normal direction of blood flow towards the liver. IMPRESSION: Cholelithiasis without definitive complicating factors. This may still be the etiology of the patient's discomfort.  Electronically Signed   By: Inez Catalina M.D.   On: 09/23/2017 16:49   Dg Chest Portable 1 View  Result Date: 09/23/2017 CLINICAL DATA:  Shortness of breath and pain EXAM: PORTABLE CHEST 1 VIEW COMPARISON:  June 27, 2017 FINDINGS: There is no edema or consolidation. Heart size and pulmonary vascularity are normal. No adenopathy. No pneumothorax. There is aortic atherosclerosis. IMPRESSION: Aortic atherosclerosis.  No edema or consolidation. Aortic Atherosclerosis (ICD10-I70.0). Electronically Signed   By: Lowella Grip III M.D.   On: 09/23/2017 11:23     IMPRESSION:   *   Symptomatic cholelithiasis.  Although the LFTs are elevated neither ultrasound or CT reveal any abnormal ductal pathology.  MRCP is ordered,  *  Chronic normocytic anemia.    *  New thrombocytopenia.     *  Stage 4 CKD  *  CAD.  Cardiac stants, last in 2007.  Chronic Plavix on hold, last dose was 3/31.     PLAN:     *  Await  MRCP,  hopefully can be done in pt s/p remote left THR and medical hardware in lumbar spine.    Repeat LFTs in AM.   .    Azucena Freed  09/24/2017, 10:31 AM Pager: [phone]    Attending physician's note   I have taken a history, examined the patient and reviewed the chart. I agree with the Advanced Practitioner's note, impression and recommendations.  Cholelithiasis and cholecystitis. Elevated LFTs. R/O choledocholithiasis. Although LFTs & t bili are elevated the bilary tree is not dilated.  Await MRCP, if THR is MRI compatible. If CBD is clear on MRCP then cholecystectomy with IOC. If MRCP shows choledocholithiasis then ERCP. If MRCP cannot be performed then either ERCP or cholecystectomy with IOC would be reasonable. If MRCP cannot be performed will confer with the surgical team however I favor cholecystectomy with IOC given lack of biliary dilation.   Lucio Edward, MD FACG [phone] office

## 2017-09-24 NOTE — Progress Notes (Signed)
  Echocardiogram 2D Echocardiogram has been performed.  Jannett Celestine 09/24/2017, 1:28 PM

## 2017-09-24 NOTE — Progress Notes (Signed)
Patient ID: Terry Garner, male   DOB: 11-25-1940, 77 y.o.   MRN: [id number]  PROGRESS NOTE    Terry Garner  PPI:[id number] DOB: [birth year] DOA: 09/23/2017 PCP: Lucky Cowboy, MD   Outpatient Specialists: [facility]  Brief Narrative: Terry Garner is a 77 y.o. male with medical history significant of AAA, chronic kidney disease, coronary artery disease comes in with about 2 days of worsening epigastric pain and right upper quadrant abdominal pain.  Patient has been vomiting and very nauseous with it.  He denies any fevers.  He denies any diarrhea.  He denies any association with food.  He still has his gallbladder.  Patient's feeling better after some IV morphine given in the ED.  Patient referred for admission for possible acute cholecystitis. Has been evaluated for surgery and needs medical clearance.  Assessment & Plan:   Principal Problem:   Choledocholithiasis with acute cholecystitis Active Problems:   Carotid artery disease (HCC)   Right bundle branch block   Essential hypertension   Abdominal aortic aneurysm (HCC)   CKD (chronic kidney disease) stage 4, GFR 15-29 ml/min (HCC)   Abdominal pain   #1. Cholidocholithiasis with Acute Cholecystitis: Patient will be placed on empiric antibiotics. Holding Plavix and ASA. Surgery consulted and we will consult GI. Will need cardiology clearance. Patient had previous Hip replacements and back Surgery. May not be a candidate for MRCP.  #2. AAA: stable.  #3 HTN: Controlled  #4 Carotid Artery disease: Stable. No symptoms now  #5 RBBB: Will need 2D echo. Monitor with telemetry.  #6 Hypokalemia: repleted  #7 CKD stage IV: Stable BUN/Creatinine  #8 Hypomagnesemia: Will replete magnesium. 1.6 yesterday   DVT prophylaxis: SCDs  Code Status:  Full  Family Communication: None   Disposition Plan: Home  Consultants:   Dr Dwain Sarna, General Surgery  [facility], Dr Ramon Dredge  Procedures:  CT abdomen  Antimicrobials:    -Meropenem  Subjective: Patient still has 5/10 pain in the right UQ. No NV now.  Objective: Vitals:   09/23/17 2259 09/24/17 0412 09/24/17 0755 09/24/17 0800  BP: (!) 168/62 (!) 160/63 (!) 123/58 (!) 128/52  Pulse: 72 66 60   Resp: 16 18 17    Temp: 98.7 F (37.1 C) 99 F (37.2 C) 98.2 F (36.8 C)   TempSrc: Oral Oral Oral   SpO2:  98% 97%   Weight: 79.2 kg (174 lb 9.7 oz)     Height: 6\' 3"  (1.905 m)       Intake/Output Summary (Last 24 hours) at 09/24/2017 0950 Last data filed at 09/24/2017 0400 Gross per 24 hour  Intake 0 ml  Output 500 ml  Net -500 ml   Filed Weights   09/23/17 1049 09/23/17 2259  Weight: 77.1 kg (170 lb) 79.2 kg (174 lb 9.7 oz)    Examination:  General exam: Appears calm and comfortable  Respiratory system: Clear to auscultation. Respiratory effort normal. Cardiovascular system: S1 & S2 heard, RRR. No JVD, murmurs, rubs, gallops or clicks. No pedal edema. Gastrointestinal system: Abdomen is nondistended, soft mildly tender. No organomegaly or masses felt. Normal bowel sounds heard. Central nervous system: Alert and oriented. No focal neurological deficits. Extremities: Symmetric 5 x 5 power. Skin: No rashes, lesions or ulcers Psychiatry: Judgement and insight appear normal. Mood & affect appropriate.     Data Reviewed: I have personally reviewed following labs and imaging studies  CBC: Recent Labs  Lab 09/23/17 1048 09/24/17 0343  WBC 9.7 13.2*  HGB 10.7* 9.9*  HCT 31.6* 30.4*  MCV 92.1 91.3  PLT 199 135*   Basic Metabolic Panel: Recent Labs  Lab 09/23/17 1048 09/23/17 1918 09/24/17 0343  NA 140  --  138  K 3.1*  --  4.2  CL 103  --  101  CO2 26  --  24  GLUCOSE 145*  --  108*  BUN 27*  --  28*  CREATININE 2.59*  --  2.41*  CALCIUM 9.3  --  9.0  MG  --  1.6*  --    GFR: Estimated Creatinine Clearance: 28.3 mL/min (A) (by C-G formula based on SCr of 2.41 mg/dL (H)). Liver Function Tests: Recent Labs  Lab 09/23/17 1130  09/24/17 0343  AST 163* 155*  ALT 53 70*  ALKPHOS 66 61  BILITOT 3.4* 4.3*  PROT 5.9* 5.1*  ALBUMIN 3.5 2.7*   Recent Labs  Lab 09/23/17 1130 09/24/17 0343  LIPASE 28 17   No results for input(s): AMMONIA in the last 168 hours. Coagulation Profile: Recent Labs  Lab 09/23/17 1048  INR 1.17   Cardiac Enzymes: No results for input(s): CKTOTAL, CKMB, CKMBINDEX, TROPONINI in the last 168 hours. BNP (last 3 results) No results for input(s): PROBNP in the last 8760 hours. HbA1C: No results for input(s): HGBA1C in the last 72 hours. CBG: No results for input(s): GLUCAP in the last 168 hours. Lipid Profile: No results for input(s): CHOL, HDL, LDLCALC, TRIG, CHOLHDL, LDLDIRECT in the last 72 hours. Thyroid Function Tests: No results for input(s): TSH, T4TOTAL, FREET4, T3FREE, THYROIDAB in the last 72 hours. Anemia Panel: No results for input(s): VITAMINB12, FOLATE, FERRITIN, TIBC, IRON, RETICCTPCT in the last 72 hours. Urine analysis:    Component Value Date/Time   COLORURINE YELLOW 12/20/2016 0924   APPEARANCEUR CLEAR 12/20/2016 0924   LABSPEC 1.013 12/20/2016 0924   PHURINE 7.0 12/20/2016 0924   GLUCOSEU NEGATIVE 12/20/2016 0924   HGBUR NEGATIVE 12/20/2016 0924   BILIRUBINUR NEGATIVE 12/20/2016 0924   KETONESUR NEGATIVE 12/20/2016 0924   PROTEINUR NEGATIVE 12/20/2016 0924   UROBILINOGEN 0.2 11/15/2014 2052   NITRITE NEGATIVE 12/20/2016 0924   LEUKOCYTESUR NEGATIVE 12/20/2016 0924   Sepsis Labs: @LABRCNTIP (procalcitonin:4,lacticidven:4)  )No results found for this or any previous visit (from the past 240 hour(s)).       Radiology Studies: Ct Abdomen Pelvis Wo Contrast  Result Date: 09/23/2017 CLINICAL DATA:  Abdominal pain.  Increasing abdominal pain EXAM: CT ABDOMEN AND PELVIS WITHOUT CONTRAST TECHNIQUE: Multidetector CT imaging of the abdomen and pelvis was performed following the standard protocol without IV contrast. COMPARISON:  None. FINDINGS: Lower chest:  Mild bibasilar atelectasis. Hepatobiliary: No focal hepatic lesion on noncontrast exam. Gallbladder distended to 5.5 cm. No radiodense gallstones are present. Common bile duct appears normal caliber. Small amount of free fluid along the RIGHT hepatic margin Pancreas: Pancreas is normal. No ductal dilatation. No pancreatic inflammation. Spleen: Normal spleen Adrenals/urinary tract: Adrenal glands normal. No nephrolithiasis or ureterolithiasis. Vascular calcification in the RIGHT renal hilum. No bladder calculi Stomach/Bowel: Stomach, small bowel, appendix, and cecum are normal. Multiple sigmoid diverticula. No acute diverticulitis. Vascular/Lymphatic: Abdominal aorta is normal caliber with atherosclerotic calcification. There is no retroperitoneal or periportal lymphadenopathy. No pelvic lymphadenopathy. Reproductive: Prostate normal Other: No free fluid. Musculoskeletal: No aggressive osseous lesion. IMPRESSION: 1. Gallbladder distension without of evidence gallbladder inflammation. No radiodense gallstones. Consider ultrasound if concern for cholecystitis. 2. Small free fluid along the RIGHT hepatic lobe. 3. Extensive sigmoid diverticulosis without evidence diverticulitis. 4.  Aortic Atherosclerosis (ICD10-I70.0). Electronically  Signed   By: Genevive Bi M.D.   On: 09/23/2017 15:33   US Abdomen Limited  Result Date: 09/23/2017 CLINICAL DATA:  Right upper quadrant pain for several days EXAM: ULTRASOUND ABDOMEN LIMITED RIGHT UPPER QUADRANT COMPARISON:  CT from earlier in the same day. FINDINGS: Gallbladder: Well distended with gallstones and sludge. No pericholecystic fluid or gallbladder wall thickening is noted. Negative sonographic Eulah Pont sign is noted. Common bile duct: Diameter: 5.2 mm. Liver: No focal lesion identified. Within normal limits in parenchymal echogenicity. Portal vein is patent on color Doppler imaging with normal direction of blood flow towards the liver. IMPRESSION: Cholelithiasis without  definitive complicating factors. This may still be the etiology of the patient's discomfort. Electronically Signed   By: Alcide Clever M.D.   On: 09/23/2017 16:49   Dg Chest Portable 1 View  Result Date: 09/23/2017 CLINICAL DATA:  Shortness of breath and pain EXAM: PORTABLE CHEST 1 VIEW COMPARISON:  June 27, 2017 FINDINGS: There is no edema or consolidation. Heart size and pulmonary vascularity are normal. No adenopathy. No pneumothorax. There is aortic atherosclerosis. IMPRESSION: Aortic atherosclerosis.  No edema or consolidation. Aortic Atherosclerosis (ICD10-I70.0). Electronically Signed   By: Bretta Bang III M.D.   On: 09/23/2017 11:23        Scheduled Meds: . allopurinol  300 mg Oral Daily  . amLODipine  5 mg Oral Daily  . aspirin EC  81 mg Oral Daily  . brimonidine  1 drop Both Eyes BID  . chlorhexidine  15 mL Mouth Rinse BID  . cholecalciferol  5,000 Units Oral Daily  . dorzolamide-timolol  1 drop Both Eyes BID  . loteprednol  1 drop Right Eye BID  . mouth rinse  15 mL Mouth Rinse q12n4p  . nepafenac  1 drop Right Eye Daily   Continuous Infusions:   LOS: 0 days    Time spent: 33 minutes    Orlean Holtrop,LAWAL, MD [facility] Pager [phone]  If 7PM-7AM, please contact night-coverage [email or web address] Password [email or web address] 09/24/2017, 9:50 AM

## 2017-09-24 NOTE — Progress Notes (Signed)
Subjective/Chief Complaint: Some ruq soreness otherwise ok   Objective: Vital signs in last 24 hours: Temp:  [97.9 F (36.6 C)-99 F (37.2 C)] 98.2 F (36.8 C) (04/02 0755) Pulse Rate:  [60-85] 60 (04/02 0755) Resp:  [14-27] 17 (04/02 0755) BP: (123-172)/(52-79) 128/52 (04/02 0800) SpO2:  [95 %-100 %] 97 % (04/02 0755) Weight:  [77.1 kg (170 lb)-79.2 kg (174 lb 9.7 oz)] 79.2 kg (174 lb 9.7 oz) (04/01 2259) Last BM Date: 09/23/17  Intake/Output from previous day: 04/01 0701 - 04/02 0700 In: 0  Out: 500 [Urine:500] Intake/Output this shift: No intake/output data recorded.  GI: nontender nondistended  Lab Results:  Recent Labs    09/23/17 1048 09/24/17 0343  WBC 9.7 13.2*  HGB 10.7* 9.9*  HCT 31.6* 30.4*  PLT 199 135*   BMET Recent Labs    09/23/17 1048 09/24/17 0343  NA 140 138  K 3.1* 4.2  CL 103 101  CO2 26 24  GLUCOSE 145* 108*  BUN 27* 28*  CREATININE 2.59* 2.41*  CALCIUM 9.3 9.0   PT/INR Recent Labs    09/23/17 1048  LABPROT 14.8  INR 1.17   ABG No results for input(s): PHART, HCO3 in the last 72 hours.  Invalid input(s): PCO2, PO2  Studies/Results: Ct Abdomen Pelvis Wo Contrast  Result Date: 09/23/2017 CLINICAL DATA:  Abdominal pain.  Increasing abdominal pain EXAM: CT ABDOMEN AND PELVIS WITHOUT CONTRAST TECHNIQUE: Multidetector CT imaging of the abdomen and pelvis was performed following the standard protocol without IV contrast. COMPARISON:  None. FINDINGS: Lower chest: Mild bibasilar atelectasis. Hepatobiliary: No focal hepatic lesion on noncontrast exam. Gallbladder distended to 5.5 cm. No radiodense gallstones are present. Common bile duct appears normal caliber. Small amount of free fluid along the RIGHT hepatic margin Pancreas: Pancreas is normal. No ductal dilatation. No pancreatic inflammation. Spleen: Normal spleen Adrenals/urinary tract: Adrenal glands normal. No nephrolithiasis or ureterolithiasis. Vascular calcification in the  RIGHT renal hilum. No bladder calculi Stomach/Bowel: Stomach, small bowel, appendix, and cecum are normal. Multiple sigmoid diverticula. No acute diverticulitis. Vascular/Lymphatic: Abdominal aorta is normal caliber with atherosclerotic calcification. There is no retroperitoneal or periportal lymphadenopathy. No pelvic lymphadenopathy. Reproductive: Prostate normal Other: No free fluid. Musculoskeletal: No aggressive osseous lesion. IMPRESSION: 1. Gallbladder distension without of evidence gallbladder inflammation. No radiodense gallstones. Consider ultrasound if concern for cholecystitis. 2. Small free fluid along the RIGHT hepatic lobe. 3. Extensive sigmoid diverticulosis without evidence diverticulitis. 4.  Aortic Atherosclerosis (ICD10-I70.0). Electronically Signed   By: Suzy Bouchard M.D.   On: 09/23/2017 15:33   US Abdomen Limited  Result Date: 09/23/2017 CLINICAL DATA:  Right upper quadrant pain for several days EXAM: ULTRASOUND ABDOMEN LIMITED RIGHT UPPER QUADRANT COMPARISON:  CT from earlier in the same day. FINDINGS: Gallbladder: Well distended with gallstones and sludge. No pericholecystic fluid or gallbladder wall thickening is noted. Negative sonographic Percell Miller sign is noted. Common bile duct: Diameter: 5.2 mm. Liver: No focal lesion identified. Within normal limits in parenchymal echogenicity. Portal vein is patent on color Doppler imaging with normal direction of blood flow towards the liver. IMPRESSION: Cholelithiasis without definitive complicating factors. This may still be the etiology of the patient's discomfort. Electronically Signed   By: Inez Catalina M.D.   On: 09/23/2017 16:49   Dg Chest Portable 1 View  Result Date: 09/23/2017 CLINICAL DATA:  Shortness of breath and pain EXAM: PORTABLE CHEST 1 VIEW COMPARISON:  June 27, 2017 FINDINGS: There is no edema or consolidation. Heart size and pulmonary vascularity  are normal. No adenopathy. No pneumothorax. There is aortic  atherosclerosis. IMPRESSION: Aortic atherosclerosis.  No edema or consolidation. Aortic Atherosclerosis (ICD10-I70.0). Electronically Signed   By: Lowella Grip III M.D.   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

## 2017-09-25 ENCOUNTER — Inpatient Hospital Stay (HOSPITAL_COMMUNITY): Payer: Medicare Other

## 2017-09-25 DIAGNOSIS — Z01810 Encounter for preprocedural cardiovascular examination: Secondary | ICD-10-CM

## 2017-09-25 DIAGNOSIS — I4510 Unspecified right bundle-branch block: Secondary | ICD-10-CM

## 2017-09-25 LAB — SURGICAL PCR SCREEN
MRSA, PCR: NEGATIVE
STAPHYLOCOCCUS AUREUS: NEGATIVE

## 2017-09-25 LAB — COMPREHENSIVE METABOLIC PANEL
ALT: 63 U/L (ref 17–63)
ANION GAP: 12 (ref 5–15)
AST: 116 U/L — ABNORMAL HIGH (ref 15–41)
Albumin: 2.3 g/dL — ABNORMAL LOW (ref 3.5–5.0)
Alkaline Phosphatase: 67 U/L (ref 38–126)
BUN: 37 mg/dL — ABNORMAL HIGH (ref 6–20)
CALCIUM: 8.6 mg/dL — AB (ref 8.9–10.3)
CO2: 23 mmol/L (ref 22–32)
Chloride: 105 mmol/L (ref 101–111)
Creatinine, Ser: 2.61 mg/dL — ABNORMAL HIGH (ref 0.61–1.24)
GFR calc non Af Amer: 22 mL/min — ABNORMAL LOW (ref >60)
GFR, EST AFRICAN AMERICAN: 25 mL/min — AB (ref >60)
Glucose, Bld: 90 mg/dL (ref 65–99)
Potassium: 4 mmol/L (ref 3.5–5.1)
SODIUM: 140 mmol/L (ref 135–145)
Total Bilirubin: 4.3 mg/dL — ABNORMAL HIGH (ref 0.3–1.2)
Total Protein: 4.4 g/dL — ABNORMAL LOW (ref 6.5–8.1)

## 2017-09-25 NOTE — Progress Notes (Signed)
Patient ID: Terry Garner, male   DOB: 1940-10-14, 77 y.o.   MRN: [id number]  PROGRESS NOTE    Terry Garner  ZHY:[id number] DOB: Apr 15, 1941 DOA: 09/23/2017 PCP: Lucky Cowboy, MD   Outpatient Specialists: [facility]  Brief Narrative: Terry Garner is a 77 y.o. male with medical history significant of AAA, chronic kidney disease, coronary artery disease comes in with about 2 days of worsening epigastric pain and right upper quadrant abdominal pain.  Patient has been vomiting and very nauseous with it.  He denies any fevers.  He denies any diarrhea.  He denies any association with food.  He still has his gallbladder.  Patient's feeling better after some IV morphine given in the ED.  Patient referred for admission for possible acute cholecystitis. Has been evaluated for surgery and needs medical clearance.  Assessment & Plan:   Principal Problem:   Choledocholithiasis with acute cholecystitis Active Problems:   Carotid artery disease (HCC)   Right bundle branch block   Essential hypertension   Abdominal aortic aneurysm (HCC)   CKD (chronic kidney disease) stage 4, GFR 15-29 ml/min (HCC)   Abdominal pain   #1. Cholidocholithiasis with Acute Cholecystitis: Patient on empiric antibiotics. Holding Plavix and ASA. Surgery and GI consulted and cardiology has cleared patient for surgery. Plan for possible cholecystectomy tomorrow. MRCP showed no dilated ducts so ERCP not going to be done.   #2. AAA: stable.  #3 HTN: Controlled  #4 Carotid Artery disease: Stable. No symptoms now  #5 RBBB: Will need 2D echo. Monitor with telemetry.  #6 Hypokalemia: repleted  #7 CKD stage IV: Stable BUN/Creatinine  #8 Hypomagnesemia: magnesium repleted. Recheck level  DVT prophylaxis: SCDs  Code Status:  Full  Family Communication: None   Disposition Plan: Home  Consultants:   Dr Dwain Sarna, General Surgery  [facility], Dr Ramon Dredge  Cardiology, Dr Rennis Golden  Procedures:  CT  abdomen MRCP  Antimicrobials:  -Meropenem  Subjective: Patient still has 3/10 pain in the right UQ.   Objective: Vitals:   09/24/17 0800 09/24/17 1322 09/24/17 2154 09/25/17 0411  BP: (!) 128/52 (!) 134/52 (!) 134/53 (!) 148/61  Pulse:  (!) 106 64 62  Resp:  18 19 18   Temp:  97.8 F (36.6 C) 99.3 F (37.4 C) 98.6 F (37 C)  TempSrc:  Oral Oral Oral  SpO2:  96% 97% 93%  Weight:      Height:        Intake/Output Summary (Last 24 hours) at 09/25/2017 0929 Last data filed at 09/25/2017 0645 Gross per 24 hour  Intake 1046.25 ml  Output 650 ml  Net 396.25 ml   Filed Weights   09/23/17 1049 09/23/17 2259  Weight: 77.1 kg (170 lb) 79.2 kg (174 lb 9.7 oz)    Examination:  General exam: Appears calm and comfortable  Respiratory system: Clear to auscultation. Respiratory effort normal. Cardiovascular system: S1 & S2 heard, RRR. No JVD, murmurs, rubs, gallops or clicks. No pedal edema. Gastrointestinal system: Abdomen is nondistended, soft mildly tender. No organomegaly or masses felt. Normal bowel sounds heard. Central nervous system: Alert and oriented. No focal neurological deficits. Extremities: Symmetric 5 x 5 power. Skin: No rashes, lesions or ulcers Psychiatry: Judgement and insight appear normal. Mood & affect appropriate.     Data Reviewed: I have personally reviewed following labs and imaging studies  CBC: Recent Labs  Lab 09/23/17 1048 09/24/17 0343  WBC 9.7 13.2*  HGB 10.7* 9.9*  HCT 31.6* 30.4*  MCV 92.1  91.3  PLT 199 135*   Basic Metabolic Panel: Recent Labs  Lab 09/23/17 1048 09/23/17 1918 09/24/17 0343 09/25/17 0403  NA 140  --  138 140  K 3.1*  --  4.2 4.0  CL 103  --  101 105  CO2 26  --  24 23  GLUCOSE 145*  --  108* 90  BUN 27*  --  28* 37*  CREATININE 2.59*  --  2.41* 2.61*  CALCIUM 9.3  --  9.0 8.6*  MG  --  1.6*  --   --    GFR: Estimated Creatinine Clearance: 26.1 mL/min (A) (by C-G formula based on SCr of 2.61 mg/dL  (H)). Liver Function Tests: Recent Labs  Lab 09/23/17 1130 09/24/17 0343 09/25/17 0403  AST 163* 155* 116*  ALT 53 70* 63  ALKPHOS 66 61 67  BILITOT 3.4* 4.3* 4.3*  PROT 5.9* 5.1* 4.4*  ALBUMIN 3.5 2.7* 2.3*   Recent Labs  Lab 09/23/17 1130 09/24/17 0343  LIPASE 28 17   No results for input(s): AMMONIA in the last 168 hours. Coagulation Profile: Recent Labs  Lab 09/23/17 1048  INR 1.17   Cardiac Enzymes: No results for input(s): CKTOTAL, CKMB, CKMBINDEX, TROPONINI in the last 168 hours. BNP (last 3 results) No results for input(s): PROBNP in the last 8760 hours. HbA1C: No results for input(s): HGBA1C in the last 72 hours. CBG: No results for input(s): GLUCAP in the last 168 hours. Lipid Profile: No results for input(s): CHOL, HDL, LDLCALC, TRIG, CHOLHDL, LDLDIRECT in the last 72 hours. Thyroid Function Tests: No results for input(s): TSH, T4TOTAL, FREET4, T3FREE, THYROIDAB in the last 72 hours. Anemia Panel: No results for input(s): VITAMINB12, FOLATE, FERRITIN, TIBC, IRON, RETICCTPCT in the last 72 hours. Urine analysis:    Component Value Date/Time   COLORURINE YELLOW 12/20/2016 0924   APPEARANCEUR CLEAR 12/20/2016 0924   LABSPEC 1.013 12/20/2016 0924   PHURINE 7.0 12/20/2016 0924   GLUCOSEU NEGATIVE 12/20/2016 0924   HGBUR NEGATIVE 12/20/2016 0924   BILIRUBINUR NEGATIVE 12/20/2016 0924   KETONESUR NEGATIVE 12/20/2016 0924   PROTEINUR NEGATIVE 12/20/2016 0924   UROBILINOGEN 0.2 11/15/2014 2052   NITRITE NEGATIVE 12/20/2016 0924   LEUKOCYTESUR NEGATIVE 12/20/2016 0924   Sepsis Labs: @LABRCNTIP (procalcitonin:4,lacticidven:4)  )No results found for this or any previous visit (from the past 240 hour(s)).       Radiology Studies: Ct Abdomen Pelvis Wo Contrast  Result Date: 09/23/2017 CLINICAL DATA:  Abdominal pain.  Increasing abdominal pain EXAM: CT ABDOMEN AND PELVIS WITHOUT CONTRAST TECHNIQUE: Multidetector CT imaging of the abdomen and pelvis was  performed following the standard protocol without IV contrast. COMPARISON:  None. FINDINGS: Lower chest: Mild bibasilar atelectasis. Hepatobiliary: No focal hepatic lesion on noncontrast exam. Gallbladder distended to 5.5 cm. No radiodense gallstones are present. Common bile duct appears normal caliber. Small amount of free fluid along the RIGHT hepatic margin Pancreas: Pancreas is normal. No ductal dilatation. No pancreatic inflammation. Spleen: Normal spleen Adrenals/urinary tract: Adrenal glands normal. No nephrolithiasis or ureterolithiasis. Vascular calcification in the RIGHT renal hilum. No bladder calculi Stomach/Bowel: Stomach, small bowel, appendix, and cecum are normal. Multiple sigmoid diverticula. No acute diverticulitis. Vascular/Lymphatic: Abdominal aorta is normal caliber with atherosclerotic calcification. There is no retroperitoneal or periportal lymphadenopathy. No pelvic lymphadenopathy. Reproductive: Prostate normal Other: No free fluid. Musculoskeletal: No aggressive osseous lesion. IMPRESSION: 1. Gallbladder distension without of evidence gallbladder inflammation. No radiodense gallstones. Consider ultrasound if concern for cholecystitis. 2. Small free fluid along the RIGHT  hepatic lobe. 3. Extensive sigmoid diverticulosis without evidence diverticulitis. 4.  Aortic Atherosclerosis (ICD10-I70.0). Electronically Signed   By: Genevive Bi M.D.   On: 09/23/2017 15:33   Mr Abdomen Mrcp Wo Contrast  Result Date: 09/24/2017 CLINICAL DATA:  Inpatient. Epigastric/right upper quadrant abdominal pain. Abnormal liver function tests. Cholelithiasis. EXAM: MRI ABDOMEN WITHOUT CONTRAST  (INCLUDING MRCP) TECHNIQUE: Multiplanar multisequence MR imaging of the abdomen was performed. Heavily T2-weighted images of the biliary and pancreatic ducts were obtained, and three-dimensional MRCP images were rendered by post processing. COMPARISON:  09/23/2017 unenhanced CT abdomen/pelvis and right upper quadrant  abdominal sonogram. FINDINGS: Lower chest: Hypoventilatory changes in the dependent lung bases bilaterally. Hepatobiliary: Normal liver size and configuration. No hepatic steatosis. There is a sub 5 mm T2 hyperintense posterior right liver lobe lesion, incompletely characterized on this noncontrast scan, for which no follow-up is required unless the patient has risk factors for liver malignancy. No additional liver lesions. Distended gallbladder (5.3 cm diameter). Layering sludge and tiny 2-3 mm gallstones in the gallbladder. Borderline mild diffuse gallbladder wall thickening (gallbladder wall thickness 3 mm). No pericholecystic fluid. No biliary ductal dilatation. Common bile duct diameter 6 mm. Small amount of layering sludge in the common bile duct, with no filling defects in the common bile duct to suggest choledocholithiasis. Small periampullary duodenal diverticulum. Pancreas: There is a 0.8 cm cystic pancreatic body lesion (series 13001/image 52) without appreciable wall thickening or internal complexity. No additional pancreatic lesions. No pancreatic duct dilation. No convincing pancreas divisum (the main pancreatic duct is drained by an accessory duct of Santorini, however also appears to communicate with the common bile duct). Spleen: Normal size. No mass. Adrenals/Urinary Tract: No adrenal nodules. No hydronephrosis. Normal kidneys with no renal mass. Stomach/Bowel: Grossly normal stomach. Visualized small and large bowel is normal caliber, with no bowel wall thickening. Vascular/Lymphatic: Infrarenal 4.7 cm abdominal aortic aneurysm, stable. No pathologically enlarged lymph nodes in the abdomen. Other: Trace perihepatic ascites.  No focal fluid collection. Musculoskeletal: No aggressive appearing focal osseous lesions. Bilateral posterior spinal fusion hardware is visualized in the lower lumbar spine. IMPRESSION: 1. Layering sludge and tiny gallstones in the distended gallbladder with borderline mild  diffuse gallbladder wall thickening. No pericholecystic fluid. These findings are nonspecific and could be compatible with acute cholecystitis in the correct clinical setting. 2. No biliary ductal dilatation. CBD diameter 6 mm. Small amount of layering sludge in the CBD, with no evidence of choledocholithiasis. 3. 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 Follow-up MRI abdomen without and with IV contrast recommended in 2 years. This recommendation follows ACR consensus guidelines: Management of Incidental Pancreatic Cysts: A White Paper of the ACR Incidental Findings Committee. J Am Coll Radiol 2017;14:911-923. 4. Infrarenal 4.7 cm abdominal aortic aneurysm. Recommend followup by abdomen and pelvis CTA in 6 months, and vascular surgery referral/consultation if not already obtained. This recommendation follows ACR consensus guidelines: White Paper of the ACR Incidental Findings Committee II on Vascular Findings. J Am Coll Radiol 2013; 10:789-794. 5. Trace perihepatic ascites. Electronically Signed   By: Delbert Phenix M.D.   On: 09/24/2017 18:09   Mr 3d Recon At Scanner  Result Date: 09/24/2017 CLINICAL DATA:  Inpatient. Epigastric/right upper quadrant abdominal pain. Abnormal liver function tests. Cholelithiasis. EXAM: MRI ABDOMEN WITHOUT CONTRAST  (INCLUDING MRCP) TECHNIQUE: Multiplanar multisequence MR imaging of the abdomen was performed. Heavily T2-weighted images of the biliary and pancreatic ducts were obtained, and three-dimensional MRCP images were rendered by post processing.  COMPARISON:  09/23/2017 unenhanced CT abdomen/pelvis and right upper quadrant abdominal sonogram. FINDINGS: Lower chest: Hypoventilatory changes in the dependent lung bases bilaterally. Hepatobiliary: Normal liver size and configuration. No hepatic steatosis. There is a sub 5 mm T2 hyperintense posterior right liver lobe lesion, incompletely characterized on this noncontrast scan, for  which no follow-up is required unless the patient has risk factors for liver malignancy. No additional liver lesions. Distended gallbladder (5.3 cm diameter). Layering sludge and tiny 2-3 mm gallstones in the gallbladder. Borderline mild diffuse gallbladder wall thickening (gallbladder wall thickness 3 mm). No pericholecystic fluid. No biliary ductal dilatation. Common bile duct diameter 6 mm. Small amount of layering sludge in the common bile duct, with no filling defects in the common bile duct to suggest choledocholithiasis. Small periampullary duodenal diverticulum. Pancreas: There is a 0.8 cm cystic pancreatic body lesion (series 13001/image 52) without appreciable wall thickening or internal complexity. No additional pancreatic lesions. No pancreatic duct dilation. No convincing pancreas divisum (the main pancreatic duct is drained by an accessory duct of Santorini, however also appears to communicate with the common bile duct). Spleen: Normal size. No mass. Adrenals/Urinary Tract: No adrenal nodules. No hydronephrosis. Normal kidneys with no renal mass. Stomach/Bowel: Grossly normal stomach. Visualized small and large bowel is normal caliber, with no bowel wall thickening. Vascular/Lymphatic: Infrarenal 4.7 cm abdominal aortic aneurysm, stable. No pathologically enlarged lymph nodes in the abdomen. Other: Trace perihepatic ascites.  No focal fluid collection. Musculoskeletal: No aggressive appearing focal osseous lesions. Bilateral posterior spinal fusion hardware is visualized in the lower lumbar spine. IMPRESSION: 1. Layering sludge and tiny gallstones in the distended gallbladder with borderline mild diffuse gallbladder wall thickening. No pericholecystic fluid. These findings are nonspecific and could be compatible with acute cholecystitis in the correct clinical setting. 2. No biliary ductal dilatation. CBD diameter 6 mm. Small amount of layering sludge in the CBD, with no evidence of  choledocholithiasis. 3. 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 Follow-up MRI abdomen without and with IV contrast recommended in 2 years. This recommendation follows ACR consensus guidelines: Management of Incidental Pancreatic Cysts: A White Paper of the ACR Incidental Findings Committee. J Am Coll Radiol 2017;14:911-923. 4. Infrarenal 4.7 cm abdominal aortic aneurysm. Recommend followup by abdomen and pelvis CTA in 6 months, and vascular surgery referral/consultation if not already obtained. This recommendation follows ACR consensus guidelines: White Paper of the ACR Incidental Findings Committee II on Vascular Findings. J Am Coll Radiol 2013; 10:789-794. 5. Trace perihepatic ascites. Electronically Signed   By: Delbert Phenix M.D.   On: 09/24/2017 18:09   US Abdomen Limited  Result Date: 09/23/2017 CLINICAL DATA:  Right upper quadrant pain for several days EXAM: ULTRASOUND ABDOMEN LIMITED RIGHT UPPER QUADRANT COMPARISON:  CT from earlier in the same day. FINDINGS: Gallbladder: Well distended with gallstones and sludge. No pericholecystic fluid or gallbladder wall thickening is noted. Negative sonographic Eulah Pont sign is noted. Common bile duct: Diameter: 5.2 mm. Liver: No focal lesion identified. Within normal limits in parenchymal echogenicity. Portal vein is patent on color Doppler imaging with normal direction of blood flow towards the liver. IMPRESSION: Cholelithiasis without definitive complicating factors. This may still be the etiology of the patient's discomfort. Electronically Signed   By: Alcide Clever M.D.   On: 09/23/2017 16:49   Dg Chest Portable 1 View  Result Date: 09/23/2017 CLINICAL DATA:  Shortness of breath and pain EXAM: PORTABLE CHEST 1 VIEW COMPARISON:  June 27, 2017 FINDINGS: There is  no edema or consolidation. Heart size and pulmonary vascularity are normal. No adenopathy. No pneumothorax. There is aortic atherosclerosis. IMPRESSION:  Aortic atherosclerosis.  No edema or consolidation. Aortic Atherosclerosis (ICD10-I70.0). Electronically Signed   By: Bretta Bang III M.D.   On: 09/23/2017 11:23        Scheduled Meds: . allopurinol  300 mg Oral Daily  . amLODipine  5 mg Oral Daily  . aspirin EC  81 mg Oral Daily  . brimonidine  1 drop Both Eyes BID  . chlorhexidine  15 mL Mouth Rinse BID  . cholecalciferol  5,000 Units Oral Daily  . dorzolamide-timolol  1 drop Both Eyes BID  . loteprednol  1 drop Right Eye BID  . mouth rinse  15 mL Mouth Rinse q12n4p  . nepafenac  1 drop Right Eye Daily   Continuous Infusions: . ampicillin-sulbactam (UNASYN) IV Stopped (09/24/17 2326)     LOS: 1 day    Time spent: 33 minutes    Jakorian Marengo,LAWAL, MD [facility] Pager [phone]  If 7PM-7AM, please contact night-coverage [email or web address] Password [facility] 09/25/2017, 9:29 AM

## 2017-09-25 NOTE — Progress Notes (Signed)
Daily Rounding Note  09/25/2017, 8:14 AM  LOS: 1 day   SUBJECTIVE:   Chief complaint:     RUQ pain, improved but persists  OBJECTIVE:         Vital signs in last 24 hours:    Temp:  [97.8 F (36.6 C)-99.3 F (37.4 C)] 98.6 F (37 C) (04/03 0411) Pulse Rate:  [62-106] 62 (04/03 0411) Resp:  [18-19] 18 (04/03 0411) BP: (134-148)/(52-61) 148/61 (04/03 0411) SpO2:  [93 %-97 %] 93 % (04/03 0411) Last BM Date: 09/23/17 Filed Weights   09/23/17 1049 09/23/17 2259  Weight: 170 lb (77.1 kg) 174 lb 9.7 oz (79.2 kg)   General: [place] WD/WM, some discomfort   Heart: RRR Chest: CTA Abdomen: soft, ND, RUQ tenderness, no HSM, masses, +BS Extremities: no CCE Neuro/Psych:  Alert, oriented  Intake/Output from previous day: 04/02 0701 - 04/03 0700 In: 1046.3 [P.O.:240; I.V.:706.3; IV Piggyback:100] Out: 650 [Urine:650]  Intake/Output this shift: No intake/output data recorded.  Lab Results: Recent Labs    09/23/17 1048 09/24/17 0343  WBC 9.7 13.2*  HGB 10.7* 9.9*  HCT 31.6* 30.4*  PLT 199 135*   BMET Recent Labs    09/23/17 1048 09/24/17 0343 09/25/17 0403  NA 140 138 140  K 3.1* 4.2 4.0  CL 103 101 105  CO2 26 24 23   GLUCOSE 145* 108* 90  BUN 27* 28* 37*  CREATININE 2.59* 2.41* 2.61*  CALCIUM 9.3 9.0 8.6*   LFT Recent Labs    09/23/17 1130 09/24/17 0343 09/25/17 0403  PROT 5.9* 5.1* 4.4*  ALBUMIN 3.5 2.7* 2.3*  AST 163* 155* 116*  ALT 53 70* 63  ALKPHOS 66 61 67  BILITOT 3.4* 4.3* 4.3*  BILIDIR 2.2*  --   --   IBILI 1.2*  --   --    PT/INR Recent Labs    09/23/17 1048  LABPROT 14.8  INR 1.17   Hepatitis Panel No results for input(s): HEPBSAG, HCVAB, HEPAIGM, HEPBIGM in the last 72 hours.  Studies/Results: Ct Abdomen Pelvis Wo Contrast  Result Date: 09/23/2017 CLINICAL DATA:  Abdominal pain.  Increasing abdominal pain EXAM: CT ABDOMEN AND PELVIS WITHOUT CONTRAST TECHNIQUE:  Multidetector CT imaging of the abdomen and pelvis was performed following the standard protocol without IV contrast. COMPARISON:  None. FINDINGS: Lower chest: Mild bibasilar atelectasis. Hepatobiliary: No focal hepatic lesion on noncontrast exam. Gallbladder distended to 5.5 cm. No radiodense gallstones are present. Common bile duct appears normal caliber. Small amount of free fluid along the RIGHT hepatic margin Pancreas: Pancreas is normal. No ductal dilatation. No pancreatic inflammation. Spleen: Normal spleen Adrenals/urinary tract: Adrenal glands normal. No nephrolithiasis or ureterolithiasis. Vascular calcification in the RIGHT renal hilum. No bladder calculi Stomach/Bowel: Stomach, small bowel, appendix, and cecum are normal. Multiple sigmoid diverticula. No acute diverticulitis. Vascular/Lymphatic: Abdominal aorta is normal caliber with atherosclerotic calcification. There is no retroperitoneal or periportal lymphadenopathy. No pelvic lymphadenopathy. Reproductive: Prostate normal Other: No free fluid. Musculoskeletal: No aggressive osseous lesion. IMPRESSION: 1. Gallbladder distension without of evidence gallbladder inflammation. No radiodense gallstones. Consider ultrasound if concern for cholecystitis. 2. Small free fluid along the RIGHT hepatic lobe. 3. Extensive sigmoid diverticulosis without evidence diverticulitis. 4.  Aortic Atherosclerosis (ICD10-I70.0). Electronically Signed   By: Suzy Bouchard M.D.   On: 09/23/2017 15:33   Mr Abdomen Mrcp Wo Contrast Mr 3d Recon At Scanner  Result Date: 09/24/2017 CLINICAL DATA:  Inpatient. Epigastric/right upper quadrant abdominal pain. Abnormal liver  function tests. Cholelithiasis. EXAM: MRI ABDOMEN WITHOUT CONTRAST  (INCLUDING MRCP) TECHNIQUE: Multiplanar multisequence MR imaging of the abdomen was performed. Heavily T2-weighted images of the biliary and pancreatic ducts were obtained, and three-dimensional MRCP images were rendered by post processing.  COMPARISON:  09/23/2017 unenhanced CT abdomen/pelvis and right upper quadrant abdominal sonogram. FINDINGS: Lower chest: Hypoventilatory changes in the dependent lung bases bilaterally. Hepatobiliary: Normal liver size and configuration. No hepatic steatosis. There is a sub 5 mm T2 hyperintense posterior right liver lobe lesion, incompletely characterized on this noncontrast scan, for which no follow-up is required unless the patient has risk factors for liver malignancy. No additional liver lesions. Distended gallbladder (5.3 cm diameter). Layering sludge and tiny 2-3 mm gallstones in the gallbladder. Borderline mild diffuse gallbladder wall thickening (gallbladder wall thickness 3 mm). No pericholecystic fluid. No biliary ductal dilatation. Common bile duct diameter 6 mm. Small amount of layering sludge in the common bile duct, with no filling defects in the common bile duct to suggest choledocholithiasis. Small periampullary duodenal diverticulum. Pancreas: There is a 0.8 cm cystic pancreatic body lesion (series 13001/image 52) without appreciable wall thickening or internal complexity. No additional pancreatic lesions. No pancreatic duct dilation. No convincing pancreas divisum (the main pancreatic duct is drained by an accessory duct of [place]orini, however also appears to communicate with the common bile duct). Spleen: Normal size. No mass. Adrenals/Urinary Tract: No adrenal nodules. No hydronephrosis. Normal kidneys with no renal mass. Stomach/Bowel: Grossly normal stomach. Visualized small and large bowel is normal caliber, with no bowel wall thickening. Vascular/Lymphatic: Infrarenal 4.7 cm abdominal aortic aneurysm, stable. No pathologically enlarged lymph nodes in the abdomen. Other: Trace perihepatic ascites.  No focal fluid collection. Musculoskeletal: No aggressive appearing focal osseous lesions. Bilateral posterior spinal fusion hardware is visualized in the lower lumbar spine. IMPRESSION: 1. Layering  sludge and tiny gallstones in the distended gallbladder with borderline mild diffuse gallbladder wall thickening. No pericholecystic fluid. These findings are nonspecific and could be compatible with acute cholecystitis in the correct clinical setting. 2. No biliary ductal dilatation. CBD diameter 6 mm. Small amount of layering sludge in the CBD, with no evidence of choledocholithiasis. 3. 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 Follow-up MRI abdomen without and with IV contrast recommended in 2 years. This recommendation follows ACR consensus guidelines: Management of Incidental Pancreatic Cysts: A White Paper of the ACR Incidental Findings Committee. [place] 3016;01:093-235. 4. Infrarenal 4.7 cm abdominal aortic aneurysm. Recommend followup by abdomen and pelvis CTA in 6 months, and vascular surgery referral/consultation if not already obtained. This recommendation follows ACR consensus guidelines: White Paper of the ACR Incidental Findings Committee II on Vascular Findings. J Am Coll Radiol 2013; 10:789-794. 5. Trace perihepatic ascites. Electronically Signed   By: Ilona Sorrel M.D.   On: 09/24/2017 18:09   US Abdomen Limited  Result Date: 09/23/2017 CLINICAL DATA:  Right upper quadrant pain for several days EXAM: ULTRASOUND ABDOMEN LIMITED RIGHT UPPER QUADRANT COMPARISON:  CT from earlier in the same day. FINDINGS: Gallbladder: Well distended with gallstones and sludge. No pericholecystic fluid or gallbladder wall thickening is noted. Negative sonographic Percell Miller sign is noted. Common bile duct: Diameter: 5.2 mm. Liver: No focal lesion identified. Within normal limits in parenchymal echogenicity. Portal vein is patent on color Doppler imaging with normal direction of blood flow towards the liver. IMPRESSION: Cholelithiasis without definitive complicating factors. This may still be the etiology of the patient's discomfort. Electronically Signed   By:  Inez Catalina M.D.   On: 09/23/2017 16:49   Scheduled Meds: . allopurinol  300 mg Oral Daily  . amLODipine  5 mg Oral Daily  . aspirin EC  81 mg Oral Daily  . brimonidine  1 drop Both Eyes BID  . chlorhexidine  15 mL Mouth Rinse BID  . cholecalciferol  5,000 Units Oral Daily  . dorzolamide-timolol  1 drop Both Eyes BID  . loteprednol  1 drop Right Eye BID  . mouth rinse  15 mL Mouth Rinse q12n4p  . nepafenac  1 drop Right Eye Daily   Continuous Infusions: . ampicillin-sulbactam (UNASYN) IV Stopped (09/24/17 2326)   PRN Meds:.morphine injection, ondansetron **OR** ondansetron (ZOFRAN) IV    ASSESMENT:   *  Symptomatic cholelithiasis,  ?cholangitis.  Jaundice.  Day 2 Unasyn.   MRCP with GB sludge and mild GB wall thickening.  No ductal abnormalities or CBD stone.  Subcentimeter cystic lesion at pancreatic body without worrisome features.  T bili elevated, stable; transaminases trending down.  In setting of CKD, T bili may be slow to normalize.    *  Hx cardiac stents. Plavix on hold, last dose 3/31.    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  Colon/EGD for IDA in 11/2013.      *  Thrombocytopenia, CBC not repeated today.    *  Bil retinal detachment.  Legally blind.     PLAN   *  Await surgeon's plans.  No plans for ERCP.    *  Will need f/up MRI to follow pancreatic lesion 09/2019.   Terry Garner  09/25/2017, 8:14 AM Phone [phone]     Attending physician's note   I have taken an interval history, reviewed the chart and examined the patient. I agree with the Advanced Practitioner's note, impression and recommendations. MRCP shows GB sludge, mild GB wall thickening, sludge in CBD, no CBD stones or obstruction and a 0.8 cm pancreatic body cyst. LFTs trending down however t bili is lagging, which is not unusual. No plans for ERCP. Cholecystectomy with IOC is next. Repeat pancreatic MRI in 2 years. GI signing off however we are available if needed.   Lucio Edward,  MD FACG [phone] office

## 2017-09-25 NOTE — Progress Notes (Signed)
[facility] Progress Note     Subjective: CC- rib pain Patient states that his abdominal pain is about the same. Continues to complain of RUQ pain. Denies n/v. No BM. Main complaint is right sided rib pain; states that yesterday when he was being helped OOB he was dropped and his ribs have hurt ever since. Denies CP or SOB.  LFTs trending except bilirubin still elevated.  Objective: Vital signs in last 24 hours: Temp:  [97.8 F (36.6 C)-99.3 F (37.4 C)] 98.6 F (37 C) (04/03 0411) Pulse Rate:  [62-106] 62 (04/03 0411) Resp:  [18-19] 18 (04/03 0411) BP: (134-148)/(52-61) 148/61 (04/03 0411) SpO2:  [93 %-97 %] 93 % (04/03 0411) Last BM Date: 09/23/17  Intake/Output from previous day: 04/02 0701 - 04/03 0700 In: 1046.3 [P.O.:240; I.V.:706.3; IV Piggyback:100] Out: 650 [Urine:650] Intake/Output this shift: No intake/output data recorded.  PE: Gen:  Alert, NAD, pleasant HEENT: EOM's intact, pupils equal and round Card:  RRR Pulm:  CTAB, no W/R/R, effort normal Abd: Soft, ND, +BS, no HSM, no hernia, +TTP RUQ/epigastric region with guarding. TTP right sided lateral ribs Ext:  1+ pitting edema BLE Psych: A&Ox3  Skin: no rashes noted, warm and dry  Lab Results:  Recent Labs    09/23/17 1048 09/24/17 0343  WBC 9.7 13.2*  HGB 10.7* 9.9*  HCT 31.6* 30.4*  PLT 199 135*   BMET Recent Labs    09/24/17 0343 09/25/17 0403  NA 138 140  K 4.2 4.0  CL 101 105  CO2 24 23  GLUCOSE 108* 90  BUN 28* 37*  CREATININE 2.41* 2.61*  CALCIUM 9.0 8.6*   PT/INR Recent Labs    09/23/17 1048  LABPROT 14.8  INR 1.17   CMP     Component Value Date/Time   NA 140 09/25/2017 0403   K 4.0 09/25/2017 0403   CL 105 09/25/2017 0403   CO2 23 09/25/2017 0403   GLUCOSE 90 09/25/2017 0403   BUN 37 (H) 09/25/2017 0403   CREATININE 2.61 (H) 09/25/2017 0403   CREATININE 2.80 (H) 07/15/2017 1405   CALCIUM 8.6 (L) 09/25/2017 0403   PROT 4.4 (L) 09/25/2017 0403    ALBUMIN 2.3 (L) 09/25/2017 0403   AST 116 (H) 09/25/2017 0403   ALT 63 09/25/2017 0403   ALKPHOS 67 09/25/2017 0403   BILITOT 4.3 (H) 09/25/2017 0403   GFRNONAA 22 (L) 09/25/2017 0403   GFRNONAA 21 (L) 07/15/2017 1405   GFRAA 25 (L) 09/25/2017 0403   GFRAA 24 (L) 07/15/2017 1405   Lipase     Component Value Date/Time   LIPASE 17 09/24/2017 0343       Studies/Results: Ct Abdomen Pelvis Wo Contrast  Result Date: 09/23/2017 CLINICAL DATA:  Abdominal pain.  Increasing abdominal pain EXAM: CT ABDOMEN AND PELVIS WITHOUT CONTRAST TECHNIQUE: Multidetector CT imaging of the abdomen and pelvis was performed following the standard protocol without IV contrast. COMPARISON:  None. FINDINGS: Lower chest: Mild bibasilar atelectasis. Hepatobiliary: No focal hepatic lesion on noncontrast exam. Gallbladder distended to 5.5 cm. No radiodense gallstones are present. Common bile duct appears normal caliber. Small amount of free fluid along the RIGHT hepatic margin Pancreas: Pancreas is normal. No ductal dilatation. No pancreatic inflammation. Spleen: Normal spleen Adrenals/urinary tract: Adrenal glands normal. No nephrolithiasis or ureterolithiasis. Vascular calcification in the RIGHT renal hilum. No bladder calculi Stomach/Bowel: Stomach, small bowel, appendix, and cecum are normal. Multiple sigmoid diverticula. No acute diverticulitis. Vascular/Lymphatic: Abdominal aorta is normal caliber with atherosclerotic calcification. There  is no retroperitoneal or periportal lymphadenopathy. No pelvic lymphadenopathy. Reproductive: Prostate normal Other: No free fluid. Musculoskeletal: No aggressive osseous lesion. IMPRESSION: 1. Gallbladder distension without of evidence gallbladder inflammation. No radiodense gallstones. Consider ultrasound if concern for cholecystitis. 2. Small free fluid along the RIGHT hepatic lobe. 3. Extensive sigmoid diverticulosis without evidence diverticulitis. 4.  Aortic Atherosclerosis  (ICD10-I70.0). Electronically Signed   By: Suzy Bouchard M.D.   On: 09/23/2017 15:33   Mr Abdomen Mrcp Wo Contrast  Result Date: 09/24/2017 CLINICAL DATA:  Inpatient. Epigastric/right upper quadrant abdominal pain. Abnormal liver function tests. Cholelithiasis. EXAM: MRI ABDOMEN WITHOUT CONTRAST  (INCLUDING MRCP) TECHNIQUE: Multiplanar multisequence MR imaging of the abdomen was performed. Heavily T2-weighted images of the biliary and pancreatic ducts were obtained, and three-dimensional MRCP images were rendered by post processing. COMPARISON:  09/23/2017 unenhanced CT abdomen/pelvis and right upper quadrant abdominal sonogram. FINDINGS: Lower chest: Hypoventilatory changes in the dependent lung bases bilaterally. Hepatobiliary: Normal liver size and configuration. No hepatic steatosis. There is a sub 5 mm T2 hyperintense posterior right liver lobe lesion, incompletely characterized on this noncontrast scan, for which no follow-up is required unless the patient has risk factors for liver malignancy. No additional liver lesions. Distended gallbladder (5.3 cm diameter). Layering sludge and tiny 2-3 mm gallstones in the gallbladder. Borderline mild diffuse gallbladder wall thickening (gallbladder wall thickness 3 mm). No pericholecystic fluid. No biliary ductal dilatation. Common bile duct diameter 6 mm. Small amount of layering sludge in the common bile duct, with no filling defects in the common bile duct to suggest choledocholithiasis. Small periampullary duodenal diverticulum. Pancreas: There is a 0.8 cm cystic pancreatic body lesion (series 13001/image 52) without appreciable wall thickening or internal complexity. No additional pancreatic lesions. No pancreatic duct dilation. No convincing pancreas divisum (the main pancreatic duct is drained by an accessory duct of [place]or[place], however also appears to communicate with the common bile duct). Spleen: Normal size. No mass. Adrenals/Urinary Tract: No adrenal  nodules. No hydronephrosis. Normal kidneys with no renal mass. Stomach/Bowel: Grossly normal stomach. Visualized small and large bowel is normal caliber, with no bowel wall thickening. Vascular/Lymphatic: Infrarenal 4.7 cm abdominal aortic aneurysm, stable. No pathologically enlarged lymph nodes in the abdomen. Other: Trace perihepatic ascites.  No focal fluid collection. Musculoskeletal: No aggressive appearing focal osseous lesions. Bilateral posterior spinal fusion hardware is visualized in the lower lumbar spine. IMPRESSION: 1. Layering sludge and tiny gallstones in the distended gallbladder with borderline mild diffuse gallbladder wall thickening. No pericholecystic fluid. These findings are nonspecific and could be compatible with acute cholecystitis in the correct clinical setting. 2. No biliary ductal dilatation. CBD diameter 6 mm. Small amount of layering sludge in the CBD, with no evidence of choledocholithiasis. 3. 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 Follow-up MRI abdomen without and with IV contrast recommended in 2 years. This recommendation follows ACR consensus guidelines: Management of Incidental Pancreatic Cysts: A White Paper of the ACR Incidental Findings Committee. [place] 7096;28:366-294. 4. Infrarenal 4.7 cm abdominal aortic aneurysm. Recommend followup by abdomen and pelvis CTA in 6 months, and vascular surgery referral/consultation if not already obtained. This recommendation follows ACR consensus guidelines: White Paper of the ACR Incidental Findings Committee II on Vascular Findings. J Am Coll Radiol 2013; 10:789-794. 5. Trace perihepatic ascites. Electronically Signed   By: Ilona Sorrel M.D.   On: 09/24/2017 18:09   Mr 3d Recon At Scanner  Result Date: 09/24/2017 CLINICAL DATA:  Inpatient. Epigastric/right upper  quadrant abdominal pain. Abnormal liver function tests. Cholelithiasis. EXAM: MRI ABDOMEN WITHOUT CONTRAST   (INCLUDING MRCP) TECHNIQUE: Multiplanar multisequence MR imaging of the abdomen was performed. Heavily T2-weighted images of the biliary and pancreatic ducts were obtained, and three-dimensional MRCP images were rendered by post processing. COMPARISON:  09/23/2017 unenhanced CT abdomen/pelvis and right upper quadrant abdominal sonogram. FINDINGS: Lower chest: Hypoventilatory changes in the dependent lung bases bilaterally. Hepatobiliary: Normal liver size and configuration. No hepatic steatosis. There is a sub 5 mm T2 hyperintense posterior right liver lobe lesion, incompletely characterized on this noncontrast scan, for which no follow-up is required unless the patient has risk factors for liver malignancy. No additional liver lesions. Distended gallbladder (5.3 cm diameter). Layering sludge and tiny 2-3 mm gallstones in the gallbladder. Borderline mild diffuse gallbladder wall thickening (gallbladder wall thickness 3 mm). No pericholecystic fluid. No biliary ductal dilatation. Common bile duct diameter 6 mm. Small amount of layering sludge in the common bile duct, with no filling defects in the common bile duct to suggest choledocholithiasis. Small periampullary duodenal diverticulum. Pancreas: There is a 0.8 cm cystic pancreatic body lesion (series 13001/image 52) without appreciable wall thickening or internal complexity. No additional pancreatic lesions. No pancreatic duct dilation. No convincing pancreas divisum (the main pancreatic duct is drained by an accessory duct of Santor[place], however also appears to communicate with the common bile duct). Spleen: Normal size. No mass. Adrenals/Urinary Tract: No adrenal nodules. No hydronephrosis. Normal kidneys with no renal mass. Stomach/Bowel: Grossly normal stomach. Visualized small and large bowel is normal caliber, with no bowel wall thickening. Vascular/Lymphatic: Infrarenal 4.7 cm abdominal aortic aneurysm, stable. No pathologically enlarged lymph nodes in the  abdomen. Other: Trace perihepatic ascites.  No focal fluid collection. Musculoskeletal: No aggressive appearing focal osseous lesions. Bilateral posterior spinal fusion hardware is visualized in the lower lumbar spine. IMPRESSION: 1. Layering sludge and tiny gallstones in the distended gallbladder with borderline mild diffuse gallbladder wall thickening. No pericholecystic fluid. These findings are nonspecific and could be compatible with acute cholecystitis in the correct clinical setting. 2. No biliary ductal dilatation. CBD diameter 6 mm. Small amount of layering sludge in the CBD, with no evidence of choledocholithiasis. 3. 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 Follow-up MRI abdomen without and with IV contrast recommended in 2 years. This recommendation follows ACR consensus guidelines: Management of Incidental Pancreatic Cysts: A White Paper of the ACR Incidental Findings Committee. [place] 2774;12:878-676. 4. Infrarenal 4.7 cm abdominal aortic aneurysm. Recommend followup by abdomen and pelvis CTA in 6 months, and vascular surgery referral/consultation if not already obtained. This recommendation follows ACR consensus guidelines: White Paper of the ACR Incidental Findings Committee II on Vascular Findings. J Am Coll Radiol 2013; 10:789-794. 5. Trace perihepatic ascites. Electronically Signed   By: Ilona Sorrel M.D.   On: 09/24/2017 18:09   US Abdomen Limited  Result Date: 09/23/2017 CLINICAL DATA:  Right upper quadrant pain for several days EXAM: ULTRASOUND ABDOMEN LIMITED RIGHT UPPER QUADRANT COMPARISON:  CT from earlier in the same day. FINDINGS: Gallbladder: Well distended with gallstones and sludge. No pericholecystic fluid or gallbladder wall thickening is noted. Negative sonographic Percell Miller sign is noted. Common bile duct: Diameter: 5.2 mm. Liver: No focal lesion identified. Within normal limits in parenchymal echogenicity. Portal vein is  patent on color Doppler imaging with normal direction of blood flow towards the liver. IMPRESSION: Cholelithiasis without definitive complicating factors. This may still be the etiology of the patient's  discomfort. Electronically Signed   By: Inez Catalina M.D.   On: 09/23/2017 16:49   Dg Chest Portable 1 View  Result Date: 09/23/2017 CLINICAL DATA:  Shortness of breath and pain EXAM: PORTABLE CHEST 1 VIEW COMPARISON:  June 27, 2017 FINDINGS: There is no edema or consolidation. Heart size and pulmonary vascularity are normal. No adenopathy. No pneumothorax. There is aortic atherosclerosis. IMPRESSION: Aortic atherosclerosis.  No edema or consolidation. Aortic Atherosclerosis (ICD10-I70.0). Electronically Signed   By: Lowella Grip III M.D.   On: 09/23/2017 11:23    Anti-infectives: Anti-infectives (From admission, onward)   Start     Dose/Rate Route Frequency Ordered Stop   09/24/17 1130  Ampicillin-Sulbactam (UNASYN) 3 g in sodium chloride 0.9 % 100 mL IVPB     3 g 200 mL/hr over 30 Minutes Intravenous Every 12 hours 09/24/17 1045     09/24/17 1045  meropenem (MERREM) 500 mg in sodium chloride 0.9 % 100 mL IVPB  Status:  Discontinued     500 mg 200 mL/hr over 30 Minutes Intravenous Every 12 hours 09/24/17 1036 09/24/17 1045       Assessment/Plan AAA HTN CAD s/p LAD and diagonal branch stenting back in 2006, 7 and 8 - holding plavix (last dose 4/1) Carotid artery disease Chronic RBBB CKD-IV HLD R rib pain - get CXR to eval for fx  Chronic cholecystitis Elevated LFTs - u/s showed Cholelithiasis without definitive complicating factors - MRCP showed Layering sludge and tiny gallstones in the distended gallbladder with borderline mild diffuse gallbladder wall thickening. No pericholecystic fluid; Small amount of layering sludge in the CBD, with no evidence of choledocholithiasis - LFTs trending down except bilirubin remaining high at 4.3  ID - unasyn 4/2>> FEN - IVF, NPO VTE -  SCDs Foley - none Follow up - TBD  Plan - Currently no plans for ERCP per GI PA, MRCP only showed sludge no stones in CBD; bilirubin still elevated but may take time to decrease in patient with CKD. Repeat labs in AM. Continue to hold plavix. Will discuss timing of cholecystectomy with MD. If patient is having no procedures today ok to have liquids from surgical standpoint, NPO after midnight. Cardiology eval pending for surgical clearance.   LOS: 1 day    Wellington Hampshire , [facility] 09/25/2017, 9:24 AM Pager: [phone] Consults: [phone] Mon-Fri 7:00 am-4:30 pm Sat-Sun 7:00 am-11:30 am

## 2017-09-25 NOTE — Consult Note (Signed)
Cardiology Consultation:   Patient ID: Terry Garner; [id number]; 1941-06-09   Admit date: 09/23/2017 Date of Consult: 09/25/2017  Primary Care Provider: [name], MD Primary Cardiologist: Quay Burow, MD Primary Electrophysiologist:  None   Patient Profile:   Terry Garner is a 77 y.o. male with a PMH of CAD s/p DES to LAD and diagonal branch (last LHC 2010 with patent stents), HTN, HLD, carotid artery stenosis (last duplex 08/2017 with >50% stenosis of Left ECA), moderate R renal artery stenosis, AAA (last duplex 08/2017 with largest measurement 4.5cm), CKD stage IV, who is being seen today for the evaluation of preoperative risk assessment at the request of Dr. Jonelle Sidle.  History of Present Illness:   [name] presented with complaints of RUQ/epigastric abdominal pain for the past 2 days. He has had associated nausea and vomiting. He was without diarrhea or fevers. Imaging concerning for gallbladder pathology and patient was started on IV antibiotics. Patient currently with improved pain.   He was last evaluated by Dr. [name] outpatient 10/2016 and thought to be doing well from a cardiac standpoint. He was without anginal complaints. He was to continue annual follow up for AAA and carotid artery disease, and continue current medication regimen. Last cardiac catheterization in 2010 with widely patent stents. Last AAA duplex 08/2017 with increase in aneurysm from 3.5 to 4.5cm. Last Carotid duplex with b/l mild stenosis and >50% stenosis of L ECA. Patient underwent an echocardiogram this admission with EF 55-60%, no wall motion abnormalities, and normal LV diastolic function.   From a cardiac standpoint he states he's been doing well. In recent months, he denies exertional chest pain or DOE. His activity is limited by chronic back pain for which he has had multiple surgeries. He reports he is able to walk to the mailbox or go on shopping trips with his wife without anginal complaints. He  notes some chronic LE edema for which he takes daily lasix. He denies orthopnea, PND, dizziness, lightheadedness, or syncope.   Hospital course: afebrile, VSS. Labs notable for hypokalemia (improved with repletion), Cr 2.61 (at baseline), AST 163>116, Tbili 3.4>4.3, WBC 13.2, Hgb 9.9, PLT 135. EKG with sinus rhythm with sinus arrhythmia, RBBB (chronic); relatively unchanged from previous. CT A/P with distended gallbladder without inflammation. RUQ [place] with cholelithiasis without complicating factors. MRCP with layering sludge and tiny gallstones with borderline mild diffuse gallbladder wall thickening. Patient admitted to medicine with Surgery and GI following; anticipating CCY this admission. Cardiology asked to evaluate for preoperative risk assessment.   Past Medical History:  Diagnosis Date  . AAA (abdominal aortic aneurysm) (HCC)    3.6 cm (01/09/12 ultrasound)  . Allergy   . Arthritis   . Carotid artery disease ([place])   . Cataract   . Chronic kidney disease    CKD, right renal artery stenosis  . Coronary artery disease    mild left internal carotid artery stenosis  . Glaucoma   . Gout   . Heart murmur   . Hyperlipidemia   . Hypertension   . Tubular adenoma of colon 2003   Dr. Watt Climes  . Vitamin D deficiency     Past Surgical History:  Procedure Laterality Date  . BACK SURGERY    . CARDIAC CATHETERIZATION     stents  last -07  . CARDIAC STENTS    . CERVICAL DISC SURGERY    . EYE SURGERY Left 06   retinal detachment  . HERNIA REPAIR Right 93  . LUMBAR LAMINECTOMY  06/06/2012   Procedure: MICRODISCECTOMY LUMBAR LAMINECTOMY;  Surgeon: Marybelle Killings, MD;  Location: [facility];  Service: Orthopedics;  Laterality: Left;  Left L4-5 Microdiscectomy  . SHOULDER ARTHROSCOPY Right   . TOTAL HIP ARTHROPLASTY Left      Home Medications:  Prior to Admission medications   Medication Sig Start Date End Date Taking? Authorizing Provider  allopurinol (ZYLOPRIM) 300 MG tablet Take 300 mg by mouth  daily.   Yes [provider]  amLODipine (NORVASC) 5 MG tablet Take 5 mg by mouth daily.   Yes [provider]  aspirin EC 81 MG tablet Take 81 mg by mouth daily.   Yes [provider]  brimonidine (ALPHAGAN) 0.15 % ophthalmic solution Place 1 drop into both eyes 2 (two) times daily.   Yes [provider]  Cholecalciferol (VITAMIN D3) 5000 UNITS TABS Take 5,000 Units by mouth daily.   Yes [provider]  clopidogrel (PLAVIX) 75 MG tablet TAKE 1 TABLET BY MOUTH ONCE DAILY FOR HEART STENTS 06/27/17  Yes [name], MD  dorzolamide-timolol (COSOPT) 22.3-6.8 MG/ML ophthalmic solution Place 1 drop into both eyes 2 (two) times daily.   Yes [provider]  fenofibrate micronized (LOFIBRA) 134 MG capsule TAKE ONE CAPSULE BY MOUTH ONCE DAILY 09/12/15  Yes Forcucci, Courtney, PA-C  furosemide (LASIX) 20 MG tablet Take 40 mg by mouth daily.    Yes [provider]  furosemide (LASIX) 40 MG tablet Take 40 mg by mouth daily. 08/29/17  Yes [provider]  loteprednol (LOTEMAX) 0.5 % ophthalmic suspension Place 1 drop into the right eye 2 (two) times daily.    Yes [provider]  nepafenac (ILEVRO) 0.3 % ophthalmic suspension Place 1 drop into the right eye daily.   Yes [provider]  pravastatin (PRAVACHOL) 40 MG tablet TAKE ONE TABLET BY MOUTH AT BEDTIME FOR  CHOLESTEROL 03/25/17  Yes [name], MD    Inpatient Medications: Scheduled Meds: . allopurinol  300 mg Oral Daily  . amLODipine  5 mg Oral Daily  . aspirin EC  81 mg Oral Daily  . brimonidine  1 drop Both Eyes BID  . chlorhexidine  15 mL Mouth Rinse BID  . cholecalciferol  5,000 Units Oral Daily  . dorzolamide-timolol  1 drop Both Eyes BID  . loteprednol  1 drop Right Eye BID  . mouth rinse  15 mL Mouth Rinse q12n4p  . nepafenac  1 drop Right Eye Daily   Continuous Infusions: . ampicillin-sulbactam (UNASYN) IV 3 g (09/25/17 1123)   PRN  Meds: morphine injection, ondansetron **OR** ondansetron (ZOFRAN) IV  Allergies:    Allergies  Allergen Reactions  . Toprol Xl [Metoprolol Tartrate] Other (See Comments)    Bradycardia with beta blockers  . Lipitor [Atorvastatin]     Myalgias   . Coumadin [Warfarin Sodium] Rash  . Tape Rash    Clear tape  . Warfarin Rash    Social History:   Social History   Socioeconomic History  . Marital status: Married    Spouse name: Not on file  . Number of children: Not on file  . Years of education: Not on file  . Highest education level: Not on file  Occupational History  . Not on file  Social Needs  . Financial resource strain: Not on file  . Food insecurity:    Worry: Not on file    Inability: Not on file  . Transportation needs:    Medical: Not on file  Non-medical: Not on file  Tobacco Use  . Smoking status: Never Smoker  . Smokeless tobacco: Never Used  Substance and Sexual Activity  . Alcohol use: No  . Drug use: No  . Sexual activity: Not on file  Lifestyle  . Physical activity:    Days per week: Not on file    Minutes per session: Not on file  . Stress: Not on file  Relationships  . Social connections:    Talks on phone: Not on file    Gets together: Not on file    Attends religious service: Not on file    Active member of club or organization: Not on file    Attends meetings of clubs or organizations: Not on file    Relationship status: Not on file  . Intimate partner violence:    Fear of current or ex partner: Not on file    Emotionally abused: Not on file    Physically abused: Not on file    Forced sexual activity: Not on file  Other Topics Concern  . Not on file  Social History Narrative  . Not on file    Family History:    Family History  Problem Relation Age of Onset  . Other Brother        laryngeal cancer  . Heart disease Brother   . Heart disease Mother   . Heart disease Father   . Stroke Father   . Heart disease Sister   .  Diabetes Brother   . Colon cancer Neg Hx   . Esophageal cancer Neg Hx   . Stomach cancer Neg Hx   . Rectal cancer Neg Hx      ROS:  Please see the history of present illness.   All other ROS reviewed and negative.     Physical Exam/Data:   Vitals:   09/24/17 0800 09/24/17 1322 09/24/17 2154 09/25/17 0411  BP: (!) 128/52 (!) 134/52 (!) 134/53 (!) 148/61  Pulse:  (!) 106 64 62  Resp:  18 19 18   Temp:  97.8 F (36.6 C) 99.3 F (37.4 C) 98.6 F (37 C)  TempSrc:  Oral Oral Oral  SpO2:  96% 97% 93%  Weight:      Height:        Intake/Output Summary (Last 24 hours) at 09/25/2017 1224 Last data filed at 09/25/2017 1008 Gross per 24 hour  Intake 1046.25 ml  Output 600 ml  Net 446.25 ml   Filed Weights   09/23/17 1049 09/23/17 2259  Weight: 170 lb (77.1 kg) 174 lb 9.7 oz (79.2 kg)   Body mass index is 21.82 kg/m.  General:  Well nourished, well developed, laying in bed in no acute distress HEENT: sclera anicteric; dry MM making it difficult to understand speech Neck: no JVD Vascular: + b/l carotid bruits; distal pulses 2+ bilaterally Cardiac:  normal S1, S2; RRR; +murmur, no gallops or rubs Lungs:  clear to auscultation bilaterally, no wheezing, rhonchi or rales  Abd: NABS, soft, mild TTP in RUQ and R lower ribs, no hepatomegaly Ext: no edema Musculoskeletal:  No deformities, BUE and BLE strength normal and equal Skin: warm and dry  Neuro:  CNs 2-12 intact, no focal abnormalities noted Psych:  Normal affect   EKG:  The EKG was personally reviewed and demonstrates:  Sinus rhythm with sinus arrhythmia, RBBB - no significant change from previous  Relevant CV Studies:  Echocardiogram 09/24/17: Study Conclusions  - Left ventricle: The cavity size was normal. Systolic  function was   normal. The estimated ejection fraction was in the range of 55%   to 60%. Wall motion was normal; there were no regional wall   motion abnormalities. Left ventricular diastolic function    parameters were normal. - Aortic valve: There was mild stenosis. - Mitral valve: Calcified annulus. Mildly thickened leaflets . - Left atrium: The atrium was mildly dilated. - Atrial septum: No defect or patent foramen ovale was identified. - Pericardium, extracardiac: Small mostly posterior pericardiial   effusion.  Laboratory Data:  Chemistry Recent Labs  Lab 09/23/17 1048 09/24/17 0343 09/25/17 0403  NA 140 138 140  K 3.1* 4.2 4.0  CL 103 101 105  CO2 26 24 23   GLUCOSE 145* 108* 90  BUN 27* 28* 37*  CREATININE 2.59* 2.41* 2.61*  CALCIUM 9.3 9.0 8.6*  GFRNONAA 22* 24* 22*  GFRAA 26* 28* 25*  ANIONGAP 11 13 12     Recent Labs  Lab 09/23/17 1130 09/24/17 0343 09/25/17 0403  PROT 5.9* 5.1* 4.4*  ALBUMIN 3.5 2.7* 2.3*  AST 163* 155* 116*  ALT 53 70* 63  ALKPHOS 66 61 67  BILITOT 3.4* 4.3* 4.3*   Hematology Recent Labs  Lab 09/23/17 1048 09/24/17 0343  WBC 9.7 13.2*  RBC 3.43* 3.33*  HGB 10.7* 9.9*  HCT 31.6* 30.4*  MCV 92.1 91.3  MCH 31.2 29.7  MCHC 33.9 32.6  RDW 15.0 14.8  PLT 199 135*   Cardiac EnzymesNo results for input(s): TROPONINI in the last 168 hours.  Recent Labs  Lab 09/23/17 1113  TROPIPOC 0.03    BNPNo results for input(s): BNP, PROBNP in the last 168 hours.  DDimer No results for input(s): DDIMER in the last 168 hours.  Radiology/Studies:  Ct Abdomen Pelvis Wo Contrast  Result Date: 09/23/2017 CLINICAL DATA:  Abdominal pain.  Increasing abdominal pain EXAM: CT ABDOMEN AND PELVIS WITHOUT CONTRAST TECHNIQUE: Multidetector CT imaging of the abdomen and pelvis was performed following the standard protocol without IV contrast. COMPARISON:  None. FINDINGS: Lower chest: Mild bibasilar atelectasis. Hepatobiliary: No focal hepatic lesion on noncontrast exam. Gallbladder distended to 5.5 cm. No radiodense gallstones are present. Common bile duct appears normal caliber. Small amount of free fluid along the RIGHT hepatic margin Pancreas: Pancreas is  normal. No ductal dilatation. No pancreatic inflammation. Spleen: Normal spleen Adrenals/urinary tract: Adrenal glands normal. No nephrolithiasis or ureterolithiasis. Vascular calcification in the RIGHT renal hilum. No bladder calculi Stomach/Bowel: Stomach, small bowel, appendix, and cecum are normal. Multiple sigmoid diverticula. No acute diverticulitis. Vascular/Lymphatic: Abdominal aorta is normal caliber with atherosclerotic calcification. There is no retroperitoneal or periportal lymphadenopathy. No pelvic lymphadenopathy. Reproductive: Prostate normal Other: No free fluid. Musculoskeletal: No aggressive osseous lesion. IMPRESSION: 1. Gallbladder distension without of evidence gallbladder inflammation. No radiodense gallstones. Consider ultrasound if concern for cholecystitis. 2. Small free fluid along the RIGHT hepatic lobe. 3. Extensive sigmoid diverticulosis without evidence diverticulitis. 4.  Aortic Atherosclerosis (ICD10-I70.0). Electronically Signed   By: Suzy Bouchard M.D.   On: 09/23/2017 15:33   Mr Abdomen Mrcp Wo Contrast  Result Date: 09/24/2017 CLINICAL DATA:  Inpatient. Epigastric/right upper quadrant abdominal pain. Abnormal liver function tests. Cholelithiasis. EXAM: MRI ABDOMEN WITHOUT CONTRAST  (INCLUDING MRCP) TECHNIQUE: Multiplanar multisequence MR imaging of the abdomen was performed. Heavily T2-weighted images of the biliary and pancreatic ducts were obtained, and three-dimensional MRCP images were rendered by post processing. COMPARISON:  09/23/2017 unenhanced CT abdomen/pelvis and right upper quadrant abdominal sonogram. FINDINGS: Lower chest: Hypoventilatory changes in the dependent  lung bases bilaterally. Hepatobiliary: Normal liver size and configuration. No hepatic steatosis. There is a sub 5 mm T2 hyperintense posterior right liver lobe lesion, incompletely characterized on this noncontrast scan, for which no follow-up is required unless the patient has risk factors for liver  malignancy. No additional liver lesions. Distended gallbladder (5.3 cm diameter). Layering sludge and tiny 2-3 mm gallstones in the gallbladder. Borderline mild diffuse gallbladder wall thickening (gallbladder wall thickness 3 mm). No pericholecystic fluid. No biliary ductal dilatation. Common bile duct diameter 6 mm. Small amount of layering sludge in the common bile duct, with no filling defects in the common bile duct to suggest choledocholithiasis. Small periampullary duodenal diverticulum. Pancreas: There is a 0.8 cm cystic pancreatic body lesion (series 13001/image 52) without appreciable wall thickening or internal complexity. No additional pancreatic lesions. No pancreatic duct dilation. No convincing pancreas divisum (the main pancreatic duct is drained by an accessory duct of [place], however also appears to communicate with the common bile duct). Spleen: Normal size. No mass. Adrenals/Urinary Tract: No adrenal nodules. No hydronephrosis. Normal kidneys with no renal mass. Stomach/Bowel: Grossly normal stomach. Visualized small and large bowel is normal caliber, with no bowel wall thickening. Vascular/Lymphatic: Infrarenal 4.7 cm abdominal aortic aneurysm, stable. No pathologically enlarged lymph nodes in the abdomen. Other: Trace perihepatic ascites.  No focal fluid collection. Musculoskeletal: No aggressive appearing focal osseous lesions. Bilateral posterior spinal fusion hardware is visualized in the lower lumbar spine. IMPRESSION: 1. Layering sludge and tiny gallstones in the distended gallbladder with borderline mild diffuse gallbladder wall thickening. No pericholecystic fluid. These findings are nonspecific and could be compatible with acute cholecystitis in the correct clinical setting. 2. No biliary ductal dilatation. CBD diameter 6 mm. Small amount of layering sludge in the CBD, with no evidence of choledocholithiasis. 3. 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 Follow-up MRI abdomen without and with IV contrast recommended in 2 years. This recommendation follows ACR consensus guidelines: Management of Incidental Pancreatic Cysts: A White Paper of the ACR Incidental Findings Committee. [place] 2122;48:250-037. 4. Infrarenal 4.7 cm abdominal aortic aneurysm. Recommend followup by abdomen and pelvis CTA in 6 months, and vascular surgery referral/consultation if not already obtained. This recommendation follows ACR consensus guidelines: White Paper of the ACR Incidental Findings Committee II on Vascular Findings. J Am Coll Radiol 2013; 10:789-794. 5. Trace perihepatic ascites. Electronically Signed   By: Ilona Sorrel M.D.   On: 09/24/2017 18:09   Mr 3d Recon At Scanner  Result Date: 09/24/2017 CLINICAL DATA:  Inpatient. Epigastric/right upper quadrant abdominal pain. Abnormal liver function tests. Cholelithiasis. EXAM: MRI ABDOMEN WITHOUT CONTRAST  (INCLUDING MRCP) TECHNIQUE: Multiplanar multisequence MR imaging of the abdomen was performed. Heavily T2-weighted images of the biliary and pancreatic ducts were obtained, and three-dimensional MRCP images were rendered by post processing. COMPARISON:  09/23/2017 unenhanced CT abdomen/pelvis and right upper quadrant abdominal sonogram. FINDINGS: Lower chest: Hypoventilatory changes in the dependent lung bases bilaterally. Hepatobiliary: Normal liver size and configuration. No hepatic steatosis. There is a sub 5 mm T2 hyperintense posterior right liver lobe lesion, incompletely characterized on this noncontrast scan, for which no follow-up is required unless the patient has risk factors for liver malignancy. No additional liver lesions. Distended gallbladder (5.3 cm diameter). Layering sludge and tiny 2-3 mm gallstones in the gallbladder. Borderline mild diffuse gallbladder wall thickening (gallbladder wall thickness 3 mm). No pericholecystic fluid. No biliary ductal dilatation. Common bile duct  diameter 6 mm. Small  amount of layering sludge in the common bile duct, with no filling defects in the common bile duct to suggest choledocholithiasis. Small periampullary duodenal diverticulum. Pancreas: There is a 0.8 cm cystic pancreatic body lesion (series 13001/image 52) without appreciable wall thickening or internal complexity. No additional pancreatic lesions. No pancreatic duct dilation. No convincing pancreas divisum (the main pancreatic duct is drained by an accessory duct of [place], however also appears to communicate with the common bile duct). Spleen: Normal size. No mass. Adrenals/Urinary Tract: No adrenal nodules. No hydronephrosis. Normal kidneys with no renal mass. Stomach/Bowel: Grossly normal stomach. Visualized small and large bowel is normal caliber, with no bowel wall thickening. Vascular/Lymphatic: Infrarenal 4.7 cm abdominal aortic aneurysm, stable. No pathologically enlarged lymph nodes in the abdomen. Other: Trace perihepatic ascites.  No focal fluid collection. Musculoskeletal: No aggressive appearing focal osseous lesions. Bilateral posterior spinal fusion hardware is visualized in the lower lumbar spine. IMPRESSION: 1. Layering sludge and tiny gallstones in the distended gallbladder with borderline mild diffuse gallbladder wall thickening. No pericholecystic fluid. These findings are nonspecific and could be compatible with acute cholecystitis in the correct clinical setting. 2. No biliary ductal dilatation. CBD diameter 6 mm. Small amount of layering sludge in the CBD, with no evidence of choledocholithiasis. 3. 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 Follow-up MRI abdomen without and with IV contrast recommended in 2 years. This recommendation follows ACR consensus guidelines: Management of Incidental Pancreatic Cysts: A White Paper of the ACR Incidental Findings Committee. [place] 0998;33:[id number]. 4. Infrarenal 4.7 cm  abdominal aortic aneurysm. Recommend followup by abdomen and pelvis CTA in 6 months, and vascular surgery referral/consultation if not already obtained. This recommendation follows ACR consensus guidelines: White Paper of the ACR Incidental Findings Committee II on Vascular Findings. J Am Coll Radiol 2013; 10:789-794. 5. Trace perihepatic ascites. Electronically Signed   By: Ilona Sorrel M.D.   On: 09/24/2017 18:09   US Abdomen Limited  Result Date: 09/23/2017 CLINICAL DATA:  Right upper quadrant pain for several days EXAM: ULTRASOUND ABDOMEN LIMITED RIGHT UPPER QUADRANT COMPARISON:  CT from earlier in the same day. FINDINGS: Gallbladder: Well distended with gallstones and sludge. No pericholecystic fluid or gallbladder wall thickening is noted. Negative sonographic Percell Miller sign is noted. Common bile duct: Diameter: 5.2 mm. Liver: No focal lesion identified. Within normal limits in parenchymal echogenicity. Portal vein is patent on color Doppler imaging with normal direction of blood flow towards the liver. IMPRESSION: Cholelithiasis without definitive complicating factors. This may still be the etiology of the patient's discomfort. Electronically Signed   By: Inez Catalina M.D.   On: 09/23/2017 16:49   Dg Chest Portable 1 View  Result Date: 09/23/2017 CLINICAL DATA:  Shortness of breath and pain EXAM: PORTABLE CHEST 1 VIEW COMPARISON:  June 27, 2017 FINDINGS: There is no edema or consolidation. Heart size and pulmonary vascularity are normal. No adenopathy. No pneumothorax. There is aortic atherosclerosis. IMPRESSION: Aortic atherosclerosis.  No edema or consolidation. Aortic Atherosclerosis (ICD10-I70.0). Electronically Signed   By: Lowella Grip III M.D.   On: 09/23/2017 11:23    Assessment and Plan:   1. Preoperative risk assessment: patient presented with RUQ abdominal pain, c/f cholecystis. Surgery following with tentative plans for CCY 4/4. Patient reports doing well from a cardiac standpoint.  He has been without anginal complaints in recent months. His activity is limited by chronic back pain. His last ischemic evaluation was a LHC 2010 with patent LAD and  diagonal branch stents. Echo this admission with EF  60-65%, no wall motion abnormalities, and normal LV diastolic function.  - Based on the Revised Cardiac Risk Index for preoperative risk assessment, this patient has a score of 2 (history of ischemic heart disease and preoperative Cr >2) with a 10.1% 30-day risk of adverse cardiac event. In the event that an open CCY is indicated, his risk would be higher for a higher risk surgery.  - Do not anticipate further ischemic evaluation prior to surgical intervention.   2. CAD s/p PCI to LAD and diagonal branch: Last LHC 2010 with patent stents. He has been without anginal complaints in recent months.  - Plavix on hold in anticipation of possible CCY - restart when cleared by surgery - Continue ASA  3. Carotid artery disease: Last Carotid duplex with b/l mild stenosis and >50% stenosis of L ECA.  - Continue outpatient annual monitoring   4. Abdominal aortic aneurysm: Last AAA duplex 08/2017 with increase in aneurysm from 3.5 to 4.5cm.  - Continue outpatient annual monitoring  5. HTN: BP stable - Continue amlodipine and lasix  6. HLD: LDL 73 06/2017; goal <70 - Continue statin  7. Right bundle branch block: Chronic right bundle-branch block and sinus bradycardia without symptoms.  For questions or updates, please contact [place] Please consult [email or web address] for contact info under Cardiology/STEMI.   Signed, Abigail Butts, PA-C  09/25/2017 12:24 PM [phone]

## 2017-09-26 ENCOUNTER — Encounter (HOSPITAL_COMMUNITY): Payer: Self-pay | Admitting: *Deleted

## 2017-09-26 ENCOUNTER — Inpatient Hospital Stay (HOSPITAL_COMMUNITY): Payer: Medicare Other | Admitting: Anesthesiology

## 2017-09-26 ENCOUNTER — Encounter (HOSPITAL_COMMUNITY)
Admission: EM | Disposition: A | Discharge status: Home Health | Payer: Self-pay | Source: Home / Self Care | Attending: Internal Medicine

## 2017-09-26 HISTORY — PX: CHOLECYSTECTOMY: SHX55

## 2017-09-26 LAB — POCT I-STAT 4, (NA,K, GLUC, HGB,HCT)
Glucose, Bld: 108 mg/dL — ABNORMAL HIGH (ref 65–99)
HCT: 28 % — ABNORMAL LOW (ref 39.0–52.0)
HEMOGLOBIN: 9.5 g/dL — AB (ref 13.0–17.0)
POTASSIUM: 4.2 mmol/L (ref 3.5–5.1)
Sodium: 142 mmol/L (ref 135–145)

## 2017-09-26 LAB — CBC
HEMATOCRIT: 28.5 % — AB (ref 39.0–52.0)
HEMATOCRIT: 28.4 % — AB (ref 39.0–52.0)
HEMOGLOBIN: 9.7 g/dL — AB (ref 13.0–17.0)
Hemoglobin: 9.5 g/dL — ABNORMAL LOW (ref 13.0–17.0)
MCH: 31.3 pg (ref 26.0–34.0)
MCH: 30.2 pg (ref 26.0–34.0)
MCHC: 34 g/dL (ref 30.0–36.0)
MCHC: 33.5 g/dL (ref 30.0–36.0)
MCV: 91.9 fL (ref 78.0–100.0)
MCV: 90.2 fL (ref 78.0–100.0)
PLATELETS: 143 10*3/uL — AB (ref 150–400)
Platelets: 147 10*3/uL — ABNORMAL LOW (ref 150–400)
RBC: 3.1 MIL/uL — ABNORMAL LOW (ref 4.22–5.81)
RBC: 3.15 MIL/uL — AB (ref 4.22–5.81)
RDW: 15.3 % (ref 11.5–15.5)
RDW: 14.8 % (ref 11.5–15.5)
WBC: 8.7 10*3/uL (ref 4.0–10.5)
WBC: 9.3 10*3/uL (ref 4.0–10.5)

## 2017-09-26 LAB — COMPREHENSIVE METABOLIC PANEL
ALBUMIN: 2.2 g/dL — AB (ref 3.5–5.0)
ALK PHOS: 94 U/L (ref 38–126)
ALT: 54 U/L (ref 17–63)
ANION GAP: 8 (ref 5–15)
AST: 82 U/L — AB (ref 15–41)
BUN: 35 mg/dL — AB (ref 6–20)
CO2: 22 mmol/L (ref 22–32)
Calcium: 8.7 mg/dL — ABNORMAL LOW (ref 8.9–10.3)
Chloride: 107 mmol/L (ref 101–111)
Creatinine, Ser: 2.17 mg/dL — ABNORMAL HIGH (ref 0.61–1.24)
GFR calc Af Amer: 32 mL/min — ABNORMAL LOW (ref >60)
GFR calc non Af Amer: 27 mL/min — ABNORMAL LOW (ref >60)
GLUCOSE: 99 mg/dL (ref 65–99)
POTASSIUM: 4 mmol/L (ref 3.5–5.1)
SODIUM: 137 mmol/L (ref 135–145)
Total Bilirubin: 3.5 mg/dL — ABNORMAL HIGH (ref 0.3–1.2)
Total Protein: 4.4 g/dL — ABNORMAL LOW (ref 6.5–8.1)

## 2017-09-26 LAB — PREPARE RBC (CROSSMATCH)

## 2017-09-26 SURGERY — LAPAROSCOPIC CHOLECYSTECTOMY WITH INTRAOPERATIVE CHOLANGIOGRAM
Anesthesia: General | Site: Abdomen

## 2017-09-26 MED ORDER — ONDANSETRON HCL 4 MG/2ML IJ SOLN
INTRAMUSCULAR | Status: AC
  Filled 2017-09-26: qty 2

## 2017-09-26 MED ORDER — PHENYLEPHRINE 40 MCG/ML (10ML) SYRINGE FOR IV PUSH (FOR BLOOD PRESSURE SUPPORT)
PREFILLED_SYRINGE | INTRAVENOUS | Status: AC
  Filled 2017-09-26: qty 10

## 2017-09-26 MED ORDER — SODIUM CHLORIDE 0.9 % IV SOLN
INTRAVENOUS | Status: DC
  Administered 2017-09-27 – 2017-10-01 (×6): via INTRAVENOUS

## 2017-09-26 MED ORDER — FENTANYL CITRATE (PF) 100 MCG/2ML IJ SOLN
25.0000 ug | INTRAMUSCULAR | Status: DC | PRN
  Administered 2017-09-26: 25 ug via INTRAVENOUS
  Administered 2017-09-26: 50 ug via INTRAVENOUS

## 2017-09-26 MED ORDER — BUPIVACAINE-EPINEPHRINE (PF) 0.25% -1:200000 IJ SOLN
INTRAMUSCULAR | Status: AC
  Filled 2017-09-26: qty 30

## 2017-09-26 MED ORDER — EPHEDRINE 5 MG/ML INJ
INTRAVENOUS | Status: AC
  Filled 2017-09-26: qty 10

## 2017-09-26 MED ORDER — FENTANYL CITRATE (PF) 250 MCG/5ML IJ SOLN
INTRAMUSCULAR | Status: AC
Start: 2017-09-26 — End: ?
  Filled 2017-09-26: qty 5

## 2017-09-26 MED ORDER — HEMOSTATIC AGENTS (NO CHARGE) OPTIME
TOPICAL | Status: DC | PRN
  Administered 2017-09-26 (×4): 1 via TOPICAL

## 2017-09-26 MED ORDER — LIDOCAINE HCL (CARDIAC) 20 MG/ML IV SOLN
INTRAVENOUS | Status: AC
  Filled 2017-09-26: qty 5

## 2017-09-26 MED ORDER — SODIUM CHLORIDE 0.9 % IR SOLN
Status: DC | PRN
  Administered 2017-09-26 (×3): 3000 mL
  Administered 2017-09-26: 1000 mL

## 2017-09-26 MED ORDER — FENTANYL CITRATE (PF) 100 MCG/2ML IJ SOLN
INTRAMUSCULAR | Status: AC
  Administered 2017-09-26: 50 ug via INTRAVENOUS
  Filled 2017-09-26: qty 2

## 2017-09-26 MED ORDER — NEOSTIGMINE METHYLSULFATE 10 MG/10ML IV SOLN
INTRAVENOUS | Status: DC | PRN
  Administered 2017-09-26: 4 mg via INTRAVENOUS

## 2017-09-26 MED ORDER — DEXAMETHASONE SODIUM PHOSPHATE 10 MG/ML IJ SOLN
INTRAMUSCULAR | Status: AC
  Filled 2017-09-26: qty 1

## 2017-09-26 MED ORDER — ROCURONIUM BROMIDE 100 MG/10ML IV SOLN
INTRAVENOUS | Status: DC | PRN
  Administered 2017-09-26: 40 mg via INTRAVENOUS

## 2017-09-26 MED ORDER — ACETAMINOPHEN 325 MG PO TABS
650.0000 mg | ORAL_TABLET | Freq: Four times a day (QID) | ORAL | Status: DC | PRN
  Administered 2017-09-28 – 2017-09-29 (×2): 650 mg via ORAL
  Filled 2017-09-26 (×2): qty 2

## 2017-09-26 MED ORDER — IOPAMIDOL (ISOVUE-300) INJECTION 61%
INTRAVENOUS | Status: AC
  Filled 2017-09-26: qty 50

## 2017-09-26 MED ORDER — PHENYLEPHRINE HCL 10 MG/ML IJ SOLN
INTRAMUSCULAR | Status: DC | PRN
  Administered 2017-09-26 (×5): 80 ug via INTRAVENOUS

## 2017-09-26 MED ORDER — STERILE WATER FOR IRRIGATION IR SOLN
Status: DC | PRN
  Administered 2017-09-26: 1000 mL

## 2017-09-26 MED ORDER — SUCCINYLCHOLINE CHLORIDE 200 MG/10ML IV SOSY
PREFILLED_SYRINGE | INTRAVENOUS | Status: AC
  Filled 2017-09-26: qty 10

## 2017-09-26 MED ORDER — ALBUMIN HUMAN 5 % IV SOLN
INTRAVENOUS | Status: DC | PRN
  Administered 2017-09-26: 13:00:00 via INTRAVENOUS

## 2017-09-26 MED ORDER — OXYCODONE HCL 5 MG PO TABS: 5.0000 mg | ORAL_TABLET | ORAL | Status: DC | PRN

## 2017-09-26 MED ORDER — 0.9 % SODIUM CHLORIDE (POUR BTL) OPTIME
TOPICAL | Status: DC | PRN
  Administered 2017-09-26: 1000 mL

## 2017-09-26 MED ORDER — DEXTROSE 5 % IV SOLN
INTRAVENOUS | Status: DC | PRN
  Administered 2017-09-26: 25 ug/min via INTRAVENOUS

## 2017-09-26 MED ORDER — FENTANYL CITRATE (PF) 100 MCG/2ML IJ SOLN
INTRAMUSCULAR | Status: DC | PRN
  Administered 2017-09-26: 50 ug via INTRAVENOUS
  Administered 2017-09-26: 100 ug via INTRAVENOUS

## 2017-09-26 MED ORDER — PROPOFOL 10 MG/ML IV BOLUS
INTRAVENOUS | Status: AC
Start: 2017-09-26 — End: ?
  Filled 2017-09-26: qty 20

## 2017-09-26 MED ORDER — GLYCOPYRROLATE 0.2 MG/ML IJ SOLN
INTRAMUSCULAR | Status: DC | PRN
  Administered 2017-09-26: 0.1 mg via INTRAVENOUS
  Administered 2017-09-26: 0.6 mg via INTRAVENOUS

## 2017-09-26 MED ORDER — BUPIVACAINE-EPINEPHRINE 0.25% -1:200000 IJ SOLN
INTRAMUSCULAR | Status: DC | PRN
  Administered 2017-09-26: 11 mL

## 2017-09-26 MED ORDER — ROCURONIUM BROMIDE 10 MG/ML (PF) SYRINGE
PREFILLED_SYRINGE | INTRAVENOUS | Status: AC
  Filled 2017-09-26: qty 5

## 2017-09-26 MED ORDER — SODIUM CHLORIDE 0.9 % IV SOLN
Freq: Once | INTRAVENOUS | Status: AC
  Administered 2017-09-28: 10:00:00 via INTRAVENOUS

## 2017-09-26 MED ORDER — SUCCINYLCHOLINE 20MG/ML (10ML) SYRINGE FOR MEDFUSION PUMP - OPTIME
INTRAMUSCULAR | Status: DC | PRN
  Administered 2017-09-26: 100 mg via INTRAVENOUS

## 2017-09-26 MED ORDER — PIPERACILLIN-TAZOBACTAM 3.375 G IVPB
3.3750 g | Freq: Three times a day (TID) | INTRAVENOUS | Status: DC
  Administered 2017-09-26 – 2017-10-01 (×13): 3.375 g via INTRAVENOUS
  Filled 2017-09-26 (×15): qty 50

## 2017-09-26 MED ORDER — PROPOFOL 10 MG/ML IV BOLUS
INTRAVENOUS | Status: DC | PRN
  Administered 2017-09-26: 120 mg via INTRAVENOUS

## 2017-09-26 MED ORDER — SODIUM CHLORIDE 0.9 % IV SOLN
3.0000 g | Freq: Three times a day (TID) | INTRAVENOUS | Status: DC
  Administered 2017-09-26 – 2017-09-27 (×2): 3 g via INTRAVENOUS
  Filled 2017-09-26 (×3): qty 3

## 2017-09-26 MED ORDER — EPHEDRINE SULFATE 50 MG/ML IJ SOLN
INTRAMUSCULAR | Status: DC | PRN
  Administered 2017-09-26: 10 mg via INTRAVENOUS

## 2017-09-26 MED ORDER — SODIUM CHLORIDE 0.9 % IV SOLN
INTRAVENOUS | Status: DC
  Administered 2017-09-26 (×2): via INTRAVENOUS

## 2017-09-26 MED ORDER — MORPHINE SULFATE (PF) 4 MG/ML IV SOLN
2.0000 mg | INTRAVENOUS | Status: DC | PRN
  Administered 2017-09-26: 2 mg via INTRAVENOUS

## 2017-09-26 MED ORDER — LIDOCAINE HCL (CARDIAC) 20 MG/ML IV SOLN
INTRAVENOUS | Status: DC | PRN
  Administered 2017-09-26: 40 mg via INTRAVENOUS

## 2017-09-26 SURGICAL SUPPLY — 54 items
ADH SKN CLS APL DERMABOND .7 (GAUZE/BANDAGES/DRESSINGS) ×1
APPLIER CLIP 5 13 M/L LIGAMAX5 (MISCELLANEOUS) ×2
APR CLP MED LRG 5 ANG JAW (MISCELLANEOUS) ×1
BAG SPEC RTRVL 10 TROC 200 (ENDOMECHANICALS) ×1
BLADE CLIPPER SURG (BLADE) ×1 IMPLANT
CANISTER SUCT 3000ML PPV (MISCELLANEOUS) ×2 IMPLANT
CHLORAPREP W/TINT 26ML (MISCELLANEOUS) ×2 IMPLANT
CLIP APPLIE 5 13 M/L LIGAMAX5 (MISCELLANEOUS) ×1 IMPLANT
COVER MAYO STAND STRL (DRAPES) ×2 IMPLANT
COVER SURGICAL LIGHT HANDLE (MISCELLANEOUS) ×2 IMPLANT
DERMABOND ADVANCED (GAUZE/BANDAGES/DRESSINGS) ×1
DERMABOND ADVANCED .7 DNX12 (GAUZE/BANDAGES/DRESSINGS) ×1 IMPLANT
DEVICE TROCAR PUNCTURE CLOSURE (ENDOMECHANICALS) ×2 IMPLANT
DRAIN CHANNEL 15F RND FF W/TCR (WOUND CARE) IMPLANT
DRAIN CHANNEL 19F RND (DRAIN) ×1 IMPLANT
ELECT REM PT RETURN 9FT ADLT (ELECTROSURGICAL) ×2
ELECTRODE REM PT RTRN 9FT ADLT (ELECTROSURGICAL) ×1 IMPLANT
EVACUATOR SILICONE 100CC (DRAIN) ×1 IMPLANT
FLUID NSS /IRRIG 3000 ML XXX (IV SOLUTION) ×3 IMPLANT
GAUZE SPONGE 4X4 12PLY STRL (GAUZE/BANDAGES/DRESSINGS) ×1 IMPLANT
GLOVE BIOGEL PI IND STRL 6.5 (GLOVE) IMPLANT
GLOVE BIOGEL PI IND STRL 7.0 (GLOVE) IMPLANT
GLOVE BIOGEL PI IND STRL 7.5 (GLOVE) ×1 IMPLANT
GLOVE BIOGEL PI INDICATOR 6.5 (GLOVE) ×1
GLOVE BIOGEL PI INDICATOR 7.0 (GLOVE) ×2
GLOVE BIOGEL PI INDICATOR 7.5 (GLOVE) ×1
GLOVE SURG SS PI 6.5 STRL IVOR (GLOVE) ×2 IMPLANT
GLOVE SURG SS PI 7.0 STRL IVOR (GLOVE) ×4 IMPLANT
GOWN STRL REUS W/ TWL LRG LVL3 (GOWN DISPOSABLE) ×3 IMPLANT
GOWN STRL REUS W/TWL LRG LVL3 (GOWN DISPOSABLE) ×8
HEMOSTAT SNOW SURGICEL 2X4 (HEMOSTASIS) ×4 IMPLANT
KIT BASIN OR (CUSTOM PROCEDURE TRAY) ×2 IMPLANT
KIT TURNOVER KIT B (KITS) ×2 IMPLANT
NS IRRIG 1000ML POUR BTL (IV SOLUTION) ×2 IMPLANT
PAD ARMBOARD 7.5X6 YLW CONV (MISCELLANEOUS) ×2 IMPLANT
POUCH RETRIEVAL ECOSAC 10 (ENDOMECHANICALS) ×1 IMPLANT
POUCH RETRIEVAL ECOSAC 10MM (ENDOMECHANICALS) ×1
SCISSORS LAP 5X35 DISP (ENDOMECHANICALS) ×2 IMPLANT
SET IRRIG TUBING LAPAROSCOPIC (IRRIGATION / IRRIGATOR) ×2 IMPLANT
SLEEVE ENDOPATH XCEL 5M (ENDOMECHANICALS) ×4 IMPLANT
SPECIMEN JAR SMALL (MISCELLANEOUS) ×2 IMPLANT
STRIP CLOSURE SKIN 1/2X4 (GAUZE/BANDAGES/DRESSINGS) ×2 IMPLANT
SUT ETHILON 2 0 FS 18 (SUTURE) ×2 IMPLANT
SUT MNCRL AB 4-0 PS2 18 (SUTURE) ×2 IMPLANT
SUT VIC AB 2-0 SH 27 (SUTURE) ×2
SUT VIC AB 2-0 SH 27XBRD (SUTURE) IMPLANT
SUT VICRYL 0 UR6 27IN ABS (SUTURE) ×3 IMPLANT
TOWEL GREEN STERILE FF (TOWEL DISPOSABLE) ×1 IMPLANT
TRAY LAPAROSCOPIC MC (CUSTOM PROCEDURE TRAY) ×2 IMPLANT
TROCAR BLADELESS 12MM (ENDOMECHANICALS) ×1 IMPLANT
TROCAR XCEL BLUNT TIP 100MML (ENDOMECHANICALS) ×2 IMPLANT
TROCAR XCEL NON-BLD 5MMX100MML (ENDOMECHANICALS) ×2 IMPLANT
TUBING INSUFFLATION (TUBING) ×2 IMPLANT
WATER STERILE IRR 1000ML POUR (IV SOLUTION) ×2 IMPLANT

## 2017-09-26 NOTE — Anesthesia Postprocedure Evaluation (Signed)
Anesthesia Post Note  Patient: Terry Garner  Procedure(s) Performed: LAPAROSCOPIC CHOLECYSTECTOMY (N/A Abdomen)     Patient location during evaluation: PACU Anesthesia Type: General Level of consciousness: awake and alert, oriented and patient cooperative Pain management: pain level controlled Vital Signs Assessment: post-procedure vital signs reviewed and stable Respiratory status: spontaneous breathing, nonlabored ventilation, respiratory function stable and patient connected to nasal cannula oxygen Cardiovascular status: blood pressure returned to baseline and stable Postop Assessment: no apparent nausea or vomiting Anesthetic complications: no    Last Vitals:  Vitals:   09/26/17 1505 09/26/17 1534  BP: (!) 149/67 (!) 153/61  Pulse: 65 60  Resp: 19 14  Temp:  (!) 36.3 C  SpO2: 95% 95%    Last Pain:  Vitals:   09/26/17 1534  TempSrc: Oral  PainSc:                  Terry Garner,E. Krupa Stege

## 2017-09-26 NOTE — Progress Notes (Signed)
Subjective/Chief Complaint: ruq pain   Objective: Vital signs in last 24 hours: Temp:  [97.6 F (36.4 C)-99.3 F (37.4 C)] 98 F (36.7 C) (04/04 0419) Pulse Rate:  [61-63] 62 (04/04 0419) Resp:  [18] 18 (04/04 0419) BP: (154-168)/(62-66) 154/66 (04/04 0419) SpO2:  [92 %-94 %] 92 % (04/04 0419) Weight:  [84.3 kg (185 lb 13.6 oz)] 84.3 kg (185 lb 13.6 oz) (04/04 0419) Last BM Date: 09/23/17  Intake/Output from previous day: 04/03 0701 - 04/04 0700 In: 920 [P.O.:720; IV Piggyback:200] Out: 1100 [Urine:1100] Intake/Output this shift: No intake/output data recorded.  GI: tender ruq to palpation soft  Lab Results:  Recent Labs    09/24/17 0343 09/26/17 0614  WBC 13.2* 8.7  HGB 9.9* 9.7*  HCT 30.4* 28.5*  PLT 135* 147*   BMET Recent Labs    09/25/17 0403 09/26/17 0614  NA 140 137  K 4.0 4.0  CL 105 107  CO2 23 22  GLUCOSE 90 99  BUN 37* 35*  CREATININE 2.61* 2.17*  CALCIUM 8.6* 8.7*   PT/INR Recent Labs    09/23/17 1048  LABPROT 14.8  INR 1.17   ABG No results for input(s): PHART, HCO3 in the last 72 hours.  Invalid input(s): PCO2, PO2  Studies/Results: Dg Chest 2 View  Result Date: 09/25/2017 CLINICAL DATA:  Chest pain. EXAM: CHEST - 2 VIEW COMPARISON:  Radiograph of September 23, 2017. FINDINGS: Stable cardiomediastinal silhouette. No pneumothorax is noted. Mild bibasilar subsegmental atelectasis is noted with minimal bilateral pleural effusions. Bony thorax is unremarkable. IMPRESSION: Mild bibasilar subsegmental atelectasis with minimal bilateral pleural effusions. Electronically Signed   By: Marijo Conception, M.D.   On: 09/25/2017 16:30   Mr Abdomen Mrcp Wo Contrast  Result Date: 09/24/2017 CLINICAL DATA:  Inpatient. Epigastric/right upper quadrant abdominal pain. Abnormal liver function tests. Cholelithiasis. EXAM: MRI ABDOMEN WITHOUT CONTRAST  (INCLUDING MRCP) TECHNIQUE: Multiplanar multisequence MR imaging of the abdomen was performed. Heavily  T2-weighted images of the biliary and pancreatic ducts were obtained, and three-dimensional MRCP images were rendered by post processing. COMPARISON:  09/23/2017 unenhanced CT abdomen/pelvis and right upper quadrant abdominal sonogram. FINDINGS: Lower chest: Hypoventilatory changes in the dependent lung bases bilaterally. Hepatobiliary: Normal liver size and configuration. No hepatic steatosis. There is a sub 5 mm T2 hyperintense posterior right liver lobe lesion, incompletely characterized on this noncontrast scan, for which no follow-up is required unless the patient has risk factors for liver malignancy. No additional liver lesions. Distended gallbladder (5.3 cm diameter). Layering sludge and tiny 2-3 mm gallstones in the gallbladder. Borderline mild diffuse gallbladder wall thickening (gallbladder wall thickness 3 mm). No pericholecystic fluid. No biliary ductal dilatation. Common bile duct diameter 6 mm. Small amount of layering sludge in the common bile duct, with no filling defects in the common bile duct to suggest choledocholithiasis. Small periampullary duodenal diverticulum. Pancreas: There is a 0.8 cm cystic pancreatic body lesion (series 13001/image 52) without appreciable wall thickening or internal complexity. No additional pancreatic lesions. No pancreatic duct dilation. No convincing pancreas divisum (the main pancreatic duct is drained by an accessory duct of [place], however also appears to communicate with the common bile duct). Spleen: Normal size. No mass. Adrenals/Urinary Tract: No adrenal nodules. No hydronephrosis. Normal kidneys with no renal mass. Stomach/Bowel: Grossly normal stomach. Visualized small and large bowel is normal caliber, with no bowel wall thickening. Vascular/Lymphatic: Infrarenal 4.7 cm abdominal aortic aneurysm, stable. No pathologically enlarged lymph nodes in the abdomen. Other: Trace perihepatic ascites.  No focal fluid collection. Musculoskeletal: No aggressive  appearing focal osseous lesions. Bilateral posterior spinal fusion hardware is visualized in the lower lumbar spine. IMPRESSION: 1. Layering sludge and tiny gallstones in the distended gallbladder with borderline mild diffuse gallbladder wall thickening. No pericholecystic fluid. These findings are nonspecific and could be compatible with acute cholecystitis in the correct clinical setting. 2. No biliary ductal dilatation. CBD diameter 6 mm. Small amount of layering sludge in the CBD, with no evidence of choledocholithiasis. 3. 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 Follow-up MRI abdomen without and with IV contrast recommended in 2 years. This recommendation follows ACR consensus guidelines: Management of Incidental Pancreatic Cysts: A White Paper of the ACR Incidental Findings Committee. [place] 0109;32:355-732. 4. Infrarenal 4.7 cm abdominal aortic aneurysm. Recommend followup by abdomen and pelvis CTA in 6 months, and vascular surgery referral/consultation if not already obtained. This recommendation follows ACR consensus guidelines: White Paper of the ACR Incidental Findings Committee II on Vascular Findings. J Am Coll Radiol 2013; 10:789-794. 5. Trace perihepatic ascites. Electronically Signed   By: Ilona Sorrel M.D.   On: 09/24/2017 18:09   Mr 3d Recon At Scanner  Result Date: 09/24/2017 CLINICAL DATA:  Inpatient. Epigastric/right upper quadrant abdominal pain. Abnormal liver function tests. Cholelithiasis. EXAM: MRI ABDOMEN WITHOUT CONTRAST  (INCLUDING MRCP) TECHNIQUE: Multiplanar multisequence MR imaging of the abdomen was performed. Heavily T2-weighted images of the biliary and pancreatic ducts were obtained, and three-dimensional MRCP images were rendered by post processing. COMPARISON:  09/23/2017 unenhanced CT abdomen/pelvis and right upper quadrant abdominal sonogram. FINDINGS: Lower chest: Hypoventilatory changes in the dependent lung bases  bilaterally. Hepatobiliary: Normal liver size and configuration. No hepatic steatosis. There is a sub 5 mm T2 hyperintense posterior right liver lobe lesion, incompletely characterized on this noncontrast scan, for which no follow-up is required unless the patient has risk factors for liver malignancy. No additional liver lesions. Distended gallbladder (5.3 cm diameter). Layering sludge and tiny 2-3 mm gallstones in the gallbladder. Borderline mild diffuse gallbladder wall thickening (gallbladder wall thickness 3 mm). No pericholecystic fluid. No biliary ductal dilatation. Common bile duct diameter 6 mm. Small amount of layering sludge in the common bile duct, with no filling defects in the common bile duct to suggest choledocholithiasis. Small periampullary duodenal diverticulum. Pancreas: There is a 0.8 cm cystic pancreatic body lesion (series 13001/image 52) without appreciable wall thickening or internal complexity. No additional pancreatic lesions. No pancreatic duct dilation. No convincing pancreas divisum (the main pancreatic duct is drained by an accessory duct of [place], however also appears to communicate with the common bile duct). Spleen: Normal size. No mass. Adrenals/Urinary Tract: No adrenal nodules. No hydronephrosis. Normal kidneys with no renal mass. Stomach/Bowel: Grossly normal stomach. Visualized small and large bowel is normal caliber, with no bowel wall thickening. Vascular/Lymphatic: Infrarenal 4.7 cm abdominal aortic aneurysm, stable. No pathologically enlarged lymph nodes in the abdomen. Other: Trace perihepatic ascites.  No focal fluid collection. Musculoskeletal: No aggressive appearing focal osseous lesions. Bilateral posterior spinal fusion hardware is visualized in the lower lumbar spine. IMPRESSION: 1. Layering sludge and tiny gallstones in the distended gallbladder with borderline mild diffuse gallbladder wall thickening. No pericholecystic fluid. These findings are nonspecific  and could be compatible with acute cholecystitis in the correct clinical setting. 2. No biliary ductal dilatation. CBD diameter 6 mm. Small amount of layering sludge in the CBD, with no evidence of choledocholithiasis. 3. 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 Follow-up MRI abdomen without and with IV contrast recommended in 2 years. This recommendation follows ACR consensus guidelines: Management of Incidental Pancreatic Cysts: A White Paper of the ACR Incidental Findings Committee. [place] 0102;72:536-644. 4. Infrarenal 4.7 cm abdominal aortic aneurysm. Recommend followup by abdomen and pelvis CTA in 6 months, and vascular surgery referral/consultation if not already obtained. This recommendation follows ACR consensus guidelines: White Paper of the ACR Incidental Findings Committee II on Vascular Findings. J Am Coll Radiol 2013; 10:789-794. 5. Trace perihepatic ascites. Electronically Signed   By: Ilona Sorrel M.D.   On: 09/24/2017 18:09    Anti-infectives: Anti-infectives (From admission, onward)   Start     Dose/Rate Route Frequency Ordered Stop   09/24/17 1130  Ampicillin-Sulbactam (UNASYN) 3 g in sodium chloride 0.9 % 100 mL IVPB     3 g 200 mL/hr over 30 Minutes Intravenous Every 12 hours 09/24/17 1045     09/24/17 1045  meropenem (MERREM) 500 mg in sodium chloride 0.9 % 100 mL IVPB  Status:  Discontinued     500 mg 200 mL/hr over 30 Minutes Intravenous Every 12 hours 09/24/17 1036 09/24/17 1045      Assessment/Plan: Cholecystitis Lap chole today I discussed the procedure in detail.  We discussed the risks and benefits of a laparoscopic cholecystectomy and possible cholangiogram including, but not limited to bleeding, infection, injury to surrounding structures such as the intestine or liver, bile leak, retained gallstones, need to convert to an open procedure, prolonged diarrhea, blood clots such as  DVT, common bile duct injury,  anesthesia risks, and possible need for additional procedures.  The likelihood of improvement in symptoms and return to the patient's normal status is good. We discussed the typical post-operative recovery course. We also discussed subtotal cholecystectomy with drain placement.    Rolm Bookbinder 09/26/2017

## 2017-09-26 NOTE — Discharge Instructions (Addendum)
Please arrive at least 30 min before your appointment to complete your check in paperwork.  If you are unable to arrive 30 min prior to your appointment time we may have to cancel or reschedule you. ° °LAPAROSCOPIC SURGERY: POST OP INSTRUCTIONS  °1. DIET: Follow a light bland diet the first 24 hours after arrival home, such as soup, liquids, crackers, etc. Be sure to include lots of fluids daily. Avoid fast food or heavy meals as your are more likely to get nauseated. Eat a low fat the next few days after surgery.  °2. Take your usually prescribed home medications unless otherwise directed. °3. PAIN CONTROL:  °1. Pain is best controlled by a usual combination of three different methods TOGETHER:  °1. Ice/Heat °2. Over the counter pain medication °3. Prescription pain medication °2. Most patients will experience some swelling and bruising around the incisions. Ice packs or heating pads (30-60 minutes up to 6 times a day) will help. Use ice for the first few days to help decrease swelling and bruising, then switch to heat to help relax tight/sore spots and speed recovery. Some people prefer to use ice alone, heat alone, alternating between ice & heat. Experiment to what works for you. Swelling and bruising can take several weeks to resolve.  °3. It is helpful to take an over-the-counter pain medication regularly for the first few weeks. Choose one of the following that works best for you:  °1. Naproxen (Aleve, etc) Two 220mg tabs twice a day °2. Ibuprofen (Advil, etc) Three 200mg tabs four times a day (every meal & bedtime) °3. Acetaminophen (Tylenol, etc) 500-650mg four times a day (every meal & bedtime) °4. A prescription for pain medication (such as oxycodone, hydrocodone, etc) should be given to you upon discharge. Take your pain medication as prescribed.  °1. If you are having problems/concerns with the prescription medicine (does not control pain, nausea, vomiting, rash, itching, etc), please call us [phone]  [phone] to see if we need to switch you to a different pain medicine that will work better for you and/or control your side effect better. °2. If you need a refill on your pain medication, please contact your pharmacy. They will contact our office to request authorization. Prescriptions will not be filled after 5 pm or on week-ends. °4. Avoid getting constipated. Between the surgery and the pain medications, it is common to experience some constipation. Increasing fluid intake and taking a fiber supplement (such as Metamucil, Citrucel, FiberCon, MiraLax, etc) 1-2 times a day regularly will usually help prevent this problem from occurring. A mild laxative (prune juice, Milk of Magnesia, MiraLax, etc) should be taken according to package directions if there are no bowel movements after 48 hours.  °5. Watch out for diarrhea. If you have many loose bowel movements, simplify your diet to bland foods & liquids for a few days. Stop any stool softeners and decrease your fiber supplement. Switching to mild anti-diarrheal medications (Kayopectate, Pepto Bismol) can help. If this worsens or does not improve, please call us. °6. Wash / shower every day. You may shower over the dressings as they are waterproof. Continue to shower over incision(s) after the dressing is off. If there is glue over the incisions try not to pick it off, let it fall off naturally. °7. Remove your waterproof bandages 2 days after surgery. You may leave the incision open to air. You may replace a dressing/Band-Aid to cover the incision for comfort if you wish.  °8. ACTIVITIES as tolerated:  °  1. You may resume regular (light) daily activities beginning the next day--such as daily self-care, walking, climbing stairs--gradually increasing activities as tolerated. If you can walk 30 minutes without difficulty, it is safe to try more intense activity such as jogging, treadmill, bicycling, low-impact aerobics, swimming, etc. 2. Save the most intensive and  strenuous activity for last such as sit-ups, heavy lifting, contact sports, etc Refrain from any heavy lifting or straining until you are off narcotics for pain control. For the first 2-3 weeks do not lift over 10-15lb.  3. DO NOT PUSH THROUGH PAIN. Let pain be your guide: If it hurts to do something, don't do it. Pain is your body warning you to avoid that activity for another week until the pain goes down. 4. You may drive when you are no longer taking prescription pain medication, you can comfortably wear a seatbelt, and you can safely maneuver your car and apply brakes. 5. You may have sexual intercourse when it is comfortable.  9. FOLLOW UP in our office  1. Please call [facility] at [phone] to set up an appointment to see your surgeon in the office for a follow-up appointment approximately 2-3 weeks after your surgery. 2. Make sure that you call for this appointment the day you arrive home to insure a convenient appointment time.      10. IF YOU HAVE DISABILITY OR FAMILY LEAVE FORMS, BRING THEM TO THE               OFFICE FOR PROCESSING.       55.  Keep a written record of the drainage and bring that record to the office with you     WHEN TO CALL us [phone]:  1. Poor pain control 2. Reactions / problems with new medications (rash/itching, nausea, etc)  3. Fever over 101.5 F (38.5 C) 4. Inability to urinate 5. Nausea and/or vomiting 6. Worsening swelling or bruising 7. Continued bleeding from incision. 8. Increased pain, redness, or drainage from the incision  The clinic staff is available to answer your questions during regular business hours (8:30am-5pm). Please dont hesitate to call and ask to speak to one of our nurses for clinical concerns.  If you have a medical emergency, go to the nearest emergency room or call 911.  A surgeon from [facility] is always on call at the [facility], [address], [place],  [place], [place] [address] ?  MAIN: [phone] ? TOLL FREE: [phone] ?  FAX [phone]  [email or web address]

## 2017-09-26 NOTE — Op Note (Signed)
Preoperative diagnosis:acute cholecystitis, choledocholithiasis Postoperative diagnosis:same as above Procedure: Laparoscopic subtotal cholecystectomy Surgeon: Dr. Serita Grammes Asst:Brooke Meuth, PA-C Anesthesia: Gen. Specimens: gb to pathology Estimated blood DJME:268 cc Complications: None Drains: none Sponge count was correct at completion Disposition to recovery stable  Indications: This is a72 yom who has cholecystitis. He also appears to have been passing stones.  His bilirubin is decreasing and he remains with ruq pain. He has gallstones on [place].  I discussed going to the or today as he has been off plavix now. We discussed a laparoscopic cholecystectomy with risks associated with that.   Procedure: After informed consent was obtained the patient was taken to the operating room.He was already onantibiotics. SCDs were in place.He was placed undergeneral anesthesia without complication. Hisabdomen was prepped and draped in the standard sterile surgical fashion. A surgical timeout was then performed.  I infiltrated marcainebelow the umbilicus.  I then incised the fascia.  I placed a 0 vicryl pursestring suture. I then inserted a hasson trocar and I insufflated the abdomen to 15 mm Hg pressure. There was no entry injury.   An additional ruq, right mid abdomen and epigastric trocar were both placed under direct vision (5 mm).  the omentum was adherent to the gallbladder and I peeled this off. His gallbladder was dead and tense.  I aspirated it.  It was full of purulence.  I then looked down in the triangle.  This was very scarred in.  I attempted to dissect this after retracting it lateral and cephalad.  I did locate the cystic artery and this was bleeding. I clipped this several times.  It appeared that the gallbladder was adherent to the bile duct and there was no plane. I then elected to take the gallbladder in a dome down fashion.  It became clear that either liver was going to  be removed or I was leaving the backwall of the gallbladder in place. I elected to leave the backwall of the gallbladder in place.  I then removed the gallbladder down to where it was safe with the duct.  The gallbladder really just fell apart.  I did size the epigastric trocar up to a 12 mm trocar.  I then removed the portion of the gallbladder I removed with an ecosac.  There was really no way to close the remnant given the condition of the tissue. I irrigated copiously and obtained hemostasis. I placed a 19 Fr blake drain and several pieces of surgicel snow in the bed.  I secured the drain with a 2-0 nylon suture.  I then removed the hasson trocar, tied down the pursestringand closed this with another2-0 vicrylusing the endoclose device to close the trocar site and the hernia. I also removed the epigastric trocar and closed this with a 2-0 vicryl suture. I then removed the remaining trocars and these were closed with 4-0 Monocryl and glue. He tolerated this well be transferred to the recovery room. Will watch him in stepdown tonight.  Will also need to watch for bile drainage as he might need ercp

## 2017-09-26 NOTE — Transfer of Care (Signed)
Immediate Anesthesia Transfer of Care Note  Patient: Terry Garner  Procedure(s) Performed: LAPAROSCOPIC CHOLECYSTECTOMY (N/A Abdomen)  Patient Location: PACU  Anesthesia Type:General  Level of Consciousness: sedated and drowsy  Airway & Oxygen Therapy: Patient connected to face mask oxygen  Post-op Assessment: Post -op Vital signs reviewed and stable  Post vital signs: stable  Last Vitals:  Vitals Value Taken Time  BP 161/57 09/26/2017  1:35 PM  Temp    Pulse 65 09/26/2017  1:35 PM  Resp 22 09/26/2017  1:35 PM  SpO2 100 % 09/26/2017  1:35 PM  Vitals shown include unvalidated device data.  Last Pain:  Vitals:   09/26/17 1028  TempSrc: Oral  PainSc:          Complications: No apparent anesthesia complications

## 2017-09-26 NOTE — Anesthesia Preprocedure Evaluation (Addendum)
Anesthesia Evaluation  Patient identified by MRN, date of birth, ID band Patient awake    Reviewed: Allergy & Precautions, NPO status , Patient's Chart, lab work & pertinent test results  History of Anesthesia Complications Negative for: history of anesthetic complications  Airway Mallampati: II  TM Distance: >3 FB Neck ROM: Full    Dental  (+) Teeth Intact, Dental Advisory Given, Poor Dentition   Pulmonary neg pulmonary ROS,    breath sounds clear to auscultation       Cardiovascular hypertension, Pt. on medications + CAD, + Cardiac Stents and + Peripheral Vascular Disease (AAA: Infrarenal 4.7 cm abdominal aortic aneurysm,)   Rhythm:Regular Rate:Normal  09/24/17 ECHO: EF 55-60%, mild AS   Neuro/Psych negative neurological ROS     GI/Hepatic negative GI ROS, Acute cholecystitis   Endo/Other  negative endocrine ROS  Renal/GU Renal InsufficiencyRenal disease (creat 2.17)     Musculoskeletal  (+) Arthritis , Osteoarthritis,    Abdominal   Peds  Hematology  (+) Blood dyscrasia (Hb 9.7), anemia ,   Anesthesia Other Findings   Reproductive/Obstetrics                           Anesthesia Physical Anesthesia Plan  ASA: III  Anesthesia Plan: General   Post-op Pain Management:    Induction: Intravenous and Rapid sequence  PONV Risk Score and Plan: 3 and Ondansetron, Dexamethasone and Treatment may vary due to age or medical condition  Airway Management Planned: Oral ETT  Additional Equipment:   Intra-op Plan:   Post-operative Plan: Extubation in OR  Informed Consent: I have reviewed the patients History and Physical, chart, labs and discussed the procedure including the risks, benefits and alternatives for the proposed anesthesia with the patient or authorized representative who has indicated his/her understanding and acceptance.   Dental advisory given  Plan Discussed with: CRNA and  Surgeon  Anesthesia Plan Comments: (Plan routine monitors, GETA )        Anesthesia Quick Evaluation

## 2017-09-26 NOTE — Progress Notes (Signed)
Report given to Leann in short stay.

## 2017-09-26 NOTE — Progress Notes (Signed)
Patient ID: Terry Garner, male   DOB: Jan 28, 1941, 77 y.o.   MRN: [id number]  PROGRESS NOTE    NARA HASSO  GEX:[id number] DOB: [birth year] DOA: 09/23/2017 PCP: Lucky Cowboy, MD   Outpatient Specialists: [facility]  Brief Narrative: DUB SOLTANI is a 77 y.o. male with medical history significant of AAA, chronic kidney disease, coronary artery disease comes in with about 2 days of worsening epigastric pain and right upper quadrant abdominal pain.  Patient has been vomiting and very nauseous with it.  He denies any fevers.  He denies any diarrhea.  He denies any association with food.  He still has his gallbladder.  Patient's feeling better after some IV morphine given in the ED.  Patient referred for admission for possible acute cholecystitis. Has been evaluated for surgery and needs medical clearance.  Assessment & Plan:   Principal Problem:   Choledocholithiasis with acute cholecystitis Active Problems:   Carotid artery disease (HCC)   Right bundle branch block   Essential hypertension   Abdominal aortic aneurysm (HCC)   Preoperative cardiovascular examination   CKD (chronic kidney disease) stage 4, GFR 15-29 ml/min (HCC)   Abdominal pain   #1. Cholidocholithiasis with Acute Cholecystitis: Patient on empiric antibiotics. Holding Plavix and ASA. Surgery and GI consulted and cardiology has cleared patient for surgery. Patient to have Lap Chole today.   #2. AAA: stable.  #3 HTN: Controlled  #4 Carotid Artery disease: Stable. No symptoms now  #5 RBBB: Will need 2D echo. Monitor with telemetry.  #6 Hypokalemia: repleted  #7 CKD stage IV: Stable BUN/Creatinine  #8 Hypomagnesemia: magnesium repleted. Recheck level  DVT prophylaxis: SCDs  Code Status:  Full  Family Communication: None   Disposition Plan: Home  Consultants:   Dr Dwain Sarna, General Surgery  [facility], Dr Ramon Dredge  Cardiology, Dr Rennis Golden  Procedures:  CT abdomen MRCP  Antimicrobials:   -Meropenem  Subjective: Patient still has no complaint today. He is NPO waiting for surgery today.  Objective: Vitals:   09/25/17 0411 09/25/17 1500 09/25/17 2134 09/26/17 0419  BP: (!) 148/61 (!) 161/62 (!) 168/66 (!) 154/66  Pulse: 62 61 63 62  Resp: 18   18  Temp: 98.6 F (37 C) 97.6 F (36.4 C) 99.3 F (37.4 C) 98 F (36.7 C)  TempSrc: Oral Oral Oral Oral  SpO2: 93% 94% 94% 92%  Weight:    84.3 kg (185 lb 13.6 oz)  Height:        Intake/Output Summary (Last 24 hours) at 09/26/2017 0833 Last data filed at 09/26/2017 0600 Gross per 24 hour  Intake 920 ml  Output 1100 ml  Net -180 ml   Filed Weights   09/23/17 1049 09/23/17 2259 09/26/17 0419  Weight: 77.1 kg (170 lb) 79.2 kg (174 lb 9.7 oz) 84.3 kg (185 lb 13.6 oz)    Examination:  General exam: Appears calm and comfortable  Respiratory system: Clear to auscultation. Respiratory effort normal. Cardiovascular system: S1 & S2 heard, RRR. No JVD, murmurs, rubs, gallops or clicks. No pedal edema. Gastrointestinal system: Abdomen is nondistended, soft mildly tender. No organomegaly or masses felt. Normal bowel sounds heard. Central nervous system: Alert and oriented. No focal neurological deficits. Extremities: Symmetric 5 x 5 power. Skin: No rashes, lesions or ulcers Psychiatry: Judgement and insight appear normal. Mood & affect appropriate.     Data Reviewed: I have personally reviewed following labs and imaging studies  CBC: Recent Labs  Lab 09/23/17 1048 09/24/17 0343 09/26/17 0102  WBC 9.7 13.2* 8.7  HGB 10.7* 9.9* 9.7*  HCT 31.6* 30.4* 28.5*  MCV 92.1 91.3 91.9  PLT 199 135* 147*   Basic Metabolic Panel: Recent Labs  Lab 09/23/17 1048 09/23/17 1918 09/24/17 0343 09/25/17 0403 09/26/17 0614  NA 140  --  138 140 137  K 3.1*  --  4.2 4.0 4.0  CL 103  --  101 105 107  CO2 26  --  24 23 22   GLUCOSE 145*  --  108* 90 99  BUN 27*  --  28* 37* 35*  CREATININE 2.59*  --  2.41* 2.61* 2.17*  CALCIUM  9.3  --  9.0 8.6* 8.7*  MG  --  1.6*  --   --   --    GFR: Estimated Creatinine Clearance: 33.5 mL/min (A) (by C-G formula based on SCr of 2.17 mg/dL (H)). Liver Function Tests: Recent Labs  Lab 09/23/17 1130 09/24/17 0343 09/25/17 0403 09/26/17 0614  AST 163* 155* 116* 82*  ALT 53 70* 63 54  ALKPHOS 66 61 67 94  BILITOT 3.4* 4.3* 4.3* 3.5*  PROT 5.9* 5.1* 4.4* 4.4*  ALBUMIN 3.5 2.7* 2.3* 2.2*   Recent Labs  Lab 09/23/17 1130 09/24/17 0343  LIPASE 28 17   No results for input(s): AMMONIA in the last 168 hours. Coagulation Profile: Recent Labs  Lab 09/23/17 1048  INR 1.17   Cardiac Enzymes: No results for input(s): CKTOTAL, CKMB, CKMBINDEX, TROPONINI in the last 168 hours. BNP (last 3 results) No results for input(s): PROBNP in the last 8760 hours. HbA1C: No results for input(s): HGBA1C in the last 72 hours. CBG: No results for input(s): GLUCAP in the last 168 hours. Lipid Profile: No results for input(s): CHOL, HDL, LDLCALC, TRIG, CHOLHDL, LDLDIRECT in the last 72 hours. Thyroid Function Tests: No results for input(s): TSH, T4TOTAL, FREET4, T3FREE, THYROIDAB in the last 72 hours. Anemia Panel: No results for input(s): VITAMINB12, FOLATE, FERRITIN, TIBC, IRON, RETICCTPCT in the last 72 hours. Urine analysis:    Component Value Date/Time   COLORURINE YELLOW 12/20/2016 0924   APPEARANCEUR CLEAR 12/20/2016 0924   LABSPEC 1.013 12/20/2016 0924   PHURINE 7.0 12/20/2016 0924   GLUCOSEU NEGATIVE 12/20/2016 0924   HGBUR NEGATIVE 12/20/2016 0924   BILIRUBINUR NEGATIVE 12/20/2016 0924   KETONESUR NEGATIVE 12/20/2016 0924   PROTEINUR NEGATIVE 12/20/2016 0924   UROBILINOGEN 0.2 11/15/2014 2052   NITRITE NEGATIVE 12/20/2016 0924   LEUKOCYTESUR NEGATIVE 12/20/2016 0924   Sepsis Labs: @LABRCNTIP (procalcitonin:4,lacticidven:4)  ) Recent Results (from the past 240 hour(s))  Surgical pcr screen     Status: None   Collection Time: 09/25/17  8:45 PM  Result Value Ref  Range Status   MRSA, PCR NEGATIVE NEGATIVE Final   Staphylococcus aureus NEGATIVE NEGATIVE Final    Comment: (NOTE) The Xpert SA Assay (FDA approved for NASAL specimens in patients 40 years of age and older), is one component of a comprehensive surveillance program. It is not intended to diagnose infection nor to guide or monitor treatment. Performed at [facility], [address] [place], [place] [address]          Radiology Studies: Dg Chest 2 View  Result Date: 09/25/2017 CLINICAL DATA:  Chest pain. EXAM: CHEST - 2 VIEW COMPARISON:  Radiograph of September 23, 2017. FINDINGS: Stable cardiomediastinal silhouette. No pneumothorax is noted. Mild bibasilar subsegmental atelectasis is noted with minimal bilateral pleural effusions. Bony thorax is unremarkable. IMPRESSION: Mild bibasilar subsegmental atelectasis with minimal bilateral pleural effusions.  Electronically Signed   By: Lupita Raider, M.D.   On: 09/25/2017 16:30   Mr Abdomen Mrcp Wo Contrast  Result Date: 09/24/2017 CLINICAL DATA:  Inpatient. Epigastric/right upper quadrant abdominal pain. Abnormal liver function tests. Cholelithiasis. EXAM: MRI ABDOMEN WITHOUT CONTRAST  (INCLUDING MRCP) TECHNIQUE: Multiplanar multisequence MR imaging of the abdomen was performed. Heavily T2-weighted images of the biliary and pancreatic ducts were obtained, and three-dimensional MRCP images were rendered by post processing. COMPARISON:  09/23/2017 unenhanced CT abdomen/pelvis and right upper quadrant abdominal sonogram. FINDINGS: Lower chest: Hypoventilatory changes in the dependent lung bases bilaterally. Hepatobiliary: Normal liver size and configuration. No hepatic steatosis. There is a sub 5 mm T2 hyperintense posterior right liver lobe lesion, incompletely characterized on this noncontrast scan, for which no follow-up is required unless the patient has risk factors for liver malignancy. No additional liver lesions. Distended gallbladder (5.3  cm diameter). Layering sludge and tiny 2-3 mm gallstones in the gallbladder. Borderline mild diffuse gallbladder wall thickening (gallbladder wall thickness 3 mm). No pericholecystic fluid. No biliary ductal dilatation. Common bile duct diameter 6 mm. Small amount of layering sludge in the common bile duct, with no filling defects in the common bile duct to suggest choledocholithiasis. Small periampullary duodenal diverticulum. Pancreas: There is a 0.8 cm cystic pancreatic body lesion (series 13001/image 52) without appreciable wall thickening or internal complexity. No additional pancreatic lesions. No pancreatic duct dilation. No convincing pancreas divisum (the main pancreatic duct is drained by an accessory duct of [place], however also appears to communicate with the common bile duct). Spleen: Normal size. No mass. Adrenals/Urinary Tract: No adrenal nodules. No hydronephrosis. Normal kidneys with no renal mass. Stomach/Bowel: Grossly normal stomach. Visualized small and large bowel is normal caliber, with no bowel wall thickening. Vascular/Lymphatic: Infrarenal 4.7 cm abdominal aortic aneurysm, stable. No pathologically enlarged lymph nodes in the abdomen. Other: Trace perihepatic ascites.  No focal fluid collection. Musculoskeletal: No aggressive appearing focal osseous lesions. Bilateral posterior spinal fusion hardware is visualized in the lower lumbar spine. IMPRESSION: 1. Layering sludge and tiny gallstones in the distended gallbladder with borderline mild diffuse gallbladder wall thickening. No pericholecystic fluid. These findings are nonspecific and could be compatible with acute cholecystitis in the correct clinical setting. 2. No biliary ductal dilatation. CBD diameter 6 mm. Small amount of layering sludge in the CBD, with no evidence of choledocholithiasis. 3. 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 Follow-up MRI abdomen without and with IV contrast  recommended in 2 years. This recommendation follows ACR consensus guidelines: Management of Incidental Pancreatic Cysts: A White Paper of the ACR Incidental Findings Committee. J Am Coll Radiol 2017;14:911-923. 4. Infrarenal 4.7 cm abdominal aortic aneurysm. Recommend followup by abdomen and pelvis CTA in 6 months, and vascular surgery referral/consultation if not already obtained. This recommendation follows ACR consensus guidelines: White Paper of the ACR Incidental Findings Committee II on Vascular Findings. J Am Coll Radiol 2013; 10:789-794. 5. Trace perihepatic ascites. Electronically Signed   By: Delbert Phenix M.D.   On: 09/24/2017 18:09   Mr 3d Recon At Scanner  Result Date: 09/24/2017 CLINICAL DATA:  Inpatient. Epigastric/right upper quadrant abdominal pain. Abnormal liver function tests. Cholelithiasis. EXAM: MRI ABDOMEN WITHOUT CONTRAST  (INCLUDING MRCP) TECHNIQUE: Multiplanar multisequence MR imaging of the abdomen was performed. Heavily T2-weighted images of the biliary and pancreatic ducts were obtained, and three-dimensional MRCP images were rendered by post processing. COMPARISON:  09/23/2017 unenhanced CT abdomen/pelvis and right upper quadrant abdominal sonogram. FINDINGS:  Lower chest: Hypoventilatory changes in the dependent lung bases bilaterally. Hepatobiliary: Normal liver size and configuration. No hepatic steatosis. There is a sub 5 mm T2 hyperintense posterior right liver lobe lesion, incompletely characterized on this noncontrast scan, for which no follow-up is required unless the patient has risk factors for liver malignancy. No additional liver lesions. Distended gallbladder (5.3 cm diameter). Layering sludge and tiny 2-3 mm gallstones in the gallbladder. Borderline mild diffuse gallbladder wall thickening (gallbladder wall thickness 3 mm). No pericholecystic fluid. No biliary ductal dilatation. Common bile duct diameter 6 mm. Small amount of layering sludge in the common bile duct, with  no filling defects in the common bile duct to suggest choledocholithiasis. Small periampullary duodenal diverticulum. Pancreas: There is a 0.8 cm cystic pancreatic body lesion (series 13001/image 52) without appreciable wall thickening or internal complexity. No additional pancreatic lesions. No pancreatic duct dilation. No convincing pancreas divisum (the main pancreatic duct is drained by an accessory duct of [place], however also appears to communicate with the common bile duct). Spleen: Normal size. No mass. Adrenals/Urinary Tract: No adrenal nodules. No hydronephrosis. Normal kidneys with no renal mass. Stomach/Bowel: Grossly normal stomach. Visualized small and large bowel is normal caliber, with no bowel wall thickening. Vascular/Lymphatic: Infrarenal 4.7 cm abdominal aortic aneurysm, stable. No pathologically enlarged lymph nodes in the abdomen. Other: Trace perihepatic ascites.  No focal fluid collection. Musculoskeletal: No aggressive appearing focal osseous lesions. Bilateral posterior spinal fusion hardware is visualized in the lower lumbar spine. IMPRESSION: 1. Layering sludge and tiny gallstones in the distended gallbladder with borderline mild diffuse gallbladder wall thickening. No pericholecystic fluid. These findings are nonspecific and could be compatible with acute cholecystitis in the correct clinical setting. 2. No biliary ductal dilatation. CBD diameter 6 mm. Small amount of layering sludge in the CBD, with no evidence of choledocholithiasis. 3. 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 Follow-up MRI abdomen without and with IV contrast recommended in 2 years. This recommendation follows ACR consensus guidelines: Management of Incidental Pancreatic Cysts: A White Paper of the ACR Incidental Findings Committee. J Am Coll Radiol 2017;14:911-923. 4. Infrarenal 4.7 cm abdominal aortic aneurysm. Recommend followup by abdomen and pelvis CTA in 6  months, and vascular surgery referral/consultation if not already obtained. This recommendation follows ACR consensus guidelines: White Paper of the ACR Incidental Findings Committee II on Vascular Findings. J Am Coll Radiol 2013; 10:789-794. 5. Trace perihepatic ascites. Electronically Signed   By: Delbert Phenix M.D.   On: 09/24/2017 18:09        Scheduled Meds: . allopurinol  300 mg Oral Daily  . amLODipine  5 mg Oral Daily  . aspirin EC  81 mg Oral Daily  . brimonidine  1 drop Both Eyes BID  . chlorhexidine  15 mL Mouth Rinse BID  . cholecalciferol  5,000 Units Oral Daily  . dorzolamide-timolol  1 drop Both Eyes BID  . loteprednol  1 drop Right Eye BID  . mouth rinse  15 mL Mouth Rinse q12n4p  . nepafenac  1 drop Right Eye Daily   Continuous Infusions: . ampicillin-sulbactam (UNASYN) IV Stopped (09/25/17 2330)     LOS: 2 days    Time spent: 33 minutes    Jylian Pappalardo,LAWAL, MD [facility] Pager [phone]  If 7PM-7AM, please contact night-coverage [email or web address] Password [facility] 09/26/2017, 8:33 AM

## 2017-09-26 NOTE — Anesthesia Procedure Notes (Signed)
Procedure Name: Intubation Date/Time: 09/26/2017 11:40 AM Performed by: Lavell Luster, CRNA Pre-anesthesia Checklist: Patient identified, Emergency Drugs available, Suction available, Patient being monitored and Timeout performed Patient Re-evaluated:Patient Re-evaluated prior to induction Oxygen Delivery Method: Circle system utilized Preoxygenation: Pre-oxygenation with 100% oxygen Induction Type: IV induction Ventilation: Mask ventilation without difficulty Laryngoscope Size: Mac and 4 Grade View: Grade II Tube type: Oral Number of attempts: 1 Airway Equipment and Method: Stylet Placement Confirmation: ETT inserted through vocal cords under direct vision,  positive ETCO2 and breath sounds checked- equal and bilateral Secured at: 21 cm Tube secured with: Tape Dental Injury: Teeth and Oropharynx as per pre-operative assessment

## 2017-09-27 ENCOUNTER — Encounter (HOSPITAL_COMMUNITY): Payer: Self-pay | Admitting: General Surgery

## 2017-09-27 LAB — CBC
HEMATOCRIT: 24.3 % — AB (ref 39.0–52.0)
Hemoglobin: 7.9 g/dL — ABNORMAL LOW (ref 13.0–17.0)
MCH: 29.3 pg (ref 26.0–34.0)
MCHC: 32.5 g/dL (ref 30.0–36.0)
MCV: 90 fL (ref 78.0–100.0)
Platelets: 144 10*3/uL — ABNORMAL LOW (ref 150–400)
RBC: 2.7 MIL/uL — AB (ref 4.22–5.81)
RDW: 15.1 % (ref 11.5–15.5)
WBC: 7.1 10*3/uL (ref 4.0–10.5)

## 2017-09-27 LAB — COMPREHENSIVE METABOLIC PANEL
ALT: 37 U/L (ref 17–63)
AST: 49 U/L — AB (ref 15–41)
Albumin: 2 g/dL — ABNORMAL LOW (ref 3.5–5.0)
Alkaline Phosphatase: 66 U/L (ref 38–126)
Anion gap: 10 (ref 5–15)
BILIRUBIN TOTAL: 2.2 mg/dL — AB (ref 0.3–1.2)
BUN: 36 mg/dL — AB (ref 6–20)
CO2: 21 mmol/L — ABNORMAL LOW (ref 22–32)
CREATININE: 2.16 mg/dL — AB (ref 0.61–1.24)
Calcium: 8 mg/dL — ABNORMAL LOW (ref 8.9–10.3)
Chloride: 108 mmol/L (ref 101–111)
GFR calc Af Amer: 32 mL/min — ABNORMAL LOW (ref >60)
GFR, EST NON AFRICAN AMERICAN: 28 mL/min — AB (ref >60)
Glucose, Bld: 144 mg/dL — ABNORMAL HIGH (ref 65–99)
Potassium: 4.3 mmol/L (ref 3.5–5.1)
Sodium: 139 mmol/L (ref 135–145)
TOTAL PROTEIN: 4.3 g/dL — AB (ref 6.5–8.1)

## 2017-09-27 LAB — PREPARE RBC (CROSSMATCH)

## 2017-09-27 MED ORDER — BOOST / RESOURCE BREEZE PO LIQD CUSTOM
1.0000 | Freq: Two times a day (BID) | ORAL | Status: DC
  Administered 2017-09-27 – 2017-10-01 (×7): 1 via ORAL

## 2017-09-27 MED ORDER — OXYCODONE HCL 5 MG PO TABS: 2.5000 mg | ORAL_TABLET | ORAL | Status: DC | PRN

## 2017-09-27 MED ORDER — SODIUM CHLORIDE 0.9 % IV SOLN
Freq: Once | INTRAVENOUS | Status: AC
  Administered 2017-09-27: 11:00:00 via INTRAVENOUS

## 2017-09-27 MED ORDER — MORPHINE SULFATE (PF) 4 MG/ML IV SOLN: 0.5000 mg | INTRAVENOUS | Status: DC | PRN

## 2017-09-27 NOTE — Progress Notes (Addendum)
[facility] Progress Note  1 Day Post-Op  Subjective: CC-  Wife at bedside. Overall doing well. Patient denies abdominal pain this morning. Denies n/v. He is tolerating clear liquids.  Wife states that the morphine has made him a little confused and he is seeing things in the room that are not there.  Objective: Vital signs in last 24 hours: Temp:  [97.4 F (36.3 C)-98.4 F (36.9 C)] 98.4 F (36.9 C) (04/05 0445) Pulse Rate:  [54-73] 54 (04/05 0445) Resp:  [14-21] 16 (04/05 0445) BP: (137-169)/(54-67) 138/55 (04/05 0445) SpO2:  [94 %-100 %] 94 % (04/05 0445) Weight:  [185 lb (83.9 kg)-185 lb 10 oz (84.2 kg)] 185 lb 10 oz (84.2 kg) (04/05 0500) Last BM Date: 09/23/17  Intake/Output from previous day: 04/04 0701 - 04/05 0700 In: 2260 [P.O.:1060; I.V.:700; IV Piggyback:500] Out: 850 [Drains:450; Blood:400] Intake/Output this shift: No intake/output data recorded.  PE: Gen:  Alert, NAD, pleasant HEENT: EOM's intact, pupils equal and round Card:  RRR Pulm:  CTAB, no W/R/R, effort normal Abd: Soft, ND, nontender, +BS, lap incisions cdi with steris in place, JP drain with serosanguinous fluid in bulb Ext:  1+ pitting edema BLE  Lab Results:  Recent Labs    09/26/17 0614 09/26/17 1313 09/26/17 1635  WBC 8.7  --  9.3  HGB 9.7* 9.5* 9.5*  HCT 28.5* 28.0* 28.4*  PLT 147*  --  143*   BMET Recent Labs    09/25/17 0403 09/26/17 0614 09/26/17 1313  NA 140 137 142  K 4.0 4.0 4.2  CL 105 107  --   CO2 23 22  --   GLUCOSE 90 99 108*  BUN 37* 35*  --   CREATININE 2.61* 2.17*  --   CALCIUM 8.6* 8.7*  --    PT/INR No results for input(s): LABPROT, INR in the last 72 hours. CMP     Component Value Date/Time   NA 142 09/26/2017 1313   K 4.2 09/26/2017 1313   CL 107 09/26/2017 0614   CO2 22 09/26/2017 0614   GLUCOSE 108 (H) 09/26/2017 1313   BUN 35 (H) 09/26/2017 0614   CREATININE 2.17 (H) 09/26/2017 0614   CREATININE 2.80 (H) 07/15/2017 1405   CALCIUM 8.7 (L) 09/26/2017 0614   PROT 4.4 (L) 09/26/2017 0614   ALBUMIN 2.2 (L) 09/26/2017 0614   AST 82 (H) 09/26/2017 0614   ALT 54 09/26/2017 0614   ALKPHOS 94 09/26/2017 0614   BILITOT 3.5 (H) 09/26/2017 0614   GFRNONAA 27 (L) 09/26/2017 0614   GFRNONAA 21 (L) 07/15/2017 1405   GFRAA 32 (L) 09/26/2017 0614   GFRAA 24 (L) 07/15/2017 1405   Lipase     Component Value Date/Time   LIPASE 17 09/24/2017 0343       Studies/Results: Dg Chest 2 View  Result Date: 09/25/2017 CLINICAL DATA:  Chest pain. EXAM: CHEST - 2 VIEW COMPARISON:  Radiograph of September 23, 2017. FINDINGS: Stable cardiomediastinal silhouette. No pneumothorax is noted. Mild bibasilar subsegmental atelectasis is noted with minimal bilateral pleural effusions. Bony thorax is unremarkable. IMPRESSION: Mild bibasilar subsegmental atelectasis with minimal bilateral pleural effusions. Electronically Signed   By: Marijo Conception, M.D.   On: 09/25/2017 16:30    Anti-infectives: Anti-infectives (From admission, onward)   Start     Dose/Rate Route Frequency Ordered Stop   09/26/17 2000  Ampicillin-Sulbactam (UNASYN) 3 g in sodium chloride 0.9 % 100 mL IVPB  Status:  Discontinued     3 g  200 mL/hr over 30 Minutes Intravenous Every 8 hours 09/26/17 1532 09/27/17 0738   09/26/17 1930  piperacillin-tazobactam (ZOSYN) IVPB 3.375 g     3.375 g 12.5 mL/hr over 240 Minutes Intravenous Every 8 hours 09/26/17 1553     09/24/17 1130  Ampicillin-Sulbactam (UNASYN) 3 g in sodium chloride 0.9 % 100 mL IVPB  Status:  Discontinued     3 g 200 mL/hr over 30 Minutes Intravenous Every 12 hours 09/24/17 1045 09/26/17 1532   09/24/17 1045  meropenem (MERREM) 500 mg in sodium chloride 0.9 % 100 mL IVPB  Status:  Discontinued     500 mg 200 mL/hr over 30 Minutes Intravenous Every 12 hours 09/24/17 1036 09/24/17 1045       Assessment/Plan AAA HTN CAD s/p LAD and diagonal branch stenting back in 2006, 7 and 8 - holding plavix Carotid artery  disease Chronic RBBB CKD-IV HLD R rib pain   Acute cholecystitis, ?choledocholithiasis Laparoscopic subtotal cholecystectomy 4/4 Dr. [name] - POD 1 - unable to perform IOC but LFTs including bilirubin trending down - drain with 450cc output, serosanguinous - getting 2 uPRBC today, hemoglobin 7.9  ID - zosyn 4/4>>, unasyn 4/2>>4/4 FEN - IVF, full liquids VTE - SCDs Foley - none  Plan - Advance to full liquids and add Boost. Continue JP drain and zosyn; monitor drain output. Encourage OOB to chair today, consult PT. Limit narcotic use. Continue to hold plavix. Getting 2 uPRBC per primary.   LOS: 3 days    Wellington Hampshire , [facility] 09/27/2017, 7:36 AM Pager: [phone] Consults: [phone] Mon-Fri 7:00 am-4:30 pm Sat-Sun 7:00 am-11:30 am

## 2017-09-27 NOTE — Evaluation (Signed)
Physical Therapy Evaluation Patient Details Name: Terry Garner MRN: [id number] DOB: Sep 16, 1940 Today's Date: 09/27/2017   History of Present Illness  77yo male with complaints of epigastric pain, RUQ pain, and nausea/vomiting. He received laparoscopic cholecystectomy on 09/26/17. PMH AAA, cataracts/low vision, CKD, glaucoma, gout, HTN, lumbar and cervical surgeries, hernia repair, shoulder arthroscopy, THA   Clinical Impression   Patient received up in chair today, pleasant and willing to participate in PT today. He seems a bit confused, wife reports he seems to be a bit confused from the morphine and she has already spoken to the PA about this, he is A&Ox4 however. Able to perform functional transfers and gait in hallway approximately 247ft with Min guard and gait impairments as noted below, however he and his wife report that he is generally at his baseline level of mobility. He will continue to benefit from skilled PT services in the acute setting, but does not appear to be in need of skilled PT services following discharge from this facility.     Follow Up Recommendations No PT follow up    Equipment Recommendations  None recommended by PT    Recommendations for Other Services       Precautions / Restrictions Precautions Precautions: Other (comment) Precaution Comments: reduced vision, abdominal drain Restrictions Weight Bearing Restrictions: No      Mobility  Bed Mobility               General bed mobility comments: DNT, received up in chair   Transfers Overall transfer level: Needs assistance Equipment used: None Transfers: Sit to/from Stand Sit to Stand: Min guard         General transfer comment: Min guard for safety, cues to take a minute to steady after initially standing   Ambulation/Gait Ambulation/Gait assistance: Min guard Ambulation Distance (Feet): 250 Feet Assistive device: None Gait Pattern/deviations: Step-through pattern;Decreased step length -  right;Decreased step length - left;Decreased stride length;Decreased dorsiflexion - right;Decreased dorsiflexion - left;Trunk flexed     General Gait Details: wife reports that he is generally at his baseline mobility status; patient very steady once on his feet   Stairs            Wheelchair Mobility    Modified Rankin (Stroke Patients Only)       Balance Overall balance assessment: No apparent balance deficits (not formally assessed)                                           Pertinent Vitals/Pain Pain Assessment: No/denies pain    Home Living Family/patient expects to be discharged to:: Private residence Living Arrangements: Spouse/significant other Available Help at Discharge: Family;Available 24 hours/day Type of Home: House Home Access: Level entry     Home Layout: One level Home Equipment: Bedside commode;Shower seat;Grab bars - tub/shower;Grab bars - toilet      Prior Function Level of Independence: Independent         Comments: doing well but does have a remote history of 2 falls when outside      Hand Dominance        Extremity/Trunk Assessment   Upper Extremity Assessment Upper Extremity Assessment: Defer to OT evaluation    Lower Extremity Assessment Lower Extremity Assessment: Overall WFL for tasks assessed    Cervical / Trunk Assessment Cervical / Trunk Assessment: Kyphotic  Communication   Communication: HOH  Cognition  Arousal/Alertness: Awake/alert Behavior During Therapy: WFL for tasks assessed/performed Overall Cognitive Status: Within Functional Limits for tasks assessed                                 General Comments: mildly suspect due to medications, wife reports PA-C is aware that morphine is making patient somewhat confused; he is A&Ox4 however and very pleasant       General Comments      Exercises     Assessment/Plan    PT Assessment Patient needs continued PT services  PT  Problem List Decreased mobility;Decreased coordination       PT Treatment Interventions DME instruction;Therapeutic activities;Gait training;Therapeutic exercise;Patient/family education;Stair training;Balance training;Functional mobility training;Neuromuscular re-education;Manual techniques    PT Goals (Current goals can be found in the Care Plan section)  Acute Rehab PT Goals Patient Stated Goal: to go home  PT Goal Formulation: With patient/family Time For Goal Achievement: 10/11/17 Potential to Achieve Goals: Good    Frequency Min 3X/week   Barriers to discharge        Co-evaluation               AM-PAC PT "6 Clicks" Daily Activity  Outcome Measure Difficulty turning over in bed (including adjusting bedclothes, sheets and blankets)?: None Difficulty moving from lying on back to sitting on the side of the bed? : None Difficulty sitting down on and standing up from a chair with arms (e.g., wheelchair, bedside commode, etc,.)?: None Help needed moving to and from a bed to chair (including a wheelchair)?: None Help needed walking in hospital room?: None Help needed climbing 3-5 steps with a railing? : A Little 6 Click Score: 23    End of Session Equipment Utilized During Treatment: Gait belt Activity Tolerance: Patient tolerated treatment well Patient left: in chair;with call bell/phone within reach;with family/visitor present   PT Visit Diagnosis: Muscle weakness (generalized) (M62.81);History of falling (Z91.81)    Time: 0131-4388 PT Time Calculation (min) (ACUTE ONLY): 16 min   Charges:   PT Evaluation $PT Eval Low Complexity: 1 Low     PT G Codes:        Deniece Ree PT, DPT, CBIS  Supplemental Physical Therapist [place]   Pager [phone]

## 2017-09-27 NOTE — Care Management Important Message (Signed)
Important Message  [name] MRN: [id number] Date of Birth: 02/01/1941   Medicare Important Message Given:  Yes    [name] 09/27/2017, 2:07 PM

## 2017-09-27 NOTE — Progress Notes (Signed)
Patient ID: Terry Garner, male   DOB: [birth year], 77 y.o.   MRN: [id number]  PROGRESS NOTE    Terry Garner  PJA:[id number] DOB: 15-Jun-1941 DOA: 09/23/2017 PCP: [name], MD   Outpatient Specialists: [facility]  Brief Narrative: Terry Garner is a 77 y.o. male with medical history significant of AAA, chronic kidney disease, coronary artery disease comes in with about 2 days of worsening epigastric pain and right upper quadrant abdominal pain.  Patient has been vomiting and very nauseous with it.  He denies any fevers.  He denies any diarrhea.  He denies any association with food.  He still has his gallbladder.  Patient's feeling better after some IV morphine given in the ED.  Patient referred for admission for possible acute cholecystitis. He is POD #1 S/P Lap chole.  Assessment & Plan:   Principal Problem:   Choledocholithiasis with acute cholecystitis Active Problems:   Carotid artery disease (HCC)   Right bundle branch block   Essential hypertension   Abdominal aortic aneurysm (HCC)   Preoperative cardiovascular examination   CKD (chronic kidney disease) stage 4, GFR 15-29 ml/min (HCC)   Abdominal pain   #1. Cholidocholithiasis with Acute Cholecystitis: POD #1 S/P Lap Chole. Doing better except for Post-operative anemia. Will transfuse and monitor  #2. Post-Op anemia: Hb has dropped from 9.5 to 7.9 Postop. Will transfuse 2 units of PRBC and monitor H/H.  #3 HTN: Controlled  #4 Carotid Artery disease: Stable. No symptoms now  #5 RBBB: Will need 2D echo. Monitor with telemetry.  #6 Hypokalemia: repleted  #7 CKD stage IV: Stable BUN/Creatinine  #8 Hypomagnesemia: magnesium repleted. Recheck level  #9 AAA: stable.  DVT prophylaxis: SCDs  Code Status:  Full  Family Communication: None   Disposition Plan: Home  Consultants:   Dr [name], General Surgery  [facility], Dr Percell Miller  Cardiology, Dr Debara Pickett  Procedures:  CT abdomen MRCP  Antimicrobials:    -Meropenem  Subjective: Patient still has no complaint today. He is weak but no pain.  Objective: Vitals:   09/26/17 2054 09/27/17 0132 09/27/17 0445 09/27/17 0500  BP: (!) 152/64 (!) 137/59 (!) 138/55   Pulse: 73 (!) 55 (!) 54   Resp: 16 16 16    Temp: 97.6 F (36.4 C) (!) 97.4 F (36.3 C) 98.4 F (36.9 C)   TempSrc: Oral Oral Oral   SpO2: 95% 95% 94%   Weight:    84.2 kg (185 lb 10 oz)  Height:        Intake/Output Summary (Last 24 hours) at 09/27/2017 0830 Last data filed at 09/27/2017 0544 Gross per 24 hour  Intake 2260 ml  Output 850 ml  Net 1410 ml   Filed Weights   09/26/17 0419 09/26/17 1043 09/27/17 0500  Weight: 84.3 kg (185 lb 13.6 oz) 83.9 kg (185 lb) 84.2 kg (185 lb 10 oz)    Examination:  General exam: Appears calm and comfortable  Respiratory system: Clear to auscultation. Respiratory effort normal. Cardiovascular system: S1 & S2 heard, RRR. No JVD, murmurs, rubs, gallops or clicks. No pedal edema. Gastrointestinal system: Abdomen is nondistended, soft mildly tender. No organomegaly or masses felt. Normal bowel sounds heard. Central nervous system: Alert and oriented. No focal neurological deficits. Extremities: Symmetric 5 x 5 power. Skin: No rashes, lesions or ulcers Psychiatry: Judgement and insight appear normal. Mood & affect appropriate.     Data Reviewed: I have personally reviewed following labs and imaging studies  CBC: Recent Labs  Lab  09/23/17 1048 09/24/17 0343 09/26/17 0614 09/26/17 1313 09/26/17 1635 09/27/17 0632  WBC 9.7 13.2* 8.7  --  9.3 7.1  HGB 10.7* 9.9* 9.7* 9.5* 9.5* 7.9*  HCT 31.6* 30.4* 28.5* 28.0* 28.4* 24.3*  MCV 92.1 91.3 91.9  --  90.2 90.0  PLT 199 135* 147*  --  143* 269*   Basic Metabolic Panel: Recent Labs  Lab 09/23/17 1048 09/23/17 1918 09/24/17 0343 09/25/17 0403 09/26/17 0614 09/26/17 1313  NA 140  --  138 140 137 142  K 3.1*  --  4.2 4.0 4.0 4.2  CL 103  --  101 105 107  --   CO2 26  --  24 23  22   --   GLUCOSE 145*  --  108* 90 99 108*  BUN 27*  --  28* 37* 35*  --   CREATININE 2.59*  --  2.41* 2.61* 2.17*  --   CALCIUM 9.3  --  9.0 8.6* 8.7*  --   MG  --  1.6*  --   --   --   --    GFR: Estimated Creatinine Clearance: 33.4 mL/min (A) (by C-G formula based on SCr of 2.17 mg/dL (H)). Liver Function Tests: Recent Labs  Lab 09/23/17 1130 09/24/17 0343 09/25/17 0403 09/26/17 0614  AST 163* 155* 116* 82*  ALT 53 70* 63 54  ALKPHOS 66 61 67 94  BILITOT 3.4* 4.3* 4.3* 3.5*  PROT 5.9* 5.1* 4.4* 4.4*  ALBUMIN 3.5 2.7* 2.3* 2.2*   Recent Labs  Lab 09/23/17 1130 09/24/17 0343  LIPASE 28 17   No results for input(s): AMMONIA in the last 168 hours. Coagulation Profile: Recent Labs  Lab 09/23/17 1048  INR 1.17   Cardiac Enzymes: No results for input(s): CKTOTAL, CKMB, CKMBINDEX, TROPONINI in the last 168 hours. BNP (last 3 results) No results for input(s): PROBNP in the last 8760 hours. HbA1C: No results for input(s): HGBA1C in the last 72 hours. CBG: No results for input(s): GLUCAP in the last 168 hours. Lipid Profile: No results for input(s): CHOL, HDL, LDLCALC, TRIG, CHOLHDL, LDLDIRECT in the last 72 hours. Thyroid Function Tests: No results for input(s): TSH, T4TOTAL, FREET4, T3FREE, THYROIDAB in the last 72 hours. Anemia Panel: No results for input(s): VITAMINB12, FOLATE, FERRITIN, TIBC, IRON, RETICCTPCT in the last 72 hours. Urine analysis:    Component Value Date/Time   COLORURINE YELLOW 12/20/2016 0924   APPEARANCEUR CLEAR 12/20/2016 0924   LABSPEC 1.013 12/20/2016 0924   PHURINE 7.0 12/20/2016 0924   GLUCOSEU NEGATIVE 12/20/2016 0924   HGBUR NEGATIVE 12/20/2016 0924   BILIRUBINUR NEGATIVE 12/20/2016 0924   KETONESUR NEGATIVE 12/20/2016 0924   PROTEINUR NEGATIVE 12/20/2016 0924   UROBILINOGEN 0.2 11/15/2014 2052   NITRITE NEGATIVE 12/20/2016 0924   LEUKOCYTESUR NEGATIVE 12/20/2016 0924   Sepsis  Labs: @LABRCNTIP (procalcitonin:4,lacticidven:4)  ) Recent Results (from the past 240 hour(s))  Surgical pcr screen     Status: None   Collection Time: 09/25/17  8:45 PM  Result Value Ref Range Status   MRSA, PCR NEGATIVE NEGATIVE Final   Staphylococcus aureus NEGATIVE NEGATIVE Final    Comment: (NOTE) The Xpert SA Assay (FDA approved for NASAL specimens in patients 26 years of age and older), is one component of a comprehensive surveillance program. It is not intended to diagnose infection nor to guide or monitor treatment. Performed at [facility], [place] [address], [place], [place] [address]          Radiology Studies: Dg  Chest 2 View  Result Date: 09/25/2017 CLINICAL DATA:  Chest pain. EXAM: CHEST - 2 VIEW COMPARISON:  Radiograph of September 23, 2017. FINDINGS: Stable cardiomediastinal silhouette. No pneumothorax is noted. Mild bibasilar subsegmental atelectasis is noted with minimal bilateral pleural effusions. Bony thorax is unremarkable. IMPRESSION: Mild bibasilar subsegmental atelectasis with minimal bilateral pleural effusions. Electronically Signed   By: Marijo Conception, M.D.   On: 09/25/2017 16:30        Scheduled Meds: . allopurinol  300 mg Oral Daily  . amLODipine  5 mg Oral Daily  . aspirin EC  81 mg Oral Daily  . brimonidine  1 drop Both Eyes BID  . chlorhexidine  15 mL Mouth Rinse BID  . cholecalciferol  5,000 Units Oral Daily  . dorzolamide-timolol  1 drop Both Eyes BID  . feeding supplement  1 Container Oral BID BM  . loteprednol  1 drop Right Eye BID  . mouth rinse  15 mL Mouth Rinse q12n4p  . nepafenac  1 drop Right Eye Daily   Continuous Infusions: . sodium chloride    . sodium chloride 100 mL/hr at 09/27/17 0544  . sodium chloride    . piperacillin-tazobactam (ZOSYN)  IV 3.375 g (09/27/17 0544)     LOS: 3 days    Time spent: 74 minutes    GARBA,LAWAL, MD [facility] Pager [phone]  If 7PM-7AM, please contact  night-coverage [email or web address] Password [email or web address] 09/27/2017, 8:30 AM

## 2017-09-28 DIAGNOSIS — K810 Acute cholecystitis: Secondary | ICD-10-CM

## 2017-09-28 LAB — CBC
HCT: 26.5 % — ABNORMAL LOW (ref 39.0–52.0)
HEMOGLOBIN: 9.2 g/dL — AB (ref 13.0–17.0)
MCH: 30.2 pg (ref 26.0–34.0)
MCHC: 34.7 g/dL (ref 30.0–36.0)
MCV: 86.9 fL (ref 78.0–100.0)
PLATELETS: 126 10*3/uL — AB (ref 150–400)
RBC: 3.05 MIL/uL — AB (ref 4.22–5.81)
RDW: 15.7 % — ABNORMAL HIGH (ref 11.5–15.5)
WBC: 8.8 10*3/uL (ref 4.0–10.5)

## 2017-09-28 LAB — COMPREHENSIVE METABOLIC PANEL
ALBUMIN: 1.9 g/dL — AB (ref 3.5–5.0)
ALK PHOS: 87 U/L (ref 38–126)
ALT: 34 U/L (ref 17–63)
ANION GAP: 9 (ref 5–15)
AST: 49 U/L — ABNORMAL HIGH (ref 15–41)
BUN: 42 mg/dL — ABNORMAL HIGH (ref 6–20)
CALCIUM: 8.2 mg/dL — AB (ref 8.9–10.3)
CHLORIDE: 110 mmol/L (ref 101–111)
CO2: 19 mmol/L — AB (ref 22–32)
Creatinine, Ser: 2.12 mg/dL — ABNORMAL HIGH (ref 0.61–1.24)
GFR calc Af Amer: 33 mL/min — ABNORMAL LOW (ref >60)
GFR calc non Af Amer: 28 mL/min — ABNORMAL LOW (ref >60)
Glucose, Bld: 136 mg/dL — ABNORMAL HIGH (ref 65–99)
Potassium: 4 mmol/L (ref 3.5–5.1)
SODIUM: 138 mmol/L (ref 135–145)
Total Bilirubin: 1.9 mg/dL — ABNORMAL HIGH (ref 0.3–1.2)
Total Protein: 4.1 g/dL — ABNORMAL LOW (ref 6.5–8.1)

## 2017-09-28 LAB — TYPE AND SCREEN
ABO/RH(D): A POS
Antibody Screen: NEGATIVE
UNIT DIVISION: 0
Unit division: 0

## 2017-09-28 LAB — BPAM RBC
Blood Product Expiration Date: 201904262359
Blood Product Expiration Date: 201904282359
ISSUE DATE / TIME: 201904051047
ISSUE DATE / TIME: 201904051451
Unit Type and Rh: 6200
Unit Type and Rh: 6200

## 2017-09-28 MED ORDER — HALOPERIDOL LACTATE 5 MG/ML IJ SOLN
2.0000 mg | Freq: Two times a day (BID) | INTRAMUSCULAR | Status: DC | PRN
  Administered 2017-09-28 – 2017-09-29 (×2): 2 mg via INTRAVENOUS
  Filled 2017-09-28 (×2): qty 1

## 2017-09-28 NOTE — Progress Notes (Signed)
Patient ID: NASH BOLLS, male   DOB: 1941-01-08, 77 y.o.   MRN: [id number]                                                                PROGRESS NOTE                                                                                                                                                                                                             Patient Demographics:    Terry Garner, is a 77 y.o. male, DOB - [birth year], DPO:[id number]  Admit date - 09/23/2017   Admitting Physician Phillips Grout, MD  Outpatient Primary MD for the patient is [name], MD  LOS - 4  Outpatient Specialists:  Chief Complaint  Patient presents with  . Chest Pain       Brief Narrative 77 y.o. male with medical history significant of AAA, chronic kidney disease, coronary artery disease comes in with about 2 days of worsening epigastric pain and right upper quadrant abdominal pain.  Patient has been vomiting and very nauseous with it.  He denies any fevers.  He denies any diarrhea.  He denies any association with food.  He still has his gallbladder.  Patient's feeling better after some IV morphine given in the ED.  Patient referred for admission for possible acute cholecystitis.  General surgery has been called and will be seen the patient.     Subjective:    Terry Garner today has had some visual hallucination, thinks that people are working in his room at [employer]  Family concerned about sundowning.  Denies abd pain, fever, chills, n/v, diarrhea, brbpr.     No headache, No chest pain,  No new weakness tingling or numbness, No Cough - SOB.    Assessment  & Plan :    Principal Problem:   Choledocholithiasis with acute cholecystitis Active Problems:   Carotid artery disease (HCC)   Right bundle branch block   Essential hypertension   Abdominal aortic aneurysm (HCC)   Preoperative cardiovascular examination   CKD (chronic kidney disease) stage 4, GFR 15-29 ml/min (HCC)   Abdominal  pain    #1. Cholidocholithiasis with Acute Cholecystitis: POD #2 S/P Lap Chole.  Possible DC tomorrow per surgery note  #2. Post-Op anemia: Hb has dropped  from 9.5 to 7.9 Postop. Transfuse 2 units of PRBC 4/5 Check cbc in am  #3 HTN: Controlled  #4 Carotid Artery disease: Stable. No symptoms now  #5 RBBB: Will need 2D echo. Monitor with telemetry.  #6 Hypokalemia: repleted  #7 CKD stage IV: Stable BUN/Creatinine  #8 Hypomagnesemia: magnesium repleted. Recheck level  #9 AAA: stable.  #10 Sundowning, AV hallucination Try to minimize pain medication Haldol 2mg  iv bid prn  .    Code Status : FULL CODE  Family Communication  :     Disposition Plan  : home  Barriers For Discharge :   Consults  :  surgery  Procedures  : Lap Chole 4/4, CT abd, MRCP  DVT Prophylaxis  :  SCDs   Lab Results  Component Value Date   PLT 126 (L) 09/28/2017    Antibiotics  :    Meropenem  Anti-infectives (From admission, onward)   Start     Dose/Rate Route Frequency Ordered Stop   09/26/17 2000  Ampicillin-Sulbactam (UNASYN) 3 g in sodium chloride 0.9 % 100 mL IVPB  Status:  Discontinued     3 g 200 mL/hr over 30 Minutes Intravenous Every 8 hours 09/26/17 1532 09/27/17 0738   09/26/17 1930  piperacillin-tazobactam (ZOSYN) IVPB 3.375 g     3.375 g 12.5 mL/hr over 240 Minutes Intravenous Every 8 hours 09/26/17 1553     09/24/17 1130  Ampicillin-Sulbactam (UNASYN) 3 g in sodium chloride 0.9 % 100 mL IVPB  Status:  Discontinued     3 g 200 mL/hr over 30 Minutes Intravenous Every 12 hours 09/24/17 1045 09/26/17 1532   09/24/17 1045  meropenem (MERREM) 500 mg in sodium chloride 0.9 % 100 mL IVPB  Status:  Discontinued     500 mg 200 mL/hr over 30 Minutes Intravenous Every 12 hours 09/24/17 1036 09/24/17 1045        Objective:   Vitals:   09/27/17 1518 09/27/17 1734 09/27/17 2212 09/28/17 0442  BP: (!) 133/53 (!) 147/54 (!) 144/55 (!) 150/60  Pulse: (!) 51 (!) 50 (!) 56  (!) 55  Resp: 18 18 20 20   Temp: 97.7 F (36.5 C) 98 F (36.7 C) 97.6 F (36.4 C) 97.6 F (36.4 C)  TempSrc: Oral Oral Oral Oral  SpO2: 95% 97% 96% 93%  Weight:      Height:        Wt Readings from Last 3 Encounters:  09/27/17 84.2 kg (185 lb 10 oz)  07/15/17 77.2 kg (170 lb 3.2 oz)  07/10/17 81.6 kg (180 lb)     Intake/Output Summary (Last 24 hours) at 09/28/2017 0947 Last data filed at 09/28/2017 0942 Gross per 24 hour  Intake 971.33 ml  Output 650 ml  Net 321.33 ml     Physical Exam  Awake Alert, Oriented X 3, No new F.N deficits, Normal affect [place].AT,PERRAL Supple Neck,No JVD, No cervical lymphadenopathy appriciated.  Symmetrical Chest wall movement, Good air movement bilaterally, CTAB RRR,No Gallops,Rubs or new Murmurs, No Parasternal Heave +ve B.Sounds, Abd Soft, No tenderness, No organomegaly appriciated, No rebound - guarding or rigidity. No Cyanosis, Clubbing or edema, No new Rash or bruise     Data Review:    CBC Recent Labs  Lab 09/24/17 0343 09/26/17 0614 09/26/17 1313 09/26/17 1635 09/27/17 0632 09/28/17 0001  WBC 13.2* 8.7  --  9.3 7.1 8.8  HGB 9.9* 9.7* 9.5* 9.5* 7.9* 9.2*  HCT 30.4* 28.5* 28.0* 28.4* 24.3* 26.5*  PLT 135* 147*  --  143* 144* 126*  MCV 91.3 91.9  --  90.2 90.0 86.9  MCH 29.7 31.3  --  30.2 29.3 30.2  MCHC 32.6 34.0  --  33.5 32.5 34.7  RDW 14.8 15.3  --  14.8 15.1 15.7*    Chemistries  Recent Labs  Lab 09/23/17 1918 09/24/17 0343 09/25/17 0403 09/26/17 0614 09/26/17 1313 09/27/17 0632 09/28/17 0001  NA  --  138 140 137 142 139 138  K  --  4.2 4.0 4.0 4.2 4.3 4.0  CL  --  101 105 107  --  108 110  CO2  --  24 23 22   --  21* 19*  GLUCOSE  --  108* 90 99 108* 144* 136*  BUN  --  28* 37* 35*  --  36* 42*  CREATININE  --  2.41* 2.61* 2.17*  --  2.16* 2.12*  CALCIUM  --  9.0 8.6* 8.7*  --  8.0* 8.2*  MG 1.6*  --   --   --   --   --   --   AST  --  155* 116* 82*  --  49* 49*  ALT  --  70* 63 54  --  37 34  ALKPHOS   --  61 67 94  --  66 87  BILITOT  --  4.3* 4.3* 3.5*  --  2.2* 1.9*   ------------------------------------------------------------------------------------------------------------------ No results for input(s): CHOL, HDL, LDLCALC, TRIG, CHOLHDL, LDLDIRECT in the last 72 hours.  Lab Results  Component Value Date   HGBA1C 4.9 07/15/2017   ------------------------------------------------------------------------------------------------------------------ No results for input(s): TSH, T4TOTAL, T3FREE, THYROIDAB in the last 72 hours.  Invalid input(s): FREET3 ------------------------------------------------------------------------------------------------------------------ No results for input(s): VITAMINB12, FOLATE, FERRITIN, TIBC, IRON, RETICCTPCT in the last 72 hours.  Coagulation profile Recent Labs  Lab 09/23/17 1048  INR 1.17    No results for input(s): DDIMER in the last 72 hours.  Cardiac Enzymes No results for input(s): CKMB, TROPONINI, MYOGLOBIN in the last 168 hours.  Invalid input(s): CK ------------------------------------------------------------------------------------------------------------------ No results found for: BNP  Inpatient Medications  Scheduled Meds: . allopurinol  300 mg Oral Daily  . amLODipine  5 mg Oral Daily  . aspirin EC  81 mg Oral Daily  . brimonidine  1 drop Both Eyes BID  . chlorhexidine  15 mL Mouth Rinse BID  . cholecalciferol  5,000 Units Oral Daily  . dorzolamide-timolol  1 drop Both Eyes BID  . feeding supplement  1 Container Oral BID BM  . loteprednol  1 drop Right Eye BID  . mouth rinse  15 mL Mouth Rinse q12n4p  . nepafenac  1 drop Right Eye Daily   Continuous Infusions: . sodium chloride 100 mL/hr at 09/27/17 0544  . piperacillin-tazobactam (ZOSYN)  IV Stopped (09/28/17 0905)   PRN Meds:.acetaminophen, ondansetron **OR** ondansetron (ZOFRAN) IV  Micro Results Recent Results (from the past 240 hour(s))  Surgical pcr screen      Status: None   Collection Time: 09/25/17  8:45 PM  Result Value Ref Range Status   MRSA, PCR NEGATIVE NEGATIVE Final   Staphylococcus aureus NEGATIVE NEGATIVE Final    Comment: (NOTE) The Xpert SA Assay (FDA approved for NASAL specimens in patients 61 years of age and older), is one component of a comprehensive surveillance program. It is not intended to diagnose infection nor to guide or monitor treatment. Performed at [facility], [place] [address] [place],  [address]     Radiology Reports Ct Abdomen  Pelvis Wo Contrast  Result Date: 09/23/2017 CLINICAL DATA:  Abdominal pain.  Increasing abdominal pain EXAM: CT ABDOMEN AND PELVIS WITHOUT CONTRAST TECHNIQUE: Multidetector CT imaging of the abdomen and pelvis was performed following the standard protocol without IV contrast. COMPARISON:  None. FINDINGS: Lower chest: Mild bibasilar atelectasis. Hepatobiliary: No focal hepatic lesion on noncontrast exam. Gallbladder distended to 5.5 cm. No radiodense gallstones are present. Common bile duct appears normal caliber. Small amount of free fluid along the RIGHT hepatic margin Pancreas: Pancreas is normal. No ductal dilatation. No pancreatic inflammation. Spleen: Normal spleen Adrenals/urinary tract: Adrenal glands normal. No nephrolithiasis or ureterolithiasis. Vascular calcification in the RIGHT renal hilum. No bladder calculi Stomach/Bowel: Stomach, small bowel, appendix, and cecum are normal. Multiple sigmoid diverticula. No acute diverticulitis. Vascular/Lymphatic: Abdominal aorta is normal caliber with atherosclerotic calcification. There is no retroperitoneal or periportal lymphadenopathy. No pelvic lymphadenopathy. Reproductive: Prostate normal Other: No free fluid. Musculoskeletal: No aggressive osseous lesion. IMPRESSION: 1. Gallbladder distension without of evidence gallbladder inflammation. No radiodense gallstones. Consider ultrasound if concern for cholecystitis. 2. Small free  fluid along the RIGHT hepatic lobe. 3. Extensive sigmoid diverticulosis without evidence diverticulitis. 4.  Aortic Atherosclerosis (ICD10-I70.0). Electronically Signed   By: Suzy Bouchard M.D.   On: 09/23/2017 15:33   Dg Chest 2 View  Result Date: 09/25/2017 CLINICAL DATA:  Chest pain. EXAM: CHEST - 2 VIEW COMPARISON:  Radiograph of September 23, 2017. FINDINGS: Stable cardiomediastinal silhouette. No pneumothorax is noted. Mild bibasilar subsegmental atelectasis is noted with minimal bilateral pleural effusions. Bony thorax is unremarkable. IMPRESSION: Mild bibasilar subsegmental atelectasis with minimal bilateral pleural effusions. Electronically Signed   By: Marijo Conception, M.D.   On: 09/25/2017 16:30   Mr Abdomen Mrcp Wo Contrast  Result Date: 09/24/2017 CLINICAL DATA:  Inpatient. Epigastric/right upper quadrant abdominal pain. Abnormal liver function tests. Cholelithiasis. EXAM: MRI ABDOMEN WITHOUT CONTRAST  (INCLUDING MRCP) TECHNIQUE: Multiplanar multisequence MR imaging of the abdomen was performed. Heavily T2-weighted images of the biliary and pancreatic ducts were obtained, and three-dimensional MRCP images were rendered by post processing. COMPARISON:  09/23/2017 unenhanced CT abdomen/pelvis and right upper quadrant abdominal sonogram. FINDINGS: Lower chest: Hypoventilatory changes in the dependent lung bases bilaterally. Hepatobiliary: Normal liver size and configuration. No hepatic steatosis. There is a sub 5 mm T2 hyperintense posterior right liver lobe lesion, incompletely characterized on this noncontrast scan, for which no follow-up is required unless the patient has risk factors for liver malignancy. No additional liver lesions. Distended gallbladder (5.3 cm diameter). Layering sludge and tiny 2-3 mm gallstones in the gallbladder. Borderline mild diffuse gallbladder wall thickening (gallbladder wall thickness 3 mm). No pericholecystic fluid. No biliary ductal dilatation. Common bile duct  diameter 6 mm. Small amount of layering sludge in the common bile duct, with no filling defects in the common bile duct to suggest choledocholithiasis. Small periampullary duodenal diverticulum. Pancreas: There is a 0.8 cm cystic pancreatic body lesion (series 13001/image 52) without appreciable wall thickening or internal complexity. No additional pancreatic lesions. No pancreatic duct dilation. No convincing pancreas divisum (the main pancreatic duct is drained by an accessory duct of [place], however also appears to communicate with the common bile duct). Spleen: Normal size. No mass. Adrenals/Urinary Tract: No adrenal nodules. No hydronephrosis. Normal kidneys with no renal mass. Stomach/Bowel: Grossly normal stomach. Visualized small and large bowel is normal caliber, with no bowel wall thickening. Vascular/Lymphatic: Infrarenal 4.7 cm abdominal aortic aneurysm, stable. No pathologically enlarged lymph nodes in the abdomen. Other: Trace perihepatic ascites.  No focal fluid collection. Musculoskeletal: No aggressive appearing focal osseous lesions. Bilateral posterior spinal fusion hardware is visualized in the lower lumbar spine. IMPRESSION: 1. Layering sludge and tiny gallstones in the distended gallbladder with borderline mild diffuse gallbladder wall thickening. No pericholecystic fluid. These findings are nonspecific and could be compatible with acute cholecystitis in the correct clinical setting. 2. No biliary ductal dilatation. CBD diameter 6 mm. Small amount of layering sludge in the CBD, with no evidence of choledocholithiasis. 3. 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 Follow-up MRI abdomen without and with IV contrast recommended in 2 years. This recommendation follows ACR consensus guidelines: Management of Incidental Pancreatic Cysts: A White Paper of the ACR Incidental Findings Committee. [place] 2263;33:545-625. 4. Infrarenal 4.7 cm  abdominal aortic aneurysm. Recommend followup by abdomen and pelvis CTA in 6 months, and vascular surgery referral/consultation if not already obtained. This recommendation follows ACR consensus guidelines: White Paper of the ACR Incidental Findings Committee II on Vascular Findings. J Am Coll Radiol 2013; 10:789-794. 5. Trace perihepatic ascites. Electronically Signed   By: Ilona Sorrel M.D.   On: 09/24/2017 18:09   Mr 3d Recon At Scanner  Result Date: 09/24/2017 CLINICAL DATA:  Inpatient. Epigastric/right upper quadrant abdominal pain. Abnormal liver function tests. Cholelithiasis. EXAM: MRI ABDOMEN WITHOUT CONTRAST  (INCLUDING MRCP) TECHNIQUE: Multiplanar multisequence MR imaging of the abdomen was performed. Heavily T2-weighted images of the biliary and pancreatic ducts were obtained, and three-dimensional MRCP images were rendered by post processing. COMPARISON:  09/23/2017 unenhanced CT abdomen/pelvis and right upper quadrant abdominal sonogram. FINDINGS: Lower chest: Hypoventilatory changes in the dependent lung bases bilaterally. Hepatobiliary: Normal liver size and configuration. No hepatic steatosis. There is a sub 5 mm T2 hyperintense posterior right liver lobe lesion, incompletely characterized on this noncontrast scan, for which no follow-up is required unless the patient has risk factors for liver malignancy. No additional liver lesions. Distended gallbladder (5.3 cm diameter). Layering sludge and tiny 2-3 mm gallstones in the gallbladder. Borderline mild diffuse gallbladder wall thickening (gallbladder wall thickness 3 mm). No pericholecystic fluid. No biliary ductal dilatation. Common bile duct diameter 6 mm. Small amount of layering sludge in the common bile duct, with no filling defects in the common bile duct to suggest choledocholithiasis. Small periampullary duodenal diverticulum. Pancreas: There is a 0.8 cm cystic pancreatic body lesion (series 13001/image 52) without appreciable wall  thickening or internal complexity. No additional pancreatic lesions. No pancreatic duct dilation. No convincing pancreas divisum (the main pancreatic duct is drained by an accessory duct of [place], however also appears to communicate with the common bile duct). Spleen: Normal size. No mass. Adrenals/Urinary Tract: No adrenal nodules. No hydronephrosis. Normal kidneys with no renal mass. Stomach/Bowel: Grossly normal stomach. Visualized small and large bowel is normal caliber, with no bowel wall thickening. Vascular/Lymphatic: Infrarenal 4.7 cm abdominal aortic aneurysm, stable. No pathologically enlarged lymph nodes in the abdomen. Other: Trace perihepatic ascites.  No focal fluid collection. Musculoskeletal: No aggressive appearing focal osseous lesions. Bilateral posterior spinal fusion hardware is visualized in the lower lumbar spine. IMPRESSION: 1. Layering sludge and tiny gallstones in the distended gallbladder with borderline mild diffuse gallbladder wall thickening. No pericholecystic fluid. These findings are nonspecific and could be compatible with acute cholecystitis in the correct clinical setting. 2. No biliary ductal dilatation. CBD diameter 6 mm. Small amount of layering sludge in the CBD, with no evidence of choledocholithiasis. 3. 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 Follow-up MRI abdomen without and with IV contrast recommended in 2 years. This recommendation follows ACR consensus guidelines: Management of Incidental Pancreatic Cysts: A White Paper of the ACR Incidental Findings Committee. [place] 7829;56:213-086. 4. Infrarenal 4.7 cm abdominal aortic aneurysm. Recommend followup by abdomen and pelvis CTA in 6 months, and vascular surgery referral/consultation if not already obtained. This recommendation follows ACR consensus guidelines: White Paper of the ACR Incidental Findings Committee II on Vascular Findings. J Am Coll Radiol 2013;  10:789-794. 5. Trace perihepatic ascites. Electronically Signed   By: Ilona Sorrel M.D.   On: 09/24/2017 18:09   US Abdomen Limited  Result Date: 09/23/2017 CLINICAL DATA:  Right upper quadrant pain for several days EXAM: ULTRASOUND ABDOMEN LIMITED RIGHT UPPER QUADRANT COMPARISON:  CT from earlier in the same day. FINDINGS: Gallbladder: Well distended with gallstones and sludge. No pericholecystic fluid or gallbladder wall thickening is noted. Negative sonographic Percell Miller sign is noted. Common bile duct: Diameter: 5.2 mm. Liver: No focal lesion identified. Within normal limits in parenchymal echogenicity. Portal vein is patent on color Doppler imaging with normal direction of blood flow towards the liver. IMPRESSION: Cholelithiasis without definitive complicating factors. This may still be the etiology of the patient's discomfort. Electronically Signed   By: Inez Catalina M.D.   On: 09/23/2017 16:49   Dg Chest Portable 1 View  Result Date: 09/23/2017 CLINICAL DATA:  Shortness of breath and pain EXAM: PORTABLE CHEST 1 VIEW COMPARISON:  June 27, 2017 FINDINGS: There is no edema or consolidation. Heart size and pulmonary vascularity are normal. No adenopathy. No pneumothorax. There is aortic atherosclerosis. IMPRESSION: Aortic atherosclerosis.  No edema or consolidation. Aortic Atherosclerosis (ICD10-I70.0). Electronically Signed   By: Lowella Grip III M.D.   On: 09/23/2017 11:23    Time Spent in minutes  30   Jani Gravel M.D on 09/28/2017 at 9:47 AM  Between 7am to 7pm - Pager - [phone]  After 7pm go to [email or web address] - password [facility]  [facility] -  Office  [phone]

## 2017-09-28 NOTE — Progress Notes (Signed)
Physical Therapy Treatment Patient Details Name: Terry Garner MRN: [id number] DOB: 04/06/1941 Today's Date: 09/28/2017    History of Present Illness 77yo male with complaints of epigastric pain, RUQ pain, and nausea/vomiting. He received laparoscopic cholecystectomy on 09/26/17. PMH AAA, cataracts/low vision, CKD, glaucoma, gout, HTN, lumbar and cervical surgeries, hernia repair, shoulder arthroscopy, THA     PT Comments    Patient had recently walked with wife and just got back to bed upon arrival. Therapy reviewed bed exercises with patient and patients wife/ Patients wife reports they are familiar with exercises because they are similar to his total hip exercises. Patient tolerated well. Therapy will continue to follow acutely. Plan remains appropriate.   Follow Up Recommendations  No PT follow up     Equipment Recommendations  None recommended by PT    Recommendations for Other Services       Precautions / Restrictions Precautions Precautions: Other (comment) Precaution Comments: reduced vision, abdominal drain Restrictions Weight Bearing Restrictions: No    Mobility  Bed Mobility                  Transfers                    Ambulation/Gait                 Stairs            Wheelchair Mobility    Modified Rankin (Stroke Patients Only)       Balance                                            Cognition Arousal/Alertness: Awake/alert Behavior During Therapy: WFL for tasks assessed/performed Overall Cognitive Status: Within Functional Limits for tasks assessed                                 General Comments: mildly suspect due to medications, wife reports PA-C is aware that morphine is making patient somewhat confused; he is A&Ox4 however and very pleasant       Exercises General Exercises - Lower Extremity Ankle Circles/Pumps: 20 reps Quad Sets: 10 reps;Both Gluteal Sets: 10 reps;Both Heel  Slides: 10 reps;Both Hip ABduction/ADduction: 10 reps    General Comments        Pertinent Vitals/Pain Pain Assessment: No/denies pain    Home Living                      Prior Function            PT Goals (current goals can now be found in the care plan section) Acute Rehab PT Goals Patient Stated Goal: to go home  PT Goal Formulation: With patient/family Time For Goal Achievement: 10/11/17 Potential to Achieve Goals: Good Progress towards PT goals: Progressing toward goals    Frequency    Min 3X/week      PT Plan Current plan remains appropriate    Co-evaluation              AM-PAC PT "6 Clicks" Daily Activity  Outcome Measure  Difficulty turning over in bed (including adjusting bedclothes, sheets and blankets)?: None Difficulty moving from lying on back to sitting on the side of the bed? : None Difficulty sitting down on and standing up from a  chair with arms (e.g., wheelchair, bedside commode, etc,.)?: None Help needed moving to and from a bed to chair (including a wheelchair)?: None Help needed walking in hospital room?: None Help needed climbing 3-5 steps with a railing? : None 6 Click Score: 24    End of Session Equipment Utilized During Treatment: Gait belt Activity Tolerance: Patient tolerated treatment well Patient left: in chair;with call bell/phone within reach;with family/visitor present   PT Visit Diagnosis: Muscle weakness (generalized) (M62.81);History of falling (Z91.81)     Time: 0230-0240 PT Time Calculation (min) (ACUTE ONLY): 10 min  Charges:  $Therapeutic Exercise: 8-22 mins                    G Codes:          Carney Living PT DPT  09/28/2017, 3:27 PM

## 2017-09-28 NOTE — Progress Notes (Signed)
2 Days Post-Op  Subjective: Alert.  Comfortable.  Stable. Gets confused at night.  Wife states he threw a pillow at her.  She states his mental status was normal yesterday and he ambulated in the hall.  Tolerating diet somewhat.  Holding anticoagulation due to risk of bleeding. Drainage serosanguineous.  No bile  Lab work this morning reveals bilirubin 1.9 which has come down.  Other LFTs normal.  Potassium 4.0.  Glucose 136.  Creatinine 2.12 which is baseline.  Hemoglobin 9.2.  WBC 8.8.  Objective: Vital signs in last 24 hours: Temp:  [97.6 F (36.4 C)-98.2 F (36.8 C)] 97.6 F (36.4 C) (04/06 0442) Pulse Rate:  [50-56] 55 (04/06 0442) Resp:  [17-20] 20 (04/06 0442) BP: (125-150)/(53-60) 150/60 (04/06 0442) SpO2:  [93 %-97 %] 93 % (04/06 0442) Last BM Date: 09/23/17  Intake/Output from previous day: 04/05 0701 - 04/06 0700 In: 971.3 [I.V.:553.3; Blood:368; IV Piggyback:50] Out: 240 [Drains:240] Intake/Output this shift: Total I/O In: 603.3 [I.V.:553.3; IV Piggyback:50] Out: 90 [Drains:90]  PE: Gen: Alert, NAD, pleasant HEENT: EOM's intact, pupils equal and round Card: RRR Pulm: CTAB, no W/R/R, effort normal Abd: Soft,ND, nontender, +BS, lap incisions cdi with steris in place, JP drain with serosanguinous fluid in bulb.  No bile      Lab Results:  Results for orders placed or performed during the hospital encounter of 09/23/17 (from the past 24 hour(s))  Prepare RBC     Status: None   Collection Time: 09/27/17  8:33 AM  Result Value Ref Range   Order Confirmation      ORDER PROCESSED BY BLOOD BANK BB SAMPLE OR UNITS ALREADY AVAILABLE Performed at [facility], [place] [address] [place], [place] [address]   CBC     Status: Abnormal   Collection Time: 09/28/17 12:01 AM  Result Value Ref Range   WBC 8.8 4.0 - 10.5 K/uL   RBC 3.05 (L) 4.22 - 5.81 MIL/uL   Hemoglobin 9.2 (L) 13.0 - 17.0 g/dL   HCT 26.5 (L) 39.0 - 52.0 %   MCV 86.9 78.0 - 100.0 fL   MCH  30.2 26.0 - 34.0 pg   MCHC 34.7 30.0 - 36.0 g/dL   RDW 15.7 (H) 11.5 - 15.5 %   Platelets 126 (L) 150 - 400 K/uL  Comprehensive metabolic panel     Status: Abnormal   Collection Time: 09/28/17 12:01 AM  Result Value Ref Range   Sodium 138 135 - 145 mmol/L   Potassium 4.0 3.5 - 5.1 mmol/L   Chloride 110 101 - 111 mmol/L   CO2 19 (L) 22 - 32 mmol/L   Glucose, Bld 136 (H) 65 - 99 mg/dL   BUN 42 (H) 6 - 20 mg/dL   Creatinine, Ser 2.12 (H) 0.61 - 1.24 mg/dL   Calcium 8.2 (L) 8.9 - 10.3 mg/dL   Total Protein 4.1 (L) 6.5 - 8.1 g/dL   Albumin 1.9 (L) 3.5 - 5.0 g/dL   AST 49 (H) 15 - 41 U/L   ALT 34 17 - 63 U/L   Alkaline Phosphatase 87 38 - 126 U/L   Total Bilirubin 1.9 (H) 0.3 - 1.2 mg/dL   GFR calc non Af Amer 28 (L) >60 mL/min   GFR calc Af Amer 33 (L) >60 mL/min   Anion gap 9 5 - 15     Studies/Results: No results found.  [name] allopurinol  300 mg Oral Daily  . amLODipine  5 mg Oral Daily  . aspirin  EC  81 mg Oral Daily  . brimonidine  1 drop Both Eyes BID  . chlorhexidine  15 mL Mouth Rinse BID  . cholecalciferol  5,000 Units Oral Daily  . dorzolamide-timolol  1 drop Both Eyes BID  . feeding supplement  1 Container Oral BID BM  . loteprednol  1 drop Right Eye BID  . mouth rinse  15 mL Mouth Rinse q12n4p  . nepafenac  1 drop Right Eye Daily     Assessment/Plan: s/p Procedure(s): LAPAROSCOPIC CHOLECYSTECTOMY   AAA HTN CAD s/pLAD and diagonal branch stenting back in 2006, 7 and 8 - holding plavix Carotid artery disease Chronic RBBB CKD-IV HLD R rib pain   Acute cholecystitis, ?choledocholithiasis Laparoscopicsubtotalcholecystectomy 4/4 Dr. [name] - POD 2 - unable to perform IOC but LFTs including bilirubin trending down - drain with 450cc output, serosanguinous - got 2 uPRBC yesterday, hemoglobin 7.9 to 9.2 appropriate -Continue to hold Plavix and all anticoagulants. -Check labs tomorrow -Possible discharge tomorrow if he improves.  Confusion -this  appears to be acute nocturnal encephalopathy with resolution in daytime.  Discussed with wife.  Hopefully this will be self-limited.  Discontinue all narcotics.  Ambulate more  ID -zosyn 4/4>>, unasyn 4/2>>4/4 FEN -IVF, full liquids VTE -SCDs Foley -none  Plan Regular diet Continue JP drain and zosyn; monitor drain output. Encourage OOB to chair today, consult PT. D/C narcotic use. Continue to hold plavix. Getting 2 uPRBC per primary.    @PROBHOSP @  LOS: 4 days    Adin Hector 09/28/2017  . .prob

## 2017-09-29 LAB — COMPREHENSIVE METABOLIC PANEL
ALK PHOS: 98 U/L (ref 38–126)
ALT: 33 U/L (ref 17–63)
AST: 50 U/L — AB (ref 15–41)
Albumin: 1.7 g/dL — ABNORMAL LOW (ref 3.5–5.0)
Anion gap: 9 (ref 5–15)
BUN: 43 mg/dL — AB (ref 6–20)
CHLORIDE: 110 mmol/L (ref 101–111)
CO2: 21 mmol/L — AB (ref 22–32)
CREATININE: 2.17 mg/dL — AB (ref 0.61–1.24)
Calcium: 8.1 mg/dL — ABNORMAL LOW (ref 8.9–10.3)
GFR calc Af Amer: 32 mL/min — ABNORMAL LOW (ref >60)
GFR calc non Af Amer: 27 mL/min — ABNORMAL LOW (ref >60)
Glucose, Bld: 116 mg/dL — ABNORMAL HIGH (ref 65–99)
Potassium: 4 mmol/L (ref 3.5–5.1)
SODIUM: 140 mmol/L (ref 135–145)
Total Bilirubin: 1.7 mg/dL — ABNORMAL HIGH (ref 0.3–1.2)
Total Protein: 4 g/dL — ABNORMAL LOW (ref 6.5–8.1)

## 2017-09-29 LAB — CBC
HCT: 28.2 % — ABNORMAL LOW (ref 39.0–52.0)
HEMOGLOBIN: 9.6 g/dL — AB (ref 13.0–17.0)
MCH: 29.9 pg (ref 26.0–34.0)
MCHC: 34 g/dL (ref 30.0–36.0)
MCV: 87.9 fL (ref 78.0–100.0)
PLATELETS: 143 10*3/uL — AB (ref 150–400)
RBC: 3.21 MIL/uL — AB (ref 4.22–5.81)
RDW: 15.9 % — ABNORMAL HIGH (ref 11.5–15.5)
WBC: 9.8 10*3/uL (ref 4.0–10.5)

## 2017-09-29 LAB — TSH: TSH: 3.166 u[IU]/mL (ref 0.350–4.500)

## 2017-09-29 MED ORDER — PANTOPRAZOLE SODIUM 40 MG PO TBEC
40.0000 mg | DELAYED_RELEASE_TABLET | Freq: Every day | ORAL | Status: DC
  Administered 2017-09-29 – 2017-10-01 (×3): 40 mg via ORAL
  Filled 2017-09-29 (×3): qty 1

## 2017-09-29 MED ORDER — KETOROLAC TROMETHAMINE 15 MG/ML IJ SOLN
15.0000 mg | Freq: Once | INTRAMUSCULAR | Status: AC
  Administered 2017-09-29: 15 mg via INTRAVENOUS
  Filled 2017-09-29: qty 1

## 2017-09-29 MED ORDER — TRAZODONE HCL 50 MG PO TABS
50.0000 mg | ORAL_TABLET | Freq: Once | ORAL | Status: AC
  Administered 2017-09-29: 50 mg via ORAL
  Filled 2017-09-29: qty 1

## 2017-09-29 MED ORDER — HYOSCYAMINE SULFATE 0.5 MG/ML IJ SOLN
0.2500 mg | Freq: Once | INTRAMUSCULAR | Status: AC
  Administered 2017-09-29: 0.25 mg via INTRAVENOUS
  Filled 2017-09-29: qty 0.5

## 2017-09-29 MED ORDER — TRAMADOL HCL 50 MG PO TABS
50.0000 mg | ORAL_TABLET | Freq: Two times a day (BID) | ORAL | Status: DC | PRN
  Administered 2017-09-29 – 2017-09-30 (×2): 50 mg via ORAL
  Filled 2017-09-29 (×2): qty 1

## 2017-09-29 NOTE — Progress Notes (Signed)
3 Days Post-Op  Subjective: Had a better night according to wife and patient.  Less confusion. Eating a little bit better. Having bowel movements Ambulating the hall with wife.  PT has signed off stating no follow-up recommendations required.  JP drainage remains serosanguineous. Hemoglobin 9.6.  Stable.  WBC 9800.  Total bilirubin down to 1.7.  LFTs basically normal.  Creatinine 2.17 which is baseline.  Glucose 116.  Potassium 4.0.  Objective: Vital signs in last 24 hours: Temp:  [97.6 F (36.4 C)-97.7 F (36.5 C)] 97.6 F (36.4 C) (04/07 0535) Pulse Rate:  [46-57] 47 (04/07 0535) Resp:  [17-18] 17 (04/07 0535) BP: (117-158)/(47-60) 158/47 (04/07 0535) SpO2:  [95 %-97 %] 97 % (04/07 0535) Weight:  [82.9 kg (182 lb 12.2 oz)] 82.9 kg (182 lb 12.2 oz) (04/07 0535) Last BM Date: 09/28/17  Intake/Output from previous day: 04/06 0701 - 04/07 0700 In: 2478.3 [P.O.:240; I.V.:2138.3; IV Piggyback:100] Out: 935 [Urine:600; Drains:335] Intake/Output this shift: No intake/output data recorded.   PE: Gen: Alert, NAD, pleasant HEENT: EOM's intact, pupils equal and round, sclera clear Card: RRR Pulm: CTAB, no W/R/R, effort normal Abd: Soft,ND,nontender,+BS,lap incisions cdi with steris in place, JP drain with serosanguinous fluid in bulb.  No bile     Lab Results:  Results for orders placed or performed during the hospital encounter of 09/23/17 (from the past 24 hour(s))  CBC     Status: Abnormal   Collection Time: 09/29/17  3:57 AM  Result Value Ref Range   WBC 9.8 4.0 - 10.5 K/uL   RBC 3.21 (L) 4.22 - 5.81 MIL/uL   Hemoglobin 9.6 (L) 13.0 - 17.0 g/dL   HCT 28.2 (L) 39.0 - 52.0 %   MCV 87.9 78.0 - 100.0 fL   MCH 29.9 26.0 - 34.0 pg   MCHC 34.0 30.0 - 36.0 g/dL   RDW 15.9 (H) 11.5 - 15.5 %   Platelets 143 (L) 150 - 400 K/uL  Comprehensive metabolic panel     Status: Abnormal   Collection Time: 09/29/17  3:57 AM  Result Value Ref Range   Sodium 140 135 - 145 mmol/L    Potassium 4.0 3.5 - 5.1 mmol/L   Chloride 110 101 - 111 mmol/L   CO2 21 (L) 22 - 32 mmol/L   Glucose, Bld 116 (H) 65 - 99 mg/dL   BUN 43 (H) 6 - 20 mg/dL   Creatinine, Ser 2.17 (H) 0.61 - 1.24 mg/dL   Calcium 8.1 (L) 8.9 - 10.3 mg/dL   Total Protein 4.0 (L) 6.5 - 8.1 g/dL   Albumin 1.7 (L) 3.5 - 5.0 g/dL   AST 50 (H) 15 - 41 U/L   ALT 33 17 - 63 U/L   Alkaline Phosphatase 98 38 - 126 U/L   Total Bilirubin 1.7 (H) 0.3 - 1.2 mg/dL   GFR calc non Af Amer 27 (L) >60 mL/min   GFR calc Af Amer 32 (L) >60 mL/min   Anion gap 9 5 - 15     Studies/Results: No results found.  [name] allopurinol  300 mg Oral Daily  . amLODipine  5 mg Oral Daily  . aspirin EC  81 mg Oral Daily  . brimonidine  1 drop Both Eyes BID  . chlorhexidine  15 mL Mouth Rinse BID  . cholecalciferol  5,000 Units Oral Daily  . dorzolamide-timolol  1 drop Both Eyes BID  . feeding supplement  1 Container Oral BID BM  . loteprednol  1 drop Right Eye  BID  . mouth rinse  15 mL Mouth Rinse q12n4p  . nepafenac  1 drop Right Eye Daily  . pantoprazole  40 mg Oral Daily     Assessment/Plan: s/p Procedure(s): LAPAROSCOPIC CHOLECYSTECTOMY  AAA HTN CAD s/pLAD and diagonal branch stenting back in 2006, 7 and 8 - holding plavix Carotid artery disease Chronic RBBB CKD-IV HLD R rib pain  Acute cholecystitis,?choledocholithiasis Laparoscopicsubtotalcholecystectomy4/4 Dr. [name] - POD 3 - unable to perform IOCbut LFTs including bilirubin trending down - drain with  output, serosanguinous - got 2 uPRBC hemoglobin stable following transfusion -Continue to hold Plavix and all anticoagulants. -Possible discharge 4/8 if stable medically   Confusion -this appears to be acute nocturnal encephalopathy with resolution in daytime.  Better Discussed with wife.  Hopefully this will be self-limited.  Discontinue all narcotics.  Haldol as needed Ambulate more  ID -zosyn 4/4>>,unasyn 4/2>>4/4 FEN -IVF,full  liquids VTE -SCDs Foley -none  Plan Regular diet Continue JP drain and zosyn; monitor drain output. Encourage OOBto chairtoday, consult PT. D/C narcotic use. Continue to hold plavix. Leave drain in at discharge Possible discharge Monday, April 8 Follow-up Dr. Valeta Harms surgery. 1 week. Loaded onto AVS     @PROBHOSP @  LOS: 5 days    Terry Garner 09/29/2017  . .prob

## 2017-09-29 NOTE — Progress Notes (Signed)
Patient ID: PAO HAFFEY, male   DOB: Jul 13, 1940, 77 y.o.   MRN: [id number]                                                                PROGRESS NOTE                                                                                                                                                                                                             Patient Demographics:    Terry Garner, is a 77 y.o. male, DOB - [birth year], LAG:[id number]  Admit date - 09/23/2017   Admitting Physician Phillips Grout, MD  Outpatient Primary MD for the patient is [name], MD  LOS - 5  Outpatient Specialists:     Chief Complaint  Patient presents with  . Chest Pain       Brief Narrative     77 y.o.malewith medical history significant ofAAA, chronic kidney disease, coronary artery disease comes in with about 2 days of worsening epigastric pain and right upper quadrant abdominal pain. Patient has been vomiting and very nauseous with it. He denies any fevers. He denies any diarrhea. He denies any association with food. He still has his gallbladder. Patient's feeling better after some IV morphine given in the ED. Patient referred for admission for possible acute cholecystitis. General surgery has been called and will be seen the patient.       Subjective:    Terry Garner today has had no hallucination over nite. Pt did have some abdominal pain last nite but appears better this am.  Pt denies cp, palp, sob, n/v, diarrhea, brbpr.   No headache, No chest pain,  -  No new weakness tingling or numbness, No Cough -   Assessment  & Plan :    Principal Problem:   Choledocholithiasis with acute cholecystitis Active Problems:   Carotid artery disease (HCC)   Right bundle branch block   Essential hypertension   Abdominal aortic aneurysm (HCC)   Preoperative cardiovascular examination   CKD (chronic kidney disease) stage 4, GFR 15-29 ml/min (HCC)   Abdominal pain   Acute  cholecystitis    #1. Cholidocholithiasis with Acute Cholecystitis:POD #3 S/P Lap Chole.  Cont  Zosyn Appreciate surgery input Start protonix 40mg  po qday for abdominal pain Start  tramadol 50mg  po bid prn abdominal pain  #2.Post-Op anemia: Hb has dropped from 9.5 to 7.9 Postop. Transfuse 2 units of PRBC 4/5 Check cbc in am  #3 DQQ:[id number]  #4 Carotid Artery disease:Stable. No symptoms now  #5 RBBB: , Bradycardia   Monitor with telemetry. If hr continues to be low may need to stop Timolol  #6 Hypokalemia:repleted  #7 CKD stage HE:RDEYCX BUN/Creatinine  #8 Hypomagnesemia: magnesium repleted. Recheck level  #9AAA:stable.  #10 Sundowning, AV hallucination Try to minimize pain medication Haldol 2mg  iv bid prn  #11 Severe protein calorie malnutrition Start on Prostat    .    Code Status : FULL CODE  Family Communication  :     Disposition Plan  : home  Barriers For Discharge :   Consults  :  surgery  Procedures  : Lap Chole 4/4, CT abd, MRCP  DVT Prophylaxis  :  SCDs   RecentLabs       Lab Results  Component Value Date   PLT 126 (L) 09/28/2017      Antibiotics  :    Meropenem4/2=>4/4 , Zosyn 4/4=>        Lab Results  Component Value Date   PLT 143 (L) 09/29/2017     Anti-infectives (From admission, onward)   Start     Dose/Rate Route Frequency Ordered Stop   09/26/17 2000  Ampicillin-Sulbactam (UNASYN) 3 g in sodium chloride 0.9 % 100 mL IVPB  Status:  Discontinued     3 g 200 mL/hr over 30 Minutes Intravenous Every 8 hours 09/26/17 1532 09/27/17 0738   09/26/17 1930  piperacillin-tazobactam (ZOSYN) IVPB 3.375 g     3.375 g 12.5 mL/hr over 240 Minutes Intravenous Every 8 hours 09/26/17 1553     09/24/17 1130  Ampicillin-Sulbactam (UNASYN) 3 g in sodium chloride 0.9 % 100 mL IVPB  Status:  Discontinued     3 g 200 mL/hr over 30 Minutes Intravenous Every 12 hours 09/24/17 1045 09/26/17 1532   09/24/17  1045  meropenem (MERREM) 500 mg in sodium chloride 0.9 % 100 mL IVPB  Status:  Discontinued     500 mg 200 mL/hr over 30 Minutes Intravenous Every 12 hours 09/24/17 1036 09/24/17 1045        Objective:   Vitals:   09/28/17 0442 09/28/17 1411 09/28/17 2101 09/29/17 0535  BP: (!) 150/60 (!) 117/59 (!) 148/60 (!) 158/47  Pulse: (!) 55 (!) 46 (!) 57 (!) 47  Resp: 20 18 18 17   Temp: 97.6 F (36.4 C) 97.7 F (36.5 C) 97.7 F (36.5 C) 97.6 F (36.4 C)  TempSrc: Oral Oral Oral Oral  SpO2: 93% 95% 96% 97%  Weight:    82.9 kg (182 lb 12.2 oz)  Height:        Wt Readings from Last 3 Encounters:  09/29/17 82.9 kg (182 lb 12.2 oz)  07/15/17 77.2 kg (170 lb 3.2 oz)  07/10/17 81.6 kg (180 lb)     Intake/Output Summary (Last 24 hours) at 09/29/2017 0733 Last data filed at 09/29/2017 0538 Gross per 24 hour  Intake 2478.33 ml  Output 935 ml  Net 1543.33 ml     Physical Exam  Awake Alert, Oriented X 3, No new F.N deficits, Normal affect [place].AT,PERRAL Supple Neck,No JVD, No cervical lymphadenopathy appriciated.  Symmetrical Chest wall movement, Good air movement bilaterally, CTAB RRR,No Gallops,Rubs or new Murmurs, No Parasternal Heave +ve B.Sounds, Abd Soft, No tenderness, No organomegaly appriciated, No rebound - guarding or rigidity.  No Cyanosis, Clubbing or edema, No new Rash or bruise    JP drain in place    Data Review:    CBC Recent Labs  Lab 09/26/17 0614 09/26/17 1313 09/26/17 1635 09/27/17 0632 09/28/17 0001 09/29/17 0357  WBC 8.7  --  9.3 7.1 8.8 9.8  HGB 9.7* 9.5* 9.5* 7.9* 9.2* 9.6*  HCT 28.5* 28.0* 28.4* 24.3* 26.5* 28.2*  PLT 147*  --  143* 144* 126* 143*  MCV 91.9  --  90.2 90.0 86.9 87.9  MCH 31.3  --  30.2 29.3 30.2 29.9  MCHC 34.0  --  33.5 32.5 34.7 34.0  RDW 15.3  --  14.8 15.1 15.7* 15.9*    Chemistries  Recent Labs  Lab 09/23/17 1918  09/25/17 0403 09/26/17 0614 09/26/17 1313 09/27/17 0632 09/28/17 0001 09/29/17 0357  NA  --    < >  140 137 142 139 138 140  K  --    < > 4.0 4.0 4.2 4.3 4.0 4.0  CL  --    < > 105 107  --  108 110 110  CO2  --    < > 23 22  --  21* 19* 21*  GLUCOSE  --    < > 90 99 108* 144* 136* 116*  BUN  --    < > 37* 35*  --  36* 42* 43*  CREATININE  --    < > 2.61* 2.17*  --  2.16* 2.12* 2.17*  CALCIUM  --    < > 8.6* 8.7*  --  8.0* 8.2* 8.1*  MG 1.6*  --   --   --   --   --   --   --   AST  --    < > 116* 82*  --  49* 49* 50*  ALT  --    < > 63 54  --  37 34 33  ALKPHOS  --    < > 67 94  --  66 87 98  BILITOT  --    < > 4.3* 3.5*  --  2.2* 1.9* 1.7*   < > = values in this interval not displayed.   ------------------------------------------------------------------------------------------------------------------ No results for input(s): CHOL, HDL, LDLCALC, TRIG, CHOLHDL, LDLDIRECT in the last 72 hours.  Lab Results  Component Value Date   HGBA1C 4.9 07/15/2017   ------------------------------------------------------------------------------------------------------------------ No results for input(s): TSH, T4TOTAL, T3FREE, THYROIDAB in the last 72 hours.  Invalid input(s): FREET3 ------------------------------------------------------------------------------------------------------------------ No results for input(s): VITAMINB12, FOLATE, FERRITIN, TIBC, IRON, RETICCTPCT in the last 72 hours.  Coagulation profile Recent Labs  Lab 09/23/17 1048  INR 1.17    No results for input(s): DDIMER in the last 72 hours.  Cardiac Enzymes No results for input(s): CKMB, TROPONINI, MYOGLOBIN in the last 168 hours.  Invalid input(s): CK ------------------------------------------------------------------------------------------------------------------ No results found for: BNP  Inpatient Medications  Scheduled Meds: . allopurinol  300 mg Oral Daily  . amLODipine  5 mg Oral Daily  . aspirin EC  81 mg Oral Daily  . brimonidine  1 drop Both Eyes BID  . chlorhexidine  15 mL Mouth Rinse BID  .  cholecalciferol  5,000 Units Oral Daily  . dorzolamide-timolol  1 drop Both Eyes BID  . feeding supplement  1 Container Oral BID BM  . loteprednol  1 drop Right Eye BID  . mouth rinse  15 mL Mouth Rinse q12n4p  . nepafenac  1 drop Right Eye Daily   Continuous Infusions: .  sodium chloride 100 mL/hr at 09/28/17 1548  . piperacillin-tazobactam (ZOSYN)  IV 3.375 g (09/29/17 0705)   PRN Meds:.acetaminophen, haloperidol lactate, ondansetron **OR** ondansetron (ZOFRAN) IV  Micro Results Recent Results (from the past 240 hour(s))  Surgical pcr screen     Status: None   Collection Time: 09/25/17  8:45 PM  Result Value Ref Range Status   MRSA, PCR NEGATIVE NEGATIVE Final   Staphylococcus aureus NEGATIVE NEGATIVE Final    Comment: (NOTE) The Xpert SA Assay (FDA approved for NASAL specimens in patients 53 years of age and older), is one component of a comprehensive surveillance program. It is not intended to diagnose infection nor to guide or monitor treatment. Performed at [facility], [place] [address], [place], [place] [address]     Radiology Reports Ct Abdomen Pelvis Wo Contrast  Result Date: 09/23/2017 CLINICAL DATA:  Abdominal pain.  Increasing abdominal pain EXAM: CT ABDOMEN AND PELVIS WITHOUT CONTRAST TECHNIQUE: Multidetector CT imaging of the abdomen and pelvis was performed following the standard protocol without IV contrast. COMPARISON:  None. FINDINGS: Lower chest: Mild bibasilar atelectasis. Hepatobiliary: No focal hepatic lesion on noncontrast exam. Gallbladder distended to 5.5 cm. No radiodense gallstones are present. Common bile duct appears normal caliber. Small amount of free fluid along the RIGHT hepatic margin Pancreas: Pancreas is normal. No ductal dilatation. No pancreatic inflammation. Spleen: Normal spleen Adrenals/urinary tract: Adrenal glands normal. No nephrolithiasis or ureterolithiasis. Vascular calcification in the RIGHT renal hilum. No bladder calculi  Stomach/Bowel: Stomach, small bowel, appendix, and cecum are normal. Multiple sigmoid diverticula. No acute diverticulitis. Vascular/Lymphatic: Abdominal aorta is normal caliber with atherosclerotic calcification. There is no retroperitoneal or periportal lymphadenopathy. No pelvic lymphadenopathy. Reproductive: Prostate normal Other: No free fluid. Musculoskeletal: No aggressive osseous lesion. IMPRESSION: 1. Gallbladder distension without of evidence gallbladder inflammation. No radiodense gallstones. Consider ultrasound if concern for cholecystitis. 2. Small free fluid along the RIGHT hepatic lobe. 3. Extensive sigmoid diverticulosis without evidence diverticulitis. 4.  Aortic Atherosclerosis (ICD10-I70.0). Electronically Signed   By: Suzy Bouchard M.D.   On: 09/23/2017 15:33   Dg Chest 2 View  Result Date: 09/25/2017 CLINICAL DATA:  Chest pain. EXAM: CHEST - 2 VIEW COMPARISON:  Radiograph of September 23, 2017. FINDINGS: Stable cardiomediastinal silhouette. No pneumothorax is noted. Mild bibasilar subsegmental atelectasis is noted with minimal bilateral pleural effusions. Bony thorax is unremarkable. IMPRESSION: Mild bibasilar subsegmental atelectasis with minimal bilateral pleural effusions. Electronically Signed   By: Marijo Conception, M.D.   On: 09/25/2017 16:30   Mr Abdomen Mrcp Wo Contrast  Result Date: 09/24/2017 CLINICAL DATA:  Inpatient. Epigastric/right upper quadrant abdominal pain. Abnormal liver function tests. Cholelithiasis. EXAM: MRI ABDOMEN WITHOUT CONTRAST  (INCLUDING MRCP) TECHNIQUE: Multiplanar multisequence MR imaging of the abdomen was performed. Heavily T2-weighted images of the biliary and pancreatic ducts were obtained, and three-dimensional MRCP images were rendered by post processing. COMPARISON:  09/23/2017 unenhanced CT abdomen/pelvis and right upper quadrant abdominal sonogram. FINDINGS: Lower chest: Hypoventilatory changes in the dependent lung bases bilaterally. Hepatobiliary:  Normal liver size and configuration. No hepatic steatosis. There is a sub 5 mm T2 hyperintense posterior right liver lobe lesion, incompletely characterized on this noncontrast scan, for which no follow-up is required unless the patient has risk factors for liver malignancy. No additional liver lesions. Distended gallbladder (5.3 cm diameter). Layering sludge and tiny 2-3 mm gallstones in the gallbladder. Borderline mild diffuse gallbladder wall thickening (gallbladder wall thickness 3 mm). No pericholecystic fluid. No biliary ductal dilatation. Common bile  duct diameter 6 mm. Small amount of layering sludge in the common bile duct, with no filling defects in the common bile duct to suggest choledocholithiasis. Small periampullary duodenal diverticulum. Pancreas: There is a 0.8 cm cystic pancreatic body lesion (series 13001/image 52) without appreciable wall thickening or internal complexity. No additional pancreatic lesions. No pancreatic duct dilation. No convincing pancreas divisum (the main pancreatic duct is drained by an accessory duct of [place], however also appears to communicate with the common bile duct). Spleen: Normal size. No mass. Adrenals/Urinary Tract: No adrenal nodules. No hydronephrosis. Normal kidneys with no renal mass. Stomach/Bowel: Grossly normal stomach. Visualized small and large bowel is normal caliber, with no bowel wall thickening. Vascular/Lymphatic: Infrarenal 4.7 cm abdominal aortic aneurysm, stable. No pathologically enlarged lymph nodes in the abdomen. Other: Trace perihepatic ascites.  No focal fluid collection. Musculoskeletal: No aggressive appearing focal osseous lesions. Bilateral posterior spinal fusion hardware is visualized in the lower lumbar spine. IMPRESSION: 1. Layering sludge and tiny gallstones in the distended gallbladder with borderline mild diffuse gallbladder wall thickening. No pericholecystic fluid. These findings are nonspecific and could be compatible with  acute cholecystitis in the correct clinical setting. 2. No biliary ductal dilatation. CBD diameter 6 mm. Small amount of layering sludge in the CBD, with no evidence of choledocholithiasis. 3. 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 Follow-up MRI abdomen without and with IV contrast recommended in 2 years. This recommendation follows ACR consensus guidelines: Management of Incidental Pancreatic Cysts: A White Paper of the ACR Incidental Findings Committee. [place] 8938;10:175-102. 4. Infrarenal 4.7 cm abdominal aortic aneurysm. Recommend followup by abdomen and pelvis CTA in 6 months, and vascular surgery referral/consultation if not already obtained. This recommendation follows ACR consensus guidelines: White Paper of the ACR Incidental Findings Committee II on Vascular Findings. J Am Coll Radiol 2013; 10:789-794. 5. Trace perihepatic ascites. Electronically Signed   By: Ilona Sorrel M.D.   On: 09/24/2017 18:09   Mr 3d Recon At Scanner  Result Date: 09/24/2017 CLINICAL DATA:  Inpatient. Epigastric/right upper quadrant abdominal pain. Abnormal liver function tests. Cholelithiasis. EXAM: MRI ABDOMEN WITHOUT CONTRAST  (INCLUDING MRCP) TECHNIQUE: Multiplanar multisequence MR imaging of the abdomen was performed. Heavily T2-weighted images of the biliary and pancreatic ducts were obtained, and three-dimensional MRCP images were rendered by post processing. COMPARISON:  09/23/2017 unenhanced CT abdomen/pelvis and right upper quadrant abdominal sonogram. FINDINGS: Lower chest: Hypoventilatory changes in the dependent lung bases bilaterally. Hepatobiliary: Normal liver size and configuration. No hepatic steatosis. There is a sub 5 mm T2 hyperintense posterior right liver lobe lesion, incompletely characterized on this noncontrast scan, for which no follow-up is required unless the patient has risk factors for liver malignancy. No additional liver lesions.  Distended gallbladder (5.3 cm diameter). Layering sludge and tiny 2-3 mm gallstones in the gallbladder. Borderline mild diffuse gallbladder wall thickening (gallbladder wall thickness 3 mm). No pericholecystic fluid. No biliary ductal dilatation. Common bile duct diameter 6 mm. Small amount of layering sludge in the common bile duct, with no filling defects in the common bile duct to suggest choledocholithiasis. Small periampullary duodenal diverticulum. Pancreas: There is a 0.8 cm cystic pancreatic body lesion (series 13001/image 52) without appreciable wall thickening or internal complexity. No additional pancreatic lesions. No pancreatic duct dilation. No convincing pancreas divisum (the main pancreatic duct is drained by an accessory duct of [place], however also appears to communicate with the common bile duct). Spleen: Normal size. No mass. Adrenals/Urinary Tract:  No adrenal nodules. No hydronephrosis. Normal kidneys with no renal mass. Stomach/Bowel: Grossly normal stomach. Visualized small and large bowel is normal caliber, with no bowel wall thickening. Vascular/Lymphatic: Infrarenal 4.7 cm abdominal aortic aneurysm, stable. No pathologically enlarged lymph nodes in the abdomen. Other: Trace perihepatic ascites.  No focal fluid collection. Musculoskeletal: No aggressive appearing focal osseous lesions. Bilateral posterior spinal fusion hardware is visualized in the lower lumbar spine. IMPRESSION: 1. Layering sludge and tiny gallstones in the distended gallbladder with borderline mild diffuse gallbladder wall thickening. No pericholecystic fluid. These findings are nonspecific and could be compatible with acute cholecystitis in the correct clinical setting. 2. No biliary ductal dilatation. CBD diameter 6 mm. Small amount of layering sludge in the CBD, with no evidence of choledocholithiasis. 3. 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 Follow-up MRI abdomen  without and with IV contrast recommended in 2 years. This recommendation follows ACR consensus guidelines: Management of Incidental Pancreatic Cysts: A White Paper of the ACR Incidental Findings Committee. [place] 5364;68:032-122. 4. Infrarenal 4.7 cm abdominal aortic aneurysm. Recommend followup by abdomen and pelvis CTA in 6 months, and vascular surgery referral/consultation if not already obtained. This recommendation follows ACR consensus guidelines: White Paper of the ACR Incidental Findings Committee II on Vascular Findings. J Am Coll Radiol 2013; 10:789-794. 5. Trace perihepatic ascites. Electronically Signed   By: Ilona Sorrel M.D.   On: 09/24/2017 18:09   US Abdomen Limited  Result Date: 09/23/2017 CLINICAL DATA:  Right upper quadrant pain for several days EXAM: ULTRASOUND ABDOMEN LIMITED RIGHT UPPER QUADRANT COMPARISON:  CT from earlier in the same day. FINDINGS: Gallbladder: Well distended with gallstones and sludge. No pericholecystic fluid or gallbladder wall thickening is noted. Negative sonographic Percell Miller sign is noted. Common bile duct: Diameter: 5.2 mm. Liver: No focal lesion identified. Within normal limits in parenchymal echogenicity. Portal vein is patent on color Doppler imaging with normal direction of blood flow towards the liver. IMPRESSION: Cholelithiasis without definitive complicating factors. This may still be the etiology of the patient's discomfort. Electronically Signed   By: Inez Catalina M.D.   On: 09/23/2017 16:49   Dg Chest Portable 1 View  Result Date: 09/23/2017 CLINICAL DATA:  Shortness of breath and pain EXAM: PORTABLE CHEST 1 VIEW COMPARISON:  June 27, 2017 FINDINGS: There is no edema or consolidation. Heart size and pulmonary vascularity are normal. No adenopathy. No pneumothorax. There is aortic atherosclerosis. IMPRESSION: Aortic atherosclerosis.  No edema or consolidation. Aortic Atherosclerosis (ICD10-I70.0). Electronically Signed   By: Lowella Grip  III M.D.   On: 09/23/2017 11:23    Time Spent in minutes  30   Jani Gravel M.D on 09/29/2017 at 7:33 AM  Between 7am to 7pm - Pager - [phone]    After 7pm go to [email or web address] - password [facility]  [facility] -  Office  [phone]

## 2017-09-30 LAB — COMPREHENSIVE METABOLIC PANEL
ALBUMIN: 1.6 g/dL — AB (ref 3.5–5.0)
ALK PHOS: 102 U/L (ref 38–126)
ALT: 31 U/L (ref 17–63)
ANION GAP: 9 (ref 5–15)
AST: 50 U/L — ABNORMAL HIGH (ref 15–41)
BUN: 38 mg/dL — ABNORMAL HIGH (ref 6–20)
CALCIUM: 7.7 mg/dL — AB (ref 8.9–10.3)
CO2: 18 mmol/L — ABNORMAL LOW (ref 22–32)
Chloride: 112 mmol/L — ABNORMAL HIGH (ref 101–111)
Creatinine, Ser: 2.16 mg/dL — ABNORMAL HIGH (ref 0.61–1.24)
GFR calc non Af Amer: 28 mL/min — ABNORMAL LOW (ref >60)
GFR, EST AFRICAN AMERICAN: 32 mL/min — AB (ref >60)
GLUCOSE: 87 mg/dL (ref 65–99)
POTASSIUM: 3.8 mmol/L (ref 3.5–5.1)
Sodium: 139 mmol/L (ref 135–145)
Total Bilirubin: 2.5 mg/dL — ABNORMAL HIGH (ref 0.3–1.2)
Total Protein: 4 g/dL — ABNORMAL LOW (ref 6.5–8.1)

## 2017-09-30 LAB — CBC
HCT: 27.3 % — ABNORMAL LOW (ref 39.0–52.0)
HEMOGLOBIN: 9.2 g/dL — AB (ref 13.0–17.0)
MCH: 30 pg (ref 26.0–34.0)
MCHC: 33.7 g/dL (ref 30.0–36.0)
MCV: 88.9 fL (ref 78.0–100.0)
Platelets: 147 10*3/uL — ABNORMAL LOW (ref 150–400)
RBC: 3.07 MIL/uL — ABNORMAL LOW (ref 4.22–5.81)
RDW: 16.4 % — AB (ref 11.5–15.5)
WBC: 8.4 10*3/uL (ref 4.0–10.5)

## 2017-09-30 NOTE — Progress Notes (Signed)
Patient ID: Terry Garner, male   DOB: 1940/12/27, 77 y.o.   MRN: [id number]                                                                PROGRESS NOTE                                                                                                                                                                                                             Patient Demographics:    Terry Garner, is a 77 y.o. male, DOB - 1940/08/23, FTD:[id number]  Admit date - 09/23/2017   Admitting Physician Phillips Grout, MD  Outpatient Primary MD for the patient is [name], MD  LOS - 6  Outpatient Specialists:     Chief Complaint  Patient presents with  . Chest Pain       Brief Narrative    77 y.o.malewith medical history significant ofAAA, chronic kidney disease, coronary artery disease comes in with about 2 days of worsening epigastric pain and right upper quadrant abdominal pain. Patient has been vomiting and very nauseous with it. He denies any fevers. He denies any diarrhea. He denies any association with food. He still has his gallbladder. Patient's feeling better after some IV morphine given in the ED. Patient referred for admission for possible acute cholecystitis. General surgery has been called and will be seen the patient.         Subjective:    [name] today is doing better.  No abd pain, but having serous drainage around JP.   Family is concerned.  Denies fever, chills, n/v, diarrhea, brbpr.    No headache, No chest pain,   No new weakness tingling or numbness, No Cough - SOB.    Assessment  & Plan :    Principal Problem:   Choledocholithiasis with acute cholecystitis Active Problems:   Carotid artery disease (HCC)   Right bundle branch block   Essential hypertension   Abdominal aortic aneurysm (HCC)   Preoperative cardiovascular examination   CKD (chronic kidney disease) stage 4, GFR 15-29 ml/min (HCC)   Abdominal pain   Acute  cholecystitis      #1. Cholidocholithiasis with Acute Cholecystitis:POD #3S/P Lap Chole.  Cont  Zosyn Appreciate surgery input Start protonix 40mg  po qday for abdominal pain  Start tramadol 50mg  po bid prn abdominal pain  #2.Post-Op anemia: Hb has dropped from 9.5 to 7.9 Postop. Transfuse 2 units of PRBC4/5 Check cbc in am  #3 YPP:[id number]  #4 Carotid Artery disease:Stable. No symptoms now  #5 RBBB: , Bradycardia   Monitor with telemetry. If hr continues to be low may need to stop Timolol  #6 Hypokalemia:repleted  #7 CKD stage PY:KDXIPJ BUN/Creatinine  #8 Hypomagnesemia: magnesium repleted. Recheck level  #9AAA:stable.  #10 Sundowning, AV hallucination Try to minimize pain medication Haldol 2mg  iv bid prn  #11 Severe protein calorie malnutrition Start on Prostat    .    Code Status:FULL CODE  Family Communication :  Disposition Plan:home  Barriers For Discharge:  Consults :surgery  Procedures : Lap Chole 4/4, CT abd, MRCP  DVT Prophylaxis: SCDs    Lab Results  Component Value Date   PLT 143 (L) 09/29/2017    Antibiotics  :    Meropenem4/2=>4/4 , Zosyn 4/4=>      Anti-infectives (From admission, onward)   Start     Dose/Rate Route Frequency Ordered Stop   09/26/17 2000  Ampicillin-Sulbactam (UNASYN) 3 g in sodium chloride 0.9 % 100 mL IVPB  Status:  Discontinued     3 g 200 mL/hr over 30 Minutes Intravenous Every 8 hours 09/26/17 1532 09/27/17 0738   09/26/17 1930  piperacillin-tazobactam (ZOSYN) IVPB 3.375 g     3.375 g 12.5 mL/hr over 240 Minutes Intravenous Every 8 hours 09/26/17 1553     09/24/17 1130  Ampicillin-Sulbactam (UNASYN) 3 g in sodium chloride 0.9 % 100 mL IVPB  Status:  Discontinued     3 g 200 mL/hr over 30 Minutes Intravenous Every 12 hours 09/24/17 1045 09/26/17 1532   09/24/17 1045  meropenem (MERREM) 500 mg in sodium chloride 0.9 % 100 mL IVPB  Status:   Discontinued     500 mg 200 mL/hr over 30 Minutes Intravenous Every 12 hours 09/24/17 1036 09/24/17 1045        Objective:   Vitals:   09/29/17 0535 09/29/17 1339 09/29/17 2223 09/30/17 0452  BP: (!) 158/47 (!) 126/50 (!) 139/55 (!) 151/58  Pulse: (!) 47 (!) 45 (!) 51 (!) 55  Resp: 17 15 16 16   Temp: 97.6 F (36.4 C) 97.7 F (36.5 C) 97.7 F (36.5 C) 98.3 F (36.8 C)  TempSrc: Oral Oral Oral   SpO2: 97% 98% 93% 94%  Weight: 82.9 kg (182 lb 12.2 oz)     Height:        Wt Readings from Last 3 Encounters:  09/29/17 82.9 kg (182 lb 12.2 oz)  07/15/17 77.2 kg (170 lb 3.2 oz)  07/10/17 81.6 kg (180 lb)     Intake/Output Summary (Last 24 hours) at 09/30/2017 0747 Last data filed at 09/30/2017 0453 Gross per 24 hour  Intake 1500 ml  Output 655 ml  Net 845 ml     Physical Exam  Awake Alert, Oriented X 3, No new F.N deficits, Normal affect [place].AT,PERRAL Supple Neck,No JVD, No cervical lymphadenopathy appriciated.  Symmetrical Chest wall movement, Good air movement bilaterally, CTAB RRR,No Gallops,Rubs or new Murmurs, No Parasternal Heave +ve B.Sounds, Abd Soft, No tenderness, No organomegaly appriciated, No rebound - guarding or rigidity. No Cyanosis, Clubbing or edema, No new Rash or bruise    Serous drainage (yellow), around JP    Data Review:    CBC Recent Labs  Lab 09/26/17 0614 09/26/17 1313 09/26/17 1635 09/27/17 8250 09/28/17 0001 09/29/17 0357  WBC 8.7  --  9.3 7.1 8.8 9.8  HGB 9.7* 9.5* 9.5* 7.9* 9.2* 9.6*  HCT 28.5* 28.0* 28.4* 24.3* 26.5* 28.2*  PLT 147*  --  143* 144* 126* 143*  MCV 91.9  --  90.2 90.0 86.9 87.9  MCH 31.3  --  30.2 29.3 30.2 29.9  MCHC 34.0  --  33.5 32.5 34.7 34.0  RDW 15.3  --  14.8 15.1 15.7* 15.9*    Chemistries  Recent Labs  Lab 09/23/17 1918  09/25/17 0403 09/26/17 0614 09/26/17 1313 09/27/17 0632 09/28/17 0001 09/29/17 0357  NA  --    < > 140 137 142 139 138 140  K  --    < > 4.0 4.0 4.2 4.3 4.0 4.0  CL  --     < > 105 107  --  108 110 110  CO2  --    < > 23 22  --  21* 19* 21*  GLUCOSE  --    < > 90 99 108* 144* 136* 116*  BUN  --    < > 37* 35*  --  36* 42* 43*  CREATININE  --    < > 2.61* 2.17*  --  2.16* 2.12* 2.17*  CALCIUM  --    < > 8.6* 8.7*  --  8.0* 8.2* 8.1*  MG 1.6*  --   --   --   --   --   --   --   AST  --    < > 116* 82*  --  49* 49* 50*  ALT  --    < > 63 54  --  37 34 33  ALKPHOS  --    < > 67 94  --  66 87 98  BILITOT  --    < > 4.3* 3.5*  --  2.2* 1.9* 1.7*   < > = values in this interval not displayed.   ------------------------------------------------------------------------------------------------------------------ No results for input(s): CHOL, HDL, LDLCALC, TRIG, CHOLHDL, LDLDIRECT in the last 72 hours.  Lab Results  Component Value Date   HGBA1C 4.9 07/15/2017   ------------------------------------------------------------------------------------------------------------------ Recent Labs    09/29/17 0805  TSH 3.166   ------------------------------------------------------------------------------------------------------------------ No results for input(s): VITAMINB12, FOLATE, FERRITIN, TIBC, IRON, RETICCTPCT in the last 72 hours.  Coagulation profile Recent Labs  Lab 09/23/17 1048  INR 1.17    No results for input(s): DDIMER in the last 72 hours.  Cardiac Enzymes No results for input(s): CKMB, TROPONINI, MYOGLOBIN in the last 168 hours.  Invalid input(s): CK ------------------------------------------------------------------------------------------------------------------ No results found for: BNP  Inpatient Medications  Scheduled Meds: . allopurinol  300 mg Oral Daily  . amLODipine  5 mg Oral Daily  . aspirin EC  81 mg Oral Daily  . brimonidine  1 drop Both Eyes BID  . cholecalciferol  5,000 Units Oral Daily  . dorzolamide-timolol  1 drop Both Eyes BID  . feeding supplement  1 Container Oral BID BM  . loteprednol  1 drop Right Eye BID  .  nepafenac  1 drop Right Eye Daily  . pantoprazole  40 mg Oral Daily   Continuous Infusions: . sodium chloride 100 mL/hr at 09/30/17 0730  . piperacillin-tazobactam (ZOSYN)  IV 3.375 g (09/30/17 0506)   PRN Meds:.acetaminophen, haloperidol lactate, ondansetron **OR** ondansetron (ZOFRAN) IV, traMADol  Micro Results Recent Results (from the past 240 hour(s))  Surgical pcr screen     Status: None   Collection Time: 09/25/17  8:45 PM  Result Value Ref Range Status   MRSA, PCR NEGATIVE NEGATIVE Final   Staphylococcus aureus NEGATIVE NEGATIVE Final    Comment: (NOTE) The Xpert SA Assay (FDA approved for NASAL specimens in patients 31 years of age and older), is one component of a comprehensive surveillance program. It is not intended to diagnose infection nor to guide or monitor treatment. Performed at [facility], [place] [address], [place], [place] [address]     Radiology Reports Ct Abdomen Pelvis Wo Contrast  Result Date: 09/23/2017 CLINICAL DATA:  Abdominal pain.  Increasing abdominal pain EXAM: CT ABDOMEN AND PELVIS WITHOUT CONTRAST TECHNIQUE: Multidetector CT imaging of the abdomen and pelvis was performed following the standard protocol without IV contrast. COMPARISON:  None. FINDINGS: Lower chest: Mild bibasilar atelectasis. Hepatobiliary: No focal hepatic lesion on noncontrast exam. Gallbladder distended to 5.5 cm. No radiodense gallstones are present. Common bile duct appears normal caliber. Small amount of free fluid along the RIGHT hepatic margin Pancreas: Pancreas is normal. No ductal dilatation. No pancreatic inflammation. Spleen: Normal spleen Adrenals/urinary tract: Adrenal glands normal. No nephrolithiasis or ureterolithiasis. Vascular calcification in the RIGHT renal hilum. No bladder calculi Stomach/Bowel: Stomach, small bowel, appendix, and cecum are normal. Multiple sigmoid diverticula. No acute diverticulitis. Vascular/Lymphatic: Abdominal aorta is normal caliber with  atherosclerotic calcification. There is no retroperitoneal or periportal lymphadenopathy. No pelvic lymphadenopathy. Reproductive: Prostate normal Other: No free fluid. Musculoskeletal: No aggressive osseous lesion. IMPRESSION: 1. Gallbladder distension without of evidence gallbladder inflammation. No radiodense gallstones. Consider ultrasound if concern for cholecystitis. 2. Small free fluid along the RIGHT hepatic lobe. 3. Extensive sigmoid diverticulosis without evidence diverticulitis. 4.  Aortic Atherosclerosis (ICD10-I70.0). Electronically Signed   By: Suzy Bouchard M.D.   On: 09/23/2017 15:33   Dg Chest 2 View  Result Date: 09/25/2017 CLINICAL DATA:  Chest pain. EXAM: CHEST - 2 VIEW COMPARISON:  Radiograph of September 23, 2017. FINDINGS: Stable cardiomediastinal silhouette. No pneumothorax is noted. Mild bibasilar subsegmental atelectasis is noted with minimal bilateral pleural effusions. Bony thorax is unremarkable. IMPRESSION: Mild bibasilar subsegmental atelectasis with minimal bilateral pleural effusions. Electronically Signed   By: Marijo Conception, M.D.   On: 09/25/2017 16:30   Mr Abdomen Mrcp Wo Contrast  Result Date: 09/24/2017 CLINICAL DATA:  Inpatient. Epigastric/right upper quadrant abdominal pain. Abnormal liver function tests. Cholelithiasis. EXAM: MRI ABDOMEN WITHOUT CONTRAST  (INCLUDING MRCP) TECHNIQUE: Multiplanar multisequence MR imaging of the abdomen was performed. Heavily T2-weighted images of the biliary and pancreatic ducts were obtained, and three-dimensional MRCP images were rendered by post processing. COMPARISON:  09/23/2017 unenhanced CT abdomen/pelvis and right upper quadrant abdominal sonogram. FINDINGS: Lower chest: Hypoventilatory changes in the dependent lung bases bilaterally. Hepatobiliary: Normal liver size and configuration. No hepatic steatosis. There is a sub 5 mm T2 hyperintense posterior right liver lobe lesion, incompletely characterized on this noncontrast scan,  for which no follow-up is required unless the patient has risk factors for liver malignancy. No additional liver lesions. Distended gallbladder (5.3 cm diameter). Layering sludge and tiny 2-3 mm gallstones in the gallbladder. Borderline mild diffuse gallbladder wall thickening (gallbladder wall thickness 3 mm). No pericholecystic fluid. No biliary ductal dilatation. Common bile duct diameter 6 mm. Small amount of layering sludge in the common bile duct, with no filling defects in the common bile duct to suggest choledocholithiasis. Small periampullary duodenal diverticulum. Pancreas: There is a 0.8 cm cystic pancreatic body lesion (series 13001/image 52) without appreciable wall thickening or internal complexity. No additional pancreatic lesions. No pancreatic duct  dilation. No convincing pancreas divisum (the main pancreatic duct is drained by an accessory duct of [place], however also appears to communicate with the common bile duct). Spleen: Normal size. No mass. Adrenals/Urinary Tract: No adrenal nodules. No hydronephrosis. Normal kidneys with no renal mass. Stomach/Bowel: Grossly normal stomach. Visualized small and large bowel is normal caliber, with no bowel wall thickening. Vascular/Lymphatic: Infrarenal 4.7 cm abdominal aortic aneurysm, stable. No pathologically enlarged lymph nodes in the abdomen. Other: Trace perihepatic ascites.  No focal fluid collection. Musculoskeletal: No aggressive appearing focal osseous lesions. Bilateral posterior spinal fusion hardware is visualized in the lower lumbar spine. IMPRESSION: 1. Layering sludge and tiny gallstones in the distended gallbladder with borderline mild diffuse gallbladder wall thickening. No pericholecystic fluid. These findings are nonspecific and could be compatible with acute cholecystitis in the correct clinical setting. 2. No biliary ductal dilatation. CBD diameter 6 mm. Small amount of layering sludge in the CBD, with no evidence of  choledocholithiasis. 3. 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 Follow-up MRI abdomen without and with IV contrast recommended in 2 years. This recommendation follows ACR consensus guidelines: Management of Incidental Pancreatic Cysts: A White Paper of the ACR Incidental Findings Committee. [place] 6301;60:109-323. 4. Infrarenal 4.7 cm abdominal aortic aneurysm. Recommend followup by abdomen and pelvis CTA in 6 months, and vascular surgery referral/consultation if not already obtained. This recommendation follows ACR consensus guidelines: White Paper of the ACR Incidental Findings Committee II on Vascular Findings. J Am Coll Radiol 2013; 10:789-794. 5. Trace perihepatic ascites. Electronically Signed   By: Ilona Sorrel M.D.   On: 09/24/2017 18:09   Mr 3d Recon At Scanner  Result Date: 09/24/2017 CLINICAL DATA:  Inpatient. Epigastric/right upper quadrant abdominal pain. Abnormal liver function tests. Cholelithiasis. EXAM: MRI ABDOMEN WITHOUT CONTRAST  (INCLUDING MRCP) TECHNIQUE: Multiplanar multisequence MR imaging of the abdomen was performed. Heavily T2-weighted images of the biliary and pancreatic ducts were obtained, and three-dimensional MRCP images were rendered by post processing. COMPARISON:  09/23/2017 unenhanced CT abdomen/pelvis and right upper quadrant abdominal sonogram. FINDINGS: Lower chest: Hypoventilatory changes in the dependent lung bases bilaterally. Hepatobiliary: Normal liver size and configuration. No hepatic steatosis. There is a sub 5 mm T2 hyperintense posterior right liver lobe lesion, incompletely characterized on this noncontrast scan, for which no follow-up is required unless the patient has risk factors for liver malignancy. No additional liver lesions. Distended gallbladder (5.3 cm diameter). Layering sludge and tiny 2-3 mm gallstones in the gallbladder. Borderline mild diffuse gallbladder wall thickening (gallbladder wall  thickness 3 mm). No pericholecystic fluid. No biliary ductal dilatation. Common bile duct diameter 6 mm. Small amount of layering sludge in the common bile duct, with no filling defects in the common bile duct to suggest choledocholithiasis. Small periampullary duodenal diverticulum. Pancreas: There is a 0.8 cm cystic pancreatic body lesion (series 13001/image 52) without appreciable wall thickening or internal complexity. No additional pancreatic lesions. No pancreatic duct dilation. No convincing pancreas divisum (the main pancreatic duct is drained by an accessory duct of [place], however also appears to communicate with the common bile duct). Spleen: Normal size. No mass. Adrenals/Urinary Tract: No adrenal nodules. No hydronephrosis. Normal kidneys with no renal mass. Stomach/Bowel: Grossly normal stomach. Visualized small and large bowel is normal caliber, with no bowel wall thickening. Vascular/Lymphatic: Infrarenal 4.7 cm abdominal aortic aneurysm, stable. No pathologically enlarged lymph nodes in the abdomen. Other: Trace perihepatic ascites.  No focal fluid collection. Musculoskeletal: No aggressive appearing  focal osseous lesions. Bilateral posterior spinal fusion hardware is visualized in the lower lumbar spine. IMPRESSION: 1. Layering sludge and tiny gallstones in the distended gallbladder with borderline mild diffuse gallbladder wall thickening. No pericholecystic fluid. These findings are nonspecific and could be compatible with acute cholecystitis in the correct clinical setting. 2. No biliary ductal dilatation. CBD diameter 6 mm. Small amount of layering sludge in the CBD, with no evidence of choledocholithiasis. 3. 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 Follow-up MRI abdomen without and with IV contrast recommended in 2 years. This recommendation follows ACR consensus guidelines: Management of Incidental Pancreatic Cysts: A White Paper of the ACR  Incidental Findings Committee. [place] 0240;97:353-299. 4. Infrarenal 4.7 cm abdominal aortic aneurysm. Recommend followup by abdomen and pelvis CTA in 6 months, and vascular surgery referral/consultation if not already obtained. This recommendation follows ACR consensus guidelines: White Paper of the ACR Incidental Findings Committee II on Vascular Findings. J Am Coll Radiol 2013; 10:789-794. 5. Trace perihepatic ascites. Electronically Signed   By: Ilona Sorrel M.D.   On: 09/24/2017 18:09   US Abdomen Limited  Result Date: 09/23/2017 CLINICAL DATA:  Right upper quadrant pain for several days EXAM: ULTRASOUND ABDOMEN LIMITED RIGHT UPPER QUADRANT COMPARISON:  CT from earlier in the same day. FINDINGS: Gallbladder: Well distended with gallstones and sludge. No pericholecystic fluid or gallbladder wall thickening is noted. Negative sonographic Percell Miller sign is noted. Common bile duct: Diameter: 5.2 mm. Liver: No focal lesion identified. Within normal limits in parenchymal echogenicity. Portal vein is patent on color Doppler imaging with normal direction of blood flow towards the liver. IMPRESSION: Cholelithiasis without definitive complicating factors. This may still be the etiology of the patient's discomfort. Electronically Signed   By: Inez Catalina M.D.   On: 09/23/2017 16:49   Dg Chest Portable 1 View  Result Date: 09/23/2017 CLINICAL DATA:  Shortness of breath and pain EXAM: PORTABLE CHEST 1 VIEW COMPARISON:  June 27, 2017 FINDINGS: There is no edema or consolidation. Heart size and pulmonary vascularity are normal. No adenopathy. No pneumothorax. There is aortic atherosclerosis. IMPRESSION: Aortic atherosclerosis.  No edema or consolidation. Aortic Atherosclerosis (ICD10-I70.0). Electronically Signed   By: Lowella Grip III M.D.   On: 09/23/2017 11:23    Time Spent in minutes  30   Jani Gravel M.D on 09/30/2017 at 7:47 AM  Between 7am to 7pm - Pager - [phone]    After 7pm go to  [email or web address] - password [facility]  [facility] -  Office  [phone]

## 2017-09-30 NOTE — Care Management Note (Signed)
Case Management Note  [name] MRN: [id number] Date of Birth: 10-07-1940  Subjective/Objective:                    Action/Plan:  Spoke to patient and wife at bedside regarding home health RN for drain care. Both aware HHRN will not be present when drain has to be emptied, recorded and re charged. Bedside nurse will begin teaching here before discharge and [facility] will continue at home. Both voiced understanding .    Expected Discharge Date:                  Expected Discharge Plan:  [place]  In-House Referral:  NA  Discharge planning Services  CM Consult  Post Acute Care Choice:  Home Health Choice offered to:  Spouse, Patient  DME Arranged:  N/A DME Agency:  NA  HH Arranged:  RN [place] Agency:  [place]  Status of Service:  Completed, signed off  If discussed at [place] of Stay Meetings, dates discussed:    Additional Comments:  Marilu Favre, RN 09/30/2017, 10:06 AM

## 2017-09-30 NOTE — Progress Notes (Signed)
Physical Therapy Treatment Patient Details Name: Terry Garner MRN: [id number] DOB: 1940/08/05 Today's Date: 09/30/2017    History of Present Illness 77yo male with complaints of epigastric pain, RUQ pain, and nausea/vomiting. He received laparoscopic cholecystectomy on 09/26/17. PMH AAA, cataracts/low vision, CKD, glaucoma, gout, HTN, lumbar and cervical surgeries, hernia repair, shoulder arthroscopy, THA     PT Comments    Patient continues to make progress toward PT goals. Pt tolerated increased gait distance with use of SPC. Continue to progress as tolerated.    Follow Up Recommendations  No PT follow up     Equipment Recommendations  None recommended by PT    Recommendations for Other Services       Precautions / Restrictions Precautions Precautions: Other (comment) Precaution Comments: reduced vision, abdominal drain Restrictions Weight Bearing Restrictions: No    Mobility  Bed Mobility                  Transfers Overall transfer level: Needs assistance Equipment used: None Transfers: Sit to/from Stand Sit to Stand: Min guard         General transfer comment: cues for safe hand placement; pt with several attempts to stand prior to standing  Ambulation/Gait Ambulation/Gait assistance: Supervision;Min guard Ambulation Distance (Feet): 400 Feet Assistive device: Straight cane Gait Pattern/deviations: Step-through pattern;Decreased stride length;Trunk flexed     General Gait Details: cues for sequencing and safe use of AD as well as posture   Stairs            Wheelchair Mobility    Modified Rankin (Stroke Patients Only)       Balance Overall balance assessment: Needs assistance Sitting-balance support: Feet supported Sitting balance-Leahy Scale: Good     Standing balance support: Single extremity supported;During functional activity Standing balance-Leahy Scale: Fair                              Cognition  Arousal/Alertness: Awake/alert Behavior During Therapy: WFL for tasks assessed/performed Overall Cognitive Status: Within Functional Limits for tasks assessed                                        Exercises      General Comments General comments (skin integrity, edema, etc.): family present in room      Pertinent Vitals/Pain Pain Assessment: Faces Faces Pain Scale: Hurts a little bit Pain Location: abdomen with mobility Pain Descriptors / Indicators: Sore Pain Intervention(s): Monitored during session;Repositioned    Home Living                      Prior Function            PT Goals (current goals can now be found in the care plan section) Acute Rehab PT Goals Patient Stated Goal: to go home  PT Goal Formulation: With patient/family Time For Goal Achievement: 10/11/17 Potential to Achieve Goals: Good Progress towards PT goals: Progressing toward goals    Frequency    Min 3X/week      PT Plan Current plan remains appropriate    Co-evaluation              AM-PAC PT "6 Clicks" Daily Activity  Outcome Measure  Difficulty turning over in bed (including adjusting bedclothes, sheets and blankets)?: None Difficulty moving from lying on back to sitting on  the side of the bed? : None Difficulty sitting down on and standing up from a chair with arms (e.g., wheelchair, bedside commode, etc,.)?: Unable Help needed moving to and from a bed to chair (including a wheelchair)?: None Help needed walking in hospital room?: None Help needed climbing 3-5 steps with a railing? : None 6 Click Score: 21    End of Session Equipment Utilized During Treatment: Gait belt Activity Tolerance: Patient tolerated treatment well Patient left: with call bell/phone within reach;with family/visitor present;in bed;with SCD's reapplied Nurse Communication: Mobility status PT Visit Diagnosis: Muscle weakness (generalized) (M62.81);History of falling (Z91.81)      Time: 7510-2585 PT Time Calculation (min) (ACUTE ONLY): 20 min  Charges:  $Gait Training: 8-22 mins                    G Codes:       Earney Navy, PTA Pager: [phone]     Darliss Cheney 09/30/2017, 3:30 PM

## 2017-09-30 NOTE — Progress Notes (Signed)
[facility] Progress Note  4 Days Post-Op  Subjective: CC:  C/o some drainage around JP. Pain is controlled w/ POs. Mobilizing with walker. Urinating and having bowel movements.   Objective: Vital signs in last 24 hours: Temp:  [97.7 F (36.5 C)-98.3 F (36.8 C)] 98.3 F (36.8 C) (04/08 0452) Pulse Rate:  [45-55] 55 (04/08 0452) Resp:  [15-16] 16 (04/08 0452) BP: (126-151)/(50-58) 151/58 (04/08 0452) SpO2:  [93 %-98 %] 94 % (04/08 0452) Last BM Date: 09/29/17  Intake/Output from previous day: 04/07 0701 - 04/08 0700 In: 1500 [P.O.:700; I.V.:800] Out: 655 [Urine:350; Drains:305] Intake/Output this shift: No intake/output data recorded.  PE: Gen:  Alert, NAD, pleasant Abd: Soft, non-tender, non-distended, bowel sounds present in all 4 quadrants, incisions c/d/o w steris. JP dressing with some serous drainage on gauze, JP bulb with SS drainage - no bile.  Lab Results:  Recent Labs    09/28/17 0001 09/29/17 0357  WBC 8.8 9.8  HGB 9.2* 9.6*  HCT 26.5* 28.2*  PLT 126* 143*   BMET Recent Labs    09/28/17 0001 09/29/17 0357  NA 138 140  K 4.0 4.0  CL 110 110  CO2 19* 21*  GLUCOSE 136* 116*  BUN 42* 43*  CREATININE 2.12* 2.17*  CALCIUM 8.2* 8.1*   PT/INR No results for input(s): LABPROT, INR in the last 72 hours. CMP     Component Value Date/Time   NA 140 09/29/2017 0357   K 4.0 09/29/2017 0357   CL 110 09/29/2017 0357   CO2 21 (L) 09/29/2017 0357   GLUCOSE 116 (H) 09/29/2017 0357   BUN 43 (H) 09/29/2017 0357   CREATININE 2.17 (H) 09/29/2017 0357   CREATININE 2.80 (H) 07/15/2017 1405   CALCIUM 8.1 (L) 09/29/2017 0357   PROT 4.0 (L) 09/29/2017 0357   ALBUMIN 1.7 (L) 09/29/2017 0357   AST 50 (H) 09/29/2017 0357   ALT 33 09/29/2017 0357   ALKPHOS 98 09/29/2017 0357   BILITOT 1.7 (H) 09/29/2017 0357   GFRNONAA 27 (L) 09/29/2017 0357   GFRNONAA 21 (L) 07/15/2017 1405   GFRAA 32 (L) 09/29/2017 0357   GFRAA 24 (L) 07/15/2017 1405   Lipase      Component Value Date/Time   LIPASE 17 09/24/2017 0343       Studies/Results: No results found.  Anti-infectives: Anti-infectives (From admission, onward)   Start     Dose/Rate Route Frequency Ordered Stop   09/26/17 2000  Ampicillin-Sulbactam (UNASYN) 3 g in sodium chloride 0.9 % 100 mL IVPB  Status:  Discontinued     3 g 200 mL/hr over 30 Minutes Intravenous Every 8 hours 09/26/17 1532 09/27/17 0738   09/26/17 1930  piperacillin-tazobactam (ZOSYN) IVPB 3.375 g     3.375 g 12.5 mL/hr over 240 Minutes Intravenous Every 8 hours 09/26/17 1553     09/24/17 1130  Ampicillin-Sulbactam (UNASYN) 3 g in sodium chloride 0.9 % 100 mL IVPB  Status:  Discontinued     3 g 200 mL/hr over 30 Minutes Intravenous Every 12 hours 09/24/17 1045 09/26/17 1532   09/24/17 1045  meropenem (MERREM) 500 mg in sodium chloride 0.9 % 100 mL IVPB  Status:  Discontinued     500 mg 200 mL/hr over 30 Minutes Intravenous Every 12 hours 09/24/17 1036 09/24/17 1045       Assessment/Plan Acute cholecystitis S/P LAPAROSCOPIC SUBTOTAL CHOLECYSTECTOMY, JP DRAIN PLACEMENT 4/4 MW - POD#4 - LFT's trending down, no leukocytosis, VSS  - JP drain 305 cc/24h SS  -  pain controlled, tolerating PO, mobilizing and no PT follow up necessary  - nursing staff to assist wife with drain care today so she is comfortable managing at home.  ID: Unasyn 4/2-4/4, Zosyn 4/4 >> (day#6)  FEN: IVF, Regular diet  VTE: SCD's, will discuss resumption of anticoagulation with MD; hgb was stable yesterday, today's CBC is pending.  Foley: none  Follow up: Dr. Rolm Bookbinder   Dispo: hgb has been stable for 48h. Probably ok to resume anticoagulation but will confirm with attending physician. From a surgical perspective, the patient is stable for discharge home with JP drain in place. Follow up with Dr. Rolm Bookbinder in one week.    LOS: 6 days    Jill Alexanders , [facility] 09/30/2017, 7:43 AM Pager:  [phone] Consults: [phone] Mon-Fri 7:00 am-4:30 pm Sat-Sun 7:00 am-11:30 am

## 2017-10-01 ENCOUNTER — Telehealth: Payer: Self-pay | Admitting: *Deleted

## 2017-10-01 LAB — COMPREHENSIVE METABOLIC PANEL
ALBUMIN: 1.5 g/dL — AB (ref 3.5–5.0)
ALT: 31 U/L (ref 17–63)
ANION GAP: 8 (ref 5–15)
AST: 50 U/L — ABNORMAL HIGH (ref 15–41)
Alkaline Phosphatase: 123 U/L (ref 38–126)
BUN: 32 mg/dL — ABNORMAL HIGH (ref 6–20)
CHLORIDE: 112 mmol/L — AB (ref 101–111)
CO2: 19 mmol/L — AB (ref 22–32)
Calcium: 7.8 mg/dL — ABNORMAL LOW (ref 8.9–10.3)
Creatinine, Ser: 2.21 mg/dL — ABNORMAL HIGH (ref 0.61–1.24)
GFR calc Af Amer: 31 mL/min — ABNORMAL LOW (ref >60)
GFR calc non Af Amer: 27 mL/min — ABNORMAL LOW (ref >60)
GLUCOSE: 98 mg/dL (ref 65–99)
POTASSIUM: 3.9 mmol/L (ref 3.5–5.1)
SODIUM: 139 mmol/L (ref 135–145)
Total Bilirubin: 2.3 mg/dL — ABNORMAL HIGH (ref 0.3–1.2)
Total Protein: 4.1 g/dL — ABNORMAL LOW (ref 6.5–8.1)

## 2017-10-01 LAB — CBC
HCT: 28.9 % — ABNORMAL LOW (ref 39.0–52.0)
HEMOGLOBIN: 9.4 g/dL — AB (ref 13.0–17.0)
MCH: 28.9 pg (ref 26.0–34.0)
MCHC: 32.5 g/dL (ref 30.0–36.0)
MCV: 88.9 fL (ref 78.0–100.0)
PLATELETS: 182 10*3/uL (ref 150–400)
RBC: 3.25 MIL/uL — ABNORMAL LOW (ref 4.22–5.81)
RDW: 16.1 % — ABNORMAL HIGH (ref 11.5–15.5)
WBC: 10.7 10*3/uL — ABNORMAL HIGH (ref 4.0–10.5)

## 2017-10-01 MED ORDER — CLOPIDOGREL BISULFATE 75 MG PO TABS
75.0000 mg | ORAL_TABLET | Freq: Every day | ORAL | Status: DC
  Administered 2017-10-01: 75 mg via ORAL
  Filled 2017-10-01: qty 1

## 2017-10-01 MED ORDER — TRAMADOL HCL 50 MG PO TABS: 50.0000 mg | ORAL_TABLET | Freq: Two times a day (BID) | ORAL | 0 refills | Status: DC | PRN

## 2017-10-01 NOTE — Progress Notes (Signed)
[facility] Progress Note  5 Days Post-Op  Subjective: CC:  No new complaints. Pain controlled. Tolerating PO. Mobilizing. Having bowel function.  Objective: Vital signs in last 24 hours: Temp:  [98.3 F (36.8 C)-99.6 F (37.6 C)] 99.6 F (37.6 C) (04/09 0511) Pulse Rate:  [59-68] 64 (04/09 0511) Resp:  [17-19] 17 (04/09 0511) BP: (143-155)/(53-65) 149/65 (04/09 0511) SpO2:  [95 %] 95 % (04/09 0511) Weight:  [90 kg (198 lb 6.6 oz)] 90 kg (198 lb 6.6 oz) (04/09 0500) Last BM Date: 09/30/17  Intake/Output from previous day: 04/08 0701 - 04/09 0700 In: 1410 [P.O.:460; I.V.:700; IV Piggyback:250] Out: 405 [Urine:100; Drains:305] Intake/Output this shift: No intake/output data recorded.  PE: Gen:  Alert, NAD, pleasant Abd: Soft, non-tender, non-distended, bowel sounds present in all 4 quadrants, incisions c/d/i w steris. JP dressing with some serous drainage on gauze, JP bulb with SS and bile-tinged drainage.   Lab Results:  Recent Labs    09/30/17 0707 10/01/17 0356  WBC 8.4 10.7*  HGB 9.2* 9.4*  HCT 27.3* 28.9*  PLT 147* 182   BMET Recent Labs    09/30/17 0707 10/01/17 0356  NA 139 139  K 3.8 3.9  CL 112* 112*  CO2 18* 19*  GLUCOSE 87 98  BUN 38* 32*  CREATININE 2.16* 2.21*  CALCIUM 7.7* 7.8*   PT/INR No results for input(s): LABPROT, INR in the last 72 hours. CMP     Component Value Date/Time   NA 139 10/01/2017 0356   K 3.9 10/01/2017 0356   CL 112 (H) 10/01/2017 0356   CO2 19 (L) 10/01/2017 0356   GLUCOSE 98 10/01/2017 0356   BUN 32 (H) 10/01/2017 0356   CREATININE 2.21 (H) 10/01/2017 0356   CREATININE 2.80 (H) 07/15/2017 1405   CALCIUM 7.8 (L) 10/01/2017 0356   PROT 4.1 (L) 10/01/2017 0356   ALBUMIN 1.5 (L) 10/01/2017 0356   AST 50 (H) 10/01/2017 0356   ALT 31 10/01/2017 0356   ALKPHOS 123 10/01/2017 0356   BILITOT 2.3 (H) 10/01/2017 0356   GFRNONAA 27 (L) 10/01/2017 0356   GFRNONAA 21 (L) 07/15/2017 1405   GFRAA 31 (L)  10/01/2017 0356   GFRAA 24 (L) 07/15/2017 1405   Lipase     Component Value Date/Time   LIPASE 17 09/24/2017 0343       Studies/Results: No results found.  Anti-infectives: Anti-infectives (From admission, onward)   Start     Dose/Rate Route Frequency Ordered Stop   09/26/17 2000  Ampicillin-Sulbactam (UNASYN) 3 g in sodium chloride 0.9 % 100 mL IVPB  Status:  Discontinued     3 g 200 mL/hr over 30 Minutes Intravenous Every 8 hours 09/26/17 1532 09/27/17 0738   09/26/17 1930  piperacillin-tazobactam (ZOSYN) IVPB 3.375 g     3.375 g 12.5 mL/hr over 240 Minutes Intravenous Every 8 hours 09/26/17 1553     09/24/17 1130  Ampicillin-Sulbactam (UNASYN) 3 g in sodium chloride 0.9 % 100 mL IVPB  Status:  Discontinued     3 g 200 mL/hr over 30 Minutes Intravenous Every 12 hours 09/24/17 1045 09/26/17 1532   09/24/17 1045  meropenem (MERREM) 500 mg in sodium chloride 0.9 % 100 mL IVPB  Status:  Discontinued     500 mg 200 mL/hr over 30 Minutes Intravenous Every 12 hours 09/24/17 1036 09/24/17 1045       Assessment/Plan Acute cholecystitis S/P LAPAROSCOPIC SUBTOTAL CHOLECYSTECTOMY, JP DRAIN PLACEMENT 4/4 MW - POD#5 - LFT's stable, no leukocytosis,  VSS  - JP drain 305 cc/24h SS/some bile tinged fluid; not unexpected given subtotal cholecystectomy. This can be followed in our office - pain controlled, tolerating PO, mobilizing and no PT follow up necessary    ID: Unasyn 4/2-4/4, Zosyn 4/4 >> (day#6)  FEN: IVF, Regular diet  VTE: SCD's, Plavix resumed 4/8 Foley: none  Follow up: Dr. Rolm Bookbinder   Dispo: stable for discharge home with drain in place. Will need to see Dr. [name] in one week for follow up and possible changes in drain management. Patient advised to cal our office or go to ED if abdominal pain becomes acutely worse or he becomes unable to tolerate PO intake.    LOS: 7 days    [facility] 10/01/2017, 8:32  AM Pager: [phone] Consults: [phone] Mon-Fri 7:00 am-4:30 pm Sat-Sun 7:00 am-11:30 am

## 2017-10-01 NOTE — Telephone Encounter (Signed)
ERROR

## 2017-10-01 NOTE — Discharge Summary (Signed)
Terry Garner, is a 77 y.o. male  DOB [birth year]  MRN [id number].  Admission date:  09/23/2017  Admitting Physician  Phillips Grout, MD  Discharge Date:  10/01/2017   Primary MD  [name], MD  Recommendations for primary care physician for things to follow:     #1. Cholidocholithiasis with Acute Cholecystitis: S/P Lap Chole.4/4 Cont tramadol 50mg  po bid prn abdominal pain F/u with surgery in 1 week  #2.Post-Op anemia: Hb has dropped from 9.5 to 7.9 Postop. Transfuse 2 units of PRBC4/5 Pcp to please check cbc in 1 week  #3 JAS:[id number]  #4 Carotid Artery disease:Stable. No symptoms now  #5 RBBB:,stable  #6 Hypokalemia:repleted  #7 CKD stage LP:FXTKWI BUN/Creatinine  #8 Hypomagnesemia: magnesium repleted.  Pcp to please check magnesium level and comp in 1 week  #9AAA:stable.  #10 Sundowning, AV hallucination => thought to be secondary to pain medication, resolved  #11 Severe protein calorie malnutrition       Admission Diagnosis  Calculus of gallbladder without cholecystitis without obstruction [K80.20]   Discharge Diagnosis  Calculus of gallbladder without cholecystitis without obstruction [K80.20]       Principal Problem:   Choledocholithiasis with acute cholecystitis Active Problems:   Carotid artery disease (HCC)   Right bundle branch block   Essential hypertension   Abdominal aortic aneurysm (HCC)   Preoperative cardiovascular examination   CKD (chronic kidney disease) stage 4, GFR 15-29 ml/min (HCC)   Abdominal pain   Acute cholecystitis      Past Medical History:  Diagnosis Date  . AAA (abdominal aortic aneurysm) (HCC)    3.6 cm (01/09/12 ultrasound)  . Allergy   . Arthritis   . Carotid artery disease ([place])   . Cataract   . Chronic kidney disease    CKD, right renal artery stenosis  . Coronary artery disease    mild left  internal carotid artery stenosis  . Glaucoma   . Gout   . Heart murmur   . Hyperlipidemia   . Hypertension   . Tubular adenoma of colon 2003   Dr. Watt Climes  . Vitamin D deficiency     Past Surgical History:  Procedure Laterality Date  . BACK SURGERY    . CARDIAC CATHETERIZATION     stents  last -07  . CARDIAC STENTS    . CERVICAL DISC SURGERY    . CHOLECYSTECTOMY N/A 09/26/2017   Procedure: LAPAROSCOPIC CHOLECYSTECTOMY;  Surgeon: Rolm Bookbinder, MD;  Location: [facility];  Service: General;  Laterality: N/A;  . EYE SURGERY Left 06   retinal detachment  . HERNIA REPAIR Right 93  . LUMBAR LAMINECTOMY  06/06/2012   Procedure: MICRODISCECTOMY LUMBAR LAMINECTOMY;  Surgeon: Marybelle Killings, MD;  Location: [facility];  Service: Orthopedics;  Laterality: Left;  Left L4-5 Microdiscectomy  . SHOULDER ARTHROSCOPY Right   . TOTAL HIP ARTHROPLASTY Left        HPI  from the history and physical done on the day of admission:    76  y.o.malewith medical history significant ofAAA, chronic kidney disease, coronary artery disease comes in with about 2 days of worsening epigastric pain and right upper quadrant abdominal pain. Patient has been vomiting and very nauseous with it. He denies any fevers. He denies any diarrhea. He denies any association with food. He still has his gallbladder. Patient's feeling better after some IV morphine given in the ED. Patient referred for admission for possible acute cholecystitis. General surgery has been called and will be seen the patient.          Hospital Course:   Surgery consulted. MRCP showed cholelithiasis.   Empirically started on unasyn in ED, and then transitioned to Zosyn.  Plavix was held.  Pt was seen and had lap chole 09/26/2017. Surgical pathology showed acute and chronic cholecystitis.   Pt received 2 units prbc for post-op anemia with good results.  Magnesium was low at 1.6 and this was repleted.   There was some concern regarding drainage  around the JP that was placed.  Per surgery draining fine and there is no drainage around JP this am.  Hgb has been stable, and plavix restarted. Pt did have some visual hallucinations, thought to be secondary to pain medication during this admission.  Resolved.   Pt will be discharted to home in stable condition.   Follow UP  Follow-up Information    Rolm Bookbinder, MD Follow up in 1 week(s).   Specialty:  General Surgery Contact information: [address] [place] [address] [phone]        [facility] - Dme Follow up.   Why:  Provide home health nurse  Contact information: [place] [address] [phone]        [name], MD Follow up in 1 week(s).   Specialty:  Internal Medicine Contact information: [address] [place] [address] [phone]        [name], MD .   Specialties:  Cardiology, Radiology Contact information: [address] [place] [address] [phone]            Consults obtained - surgery  Discharge Condition: stable  Diet and Activity recommendation: See Discharge Instructions below  Discharge Instructions      Discharge Medications     Allergies as of 10/01/2017      Reactions   Toprol Xl [metoprolol Tartrate] Other (See Comments)   Bradycardia with beta blockers   Lipitor [atorvastatin]    Myalgias   Coumadin [warfarin Sodium] Rash   Tape Rash   Clear tape   Warfarin Rash      Medication List    TAKE these medications   allopurinol 300 MG tablet Commonly known as:  ZYLOPRIM Take 300 mg by mouth daily.   amLODipine 5 MG tablet Commonly known as:  NORVASC Take 5 mg by mouth daily.   aspirin EC 81 MG tablet Take 81 mg by mouth daily.   brimonidine 0.15 % ophthalmic solution Commonly known as:  ALPHAGAN Place 1 drop into both eyes 2 (two) times daily.   clopidogrel 75 MG tablet Commonly known as:   PLAVIX TAKE 1 TABLET BY MOUTH ONCE DAILY FOR HEART STENTS   dorzolamide-timolol 22.3-6.8 MG/ML ophthalmic solution Commonly known as:  COSOPT Place 1 drop into both eyes 2 (two) times daily.   fenofibrate micronized 134 MG capsule Commonly known as:  LOFIBRA TAKE ONE CAPSULE BY MOUTH ONCE DAILY   furosemide 20 MG tablet Commonly known  as:  LASIX Take 40 mg by mouth daily.   furosemide 40 MG tablet Commonly known as:  LASIX Take 40 mg by mouth daily.   ILEVRO 0.3 % ophthalmic suspension Generic drug:  nepafenac Place 1 drop into the right eye daily.   LOTEMAX 0.5 % ophthalmic suspension Generic drug:  loteprednol Place 1 drop into the right eye 2 (two) times daily.   pravastatin 40 MG tablet Commonly known as:  PRAVACHOL TAKE ONE TABLET BY MOUTH AT BEDTIME FOR  CHOLESTEROL   traMADol 50 MG tablet Commonly known as:  ULTRAM Take 1 tablet (50 mg total) by mouth every 12 (twelve) hours as needed for severe pain.   Vitamin D3 5000 units Tabs Take 5,000 Units by mouth daily.       Major procedures and Radiology Reports - PLEASE review detailed and final reports for all details, in brief -       Ct Abdomen Pelvis Wo Contrast  Result Date: 09/23/2017 CLINICAL DATA:  Abdominal pain.  Increasing abdominal pain EXAM: CT ABDOMEN AND PELVIS WITHOUT CONTRAST TECHNIQUE: Multidetector CT imaging of the abdomen and pelvis was performed following the standard protocol without IV contrast. COMPARISON:  None. FINDINGS: Lower chest: Mild bibasilar atelectasis. Hepatobiliary: No focal hepatic lesion on noncontrast exam. Gallbladder distended to 5.5 cm. No radiodense gallstones are present. Common bile duct appears normal caliber. Small amount of free fluid along the RIGHT hepatic margin Pancreas: Pancreas is normal. No ductal dilatation. No pancreatic inflammation. Spleen: Normal spleen Adrenals/urinary tract: Adrenal glands normal. No nephrolithiasis or ureterolithiasis. Vascular  calcification in the RIGHT renal hilum. No bladder calculi Stomach/Bowel: Stomach, small bowel, appendix, and cecum are normal. Multiple sigmoid diverticula. No acute diverticulitis. Vascular/Lymphatic: Abdominal aorta is normal caliber with atherosclerotic calcification. There is no retroperitoneal or periportal lymphadenopathy. No pelvic lymphadenopathy. Reproductive: Prostate normal Other: No free fluid. Musculoskeletal: No aggressive osseous lesion. IMPRESSION: 1. Gallbladder distension without of evidence gallbladder inflammation. No radiodense gallstones. Consider ultrasound if concern for cholecystitis. 2. Small free fluid along the RIGHT hepatic lobe. 3. Extensive sigmoid diverticulosis without evidence diverticulitis. 4.  Aortic Atherosclerosis (ICD10-I70.0). Electronically Signed   By: Suzy Bouchard M.D.   On: 09/23/2017 15:33   Dg Chest 2 View  Result Date: 09/25/2017 CLINICAL DATA:  Chest pain. EXAM: CHEST - 2 VIEW COMPARISON:  Radiograph of September 23, 2017. FINDINGS: Stable cardiomediastinal silhouette. No pneumothorax is noted. Mild bibasilar subsegmental atelectasis is noted with minimal bilateral pleural effusions. Bony thorax is unremarkable. IMPRESSION: Mild bibasilar subsegmental atelectasis with minimal bilateral pleural effusions. Electronically Signed   By: Marijo Conception, M.D.   On: 09/25/2017 16:30   Mr Abdomen Mrcp Wo Contrast  Result Date: 09/24/2017 CLINICAL DATA:  Inpatient. Epigastric/right upper quadrant abdominal pain. Abnormal liver function tests. Cholelithiasis. EXAM: MRI ABDOMEN WITHOUT CONTRAST  (INCLUDING MRCP) TECHNIQUE: Multiplanar multisequence MR imaging of the abdomen was performed. Heavily T2-weighted images of the biliary and pancreatic ducts were obtained, and three-dimensional MRCP images were rendered by post processing. COMPARISON:  09/23/2017 unenhanced CT abdomen/pelvis and right upper quadrant abdominal sonogram. FINDINGS: Lower chest: Hypoventilatory  changes in the dependent lung bases bilaterally. Hepatobiliary: Normal liver size and configuration. No hepatic steatosis. There is a sub 5 mm T2 hyperintense posterior right liver lobe lesion, incompletely characterized on this noncontrast scan, for which no follow-up is required unless the patient has risk factors for liver malignancy. No additional liver lesions. Distended gallbladder (5.3 cm diameter). Layering sludge and tiny 2-3 mm gallstones in the  gallbladder. Borderline mild diffuse gallbladder wall thickening (gallbladder wall thickness 3 mm). No pericholecystic fluid. No biliary ductal dilatation. Common bile duct diameter 6 mm. Small amount of layering sludge in the common bile duct, with no filling defects in the common bile duct to suggest choledocholithiasis. Small periampullary duodenal diverticulum. Pancreas: There is a 0.8 cm cystic pancreatic body lesion (series 13001/image 52) without appreciable wall thickening or internal complexity. No additional pancreatic lesions. No pancreatic duct dilation. No convincing pancreas divisum (the main pancreatic duct is drained by an accessory duct of [place], however also appears to communicate with the common bile duct). Spleen: Normal size. No mass. Adrenals/Urinary Tract: No adrenal nodules. No hydronephrosis. Normal kidneys with no renal mass. Stomach/Bowel: Grossly normal stomach. Visualized small and large bowel is normal caliber, with no bowel wall thickening. Vascular/Lymphatic: Infrarenal 4.7 cm abdominal aortic aneurysm, stable. No pathologically enlarged lymph nodes in the abdomen. Other: Trace perihepatic ascites.  No focal fluid collection. Musculoskeletal: No aggressive appearing focal osseous lesions. Bilateral posterior spinal fusion hardware is visualized in the lower lumbar spine. IMPRESSION: 1. Layering sludge and tiny gallstones in the distended gallbladder with borderline mild diffuse gallbladder wall thickening. No pericholecystic  fluid. These findings are nonspecific and could be compatible with acute cholecystitis in the correct clinical setting. 2. No biliary ductal dilatation. CBD diameter 6 mm. Small amount of layering sludge in the CBD, with no evidence of choledocholithiasis. 3. 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 Follow-up MRI abdomen without and with IV contrast recommended in 2 years. This recommendation follows ACR consensus guidelines: Management of Incidental Pancreatic Cysts: A White Paper of the ACR Incidental Findings Committee. [place] 6226;33:354-562. 4. Infrarenal 4.7 cm abdominal aortic aneurysm. Recommend followup by abdomen and pelvis CTA in 6 months, and vascular surgery referral/consultation if not already obtained. This recommendation follows ACR consensus guidelines: White Paper of the ACR Incidental Findings Committee II on Vascular Findings. J Am Coll Radiol 2013; 10:789-794. 5. Trace perihepatic ascites. Electronically Signed   By: Ilona Sorrel M.D.   On: 09/24/2017 18:09   Mr 3d Recon At Scanner  Result Date: 09/24/2017 CLINICAL DATA:  Inpatient. Epigastric/right upper quadrant abdominal pain. Abnormal liver function tests. Cholelithiasis. EXAM: MRI ABDOMEN WITHOUT CONTRAST  (INCLUDING MRCP) TECHNIQUE: Multiplanar multisequence MR imaging of the abdomen was performed. Heavily T2-weighted images of the biliary and pancreatic ducts were obtained, and three-dimensional MRCP images were rendered by post processing. COMPARISON:  09/23/2017 unenhanced CT abdomen/pelvis and right upper quadrant abdominal sonogram. FINDINGS: Lower chest: Hypoventilatory changes in the dependent lung bases bilaterally. Hepatobiliary: Normal liver size and configuration. No hepatic steatosis. There is a sub 5 mm T2 hyperintense posterior right liver lobe lesion, incompletely characterized on this noncontrast scan, for which no follow-up is required unless the patient has risk  factors for liver malignancy. No additional liver lesions. Distended gallbladder (5.3 cm diameter). Layering sludge and tiny 2-3 mm gallstones in the gallbladder. Borderline mild diffuse gallbladder wall thickening (gallbladder wall thickness 3 mm). No pericholecystic fluid. No biliary ductal dilatation. Common bile duct diameter 6 mm. Small amount of layering sludge in the common bile duct, with no filling defects in the common bile duct to suggest choledocholithiasis. Small periampullary duodenal diverticulum. Pancreas: There is a 0.8 cm cystic pancreatic body lesion (series 13001/image 52) without appreciable wall thickening or internal complexity. No additional pancreatic lesions. No pancreatic duct dilation. No convincing pancreas divisum (the main pancreatic duct is drained by an  accessory duct of [place]ini, however also appears to communicate with the common bile duct). Spleen: Normal size. No mass. Adrenals/Urinary Tract: No adrenal nodules. No hydronephrosis. Normal kidneys with no renal mass. Stomach/Bowel: Grossly normal stomach. Visualized small and large bowel is normal caliber, with no bowel wall thickening. Vascular/Lymphatic: Infrarenal 4.7 cm abdominal aortic aneurysm, stable. No pathologically enlarged lymph nodes in the abdomen. Other: Trace perihepatic ascites.  No focal fluid collection. Musculoskeletal: No aggressive appearing focal osseous lesions. Bilateral posterior spinal fusion hardware is visualized in the lower lumbar spine. IMPRESSION: 1. Layering sludge and tiny gallstones in the distended gallbladder with borderline mild diffuse gallbladder wall thickening. No pericholecystic fluid. These findings are nonspecific and could be compatible with acute cholecystitis in the correct clinical setting. 2. No biliary ductal dilatation. CBD diameter 6 mm. Small amount of layering sludge in the CBD, with no evidence of choledocholithiasis. 3. 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 Follow-up MRI abdomen without and with IV contrast recommended in 2 years. This recommendation follows ACR consensus guidelines: Management of Incidental Pancreatic Cysts: A White Paper of the ACR Incidental Findings Committee. [place] 5462;70:350-093. 4. Infrarenal 4.7 cm abdominal aortic aneurysm. Recommend followup by abdomen and pelvis CTA in 6 months, and vascular surgery referral/consultation if not already obtained. This recommendation follows ACR consensus guidelines: White Paper of the ACR Incidental Findings Committee II on Vascular Findings. J Am Coll Radiol 2013; 10:789-794. 5. Trace perihepatic ascites. Electronically Signed   By: Ilona Sorrel M.D.   On: 09/24/2017 18:09   US Abdomen Limited  Result Date: 09/23/2017 CLINICAL DATA:  Right upper quadrant pain for several days EXAM: ULTRASOUND ABDOMEN LIMITED RIGHT UPPER QUADRANT COMPARISON:  CT from earlier in the same day. FINDINGS: Gallbladder: Well distended with gallstones and sludge. No pericholecystic fluid or gallbladder wall thickening is noted. Negative sonographic Percell Miller sign is noted. Common bile duct: Diameter: 5.2 mm. Liver: No focal lesion identified. Within normal limits in parenchymal echogenicity. Portal vein is patent on color Doppler imaging with normal direction of blood flow towards the liver. IMPRESSION: Cholelithiasis without definitive complicating factors. This may still be the etiology of the patient's discomfort. Electronically Signed   By: Inez Catalina M.D.   On: 09/23/2017 16:49   Dg Chest Portable 1 View  Result Date: 09/23/2017 CLINICAL DATA:  Shortness of breath and pain EXAM: PORTABLE CHEST 1 VIEW COMPARISON:  June 27, 2017 FINDINGS: There is no edema or consolidation. Heart size and pulmonary vascularity are normal. No adenopathy. No pneumothorax. There is aortic atherosclerosis. IMPRESSION: Aortic atherosclerosis.  No edema or consolidation. Aortic Atherosclerosis  (ICD10-I70.0). Electronically Signed   By: Lowella Grip III M.D.   On: 09/23/2017 11:23    Micro Results      Recent Results (from the past 240 hour(s))  Surgical pcr screen     Status: None   Collection Time: 09/25/17  8:45 PM  Result Value Ref Range Status   MRSA, PCR NEGATIVE NEGATIVE Final   Staphylococcus aureus NEGATIVE NEGATIVE Final    Comment: (NOTE) The Xpert SA Assay (FDA approved for NASAL specimens in patients 31 years of age and older), is one component of a comprehensive surveillance program. It is not intended to diagnose infection nor to guide or monitor treatment. Performed at [facility], [place] [address], [place], [place] [address]        Today   Subjective    Terry Garner today has been  afebrile.  Denies n/v, abd pain, brbpr.   JP draining well.    No headache,no chest pain, ,no new weakness tingling or numbness, feels much better wants to go home today.   Objective   Blood pressure (!) 149/65, pulse 64, temperature 99.6 F (37.6 C), temperature source Oral, resp. rate 17, height 6\' 3"  (1.905 m), weight 90 kg (198 lb 6.6 oz), SpO2 95 %.   Intake/Output Summary (Last 24 hours) at 10/01/2017 0732 Last data filed at 10/01/2017 0600 Gross per 24 hour  Intake 1410 ml  Output 405 ml  Net 1005 ml    Exam Awake Alert, Oriented x 3, No new F.N deficits, Normal affect [place].AT,PERRAL Supple Neck,No JVD, No cervical lymphadenopathy appriciated.  Symmetrical Chest wall movement, Good air movement bilaterally, CTAB RRR,No Gallops,Rubs or new Murmurs, No Parasternal Heave +ve B.Sounds, Abd Soft, Non tender, No organomegaly appriciated, No rebound -guarding or rigidity. No Cyanosis, Clubbing or edema, No new Rash or bruise   Data Review   CBC w Diff:  Lab Results  Component Value Date   WBC 10.7 (H) 10/01/2017   HGB 9.4 (L) 10/01/2017   HCT 28.9 (L) 10/01/2017   PLT 182 10/01/2017   LYMPHOPCT 17 12/20/2016   MONOPCT 7.3 07/15/2017    EOSPCT 2.6 07/15/2017   BASOPCT 1.0 07/15/2017    CMP:  Lab Results  Component Value Date   NA 139 10/01/2017   K 3.9 10/01/2017   CL 112 (H) 10/01/2017   CO2 19 (L) 10/01/2017   BUN 32 (H) 10/01/2017   CREATININE 2.21 (H) 10/01/2017   CREATININE 2.80 (H) 07/15/2017   PROT 4.1 (L) 10/01/2017   ALBUMIN 1.5 (L) 10/01/2017   BILITOT 2.3 (H) 10/01/2017   ALKPHOS 123 10/01/2017   AST 50 (H) 10/01/2017   ALT 31 10/01/2017  .   Total Time in preparing paper work, data evaluation and todays exam - 74 minutes  Jani Gravel M.D on 10/01/2017 at 7:32 AM  [facility]   Office  [phone]

## 2017-10-01 NOTE — Progress Notes (Signed)
De Hollingshead Glosser to be D/C'd  per MD order. Discussed with the patient and all questions fully answered.  VSS, Skin clean, dry and intact without evidence of skin break down, no evidence of skin tears noted.  IV catheter discontinued intact. Site without signs and symptoms of complications. Dressing and pressure applied.  An After Visit Summary was printed and given to the patient. Patient received prescription.  D/c education completed with patient/family including follow up instructions, medication list, d/c activities limitations if indicated, with other d/c instructions as indicated by MD - patient able to verbalize understanding, all questions fully answered.   Patient instructed to return to ED, call 911, or call MD for any changes in condition.   Patient to be escorted via [place], and D/C home via private auto.

## 2017-10-02 ENCOUNTER — Other Ambulatory Visit: Payer: Self-pay | Admitting: Internal Medicine

## 2017-10-02 DIAGNOSIS — K8042 Calculus of bile duct with acute cholecystitis without obstruction: Secondary | ICD-10-CM | POA: Diagnosis not present

## 2017-10-02 DIAGNOSIS — E43 Unspecified severe protein-calorie malnutrition: Secondary | ICD-10-CM | POA: Diagnosis not present

## 2017-10-02 DIAGNOSIS — I4510 Unspecified right bundle-branch block: Secondary | ICD-10-CM | POA: Diagnosis not present

## 2017-10-02 DIAGNOSIS — N184 Chronic kidney disease, stage 4 (severe): Secondary | ICD-10-CM | POA: Diagnosis not present

## 2017-10-02 DIAGNOSIS — E785 Hyperlipidemia, unspecified: Secondary | ICD-10-CM | POA: Diagnosis not present

## 2017-10-02 DIAGNOSIS — I129 Hypertensive chronic kidney disease with stage 1 through stage 4 chronic kidney disease, or unspecified chronic kidney disease: Secondary | ICD-10-CM | POA: Diagnosis not present

## 2017-10-02 DIAGNOSIS — I6529 Occlusion and stenosis of unspecified carotid artery: Secondary | ICD-10-CM | POA: Diagnosis not present

## 2017-10-02 DIAGNOSIS — I2510 Atherosclerotic heart disease of native coronary artery without angina pectoris: Secondary | ICD-10-CM | POA: Diagnosis not present

## 2017-10-02 DIAGNOSIS — M1990 Unspecified osteoarthritis, unspecified site: Secondary | ICD-10-CM | POA: Diagnosis not present

## 2017-10-02 DIAGNOSIS — F05 Delirium due to known physiological condition: Secondary | ICD-10-CM | POA: Diagnosis not present

## 2017-10-02 DIAGNOSIS — D62 Acute posthemorrhagic anemia: Secondary | ICD-10-CM | POA: Diagnosis not present

## 2017-10-02 DIAGNOSIS — I714 Abdominal aortic aneurysm, without rupture: Secondary | ICD-10-CM | POA: Diagnosis not present

## 2017-10-02 DIAGNOSIS — Z48815 Encounter for surgical aftercare following surgery on the digestive system: Secondary | ICD-10-CM | POA: Diagnosis not present

## 2017-10-04 ENCOUNTER — Telehealth: Payer: Self-pay | Admitting: *Deleted

## 2017-10-04 NOTE — Telephone Encounter (Signed)
ERROR

## 2017-10-04 NOTE — Telephone Encounter (Signed)
Called patient on 10/04/2017 , 9:24 AM in an attempt to reach the patient for a hospital follow up.   Admit date: 09/23/17 Discharge: 10/01/17  SCHEDULED 10/09/17 AT 11.15  He DOES NOT  have any questions or concerns about medications from the hospital admission. The patient's medications were reviewed over the phone, they were counseled to bring in all current medications to the hospital follow up visit.   I advised the patient to call if any questions or concerns arise about the hospital admission or medications    Home health WAS NOT started in the hospital.  All questions were answered and a follow up appointment was made.   Prior to Admission medications   Medication Sig Start Date End Date Taking? Authorizing Provider  allopurinol (ZYLOPRIM) 300 MG tablet Take 300 mg by mouth daily.    [provider]  amLODipine (NORVASC) 5 MG tablet Take 5 mg by mouth daily.    [provider]  aspirin EC 81 MG tablet Take 81 mg by mouth daily.    [provider]  brimonidine (ALPHAGAN) 0.15 % ophthalmic solution Place 1 drop into both eyes 2 (two) times daily.    [provider]  Cholecalciferol (VITAMIN D3) 5000 UNITS TABS Take 5,000 Units by mouth daily.    [provider]  clopidogrel (PLAVIX) 75 MG tablet TAKE 1 TABLET BY MOUTH ONCE DAILY FOR HEART STENTS 06/27/17   [name], MD  dorzolamide-timolol (COSOPT) 22.3-6.8 MG/ML ophthalmic solution Place 1 drop into both eyes 2 (two) times daily.    [provider]  fenofibrate micronized (LOFIBRA) 134 MG capsule TAKE ONE CAPSULE BY MOUTH ONCE DAILY 09/12/15   Forcucci, Loma Sousa, PA-C  fenofibrate micronized (LOFIBRA) 134 MG capsule TAKE 1 CAPSULE BY MOUTH ONCE DAILY 10/02/17   [name], MD  furosemide (LASIX) 20 MG tablet Take 40 mg by mouth daily.     [provider]  furosemide (LASIX) 40 MG tablet Take 40 mg by mouth daily. 08/29/17   [provider]  loteprednol  (LOTEMAX) 0.5 % ophthalmic suspension Place 1 drop into the right eye 2 (two) times daily.     [provider]  nepafenac (ILEVRO) 0.3 % ophthalmic suspension Place 1 drop into the right eye daily.    [provider]  pravastatin (PRAVACHOL) 40 MG tablet TAKE ONE TABLET BY MOUTH AT BEDTIME FOR  CHOLESTEROL 03/25/17   [name], MD  traMADol (ULTRAM) 50 MG tablet Take 1 tablet (50 mg total) by mouth every 12 (twelve) hours as needed for severe pain. 10/01/17   Jani Gravel, MD

## 2017-10-07 ENCOUNTER — Observation Stay (HOSPITAL_COMMUNITY): Payer: Medicare Other

## 2017-10-07 ENCOUNTER — Other Ambulatory Visit: Payer: Self-pay | Admitting: General Surgery

## 2017-10-07 ENCOUNTER — Ambulatory Visit: Payer: Self-pay | Admitting: General Surgery

## 2017-10-07 ENCOUNTER — Inpatient Hospital Stay (HOSPITAL_COMMUNITY)
Admission: AD | Admit: 2017-10-07 | Discharge: 2017-10-18 | DRG: 394 | Disposition: A | Discharge status: Home Health | Payer: Medicare Other | Source: Ambulatory Visit | Attending: Gastroenterology | Admitting: Gastroenterology

## 2017-10-07 DIAGNOSIS — I739 Peripheral vascular disease, unspecified: Secondary | ICD-10-CM | POA: Diagnosis present

## 2017-10-07 DIAGNOSIS — Z7982 Long term (current) use of aspirin: Secondary | ICD-10-CM

## 2017-10-07 DIAGNOSIS — K839 Disease of biliary tract, unspecified: Secondary | ICD-10-CM | POA: Diagnosis not present

## 2017-10-07 DIAGNOSIS — Z955 Presence of coronary angioplasty implant and graft: Secondary | ICD-10-CM

## 2017-10-07 DIAGNOSIS — H409 Unspecified glaucoma: Secondary | ICD-10-CM | POA: Diagnosis present

## 2017-10-07 DIAGNOSIS — K9189 Other postprocedural complications and disorders of digestive system: Principal | ICD-10-CM | POA: Diagnosis present

## 2017-10-07 DIAGNOSIS — E785 Hyperlipidemia, unspecified: Secondary | ICD-10-CM | POA: Diagnosis present

## 2017-10-07 DIAGNOSIS — Z888 Allergy status to other drugs, medicaments and biological substances status: Secondary | ICD-10-CM | POA: Diagnosis not present

## 2017-10-07 DIAGNOSIS — Y838 Other surgical procedures as the cause of abnormal reaction of the patient, or of later complication, without mention of misadventure at the time of the procedure: Secondary | ICD-10-CM | POA: Diagnosis present

## 2017-10-07 DIAGNOSIS — M109 Gout, unspecified: Secondary | ICD-10-CM | POA: Diagnosis present

## 2017-10-07 DIAGNOSIS — Z7902 Long term (current) use of antithrombotics/antiplatelets: Secondary | ICD-10-CM

## 2017-10-07 DIAGNOSIS — I129 Hypertensive chronic kidney disease with stage 1 through stage 4 chronic kidney disease, or unspecified chronic kidney disease: Secondary | ICD-10-CM | POA: Diagnosis not present

## 2017-10-07 DIAGNOSIS — K838 Other specified diseases of biliary tract: Secondary | ICD-10-CM | POA: Diagnosis not present

## 2017-10-07 DIAGNOSIS — R001 Bradycardia, unspecified: Secondary | ICD-10-CM | POA: Diagnosis not present

## 2017-10-07 DIAGNOSIS — K5710 Diverticulosis of small intestine without perforation or abscess without bleeding: Secondary | ICD-10-CM | POA: Diagnosis not present

## 2017-10-07 DIAGNOSIS — K8050 Calculus of bile duct without cholangitis or cholecystitis without obstruction: Secondary | ICD-10-CM | POA: Diagnosis not present

## 2017-10-07 DIAGNOSIS — I2510 Atherosclerotic heart disease of native coronary artery without angina pectoris: Secondary | ICD-10-CM | POA: Diagnosis not present

## 2017-10-07 DIAGNOSIS — D649 Anemia, unspecified: Secondary | ICD-10-CM | POA: Diagnosis not present

## 2017-10-07 DIAGNOSIS — T8189XA Other complications of procedures, not elsewhere classified, initial encounter: Secondary | ICD-10-CM

## 2017-10-07 DIAGNOSIS — Z96642 Presence of left artificial hip joint: Secondary | ICD-10-CM | POA: Diagnosis not present

## 2017-10-07 DIAGNOSIS — R109 Unspecified abdominal pain: Secondary | ICD-10-CM | POA: Diagnosis present

## 2017-10-07 DIAGNOSIS — I714 Abdominal aortic aneurysm, without rupture: Secondary | ICD-10-CM | POA: Diagnosis present

## 2017-10-07 DIAGNOSIS — Z4659 Encounter for fitting and adjustment of other gastrointestinal appliance and device: Secondary | ICD-10-CM | POA: Diagnosis not present

## 2017-10-07 DIAGNOSIS — Z9889 Other specified postprocedural states: Secondary | ICD-10-CM | POA: Diagnosis not present

## 2017-10-07 DIAGNOSIS — Z9049 Acquired absence of other specified parts of digestive tract: Secondary | ICD-10-CM | POA: Diagnosis not present

## 2017-10-07 DIAGNOSIS — J90 Pleural effusion, not elsewhere classified: Secondary | ICD-10-CM | POA: Diagnosis present

## 2017-10-07 DIAGNOSIS — Z91048 Other nonmedicinal substance allergy status: Secondary | ICD-10-CM

## 2017-10-07 DIAGNOSIS — K668 Other specified disorders of peritoneum: Secondary | ICD-10-CM

## 2017-10-07 DIAGNOSIS — N189 Chronic kidney disease, unspecified: Secondary | ICD-10-CM | POA: Diagnosis not present

## 2017-10-07 DIAGNOSIS — N184 Chronic kidney disease, stage 4 (severe): Secondary | ICD-10-CM | POA: Diagnosis not present

## 2017-10-07 DIAGNOSIS — T8143XA Infection following a procedure, organ and space surgical site, initial encounter: Secondary | ICD-10-CM | POA: Diagnosis not present

## 2017-10-07 DIAGNOSIS — T8189XS Other complications of procedures, not elsewhere classified, sequela: Secondary | ICD-10-CM

## 2017-10-07 HISTORY — DX: Other specified diseases of biliary tract: K83.8

## 2017-10-07 LAB — COMPREHENSIVE METABOLIC PANEL
ALT: 22 U/L (ref 17–63)
ANION GAP: 9 (ref 5–15)
AST: 29 U/L (ref 15–41)
Albumin: 1.6 g/dL — ABNORMAL LOW (ref 3.5–5.0)
Alkaline Phosphatase: 151 U/L — ABNORMAL HIGH (ref 38–126)
BUN: 38 mg/dL — ABNORMAL HIGH (ref 6–20)
CALCIUM: 8.1 mg/dL — AB (ref 8.9–10.3)
CHLORIDE: 109 mmol/L (ref 101–111)
CO2: 21 mmol/L — ABNORMAL LOW (ref 22–32)
Creatinine, Ser: 2.28 mg/dL — ABNORMAL HIGH (ref 0.61–1.24)
GFR, EST AFRICAN AMERICAN: 30 mL/min — AB (ref >60)
GFR, EST NON AFRICAN AMERICAN: 26 mL/min — AB (ref >60)
Glucose, Bld: 103 mg/dL — ABNORMAL HIGH (ref 65–99)
Potassium: 3.7 mmol/L (ref 3.5–5.1)
SODIUM: 139 mmol/L (ref 135–145)
Total Bilirubin: 1.9 mg/dL — ABNORMAL HIGH (ref 0.3–1.2)
Total Protein: 4.7 g/dL — ABNORMAL LOW (ref 6.5–8.1)

## 2017-10-07 LAB — CBC
HCT: 25.4 % — ABNORMAL LOW (ref 39.0–52.0)
HEMOGLOBIN: 8.4 g/dL — AB (ref 13.0–17.0)
MCH: 30 pg (ref 26.0–34.0)
MCHC: 33.1 g/dL (ref 30.0–36.0)
MCV: 90.7 fL (ref 78.0–100.0)
PLATELETS: 259 10*3/uL (ref 150–400)
RBC: 2.8 MIL/uL — AB (ref 4.22–5.81)
RDW: 16.6 % — ABNORMAL HIGH (ref 11.5–15.5)
WBC: 18.9 10*3/uL — AB (ref 4.0–10.5)

## 2017-10-07 MED ORDER — ACETAMINOPHEN 325 MG PO TABS: 650.0000 mg | ORAL_TABLET | Freq: Four times a day (QID) | ORAL | Status: DC | PRN

## 2017-10-07 MED ORDER — KCL IN DEXTROSE-NACL 20-5-0.45 MEQ/L-%-% IV SOLN
INTRAVENOUS | Status: DC
  Administered 2017-10-07 – 2017-10-11 (×4): via INTRAVENOUS
  Filled 2017-10-07 (×6): qty 1000

## 2017-10-07 MED ORDER — LOTEPREDNOL ETABONATE 0.5 % OP SUSP
1.0000 [drp] | Freq: Two times a day (BID) | OPHTHALMIC | Status: DC
  Administered 2017-10-07 – 2017-10-18 (×22): 1 [drp] via OPHTHALMIC
  Filled 2017-10-07: qty 5

## 2017-10-07 MED ORDER — TECHNETIUM TC 99M MEBROFENIN IV KIT
5.0000 | PACK | Freq: Once | INTRAVENOUS | Status: AC | PRN
  Administered 2017-10-07: 5 via INTRAVENOUS

## 2017-10-07 MED ORDER — FAMOTIDINE IN NACL 20-0.9 MG/50ML-% IV SOLN
20.0000 mg | INTRAVENOUS | Status: DC
  Administered 2017-10-07 – 2017-10-11 (×5): 20 mg via INTRAVENOUS
  Filled 2017-10-07 (×5): qty 50

## 2017-10-07 MED ORDER — POLYETHYLENE GLYCOL 3350 17 G PO PACK: 17.0000 g | PACK | Freq: Every day | ORAL | Status: DC | PRN

## 2017-10-07 MED ORDER — AMLODIPINE BESYLATE 5 MG PO TABS
5.0000 mg | ORAL_TABLET | Freq: Every day | ORAL | Status: DC
  Administered 2017-10-07 – 2017-10-18 (×11): 5 mg via ORAL
  Filled 2017-10-07 (×11): qty 1

## 2017-10-07 MED ORDER — ACETAMINOPHEN 650 MG RE SUPP: 650.0000 mg | Freq: Four times a day (QID) | RECTAL | Status: DC | PRN

## 2017-10-07 MED ORDER — DIPHENHYDRAMINE HCL 50 MG/ML IJ SOLN: 12.5000 mg | Freq: Four times a day (QID) | INTRAMUSCULAR | Status: DC | PRN

## 2017-10-07 MED ORDER — ONDANSETRON HCL 4 MG/2ML IJ SOLN: 4.0000 mg | Freq: Four times a day (QID) | INTRAMUSCULAR | Status: DC | PRN

## 2017-10-07 MED ORDER — SODIUM CHLORIDE 0.9 % IV SOLN: 4.0000 mg | Freq: Four times a day (QID) | INTRAVENOUS | Status: DC | PRN

## 2017-10-07 MED ORDER — MORPHINE SULFATE (PF) 2 MG/ML IV SOLN
1.0000 mg | INTRAVENOUS | Status: DC | PRN
Start: 2017-10-07 — End: 2017-10-18
  Administered 2017-10-10 – 2017-10-17 (×2): 2 mg via INTRAVENOUS
  Filled 2017-10-07 (×2): qty 1

## 2017-10-07 MED ORDER — BRIMONIDINE TARTRATE 0.15 % OP SOLN
1.0000 [drp] | Freq: Two times a day (BID) | OPHTHALMIC | Status: DC
  Administered 2017-10-07 – 2017-10-18 (×22): 1 [drp] via OPHTHALMIC
  Filled 2017-10-07: qty 5

## 2017-10-07 MED ORDER — SIMETHICONE 80 MG PO CHEW: 40.0000 mg | CHEWABLE_TABLET | Freq: Four times a day (QID) | ORAL | Status: DC | PRN

## 2017-10-07 MED ORDER — FUROSEMIDE 40 MG PO TABS: 40.0000 mg | ORAL_TABLET | Freq: Every day | ORAL | Status: DC

## 2017-10-07 MED ORDER — ONDANSETRON 4 MG PO TBDP: 4.0000 mg | ORAL_TABLET | Freq: Four times a day (QID) | ORAL | Status: DC | PRN

## 2017-10-07 MED ORDER — ENOXAPARIN SODIUM 150 MG/ML ~~LOC~~ SOLN: 40.0000 mg | SUBCUTANEOUS | Status: DC

## 2017-10-07 MED ORDER — TRAMADOL HCL 50 MG PO TABS: 50.0000 mg | ORAL_TABLET | Freq: Four times a day (QID) | ORAL | Status: DC | PRN

## 2017-10-07 MED ORDER — NEPAFENAC 0.1 % OP SUSP
1.0000 [drp] | Freq: Three times a day (TID) | OPHTHALMIC | Status: DC
  Administered 2017-10-07 – 2017-10-18 (×30): 1 [drp] via OPHTHALMIC
  Filled 2017-10-07: qty 3

## 2017-10-07 MED ORDER — DIPHENHYDRAMINE HCL 12.5 MG/5ML PO ELIX: 12.5000 mg | ORAL_SOLUTION | Freq: Four times a day (QID) | ORAL | Status: DC | PRN

## 2017-10-07 MED ORDER — MORPHINE SULFATE (PF) 4 MG/ML IV SOLN: 2.0000 mg | INTRAVENOUS | Status: DC | PRN

## 2017-10-07 MED ORDER — OXYCODONE HCL 5 MG PO TABS: 5.0000 mg | ORAL_TABLET | ORAL | Status: DC | PRN

## 2017-10-07 MED ORDER — DORZOLAMIDE HCL-TIMOLOL MAL 2-0.5 % OP SOLN
1.0000 [drp] | Freq: Two times a day (BID) | OPHTHALMIC | Status: DC
  Administered 2017-10-07 – 2017-10-18 (×22): 1 [drp] via OPHTHALMIC
  Filled 2017-10-07: qty 10

## 2017-10-07 MED ORDER — NEPAFENAC 0.3 % OP SUSP: 1.0000 [drp] | Freq: Every day | OPHTHALMIC | Status: DC

## 2017-10-07 MED ORDER — ZOLPIDEM TARTRATE 5 MG PO TABS: 5.0000 mg | ORAL_TABLET | Freq: Every evening | ORAL | Status: DC | PRN

## 2017-10-07 MED ORDER — SODIUM CHLORIDE 0.9 % IV SOLN: INTRAVENOUS | Status: DC

## 2017-10-07 MED ORDER — NEPAFENAC 0.3 % OP SUSP
1.0000 [drp] | Freq: Every day | OPHTHALMIC | Status: DC
  Filled 2017-10-07: qty 3

## 2017-10-07 NOTE — Progress Notes (Deleted)
Hospital follow up  Assessment and Plan: Hospital visit follow up for ***:  Hospital discharge meds were reviewed, and reconciled with the patient.    There are no discontinued medications.  Over 40 minutes of exam, counseling, chart review, and complex, high/moderate level critical decision making was performed this visit.     HPI 77 y.o.male presents for follow up for transition from recent hospitalization or SNIF stay. Admit date to the hospital was 09/23/17, patient was discharged from the hospital on 10/01/17 and our clinical staff contacted the office the day after discharge to set up a follow up appointment. The discharge summary, medications, and diagnostic test results were reviewed before meeting with the patient. The patient was admitted for:  Choledocholithiasis with acute cholecystitis. He had hypomagnesia and hypokalemia in the hospital that needs to be rechecked. He also had post op anemia that needs to be monitored, Hb dropped from 9.5 to 7.9 Postop, transfused with 2 units of PRBC4/5. He did have some hallucinations and sundowning thought to be due to the pain medication that resolved.    Home health is involved. [facility] Follow up.   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 [place] [address] [phone]    Images while in the hospital: Ct Abdomen Pelvis Wo Contrast  Result Date: 09/23/2017 CLINICAL DATA:  Abdominal pain.  Increasing abdominal pain EXAM: CT ABDOMEN AND PELVIS WITHOUT CONTRAST TECHNIQUE: Multidetector CT imaging of the abdomen and pelvis was performed following the standard protocol without IV contrast. COMPARISON:  None. FINDINGS: Lower chest: Mild bibasilar atelectasis. Hepatobiliary: No focal hepatic lesion on noncontrast exam. Gallbladder distended to 5.5 cm. No radiodense gallstones are present. Common bile duct appears normal caliber. Small amount of free fluid along the RIGHT hepatic margin  Pancreas: Pancreas is normal. No ductal dilatation. No pancreatic inflammation. Spleen: Normal spleen Adrenals/urinary tract: Adrenal glands normal. No nephrolithiasis or ureterolithiasis. Vascular calcification in the RIGHT renal hilum. No bladder calculi Stomach/Bowel: Stomach, small bowel, appendix, and cecum are normal. Multiple sigmoid diverticula. No acute diverticulitis. Vascular/Lymphatic: Abdominal aorta is normal caliber with atherosclerotic calcification. There is no retroperitoneal or periportal lymphadenopathy. No pelvic lymphadenopathy. Reproductive: Prostate normal Other: No free fluid. Musculoskeletal: No aggressive osseous lesion. IMPRESSION: 1. Gallbladder distension without of evidence gallbladder inflammation. No radiodense gallstones. Consider ultrasound if concern for cholecystitis. 2. Small free fluid along the RIGHT hepatic lobe. 3. Extensive sigmoid diverticulosis without evidence diverticulitis. 4.  Aortic Atherosclerosis (ICD10-I70.0). Electronically Signed   By: Suzy Bouchard M.D.   On: 09/23/2017 15:33   Mr Abdomen Mrcp Wo Contrast  Result Date: 09/24/2017 CLINICAL DATA:  Inpatient. Epigastric/right upper quadrant abdominal pain. Abnormal liver function tests. Cholelithiasis. EXAM: MRI ABDOMEN WITHOUT CONTRAST  (INCLUDING MRCP) TECHNIQUE: Multiplanar multisequence MR imaging of the abdomen was performed. Heavily T2-weighted images of the biliary and pancreatic ducts were obtained, and three-dimensional MRCP images were rendered by post processing. COMPARISON:  09/23/2017 unenhanced CT abdomen/pelvis and right upper quadrant abdominal sonogram. FINDINGS: Lower chest: Hypoventilatory changes in the dependent lung bases bilaterally. Hepatobiliary: Normal liver size and configuration. No hepatic steatosis. There is a sub 5 mm T2 hyperintense posterior right liver lobe lesion, incompletely characterized on this noncontrast scan, for which no follow-up is required unless the patient has  risk factors for liver malignancy. No additional liver lesions. Distended gallbladder (5.3 cm diameter). Layering sludge and tiny 2-3 mm gallstones in the gallbladder. Borderline mild diffuse gallbladder wall thickening (gallbladder wall  thickness 3 mm). No pericholecystic fluid. No biliary ductal dilatation. Common bile duct diameter 6 mm. Small amount of layering sludge in the common bile duct, with no filling defects in the common bile duct to suggest choledocholithiasis. Small periampullary duodenal diverticulum. Pancreas: There is a 0.8 cm cystic pancreatic body lesion (series 13001/image 52) without appreciable wall thickening or internal complexity. No additional pancreatic lesions. No pancreatic duct dilation. No convincing pancreas divisum (the main pancreatic duct is drained by an accessory duct of [place], however also appears to communicate with the common bile duct). Spleen: Normal size. No mass. Adrenals/Urinary Tract: No adrenal nodules. No hydronephrosis. Normal kidneys with no renal mass. Stomach/Bowel: Grossly normal stomach. Visualized small and large bowel is normal caliber, with no bowel wall thickening. Vascular/Lymphatic: Infrarenal 4.7 cm abdominal aortic aneurysm, stable. No pathologically enlarged lymph nodes in the abdomen. Other: Trace perihepatic ascites.  No focal fluid collection. Musculoskeletal: No aggressive appearing focal osseous lesions. Bilateral posterior spinal fusion hardware is visualized in the lower lumbar spine. IMPRESSION: 1. Layering sludge and tiny gallstones in the distended gallbladder with borderline mild diffuse gallbladder wall thickening. No pericholecystic fluid. These findings are nonspecific and could be compatible with acute cholecystitis in the correct clinical setting. 2. No biliary ductal dilatation. CBD diameter 6 mm. Small amount of layering sludge in the CBD, with no evidence of choledocholithiasis. 3. 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 Follow-up MRI abdomen without and with IV contrast recommended in 2 years. This recommendation follows ACR consensus guidelines: Management of Incidental Pancreatic Cysts: A White Paper of the ACR Incidental Findings Committee. [place] 7846;96:295-284. 4. Infrarenal 4.7 cm abdominal aortic aneurysm. Recommend followup by abdomen and pelvis CTA in 6 months, and vascular surgery referral/consultation if not already obtained. This recommendation follows ACR consensus guidelines: White Paper of the ACR Incidental Findings Committee II on Vascular Findings. J Am Coll Radiol 2013; 10:789-794. 5. Trace perihepatic ascites. Electronically Signed   By: Ilona Sorrel M.D.   On: 09/24/2017 18:09   Mr 3d Recon At Scanner  Result Date: 09/24/2017 CLINICAL DATA:  Inpatient. Epigastric/right upper quadrant abdominal pain. Abnormal liver function tests. Cholelithiasis. EXAM: MRI ABDOMEN WITHOUT CONTRAST  (INCLUDING MRCP) TECHNIQUE: Multiplanar multisequence MR imaging of the abdomen was performed. Heavily T2-weighted images of the biliary and pancreatic ducts were obtained, and three-dimensional MRCP images were rendered by post processing. COMPARISON:  09/23/2017 unenhanced CT abdomen/pelvis and right upper quadrant abdominal sonogram. FINDINGS: Lower chest: Hypoventilatory changes in the dependent lung bases bilaterally. Hepatobiliary: Normal liver size and configuration. No hepatic steatosis. There is a sub 5 mm T2 hyperintense posterior right liver lobe lesion, incompletely characterized on this noncontrast scan, for which no follow-up is required unless the patient has risk factors for liver malignancy. No additional liver lesions. Distended gallbladder (5.3 cm diameter). Layering sludge and tiny 2-3 mm gallstones in the gallbladder. Borderline mild diffuse gallbladder wall thickening (gallbladder wall thickness 3 mm). No pericholecystic fluid. No biliary ductal  dilatation. Common bile duct diameter 6 mm. Small amount of layering sludge in the common bile duct, with no filling defects in the common bile duct to suggest choledocholithiasis. Small periampullary duodenal diverticulum. Pancreas: There is a 0.8 cm cystic pancreatic body lesion (series 13001/image 52) without appreciable wall thickening or internal complexity. No additional pancreatic lesions. No pancreatic duct dilation. No convincing pancreas divisum (the main pancreatic duct is drained by an accessory duct of [place], however also appears to communicate  with the common bile duct). Spleen: Normal size. No mass. Adrenals/Urinary Tract: No adrenal nodules. No hydronephrosis. Normal kidneys with no renal mass. Stomach/Bowel: Grossly normal stomach. Visualized small and large bowel is normal caliber, with no bowel wall thickening. Vascular/Lymphatic: Infrarenal 4.7 cm abdominal aortic aneurysm, stable. No pathologically enlarged lymph nodes in the abdomen. Other: Trace perihepatic ascites.  No focal fluid collection. Musculoskeletal: No aggressive appearing focal osseous lesions. Bilateral posterior spinal fusion hardware is visualized in the lower lumbar spine. IMPRESSION: 1. Layering sludge and tiny gallstones in the distended gallbladder with borderline mild diffuse gallbladder wall thickening. No pericholecystic fluid. These findings are nonspecific and could be compatible with acute cholecystitis in the correct clinical setting. 2. No biliary ductal dilatation. CBD diameter 6 mm. Small amount of layering sludge in the CBD, with no evidence of choledocholithiasis. 3. 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 Follow-up MRI abdomen without and with IV contrast recommended in 2 years. This recommendation follows ACR consensus guidelines: Management of Incidental Pancreatic Cysts: A White Paper of the ACR Incidental Findings Committee. [place]  4196;22:297-989. 4. Infrarenal 4.7 cm abdominal aortic aneurysm. Recommend followup by abdomen and pelvis CTA in 6 months, and vascular surgery referral/consultation if not already obtained. This recommendation follows ACR consensus guidelines: White Paper of the ACR Incidental Findings Committee II on Vascular Findings. J Am Coll Radiol 2013; 10:789-794. 5. Trace perihepatic ascites. Electronically Signed   By: Ilona Sorrel M.D.   On: 09/24/2017 18:09   US Abdomen Limited  Result Date: 09/23/2017 CLINICAL DATA:  Right upper quadrant pain for several days EXAM: ULTRASOUND ABDOMEN LIMITED RIGHT UPPER QUADRANT COMPARISON:  CT from earlier in the same day. FINDINGS: Gallbladder: Well distended with gallstones and sludge. No pericholecystic fluid or gallbladder wall thickening is noted. Negative sonographic Percell Miller sign is noted. Common bile duct: Diameter: 5.2 mm. Liver: No focal lesion identified. Within normal limits in parenchymal echogenicity. Portal vein is patent on color Doppler imaging with normal direction of blood flow towards the liver. IMPRESSION: Cholelithiasis without definitive complicating factors. This may still be the etiology of the patient's discomfort. Electronically Signed   By: Inez Catalina M.D.   On: 09/23/2017 16:49   Dg Chest Portable 1 View  Result Date: 09/23/2017 CLINICAL DATA:  Shortness of breath and pain EXAM: PORTABLE CHEST 1 VIEW COMPARISON:  June 27, 2017 FINDINGS: There is no edema or consolidation. Heart size and pulmonary vascularity are normal. No adenopathy. No pneumothorax. There is aortic atherosclerosis. IMPRESSION: Aortic atherosclerosis.  No edema or consolidation. Aortic Atherosclerosis (ICD10-I70.0). Electronically Signed   By: Lowella Grip III M.D.   On: 09/23/2017 11:23    Past Medical History:  Diagnosis Date  . AAA (abdominal aortic aneurysm) (HCC)    3.6 cm (01/09/12 ultrasound)  . Allergy   . Arthritis   . Carotid artery disease ([place])   .  Cataract   . Chronic kidney disease    CKD, right renal artery stenosis  . Coronary artery disease    mild left internal carotid artery stenosis  . Glaucoma   . Gout   . Heart murmur   . Hyperlipidemia   . Hypertension   . Tubular adenoma of colon 2003   Dr. Watt Climes  . Vitamin D deficiency      Allergies  Allergen Reactions  . Toprol Xl [Metoprolol Tartrate] Other (See Comments)    Bradycardia with beta blockers  . Lipitor [Atorvastatin]  Myalgias   . Coumadin [Warfarin Sodium] Rash  . Tape Rash    Clear tape  . Warfarin Rash      Current Outpatient Medications on File Prior to Visit  Medication Sig Dispense Refill  . allopurinol (ZYLOPRIM) 300 MG tablet Take 300 mg by mouth daily.    [name] amLODipine (NORVASC) 5 MG tablet Take 5 mg by mouth daily.    [name] aspirin EC 81 MG tablet Take 81 mg by mouth daily.    . brimonidine (ALPHAGAN) 0.15 % ophthalmic solution Place 1 drop into both eyes 2 (two) times daily.    . Cholecalciferol (VITAMIN D3) 5000 UNITS TABS Take 5,000 Units by mouth daily.    . clopidogrel (PLAVIX) 75 MG tablet TAKE 1 TABLET BY MOUTH ONCE DAILY FOR HEART STENTS 90 tablet 1  . dorzolamide-timolol (COSOPT) 22.3-6.8 MG/ML ophthalmic solution Place 1 drop into both eyes 2 (two) times daily.    . fenofibrate micronized (LOFIBRA) 134 MG capsule TAKE ONE CAPSULE BY MOUTH ONCE DAILY 30 capsule 0  . fenofibrate micronized (LOFIBRA) 134 MG capsule TAKE 1 CAPSULE BY MOUTH ONCE DAILY 90 capsule 1  . furosemide (LASIX) 20 MG tablet Take 40 mg by mouth daily.     . furosemide (LASIX) 40 MG tablet Take 40 mg by mouth daily.  4  . loteprednol (LOTEMAX) 0.5 % ophthalmic suspension Place 1 drop into the right eye 2 (two) times daily.     . nepafenac (ILEVRO) 0.3 % ophthalmic suspension Place 1 drop into the right eye daily.    . pravastatin (PRAVACHOL) 40 MG tablet TAKE ONE TABLET BY MOUTH AT BEDTIME FOR  CHOLESTEROL 90 tablet 1  . traMADol (ULTRAM) 50 MG tablet Take 1 tablet  (50 mg total) by mouth every 12 (twelve) hours as needed for severe pain. 10 tablet 0   No current facility-administered medications on file prior to visit.     ROS: all negative except above.   Physical Exam: There were no vitals filed for this visit. There were no vitals taken for this visit. General Appearance: Well nourished, in no apparent distress. Eyes: PERRLA, EOMs, conjunctiva no swelling or erythema Sinuses: No Frontal/maxillary tenderness ENT/Mouth: Ext aud canals clear, TMs without erythema, bulging. No erythema, swelling, or exudate on post pharynx.  Tonsils not swollen or erythematous. Hearing normal.  Neck: Supple, thyroid normal.  Respiratory: Respiratory effort normal, BS equal bilaterally without rales, rhonchi, wheezing or stridor.  Cardio: RRR with no MRGs. Brisk peripheral pulses without edema.  Abdomen: Soft, + BS.  Non tender, no guarding, rebound, hernias, masses. Lymphatics: Non tender without lymphadenopathy.  Musculoskeletal: Full ROM, 5/5 strength, normal gait.  Skin: Warm, dry without rashes, lesions, ecchymosis.  Neuro: Cranial nerves intact. Normal muscle tone, no cerebellar symptoms. Sensation intact.  Psych: Awake and oriented X 3, normal affect, Insight and Judgment appropriate.     Vicie Mutters, PA-C 7:31 AM [facility]

## 2017-10-07 NOTE — Progress Notes (Signed)
HIDA c/w Bile leak.  Contacting GI for ERCP and stent placement.  Will also get a RUQ [place] to evaluate for biloma that may be present and not controlled already by existing surgical JP drain.  Will keep patient NPO for hopefully ERCP tomorrow if able.  Henreitta Cea 4:22 PM 10/07/2017

## 2017-10-07 NOTE — Progress Notes (Signed)
77 yo male with 1 month of abdominal pain. The pain has been intermittent and sometimes related to food. Yesterday the pain became worse than before after eating a cabbage/hamburger meal. He notes nausea and vomiting. He denies diarrhea. Initially, he thought he was having a heart attack because it was the worst pain he ever felt. He was admitted and then underwent evaluation with eventual lap subtotal cholecystectomy with drain placement.  His gb was dead and full of pus.  He did well and was discharged home. He returns today feeling fatigued. Has drainage around jp. It over last day has turned more green and was yellow. Appetite not normal.  No fevers, hr normal today.  No real abd pain except postsurgical. No n/v.        Past Medical History:  Diagnosis Date  . AAA (abdominal aortic aneurysm) (HCC)    3.6 cm (01/09/12 ultrasound)  . Allergy   . Arthritis   . Carotid artery disease ([place])   . Cataract   . Chronic kidney disease    CKD, right renal artery stenosis  . Coronary artery disease    mild left internal carotid artery stenosis  . Glaucoma   . Gout   . Heart murmur   . Hyperlipidemia   . Hypertension   . Tubular adenoma of colon 2003   Dr. Watt Climes  . Vitamin D deficiency          Past Surgical History:  Procedure Laterality Date  . BACK SURGERY    . CARDIAC CATHETERIZATION     stents  last -07  . CARDIAC STENTS    . CERVICAL DISC SURGERY    . EYE SURGERY Left 06   retinal detachment  . HERNIA REPAIR Right 93  . LUMBAR LAMINECTOMY  06/06/2012   Procedure: MICRODISCECTOMY LUMBAR LAMINECTOMY;  Surgeon: Marybelle Killings, MD;  Location: [facility];  Service: Orthopedics;  Laterality: Left;  Left L4-5 Microdiscectomy  . SHOULDER ARTHROSCOPY Right   . TOTAL HIP ARTHROPLASTY Left   lap subtotal chole with drain       Family History  Problem Relation Age of Onset  . Other Brother        laryngeal cancer  . Heart disease Brother   . Heart  disease Mother   . Heart disease Father   . Stroke Father   . Heart disease Sister   . Diabetes Brother   . Colon cancer Neg Hx   . Esophageal cancer Neg Hx   . Stomach cancer Neg Hx   . Rectal cancer Neg Hx     Social History:  reports that he has never smoked. He has never used smokeless tobacco. He reports that he does not drink alcohol or use drugs.  Allergies:       Allergies  Allergen Reactions  . Toprol Xl [Metoprolol Tartrate] Other (See Comments)    Bradycardia with beta blockers  . Lipitor [Atorvastatin]     Myalgias   . Coumadin [Warfarin Sodium] Rash  . Tape Rash    Clear tape  . Warfarin Rash    Medications: I have reviewed the patient's current medications.  Review of Systems  Fatigue and otherwise negative.   Temp 98.4 wt 180 pulse 79 General fatigued appearing male in nad cv rrr Lungs clea abd greenish drainage in jp, soft nontender incisions clean  Assessment/Plan: Likely bile leak -admission, hida scan, ruq [place] to confirm leak and tell if any fluid collection that needs drainage -check labs -npo -  I have discussed with inpatient service and they will follow up

## 2017-10-07 NOTE — Progress Notes (Signed)
[facility]  Patient Status: Active (receiving services up to time of hospitalization)  [facility] is providing the following services: RN and PT  If patient discharges after hours, please call [phone].   Terry Garner 10/07/2017, 3:04 PM

## 2017-10-08 ENCOUNTER — Other Ambulatory Visit: Payer: Self-pay

## 2017-10-08 ENCOUNTER — Observation Stay (HOSPITAL_COMMUNITY): Payer: Medicare Other

## 2017-10-08 ENCOUNTER — Encounter (HOSPITAL_COMMUNITY): Payer: Self-pay | Admitting: General Practice

## 2017-10-08 ENCOUNTER — Ambulatory Visit (HOSPITAL_COMMUNITY)
Admission: RE | Admit: 2017-10-08 | Payer: Medicare Other | Source: Ambulatory Visit | Attending: Cardiovascular Disease | Admitting: Cardiovascular Disease

## 2017-10-08 DIAGNOSIS — K9189 Other postprocedural complications and disorders of digestive system: Secondary | ICD-10-CM | POA: Diagnosis not present

## 2017-10-08 DIAGNOSIS — Z7902 Long term (current) use of antithrombotics/antiplatelets: Secondary | ICD-10-CM | POA: Diagnosis not present

## 2017-10-08 DIAGNOSIS — I2510 Atherosclerotic heart disease of native coronary artery without angina pectoris: Secondary | ICD-10-CM | POA: Diagnosis not present

## 2017-10-08 DIAGNOSIS — Z91048 Other nonmedicinal substance allergy status: Secondary | ICD-10-CM | POA: Diagnosis not present

## 2017-10-08 DIAGNOSIS — K839 Disease of biliary tract, unspecified: Secondary | ICD-10-CM | POA: Diagnosis not present

## 2017-10-08 DIAGNOSIS — M109 Gout, unspecified: Secondary | ICD-10-CM | POA: Diagnosis present

## 2017-10-08 DIAGNOSIS — Z888 Allergy status to other drugs, medicaments and biological substances status: Secondary | ICD-10-CM | POA: Diagnosis not present

## 2017-10-08 DIAGNOSIS — H409 Unspecified glaucoma: Secondary | ICD-10-CM | POA: Diagnosis present

## 2017-10-08 DIAGNOSIS — I714 Abdominal aortic aneurysm, without rupture: Secondary | ICD-10-CM | POA: Diagnosis present

## 2017-10-08 DIAGNOSIS — Z7982 Long term (current) use of aspirin: Secondary | ICD-10-CM | POA: Diagnosis not present

## 2017-10-08 DIAGNOSIS — K8050 Calculus of bile duct without cholangitis or cholecystitis without obstruction: Secondary | ICD-10-CM | POA: Diagnosis not present

## 2017-10-08 DIAGNOSIS — D649 Anemia, unspecified: Secondary | ICD-10-CM | POA: Diagnosis not present

## 2017-10-08 DIAGNOSIS — I129 Hypertensive chronic kidney disease with stage 1 through stage 4 chronic kidney disease, or unspecified chronic kidney disease: Secondary | ICD-10-CM | POA: Diagnosis not present

## 2017-10-08 DIAGNOSIS — I739 Peripheral vascular disease, unspecified: Secondary | ICD-10-CM | POA: Diagnosis present

## 2017-10-08 DIAGNOSIS — R109 Unspecified abdominal pain: Secondary | ICD-10-CM | POA: Diagnosis present

## 2017-10-08 DIAGNOSIS — Z9049 Acquired absence of other specified parts of digestive tract: Secondary | ICD-10-CM | POA: Diagnosis not present

## 2017-10-08 DIAGNOSIS — J90 Pleural effusion, not elsewhere classified: Secondary | ICD-10-CM | POA: Diagnosis present

## 2017-10-08 DIAGNOSIS — R001 Bradycardia, unspecified: Secondary | ICD-10-CM | POA: Diagnosis not present

## 2017-10-08 DIAGNOSIS — Z955 Presence of coronary angioplasty implant and graft: Secondary | ICD-10-CM | POA: Diagnosis not present

## 2017-10-08 DIAGNOSIS — N189 Chronic kidney disease, unspecified: Secondary | ICD-10-CM | POA: Diagnosis not present

## 2017-10-08 DIAGNOSIS — Z96642 Presence of left artificial hip joint: Secondary | ICD-10-CM | POA: Diagnosis present

## 2017-10-08 DIAGNOSIS — N184 Chronic kidney disease, stage 4 (severe): Secondary | ICD-10-CM | POA: Diagnosis present

## 2017-10-08 DIAGNOSIS — K838 Other specified diseases of biliary tract: Secondary | ICD-10-CM | POA: Diagnosis present

## 2017-10-08 DIAGNOSIS — K5710 Diverticulosis of small intestine without perforation or abscess without bleeding: Secondary | ICD-10-CM | POA: Diagnosis present

## 2017-10-08 DIAGNOSIS — Y838 Other surgical procedures as the cause of abnormal reaction of the patient, or of later complication, without mention of misadventure at the time of the procedure: Secondary | ICD-10-CM | POA: Diagnosis present

## 2017-10-08 DIAGNOSIS — E785 Hyperlipidemia, unspecified: Secondary | ICD-10-CM | POA: Diagnosis present

## 2017-10-08 LAB — COMPREHENSIVE METABOLIC PANEL
ALK PHOS: 130 U/L — AB (ref 38–126)
ALT: 20 U/L (ref 17–63)
ANION GAP: 8 (ref 5–15)
AST: 30 U/L (ref 15–41)
Albumin: 1.4 g/dL — ABNORMAL LOW (ref 3.5–5.0)
BILIRUBIN TOTAL: 1.8 mg/dL — AB (ref 0.3–1.2)
BUN: 36 mg/dL — ABNORMAL HIGH (ref 6–20)
CALCIUM: 8.1 mg/dL — AB (ref 8.9–10.3)
CO2: 22 mmol/L (ref 22–32)
Chloride: 112 mmol/L — ABNORMAL HIGH (ref 101–111)
Creatinine, Ser: 2.05 mg/dL — ABNORMAL HIGH (ref 0.61–1.24)
GFR calc Af Amer: 34 mL/min — ABNORMAL LOW (ref >60)
GFR, EST NON AFRICAN AMERICAN: 29 mL/min — AB (ref >60)
Glucose, Bld: 107 mg/dL — ABNORMAL HIGH (ref 65–99)
POTASSIUM: 4 mmol/L (ref 3.5–5.1)
Sodium: 142 mmol/L (ref 135–145)
TOTAL PROTEIN: 4.1 g/dL — AB (ref 6.5–8.1)

## 2017-10-08 LAB — CBC
HEMATOCRIT: 24.6 % — AB (ref 39.0–52.0)
HEMOGLOBIN: 8 g/dL — AB (ref 13.0–17.0)
MCH: 29.2 pg (ref 26.0–34.0)
MCHC: 32.5 g/dL (ref 30.0–36.0)
MCV: 89.8 fL (ref 78.0–100.0)
Platelets: 285 10*3/uL (ref 150–400)
RBC: 2.74 MIL/uL — ABNORMAL LOW (ref 4.22–5.81)
RDW: 16.2 % — ABNORMAL HIGH (ref 11.5–15.5)
WBC: 13.1 10*3/uL — AB (ref 4.0–10.5)

## 2017-10-08 MED ORDER — FUROSEMIDE 40 MG PO TABS
40.0000 mg | ORAL_TABLET | Freq: Every day | ORAL | Status: DC
  Administered 2017-10-08 – 2017-10-18 (×10): 40 mg via ORAL
  Filled 2017-10-08 (×10): qty 1

## 2017-10-08 MED ORDER — PIPERACILLIN-TAZOBACTAM 3.375 G IVPB 30 MIN
3.3750 g | Freq: Once | INTRAVENOUS | Status: AC
  Administered 2017-10-08: 3.375 g via INTRAVENOUS
  Filled 2017-10-08: qty 50

## 2017-10-08 MED ORDER — PIPERACILLIN-TAZOBACTAM 3.375 G IVPB
3.3750 g | Freq: Three times a day (TID) | INTRAVENOUS | Status: DC
  Administered 2017-10-08 – 2017-10-10 (×7): 3.375 g via INTRAVENOUS
  Filled 2017-10-08 (×9): qty 50

## 2017-10-08 NOTE — Progress Notes (Signed)
Patient ID: Terry Garner, male   DOB: Oct 05, 1940, 77 y.o.   MRN: [id number]       Subjective: Patient doesn't feel well today.  Has some abdominal pain.  No nausea.   Objective: Vital signs in last 24 hours: Temp:  [97.5 F (36.4 C)-98.8 F (37.1 C)] 98.8 F (37.1 C) (04/16 0622) Pulse Rate:  [57-67] 58 (04/16 0623) Resp:  [16-18] 16 (04/16 0623) BP: (136-161)/(54-60) 146/59 (04/16 0906) SpO2:  [92 %-100 %] 92 % (04/16 0623) Weight:  [82.1 kg (180 lb 14.4 oz)] 82.1 kg (180 lb 14.4 oz) (04/15 1146) Last BM Date: 10/06/17  Intake/Output from previous day: 04/15 0701 - 04/16 0700 In: 1102.5 [I.V.:1052.5; IV Piggyback:50] Out: 484.5 [Urine:475; Drains:9.5] Intake/Output this shift: Total I/O In: -  Out: 200 [Urine:200]  PE: Heart: regular Lungs: CTAB Abd: soft, mildly tender in RUQ, drain with minimal bilious output, 5cc this am, incisions are healing well.  Lab Results:  Recent Labs    10/07/17 1053 10/08/17 0325  WBC 18.9* 13.1*  HGB 8.4* 8.0*  HCT 25.4* 24.6*  PLT 259 285   BMET Recent Labs    10/07/17 1052 10/08/17 0325  NA 139 142  K 3.7 4.0  CL 109 112*  CO2 21* 22  GLUCOSE 103* 107*  BUN 38* 36*  CREATININE 2.28* 2.05*  CALCIUM 8.1* 8.1*   PT/INR No results for input(s): LABPROT, INR in the last 72 hours. CMP     Component Value Date/Time   NA 142 10/08/2017 0325   K 4.0 10/08/2017 0325   CL 112 (H) 10/08/2017 0325   CO2 22 10/08/2017 0325   GLUCOSE 107 (H) 10/08/2017 0325   BUN 36 (H) 10/08/2017 0325   CREATININE 2.05 (H) 10/08/2017 0325   CREATININE 2.80 (H) 07/15/2017 1405   CALCIUM 8.1 (L) 10/08/2017 0325   PROT 4.1 (L) 10/08/2017 0325   ALBUMIN 1.4 (L) 10/08/2017 0325   AST 30 10/08/2017 0325   ALT 20 10/08/2017 0325   ALKPHOS 130 (H) 10/08/2017 0325   BILITOT 1.8 (H) 10/08/2017 0325   GFRNONAA 29 (L) 10/08/2017 0325   GFRNONAA 21 (L) 07/15/2017 1405   GFRAA 34 (L) 10/08/2017 0325   GFRAA 24 (L) 07/15/2017 1405   Lipase       Component Value Date/Time   LIPASE 17 09/24/2017 0343       Studies/Results: Nm Hepatobiliary Liver Func  Result Date: 10/07/2017 CLINICAL DATA:  Recent cholecystectomy with concern for bile leak EXAM: NUCLEAR MEDICINE HEPATOBILIARY IMAGING TECHNIQUE: Sequential images of the abdomen in anterior projection were obtained out to 60 minutes following intravenous administration of radiopharmaceutical. RADIOPHARMACEUTICALS:  5.1 mCi Tc-47m  Choletec IV COMPARISON:  None. FINDINGS: Liver uptake of radiotracer is normal. There is prompt visualization of small bowel indicating patency of the common bile duct. Gallbladder is noted to be absent. There is radiotracer extending to the right and superiorly over the liver. It is felt that this ectopic radiotracer is evidence of a bile leak at the level of the cystic duct. IMPRESSION: Ectopic radiotracer seen in the right upper quadrant felt to represent bile leak. Gallbladder known to be absent. Prompt visualization of small bowel indicates patency of the common bile duct. These results will be called to the ordering clinician or representative by the Radiologist Assistant, and communication documented in the PACS or zVision Dashboard. Electronically Signed   By: Lowella Grip III M.D.   On: 10/07/2017 16:09   US Abdomen Limited Ruq  Result  Date: 10/07/2017 CLINICAL DATA:  77 year old male post cholecystectomy with findings on recent hepatobiliary scan raising possibility of bile leak. Subsequent encounter. EXAM: ULTRASOUND ABDOMEN LIMITED RIGHT UPPER QUADRANT COMPARISON:  10/07/2017 hepatic biliary scan. 09/25/2007 preoperative MR. FINDINGS: Gallbladder: Surgically absent. Common bile duct: Diameter: 6 mm. Liver: Complex abnormal fluid collection surrounds the liver and within the gallbladder fossa. This is worrisome for postoperative bile leak which may be coming loculated/infected. CT recommended for further delineation. Right-sided pleural effusion and  right lower lobe consolidation. This may reflect reactive changes or extension of postoperative infectious process/biloma. Portal vein is patent on color Doppler imaging with normal direction of blood flow towards the liver. IMPRESSION: Complex abnormal fluid collection surrounds the liver and within the gallbladder fossa. This is worrisome for postoperative bile leak which may be loculated/infected. CT recommended for further delineation. Right-sided pleural effusion and right lower lobe consolidation. This may reflect reactive changes or extension of postoperative infectious process/biloma. These results will be called to the ordering clinician or representative by the Radiologist Assistant, and communication documented in the PACS or zVision Dashboard. Electronically Signed   By: Genia Del M.D.   On: 10/07/2017 19:07    Anti-infectives: Anti-infectives (From admission, onward)   Start     Dose/Rate Route Frequency Ordered Stop   10/08/17 1600  piperacillin-tazobactam (ZOSYN) IVPB 3.375 g     3.375 g 12.5 mL/hr over 240 Minutes Intravenous Every 8 hours 10/08/17 0837     10/08/17 0900  piperacillin-tazobactam (ZOSYN) IVPB 3.375 g     3.375 g 100 mL/hr over 30 Minutes Intravenous  Once 10/08/17 0851 10/08/17 0948       Assessment/Plan  Bile leak, POD 12, s/p lap chole -HIDA was positive.  GI on board for ERCP tomorrow with Dr. Ardis Hughs.  Patient's last dose of plavix was on Sunday and is currently being held. -appreciate GI evaluation -US reveals biloma that is undrained.  Minimal output from JP drain.  IR consult to evaluate for drain of undrained biloma.  They have ordered a CT scan to further evaluate. -placed on zosyn due to biloma and elevated WBC  CKD - cr 2.08, BMET in am.  This appears to be around his baseline HTN/CAD - on home antihypertensives.  Resume lasix to help kidneys but avoid fluid overload   FEN - NPO for possible procedure today by IR, if unable will give clears and  NPO p MN for ERCP 4/17 VTE - SCDs/chemical prophylaxis can be started after all of his procedures ID - Zosyn   LOS: 1 day    Henreitta Cea , [facility] 10/08/2017, 10:53 AM Pager: [phone]

## 2017-10-08 NOTE — Consult Note (Signed)
Chief Complaint: Patient was seen in consultation today for biloma drain placement at the request of Dr Shann Medal   Supervising Physician: Jacqulynn Cadet  Patient Status: [facility] - In-pt  History of Present Illness: Terry Garner is a 77 y.o. male   Lap Chole 12 days ago Drain placed at time of surgery Not draining bile leak- biloma  CT today: Status post cholecystectomy. Large fluid collection is seen in gallbladder fossa which extends superiorly along lateral margin right hepatic lobe, and appears to be subcapsular in this area. This is most consistent with biloma. Surgical drain is seen passing through this fluid collection, although its distal tip is external to the fluid collection. Mild amount of perisplenic fluid is noted.  Now request for biloma drain placement in IR LD Plavix 4/14  Pt also scheduled for ERCP/stent placement tomorrow  Past Medical History:  Diagnosis Date  . AAA (abdominal aortic aneurysm) (HCC)    3.6 cm (01/09/12 ultrasound)  . Allergy   . Arthritis   . Bile duct leak 09/2017   post op  . Carotid artery disease ([place])   . Cataract   . Chronic kidney disease    CKD, right renal artery stenosis  . Coronary artery disease    mild left internal carotid artery stenosis  . Glaucoma   . Gout   . Heart murmur   . Hyperlipidemia   . Hypertension   . Tubular adenoma of colon 2003   Dr. Watt Climes  . Vitamin D deficiency     Past Surgical History:  Procedure Laterality Date  . BACK SURGERY    . CARDIAC CATHETERIZATION     stents  last -07  . CARDIAC STENTS    . CERVICAL DISC SURGERY    . CHOLECYSTECTOMY N/A 09/26/2017   Procedure: LAPAROSCOPIC CHOLECYSTECTOMY;  Surgeon: Rolm Bookbinder, MD;  Location: [facility];  Service: General;  Laterality: N/A;  . EYE SURGERY Left 06   retinal detachment  . HERNIA REPAIR Right 93  . LUMBAR LAMINECTOMY  06/06/2012   Procedure: MICRODISCECTOMY LUMBAR LAMINECTOMY;  Surgeon: Marybelle Killings, MD;  Location:  [facility];  Service: Orthopedics;  Laterality: Left;  Left L4-5 Microdiscectomy  . SHOULDER ARTHROSCOPY Right   . TOTAL HIP ARTHROPLASTY Left     Allergies: Toprol xl [metoprolol tartrate]; Lipitor [atorvastatin]; Coumadin [warfarin sodium]; Tape; and Warfarin  Medications: Prior to Admission medications   Medication Sig Start Date End Date Taking? Authorizing Provider  allopurinol (ZYLOPRIM) 300 MG tablet Take 300 mg by mouth daily.   Yes [provider]  amLODipine (NORVASC) 10 MG tablet Take 10 mg by mouth daily. 10/02/17  Yes [provider]  aspirin EC 81 MG tablet Take 81 mg by mouth daily.   Yes [provider]  brimonidine (ALPHAGAN) 0.15 % ophthalmic solution Place 1 drop into both eyes 2 (two) times daily.   Yes [provider]  Cholecalciferol (VITAMIN D3) 5000 UNITS TABS Take 5,000 Units by mouth daily.   Yes [provider]  clopidogrel (PLAVIX) 75 MG tablet TAKE 1 TABLET BY MOUTH ONCE DAILY FOR HEART STENTS Patient taking differently: TAKE 1 TABLET (75mg ) BY MOUTH ONCE DAILY FOR HEART STENTS 06/27/17  Yes [name], MD  dorzolamide (TRUSOPT) 2 % ophthalmic solution Place 1 drop into the right eye 3 (three) times daily.   Yes [provider]  fenofibrate micronized (LOFIBRA) 134 MG capsule TAKE 1 CAPSULE BY MOUTH ONCE DAILY 10/02/17  Yes [name], MD  furosemide (  LASIX) 40 MG tablet Take 40-80 mg by mouth daily. 80mg  in the morning and 40mg  in the evening 08/29/17  Yes [provider]  loteprednol (LOTEMAX) 0.5 % ophthalmic suspension Place 1 drop into the right eye 2 (two) times daily.    Yes [provider]  nepafenac (ILEVRO) 0.3 % ophthalmic suspension Place 1 drop into the right eye daily.   Yes [provider]  pravastatin (PRAVACHOL) 40 MG tablet TAKE ONE TABLET BY MOUTH AT BEDTIME FOR  CHOLESTEROL Patient taking differently: TAKE ONE TABLET (40mg ) BY MOUTH AT BEDTIME FOR  CHOLESTEROL  03/25/17  Yes [name], MD  timolol (BETIMOL) 0.5 % ophthalmic solution Place 1 drop into both eyes every morning.   Yes [provider]  traMADol (ULTRAM) 50 MG tablet Take 1 tablet (50 mg total) by mouth every 12 (twelve) hours as needed for severe pain. 10/01/17  Yes Jani Gravel, MD     Family History  Problem Relation Age of Onset  . Other Brother        laryngeal cancer  . Heart disease Brother   . Heart disease Mother   . Heart disease Father   . Stroke Father   . Heart disease Sister   . Diabetes Brother   . Colon cancer Neg Hx   . Esophageal cancer Neg Hx   . Stomach cancer Neg Hx   . Rectal cancer Neg Hx     Social History   Socioeconomic History  . Marital status: Married    Spouse name: Not on file  . Number of children: Not on file  . Years of education: Not on file  . Highest education level: Not on file  Occupational History  . Not on file  Social Needs  . Financial resource strain: Not on file  . Food insecurity:    Worry: Not on file    Inability: Not on file  . Transportation needs:    Medical: Not on file    Non-medical: Not on file  Tobacco Use  . Smoking status: Never Smoker  . Smokeless tobacco: Never Used  Substance and Sexual Activity  . Alcohol use: No  . Drug use: No  . Sexual activity: Not on file  Lifestyle  . Physical activity:    Days per week: Not on file    Minutes per session: Not on file  . Stress: Not on file  Relationships  . Social connections:    Talks on phone: Not on file    Gets together: Not on file    Attends religious service: Not on file    Active member of club or organization: Not on file    Attends meetings of clubs or organizations: Not on file    Relationship status: Not on file  Other Topics Concern  . Not on file  Social History Narrative  . Not on file    Review of Systems: A 12 point ROS discussed and pertinent positives are indicated in the HPI above.  All other systems are  negative.  Review of Systems  Constitutional: Positive for activity change, appetite change and fatigue. Negative for fever.  Respiratory: Negative for cough and shortness of breath.   Cardiovascular: Negative for chest pain.  Gastrointestinal: Positive for abdominal pain and nausea.  Neurological: Positive for weakness.  Psychiatric/Behavioral: Negative for behavioral problems and confusion.    Vital Signs: BP (!) 146/59   Pulse (!) 58   Temp 98.8 F (37.1 C) (Oral)   Resp  16   Ht 6\' 2"  (1.88 m)   Wt 180 lb 14.4 oz (82.1 kg)   SpO2 92%   BMI 23.23 kg/m   Physical Exam  Constitutional: He is oriented to person, place, and time.  Cardiovascular: Normal rate and regular rhythm.  Pulmonary/Chest: Effort normal.  Abdominal: Soft. There is tenderness.  Musculoskeletal: Normal range of motion.  Neurological: He is alert and oriented to person, place, and time.  Skin: Skin is warm and dry.  Psychiatric: He has a normal mood and affect. His behavior is normal. Judgment and thought content normal.  Consented with wife at bedside  Nursing note and vitals reviewed.   Imaging: Ct Abdomen Pelvis Wo Contrast  Result Date: 09/23/2017 CLINICAL DATA:  Abdominal pain.  Increasing abdominal pain EXAM: CT ABDOMEN AND PELVIS WITHOUT CONTRAST TECHNIQUE: Multidetector CT imaging of the abdomen and pelvis was performed following the standard protocol without IV contrast. COMPARISON:  None. FINDINGS: Lower chest: Mild bibasilar atelectasis. Hepatobiliary: No focal hepatic lesion on noncontrast exam. Gallbladder distended to 5.5 cm. No radiodense gallstones are present. Common bile duct appears normal caliber. Small amount of free fluid along the RIGHT hepatic margin Pancreas: Pancreas is normal. No ductal dilatation. No pancreatic inflammation. Spleen: Normal spleen Adrenals/urinary tract: Adrenal glands normal. No nephrolithiasis or ureterolithiasis. Vascular calcification in the RIGHT renal hilum. No  bladder calculi Stomach/Bowel: Stomach, small bowel, appendix, and cecum are normal. Multiple sigmoid diverticula. No acute diverticulitis. Vascular/Lymphatic: Abdominal aorta is normal caliber with atherosclerotic calcification. There is no retroperitoneal or periportal lymphadenopathy. No pelvic lymphadenopathy. Reproductive: Prostate normal Other: No free fluid. Musculoskeletal: No aggressive osseous lesion. IMPRESSION: 1. Gallbladder distension without of evidence gallbladder inflammation. No radiodense gallstones. Consider ultrasound if concern for cholecystitis. 2. Small free fluid along the RIGHT hepatic lobe. 3. Extensive sigmoid diverticulosis without evidence diverticulitis. 4.  Aortic Atherosclerosis (ICD10-I70.0). Electronically Signed   By: Suzy Bouchard M.D.   On: 09/23/2017 15:33   Ct Abdomen Wo Contrast  Result Date: 10/08/2017 CLINICAL DATA:  Biloma.  Acute generalized abdominal pain. EXAM: CT ABDOMEN WITHOUT CONTRAST TECHNIQUE: Multidetector CT imaging of the abdomen was performed following the standard protocol without IV contrast. COMPARISON:  CT scan of September 23, 2017. FINDINGS: Lower chest: Bibasilar subsegmental atelectasis or infiltrates are noted with associated pleural effusions. Hepatobiliary: Status post cholecystectomy. Large fluid collection is seen in gallbladder fossa which measures 7.6 x 6.3 cm. This fluid collection is seen to track along the lateral margin of the right hepatic lobe and appears to be subcapsular. Surgical drain is seen passing through this fluid collection in the gallbladder fossa, although the tip appears to be outside of the fluid collection. Pancreas: Unremarkable. No pancreatic ductal dilatation or surrounding inflammatory changes. Spleen: Normal in size without focal abnormality. Mild amount of perisplenic fluid is noted. Adrenals/Urinary Tract: Adrenal glands appear normal. Bilateral nonobstructive nephrolithiasis is noted. No hydronephrosis or renal  obstruction is noted. Stomach/Bowel: The stomach appears normal. There is no evidence of bowel obstruction or inflammation. Vascular/Lymphatic: 3.8 cm infrarenal abdominal aortic aneurysm is noted. Atherosclerosis of thoracic aorta is noted. No significant adenopathy is noted. Other: No abdominal wall hernia or abnormality. Musculoskeletal: No acute or significant osseous findings. IMPRESSION: Status post cholecystectomy. Large fluid collection is seen in gallbladder fossa which extends superiorly along lateral margin right hepatic lobe, and appears to be subcapsular in this area. This is most consistent with biloma. Surgical drain is seen passing through this fluid collection, although its distal tip is external to  the fluid collection. Mild amount of perisplenic fluid is noted. 3.8 cm infrarenal abdominal aortic aneurysm is noted. Recommend followup by [place] in 2 years. This recommendation follows ACR consensus guidelines: White Paper of the ACR Incidental Findings Committee II on Vascular Findings. J Am Coll Radiol 2013; 10:789-794. Electronically Signed   By: Marijo Conception, M.D.   On: 10/08/2017 13:16   Dg Chest 2 View  Result Date: 09/25/2017 CLINICAL DATA:  Chest pain. EXAM: CHEST - 2 VIEW COMPARISON:  Radiograph of September 23, 2017. FINDINGS: Stable cardiomediastinal silhouette. No pneumothorax is noted. Mild bibasilar subsegmental atelectasis is noted with minimal bilateral pleural effusions. Bony thorax is unremarkable. IMPRESSION: Mild bibasilar subsegmental atelectasis with minimal bilateral pleural effusions. Electronically Signed   By: Marijo Conception, M.D.   On: 09/25/2017 16:30   Nm Hepatobiliary Liver Func  Result Date: 10/07/2017 CLINICAL DATA:  Recent cholecystectomy with concern for bile leak EXAM: NUCLEAR MEDICINE HEPATOBILIARY IMAGING TECHNIQUE: Sequential images of the abdomen in anterior projection were obtained out to 60 minutes following intravenous administration of radiopharmaceutical.  RADIOPHARMACEUTICALS:  5.1 mCi Tc-61m  Choletec IV COMPARISON:  None. FINDINGS: Liver uptake of radiotracer is normal. There is prompt visualization of small bowel indicating patency of the common bile duct. Gallbladder is noted to be absent. There is radiotracer extending to the right and superiorly over the liver. It is felt that this ectopic radiotracer is evidence of a bile leak at the level of the cystic duct. IMPRESSION: Ectopic radiotracer seen in the right upper quadrant felt to represent bile leak. Gallbladder known to be absent. Prompt visualization of small bowel indicates patency of the common bile duct. These results will be called to the ordering clinician or representative by the Radiologist Assistant, and communication documented in the PACS or zVision Dashboard. Electronically Signed   By: Lowella Grip III M.D.   On: 10/07/2017 16:09   Mr Abdomen Mrcp Wo Contrast  Result Date: 09/24/2017 CLINICAL DATA:  Inpatient. Epigastric/right upper quadrant abdominal pain. Abnormal liver function tests. Cholelithiasis. EXAM: MRI ABDOMEN WITHOUT CONTRAST  (INCLUDING MRCP) TECHNIQUE: Multiplanar multisequence MR imaging of the abdomen was performed. Heavily T2-weighted images of the biliary and pancreatic ducts were obtained, and three-dimensional MRCP images were rendered by post processing. COMPARISON:  09/23/2017 unenhanced CT abdomen/pelvis and right upper quadrant abdominal sonogram. FINDINGS: Lower chest: Hypoventilatory changes in the dependent lung bases bilaterally. Hepatobiliary: Normal liver size and configuration. No hepatic steatosis. There is a sub 5 mm T2 hyperintense posterior right liver lobe lesion, incompletely characterized on this noncontrast scan, for which no follow-up is required unless the patient has risk factors for liver malignancy. No additional liver lesions. Distended gallbladder (5.3 cm diameter). Layering sludge and tiny 2-3 mm gallstones in the gallbladder. Borderline  mild diffuse gallbladder wall thickening (gallbladder wall thickness 3 mm). No pericholecystic fluid. No biliary ductal dilatation. Common bile duct diameter 6 mm. Small amount of layering sludge in the common bile duct, with no filling defects in the common bile duct to suggest choledocholithiasis. Small periampullary duodenal diverticulum. Pancreas: There is a 0.8 cm cystic pancreatic body lesion (series 13001/image [id number]) without appreciable wall thickening or internal complexity. No additional pancreatic lesions. No pancreatic duct dilation. No convincing pancreas divisum (the main pancreatic duct is drained by an accessory duct of [place], however also appears to communicate with the common bile duct). Spleen: Normal size. No mass. Adrenals/Urinary Tract: No adrenal nodules. No hydronephrosis. Normal kidneys with no renal mass. Stomach/Bowel: Grossly normal stomach.  Visualized small and large bowel is normal caliber, with no bowel wall thickening. Vascular/Lymphatic: Infrarenal 4.7 cm abdominal aortic aneurysm, stable. No pathologically enlarged lymph nodes in the abdomen. Other: Trace perihepatic ascites.  No focal fluid collection. Musculoskeletal: No aggressive appearing focal osseous lesions. Bilateral posterior spinal fusion hardware is visualized in the lower lumbar spine. IMPRESSION: 1. Layering sludge and tiny gallstones in the distended gallbladder with borderline mild diffuse gallbladder wall thickening. No pericholecystic fluid. These findings are nonspecific and could be compatible with acute cholecystitis in the correct clinical setting. 2. No biliary ductal dilatation. CBD diameter 6 mm. Small amount of layering sludge in the CBD, with no evidence of choledocholithiasis. 3. 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 Follow-up MRI abdomen without and with IV contrast recommended in 2 years. This recommendation follows ACR consensus guidelines: Management  of Incidental Pancreatic Cysts: A White Paper of the ACR Incidental Findings Committee. [place] 2130;86:578-469. 4. Infrarenal 4.7 cm abdominal aortic aneurysm. Recommend followup by abdomen and pelvis CTA in 6 months, and vascular surgery referral/consultation if not already obtained. This recommendation follows ACR consensus guidelines: White Paper of the ACR Incidental Findings Committee II on Vascular Findings. J Am Coll Radiol 2013; 10:789-794. 5. Trace perihepatic ascites. Electronically Signed   By: Ilona Sorrel M.D.   On: 09/24/2017 18:09   Mr 3d Recon At Scanner  Result Date: 09/24/2017 CLINICAL DATA:  Inpatient. Epigastric/right upper quadrant abdominal pain. Abnormal liver function tests. Cholelithiasis. EXAM: MRI ABDOMEN WITHOUT CONTRAST  (INCLUDING MRCP) TECHNIQUE: Multiplanar multisequence MR imaging of the abdomen was performed. Heavily T2-weighted images of the biliary and pancreatic ducts were obtained, and three-dimensional MRCP images were rendered by post processing. COMPARISON:  09/23/2017 unenhanced CT abdomen/pelvis and right upper quadrant abdominal sonogram. FINDINGS: Lower chest: Hypoventilatory changes in the dependent lung bases bilaterally. Hepatobiliary: Normal liver size and configuration. No hepatic steatosis. There is a sub 5 mm T2 hyperintense posterior right liver lobe lesion, incompletely characterized on this noncontrast scan, for which no follow-up is required unless the patient has risk factors for liver malignancy. No additional liver lesions. Distended gallbladder (5.3 cm diameter). Layering sludge and tiny 2-3 mm gallstones in the gallbladder. Borderline mild diffuse gallbladder wall thickening (gallbladder wall thickness 3 mm). No pericholecystic fluid. No biliary ductal dilatation. Common bile duct diameter 6 mm. Small amount of layering sludge in the common bile duct, with no filling defects in the common bile duct to suggest choledocholithiasis. Small  periampullary duodenal diverticulum. Pancreas: There is a 0.8 cm cystic pancreatic body lesion (series 13001/image 52) without appreciable wall thickening or internal complexity. No additional pancreatic lesions. No pancreatic duct dilation. No convincing pancreas divisum (the main pancreatic duct is drained by an accessory duct of [place], however also appears to communicate with the common bile duct). Spleen: Normal size. No mass. Adrenals/Urinary Tract: No adrenal nodules. No hydronephrosis. Normal kidneys with no renal mass. Stomach/Bowel: Grossly normal stomach. Visualized small and large bowel is normal caliber, with no bowel wall thickening. Vascular/Lymphatic: Infrarenal 4.7 cm abdominal aortic aneurysm, stable. No pathologically enlarged lymph nodes in the abdomen. Other: Trace perihepatic ascites.  No focal fluid collection. Musculoskeletal: No aggressive appearing focal osseous lesions. Bilateral posterior spinal fusion hardware is visualized in the lower lumbar spine. IMPRESSION: 1. Layering sludge and tiny gallstones in the distended gallbladder with borderline mild diffuse gallbladder wall thickening. No pericholecystic fluid. These findings are nonspecific and could be compatible with acute cholecystitis in the  correct clinical setting. 2. No biliary ductal dilatation. CBD diameter 6 mm. Small amount of layering sludge in the CBD, with no evidence of choledocholithiasis. 3. 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 Follow-up MRI abdomen without and with IV contrast recommended in 2 years. This recommendation follows ACR consensus guidelines: Management of Incidental Pancreatic Cysts: A White Paper of the ACR Incidental Findings Committee. [place] 1610;96:045-409. 4. Infrarenal 4.7 cm abdominal aortic aneurysm. Recommend followup by abdomen and pelvis CTA in 6 months, and vascular surgery referral/consultation if not already obtained. This  recommendation follows ACR consensus guidelines: White Paper of the ACR Incidental Findings Committee II on Vascular Findings. J Am Coll Radiol 2013; 10:789-794. 5. Trace perihepatic ascites. Electronically Signed   By: Ilona Sorrel M.D.   On: 09/24/2017 18:09   US Abdomen Limited  Result Date: 09/23/2017 CLINICAL DATA:  Right upper quadrant pain for several days EXAM: ULTRASOUND ABDOMEN LIMITED RIGHT UPPER QUADRANT COMPARISON:  CT from earlier in the same day. FINDINGS: Gallbladder: Well distended with gallstones and sludge. No pericholecystic fluid or gallbladder wall thickening is noted. Negative sonographic Percell Miller sign is noted. Common bile duct: Diameter: 5.2 mm. Liver: No focal lesion identified. Within normal limits in parenchymal echogenicity. Portal vein is patent on color Doppler imaging with normal direction of blood flow towards the liver. IMPRESSION: Cholelithiasis without definitive complicating factors. This may still be the etiology of the patient's discomfort. Electronically Signed   By: Inez Catalina M.D.   On: 09/23/2017 16:49   Dg Chest Portable 1 View  Result Date: 09/23/2017 CLINICAL DATA:  Shortness of breath and pain EXAM: PORTABLE CHEST 1 VIEW COMPARISON:  June 27, 2017 FINDINGS: There is no edema or consolidation. Heart size and pulmonary vascularity are normal. No adenopathy. No pneumothorax. There is aortic atherosclerosis. IMPRESSION: Aortic atherosclerosis.  No edema or consolidation. Aortic Atherosclerosis (ICD10-I70.0). Electronically Signed   By: Lowella Grip III M.D.   On: 09/23/2017 11:23   US Abdomen Limited Ruq  Result Date: 10/07/2017 CLINICAL DATA:  77 year old male post cholecystectomy with findings on recent hepatobiliary scan raising possibility of bile leak. Subsequent encounter. EXAM: ULTRASOUND ABDOMEN LIMITED RIGHT UPPER QUADRANT COMPARISON:  10/07/2017 hepatic biliary scan. 09/25/2007 preoperative MR. FINDINGS: Gallbladder: Surgically absent. Common bile  duct: Diameter: 6 mm. Liver: Complex abnormal fluid collection surrounds the liver and within the gallbladder fossa. This is worrisome for postoperative bile leak which may be coming loculated/infected. CT recommended for further delineation. Right-sided pleural effusion and right lower lobe consolidation. This may reflect reactive changes or extension of postoperative infectious process/biloma. Portal vein is patent on color Doppler imaging with normal direction of blood flow towards the liver. IMPRESSION: Complex abnormal fluid collection surrounds the liver and within the gallbladder fossa. This is worrisome for postoperative bile leak which may be loculated/infected. CT recommended for further delineation. Right-sided pleural effusion and right lower lobe consolidation. This may reflect reactive changes or extension of postoperative infectious process/biloma. These results will be called to the ordering clinician or representative by the Radiologist Assistant, and communication documented in the PACS or zVision Dashboard. Electronically Signed   By: Genia Del M.D.   On: 10/07/2017 19:07    Labs:  CBC: Recent Labs    09/30/17 0707 10/01/17 0356 10/07/17 1053 10/08/17 0325  WBC 8.4 10.7* 18.9* 13.1*  HGB 9.2* 9.4* 8.4* 8.0*  HCT 27.3* 28.9* 25.4* 24.6*  PLT 147* 182 259 285    COAGS: Recent Labs  09/23/17 1048  INR 1.17    BMP: Recent Labs    09/30/17 0707 10/01/17 0356 10/07/17 1052 10/08/17 0325  NA 139 139 139 142  K 3.8 3.9 3.7 4.0  CL 112* 112* 109 112*  CO2 18* 19* 21* 22  GLUCOSE 87 98 103* 107*  BUN 38* 32* 38* 36*  CALCIUM 7.7* 7.8* 8.1* 8.1*  CREATININE 2.16* 2.21* 2.28* 2.05*  GFRNONAA 28* 27* 26* 29*  GFRAA 32* 31* 30* 34*    LIVER FUNCTION TESTS: Recent Labs    09/30/17 0707 10/01/17 0356 10/07/17 1052 10/08/17 0325  BILITOT 2.5* 2.3* 1.9* 1.8*  AST 50* 50* 29 30  ALT 31 31 22 20   ALKPHOS 102 123 151* 130*  PROT 4.0* 4.1* 4.7* 4.1*  ALBUMIN  1.6* 1.5* 1.6* 1.4*    TUMOR MARKERS: No results for input(s): AFPTM, CEA, CA199, CHROMGRNA in the last 8760 hours.  Assessment and Plan:  Lap cholecystectomy 12 days ago Bile leak and biloma Surgical drain insufficient for drainage Scheduled for IR placed Biloma drain LD Plavix 4/14 Plan for drain tomorrow Risks and benefits discussed with the patient including bleeding, infection, damage to adjacent structures, bowel perforation/fistula connection, and sepsis.  All of the patient's questions were answered, patient is agreeable to proceed. Consent signed and in chart.  For ERCP/stent tomorrow also   Thank you for this interesting consult.  I greatly enjoyed meeting AAYANSH CODISPOTI and look forward to participating in their care.  A copy of this report was sent to the requesting provider on this date.  Electronically Signed: Lavonia Drafts, PA-C 10/08/2017, 2:44 PM   I spent a total of 40 Minutes    in face to face in clinical consultation, greater than 50% of which was counseling/coordinating care for biloma drain

## 2017-10-08 NOTE — Consult Note (Signed)
Referring Provider: [facility]  Primary Care Physician:  [name], MD Primary Gastroenterologist:  Lucio Edward,  MD  Reason for Consultation:   Bile duct leak   ASSESSMENT AND PLAN:    23. 77 yo male with post-op bile leak. He has surgical drain intact but minimal drainage and RUQ demonstrates fluid collection around liver and gb fossa.  -Patient needs ERCP with stent placement. Last plavix was Sunday. Will need to postpone ERCP until Thursday. The risks and benefits of the procedure were explained to patient and his family. Patient agrees to consent.  -continue Zosyn -Can eat from GI standpoint.  -IR was consulted by Surgery regarding drain  -Plavix is on hold  2. ? RLL consolidation / pleural effusion. Findings seen on RUQ u/s.but may just represent post changes or be part of biloma. No respiratory distress  3. Pancreatic lesion - 0.8cm cystic lesion in body on MRI, needs follow-up MRI April 2021  4. AAA, CKD, HTN.   HPI: Terry Garner is a 77 y.o. male with a hx of CKD, CAD, AAA  known to Dr. Fuller Plan for a hx of adenomatous colon polyps, he is up to date on surveillance colonoscopy.  Patient was admitted earlier this month with symptomatic cholelithiasis, questionable cholangitis.  His MRCP showed gallbladder sludge and mild gallbladder wall thickening.  There were no ductal abnormalities, no evidence for choledocholithiasis.  Patient was taken for laparoscopic subtotal cholecystectomy by Dr. [name] on 08-Oct-2017.  The gallbladder was dead, full of purulence.  The gallbladder was adherent to the bile duct, the back wall of the gallbladder was left in place.  Patient went for outpatient surgery follow-up yesterday. Apparently looked and felt unwell with significant abdominal pain. Patient was admitted for further workup.  He does have a surgical drain in place and per family it was putting out a lot of drainage at home. HIDA scan demonstrates a bile leak. RUQ u/s  demonstrates complex abnormal fluid collection around liver and in gallblader fossa. Having almost NO biliary drainage from perc drain now.  WBC 18K, down to 13K today on Zosyn. His pain is much better.    Past Medical History:  Diagnosis Date  . AAA (abdominal aortic aneurysm) (HCC)    3.6 cm (01/09/12 ultrasound)  . Allergy   . Arthritis   . Bile duct leak 09/2017   post op  . Carotid artery disease ([place])   . Cataract   . Chronic kidney disease    CKD, right renal artery stenosis  . Coronary artery disease    mild left internal carotid artery stenosis  . Glaucoma   . Gout   . Heart murmur   . Hyperlipidemia   . Hypertension   . Tubular adenoma of colon 2003   Dr. Watt Climes  . Vitamin D deficiency     Past Surgical History:  Procedure Laterality Date  . BACK SURGERY    . CARDIAC CATHETERIZATION     stents  last -07  . CARDIAC STENTS    . CERVICAL DISC SURGERY    . CHOLECYSTECTOMY N/A 10-08-17   Procedure: LAPAROSCOPIC CHOLECYSTECTOMY;  Surgeon: Rolm Bookbinder, MD;  Location: [place];  Service: General;  Laterality: N/A;  . EYE SURGERY Left 06   retinal detachment  . HERNIA REPAIR Right 93  . LUMBAR LAMINECTOMY  06/06/2012   Procedure: MICRODISCECTOMY LUMBAR LAMINECTOMY;  Surgeon: Marybelle Killings, MD;  Location: [place];  Service: Orthopedics;  Laterality: Left;  Left L4-5 Microdiscectomy  .  SHOULDER ARTHROSCOPY Right   . TOTAL HIP ARTHROPLASTY Left     Prior to Admission medications   Medication Sig Start Date End Date Taking? Authorizing Provider  allopurinol (ZYLOPRIM) 300 MG tablet Take 300 mg by mouth daily.   Yes [provider]  amLODipine (NORVASC) 10 MG tablet Take 10 mg by mouth daily. 10/02/17  Yes [provider]  aspirin EC 81 MG tablet Take 81 mg by mouth daily.   Yes [provider]  brimonidine (ALPHAGAN) 0.15 % ophthalmic solution Place 1 drop into both eyes 2 (two) times daily.   Yes [provider]  Cholecalciferol  (VITAMIN D3) 5000 UNITS TABS Take 5,000 Units by mouth daily.   Yes [provider]  clopidogrel (PLAVIX) 75 MG tablet TAKE 1 TABLET BY MOUTH ONCE DAILY FOR HEART STENTS Patient taking differently: TAKE 1 TABLET (75mg ) BY MOUTH ONCE DAILY FOR HEART STENTS 06/27/17  Yes [name], MD  dorzolamide (TRUSOPT) 2 % ophthalmic solution Place 1 drop into the right eye 3 (three) times daily.   Yes [provider]  fenofibrate micronized (LOFIBRA) 134 MG capsule TAKE 1 CAPSULE BY MOUTH ONCE DAILY 10/02/17  Yes [name], MD  furosemide (LASIX) 40 MG tablet Take 40-80 mg by mouth daily. 80mg  in the morning and 40mg  in the evening 08/29/17  Yes [provider]  loteprednol (LOTEMAX) 0.5 % ophthalmic suspension Place 1 drop into the right eye 2 (two) times daily.    Yes [provider]  nepafenac (ILEVRO) 0.3 % ophthalmic suspension Place 1 drop into the right eye daily.   Yes [provider]  pravastatin (PRAVACHOL) 40 MG tablet TAKE ONE TABLET BY MOUTH AT BEDTIME FOR  CHOLESTEROL Patient taking differently: TAKE ONE TABLET (40mg ) BY MOUTH AT BEDTIME FOR  CHOLESTEROL 03/25/17  Yes [name], MD  timolol (BETIMOL) 0.5 % ophthalmic solution Place 1 drop into both eyes every morning.   Yes [provider]  traMADol (ULTRAM) 50 MG tablet Take 1 tablet (50 mg total) by mouth every 12 (twelve) hours as needed for severe pain. 10/01/17  Yes Jani Gravel, MD    Current Facility-Administered Medications  Medication Dose Route Frequency Provider Last Rate Last Dose  . acetaminophen (TYLENOL) tablet 650 mg  650 mg Oral Q6H PRN Saverio Danker, PA-C       Or  . acetaminophen (TYLENOL) suppository 650 mg  650 mg Rectal Q6H PRN Saverio Danker, PA-C      . amLODipine (NORVASC) tablet 5 mg  5 mg Oral Daily Saverio Danker, PA-C   5 mg at 10/07/17 1551  . brimonidine (ALPHAGAN) 0.15 % ophthalmic solution 1 drop  1 drop Both Eyes BID Saverio Danker, PA-C   1 drop  at 10/07/17 2124  . dextrose 5 % and 0.45 % NaCl with KCl 20 mEq/L infusion   Intravenous Continuous Saverio Danker, PA-C 75 mL/hr at 10/07/17 1640    . diphenhydrAMINE (BENADRYL) 12.5 MG/5ML elixir 12.5 mg  12.5 mg Oral Q6H PRN Saverio Danker, PA-C       Or  . diphenhydrAMINE (BENADRYL) injection 12.5 mg  12.5 mg Intravenous Q6H PRN Saverio Danker, PA-C      . dorzolamide-timolol (COSOPT) 22.3-6.8 MG/ML ophthalmic solution 1 drop  1 drop Both Eyes BID Saverio Danker, PA-C   1 drop at 10/07/17 2124  . famotidine (PEPCID) IVPB 20 mg premix  20 mg Intravenous Q24H Saverio Danker, PA-C   Stopped at 10/07/17 1640  . loteprednol (LOTEMAX) 0.5 %  ophthalmic suspension 1 drop  1 drop Right Eye BID Saverio Danker, PA-C   1 drop at 10/07/17 2123  . morphine 2 MG/ML injection 1-2 mg  1-2 mg Intravenous Q3H PRN Saverio Danker, PA-C      . nepafenac (NEVANAC) 0.1 % ophthalmic suspension 1 drop  1 drop Right Eye TID Rolm Bookbinder, MD   1 drop at 10/07/17 2127  . ondansetron (ZOFRAN-ODT) disintegrating tablet 4 mg  4 mg Oral Q6H PRN Saverio Danker, PA-C       Or  . ondansetron [facility]) injection 4 mg  4 mg Intravenous Q6H PRN Saverio Danker, PA-C      . piperacillin-tazobactam (ZOSYN) IVPB 3.375 g  3.375 g Intravenous Q8H Saverio Danker, PA-C      . piperacillin-tazobactam (ZOSYN) IVPB 3.375 g  3.375 g Intravenous Once Georganna Skeans, MD      . polyethylene glycol (MIRALAX / GLYCOLAX) packet 17 g  17 g Oral Daily PRN Saverio Danker, PA-C      . simethicone [facility]) chewable tablet 40 mg  40 mg Oral Q6H PRN Saverio Danker, PA-C      . traMADol Veatrice Bourbon) tablet 50 mg  50 mg Oral Q6H PRN Saverio Danker, PA-C      . zolpidem (AMBIEN) tablet 5 mg  5 mg Oral QHS PRN Saverio Danker, PA-C        Allergies as of 10/07/2017 - Review Complete 10/07/2017  Allergen Reaction Noted  . Toprol xl [metoprolol tartrate] Other (See Comments) 01/15/2013  . Lipitor [atorvastatin]  10/21/2013  . Coumadin [warfarin sodium]  Rash 01/15/2013  . Tape Rash 06/04/2012  . Warfarin Rash 08/18/2015    Family History  Problem Relation Age of Onset  . Other Brother        laryngeal cancer  . Heart disease Brother   . Heart disease Mother   . Heart disease Father   . Stroke Father   . Heart disease Sister   . Diabetes Brother   . Colon cancer Neg Hx   . Esophageal cancer Neg Hx   . Stomach cancer Neg Hx   . Rectal cancer Neg Hx     Social History   Socioeconomic History  . Marital status: Married    Spouse name: Not on file  . Number of children: Not on file  . Years of education: Not on file  . Highest education level: Not on file  Occupational History  . Not on file  Social Needs  . Financial resource strain: Not on file  . Food insecurity:    Worry: Not on file    Inability: Not on file  . Transportation needs:    Medical: Not on file    Non-medical: Not on file  Tobacco Use  . Smoking status: Never Smoker  . Smokeless tobacco: Never Used  Substance and Sexual Activity  . Alcohol use: No  . Drug use: No  . Sexual activity: Not on file  Lifestyle  . Physical activity:    Days per week: Not on file    Minutes per session: Not on file  . Stress: Not on file  Relationships  . Social connections:    Talks on phone: Not on file    Gets together: Not on file    Attends religious service: Not on file    Active member of club or organization: Not on file    Attends meetings of clubs or organizations: Not on file    Relationship status: Not on file  .  Intimate partner violence:    Fear of current or ex partner: Not on file    Emotionally abused: Not on file    Physically abused: Not on file    Forced sexual activity: Not on file  Other Topics Concern  . Not on file  Social History Narrative  . Not on file    Review of Systems: All systems reviewed and negative except where noted in HPI.  Physical Exam: Vital signs in last 24 hours: Temp:  [97.5 F (36.4 C)-98.8 F (37.1 C)]  98.8 F (37.1 C) (04/16 0622) Pulse Rate:  [57-67] 58 (04/16 0623) Resp:  [16-18] 16 (04/16 0623) BP: (136-161)/(54-60) 153/55 (04/16 0623) SpO2:  [92 %-100 %] 92 % (04/16 0623) Weight:  [180 lb 14.4 oz (82.1 kg)] 180 lb 14.4 oz (82.1 kg) (04/15 1146) Last BM Date: 10/06/17 General:   Alert, well-developed, white male in NAD Psych:  Pleasant, cooperative. Normal mood and affect. Eyes:  Pupils equal, sclera clear, no icterus.   Conjunctiva pink. Ears:  Normal auditory acuity. Nose:  No deformity, discharge,  or lesions. Neck:  Supple; no masses Lungs:  Clear throughout to auscultation.   No wheezes, crackles, or rhonchi.  Heart:  Regular rate and rhythm; + murmur, 1-2+ BLE edema Abdomen:  Soft, non-distended, nontender, BS active, no palp mass . A few ml of bilious drainage in bulb   Rectal:  Deferred  Msk:  Symmetrical without gross deformities. . Neurologic:  Alert and  oriented x4;  grossly normal neurologically. Skin:  Intact without significant lesions or rashes..   Intake/Output from previous day: 04/15 0701 - 04/16 0700 In: 1102.5 [I.V.:1052.5; IV Piggyback:50] Out: 484.5 [Urine:475; Drains:9.5] Intake/Output this shift: No intake/output data recorded.  Lab Results: Recent Labs    10/07/17 1053 10/08/17 0325  WBC 18.9* 13.1*  HGB 8.4* 8.0*  HCT 25.4* 24.6*  PLT 259 285   BMET Recent Labs    10/07/17 1052 10/08/17 0325  NA 139 142  K 3.7 4.0  CL 109 112*  CO2 21* 22  GLUCOSE 103* 107*  BUN 38* 36*  CREATININE 2.28* 2.05*  CALCIUM 8.1* 8.1*   LFT Recent Labs    10/08/17 0325  PROT 4.1*  ALBUMIN 1.4*  AST 30  ALT 20  ALKPHOS 130*  BILITOT 1.8*    Studies/Results: Nm Hepatobiliary Liver Func  Result Date: 10/07/2017 CLINICAL DATA:  Recent cholecystectomy with concern for bile leak EXAM: NUCLEAR MEDICINE HEPATOBILIARY IMAGING TECHNIQUE: Sequential images of the abdomen in anterior projection were obtained out to 60 minutes following intravenous  administration of radiopharmaceutical. RADIOPHARMACEUTICALS:  5.1 mCi Tc-69m  Choletec IV COMPARISON:  None. FINDINGS: Liver uptake of radiotracer is normal. There is prompt visualization of small bowel indicating patency of the common bile duct. Gallbladder is noted to be absent. There is radiotracer extending to the right and superiorly over the liver. It is felt that this ectopic radiotracer is evidence of a bile leak at the level of the cystic duct. IMPRESSION: Ectopic radiotracer seen in the right upper quadrant felt to represent bile leak. Gallbladder known to be absent. Prompt visualization of small bowel indicates patency of the common bile duct. These results will be called to the ordering clinician or representative by the Radiologist Assistant, and communication documented in the PACS or zVision Dashboard. Electronically Signed   By: Lowella Grip III M.D.   On: 10/07/2017 16:09   US Abdomen Limited Ruq  Result Date: 10/07/2017 CLINICAL DATA:  77 year old male post  cholecystectomy with findings on recent hepatobiliary scan raising possibility of bile leak. Subsequent encounter. EXAM: ULTRASOUND ABDOMEN LIMITED RIGHT UPPER QUADRANT COMPARISON:  10/07/2017 hepatic biliary scan. 09/25/2007 preoperative MR. FINDINGS: Gallbladder: Surgically absent. Common bile duct: Diameter: 6 mm. Liver: Complex abnormal fluid collection surrounds the liver and within the gallbladder fossa. This is worrisome for postoperative bile leak which may be coming loculated/infected. CT recommended for further delineation. Right-sided pleural effusion and right lower lobe consolidation. This may reflect reactive changes or extension of postoperative infectious process/biloma. Portal vein is patent on color Doppler imaging with normal direction of blood flow towards the liver. IMPRESSION: Complex abnormal fluid collection surrounds the liver and within the gallbladder fossa. This is worrisome for postoperative bile leak which  may be loculated/infected. CT recommended for further delineation. Right-sided pleural effusion and right lower lobe consolidation. This may reflect reactive changes or extension of postoperative infectious process/biloma. These results will be called to the ordering clinician or representative by the Radiologist Assistant, and communication documented in the PACS or zVision Dashboard. Electronically Signed   By: Genia Del M.D.   On: 10/07/2017 19:07    Tye Savoy, NP-C @  10/08/2017, 8:57 AM  Pager number [phone]

## 2017-10-08 NOTE — H&P (View-Only) (Signed)
Referring Provider: [facility]  Primary Care Physician:  [name], MD Primary Gastroenterologist:  Lucio Edward,  MD  Reason for Consultation:   Bile duct leak   ASSESSMENT AND PLAN:    10. 77 yo male with post-op bile leak. He has surgical drain intact but minimal drainage and RUQ demonstrates fluid collection around liver and gb fossa.  -Patient needs ERCP with stent placement. Last plavix was Sunday. Will need to postpone ERCP until Thursday. The risks and benefits of the procedure were explained to patient and his family. Patient agrees to consent.  -continue Zosyn -Can eat from GI standpoint.  -IR was consulted by Surgery regarding drain  -Plavix is on hold  2. ? RLL consolidation / pleural effusion. Findings seen on RUQ u/s.but may just represent post changes or be part of biloma. No respiratory distress  3. Pancreatic lesion - 0.8cm cystic lesion in body on MRI, needs follow-up MRI April 2021  4. AAA, CKD, HTN.   HPI: Terry Garner is a 77 y.o. male with a hx of CKD, CAD, AAA  known to Dr. Fuller Plan for a hx of adenomatous colon polyps, he is up to date on surveillance colonoscopy.  Patient was admitted earlier this month with symptomatic cholelithiasis, questionable cholangitis.  His MRCP showed gallbladder sludge and mild gallbladder wall thickening.  There were no ductal abnormalities, no evidence for choledocholithiasis.  Patient was taken for laparoscopic subtotal cholecystectomy by Dr. [name] on 10-05-2017.  The gallbladder was dead, full of purulence.  The gallbladder was adherent to the bile duct, the back wall of the gallbladder was left in place.  Patient went for outpatient surgery follow-up yesterday. Apparently looked and felt unwell with significant abdominal pain. Patient was admitted for further workup.  He does have a surgical drain in place and per family it was putting out a lot of drainage at home. HIDA scan demonstrates a bile leak. RUQ u/s  demonstrates complex abnormal fluid collection around liver and in gallblader fossa. Having almost NO biliary drainage from perc drain now.  WBC 18K, down to 13K today on Zosyn. His pain is much better.    Past Medical History:  Diagnosis Date  . AAA (abdominal aortic aneurysm) (HCC)    3.6 cm (01/09/12 ultrasound)  . Allergy   . Arthritis   . Bile duct leak 09/2017   post op  . Carotid artery disease ([place])   . Cataract   . Chronic kidney disease    CKD, right renal artery stenosis  . Coronary artery disease    mild left internal carotid artery stenosis  . Glaucoma   . Gout   . Heart murmur   . Hyperlipidemia   . Hypertension   . Tubular adenoma of colon 2003   Dr. Watt Climes  . Vitamin D deficiency     Past Surgical History:  Procedure Laterality Date  . BACK SURGERY    . CARDIAC CATHETERIZATION     stents  last -07  . CARDIAC STENTS    . CERVICAL DISC SURGERY    . CHOLECYSTECTOMY N/A 2017/10/05   Procedure: LAPAROSCOPIC CHOLECYSTECTOMY;  Surgeon: Rolm Bookbinder, MD;  Location: [place];  Service: General;  Laterality: N/A;  . EYE SURGERY Left 06   retinal detachment  . HERNIA REPAIR Right 93  . LUMBAR LAMINECTOMY  06/06/2012   Procedure: MICRODISCECTOMY LUMBAR LAMINECTOMY;  Surgeon: Marybelle Killings, MD;  Location: [place];  Service: Orthopedics;  Laterality: Left;  Left L4-5 Microdiscectomy  .  SHOULDER ARTHROSCOPY Right   . TOTAL HIP ARTHROPLASTY Left     Prior to Admission medications   Medication Sig Start Date End Date Taking? Authorizing Provider  allopurinol (ZYLOPRIM) 300 MG tablet Take 300 mg by mouth daily.   Yes [provider]  amLODipine (NORVASC) 10 MG tablet Take 10 mg by mouth daily. 10/02/17  Yes [provider]  aspirin EC 81 MG tablet Take 81 mg by mouth daily.   Yes [provider]  brimonidine (ALPHAGAN) 0.15 % ophthalmic solution Place 1 drop into both eyes 2 (two) times daily.   Yes [provider]  Cholecalciferol  (VITAMIN D3) 5000 UNITS TABS Take 5,000 Units by mouth daily.   Yes [provider]  clopidogrel (PLAVIX) 75 MG tablet TAKE 1 TABLET BY MOUTH ONCE DAILY FOR HEART STENTS Patient taking differently: TAKE 1 TABLET (75mg ) BY MOUTH ONCE DAILY FOR HEART STENTS 06/27/17  Yes [name], MD  dorzolamide (TRUSOPT) 2 % ophthalmic solution Place 1 drop into the right eye 3 (three) times daily.   Yes [provider]  fenofibrate micronized (LOFIBRA) 134 MG capsule TAKE 1 CAPSULE BY MOUTH ONCE DAILY 10/02/17  Yes [name], MD  furosemide (LASIX) 40 MG tablet Take 40-80 mg by mouth daily. 80mg  in the morning and 40mg  in the evening 08/29/17  Yes [provider]  loteprednol (LOTEMAX) 0.5 % ophthalmic suspension Place 1 drop into the right eye 2 (two) times daily.    Yes [provider]  nepafenac (ILEVRO) 0.3 % ophthalmic suspension Place 1 drop into the right eye daily.   Yes [provider]  pravastatin (PRAVACHOL) 40 MG tablet TAKE ONE TABLET BY MOUTH AT BEDTIME FOR  CHOLESTEROL Patient taking differently: TAKE ONE TABLET (40mg ) BY MOUTH AT BEDTIME FOR  CHOLESTEROL 03/25/17  Yes [name], MD  timolol (BETIMOL) 0.5 % ophthalmic solution Place 1 drop into both eyes every morning.   Yes [provider]  traMADol (ULTRAM) 50 MG tablet Take 1 tablet (50 mg total) by mouth every 12 (twelve) hours as needed for severe pain. 10/01/17  Yes Jani Gravel, MD    Current Facility-Administered Medications  Medication Dose Route Frequency Provider Last Rate Last Dose  . acetaminophen (TYLENOL) tablet 650 mg  650 mg Oral Q6H PRN Saverio Danker, PA-C       Or  . acetaminophen (TYLENOL) suppository 650 mg  650 mg Rectal Q6H PRN Saverio Danker, PA-C      . amLODipine (NORVASC) tablet 5 mg  5 mg Oral Daily Saverio Danker, PA-C   5 mg at 10/07/17 1551  . brimonidine (ALPHAGAN) 0.15 % ophthalmic solution 1 drop  1 drop Both Eyes BID Saverio Danker, PA-C   1 drop  at 10/07/17 2124  . dextrose 5 % and 0.45 % NaCl with KCl 20 mEq/L infusion   Intravenous Continuous Saverio Danker, PA-C 75 mL/hr at 10/07/17 1640    . diphenhydrAMINE (BENADRYL) 12.5 MG/5ML elixir 12.5 mg  12.5 mg Oral Q6H PRN Saverio Danker, PA-C       Or  . diphenhydrAMINE (BENADRYL) injection 12.5 mg  12.5 mg Intravenous Q6H PRN Saverio Danker, PA-C      . dorzolamide-timolol (COSOPT) 22.3-6.8 MG/ML ophthalmic solution 1 drop  1 drop Both Eyes BID Saverio Danker, PA-C   1 drop at 10/07/17 2124  . famotidine (PEPCID) IVPB 20 mg premix  20 mg Intravenous Q24H Saverio Danker, PA-C   Stopped at 10/07/17 1640  . loteprednol (LOTEMAX) 0.5 %  ophthalmic suspension 1 drop  1 drop Right Eye BID Saverio Danker, PA-C   1 drop at 10/07/17 2123  . morphine 2 MG/ML injection 1-2 mg  1-2 mg Intravenous Q3H PRN Saverio Danker, PA-C      . nepafenac (NEVANAC) 0.1 % ophthalmic suspension 1 drop  1 drop Right Eye TID Rolm Bookbinder, MD   1 drop at 10/07/17 2127  . ondansetron (ZOFRAN-ODT) disintegrating tablet 4 mg  4 mg Oral Q6H PRN Saverio Danker, PA-C       Or  . ondansetron [facility]) injection 4 mg  4 mg Intravenous Q6H PRN Saverio Danker, PA-C      . piperacillin-tazobactam (ZOSYN) IVPB 3.375 g  3.375 g Intravenous Q8H Saverio Danker, PA-C      . piperacillin-tazobactam (ZOSYN) IVPB 3.375 g  3.375 g Intravenous Once Georganna Skeans, MD      . polyethylene glycol (MIRALAX / GLYCOLAX) packet 17 g  17 g Oral Daily PRN Saverio Danker, PA-C      . simethicone [facility]) chewable tablet 40 mg  40 mg Oral Q6H PRN Saverio Danker, PA-C      . traMADol [facility]at[facility] Bourbon) tablet 50 mg  50 mg Oral Q6H PRN Saverio Danker, PA-C      . zolpidem (AMBIEN) tablet 5 mg  5 mg Oral QHS PRN Saverio Danker, PA-C        Allergies as of 10/07/2017 - Review Complete 10/07/2017  Allergen Reaction Noted  . Toprol xl [metoprolol tartrate] Other (See Comments) 01/15/2013  . Lipitor [atorvastatin]  10/21/2013  . Coumadin [warfarin sodium]  Rash 01/15/2013  . Tape Rash 06/04/2012  . Warfarin Rash 08/18/2015    Family History  Problem Relation Age of Onset  . Other Brother        laryngeal cancer  . Heart disease Brother   . Heart disease Mother   . Heart disease Father   . Stroke Father   . Heart disease Sister   . Diabetes Brother   . Colon cancer Neg Hx   . Esophageal cancer Neg Hx   . Stomach cancer Neg Hx   . Rectal cancer Neg Hx     Social History   Socioeconomic History  . Marital status: Married    Spouse name: Not on file  . Number of children: Not on file  . Years of education: Not on file  . Highest education level: Not on file  Occupational History  . Not on file  Social Needs  . Financial resource strain: Not on file  . Food insecurity:    Worry: Not on file    Inability: Not on file  . Transportation needs:    Medical: Not on file    Non-medical: Not on file  Tobacco Use  . Smoking status: Never Smoker  . Smokeless tobacco: Never Used  Substance and Sexual Activity  . Alcohol use: No  . Drug use: No  . Sexual activity: Not on file  Lifestyle  . Physical activity:    Days per week: Not on file    Minutes per session: Not on file  . Stress: Not on file  Relationships  . Social connections:    Talks on phone: Not on file    Gets together: Not on file    Attends religious service: Not on file    Active member of club or organization: Not on file    Attends meetings of clubs or organizations: Not on file    Relationship status: Not on file  .  Intimate partner violence:    Fear of current or ex partner: Not on file    Emotionally abused: Not on file    Physically abused: Not on file    Forced sexual activity: Not on file  Other Topics Concern  . Not on file  Social History Narrative  . Not on file    Review of Systems: All systems reviewed and negative except where noted in HPI.  Physical Exam: Vital signs in last 24 hours: Temp:  [97.5 F (36.4 C)-98.8 F (37.1 C)]  98.8 F (37.1 C) (04/16 0622) Pulse Rate:  [57-67] 58 (04/16 0623) Resp:  [16-18] 16 (04/16 0623) BP: (136-161)/(54-60) 153/55 (04/16 0623) SpO2:  [92 %-100 %] 92 % (04/16 0623) Weight:  [180 lb 14.4 oz (82.1 kg)] 180 lb 14.4 oz (82.1 kg) (04/15 1146) Last BM Date: 10/06/17 General:   Alert, well-developed, white male in NAD Psych:  Pleasant, cooperative. Normal mood and affect. Eyes:  Pupils equal, sclera clear, no icterus.   Conjunctiva pink. Ears:  Normal auditory acuity. Nose:  No deformity, discharge,  or lesions. Neck:  Supple; no masses Lungs:  Clear throughout to auscultation.   No wheezes, crackles, or rhonchi.  Heart:  Regular rate and rhythm; + murmur, 1-2+ BLE edema Abdomen:  Soft, non-distended, nontender, BS active, no palp mass . A few ml of bilious drainage in bulb   Rectal:  Deferred  Msk:  Symmetrical without gross deformities. . Neurologic:  Alert and  oriented x4;  grossly normal neurologically. Skin:  Intact without significant lesions or rashes..   Intake/Output from previous day: 04/15 0701 - 04/16 0700 In: 1102.5 [I.V.:1052.5; IV Piggyback:50] Out: 484.5 [Urine:475; Drains:9.5] Intake/Output this shift: No intake/output data recorded.  Lab Results: Recent Labs    10/07/17 1053 10/08/17 0325  WBC 18.9* 13.1*  HGB 8.4* 8.0*  HCT 25.4* 24.6*  PLT 259 285   BMET Recent Labs    10/07/17 1052 10/08/17 0325  NA 139 142  K 3.7 4.0  CL 109 112*  CO2 21* 22  GLUCOSE 103* 107*  BUN 38* 36*  CREATININE 2.28* 2.05*  CALCIUM 8.1* 8.1*   LFT Recent Labs    10/08/17 0325  PROT 4.1*  ALBUMIN 1.4*  AST 30  ALT 20  ALKPHOS 130*  BILITOT 1.8*    Studies/Results: Nm Hepatobiliary Liver Func  Result Date: 10/07/2017 CLINICAL DATA:  Recent cholecystectomy with concern for bile leak EXAM: NUCLEAR MEDICINE HEPATOBILIARY IMAGING TECHNIQUE: Sequential images of the abdomen in anterior projection were obtained out to 60 minutes following intravenous  administration of radiopharmaceutical. RADIOPHARMACEUTICALS:  5.1 mCi Tc-44m  Choletec IV COMPARISON:  None. FINDINGS: Liver uptake of radiotracer is normal. There is prompt visualization of small bowel indicating patency of the common bile duct. Gallbladder is noted to be absent. There is radiotracer extending to the right and superiorly over the liver. It is felt that this ectopic radiotracer is evidence of a bile leak at the level of the cystic duct. IMPRESSION: Ectopic radiotracer seen in the right upper quadrant felt to represent bile leak. Gallbladder known to be absent. Prompt visualization of small bowel indicates patency of the common bile duct. These results will be called to the ordering clinician or representative by the Radiologist Assistant, and communication documented in the PACS or zVision Dashboard. Electronically Signed   By: Lowella Grip III M.D.   On: 10/07/2017 16:09   US Abdomen Limited Ruq  Result Date: 10/07/2017 CLINICAL DATA:  77 year old male post  cholecystectomy with findings on recent hepatobiliary scan raising possibility of bile leak. Subsequent encounter. EXAM: ULTRASOUND ABDOMEN LIMITED RIGHT UPPER QUADRANT COMPARISON:  10/07/2017 hepatic biliary scan. 09/25/2007 preoperative MR. FINDINGS: Gallbladder: Surgically absent. Common bile duct: Diameter: 6 mm. Liver: Complex abnormal fluid collection surrounds the liver and within the gallbladder fossa. This is worrisome for postoperative bile leak which may be coming loculated/infected. CT recommended for further delineation. Right-sided pleural effusion and right lower lobe consolidation. This may reflect reactive changes or extension of postoperative infectious process/biloma. Portal vein is patent on color Doppler imaging with normal direction of blood flow towards the liver. IMPRESSION: Complex abnormal fluid collection surrounds the liver and within the gallbladder fossa. This is worrisome for postoperative bile leak which  may be loculated/infected. CT recommended for further delineation. Right-sided pleural effusion and right lower lobe consolidation. This may reflect reactive changes or extension of postoperative infectious process/biloma. These results will be called to the ordering clinician or representative by the Radiologist Assistant, and communication documented in the PACS or zVision Dashboard. Electronically Signed   By: Genia Del M.D.   On: 10/07/2017 19:07    Tye Savoy, NP-C @  10/08/2017, 8:57 AM  Pager number [phone]

## 2017-10-09 ENCOUNTER — Inpatient Hospital Stay (HOSPITAL_COMMUNITY): Payer: Medicare Other | Admitting: Anesthesiology

## 2017-10-09 ENCOUNTER — Inpatient Hospital Stay (HOSPITAL_COMMUNITY): Payer: Medicare Other

## 2017-10-09 ENCOUNTER — Encounter (HOSPITAL_COMMUNITY): Payer: Self-pay | Admitting: *Deleted

## 2017-10-09 ENCOUNTER — Encounter (HOSPITAL_COMMUNITY)
Admission: AD | Disposition: A | Discharge status: Home Health | Payer: Self-pay | Source: Ambulatory Visit | Attending: Surgery

## 2017-10-09 ENCOUNTER — Ambulatory Visit: Payer: Self-pay | Admitting: Physician Assistant

## 2017-10-09 DIAGNOSIS — K839 Disease of biliary tract, unspecified: Secondary | ICD-10-CM

## 2017-10-09 HISTORY — PX: ERCP: SHX5425

## 2017-10-09 LAB — CBC
HCT: 24.5 % — ABNORMAL LOW (ref 39.0–52.0)
HEMOGLOBIN: 8.2 g/dL — AB (ref 13.0–17.0)
MCH: 30.1 pg (ref 26.0–34.0)
MCHC: 33.5 g/dL (ref 30.0–36.0)
MCV: 90.1 fL (ref 78.0–100.0)
Platelets: 242 10*3/uL (ref 150–400)
RBC: 2.72 MIL/uL — AB (ref 4.22–5.81)
RDW: 16.1 % — ABNORMAL HIGH (ref 11.5–15.5)
WBC: 9.1 10*3/uL (ref 4.0–10.5)

## 2017-10-09 LAB — PROTIME-INR
INR: 1.27
Prothrombin Time: 15.8 seconds — ABNORMAL HIGH (ref 11.4–15.2)

## 2017-10-09 SURGERY — ERCP, WITH INTERVENTION IF INDICATED
Anesthesia: General

## 2017-10-09 MED ORDER — DEXAMETHASONE SODIUM PHOSPHATE 10 MG/ML IJ SOLN
INTRAMUSCULAR | Status: DC | PRN
  Administered 2017-10-09: 10 mg via INTRAVENOUS

## 2017-10-09 MED ORDER — INDOMETHACIN 50 MG RE SUPP
RECTAL | Status: AC
  Filled 2017-10-09: qty 2

## 2017-10-09 MED ORDER — PROPOFOL 10 MG/ML IV BOLUS
INTRAVENOUS | Status: DC | PRN
  Administered 2017-10-09: 30 mg via INTRAVENOUS
  Administered 2017-10-09: 120 mg via INTRAVENOUS

## 2017-10-09 MED ORDER — LACTATED RINGERS IV SOLN
INTRAVENOUS | Status: AC | PRN
  Administered 2017-10-09: 1000 mL via INTRAVENOUS

## 2017-10-09 MED ORDER — ROCURONIUM BROMIDE 100 MG/10ML IV SOLN
INTRAVENOUS | Status: DC | PRN
  Administered 2017-10-09: 40 mg via INTRAVENOUS

## 2017-10-09 MED ORDER — IOPAMIDOL (ISOVUE-300) INJECTION 61%
INTRAVENOUS | Status: AC
  Filled 2017-10-09: qty 50

## 2017-10-09 MED ORDER — LIDOCAINE HCL (CARDIAC) PF 100 MG/5ML IV SOSY
PREFILLED_SYRINGE | INTRAVENOUS | Status: DC | PRN
  Administered 2017-10-09: 100 mg via INTRAVENOUS

## 2017-10-09 MED ORDER — SUGAMMADEX SODIUM 200 MG/2ML IV SOLN
INTRAVENOUS | Status: DC | PRN
  Administered 2017-10-09: 200 mg via INTRAVENOUS

## 2017-10-09 MED ORDER — EPHEDRINE SULFATE 50 MG/ML IJ SOLN
INTRAMUSCULAR | Status: DC | PRN
  Administered 2017-10-09 (×3): 10 mg via INTRAVENOUS

## 2017-10-09 MED ORDER — GLUCAGON HCL RDNA (DIAGNOSTIC) 1 MG IJ SOLR
INTRAMUSCULAR | Status: AC
  Filled 2017-10-09: qty 1

## 2017-10-09 MED ORDER — INDOMETHACIN 50 MG RE SUPP
RECTAL | Status: DC | PRN
  Administered 2017-10-09: 100 mg via RECTAL

## 2017-10-09 MED ORDER — NEOSTIGMINE METHYLSULFATE 10 MG/10ML IV SOLN
INTRAVENOUS | Status: DC | PRN
  Administered 2017-10-09: 4 mg via INTRAVENOUS

## 2017-10-09 NOTE — Interval H&P Note (Signed)
History and Physical Interval Note:  10/09/2017 12:42 PM  Terry Garner  has presented today for surgery, with the diagnosis of bile duck leak  The various methods of treatment have been discussed with the patient and family. After consideration of risks, benefits and other options for treatment, the patient has consented to  Procedure(s): ENDOSCOPIC RETROGRADE CHOLANGIOPANCREATOGRAPHY (ERCP) (N/A) as a surgical intervention .  The patient's history has been reviewed, patient examined, no change in status, stable for surgery.  I have reviewed the patient's chart and labs.  Questions were answered to the patient's satisfaction.     Milus Banister

## 2017-10-09 NOTE — Anesthesia Postprocedure Evaluation (Signed)
Anesthesia Post Note  Patient: Terry Garner  Procedure(s) Performed: ENDOSCOPIC RETROGRADE CHOLANGIOPANCREATOGRAPHY (ERCP) (N/A )     Patient location during evaluation: PACU Anesthesia Type: General Level of consciousness: sedated and patient cooperative Pain management: pain level controlled Vital Signs Assessment: post-procedure vital signs reviewed and stable Respiratory status: spontaneous breathing Cardiovascular status: stable Anesthetic complications: no    Last Vitals:  Vitals:   10/09/17 1505 10/09/17 1515  BP: (!) 180/50 (!) 172/49  Pulse: (!) 57 (!) 59  Resp: 19 (!) 23  Temp:    SpO2: 97% 97%    Last Pain:  Vitals:   10/09/17 1515  TempSrc:   PainSc: 0-No pain                 Nolon Nations

## 2017-10-09 NOTE — Op Note (Addendum)
[facility] Patient Name: Terry Garner Procedure Date : 10/09/2017 MRN: [id number] Attending MD: Milus Banister , MD Date of Birth: [birth year] CSN: [id number] Age: 77 Admit Type: Inpatient Procedure:                ERCP Indications:              Bile leak Providers:                Milus Banister, MD, Cleda Daub, RN, Elspeth Cho Tech., Technician, Gershon Crane CRNA, CRNA Referring MD:              Medicines:                General Anesthesia, Indomethacin 591 mg PR Complications:            No immediate complications. Estimated blood loss:                            None Estimated Blood Loss:     Estimated blood loss: none. Procedure:                Pre-Anesthesia Assessment:                           - Prior to the procedure, a History and Physical                            was performed, and patient medications and                            allergies were reviewed. The patient's tolerance of                            previous anesthesia was also reviewed. The risks                            and benefits of the procedure and the sedation                            options and risks were discussed with the patient.                            All questions were answered, and informed consent                            was obtained. Prior Anticoagulants: The patient has                            taken Plavix (clopidogrel), last dose was 3 days                            prior to procedure. ASA Grade Assessment: III - A  patient with severe systemic disease. After                            reviewing the risks and benefits, the patient was                            deemed in satisfactory condition to undergo the                            procedure.                           After obtaining informed consent, the scope was                            passed under direct vision. Throughout the               procedure, the patient's blood pressure, pulse, and                            oxygen saturations were monitored continuously. The                            [id number] scope was introduced through the                            mouth, and used to inject contrast into and used to                            locate the major papilla. The ERCP was incomplete                            for the reasons explained below. The patient                            tolerated the procedure well. 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 The major       papilla was bulging. Impression:               - The major papilla was located entirely within a                            medium sized diverticulum (see images). I was able                            to locate what I felt was the ampullary structure                            (see images) but I was never able to stably orient                            this en face with the duodenoscope for a realistic  chance at cannulation. The ampullary struture                            seemed slightly firm but not overtly neoplastic                            appearing. I attempted wire cannulation, rather                            blindly while dragging the ampullary structure into                            en face position with the cannula. I was never able                            to cannulate the bile duct however and after 30-45                            minute trial I aborted the procedure. Recommendation:           - Return patient to hospital ward for ongoing care.                           - Should continue with plans for IR biloma drain.                           - Will need to consider transfer to tertiary center                            for another attempt at ERCP. Procedure Code(s):        --- Professional ---                           [phone],  Esophagogastroduodenoscopy, flexible,                            transoral; diagnostic, including collection of                            specimen(s) by brushing or washing, when performed                            (separate procedure) Diagnosis Code(s):        --- Professional ---                           K83.8, Other specified diseases of biliary tract CPT copyright 2017 American Medical Association. All rights reserved. The codes documented in this report are preliminary and upon coder review may  be revised to meet current compliance requirements. Milus Banister, MD 10/09/2017 2:37:49 PM This report has been signed electronically. Number of Addenda: 1 Addendum Number: 1   Addendum Date: 10/21/2017 7:35:00 PM      Text should read "the procedure was incomplete for the reasons explained       in the body of  the report." Milus Banister, MD 10/21/2017 7:36:30 PM This report has been signed electronically.

## 2017-10-09 NOTE — Anesthesia Preprocedure Evaluation (Addendum)
Anesthesia Evaluation  Patient identified by MRN, date of birth, ID band Patient awake    Reviewed: Allergy & Precautions, NPO status , Patient's Chart, lab work & pertinent test results  History of Anesthesia Complications Negative for: history of anesthetic complications  Airway Mallampati: II  TM Distance: >3 FB Neck ROM: Full    Dental  (+) Teeth Intact, Dental Advisory Given, Poor Dentition   Pulmonary neg pulmonary ROS,    breath sounds clear to auscultation       Cardiovascular hypertension, Pt. on medications + CAD, + Cardiac Stents and + Peripheral Vascular Disease (AAA: Infrarenal 4.7 cm abdominal aortic aneurysm,)  + dysrhythmias + Valvular Problems/Murmurs  Rhythm:Regular Rate:Normal  09/24/17 ECHO: EF 55-60%, mild AS   Neuro/Psych negative neurological ROS     GI/Hepatic negative GI ROS, Acute cholecystitis   Endo/Other  negative endocrine ROS  Renal/GU Renal InsufficiencyRenal disease (creat 2.17)     Musculoskeletal  (+) Arthritis , Osteoarthritis,    Abdominal   Peds  Hematology  (+) Blood dyscrasia (Hb 9.7), anemia ,   Anesthesia Other Findings   Reproductive/Obstetrics                             Anesthesia Physical  Anesthesia Plan  ASA: III  Anesthesia Plan: General   Post-op Pain Management:    Induction: Intravenous  PONV Risk Score and Plan: 3 and Ondansetron, Dexamethasone and Treatment may vary due to age or medical condition  Airway Management Planned: Oral ETT  Additional Equipment:   Intra-op Plan:   Post-operative Plan: Extubation in OR  Informed Consent: I have reviewed the patients History and Physical, chart, labs and discussed the procedure including the risks, benefits and alternatives for the proposed anesthesia with the patient or authorized representative who has indicated his/her understanding and acceptance.   Dental advisory  given  Plan Discussed with: CRNA  Anesthesia Plan Comments: (P)       Anesthesia Quick Evaluation

## 2017-10-09 NOTE — Progress Notes (Signed)
Called to bring pt down for drain placement. Informed by pts nurse Verdis Frederickson, pt scheduled to go to Endo at 12p today. Will call back later if able to bring pt to IR.

## 2017-10-09 NOTE — Transfer of Care (Signed)
Immediate Anesthesia Transfer of Care Note  Patient: Terry Garner  Procedure(s) Performed: ENDOSCOPIC RETROGRADE CHOLANGIOPANCREATOGRAPHY (ERCP) (N/A )  Patient Location: Endoscopy Unit  Anesthesia Type:General  Level of Consciousness: awake, patient cooperative and responds to stimulation  Airway & Oxygen Therapy: Patient Spontanous Breathing and Patient connected to nasal cannula oxygen  Post-op Assessment: Report given to RN and Post -op Vital signs reviewed and stable  Post vital signs: Reviewed and stable  Last Vitals:  Vitals Value Taken Time  BP 144/43 10/09/2017  2:44 PM  Temp 36.8 C 10/09/2017  2:44 PM  Pulse 57 10/09/2017  2:46 PM  Resp 16 10/09/2017  2:46 PM  SpO2 95 % 10/09/2017  2:46 PM  Vitals shown include unvalidated device data.  Last Pain:  Vitals:   10/09/17 1444  TempSrc: Oral  PainSc: 0-No pain         Complications: No apparent anesthesia complications

## 2017-10-09 NOTE — Progress Notes (Signed)
Patient ID: Terry Garner, male   DOB: [birth year], 77 y.o.   MRN: [id number]       Subjective: Patient says he doesn't know how he feels today.  He starts talking about the doctor and the mayor messing around with something.  The wife states that he has been talking a little off this morning.  He is alert and oriented x4 when questioned though.  Objective: Vital signs in last 24 hours: Temp:  [97.7 F (36.5 C)-98.7 F (37.1 C)] 97.7 F (36.5 C) (04/17 0414) Pulse Rate:  [58-68] 61 (04/17 0414) Resp:  [16-18] 18 (04/17 0414) BP: (127-153)/(49-88) 153/49 (04/17 0414) SpO2:  [93 %-95 %] 95 % (04/17 0414) Last BM Date: 10/06/17  Intake/Output from previous day: 04/16 0701 - 04/17 0700 In: 825 [I.V.:725; IV Piggyback:100] Out: 480 [Urine:480] Intake/Output this shift: Total I/O In: -  Out: 300 [Urine:300]  PE: Heart: regular Lungs: CTAB Abd: soft, mildly tender in RUQ, drain with minimal bilious output.  Lab Results:  Recent Labs    10/07/17 1053 10/08/17 0325  WBC 18.9* 13.1*  HGB 8.4* 8.0*  HCT 25.4* 24.6*  PLT 259 285   BMET Recent Labs    10/07/17 1052 10/08/17 0325  NA 139 142  K 3.7 4.0  CL 109 112*  CO2 21* 22  GLUCOSE 103* 107*  BUN 38* 36*  CREATININE 2.28* 2.05*  CALCIUM 8.1* 8.1*   PT/INR Recent Labs    10/09/17 0616  LABPROT 15.8*  INR 1.27   CMP     Component Value Date/Time   NA 142 10/08/2017 0325   K 4.0 10/08/2017 0325   CL 112 (H) 10/08/2017 0325   CO2 22 10/08/2017 0325   GLUCOSE 107 (H) 10/08/2017 0325   BUN 36 (H) 10/08/2017 0325   CREATININE 2.05 (H) 10/08/2017 0325   CREATININE 2.80 (H) 07/15/2017 1405   CALCIUM 8.1 (L) 10/08/2017 0325   PROT 4.1 (L) 10/08/2017 0325   ALBUMIN 1.4 (L) 10/08/2017 0325   AST 30 10/08/2017 0325   ALT 20 10/08/2017 0325   ALKPHOS 130 (H) 10/08/2017 0325   BILITOT 1.8 (H) 10/08/2017 0325   GFRNONAA 29 (L) 10/08/2017 0325   GFRNONAA 21 (L) 07/15/2017 1405   GFRAA 34 (L) 10/08/2017 0325   GFRAA 24 (L) 07/15/2017 1405   Lipase     Component Value Date/Time   LIPASE 17 09/24/2017 0343       Studies/Results: Ct Abdomen Wo Contrast  Result Date: 10/08/2017 CLINICAL DATA:  Biloma.  Acute generalized abdominal pain. EXAM: CT ABDOMEN WITHOUT CONTRAST TECHNIQUE: Multidetector CT imaging of the abdomen was performed following the standard protocol without IV contrast. COMPARISON:  CT scan of September 23, 2017. FINDINGS: Lower chest: Bibasilar subsegmental atelectasis or infiltrates are noted with associated pleural effusions. Hepatobiliary: Status post cholecystectomy. Large fluid collection is seen in gallbladder fossa which measures 7.6 x 6.3 cm. This fluid collection is seen to track along the lateral margin of the right hepatic lobe and appears to be subcapsular. Surgical drain is seen passing through this fluid collection in the gallbladder fossa, although the tip appears to be outside of the fluid collection. Pancreas: Unremarkable. No pancreatic ductal dilatation or surrounding inflammatory changes. Spleen: Normal in size without focal abnormality. Mild amount of perisplenic fluid is noted. Adrenals/Urinary Tract: Adrenal glands appear normal. Bilateral nonobstructive nephrolithiasis is noted. No hydronephrosis or renal obstruction is noted. Stomach/Bowel: The stomach appears normal. There is no evidence of bowel obstruction or inflammation. Vascular/Lymphatic:  3.8 cm infrarenal abdominal aortic aneurysm is noted. Atherosclerosis of thoracic aorta is noted. No significant adenopathy is noted. Other: No abdominal wall hernia or abnormality. Musculoskeletal: No acute or significant osseous findings. IMPRESSION: Status post cholecystectomy. Large fluid collection is seen in gallbladder fossa which extends superiorly along lateral margin right hepatic lobe, and appears to be subcapsular in this area. This is most consistent with biloma. Surgical drain is seen passing through this fluid  collection, although its distal tip is external to the fluid collection. Mild amount of perisplenic fluid is noted. 3.8 cm infrarenal abdominal aortic aneurysm is noted. Recommend followup by [place] in 2 years. This recommendation follows ACR consensus guidelines: White Paper of the ACR Incidental Findings Committee II on Vascular Findings. J Am Coll Radiol 2013; 10:789-794. Electronically Signed   By: Marijo Conception, M.D.   On: 10/08/2017 13:16   Nm Hepatobiliary Liver Func  Result Date: 10/07/2017 CLINICAL DATA:  Recent cholecystectomy with concern for bile leak EXAM: NUCLEAR MEDICINE HEPATOBILIARY IMAGING TECHNIQUE: Sequential images of the abdomen in anterior projection were obtained out to 60 minutes following intravenous administration of radiopharmaceutical. RADIOPHARMACEUTICALS:  5.1 mCi Tc-7m  Choletec IV COMPARISON:  None. FINDINGS: Liver uptake of radiotracer is normal. There is prompt visualization of small bowel indicating patency of the common bile duct. Gallbladder is noted to be absent. There is radiotracer extending to the right and superiorly over the liver. It is felt that this ectopic radiotracer is evidence of a bile leak at the level of the cystic duct. IMPRESSION: Ectopic radiotracer seen in the right upper quadrant felt to represent bile leak. Gallbladder known to be absent. Prompt visualization of small bowel indicates patency of the common bile duct. These results will be called to the ordering clinician or representative by the Radiologist Assistant, and communication documented in the PACS or zVision Dashboard. Electronically Signed   By: Lowella Grip III M.D.   On: 10/07/2017 16:09   US Abdomen Limited Ruq  Result Date: 10/07/2017 CLINICAL DATA:  77 year old male post cholecystectomy with findings on recent hepatobiliary scan raising possibility of bile leak. Subsequent encounter. EXAM: ULTRASOUND ABDOMEN LIMITED RIGHT UPPER QUADRANT COMPARISON:  10/07/2017 hepatic biliary  scan. 09/25/2007 preoperative MR. FINDINGS: Gallbladder: Surgically absent. Common bile duct: Diameter: 6 mm. Liver: Complex abnormal fluid collection surrounds the liver and within the gallbladder fossa. This is worrisome for postoperative bile leak which may be coming loculated/infected. CT recommended for further delineation. Right-sided pleural effusion and right lower lobe consolidation. This may reflect reactive changes or extension of postoperative infectious process/biloma. Portal vein is patent on color Doppler imaging with normal direction of blood flow towards the liver. IMPRESSION: Complex abnormal fluid collection surrounds the liver and within the gallbladder fossa. This is worrisome for postoperative bile leak which may be loculated/infected. CT recommended for further delineation. Right-sided pleural effusion and right lower lobe consolidation. This may reflect reactive changes or extension of postoperative infectious process/biloma. These results will be called to the ordering clinician or representative by the Radiologist Assistant, and communication documented in the PACS or zVision Dashboard. Electronically Signed   By: Genia Del M.D.   On: 10/07/2017 19:07    Anti-infectives: Anti-infectives (From admission, onward)   Start     Dose/Rate Route Frequency Ordered Stop   10/08/17 1600  piperacillin-tazobactam (ZOSYN) IVPB 3.375 g     3.375 g 12.5 mL/hr over 240 Minutes Intravenous Every 8 hours 10/08/17 0837     10/08/17 0900  piperacillin-tazobactam (ZOSYN) IVPB  3.375 g     3.375 g 100 mL/hr over 30 Minutes Intravenous  Once 10/08/17 0851 10/08/17 1100       Assessment/Plan Bile leak, POD 13, s/p lap chole -HIDA was positive.  GI on board for ERCP today with Dr. Ardis Hughs.  Patient's last dose of plavix was on Sunday and is currently being held. -appreciate GI evaluation -US reveals biloma that is undrained.  Minimal output from JP drain.  IR consult to evaluate for drain of  undrained biloma. This may be placed today as well pending scheduling with ERCP -placed on zosyn due to biloma and elevated WBC, CBC pending today  CKD - cr 2.08, BMET in am.  This appears to be around his baseline HTN/CAD - on home antihypertensives.  Resume lasix to help kidneys but avoid fluid overload   FEN - NPO for procedures VTE - SCDs/chemical prophylaxis can be started after all of his procedures ID - Zosyn, labs in am, CBC pending today   LOS: 2 days    Henreitta Cea , [facility] 10/09/2017, 9:02 AM Pager: [phone]

## 2017-10-10 ENCOUNTER — Inpatient Hospital Stay (HOSPITAL_COMMUNITY): Payer: Medicare Other

## 2017-10-10 LAB — COMPREHENSIVE METABOLIC PANEL
ALBUMIN: 1.4 g/dL — AB (ref 3.5–5.0)
ALK PHOS: 134 U/L — AB (ref 38–126)
ALT: 25 U/L (ref 17–63)
AST: 35 U/L (ref 15–41)
Anion gap: 9 (ref 5–15)
BUN: 37 mg/dL — ABNORMAL HIGH (ref 6–20)
CALCIUM: 7.7 mg/dL — AB (ref 8.9–10.3)
CHLORIDE: 112 mmol/L — AB (ref 101–111)
CO2: 20 mmol/L — AB (ref 22–32)
Creatinine, Ser: 2.19 mg/dL — ABNORMAL HIGH (ref 0.61–1.24)
GFR calc non Af Amer: 27 mL/min — ABNORMAL LOW (ref >60)
GFR, EST AFRICAN AMERICAN: 31 mL/min — AB (ref >60)
GLUCOSE: 158 mg/dL — AB (ref 65–99)
Potassium: 4.1 mmol/L (ref 3.5–5.1)
SODIUM: 141 mmol/L (ref 135–145)
Total Bilirubin: 1.6 mg/dL — ABNORMAL HIGH (ref 0.3–1.2)
Total Protein: 4.2 g/dL — ABNORMAL LOW (ref 6.5–8.1)

## 2017-10-10 LAB — CBC
HCT: 27.6 % — ABNORMAL LOW (ref 39.0–52.0)
HEMOGLOBIN: 8.8 g/dL — AB (ref 13.0–17.0)
MCH: 28.4 pg (ref 26.0–34.0)
MCHC: 31.9 g/dL (ref 30.0–36.0)
MCV: 89 fL (ref 78.0–100.0)
Platelets: 247 10*3/uL (ref 150–400)
RBC: 3.1 MIL/uL — AB (ref 4.22–5.81)
RDW: 15.5 % (ref 11.5–15.5)
WBC: 8.4 10*3/uL (ref 4.0–10.5)

## 2017-10-10 MED ORDER — FENTANYL CITRATE (PF) 100 MCG/2ML IJ SOLN
INTRAMUSCULAR | Status: AC | PRN
  Administered 2017-10-10 (×2): 25 ug via INTRAVENOUS

## 2017-10-10 MED ORDER — ENOXAPARIN SODIUM 40 MG/0.4ML ~~LOC~~ SOLN
40.0000 mg | SUBCUTANEOUS | Status: DC
  Administered 2017-10-10 – 2017-10-17 (×8): 40 mg via SUBCUTANEOUS
  Filled 2017-10-10 (×8): qty 0.4

## 2017-10-10 MED ORDER — HALOPERIDOL LACTATE 5 MG/ML IJ SOLN: 5.0000 mg | Freq: Four times a day (QID) | INTRAMUSCULAR | Status: DC | PRN

## 2017-10-10 MED ORDER — PIPERACILLIN-TAZOBACTAM 3.375 G IVPB
3.3750 g | Freq: Three times a day (TID) | INTRAVENOUS | Status: DC
  Administered 2017-10-11 – 2017-10-16 (×15): 3.375 g via INTRAVENOUS
  Filled 2017-10-10 (×18): qty 50

## 2017-10-10 MED ORDER — MIDAZOLAM HCL 2 MG/2ML IJ SOLN
INTRAMUSCULAR | Status: AC
  Filled 2017-10-10: qty 2

## 2017-10-10 MED ORDER — FENTANYL CITRATE (PF) 100 MCG/2ML IJ SOLN
INTRAMUSCULAR | Status: AC
  Filled 2017-10-10: qty 2

## 2017-10-10 MED ORDER — LIDOCAINE HCL 1 % IJ SOLN
INTRAMUSCULAR | Status: AC
  Filled 2017-10-10: qty 20

## 2017-10-10 MED ORDER — MIDAZOLAM HCL 2 MG/2ML IJ SOLN
INTRAMUSCULAR | Status: AC | PRN
  Administered 2017-10-10: 1 mg via INTRAVENOUS

## 2017-10-10 NOTE — Progress Notes (Signed)
Patient ID: Terry Garner, male   DOB: 06/26/1940, 77 y.o.   MRN: [id number]    1 Day Post-Op  Subjective: Pt has no complaints.  Just got back from drain placement.  Some confusion in normal conversation, but otherwise oriented.  Denies much abdominal pain at all.  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  Awaiting bed at [facility] for tx.  Objective: Vital signs in last 24 hours: Temp:  [97.3 F (36.3 C)-98.4 F (36.9 C)] 97.3 F (36.3 C) (04/18 0416) Pulse Rate:  [50-61] 56 (04/18 0940) Resp:  [10-23] 10 (04/18 0940) BP: (139-180)/(43-62) 152/59 (04/18 0940) SpO2:  [63 %-100 %] 95 % (04/18 0940) Weight:  [81.6 kg (180 lb)] 81.6 kg (180 lb) (04/17 1245) Last BM Date: 10/09/17  Intake/Output from previous day: 04/17 0701 - 04/18 0700 In: 612.5 [I.V.:512.5; IV Piggyback:100] Out: 710 [Urine:700; Blood:10] Intake/Output this shift: Total I/O In: -  Out: 475 [Drains:475]  PE: Heart: brady, but regular Lungs: CTAB Abd: soft, minimally tender, incisions are healing well. +BS, drain just emptied after being full with bilious output.  Lab Results:  Recent Labs    10/09/17 1018 10/10/17 0401  WBC 9.1 8.4  HGB 8.2* 8.8*  HCT 24.5* 27.6*  PLT 242 247   BMET Recent Labs    10/08/17 0325 10/10/17 0401  NA 142 141  K 4.0 4.1  CL 112* 112*  CO2 22 20*  GLUCOSE 107* 158*  BUN 36* 37*  CREATININE 2.05* 2.19*  CALCIUM 8.1* 7.7*   PT/INR Recent Labs    10/09/17 0616  LABPROT 15.8*  INR 1.27   CMP     Component Value Date/Time   NA 141 10/10/2017 0401   K 4.1 10/10/2017 0401   CL 112 (H) 10/10/2017 0401   CO2 20 (L) 10/10/2017 0401   GLUCOSE 158 (H) 10/10/2017 0401   BUN 37 (H) 10/10/2017 0401   CREATININE 2.19 (H) 10/10/2017 0401   CREATININE 2.80 (H) 07/15/2017 1405   CALCIUM 7.7 (L) 10/10/2017 0401   PROT 4.2 (L) 10/10/2017 0401   ALBUMIN 1.4 (L) 10/10/2017 0401   AST 35 10/10/2017 0401   ALT 25 10/10/2017 0401   ALKPHOS 134 (H)  10/10/2017 0401   BILITOT 1.6 (H) 10/10/2017 0401   GFRNONAA 27 (L) 10/10/2017 0401   GFRNONAA 21 (L) 07/15/2017 1405   GFRAA 31 (L) 10/10/2017 0401   GFRAA 24 (L) 07/15/2017 1405   Lipase     Component Value Date/Time   LIPASE 17 09/24/2017 0343       Studies/Results: Ct Abdomen Wo Contrast  Result Date: 10/08/2017 CLINICAL DATA:  Biloma.  Acute generalized abdominal pain. EXAM: CT ABDOMEN WITHOUT CONTRAST TECHNIQUE: Multidetector CT imaging of the abdomen was performed following the standard protocol without IV contrast. COMPARISON:  CT scan of September 23, 2017. FINDINGS: Lower chest: Bibasilar subsegmental atelectasis or infiltrates are noted with associated pleural effusions. Hepatobiliary: Status post cholecystectomy. Large fluid collection is seen in gallbladder fossa which measures 7.6 x 6.3 cm. This fluid collection is seen to track along the lateral margin of the right hepatic lobe and appears to be subcapsular. Surgical drain is seen passing through this fluid collection in the gallbladder fossa, although the tip appears to be outside of the fluid collection. Pancreas: Unremarkable. No pancreatic ductal dilatation or surrounding inflammatory changes. Spleen: Normal in size without focal abnormality. Mild amount of perisplenic fluid is noted. Adrenals/Urinary Tract: Adrenal glands appear normal. Bilateral nonobstructive nephrolithiasis is  noted. No hydronephrosis or renal obstruction is noted. Stomach/Bowel: The stomach appears normal. There is no evidence of bowel obstruction or inflammation. Vascular/Lymphatic: 3.8 cm infrarenal abdominal aortic aneurysm is noted. Atherosclerosis of thoracic aorta is noted. No significant adenopathy is noted. Other: No abdominal wall hernia or abnormality. Musculoskeletal: No acute or significant osseous findings. IMPRESSION: Status post cholecystectomy. Large fluid collection is seen in gallbladder fossa which extends superiorly along lateral margin right  hepatic lobe, and appears to be subcapsular in this area. This is most consistent with biloma. Surgical drain is seen passing through this fluid collection, although its distal tip is external to the fluid collection. Mild amount of perisplenic fluid is noted. 3.8 cm infrarenal abdominal aortic aneurysm is noted. Recommend followup by [place] in 2 years. This recommendation follows ACR consensus guidelines: White Paper of the ACR Incidental Findings Committee II on Vascular Findings. J Am Coll Radiol 2013; 10:789-794. Electronically Signed   By: Marijo Conception, M.D.   On: 10/08/2017 13:16   Dg C-arm 1-60 Min-no Report  Result Date: 10/09/2017 Fluoroscopy was utilized by the requesting physician.  No radiographic interpretation.    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  Awaiting tx to [facility] for ERCP and stent placement. -just had biloma drain placed and draining well. -placed on zosyn due to biloma and elevated WBC on admission.  WBC has normalized.  CKD - cr slight up to 2.19, still around baseline, BMET in am. This appears to be around his baseline, but I have resumed his home lasix and he is negative about 0.5-1L currently.  May need to push oral intake or bump fluids slightly if creatinine trends up and I&Os remain negative. HTN/CAD - on home antihypertensives. Resume lasix to help kidneys but avoid fluid overload   FEN -heart healthy diet VTE -SCDs/Lovenox tonight at 2200, cont to hold plavix for now ID -Zosyn, ?  Duration  dispo - awaiting tx to [name]     LOS: 3 days    Henreitta Cea , [facility] 10/10/2017, 11:31 AM Pager: [phone]

## 2017-10-10 NOTE — Progress Notes (Addendum)
Progress Note   Subjective  Patient states he feels okay. S/p ERCP attempt yesterday which was not successful. Minimal output out of biliary drain.    Objective   Vital signs in last 24 hours: Temp:  [97.3 F (36.3 C)-98.4 F (36.9 C)] 97.3 F (36.3 C) (04/18 0416) Pulse Rate:  [50-61] 50 (04/18 0416) Resp:  [17-23] 18 (04/18 0416) BP: (139-180)/(43-62) 139/59 (04/18 0416) SpO2:  [63 %-97 %] 97 % (04/18 0416) Weight:  [180 lb (81.6 kg)] 180 lb (81.6 kg) (04/17 1245) Last BM Date: 10/09/17 General:    white male in NAD Heart:  Regular rate and rhythm; no murmurs Lungs: Respirations even and unlabored, lungs CTA bilaterally Abdomen:  Soft, nontender, biliary drain in RUQ with minimal output.  Extremities:  Without edema. Neurologic:  Alert and oriented,  grossly normal neurologically. Psych:  Cooperative. Normal mood and affect.  Intake/Output from previous day: 04/17 0701 - 04/18 0700 In: 612.5 [I.V.:512.5; IV Piggyback:100] Out: 710 [Urine:700; Blood:10] Intake/Output this shift: No intake/output data recorded.  Lab Results: Recent Labs    10/08/17 0325 10/09/17 1018 10/10/17 0401  WBC 13.1* 9.1 8.4  HGB 8.0* 8.2* 8.8*  HCT 24.6* 24.5* 27.6*  PLT 285 242 247   BMET Recent Labs    10/07/17 1052 10/08/17 0325 10/10/17 0401  NA 139 142 141  K 3.7 4.0 4.1  CL 109 112* 112*  CO2 21* 22 20*  GLUCOSE 103* 107* 158*  BUN 38* 36* 37*  CREATININE 2.28* 2.05* 2.19*  CALCIUM 8.1* 8.1* 7.7*   LFT Recent Labs    10/10/17 0401  PROT 4.2*  ALBUMIN 1.4*  AST 35  ALT 25  ALKPHOS 134*  BILITOT 1.6*   PT/INR Recent Labs    10/09/17 0616  LABPROT 15.8*  INR 1.27    Studies/Results: Ct Abdomen Wo Contrast  Result Date: 10/08/2017 CLINICAL DATA:  Biloma.  Acute generalized abdominal pain. EXAM: CT ABDOMEN WITHOUT CONTRAST TECHNIQUE: Multidetector CT imaging of the abdomen was performed following the standard protocol without IV contrast. COMPARISON:   CT scan of September 23, 2017. FINDINGS: Lower chest: Bibasilar subsegmental atelectasis or infiltrates are noted with associated pleural effusions. Hepatobiliary: Status post cholecystectomy. Large fluid collection is seen in gallbladder fossa which measures 7.6 x 6.3 cm. This fluid collection is seen to track along the lateral margin of the right hepatic lobe and appears to be subcapsular. Surgical drain is seen passing through this fluid collection in the gallbladder fossa, although the tip appears to be outside of the fluid collection. Pancreas: Unremarkable. No pancreatic ductal dilatation or surrounding inflammatory changes. Spleen: Normal in size without focal abnormality. Mild amount of perisplenic fluid is noted. Adrenals/Urinary Tract: Adrenal glands appear normal. Bilateral nonobstructive nephrolithiasis is noted. No hydronephrosis or renal obstruction is noted. Stomach/Bowel: The stomach appears normal. There is no evidence of bowel obstruction or inflammation. Vascular/Lymphatic: 3.8 cm infrarenal abdominal aortic aneurysm is noted. Atherosclerosis of thoracic aorta is noted. No significant adenopathy is noted. Other: No abdominal wall hernia or abnormality. Musculoskeletal: No acute or significant osseous findings. IMPRESSION: Status post cholecystectomy. Large fluid collection is seen in gallbladder fossa which extends superiorly along lateral margin right hepatic lobe, and appears to be subcapsular in this area. This is most consistent with biloma. Surgical drain is seen passing through this fluid collection, although its distal tip is external to the fluid collection. Mild amount of perisplenic fluid is noted. 3.8 cm infrarenal abdominal aortic aneurysm is noted.  Recommend followup by [place] in 2 years. This recommendation follows ACR consensus guidelines: White Paper of the ACR Incidental Findings Committee II on Vascular Findings. J Am Coll Radiol 2013; 10:789-794. Electronically Signed   By: Marijo Conception, M.D.   On: 10/08/2017 13:16   Dg C-arm 1-60 Min-no Report  Result Date: 10/09/2017 Fluoroscopy was utilized by the requesting physician.  No radiographic interpretation.       Assessment / Plan:    77 y/o male with history of CAD on plavix, s/p cholecystectomy on 09/26/2017, presented with a bile leak confirmed on HIDA scan. [place] and CT shows biloma in the RUQ, CT showing the tip of the drain is outside of the biloma. ERCP attempted yesterday per Dr. Ardis Hughs. Unfortunately there is a large diverticulum at the ampulla which made the exam technically quite challenging and the bile duct was not able to be cannulated.  At this point, agree with IR to replace drain within the biloma. I otherwise discussed options with the patient and wife regarding how to fix the bile leak. We are recommending a transfer to a tertiary care facility for another ERCP attempt with stent placement. After discussion of options, they prefer to be transferred to [facility] where he has received some of his care in the past. I contact them and see if they can accommodate his case, will provide update when I hear back from them.   Call with questions otherwise.  Teachey Cellar, MD [facility]   UPDATE: Patient has been accepted by [facility] for transfer and ERCP but unclear of timing. They may do the procedure and send him back to recover her, or keep him. Pending bed availability, it's possible this may not be done until the weekend or Monday. I will keep the family posted.

## 2017-10-10 NOTE — Sedation Documentation (Signed)
Patient is resting comfortably. 

## 2017-10-10 NOTE — Progress Notes (Signed)
Patient's bed alarm went off and charge RN and additional RNs and nurse techs went to room and patient was walking towards the door throwing urine and blood at staff. Patient has had new onset confusion today prior to surgery per dayshift RN Lowella Dandy. [facility] paged and notified of situation. Patient's VS have been stable throughout the day. Jefm Petty (on-call with [facility]) gave verbal order for Q8 hour PRN for agitation. Patient now in bed and speaking with his wife. Patient has calmed down and now lying in bed. Will continue to monitor and treat per MD orders.

## 2017-10-11 LAB — BASIC METABOLIC PANEL
ANION GAP: 9 (ref 5–15)
BUN: 38 mg/dL — ABNORMAL HIGH (ref 6–20)
CHLORIDE: 110 mmol/L (ref 101–111)
CO2: 21 mmol/L — AB (ref 22–32)
Calcium: 7.7 mg/dL — ABNORMAL LOW (ref 8.9–10.3)
Creatinine, Ser: 2.17 mg/dL — ABNORMAL HIGH (ref 0.61–1.24)
GFR calc non Af Amer: 27 mL/min — ABNORMAL LOW (ref >60)
GFR, EST AFRICAN AMERICAN: 32 mL/min — AB (ref >60)
Glucose, Bld: 104 mg/dL — ABNORMAL HIGH (ref 65–99)
POTASSIUM: 3.5 mmol/L (ref 3.5–5.1)
SODIUM: 140 mmol/L (ref 135–145)

## 2017-10-11 LAB — CBC
HCT: 26.3 % — ABNORMAL LOW (ref 39.0–52.0)
HEMOGLOBIN: 8.4 g/dL — AB (ref 13.0–17.0)
MCH: 28.8 pg (ref 26.0–34.0)
MCHC: 31.9 g/dL (ref 30.0–36.0)
MCV: 90.1 fL (ref 78.0–100.0)
Platelets: 237 10*3/uL (ref 150–400)
RBC: 2.92 MIL/uL — AB (ref 4.22–5.81)
RDW: 15.7 % — ABNORMAL HIGH (ref 11.5–15.5)
WBC: 7.2 10*3/uL (ref 4.0–10.5)

## 2017-10-11 NOTE — Progress Notes (Signed)
Referring Physician(s): CCS; Dr Sherrill Raring  Supervising Physician: Sandi Mariscal  Patient Status:  [facility] - In-pt  Chief Complaint:  Biloma drain placed 4/18   Subjective:  Wbc wnl afeb OP 500-800 cc daily Bilious OP  Resting in bed; wife at bedside To [name] for ERCP?--- plans still being made  Allergies: Toprol xl [metoprolol tartrate]; Lipitor [atorvastatin]; Coumadin [warfarin sodium]; Tape; and Warfarin  Medications: Prior to Admission medications   Medication Sig Start Date End Date Taking? Authorizing Provider  allopurinol (ZYLOPRIM) 300 MG tablet Take 300 mg by mouth daily.   Yes [provider]  amLODipine (NORVASC) 10 MG tablet Take 10 mg by mouth daily. 10/02/17  Yes [provider]  aspirin EC 81 MG tablet Take 81 mg by mouth daily.   Yes [provider]  brimonidine (ALPHAGAN) 0.15 % ophthalmic solution Place 1 drop into both eyes 2 (two) times daily.   Yes [provider]  Cholecalciferol (VITAMIN D3) 5000 UNITS TABS Take 5,000 Units by mouth daily.   Yes [provider]  clopidogrel (PLAVIX) 75 MG tablet TAKE 1 TABLET BY MOUTH ONCE DAILY FOR HEART STENTS Patient taking differently: TAKE 1 TABLET (75mg ) BY MOUTH ONCE DAILY FOR HEART STENTS 06/27/17  Yes [name], MD  dorzolamide (TRUSOPT) 2 % ophthalmic solution Place 1 drop into the right eye 3 (three) times daily.   Yes [provider]  fenofibrate micronized (LOFIBRA) 134 MG capsule TAKE 1 CAPSULE BY MOUTH ONCE DAILY 10/02/17  Yes [name], MD  furosemide (LASIX) 40 MG tablet Take 40-80 mg by mouth daily. 80mg  in the morning and 40mg  in the evening 08/29/17  Yes [provider]  loteprednol (LOTEMAX) 0.5 % ophthalmic suspension Place 1 drop into the right eye 2 (two) times daily.    Yes [provider]  nepafenac (ILEVRO) 0.3 % ophthalmic suspension Place 1 drop into the right eye daily.   Yes [provider]    pravastatin (PRAVACHOL) 40 MG tablet TAKE ONE TABLET BY MOUTH AT BEDTIME FOR  CHOLESTEROL Patient taking differently: TAKE ONE TABLET (40mg ) BY MOUTH AT BEDTIME FOR  CHOLESTEROL 03/25/17  Yes [name], MD  timolol (BETIMOL) 0.5 % ophthalmic solution Place 1 drop into both eyes every morning.   Yes [provider]  traMADol (ULTRAM) 50 MG tablet Take 1 tablet (50 mg total) by mouth every 12 (twelve) hours as needed for severe pain. 10/01/17  Yes Jani Gravel, MD     Vital Signs: BP (!) 143/56 (BP Location: Right Arm)   Pulse (!) 46   Temp 97.6 F (36.4 C)   Resp 18   Ht 6\' 2"  (1.88 m)   Wt 180 lb (81.6 kg)   SpO2 94%   BMI 23.11 kg/m   Physical Exam  Abdominal: Soft.  Skin: Skin is warm and dry.  Site of biloma drain is dry/ NT OP bilious 500-800 cc daily Resting well   Nursing note and vitals reviewed.   Imaging: Ct Abdomen Wo Contrast  Result Date: 10/08/2017 CLINICAL DATA:  Biloma.  Acute generalized abdominal pain. EXAM: CT ABDOMEN WITHOUT CONTRAST TECHNIQUE: Multidetector CT imaging of the abdomen was performed following the standard protocol without IV contrast. COMPARISON:  CT scan of September 23, 2017. FINDINGS: Lower chest: Bibasilar subsegmental atelectasis or infiltrates are noted with associated pleural effusions. Hepatobiliary: Status post cholecystectomy. Large fluid collection is seen in gallbladder fossa which measures 7.6 x 6.3 cm. This fluid collection is seen to track  along the lateral margin of the right hepatic lobe and appears to be subcapsular. Surgical drain is seen passing through this fluid collection in the gallbladder fossa, although the tip appears to be outside of the fluid collection. Pancreas: Unremarkable. No pancreatic ductal dilatation or surrounding inflammatory changes. Spleen: Normal in size without focal abnormality. Mild amount of perisplenic fluid is noted. Adrenals/Urinary Tract: Adrenal glands appear normal. Bilateral nonobstructive  nephrolithiasis is noted. No hydronephrosis or renal obstruction is noted. Stomach/Bowel: The stomach appears normal. There is no evidence of bowel obstruction or inflammation. Vascular/Lymphatic: 3.8 cm infrarenal abdominal aortic aneurysm is noted. Atherosclerosis of thoracic aorta is noted. No significant adenopathy is noted. Other: No abdominal wall hernia or abnormality. Musculoskeletal: No acute or significant osseous findings. IMPRESSION: Status post cholecystectomy. Large fluid collection is seen in gallbladder fossa which extends superiorly along lateral margin right hepatic lobe, and appears to be subcapsular in this area. This is most consistent with biloma. Surgical drain is seen passing through this fluid collection, although its distal tip is external to the fluid collection. Mild amount of perisplenic fluid is noted. 3.8 cm infrarenal abdominal aortic aneurysm is noted. Recommend followup by [place] in 2 years. This recommendation follows ACR consensus guidelines: White Paper of the ACR Incidental Findings Committee II on Vascular Findings. J Am Coll Radiol 2013; 10:789-794. Electronically Signed   By: Marijo Conception, M.D.   On: 10/08/2017 13:16   Nm Hepatobiliary Liver Func  Result Date: 10/07/2017 CLINICAL DATA:  Recent cholecystectomy with concern for bile leak EXAM: NUCLEAR MEDICINE HEPATOBILIARY IMAGING TECHNIQUE: Sequential images of the abdomen in anterior projection were obtained out to 60 minutes following intravenous administration of radiopharmaceutical. RADIOPHARMACEUTICALS:  5.1 mCi Tc-29m  Choletec IV COMPARISON:  None. FINDINGS: Liver uptake of radiotracer is normal. There is prompt visualization of small bowel indicating patency of the common bile duct. Gallbladder is noted to be absent. There is radiotracer extending to the right and superiorly over the liver. It is felt that this ectopic radiotracer is evidence of a bile leak at the level of the cystic duct. IMPRESSION: Ectopic  radiotracer seen in the right upper quadrant felt to represent bile leak. Gallbladder known to be absent. Prompt visualization of small bowel indicates patency of the common bile duct. These results will be called to the ordering clinician or representative by the Radiologist Assistant, and communication documented in the PACS or zVision Dashboard. Electronically Signed   By: Lowella Grip III M.D.   On: 10/07/2017 16:09   Dg C-arm 1-60 Min-no Report  Result Date: 10/09/2017 Fluoroscopy was utilized by the requesting physician.  No radiographic interpretation.   Ct Image Guided Drainage By Percutaneous Catheter  Result Date: 10/10/2017 INDICATION: 77 year old male with biliary leak and biloma following cholecystectomy. EXAM: CT GUIDED DRAINAGE OF right upper quadrant ABSCESS MEDICATIONS: The patient is currently admitted to the hospital and receiving intravenous antibiotics. The antibiotics were administered within an appropriate time frame prior to the initiation of the procedure. ANESTHESIA/SEDATION: 1 mg IV Versed 50 mcg IV Fentanyl Moderate Sedation Time: 23 minutes The patient was continuously monitored during the procedure by the interventional radiology nurse under my direct supervision. COMPLICATIONS: None immediate. TECHNIQUE: Informed written consent was obtained from the patient after a thorough discussion of the procedural risks, benefits and alternatives. All questions were addressed. Maximal Sterile Barrier Technique was utilized including caps, mask, sterile gowns, sterile gloves, sterile drape, hand hygiene and skin antiseptic. A timeout was performed prior to the initiation of  the procedure. PROCEDURE: The operative field was prepped with Chlorhexidine in a sterile fashion, and a sterile drape was applied covering the operative field. A sterile gown and sterile gloves were used for the procedure. Local anesthesia was provided with 1% Lidocaine. A planning axial CT scan was performed. A  complex fluid collection is present in the perihepatic space. A suitable skin entry site was selected and marked. Local anesthesia was attained by infiltration with 1% lidocaine. A small dermatotomy was made. Under intermittent CT imaging, an 18 gauge trocar needle was advanced into the fluid collection. There was return of frank bile. An Amplatz wire was advanced into the subdiaphragmatic space. The needle was removed. The tract was dilated to 35 [place]. A Flexima percutaneous drainage catheter was modified with several additional sideholes and advanced over the wire into the right upper quadrant fluid collection. Aspiration yields approximately 375 mL of bile. The tube was secured to the skin with 0 Prolene suture. A JP suction bulb was attached. FINDINGS: Final axial CT imaging demonstrates a well-positioned percutaneous drainage catheter with significant reduction in the volume of perihepatic fluid. IMPRESSION: Successful placement of a 14 French drainage catheter modified with additional sideholes into the perihepatic biloma. Aspiration yields approximately 375 mL bilious fluid. Signed, Criselda Peaches, MD [facility] Radiology Electronically Signed   By: Jacqulynn Cadet M.D.   On: 10/10/2017 15:11   US Abdomen Limited Ruq  Result Date: 10/07/2017 CLINICAL DATA:  77 year old male post cholecystectomy with findings on recent hepatobiliary scan raising possibility of bile leak. Subsequent encounter. EXAM: ULTRASOUND ABDOMEN LIMITED RIGHT UPPER QUADRANT COMPARISON:  10/07/2017 hepatic biliary scan. 09/25/2007 preoperative MR. FINDINGS: Gallbladder: Surgically absent. Common bile duct: Diameter: 6 mm. Liver: Complex abnormal fluid collection surrounds the liver and within the gallbladder fossa. This is worrisome for postoperative bile leak which may be coming loculated/infected. CT recommended for further delineation. Right-sided pleural effusion and right  lower lobe consolidation. This may reflect reactive changes or extension of postoperative infectious process/biloma. Portal vein is patent on color Doppler imaging with normal direction of blood flow towards the liver. IMPRESSION: Complex abnormal fluid collection surrounds the liver and within the gallbladder fossa. This is worrisome for postoperative bile leak which may be loculated/infected. CT recommended for further delineation. Right-sided pleural effusion and right lower lobe consolidation. This may reflect reactive changes or extension of postoperative infectious process/biloma. These results will be called to the ordering clinician or representative by the Radiologist Assistant, and communication documented in the PACS or zVision Dashboard. Electronically Signed   By: Genia Del M.D.   On: 10/07/2017 19:07    Labs:  CBC: Recent Labs    10/08/17 0325 10/09/17 1018 10/10/17 0401 10/11/17 0538  WBC 13.1* 9.1 8.4 7.2  HGB 8.0* 8.2* 8.8* 8.4*  HCT 24.6* 24.5* 27.6* 26.3*  PLT 285 242 247 237    COAGS: Recent Labs    09/23/17 1048 10/09/17 0616  INR 1.17 1.27    BMP: Recent Labs    10/07/17 1052 10/08/17 0325 10/10/17 0401 10/11/17 0538  NA 139 142 141 140  K 3.7 4.0 4.1 3.5  CL 109 112* 112* 110  CO2 21* 22 20* 21*  GLUCOSE 103* 107* 158* 104*  BUN 38* 36* 37* 38*  CALCIUM 8.1* 8.1* 7.7* 7.7*  CREATININE 2.28* 2.05* 2.19* 2.17*  GFRNONAA 26* 29* 27* 27*  GFRAA 30* 34* 31* 32*    LIVER FUNCTION TESTS: Recent Labs    10/01/17 0356  10/07/17 1052 10/08/17 0325 10/10/17 0401  BILITOT 2.3* 1.9* 1.8* 1.6*  AST 50* 29 30 35  ALT 31 22 20 25   ALKPHOS 123 151* 130* 134*  PROT 4.1* 4.7* 4.1* 4.2*  ALBUMIN 1.5* 1.6* 1.4* 1.4*    Assessment and Plan:  Biloma drain placed 4/18 OP is significant +Bilious Will follow Plan per GI/ CCS  Electronically Signed: Cherree Conerly A, PA-C 10/11/2017, 1:23 PM   I spent a total of 15 Minutes at the the patient's  bedside AND on the patient's hospital floor or unit, greater than 50% of which was counseling/coordinating care for biloma drain

## 2017-10-11 NOTE — Care Management Important Message (Signed)
Important Message  [name] MRN: [id number] Date of Birth: May 02, 1941   Medicare Important Message Given:  Yes    [name] 10/11/2017, 3:20 PM

## 2017-10-11 NOTE — Progress Notes (Signed)
Progress Note: General Surgery Service   Assessment/Plan: Patient Active Problem List   Diagnosis Date Noted  . Bile leak 10/08/2017  . Bile leak, postoperative 10/07/2017  . Acute cholecystitis   . Choledocholithiasis with acute cholecystitis 09/24/2017  . Abdominal pain 09/23/2017  . Anemia 08/19/2015  . CKD (chronic kidney disease) stage 4, GFR 15-29 ml/min (HCC) 08/19/2015  . BMI 26.0-26.9,adult 05/17/2015  . Preoperative cardiovascular examination 05/17/2015  . Abdominal aortic aneurysm ([place]) 12/02/2013  . Medication management 09/01/2013  . Essential hypertension 07/17/2013  . Prediabetes 07/17/2013  . Gout   . Vitamin D deficiency   . 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 Carotid artery disease ([place]) 01/15/2013  . Right bundle branch block 01/15/2013  . Hyperlipidemia 01/15/2013  . Herniated lumbar intervertebral disc 06/06/2012   s/p Procedure(s): ENDOSCOPIC RETROGRADE CHOLANGIOPANCREATOGRAPHY (ERCP) 10/09/2017 -confusion overnight -IR drain functioning well    LOS: 4 days  Chief Complaint/Subjective: Confusion overnight, some confusion has been happening at home, currently no pain or complaints  Objective: Vital signs in last 24 hours: Temp:  [97.4 F (36.3 C)-97.9 F (36.6 C)] 97.6 F (36.4 C) (04/19 0306) Pulse Rate:  [46-53] 46 (04/19 0306) Resp:  [16-18] 18 (04/19 0306) BP: (129-143)/(55-58) 143/56 (04/19 0306) SpO2:  [93 %-97 %] 94 % (04/19 0306) Last BM Date: 10/10/17  Intake/Output from previous day: 04/18 0701 - 04/19 0700 In: 380 [I.V.:330; IV Piggyback:50] Out: 800 [Drains:800] Intake/Output this shift: No intake/output data recorded.  Lungs: CTAb  Cardiovascular: bradycardic  Abd: soft, NT, ND, blake with minimal output, IR drain with bilious drainage  Extremities: no edema  Neuro: A, Ox3  Lab Results: CBC  Recent Labs    10/10/17 0401 10/11/17 0538  WBC 8.4 7.2  HGB 8.8* 8.4*  HCT 27.6* 26.3*  PLT 247 237   BMET Recent Labs     10/10/17 0401 10/11/17 0538  NA 141 140  K 4.1 3.5  CL 112* 110  CO2 20* 21*  GLUCOSE 158* 104*  BUN 37* 38*  CREATININE 2.19* 2.17*  CALCIUM 7.7* 7.7*   PT/INR Recent Labs    10/09/17 0616  LABPROT 15.8*  INR 1.27   ABG No results for input(s): PHART, HCO3 in the last 72 hours.  Invalid input(s): PCO2, PO2  Studies/Results:  Anti-infectives: Anti-infectives (From admission, onward)   Start     Dose/Rate Route Frequency Ordered Stop   10/11/17 0300  piperacillin-tazobactam (ZOSYN) IVPB 3.375 g     3.375 g 12.5 mL/hr over 240 Minutes Intravenous Every 8 hours 10/10/17 1851     10/08/17 1600  piperacillin-tazobactam (ZOSYN) IVPB 3.375 g  Status:  Discontinued     3.375 g 12.5 mL/hr over 240 Minutes Intravenous Every 8 hours 10/08/17 0837 10/10/17 1851   10/08/17 0900  piperacillin-tazobactam (ZOSYN) IVPB 3.375 g     3.375 g 100 mL/hr over 30 Minutes Intravenous  Once 10/08/17 0851 10/08/17 1100      Medications: Scheduled Meds: . amLODipine  5 mg Oral Daily  . brimonidine  1 drop Both Eyes BID  . dorzolamide-timolol  1 drop Both Eyes BID  . enoxaparin (LOVENOX) injection  40 mg Subcutaneous Q24H  . furosemide  40 mg Oral Daily  . loteprednol  1 drop Right Eye BID  . nepafenac  1 drop Right Eye TID   Continuous Infusions: . dextrose 5 % and 0.45 % NaCl with KCl 20 mEq/L 75 mL/hr at 10/11/17 0155  . famotidine (PEPCID) IV Stopped (10/10/17 1405)  . piperacillin-tazobactam (ZOSYN)  IV 3.375 g (10/11/17 0316)   PRN Meds:.acetaminophen **OR** acetaminophen, diphenhydrAMINE **OR** diphenhydrAMINE, haloperidol lactate, morphine injection, ondansetron **OR** ondansetron (ZOFRAN) IV, polyethylene glycol, simethicone, traMADol, zolpidem  Mickeal Skinner, MD Pg# [phone] [facility]

## 2017-10-11 NOTE — Progress Notes (Signed)
Daily Rounding Note  10/11/2017, 12:30 PM  LOS: 4 days   SUBJECTIVE:   Chief complaint:     Initial output of bile 375 mL's after biloma drain placed by IR.  A total of 800 mL output recorded yesterday from both drains, 630 ml from the new biloma drain, the other from the drain placed at surgery..   Confusion overnight. Has not c/o of abd pain or nausea.  Wife has bee attentive at bedside.    OBJECTIVE:         Vital signs in last 24 hours:    Temp:  [97.4 F (36.3 C)-97.9 F (36.6 C)] 97.6 F (36.4 C) (04/19 0306) Pulse Rate:  [46-53] 46 (04/19 0306) Resp:  [16-18] 18 (04/19 0306) BP: (129-143)/(55-58) 143/56 (04/19 0306) SpO2:  [93 %-97 %] 94 % (04/19 0306) Last BM Date: 10/10/17 Filed Weights   10/07/17 1146 10/09/17 1245  Weight: 180 lb 14.4 oz (82.1 kg) 180 lb (81.6 kg)   General: resting quietly.  comfortable   Heart: RRR Chest: clear bil.  Open mouthed breathing, dry oral MM.  No labored breathing or cough Abdomen: soft, NT.  Both JP drains contain bile  Extremities: no CCE Neuro/Psych:  Alert, follows commands.  Not a lot to say.    Intake/Output from previous day: 04/18 0701 - 04/19 0700 In: 380 [I.V.:330; IV Piggyback:50] Out: 800 [Drains:800]  Intake/Output this shift: No intake/output data recorded.  Lab Results: Recent Labs    10/09/17 1018 10/10/17 0401 10/11/17 0538  WBC 9.1 8.4 7.2  HGB 8.2* 8.8* 8.4*  HCT 24.5* 27.6* 26.3*  PLT 242 247 237   BMET Recent Labs    10/10/17 0401 10/11/17 0538  NA 141 140  K 4.1 3.5  CL 112* 110  CO2 20* 21*  GLUCOSE 158* 104*  BUN 37* 38*  CREATININE 2.19* 2.17*  CALCIUM 7.7* 7.7*   LFT Recent Labs    10/10/17 0401  PROT 4.2*  ALBUMIN 1.4*  AST 35  ALT 25  ALKPHOS 134*  BILITOT 1.6*   PT/INR Recent Labs    10/09/17 0616  LABPROT 15.8*  INR 1.27   Hepatitis Panel No results for input(s): HEPBSAG, HCVAB, HEPAIGM, HEPBIGM in the  last 72 hours.  Studies/Results: Dg C-arm 1-60 Min-no Report  Result Date: 10/09/2017 Fluoroscopy was utilized by the requesting physician.  No radiographic interpretation.   Ct Image Guided Drainage By Percutaneous Catheter  Result Date: 10/10/2017 INDICATION: 77 year old male with biliary leak and biloma following cholecystectomy. EXAM: CT GUIDED DRAINAGE OF right upper quadrant ABSCESS MEDICATIONS: The patient is currently admitted to the hospital and receiving intravenous antibiotics. The antibiotics were administered within an appropriate time frame prior to the initiation of the procedure. ANESTHESIA/SEDATION: 1 mg IV Versed 50 mcg IV Fentanyl Moderate Sedation Time: 23 minutes The patient was continuously monitored during the procedure by the interventional radiology nurse under my direct supervision. COMPLICATIONS: None immediate. TECHNIQUE: Informed written consent was obtained from the patient after a thorough discussion of the procedural risks, benefits and alternatives. All questions were addressed. Maximal Sterile Barrier Technique was utilized including caps, mask, sterile gowns, sterile gloves, sterile drape, hand hygiene and skin antiseptic. A timeout was performed prior to the initiation of the procedure. PROCEDURE: The operative field was prepped with Chlorhexidine in a sterile fashion, and a sterile drape was applied covering the operative field. A sterile gown and sterile gloves were used for the procedure. Local  anesthesia was provided with 1% Lidocaine. A planning axial CT scan was performed. A complex fluid collection is present in the perihepatic space. A suitable skin entry site was selected and marked. Local anesthesia was attained by infiltration with 1% lidocaine. A small dermatotomy was made. Under intermittent CT imaging, an 18 gauge trocar needle was advanced into the fluid collection. There was return of frank bile. An Amplatz wire was advanced into the subdiaphragmatic  space. The needle was removed. The tract was dilated to 57 [place]. A Flexima percutaneous drainage catheter was modified with several additional sideholes and advanced over the wire into the right upper quadrant fluid collection. Aspiration yields approximately 375 mL of bile. The tube was secured to the skin with 0 Prolene suture. A JP suction bulb was attached. FINDINGS: Final axial CT imaging demonstrates a well-positioned percutaneous drainage catheter with significant reduction in the volume of perihepatic fluid. IMPRESSION: Successful placement of a 14 French drainage catheter modified with additional sideholes into the perihepatic biloma. Aspiration yields approximately 375 mL bilious fluid. Signed, Criselda Peaches, MD [facility] Radiology Electronically Signed   By: Jacqulynn Cadet M.D.   On: 10/10/2017 15:11   Scheduled Meds: . amLODipine  5 mg Oral Daily  . brimonidine  1 drop Both Eyes BID  . dorzolamide-timolol  1 drop Both Eyes BID  . enoxaparin (LOVENOX) injection  40 mg Subcutaneous Q24H  . furosemide  40 mg Oral Daily  . loteprednol  1 drop Right Eye BID  . nepafenac  1 drop Right Eye TID   Continuous Infusions: . dextrose 5 % and 0.45 % NaCl with KCl 20 mEq/L 10 mL/hr at 10/11/17 1102  . famotidine (PEPCID) IV 20 mg (10/11/17 1059)  . piperacillin-tazobactam (ZOSYN)  IV 3.375 g (10/11/17 1058)   PRN Meds:.acetaminophen **OR** acetaminophen, diphenhydrAMINE **OR** diphenhydrAMINE, haloperidol lactate, morphine injection, ondansetron **OR** ondansetron (ZOFRAN) IV, polyethylene glycol, simethicone, traMADol, zolpidem  ASSESMENT:   *  09/26/17 cholecystectomy.  *    Postop bile leak, confirmed on HIDA. 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 S/P 4/18 drain placement to biloma.  The new drain is functioning well, the surgically placed JP drain is also draining bile  Zosyn discontinued,  completed 3 days as per Dr Ardis Hughs rec.  No fevers.  WBCs 18.9 >> 7.2.      PLAN   *   Had several everal telephone calls with staff at [facility] regarding either transfer to [place] for ERCP  or day trip to [facility] for ERCP. as of mid afternoon yesterday they were to reach out to GI staff regarding plan.  Never heard back from [facility] after that.  Just in case they are needed, the GI scheduler's, Darryl Lent, phone [phone].  Suspect, with the holiday that she is not available but have not tried to reach her today. Another phone number provided to me yesterday was that of the endoscopy unit who are able to reach out to the GI staff, phone numbers are [phone] and [phone] Phone # for the [name] transfer line is [phone]    Azucena Freed  10/11/2017, 12:30 PM Phone [phone]

## 2017-10-12 NOTE — Progress Notes (Signed)
For [name] on Monday 10/14/17.  Pt must be at [facility] by 930am.  We must schedule round trip for Pt to [name] for procedure and back to [facility] after procedure.  The address is [place].  Must be NPO at [facility] prior to procedure.  Call for report prior to sending pt Monday morning.  Contact info is [phone].  [facility] says wife can travel with pt to [facility], she must sit in front seat.

## 2017-10-12 NOTE — Progress Notes (Signed)
3 Days Post-Op   Subjective/Chief Complaint: No complaints Denies pain Tolerating po   Objective: Vital signs in last 24 hours: Temp:  [97.7 F (36.5 C)-97.9 F (36.6 C)] 97.9 F (36.6 C) (04/20 0514) Pulse Rate:  [45-55] 51 (04/20 0514) Resp:  [18] 18 (04/20 0514) BP: (142-149)/(50-57) 149/57 (04/20 0514) SpO2:  [95 %-99 %] 95 % (04/20 0514) Last BM Date: 10/11/17  Intake/Output from previous day: 04/19 0701 - 04/20 0700 In: 700.4 [I.V.:550.4; IV Piggyback:150] Out: 3970 [Urine:3560; Drains:410] Intake/Output this shift: No intake/output data recorded.  Exam: Looks comfortable Abdomen soft, NT Drains with bile  Lab Results:  Recent Labs    10/10/17 0401 10/11/17 0538  WBC 8.4 7.2  HGB 8.8* 8.4*  HCT 27.6* 26.3*  PLT 247 237   BMET Recent Labs    10/10/17 0401 10/11/17 0538  NA 141 140  K 4.1 3.5  CL 112* 110  CO2 20* 21*  GLUCOSE 158* 104*  BUN 37* 38*  CREATININE 2.19* 2.17*  CALCIUM 7.7* 7.7*   PT/INR No results for input(s): LABPROT, INR in the last 72 hours. ABG No results for input(s): PHART, HCO3 in the last 72 hours.  Invalid input(s): PCO2, PO2  Studies/Results: Ct Image Guided Drainage By Percutaneous Catheter  Result Date: 10/10/2017 INDICATION: 77 year old male with biliary leak and biloma following cholecystectomy. EXAM: CT GUIDED DRAINAGE OF right upper quadrant ABSCESS MEDICATIONS: The patient is currently admitted to the hospital and receiving intravenous antibiotics. The antibiotics were administered within an appropriate time frame prior to the initiation of the procedure. ANESTHESIA/SEDATION: 1 mg IV Versed 50 mcg IV Fentanyl Moderate Sedation Time: 23 minutes The patient was continuously monitored during the procedure by the interventional radiology nurse under my direct supervision. COMPLICATIONS: None immediate. TECHNIQUE: Informed written consent was obtained from the patient after a thorough discussion of the procedural risks,  benefits and alternatives. All questions were addressed. Maximal Sterile Barrier Technique was utilized including caps, mask, sterile gowns, sterile gloves, sterile drape, hand hygiene and skin antiseptic. A timeout was performed prior to the initiation of the procedure. PROCEDURE: The operative field was prepped with Chlorhexidine in a sterile fashion, and a sterile drape was applied covering the operative field. A sterile gown and sterile gloves were used for the procedure. Local anesthesia was provided with 1% Lidocaine. A planning axial CT scan was performed. A complex fluid collection is present in the perihepatic space. A suitable skin entry site was selected and marked. Local anesthesia was attained by infiltration with 1% lidocaine. A small dermatotomy was made. Under intermittent CT imaging, an 18 gauge trocar needle was advanced into the fluid collection. There was return of frank bile. An Amplatz wire was advanced into the subdiaphragmatic space. The needle was removed. The tract was dilated to 18 [place]. A Flexima percutaneous drainage catheter was modified with several additional sideholes and advanced over the wire into the right upper quadrant fluid collection. Aspiration yields approximately 375 mL of bile. The tube was secured to the skin with 0 Prolene suture. A JP suction bulb was attached. FINDINGS: Final axial CT imaging demonstrates a well-positioned percutaneous drainage catheter with significant reduction in the volume of perihepatic fluid. IMPRESSION: Successful placement of a 14 French drainage catheter modified with additional sideholes into the perihepatic biloma. Aspiration yields approximately 375 mL bilious fluid. Signed, Terry Peaches, MD [facility] Electronically Signed   By: Terry Garner M.D.   On: 10/10/2017 15:11  Anti-infectives: Anti-infectives (From admission, onward)   Start     Dose/Rate Route  Frequency Ordered Stop   10/11/17 0300  piperacillin-tazobactam (ZOSYN) IVPB 3.375 g     3.375 g 12.5 mL/hr over 240 Minutes Intravenous Every 8 hours 10/10/17 1851     10/08/17 1600  piperacillin-tazobactam (ZOSYN) IVPB 3.375 g  Status:  Discontinued     3.375 g 12.5 mL/hr over 240 Minutes Intravenous Every 8 hours 10/08/17 0837 10/10/17 1851   10/08/17 0900  piperacillin-tazobactam (ZOSYN) IVPB 3.375 g     3.375 g 100 mL/hr over 30 Minutes Intravenous  Once 10/08/17 0851 10/08/17 1100      Assessment/Plan: s/p Procedure(s): ENDOSCOPIC RETROGRADE CHOLANGIOPANCREATOGRAPHY (ERCP) (N/A)  S/p lap chole, bile leak, IR placed drains  Going to [name] on Monday for attempt at an ERCP and stent. Continuing current care until then  LOS: 5 days    Terry Garner A 10/12/2017

## 2017-10-13 NOTE — Progress Notes (Signed)
[facility] MD on call Dr. Kieth Brightly notified that transportation has been set up VIA [facility] tomorrow morning for ERCP at [facility]

## 2017-10-13 NOTE — Progress Notes (Signed)
Received call from GI-PA- Tye Savoy- regarding patients outpt appointment at [facility] tomorrow and need for transport- spoke with Elta Guadeloupe at [facility]- and they will not be able to provide transport due to pt going to an outpt procedure location and pt is currently inpt here. Pt would need to be acute inpt to inpt transfer for [facility] to be able to transport. Per Elta Guadeloupe - Dr. Kieth Brightly has spoken with [facility] regarding the situation.

## 2017-10-13 NOTE — Progress Notes (Signed)
   Came by to check on patient. He is sleeping comfortably, I didn't awake him. Patient is going to [facility] tomorrow for ERCP with stent placement for post-op bile leak. He will return to [facility] following procedure., will follow up when he returns.

## 2017-10-13 NOTE — Progress Notes (Signed)
Rn spoke with [facility] about transferring pt to [place] tomorrow for ERCP, [facility] will not be able to transfer pt to [facility] for out pt procedure, RN spoke with Dr Alvino Blood and made him aware

## 2017-10-13 NOTE — Progress Notes (Addendum)
Update regarding transport to [name] for ERCP procedure on 10/14/17- spoke with Dr. Hilarie Fredrickson- pt will be going to [facility] to outpt endo suite for procedure- accepting MD is Justice Britain who will oversee pt while at the clinic. Have again spoken with [facility] who is still unable to transport pt, [name] is also unable to provide transport- reached out to [facility] regarding transport- spoke with dispatch who states that they can provide transport there and back tomorrow for procedure.  Unit staff will need to call [facility] dispatch in the am- around 7:15 am to schedule transport # to call is [phone] (ext 1, opt. 3)- CM has called [facility] to update on situation- and bedside RN- Junie Panning- who will update MD.  (of note pt's wife does not drive)

## 2017-10-13 NOTE — Progress Notes (Signed)
Progress Note: General Surgery Service   Assessment/Plan: Patient Active Problem List   Diagnosis Date Noted  . Bile leak 10/08/2017  . Bile leak, postoperative 10/07/2017  . Acute cholecystitis   . Choledocholithiasis with acute cholecystitis 09/24/2017  . Abdominal pain 09/23/2017  . Anemia 08/19/2015  . CKD (chronic kidney disease) stage 4, GFR 15-29 ml/min (HCC) 08/19/2015  . BMI 26.0-26.9,adult 05/17/2015  . Preoperative cardiovascular examination 05/17/2015  . Abdominal aortic aneurysm ([place]) 12/02/2013  . Medication management 09/01/2013  . Essential hypertension 07/17/2013  . Prediabetes 07/17/2013  . Gout   . Vitamin D deficiency   . 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 Carotid artery disease ([place]) 01/15/2013  . Right bundle branch block 01/15/2013  . Hyperlipidemia 01/15/2013  . Herniated lumbar intervertebral disc 06/06/2012   s/p Procedure(s): ENDOSCOPIC RETROGRADE CHOLANGIOPANCREATOGRAPHY (ERCP) 10/09/2017 -transfer to [place] for procedure in the morning and plan to return to [facility] later in the day -NPO after midnight -maintain drain    LOS: 6 days  Chief Complaint/Subjective: No pain, tolerating diet  Objective: Vital signs in last 24 hours: Temp:  [97.8 F (36.6 C)-98.8 F (37.1 C)] 97.8 F (36.6 C) (04/21 0603) Pulse Rate:  [45-53] 45 (04/21 0603) Resp:  [16-18] 18 (04/21 0603) BP: (145-148)/(52-54) 148/53 (04/21 0603) SpO2:  [94 %-98 %] 95 % (04/21 0603) Last BM Date: 10/12/17  Intake/Output from previous day: 04/20 0701 - 04/21 0700 In: 167.7 [I.V.:117.7; IV Piggyback:50] Out: 9485 [Urine:1600; Drains:150] Intake/Output this shift: Total I/O In: -  Out: 150 [Urine:150]  Lungs: CTAB  Cardiovascular: bradycardic  Abd: soft, NT, drain in place with bilious output  Extremities: no edema  Neuro: AOx4  Lab Results: CBC  Recent Labs    10/11/17 0538  WBC 7.2  HGB 8.4*  HCT 26.3*  PLT 237   BMET Recent Labs    10/11/17 0538  NA 140  K 3.5  CL  110  CO2 21*  GLUCOSE 104*  BUN 38*  CREATININE 2.17*  CALCIUM 7.7*   PT/INR No results for input(s): LABPROT, INR in the last 72 hours. ABG No results for input(s): PHART, HCO3 in the last 72 hours.  Invalid input(s): PCO2, PO2  Studies/Results:  Anti-infectives: Anti-infectives (From admission, onward)   Start     Dose/Rate Route Frequency Ordered Stop   10/11/17 0300  piperacillin-tazobactam (ZOSYN) IVPB 3.375 g     3.375 g 12.5 mL/hr over 240 Minutes Intravenous Every 8 hours 10/10/17 1851     10/08/17 1600  piperacillin-tazobactam (ZOSYN) IVPB 3.375 g  Status:  Discontinued     3.375 g 12.5 mL/hr over 240 Minutes Intravenous Every 8 hours 10/08/17 0837 10/10/17 1851   10/08/17 0900  piperacillin-tazobactam (ZOSYN) IVPB 3.375 g     3.375 g 100 mL/hr over 30 Minutes Intravenous  Once 10/08/17 0851 10/08/17 1100      Medications: Scheduled Meds: . amLODipine  5 mg Oral Daily  . brimonidine  1 drop Both Eyes BID  . dorzolamide-timolol  1 drop Both Eyes BID  . enoxaparin (LOVENOX) injection  40 mg Subcutaneous Q24H  . furosemide  40 mg Oral Daily  . loteprednol  1 drop Right Eye BID  . nepafenac  1 drop Right Eye TID   Continuous Infusions: . dextrose 5 % and 0.45 % NaCl with KCl 20 mEq/L 10 mL/hr at 10/11/17 1102  . piperacillin-tazobactam (ZOSYN)  IV Stopped (10/13/17 0812)   PRN Meds:.acetaminophen **OR** acetaminophen, diphenhydrAMINE **OR** diphenhydrAMINE, haloperidol lactate, morphine injection, ondansetron **OR** ondansetron (  ZOFRAN) IV, polyethylene glycol, simethicone, traMADol, zolpidem  Mickeal Skinner, MD Pg# [phone] [facility]

## 2017-10-14 DIAGNOSIS — Z955 Presence of coronary angioplasty implant and graft: Secondary | ICD-10-CM | POA: Diagnosis not present

## 2017-10-14 DIAGNOSIS — K9189 Other postprocedural complications and disorders of digestive system: Secondary | ICD-10-CM | POA: Diagnosis not present

## 2017-10-14 DIAGNOSIS — D649 Anemia, unspecified: Secondary | ICD-10-CM | POA: Diagnosis not present

## 2017-10-14 DIAGNOSIS — K8050 Calculus of bile duct without cholangitis or cholecystitis without obstruction: Secondary | ICD-10-CM | POA: Diagnosis not present

## 2017-10-14 DIAGNOSIS — I129 Hypertensive chronic kidney disease with stage 1 through stage 4 chronic kidney disease, or unspecified chronic kidney disease: Secondary | ICD-10-CM | POA: Diagnosis not present

## 2017-10-14 DIAGNOSIS — N189 Chronic kidney disease, unspecified: Secondary | ICD-10-CM | POA: Diagnosis not present

## 2017-10-14 DIAGNOSIS — Z9889 Other specified postprocedural states: Secondary | ICD-10-CM | POA: Diagnosis not present

## 2017-10-14 DIAGNOSIS — I2510 Atherosclerotic heart disease of native coronary artery without angina pectoris: Secondary | ICD-10-CM | POA: Diagnosis not present

## 2017-10-14 DIAGNOSIS — Z4659 Encounter for fitting and adjustment of other gastrointestinal appliance and device: Secondary | ICD-10-CM | POA: Diagnosis not present

## 2017-10-14 DIAGNOSIS — K838 Other specified diseases of biliary tract: Secondary | ICD-10-CM | POA: Diagnosis not present

## 2017-10-14 NOTE — Progress Notes (Signed)
Called [facility] to put pt on list to go to [facility] clinic for ERCP this morning. Will continue to monitor pt. Ranelle Oyster, RN

## 2017-10-14 NOTE — Progress Notes (Signed)
Pt transferred by [facility] to [facility] for outpatient ERCP. Pt remained NPO, IV intact. JP drains and external catheter emptied prior to transport. Wife at side, transporting with Pt.

## 2017-10-14 NOTE — Progress Notes (Signed)
Report received from [facility] regarding Pt's ERCP was completed. Pt stable per RN. Per RN Pt was given scheduled IV Zosyn. Awaiting transportation via [facility] back to [facility].

## 2017-10-15 NOTE — Progress Notes (Signed)
  Progress Note: General Surgery Service   Assessment/Plan: Patient Active Problem List   Diagnosis Date Noted  . Bile leak 10/08/2017  . Bile leak, postoperative 10/07/2017  . Acute cholecystitis   . Choledocholithiasis with acute cholecystitis 09/24/2017  . Abdominal pain 09/23/2017  . Anemia 08/19/2015  . CKD (chronic kidney disease) stage 4, GFR 15-29 ml/min (HCC) 08/19/2015  . BMI 26.0-26.9,adult 05/17/2015  . Preoperative cardiovascular examination 05/17/2015  . Abdominal aortic aneurysm ([place]) 12/02/2013  . Medication management 09/01/2013  . Essential hypertension 07/17/2013  . Prediabetes 07/17/2013  . Gout   . Vitamin D deficiency   . 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 Carotid artery disease ([place]) 01/15/2013  . Right bundle branch block 01/15/2013  . Hyperlipidemia 01/15/2013  . Herniated lumbar intervertebral disc 06/06/2012   s/p Procedure(s): ENDOSCOPIC RETROGRADE CHOLANGIOPANCREATOGRAPHY (ERCP) 10/09/2017 -transferred to [place] yesterday for ERCP/Stent -repeat labs -maintain drain; monitor output    LOS: 7 days  Chief Complaint/Subjective: Went to [facility] for ERCP yesterday. Labs pending from this morning. Denies complaints today. "Feeling great." Denies n/v. Denies abdominal pain.  Objective: Vital signs in last 24 hours: Temp:  [97.7 F (36.5 C)-97.8 F (36.6 C)] 97.7 F (36.5 C) (04/23 0451) Pulse Rate:  [61-70] 61 (04/23 0451) Resp:  [18] 18 (04/23 0451) BP: (149-150)/(53-66) 149/53 (04/23 0451) SpO2:  [97 %-98 %] 98 % (04/23 0451) Last BM Date: 10/13/17  Intake/Output from previous day: 04/22 0701 - 04/23 0700 In: 50 [IV Piggyback:50] Out: 980 [Urine:900; Drains:80] Intake/Output this shift: No intake/output data recorded.  Lungs: CTAB  Cardiovascular: bradycardic  Abd: soft, NT, drain in place; bile in drain - 80cc over last 24hrs  Extremities: no edema  Neuro: AOx4  Lab Results: CBC  No results for input(s): WBC, HGB, HCT, PLT in the last 72  hours. BMET No results for input(s): NA, K, CL, CO2, GLUCOSE, BUN, CREATININE, CALCIUM in the last 72 hours. PT/INR No results for input(s): LABPROT, INR in the last 72 hours. ABG No results for input(s): PHART, HCO3 in the last 72 hours.  Invalid input(s): PCO2, PO2  Studies/Results:  Anti-infectives: Anti-infectives (From admission, onward)   Start     Dose/Rate Route Frequency Ordered Stop   10/11/17 0300  piperacillin-tazobactam (ZOSYN) IVPB 3.375 g     3.375 g 12.5 mL/hr over 240 Minutes Intravenous Every 8 hours 10/10/17 1851     10/08/17 1600  piperacillin-tazobactam (ZOSYN) IVPB 3.375 g  Status:  Discontinued     3.375 g 12.5 mL/hr over 240 Minutes Intravenous Every 8 hours 10/08/17 0837 10/10/17 1851   10/08/17 0900  piperacillin-tazobactam (ZOSYN) IVPB 3.375 g     3.375 g 100 mL/hr over 30 Minutes Intravenous  Once 10/08/17 0851 10/08/17 1100      Medications: Scheduled Meds: . amLODipine  5 mg Oral Daily  . brimonidine  1 drop Both Eyes BID  . dorzolamide-timolol  1 drop Both Eyes BID  . enoxaparin (LOVENOX) injection  40 mg Subcutaneous Q24H  . furosemide  40 mg Oral Daily  . loteprednol  1 drop Right Eye BID  . nepafenac  1 drop Right Eye TID   Continuous Infusions: . dextrose 5 % and 0.45 % NaCl with KCl 20 mEq/L 10 mL/hr at 10/11/17 1102  . piperacillin-tazobactam (ZOSYN)  IV Stopped (10/15/17 0517)   PRN Meds:.acetaminophen **OR** acetaminophen, diphenhydrAMINE **OR** diphenhydrAMINE, haloperidol lactate, morphine injection, ondansetron **OR** ondansetron (ZOFRAN) IV, polyethylene glycol, simethicone, traMADol, zolpidem  Sharon Mt. Dema Severin, M.D. [facility]

## 2017-10-15 NOTE — Progress Notes (Signed)
Pt zosyn that was suppose to be given at 0300 was rather given this morning  Pt had the one that was suppose to be given at 2000 late since pt was out for his procedure, did not let me scan it in but it been hang up to infuse

## 2017-10-16 LAB — CBC
HCT: 27.9 % — ABNORMAL LOW (ref 39.0–52.0)
HEMOGLOBIN: 9.3 g/dL — AB (ref 13.0–17.0)
MCH: 29.5 pg (ref 26.0–34.0)
MCHC: 33.3 g/dL (ref 30.0–36.0)
MCV: 88.6 fL (ref 78.0–100.0)
PLATELETS: 265 10*3/uL (ref 150–400)
RBC: 3.15 MIL/uL — AB (ref 4.22–5.81)
RDW: 16.1 % — ABNORMAL HIGH (ref 11.5–15.5)
WBC: 7.2 10*3/uL (ref 4.0–10.5)

## 2017-10-16 LAB — COMPREHENSIVE METABOLIC PANEL
ALBUMIN: 1.8 g/dL — AB (ref 3.5–5.0)
ALK PHOS: 132 U/L — AB (ref 38–126)
ALT: 28 U/L (ref 17–63)
ANION GAP: 8 (ref 5–15)
AST: 36 U/L (ref 15–41)
BUN: 35 mg/dL — ABNORMAL HIGH (ref 6–20)
CALCIUM: 8 mg/dL — AB (ref 8.9–10.3)
CHLORIDE: 105 mmol/L (ref 101–111)
CO2: 25 mmol/L (ref 22–32)
Creatinine, Ser: 2.33 mg/dL — ABNORMAL HIGH (ref 0.61–1.24)
GFR calc non Af Amer: 25 mL/min — ABNORMAL LOW (ref >60)
GFR, EST AFRICAN AMERICAN: 29 mL/min — AB (ref >60)
GLUCOSE: 95 mg/dL (ref 65–99)
Potassium: 3.5 mmol/L (ref 3.5–5.1)
SODIUM: 138 mmol/L (ref 135–145)
Total Bilirubin: 1.3 mg/dL — ABNORMAL HIGH (ref 0.3–1.2)
Total Protein: 4.4 g/dL — ABNORMAL LOW (ref 6.5–8.1)

## 2017-10-16 NOTE — Progress Notes (Addendum)
7 Days Post-Op  Subjective: Stable and alert.  No fever.  Denies nausea or vomiting.  Says he is hungry. Tolerating clear liquids No stool reported Has 2 drains.  Bilious.  100 cc out yesterday.  WBC 7,200. LFT's normal.   Objective: Vital signs in last 24 hours: Temp:  [97.7 F (36.5 C)-98.1 F (36.7 C)] 97.7 F (36.5 C) (04/24 0519) Pulse Rate:  [51-63] 56 (04/24 0519) Resp:  [16-18] 18 (04/24 0519) BP: (106-148)/(55-59) 148/59 (04/24 0519) SpO2:  [96 %-98 %] 96 % (04/24 0519) Last BM Date: 10/13/17  Intake/Output from previous day: 04/23 0701 - 04/24 0700 In: 150 [IV Piggyback:150] Out: 250 [Urine:150; Drains:100] Intake/Output this shift: No intake/output data recorded.  General appearance: Alert.  Mild confusion but pleasant.  No agitation.  No distress. Resp: clear to auscultation bilaterally GI: Abdomen soft.  Scaphoid.  Nontender.  Drain is bilious.  Nonpurulent.  Lab Results:  No results found for this or any previous visit (from the past 24 hour(s)).   Studies/Results: No results found.  [name] amLODipine  5 mg Oral Daily  . brimonidine  1 drop Both Eyes BID  . dorzolamide-timolol  1 drop Both Eyes BID  . enoxaparin (LOVENOX) injection  40 mg Subcutaneous Q24H  . furosemide  40 mg Oral Daily  . loteprednol  1 drop Right Eye BID  . nepafenac  1 drop Right Eye TID     Assessment/Plan: s/p Procedure(s): ENDOSCOPIC RETROGRADE CHOLANGIOPANCREATOGRAPHY (ERCP)  Status post subtotal cholecystectomy April 4 by Dr. [name].  Severe cholecystitis  Postop bile leak.  Failed ERCP April 17. IR drain April 18 ERCP successful at [facility] April 22 Monitoring drainage output L;eukocytosis resolved. Advance to full liquid diet D/C antibiotics  Significantly deconditioned.  Will involve PT and OT.  @PROBHOSP @  LOS: 8 days    Adin Hector 10/16/2017  . .prob

## 2017-10-17 LAB — COMPREHENSIVE METABOLIC PANEL
ALBUMIN: 1.9 g/dL — AB (ref 3.5–5.0)
ALK PHOS: 146 U/L — AB (ref 38–126)
ALT: 29 U/L (ref 17–63)
ANION GAP: 9 (ref 5–15)
AST: 38 U/L (ref 15–41)
BILIRUBIN TOTAL: 1.1 mg/dL (ref 0.3–1.2)
BUN: 32 mg/dL — ABNORMAL HIGH (ref 6–20)
CALCIUM: 8 mg/dL — AB (ref 8.9–10.3)
CO2: 23 mmol/L (ref 22–32)
Chloride: 108 mmol/L (ref 101–111)
Creatinine, Ser: 2.16 mg/dL — ABNORMAL HIGH (ref 0.61–1.24)
GFR, EST AFRICAN AMERICAN: 32 mL/min — AB (ref >60)
GFR, EST NON AFRICAN AMERICAN: 28 mL/min — AB (ref >60)
GLUCOSE: 89 mg/dL (ref 65–99)
POTASSIUM: 3.5 mmol/L (ref 3.5–5.1)
Sodium: 140 mmol/L (ref 135–145)
TOTAL PROTEIN: 4.3 g/dL — AB (ref 6.5–8.1)

## 2017-10-17 NOTE — Progress Notes (Signed)
[facility] Progress Note  8 Days Post-Op  Subjective: CC: diarrhea Patient tolerated FLD without pain, n/v. Reported some loose stools overnight. Denies abdominal pain currently. Wants to know when he'll be getting home. Has not been out of bed yet. UOP good. VSS.   Objective: Vital signs in last 24 hours: Temp:  [97.6 F (36.4 C)-97.8 F (36.6 C)] 97.8 F (36.6 C) (04/25 0414) Pulse Rate:  [48-56] 56 (04/25 0414) Resp:  [18-20] 18 (04/25 0414) BP: (138-155)/(50-55) 155/50 (04/25 0414) SpO2:  [97 %-98 %] 97 % (04/25 0414) Last BM Date: 10/16/17  Intake/Output from previous day: 04/24 0701 - 04/25 0700 In: 730.3 [I.V.:680.3; IV Piggyback:50] Out: 848 [Urine:800; Drains:45; Stool:3] Intake/Output this shift: No intake/output data recorded.  PE: Gen:  Alert, NAD, pleasant Card:  Regular rate and rhythm, pedal pulses 2+ BL Pulm:  Normal effort, clear to auscultation bilaterally Abd: Soft, non-tender, non-distended, bowel sounds present, no HSM, incisions C/D/I; drain in R abdomen with bilious output Skin: warm and dry, no rashes  Psych: A&Ox3   Lab Results:  Recent Labs    10/16/17 0816  WBC 7.2  HGB 9.3*  HCT 27.9*  PLT 265   BMET Recent Labs    10/16/17 0816 10/17/17 0355  NA 138 140  K 3.5 3.5  CL 105 108  CO2 25 23  GLUCOSE 95 89  BUN 35* 32*  CREATININE 2.33* 2.16*  CALCIUM 8.0* 8.0*   PT/INR No results for input(s): LABPROT, INR in the last 72 hours. CMP     Component Value Date/Time   NA 140 10/17/2017 0355   K 3.5 10/17/2017 0355   CL 108 10/17/2017 0355   CO2 23 10/17/2017 0355   GLUCOSE 89 10/17/2017 0355   BUN 32 (H) 10/17/2017 0355   CREATININE 2.16 (H) 10/17/2017 0355   CREATININE 2.80 (H) 07/15/2017 1405   CALCIUM 8.0 (L) 10/17/2017 0355   PROT 4.3 (L) 10/17/2017 0355   ALBUMIN 1.9 (L) 10/17/2017 0355   AST 38 10/17/2017 0355   ALT 29 10/17/2017 0355   ALKPHOS 146 (H) 10/17/2017 0355   BILITOT 1.1 10/17/2017 0355   GFRNONAA 28 (L) 10/17/2017 0355   GFRNONAA 21 (L) 07/15/2017 1405   GFRAA 32 (L) 10/17/2017 0355   GFRAA 24 (L) 07/15/2017 1405   Lipase     Component Value Date/Time   LIPASE 17 09/24/2017 0343       Studies/Results: No results found.  Anti-infectives: Anti-infectives (From admission, onward)   Start     Dose/Rate Route Frequency Ordered Stop   10/11/17 0300  piperacillin-tazobactam (ZOSYN) IVPB 3.375 g  Status:  Discontinued     3.375 g 12.5 mL/hr over 240 Minutes Intravenous Every 8 hours 10/10/17 1851 10/16/17 1457   10/08/17 1600  piperacillin-tazobactam (ZOSYN) IVPB 3.375 g  Status:  Discontinued     3.375 g 12.5 mL/hr over 240 Minutes Intravenous Every 8 hours 10/08/17 0837 10/10/17 1851   10/08/17 0900  piperacillin-tazobactam (ZOSYN) IVPB 3.375 g     3.375 g 100 mL/hr over 30 Minutes Intravenous  Once 10/08/17 0851 10/08/17 1100       Assessment/Plan Status post subtotal cholecystectomy April 4 by Dr. [name].  Severe cholecystitis  Postop bile leak.  Failed ERCP April 17. IR drain April 18 ERCP successful at [facility] April 22 Monitoring drainage output - will need OP follow up in drain clinic Advance to soft diet Significantly deconditioned. PT and OT.  FEN: soft diet VTE: SCDs, lovenox ID:  Zosyn 4/16>4/24  Plan: PT/OT to see. Advance diet. Will likely be ready for discharge later today or tomorrow AM.    LOS: 9 days    Brigid Re , [facility] 10/17/2017, 8:10 AM Pager: [phone] Consults: [phone] Mon-Fri 7:00 am-4:30 pm Sat-Sun 7:00 am-11:30 am

## 2017-10-17 NOTE — Progress Notes (Signed)
Referring Physician(s): Dr Leane Para  Supervising Physician: Arne Cleveland  Patient Status:  [facility] - In-pt  Chief Complaint:  Biloma drain placed 4/18  Subjective:  Status post subtotal cholecystectomy April 4 by Dr. [name]. Severe cholecystitis  Postop bile leak. Failed ERCP April 17. IR drain April 18 [facility]  Better daily OP significant from biloma drain--- but slowing  Allergies: Toprol xl [metoprolol tartrate]; Lipitor [atorvastatin]; Coumadin [warfarin sodium]; Tape; and Warfarin  Medications: Prior to Admission medications   Medication Sig Start Date End Date Taking? Authorizing Provider  allopurinol (ZYLOPRIM) 300 MG tablet Take 300 mg by mouth daily.   Yes [provider]  amLODipine (NORVASC) 10 MG tablet Take 10 mg by mouth daily. 10/02/17  Yes [provider]  aspirin EC 81 MG tablet Take 81 mg by mouth daily.   Yes [provider]  brimonidine (ALPHAGAN) 0.15 % ophthalmic solution Place 1 drop into both eyes 2 (two) times daily.   Yes [provider]  Cholecalciferol (VITAMIN D3) 5000 UNITS TABS Take 5,000 Units by mouth daily.   Yes [provider]  clopidogrel (PLAVIX) 75 MG tablet TAKE 1 TABLET BY MOUTH ONCE DAILY FOR HEART STENTS Patient taking differently: TAKE 1 TABLET (75mg ) BY MOUTH ONCE DAILY FOR HEART STENTS 06/27/17  Yes [name], MD  dorzolamide (TRUSOPT) 2 % ophthalmic solution Place 1 drop into the right eye 3 (three) times daily.   Yes [provider]  fenofibrate micronized (LOFIBRA) 134 MG capsule TAKE 1 CAPSULE BY MOUTH ONCE DAILY 10/02/17  Yes [name], MD  furosemide (LASIX) 40 MG tablet Take 40-80 mg by mouth daily. 80mg  in the morning and 40mg  in the evening 08/29/17  Yes [provider]  loteprednol (LOTEMAX) 0.5 % ophthalmic suspension Place 1 drop into the right eye 2 (two) times daily.    Yes [provider]  nepafenac  (ILEVRO) 0.3 % ophthalmic suspension Place 1 drop into the right eye daily.   Yes [provider]  pravastatin (PRAVACHOL) 40 MG tablet TAKE ONE TABLET BY MOUTH AT BEDTIME FOR  CHOLESTEROL Patient taking differently: TAKE ONE TABLET (40mg ) BY MOUTH AT BEDTIME FOR  CHOLESTEROL 03/25/17  Yes [name], MD  timolol (BETIMOL) 0.5 % ophthalmic solution Place 1 drop into both eyes every morning.   Yes [provider]  traMADol (ULTRAM) 50 MG tablet Take 1 tablet (50 mg total) by mouth every 12 (twelve) hours as needed for severe pain. 10/01/17  Yes Jani Gravel, MD     Vital Signs: BP (!) 155/50 (BP Location: Right Arm)   Pulse (!) 56   Temp 97.8 F (36.6 C)   Resp 18   Ht 6\' 2"  (1.88 m)   Wt 180 lb (81.6 kg)   SpO2 97%   BMI 23.11 kg/m   Physical Exam  Musculoskeletal: Normal range of motion.  Neurological: He is alert.  Skin: Skin is warm and dry.  Site is clean and dry of IR biloma drain OP 80-100 cc daily until yesterday 45 cc total yesterday  Nursing note and vitals reviewed.   Imaging: No results found.  Labs:  CBC: Recent Labs    10/09/17 1018 10/10/17 0401 10/11/17 0538 10/16/17 0816  WBC 9.1 8.4 7.2 7.2  HGB 8.2* 8.8* 8.4* 9.3*  HCT 24.5* 27.6* 26.3* 27.9*  PLT 242 247 237 265    COAGS: Recent Labs    09/23/17 1048 10/09/17 0616  INR 1.17 1.27  BMP: Recent Labs    10/10/17 0401 10/11/17 0538 10/16/17 0816 10/17/17 0355  NA 141 140 138 140  K 4.1 3.5 3.5 3.5  CL 112* 110 105 108  CO2 20* 21* 25 23  GLUCOSE 158* 104* 95 89  BUN 37* 38* 35* 32*  CALCIUM 7.7* 7.7* 8.0* 8.0*  CREATININE 2.19* 2.17* 2.33* 2.16*  GFRNONAA 27* 27* 25* 28*  GFRAA 31* 32* 29* 32*    LIVER FUNCTION TESTS: Recent Labs    10/08/17 0325 10/10/17 0401 10/16/17 0816 10/17/17 0355  BILITOT 1.8* 1.6* 1.3* 1.1  AST 30 35 36 38  ALT 20 25 28 29   ALKPHOS 130* 134* 132* 146*  PROT 4.1* 4.2* 4.4* 4.3*  ALBUMIN 1.4* 1.4* 1.8* 1.9*     Assessment and Plan:  Flush biloma drain daily 5 cc saline Will follow up in OP IR Clinic Pt will hear from scheduler for time and date  Electronically Signed: Ginna Schuur A, PA-C 10/17/2017, 9:26 AM   I spent a total of 15 Minutes at the the patient's bedside AND on the patient's hospital floor or unit, greater than 50% of which was counseling/coordinating care for biloma drain

## 2017-10-17 NOTE — Evaluation (Signed)
Occupational Therapy Evaluation Patient Details Name: Terry Garner MRN: [id number] DOB: 23-Dec-1940 Today's Date: 10/17/2017    History of Present Illness 77yo male s/p cholecystectomy done on 09/26/17 and now found to have a bile leak. He had ECRP done on 10/09/17 and on 10/14/17, also had IR drain placed on 10/10/17. PMH AAA, OA, CAD, CKD, glaucoma (legally blind), gout, HTN, back surgery, cardiac cath, cervical surgery, lumbar laminectomy, R shoulder arthroscopy, total hip replacement L    Clinical Impression   Pt was independent prior to admission. He is eager to go home and performed remarkably well given his recent medical history and poor vision. Pt is functioning at a supervision level and has all necessary equipment as well as his wife available at home. Will follow acutely, but do not anticipate any further OT needs upon discharge.    Follow Up Recommendations  No OT follow up    Equipment Recommendations  None recommended by OT    Recommendations for Other Services       Precautions / Restrictions Precautions Precautions: Other (comment) Precaution Comments: poor vision, abdominal drains  Restrictions Weight Bearing Restrictions: No      Mobility Bed Mobility Overal bed mobility: Modified Independent             General bed mobility comments: HOB up, used rail  Transfers Overall transfer level: Needs assistance Equipment used: None Transfers: Sit to/from Stand Sit to Stand: Supervision         General transfer comment: S for safety     Balance Overall balance assessment: Needs assistance Sitting-balance support: Feet supported Sitting balance-Leahy Scale: Good     Standing balance support: Bilateral upper extremity supported;During functional activity Standing balance-Leahy Scale: Fair Standing balance comment: reliant on UE support for dynamic balance                           ADL either performed or assessed with clinical judgement    ADL Overall ADL's : Needs assistance/impaired Eating/Feeding: Independent;Bed level   Grooming: Supervision/safety;Standing   Upper Body Bathing: Set up;Sitting   Lower Body Bathing: Supervison/ safety;Sit to/from stand   Upper Body Dressing : Set up;Sitting   Lower Body Dressing: Supervision/safety;Sit to/from stand   Toilet Transfer: Supervision/safety;Ambulation;RW   Toileting- Clothing Manipulation and Hygiene: Supervision/safety;Sit to/from stand       Functional mobility during ADLs: Supervision/safety;Rolling walker       Vision Baseline Vision/History: Glaucoma;Legally blind Patient Visual Report: No change from baseline       Perception     Praxis      Pertinent Vitals/Pain Pain Assessment: No/denies pain Faces Pain Scale: No hurt Pain Intervention(s): Limited activity within patient's tolerance;Monitored during session     Hand Dominance Right   Extremity/Trunk Assessment Upper Extremity Assessment Upper Extremity Assessment: Overall WFL for tasks assessed   Lower Extremity Assessment Lower Extremity Assessment: Defer to PT evaluation   Cervical / Trunk Assessment Cervical / Trunk Assessment: Kyphotic   Communication Communication Communication: HOH   Cognition Arousal/Alertness: Awake/alert Behavior During Therapy: WFL for tasks assessed/performed Overall Cognitive Status: Within Functional Limits for tasks assessed                                 General Comments: mild memory deficits   General Comments  family present throughout session     Exercises     Shoulder Instructions  Home Living Family/patient expects to be discharged to:: Private residence Living Arrangements: Spouse/significant other Available Help at Discharge: Family;Available 24 hours/day Type of Home: House Home Access: Level entry     Home Layout: One level     Bathroom Shower/Tub: Occupational psychologist: Standard     Home  Equipment: Bedside commode;Grab bars - tub/shower;Walker - 4 wheels;Cane - single point;Shower seat - built in          Prior Functioning/Environment Level of Independence: Independent        Comments: does well, usually does not use assistive devices, does not drive due to vision        OT Problem List: Decreased activity tolerance;Impaired balance (sitting and/or standing)      OT Treatment/Interventions: Self-care/ADL training;DME and/or AE instruction;Patient/family education;Balance training    OT Goals(Current goals can be found in the care plan section) Acute Rehab OT Goals Patient Stated Goal: to go home  OT Goal Formulation: With patient Time For Goal Achievement: 10/31/17 Potential to Achieve Goals: Good ADL Goals Pt Will Perform Grooming: with modified independence;standing(2 activities) Pt Will Perform Lower Body Bathing: with modified independence;sit to/from stand Pt Will Perform Lower Body Dressing: with modified independence;sit to/from stand Pt Will Transfer to Toilet: with modified independence;ambulating;regular height toilet Pt Will Perform Toileting - Clothing Manipulation and hygiene: with modified independence;sit to/from stand Pt Will Perform Tub/Shower Transfer: with modified independence;rolling walker;ambulating;shower seat  OT Frequency: Min 2X/week   Barriers to D/C:            Co-evaluation   Reason for Co-Treatment: Complexity of the patient's impairments (multi-system involvement);For patient/therapist safety PT goals addressed during session: Mobility/safety with mobility;Balance;Proper use of DME        AM-PAC PT "6 Clicks" Daily Activity     Outcome Measure Help from another person eating meals?: None Help from another person taking care of personal grooming?: A Little Help from another person toileting, which includes using toliet, bedpan, or urinal?: A Little Help from another person bathing (including washing, rinsing, drying)?:  A Little Help from another person to put on and taking off regular upper body clothing?: None Help from another person to put on and taking off regular lower body clothing?: A Little 6 Click Score: 20   End of Session Equipment Utilized During Treatment: Gait belt;Rolling walker  Activity Tolerance: Patient tolerated treatment well Patient left: in chair;with call bell/phone within reach;with family/visitor present  OT Visit Diagnosis: Unsteadiness on feet (R26.81);Other abnormalities of gait and mobility (R26.89)                Time: 1001-1019 OT Time Calculation (min): 18 min Charges:  OT General Charges $OT Visit: 1 Visit OT Evaluation $OT Eval Low Complexity: 1 Low G-Codes:     2017-11-13 Nestor Lewandowsky, OTR/L Pager: [phone]  Werner Lean, Haze Boyden 2017/11/13, 10:51 AM

## 2017-10-17 NOTE — Evaluation (Signed)
Physical Therapy Evaluation Patient Details Name: Terry Garner MRN: [id number] DOB: 12/23/1940 Today's Date: 10/17/2017   History of Present Illness  77yo male s/p cholecystectomy done on 09/26/17 and now found to have a bile leak. He had ECRP done on 10/09/17 and on 10/14/17, also had IR drain placed on 10/10/17. PMH AAA, OA, CAD, CKD, glaucoma (legally blind), gout, HTN, back surgery, cardiac cath, cervical surgery, lumbar laminectomy, R shoulder arthroscopy, total hip replacement L   Clinical Impression   Patient received in bed with family present, very pleasant and willing to participate in PT today. He is able to perform functional bed mobility with Mod(I) and increased time/effort, otherwise able to perform functional transfers and gait with general S with RW today. He is legally blind but is able to navigate obstacles in the hallway. Gross unsteadiness noted with no assistive device, resolved with RW, and he was educated to use RW at home for safety. He was left up in the chair with all needs met and family present. Moving forward he will continue to benefit from skilled PT services in the acute setting as well as skilled HHPT to further address functional deficits moving forward.     Follow Up Recommendations Home health PT    Equipment Recommendations  None recommended by PT(appears to have all necessary DME )    Recommendations for Other Services       Precautions / Restrictions Precautions Precautions: Other (comment) Precaution Comments: poor vision, abdominal drains  Restrictions Weight Bearing Restrictions: No      Mobility  Bed Mobility Overal bed mobility: Modified Independent                Transfers Overall transfer level: Needs assistance Equipment used: None Transfers: Sit to/from Stand Sit to Stand: Supervision         General transfer comment: S for safety   Ambulation/Gait Ambulation/Gait assistance: Supervision Ambulation Distance (Feet): 125  Feet Assistive device: Rolling walker (2 wheeled) Gait Pattern/deviations: Step-through pattern;Trunk flexed     General Gait Details: able to walk to door in room with no device but grossly unsteady, benefits from RW; able to walk approximately 168f with RW, no significant concerns for balance or falls noted with use of device today   Stairs            Wheelchair Mobility    Modified Rankin (Stroke Patients Only)       Balance Overall balance assessment: Needs assistance Sitting-balance support: Feet supported Sitting balance-Leahy Scale: Good     Standing balance support: Bilateral upper extremity supported;During functional activity Standing balance-Leahy Scale: Fair Standing balance comment: reliant on UE support                              Pertinent Vitals/Pain Pain Assessment: No/denies pain Faces Pain Scale: No hurt Pain Intervention(s): Limited activity within patient's tolerance;Monitored during session    [place] Living Arrangements: Spouse/significant other Available Help at Discharge: Family;Available 24 hours/day Type of Home: House Home Access: Level entry     Home Layout: One level Home Equipment: Bedside commode;Shower seat;Grab bars - tub/shower;Grab bars - toilet;Walker - 4 wheels;Cane - single point      Prior Function Level of Independence: Independent         Comments: does well, usually does not use assistive devices      Hand Dominance  Extremity/Trunk Assessment   Upper Extremity Assessment Upper Extremity Assessment: Defer to OT evaluation    Lower Extremity Assessment Lower Extremity Assessment: Overall WFL for tasks assessed    Cervical / Trunk Assessment Cervical / Trunk Assessment: Kyphotic  Communication   Communication: HOH  Cognition Arousal/Alertness: Awake/alert Behavior During Therapy: WFL for tasks  assessed/performed Overall Cognitive Status: Within Functional Limits for tasks assessed                                        General Comments General comments (skin integrity, edema, etc.): family present throughout session     Exercises     Assessment/Plan    PT Assessment Patient needs continued PT services  PT Problem List Decreased strength;Decreased mobility;Decreased safety awareness;Decreased coordination;Decreased balance       PT Treatment Interventions DME instruction;Therapeutic activities;Gait training;Therapeutic exercise;Patient/family education;Stair training;Balance training;Functional mobility training;Neuromuscular re-education;Manual techniques    PT Goals (Current goals can be found in the Care Plan section)  Acute Rehab PT Goals Patient Stated Goal: to go home  PT Goal Formulation: With patient/family Time For Goal Achievement: 10/31/17 Potential to Achieve Goals: Good    Frequency Min 3X/week   Barriers to discharge        Co-evaluation PT/OT/SLP Co-Evaluation/Treatment: Yes Reason for Co-Treatment: Complexity of the patient's impairments (multi-system involvement);For patient/therapist safety PT goals addressed during session: Mobility/safety with mobility;Balance;Proper use of DME         AM-PAC PT "6 Clicks" Daily Activity  Outcome Measure Difficulty turning over in bed (including adjusting bedclothes, sheets and blankets)?: A Little Difficulty moving from lying on back to sitting on the side of the bed? : A Little Difficulty sitting down on and standing up from a chair with arms (e.g., wheelchair, bedside commode, etc,.)?: A Little Help needed moving to and from a bed to chair (including a wheelchair)?: A Little Help needed walking in hospital room?: A Little Help needed climbing 3-5 steps with a railing? : A Little 6 Click Score: 18    End of Session Equipment Utilized During Treatment: Gait belt Activity Tolerance:  Patient tolerated treatment well Patient left: in chair;with call bell/phone within reach;with family/visitor present   PT Visit Diagnosis: Muscle weakness (generalized) (M62.81);History of falling (Z91.81);Unsteadiness on feet (R26.81)    Time: 1001-1019 PT Time Calculation (min) (ACUTE ONLY): 18 min   Charges:   PT Evaluation $PT Eval Moderate Complexity: 1 Mod     PT G Codes:        Deniece Ree PT, DPT, CBIS  Supplemental Physical Therapist [place]   Pager [phone]

## 2017-10-18 MED ORDER — ACETAMINOPHEN 325 MG PO TABS: 650.0000 mg | ORAL_TABLET | Freq: Four times a day (QID) | ORAL | Status: DC | PRN

## 2017-10-18 NOTE — Care Management Note (Signed)
Case Management Note  [name] MRN: [id number] Date of Birth: 21-Feb-1941  Subjective/Objective:    Admitted with post op bile leak, s/p cholecystectomy done on 09/26/17.He had ECRP done on 10/09/17 and on 10/14/17, also had IR drain placed on 10/10/17. PMH AAA, OA, CAD, CKD, glaucoma (legally blind), gout, HTN, back surgery, cardiac cath, cervical surgery, lumbar laminectomy, R shoulder arthroscopy, total hip replacement L.            PCP: [name]  Action/Plan: Transition to home with the resumption of home health services (PT,RN).   Expected Discharge Date:  10/18/17               Expected Discharge Plan:  [place]  In-House Referral:     Discharge planning Services  CM Consult  Post Acute Care Choice:  Resumption of Svcs/PTA Provider, Home Health Choice offered to:  Patient  DME Arranged:   N/A DME Agency:    N/A HH Arranged:  RN, PT [place] Agency:  [facility]  Status of Service:  Completed, signed off  If discussed at [place] of Stay Meetings, dates discussed:    Additional Comments:  Sharin Mons, RN 10/18/2017, 10:52 AM

## 2017-10-18 NOTE — Progress Notes (Signed)
Patient discharge teaching given, including activity, diet, follow-up appoints, and medications. Patient verbalized understanding of all discharge instructions. IV access was d/c'd. Vitals are stable. Skin is intact except as charted in most recent assessments. Pt to be escorted out by NT, to be driven home by family. 

## 2017-10-18 NOTE — Discharge Instructions (Signed)
Biliary Drainage Catheter Placement, Care After °This sheet gives you information about how to care for yourself after your procedure. Your health care provider may also give you more specific instructions. If you have problems or questions, contact your health care provider. °What can I expect after the procedure? °After the procedure, it is common to have: °· Pain or soreness at the catheter insertion site. °· Tiredness and sleepiness for several hours. °· Some bruising at the catheter insertion site. °· Drainage into the collection bag on the outside of your body, if you have an external drainage catheter. °? You might see bloody discharge in the bag for the first 1 or 2 days. °? Then, the discharge should turn a yellow-green color. ° °Follow these instructions at home: °Medicines °· Take over-the-counter and prescription medicines for pain, discomfort, or fever only as told by your health care provider. °· Do not take aspirin or blood thinners unless your health care provider says that you can. These can make bleeding worse. °· Do not drive or use heavy machinery while taking prescription pain medicine. °Catheter insertion site care °· Clean the catheter insertion site as told by your health care provider. °· Do not take baths, swim, or use a hot tub until your health care provider approves. °· Take showers only. Before showering, cover the catheter insertion area with a watertight covering to keep the area dry. °· Keep the skin around the catheter insertion site dry. If the area gets wet, dry the skin completely. °· Check your catheter insertion site every day for signs of infection. Check for: °? Redness, swelling, or pain. °? Fluid or blood. °? Warmth. °? Pus or a bad smell. °General instructions °· Rest for the remainder of the day. °· Do not drive, use machinery, or make legal decisions for 24 hours after your procedure. °· Resume your usual diet. Avoid alcoholic beverages for 24 hours after your  procedure. °· Keep all follow-up visits as told by your health care provider. This is important. °· Drink enough fluid to keep your urine clear or pale yellow. °Contact a health care provider if: °· Your pain gets worse after it had improved, and it is not relieved with pain medicines. °· You have any questions about caring for your drainage catheter or collection bag. °· You have any of these around your catheter insertion site or coming from it: °? Skin breakdown. °? Redness, swelling, or pain. °? Fluid or blood. °? Warmth to the touch. °? Pus or a bad smell. °Get help right away if: °· You have a fever or chills. °· Your redness, swelling, or pain at the catheter insertion site gets worse, even though you are cleaning it well. °· You have leakage of bile around the drainage catheter. °· Your drainage catheter becomes blocked or clogged. °· Your drainage catheter comes out. °This information is not intended to replace advice given to you by your health care provider. Make sure you discuss any questions you have with your health care provider. °Document Released: 01/24/2004 Document Revised: 04/30/2016 Document Reviewed: 04/30/2016 °[employer]vier Interactive Patient Education © 2017 [employer] ° °

## 2017-10-18 NOTE — Discharge Summary (Signed)
[facility] Discharge Summary   Patient ID: Terry Garner MRN: [id number] DOB/AGE: 31-Aug-1940 77 y.o.  Admit date: 10/07/2017 Discharge date: 10/18/2017  Admitting Diagnosis: Post-op bile leak   Discharge Diagnosis Patient Active Problem List   Diagnosis Date Noted  . Bile leak 10/08/2017  . Bile leak, postoperative 10/07/2017  . Acute cholecystitis   . Choledocholithiasis with acute cholecystitis 09/24/2017  . Abdominal pain 09/23/2017  . Anemia 08/19/2015  . CKD (chronic kidney disease) stage 4, GFR 15-29 ml/min (HCC) 08/19/2015  . BMI 26.0-26.9,adult 05/17/2015  . Preoperative cardiovascular examination 05/17/2015  . Abdominal aortic aneurysm ([place]) 12/02/2013  . Medication management 09/01/2013  . Essential hypertension 07/17/2013  . Prediabetes 07/17/2013  . Gout   . Vitamin D deficiency   . 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 Carotid artery disease ([place]) 01/15/2013  . Right bundle branch block 01/15/2013  . Hyperlipidemia 01/15/2013  . Herniated lumbar intervertebral disc 06/06/2012    Consultants Gastroenterology  Interventional radiology  Imaging: No results found.  Procedures Dr. [name] (09/26/17) - Laparoscopic Subtotal Cholecystectomy  Dr. Ardis Hughs (10/09/17) - ERCP IR percutaneous drain placement 10/10/17 ERCP at [facility] 10/14/17   Hospital Course:  Patient is a 77 year old male s/p subtotal cholecystectomy from 09/26/17 who was discharged 10/01/16. He was readmitted 10/07/17 with post-operative bile leak. ERCP was attempted here by Dr. Ardis Hughs but was unsuccessful due to challenging anatomy. Biloma drain placed 10/10/17 by IR and patient tolerated procedure well. Patient transferred to [facility] for ERCP 4/22 and then transferred back to [facility] afterwards. Tolerated procedure well and was transferred to the floor.  Diet was advanced as tolerated.  On 10/18/17 the patient was voiding well, tolerating diet, ambulating well, pain well controlled, vital signs  stable, incisions c/d/i and felt stable for discharge home.  Patient will follow up in our office in 2 weeks and knows to call with questions or concerns.  He will call to confirm appointment date/time.    Physical Exam: Gen:  Alert, NAD, pleasant Card:  Regular rate and rhythm, pedal pulses 2+ BL Pulm:  Normal effort, clear to auscultation bilaterally Abd: Soft, non-tender, non-distended, bowel sounds present, no HSM, incisions C/D/I; drain in R abdomen with bilious output Skin: warm and dry, no rashes  Psych: A&Ox3   Allergies as of 10/18/2017      Reactions   Toprol Xl [metoprolol Tartrate] Other (See Comments)   Bradycardia with beta blockers   Lipitor [atorvastatin]    Myalgias   Coumadin [warfarin Sodium] Rash   Tape Rash   Clear tape   Warfarin Rash      Medication List    TAKE these medications   acetaminophen 325 MG tablet Commonly known as:  TYLENOL Take 2 tablets (650 mg total) by mouth every 6 (six) hours as needed for mild pain (or temp > 100).   allopurinol 300 MG tablet Commonly known as:  ZYLOPRIM Take 300 mg by mouth daily.   amLODipine 10 MG tablet Commonly known as:  NORVASC Take 10 mg by mouth daily.   aspirin EC 81 MG tablet Take 81 mg by mouth daily.   brimonidine 0.15 % ophthalmic solution Commonly known as:  ALPHAGAN Place 1 drop into both eyes 2 (two) times daily.   clopidogrel 75 MG tablet Commonly known as:  PLAVIX TAKE 1 TABLET BY MOUTH ONCE DAILY FOR HEART STENTS What changed:  See the new instructions.   dorzolamide 2 % ophthalmic solution Commonly known as:  TRUSOPT Place 1  drop into the right eye 3 (three) times daily.   fenofibrate micronized 134 MG capsule Commonly known as:  LOFIBRA TAKE 1 CAPSULE BY MOUTH ONCE DAILY   furosemide 40 MG tablet Commonly known as:  LASIX Take 40-80 mg by mouth daily. 80mg  in the morning and 40mg  in the evening   ILEVRO 0.3 % ophthalmic suspension Generic drug:  nepafenac Place 1 drop into  the right eye daily.   LOTEMAX 0.5 % ophthalmic suspension Generic drug:  loteprednol Place 1 drop into the right eye 2 (two) times daily.   pravastatin 40 MG tablet Commonly known as:  PRAVACHOL TAKE ONE TABLET BY MOUTH AT BEDTIME FOR  CHOLESTEROL What changed:  See the new instructions.   timolol 0.5 % ophthalmic solution Commonly known as:  BETIMOL Place 1 drop into both eyes every morning.   traMADol 50 MG tablet Commonly known as:  ULTRAM Take 1 tablet (50 mg total) by mouth every 12 (twelve) hours as needed for severe pain.   Vitamin D3 5000 units Tabs Take 5,000 Units by mouth daily.        Follow-up Information    Rolm Bookbinder, MD. Schedule an appointment as soon as possible for a visit in 2 week(s).   Specialty:  General Surgery Contact information: [address] [place] [address] [phone]        Jacqulynn Cadet, MD Follow up in 2 week(s).   Specialties:  Interventional Radiology, Radiology Why:  follow up OP IR Clinic; we will call pt with time and date; call [phone] if questions Contact information: [place] [address] [phone]           Signed: Brigid Re, [facility] 10/18/2017, 7:32 AM Pager: [phone] Consults: [phone] Mon-Fri 7:00 am-4:30 pm Sat-Sun 7:00 am-11:30 am

## 2017-10-21 ENCOUNTER — Telehealth: Payer: Self-pay | Admitting: *Deleted

## 2017-10-21 ENCOUNTER — Other Ambulatory Visit: Payer: Self-pay | Admitting: General Surgery

## 2017-10-21 ENCOUNTER — Telehealth: Payer: Self-pay | Admitting: Cardiovascular Disease

## 2017-10-21 ENCOUNTER — Encounter (INDEPENDENT_AMBULATORY_CARE_PROVIDER_SITE_OTHER): Payer: Medicare Other | Admitting: Ophthalmology

## 2017-10-21 DIAGNOSIS — K9189 Other postprocedural complications and disorders of digestive system: Principal | ICD-10-CM

## 2017-10-21 DIAGNOSIS — K838 Other specified diseases of biliary tract: Secondary | ICD-10-CM

## 2017-10-21 NOTE — Telephone Encounter (Signed)
Pt has been on these medications long term since stent placement in 2010. Dr. [name] is aware as he noted in last OV 10/2016. Made Kathlee Nations with [facility] aware.

## 2017-10-21 NOTE — Telephone Encounter (Signed)
Called patient on 10/21/2017 , 11:13 AM in an attempt to reach the patient for a hospital follow up.   Admit date: 10/07/17 Discharge: 10/18/17   He DOES NOT have any questions or concerns about medications from the hospital admission. The patient's medications were reviewed over the phone, they were counseled to bring in all current medications to the hospital follow up visit.   I advised the patient to call if any questions or concerns arise about the hospital admission or medications    Home health WAS NOT  started in the hospital.  All questions were answered and a follow up appointment was made.   Prior to Admission medications   Medication Sig Start Date End Date Taking? Authorizing Provider  acetaminophen (TYLENOL) 325 MG tablet Take 2 tablets (650 mg total) by mouth every 6 (six) hours as needed for mild pain (or temp > 100). 10/18/17   Rayburn, [name] A, PA-C  allopurinol (ZYLOPRIM) 300 MG tablet Take 300 mg by mouth daily.    [provider]  amLODipine (NORVASC) 10 MG tablet Take 10 mg by mouth daily. 10/02/17   [provider]  aspirin EC 81 MG tablet Take 81 mg by mouth daily.    [provider]  brimonidine (ALPHAGAN) 0.15 % ophthalmic solution Place 1 drop into both eyes 2 (two) times daily.    [provider]  Cholecalciferol (VITAMIN D3) 5000 UNITS TABS Take 5,000 Units by mouth daily.    [provider]  clopidogrel (PLAVIX) 75 MG tablet TAKE 1 TABLET BY MOUTH ONCE DAILY FOR HEART STENTS Patient taking differently: TAKE 1 TABLET (75mg ) BY MOUTH ONCE DAILY FOR HEART STENTS 06/27/17   [name], MD  dorzolamide (TRUSOPT) 2 % ophthalmic solution Place 1 drop into the right eye 3 (three) times daily.    [provider]  fenofibrate micronized (LOFIBRA) 134 MG capsule TAKE 1 CAPSULE BY MOUTH ONCE DAILY 10/02/17   [name], MD  furosemide (LASIX) 40 MG tablet Take 40-80 mg by mouth daily. 80mg  in the morning and 40mg  in  the evening 08/29/17   [provider]  loteprednol (LOTEMAX) 0.5 % ophthalmic suspension Place 1 drop into the right eye 2 (two) times daily.     [provider]  nepafenac (ILEVRO) 0.3 % ophthalmic suspension Place 1 drop into the right eye daily.    [provider]  pravastatin (PRAVACHOL) 40 MG tablet TAKE ONE TABLET BY MOUTH AT BEDTIME FOR  CHOLESTEROL Patient taking differently: TAKE ONE TABLET (40mg ) BY MOUTH AT BEDTIME FOR  CHOLESTEROL 03/25/17   [name], MD  timolol (BETIMOL) 0.5 % ophthalmic solution Place 1 drop into both eyes every morning.    [provider]  traMADol (ULTRAM) 50 MG tablet Take 1 tablet (50 mg total) by mouth every 12 (twelve) hours as needed for severe pain. 10/01/17   Jani Gravel, MD

## 2017-10-21 NOTE — Telephone Encounter (Signed)
New Message    Kathlee Nations with [facility] is calling to confirm prescriptions that the patient is taking.   Pt c/o medication issue:  1. Name of Medication: Aspirin and Plavix  2. How are you currently taking this medication (dosage and times per day)? 81 mg of aspirin  3. Are you having a reaction (difficulty breathing--STAT)?    4. What is your medication issue? They want to confirm that the patient should be taking aspirin and plavix together.

## 2017-10-23 ENCOUNTER — Ambulatory Visit: Payer: Medicare Other | Admitting: Cardiovascular Disease

## 2017-10-23 NOTE — Progress Notes (Signed)
[place] FOLLOW UP AND CHRONIC CARE MANAGEMENT Assessment:   Diagnoses and all orders for this visit:  Encounter for Medicare annual wellness exam  Right bundle branch block Followed by cardiology - Dr. [name]  Essential hypertension Continue medication Monitor blood pressure at home; call if consistently over 130/80 Continue DASH diet.   Reminder to go to the ER if any CP, SOB, nausea, dizziness, severe HA, changes vision/speech, left arm numbness and tingling and jaw pain.   Bilateral carotid artery disease, unspecified type ([place]) Control blood pressure, cholesterol, glucose, increase exercise.  Managed by cardiology per patient  Abdominal aortic aneurysm (AAA) without rupture ([place]) Control blood pressure Followed by cardiology  Coronary artery disease involving coronary bypass graft of native heart with angina pectoris ([place]) Control blood pressure, cholesterol, glucose, increase exercise.  Followed by cardiology  Bile leak, postoperative Resolved; follow up with Dr. [name] as scheduled Having some post-op generalized weakness; interested in home PT but not at this time - will call back to schedule when ready  Herniated lumbar intervertebral disc S/p back surgery x 2, he reports pain is well managed   CKD (chronic kidney disease) stage 4, GFR 15-29 ml/min (HCC) Increase fluids, avoid NSAIDS, monitor sugars, will monitor Followed by Nephrology  Vitamin D deficiency At goal at recent check; continue to recommend supplementation for goal of 70-100 Defer vitamin D level  Other abnormal glucose Recent A1Cs at goal Discussed diet/exercise, weight management  Defer A1C; check CMP  Medication management CBC, CMP/GFR  Mixed hyperlipidemia Continue medications Continue low cholesterol diet and exercise.  Check lipid panel.   Idiopathic gout, unspecified chronicity, unspecified site Continue allopurinol Diet discussed Check uric acid  as needed  BMI 23.0-23.9, adult Continue to recommend diet heavy in fruits and veggies and low in animal meats, cheeses, and dairy products, appropriate calorie intake Discuss exercise recommendations routinely Continue to monitor weight at each visit  Anemia, unspecified type Check CBC, likely r/t declining renal function, followed by nephrology  Shingles Acyclovir 500 mg five times daily x 10 days Lyrica 50 mg samples x 2 weeks worth provided to be used TID PRN pain Discussed with patient if he develops eye pain or decreased vision needs to follow up sooner with ophthalmology  Addendum: patient has had extensive recent labs; defer all today; has follow up with nephrology tomorrow for recheck as well    Over 30 minutes of exam, counseling, chart review, and critical decision making was performed  Future Appointments  Date Time Provider [place]  10/29/2017 12:30 PM [facility] 1 [facility]  10/29/2017  1:00 PM [facility] DG 1 ([place]) [facility]  10/29/2017  1:00 PM [facility] IR [facility]  11/05/2017  2:00 PM [name], MD [facility] None  01/24/2018 10:00 AM [name], MD [facility] None     Plan:   During the course of the visit the patient was educated and counseled about appropriate screening and preventive services including:    Pneumococcal vaccine   Influenza vaccine  Prevnar 13  Td vaccine  Screening electrocardiogram  Colorectal cancer screening  Diabetes screening  Glaucoma screening  Nutrition counseling    Subjective:  Terry Garner is a 77 y.o. male who presents for Medicare Annual Wellness Visit, hospital follow up and 3 month follow up for HTN, hyperlipidemia, glucose management, and vitamin D Def.P atient has hx/o Gout controlled on Allopurinol. Patient has ASHD s/p PCA/Stents x 3 (2006, 2007 & 2008) Patient had a Negative  heart cath in 2010 following a false (+) Myoview. 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 Other problems include CKD4 (GFR 28 on 4/25) - followed by Dr Mercy Moore.   Post hospital follow up: He presented to the ER on 4/15 with abdominal pain s/p subtotal cholecystectomy from 09/26/17 who was discharged 10/01/16.  He was readmitted 10/07/17 with post-operative bile leak. ERCP was attempted by Dr. Ardis Hughs but was unsuccessful due to challenging anatomy. Biloma drain placed 10/10/17 by IR and patient tolerated procedure well. Patient transferred to [facility] for ERCP 4/22 and then transferred back to [facility] afterwards. Tolerated procedure well, diet progressed and discharged on 10/18/2017. Our office reached out to coordinate post-hospitalization follow up and and to review medications. No changes were made. He presents today doing fairly, reports he is tolerating food water, ensure,, feeling low on energy, and also has new rash to his right scalp - blisters with serous discharge, uncomfortable, started yesterday. He has 2 week follow up with Dr. Cristal Generous office in 2 weeks on 5/8, and follow up with urology on 5/7, and will follow up with [name] in 4-6 weeks for stent removal. He does have home health involved for dressing checks and changing.     Images while in the hospital: Ct Abdomen Wo Contrast  Result Date: 10/08/2017 CLINICAL DATA:  Biloma.  Acute generalized abdominal pain. EXAM: CT ABDOMEN WITHOUT CONTRAST TECHNIQUE: Multidetector CT imaging of the abdomen was performed following the standard protocol without IV contrast. COMPARISON:  CT scan of September 23, 2017. FINDINGS: Lower chest: Bibasilar subsegmental atelectasis or infiltrates are noted with associated pleural effusions. Hepatobiliary: Status post cholecystectomy. Large fluid collection is seen in gallbladder fossa which measures 7.6 x 6.3 cm. This fluid collection is seen to track along the lateral margin of the right hepatic lobe and appears to be subcapsular. Surgical drain is seen passing through this fluid  collection in the gallbladder fossa, although the tip appears to be outside of the fluid collection. Pancreas: Unremarkable. No pancreatic ductal dilatation or surrounding inflammatory changes. Spleen: Normal in size without focal abnormality. Mild amount of perisplenic fluid is noted. Adrenals/Urinary Tract: Adrenal glands appear normal. Bilateral nonobstructive nephrolithiasis is noted. No hydronephrosis or renal obstruction is noted. Stomach/Bowel: The stomach appears normal. There is no evidence of bowel obstruction or inflammation. Vascular/Lymphatic: 3.8 cm infrarenal abdominal aortic aneurysm is noted. Atherosclerosis of thoracic aorta is noted. No significant adenopathy is noted. Other: No abdominal wall hernia or abnormality. Musculoskeletal: No acute or significant osseous findings. IMPRESSION: Status post cholecystectomy. Large fluid collection is seen in gallbladder fossa which extends superiorly along lateral margin right hepatic lobe, and appears to be subcapsular in this area. This is most consistent with biloma. Surgical drain is seen passing through this fluid collection, although its distal tip is external to the fluid collection. Mild amount of perisplenic fluid is noted. 3.8 cm infrarenal abdominal aortic aneurysm is noted. Recommend followup by [place] in 2 years. This recommendation follows ACR consensus guidelines: White Paper of the ACR Incidental Findings Committee II on Vascular Findings. J Am Coll Radiol 2013; 10:789-794. Electronically Signed   By: Marijo Conception, M.D.   On: 10/08/2017 13:16   Nm Hepatobiliary Liver Func  Result Date: 10/07/2017 CLINICAL DATA:  Recent cholecystectomy with concern for bile leak EXAM: NUCLEAR MEDICINE HEPATOBILIARY IMAGING TECHNIQUE: Sequential images of the abdomen in anterior projection were obtained out to 60 minutes following intravenous administration of radiopharmaceutical. RADIOPHARMACEUTICALS:  5.1 mCi Tc-57m  Choletec IV COMPARISON:  None.  FINDINGS: Liver uptake of radiotracer is normal. There is prompt visualization of small bowel indicating patency of the common bile duct. Gallbladder is noted to be absent. There is radiotracer extending to the right and superiorly over the liver. It is felt that this ectopic radiotracer is evidence of a bile leak at the level of the cystic duct. IMPRESSION: Ectopic radiotracer seen in the right upper quadrant felt to represent bile leak. Gallbladder known to be absent. Prompt visualization of small bowel indicates patency of the common bile duct. These results will be called to the ordering clinician or representative by the Radiologist Assistant, and communication documented in the PACS or zVision Dashboard. Electronically Signed   By: Lowella Grip III M.D.   On: 10/07/2017 16:09    BMI is Body mass index is 21.49 kg/m., he has not been working on diet and exercise. Wt Readings from Last 3 Encounters:  10/24/17 164 lb (74.4 kg)  10/09/17 180 lb (81.6 kg)  10/01/17 198 lb 6.6 oz (90 kg)   His blood pressure has been controlled at home, today their BP is BP: (!) 128/54 He does workout. He denies chest pain, shortness of breath, dizziness.   He is on cholesterol medication (pravastatin 40 mg, fenofibrate 134 mg) and denies myalgias. His cholesterol is at goal. The cholesterol last visit was:   Lab Results  Component Value Date   CHOL 121 07/15/2017   HDL 26 (L) 07/15/2017   LDLCALC 73 07/15/2017   TRIG 132 07/15/2017   CHOLHDL 4.7 07/15/2017   He has been working on diet and exercise for glucose management, and denies foot ulcerations, increased appetite, nausea, paresthesia of the feet, polydipsia, polyuria, visual disturbances, vomiting and weight loss. Last A1C in the office was:  Lab Results  Component Value Date   HGBA1C 4.9 07/15/2017   Last GFR Lab Results  Component Value Date   GFRNONAA 28 (L) 10/17/2017   Patient is on Vitamin D supplement and at goal at recent check:     Lab Results  Component Value Date   VD25OH 94 07/15/2017     Patient is on allopurinol for gout and does report a recent flare.  Lab Results  Component Value Date   LABURIC 7.5 07/15/2017    Medication Review:   Current Outpatient Medications (Cardiovascular):  .  amLODipine (NORVASC) 10 MG tablet, Take 10 mg by mouth daily. .  fenofibrate micronized (LOFIBRA) 134 MG capsule, TAKE 1 CAPSULE BY MOUTH ONCE DAILY .  furosemide (LASIX) 40 MG tablet, Take 40-80 mg by mouth daily. 80mg  in the morning and 40mg  in the evening .  pravastatin (PRAVACHOL) 40 MG tablet, TAKE ONE TABLET BY MOUTH AT BEDTIME FOR  CHOLESTEROL (Patient taking differently: TAKE ONE TABLET (40mg ) BY MOUTH AT BEDTIME FOR  CHOLESTEROL)   Current Outpatient Medications (Analgesics):  .  acetaminophen (TYLENOL) 325 MG tablet, Take 2 tablets (650 mg total) by mouth every 6 (six) hours as needed for mild pain (or temp > 100). [name]  allopurinol (ZYLOPRIM) 300 MG tablet, Take 300 mg by mouth daily. [name]  aspirin EC 81 MG tablet, Take 81 mg by mouth daily. .  traMADol (ULTRAM) 50 MG tablet, Take 1 tablet (50 mg total) by mouth every 12 (twelve) hours as needed for severe pain. (Patient not taking: Reported on 10/24/2017)  Current Outpatient Medications (Hematological):  .  clopidogrel (PLAVIX) 75 MG tablet, TAKE 1 TABLET BY MOUTH ONCE DAILY  FOR HEART STENTS (Patient taking differently: TAKE 1 TABLET (75mg ) BY MOUTH ONCE DAILY FOR HEART STENTS)  Current Outpatient Medications (Other):  .  brimonidine (ALPHAGAN) 0.15 % ophthalmic solution, Place 1 drop into both eyes 2 (two) times daily. .  Cholecalciferol (VITAMIN D3) 5000 UNITS TABS, Take 5,000 Units by mouth daily. .  dorzolamide (TRUSOPT) 2 % ophthalmic solution, Place 1 drop into the right eye 3 (three) times daily. [name]  loteprednol (LOTEMAX) 0.5 % ophthalmic suspension, Place 1 drop into the right eye 2 (two) times daily.  .  nepafenac (ILEVRO) 0.3 % ophthalmic suspension, Place 1  drop into the right eye daily. .  timolol (BETIMOL) 0.5 % ophthalmic solution, Place 1 drop into both eyes every morning.  Allergies: Allergies  Allergen Reactions  . Toprol Xl [Metoprolol Tartrate] Other (See Comments)    Bradycardia with beta blockers  . Lipitor [Atorvastatin]     Myalgias   . Coumadin [Warfarin Sodium] Rash  . Tape Rash    Clear tape  . Warfarin Rash    Current Problems (verified) has Herniated lumbar intervertebral disc; ASCAD; Carotid artery disease ([place]); Right bundle branch block; Hyperlipidemia; Gout; Vitamin D deficiency; Essential hypertension; Other abnormal glucose; Medication management; Abdominal aortic aneurysm ([place]); BMI 23.0-23.9, adult; Anemia; CKD (chronic kidney disease) stage 4, GFR 15-29 ml/min (HCC); and Bile leak on their problem list.  Screening Tests Immunization History  Administered Date(s) Administered  . Influenza Split 04/09/2013  . Influenza, High Dose Seasonal PF 03/12/2014, 03/16/2015, 03/28/2016, 04/02/2017  . Pneumococcal Conjugate-13 03/12/2014  . Pneumococcal-Unspecified 06/25/2005  . Td 06/25/2002   Preventative care: Last colonoscopy: 2015  Prior vaccinations: TD or Tdap: 2004, Due but declines today  Influenza: 2018  Pneumococcal: 2007 Prevnar13: 2015 Shingles/Zostavax: has shingles currently  Names of Other Physician/Practitioners you currently use: 1. [facility] here for primary care 2. Dr. Zigmund Daniel, eye doctor, last visit 09/2017 - sees monthly for retinal detachment 3. Dr. Raelyn Ensign , dentist, last visit 06/2017, goes q 6 months, [facility] and [employer]  Patient Care Team: [name], MD as PCP - General (Internal Medicine) [name], MD as PCP - Cardiology (Cardiology) Marybelle Killings, MD as Consulting Physician (Orthopedic Surgery) Clarene Essex, MD as Consulting Physician (Gastroenterology) Ladene Artist, MD as Consulting Physician  (Gastroenterology) Clent Jacks, MD as Consulting Physician (Ophthalmology)  Surgical: He  has a past surgical history that includes Cardiac catheterization; Hernia repair (Right, 93); Total hip arthroplasty (Left); Shoulder arthroscopy (Right); Cervical disc surgery; CARDIAC STENTS; Lumbar laminectomy (06/06/2012); Back surgery; Eye surgery (Left, 06); Cholecystectomy (N/A, 09/26/2017); and ERCP (N/A, 10/09/2017). Family His family history includes Diabetes in his brother; Heart disease in his brother, father, mother, and sister; Other in his brother; Stroke in his father. Social history  He reports that he has never smoked. He has never used smokeless tobacco. He reports that he does not drink alcohol or use drugs.  MEDICARE WELLNESS OBJECTIVES: Physical activity: Current Exercise Habits: Home exercise routine, Type of exercise: walking;strength training/weights, Time (Minutes): 15, Frequency (Times/Week): 7, Weekly Exercise (Minutes/Week): 105, Intensity: Mild, Exercise limited by: Other - see comments(Weakenss following surgery) Cardiac risk factors: Cardiac Risk Factors include: advanced age (>6men, >40 women);hypertension;male gender;dyslipidemia Depression/mood screen:   Depression screen [facility] 2/9 10/24/2017  Decreased Interest 0  Down, Depressed, Hopeless 0  PHQ - 2 Score 0    ADLs:  In your present state of health, do you have any difficulty performing the following activities:  10/24/2017 10/08/2017  Hearing? Y -  Comment Can't afford hearing aids -  Vision? Y -  Comment Retinal detachment, poor vision even with correction -  Difficulty concentrating or making decisions? N -  Walking or climbing stairs? Y -  Comment Weak following surgery -  Dressing or bathing? N -  Doing errands, shopping? Y Y  Comment Cannot drive due to vision, wife drives -  Some recent data might be hidden     Cognitive Testing  Alert? Yes  Normal Appearance?Yes  Oriented to person? Yes  Place? Yes    Time? Yes  Recall of three objects?  Yes  Can perform simple calculations? Yes  Displays appropriate judgment?Yes  Can read the correct time from a watch face?Yes  EOL planning: Does Patient Have a Medical Advance Directive?: No Would patient like information on creating a medical advance directive?: No - Patient declined   Objective:   Today's Vitals   10/24/17 0829  BP: (!) 128/54  Pulse: 64  Temp: (!) 97.5 F (36.4 C)  SpO2: 98%  Weight: 164 lb (74.4 kg)  Height: 6' 1.25" (1.861 m)   Body mass index is 21.49 kg/m.  General appearance: alert, no distress, WD/WN, male  Skin: blisters with serous discharge, faint erythematous base of left scalp following dermatome CN1 with some CN2 crossover HEENT: normocephalic, sclerae anicteric, TMs pearly, nares patent, no discharge or erythema, pharynx normal Oral cavity: MMM, no lesions Neck: supple, no lymphadenopathy, no thyromegaly, no masses Heart: RRR, normal S1, S2, 2/6 systolic murmur Lungs: CTA bilaterally, no wheezes, rhonchi, or rales Abdomen: +bs, soft, non tender, non distended, no masses, no hepatomegaly, no splenomegaly Musculoskeletal: nontender, no swelling, no obvious deformity, walks slowly with walker Extremities: 2+ pitting edema to bilateral ankles, no cyanosis, no clubbing Pulses: 1+ symmetric, upper and lower extremities, normal cap refill Neurological: alert, oriented x 3, CN2-12 intact, generalized weakness (symmetrical), sensation normal throughout, slow gait with walker  Psychiatric: normal affect, behavior normal, pleasant   Medicare Attestation I have personally reviewed: The patient's medical and social history Their use of alcohol, tobacco or illicit drugs Their current medications and supplements The patient's functional ability including ADLs,fall risks, home safety risks, cognitive, and hearing and visual impairment Diet and physical activities Evidence for depression or mood disorders  The  patient's weight, height, BMI, and visual acuity have been recorded in the chart.  I have made referrals, counseling, and provided education to the patient based on review of the above and I have provided the patient with a written personalized care plan for preventive services.     Izora Ribas, NP   10/24/2017

## 2017-10-24 ENCOUNTER — Ambulatory Visit: Payer: Medicare Other | Admitting: Adult Health

## 2017-10-24 ENCOUNTER — Encounter: Payer: Self-pay | Admitting: Adult Health

## 2017-10-24 VITALS — BP 128/54 | HR 64 | Temp 97.5°F | Ht 73.25 in | Wt 164.0 lb

## 2017-10-24 DIAGNOSIS — I739 Peripheral vascular disease, unspecified: Secondary | ICD-10-CM

## 2017-10-24 DIAGNOSIS — D649 Anemia, unspecified: Secondary | ICD-10-CM

## 2017-10-24 DIAGNOSIS — Z79899 Other long term (current) drug therapy: Secondary | ICD-10-CM | POA: Diagnosis not present

## 2017-10-24 DIAGNOSIS — K839 Disease of biliary tract, unspecified: Secondary | ICD-10-CM

## 2017-10-24 DIAGNOSIS — E559 Vitamin D deficiency, unspecified: Secondary | ICD-10-CM | POA: Diagnosis not present

## 2017-10-24 DIAGNOSIS — I25709 Atherosclerosis of coronary artery bypass graft(s), unspecified, with unspecified angina pectoris: Secondary | ICD-10-CM

## 2017-10-24 DIAGNOSIS — I714 Abdominal aortic aneurysm, without rupture, unspecified: Secondary | ICD-10-CM

## 2017-10-24 DIAGNOSIS — Z6823 Body mass index (BMI) 23.0-23.9, adult: Secondary | ICD-10-CM

## 2017-10-24 DIAGNOSIS — R6889 Other general symptoms and signs: Secondary | ICD-10-CM

## 2017-10-24 DIAGNOSIS — E782 Mixed hyperlipidemia: Secondary | ICD-10-CM | POA: Diagnosis not present

## 2017-10-24 DIAGNOSIS — I779 Disorder of arteries and arterioles, unspecified: Secondary | ICD-10-CM | POA: Diagnosis not present

## 2017-10-24 DIAGNOSIS — M5126 Other intervertebral disc displacement, lumbar region: Secondary | ICD-10-CM

## 2017-10-24 DIAGNOSIS — I10 Essential (primary) hypertension: Secondary | ICD-10-CM | POA: Diagnosis not present

## 2017-10-24 DIAGNOSIS — M1000 Idiopathic gout, unspecified site: Secondary | ICD-10-CM

## 2017-10-24 DIAGNOSIS — N184 Chronic kidney disease, stage 4 (severe): Secondary | ICD-10-CM | POA: Diagnosis not present

## 2017-10-24 DIAGNOSIS — Z0001 Encounter for general adult medical examination with abnormal findings: Secondary | ICD-10-CM

## 2017-10-24 DIAGNOSIS — I4510 Unspecified right bundle-branch block: Secondary | ICD-10-CM

## 2017-10-24 DIAGNOSIS — B029 Zoster without complications: Secondary | ICD-10-CM | POA: Insufficient documentation

## 2017-10-24 DIAGNOSIS — R7309 Other abnormal glucose: Secondary | ICD-10-CM

## 2017-10-24 DIAGNOSIS — Z0000 Encounter for general adult medical examination without abnormal findings: Secondary | ICD-10-CM

## 2017-10-24 MED ORDER — ACYCLOVIR 400 MG PO TABS: ORAL_TABLET | ORAL | 0 refills | Status: DC

## 2017-10-24 NOTE — Patient Instructions (Signed)
Can take lyrica 50 mg 2-3 times daily as needed for pain  Acyclovir tablets or capsules What is this medicine? ACYCLOVIR (ay SYE kloe veer) is an antiviral medicine. It is used to treat or prevent infections caused by certain kinds of viruses. Examples of these infections include herpes and shingles. This medicine will not cure herpes. This medicine may be used for other purposes; ask your health care provider or pharmacist if you have questions. COMMON BRAND NAME(S): Zovirax What should I tell my health care provider before I take this medicine? They need to know if you have any of these conditions: -kidney disease -an unusual or allergic reaction to acyclovir, ganciclovir, valacyclovir, other medicines, foods, dyes, or preservatives -pregnant or trying to get pregnant -breast-feeding How should I use this medicine? Take this medicine by mouth with a glass of water. Follow the directions on the prescription label. You can take it with or without food. Take your medicine at regular intervals. Do not take your medicine more often than directed. Take all of your medicine as directed even if you think your are better. Do not skip doses or stop your medicine early. Talk to your pediatrician regarding the use of this medicine in children. While this drug may be prescribed for selected conditions, precautions do apply. Overdosage: If you think you have taken too much of this medicine contact a poison control center or emergency room at once. NOTE: This medicine is only for you. Do not share this medicine with others. What if I miss a dose? If you miss a dose, take it as soon as you can. If it is almost time for your next dose, take only that dose. Do not take double or extra doses. What may interact with this medicine? -probenecid This list may not describe all possible interactions. Give your health care provider a list of all the medicines, herbs, non-prescription drugs, or dietary supplements you  use. Also tell them if you smoke, drink alcohol, or use illegal drugs. Some items may interact with your medicine. What should I watch for while using this medicine? Tell your doctor or health care professional if your symptoms do not improve. This medicine works best when started very early in the course of an infection. Begin treatment at the first signs of infection. Drink 6 to 8 glasses of water or fluids every day while you are taking this medicine. This will help prevent side effects. You can still pass chickenpox, shingles, or herpes to another person even while you are taking this medicine. Avoid contact with others as directed. Genital herpes is a sexually transmitted disease. Talk to your doctor about how to stop the spread of infection. What side effects may I notice from receiving this medicine? Side effects that you should report to your doctor or health care professional as soon as possible: -allergic reactions like skin rash, itching or hives, swelling of the face, lips, or tongue -chest pain -confusion, hallucinations, tremor -dark urine -increased sensitivity to the sun -redness, blistering, peeling or loosening of the skin, including inside the mouth -seizures -trouble passing urine or change in the amount of urine -unusual bleeding or bruising, or pinpoint red spots on the skin -unusually weak or tired -yellowing of the eyes or skin Side effects that usually do not require medical attention (report to your doctor or health care professional if they continue or are bothersome): -diarrhea -fever -headache -nausea, vomiting -stomach upset This list may not describe all possible side effects. Call your doctor  for medical advice about side effects. You may report side effects to FDA at 1-800-FDA-1088. Where should I keep my medicine? Keep out of the reach of children. Store at room temperature between 15 and 25 degrees C (59 and 77 degrees F). Throw away any unused medicine  after the expiration date. NOTE: This sheet is a summary. It may not cover all possible information. If you have questions about this medicine, talk to your doctor, pharmacist, or health care provider.  2018 Elsevier/Gold Standard (2007-08-27 13:15:46)     Shingles Shingles, which is also known as herpes zoster, is an infection that causes a painful skin rash and fluid-filled blisters. Shingles is not related to genital herpes, which is a sexually transmitted infection. Shingles only develops in people who:  Have had chickenpox.  Have received the chickenpox vaccine. (This is rare.)  What are the causes? Shingles is caused by varicella-zoster virus (VZV). This is the same virus that causes chickenpox. After exposure to VZV, the virus stays in the body in an inactive (dormant) state. Shingles develops if the virus reactivates. This can happen many years after the initial exposure to VZV. It is not known what causes this virus to reactivate. What increases the risk? People who have had chickenpox or received the chickenpox vaccine are at risk for shingles. Infection is more common in people who:  Are older than age 46.  Have a weakened defense (immune) system, such as those with HIV, AIDS, or cancer.  Are taking medicines that weaken the immune system, such as transplant medicines.  Are under great stress.  What are the signs or symptoms? Early symptoms of this condition include itching, tingling, and pain in an area on your skin. Pain may be described as burning, stabbing, or throbbing. A few days or weeks after symptoms start, a painful red rash appears, usually on one side of the body in a bandlike or beltlike pattern. The rash eventually turns into fluid-filled blisters that break open, scab over, and dry up in about 2-3 weeks. At any time during the infection, you may also develop:  A fever.  Chills.  A headache.  An upset stomach.  How is this diagnosed? This condition  is diagnosed with a skin exam. Sometimes, skin or fluid samples are taken from the blisters before a diagnosis is made. These samples are examined under a microscope or sent to a lab for testing. How is this treated? There is no specific cure for this condition. Your health care provider will probably prescribe medicines to help you manage pain, recover more quickly, and avoid long-term problems. Medicines may include:  Antiviral drugs.  Anti-inflammatory drugs.  Pain medicines.  If the area involved is on your face, you may be referred to a specialist, such as an eye doctor (ophthalmologist) or an ear, nose, and throat (ENT) doctor to help you avoid eye problems, chronic pain, or disability. Follow these instructions at home: Medicines  Take medicines only as directed by your health care provider.  Apply an anti-itch or numbing cream to the affected area as directed by your health care provider. Blister and Rash Care  Take a cool bath or apply cool compresses to the area of the rash or blisters as directed by your health care provider. This may help with pain and itching.  Keep your rash covered with a loose bandage (dressing). Wear loose-fitting clothing to help ease the pain of material rubbing against the rash.  Keep your rash and blisters clean with  mild soap and cool water or as directed by your health care provider.  Check your rash every day for signs of infection. These include redness, swelling, and pain that lasts or increases.  Do not pick your blisters.  Do not scratch your rash. General instructions  Rest as directed by your health care provider.  Keep all follow-up visits as directed by your health care provider. This is important.  Until your blisters scab over, your infection can cause chickenpox in people who have never had it or been vaccinated against it. To prevent this from happening, avoid contact with other people, especially: ? Babies. ? Pregnant  women. ? Children who have eczema. ? Elderly people who have transplants. ? People who have chronic illnesses, such as leukemia or AIDS. Contact a health care provider if:  Your pain is not relieved with prescribed medicines.  Your pain does not get better after the rash heals.  Your rash looks infected. Signs of infection include redness, swelling, and pain that lasts or increases. Get help right away if:  The rash is on your face or nose.  You have facial pain, pain around your eye area, or loss of feeling on one side of your face.  You have ear pain or you have ringing in your ear.  You have loss of taste.  Your condition gets worse. This information is not intended to replace advice given to you by your health care provider. Make sure you discuss any questions you have with your health care provider. Document Released: 06/11/2005 Document Revised: 02/05/2016 Document Reviewed: 04/22/2014 Elsevier Interactive Patient Education  2018 [place] the below diet to avoid high fat items  High-Protein and High-Calorie Diet Eating high-protein and high-calorie foods can help you to gain weight, heal after an injury, and recover after an illness or surgery. What is my plan? The specific amount of daily protein and calories you need depends on:  Your body weight.  The reason this diet is recommended for you.  Generally, a high-protein, high-calorie diet involves:  Eating 250-500 extra calories each day.  Making sure that 10-35% of your daily calories come from protein.  Talk to your health care provider about how much protein and how many calories you need each day. Follow the diet as directed by your health care provider. What do I need to know about this diet?  Ask your health care provider if you should take a nutritional supplement.  Try to eat six small meals each day instead of three large meals.  Eat a balanced diet, including one food that is high in  protein at each meal.  Keep nutritious snacks handy, such as nuts, trail mixes, dried fruit, and yogurt.  If you have kidney disease or diabetes, eating too much protein may put extra stress on your kidneys. Talk to your health care provider if you have either of those conditions. What are some high-protein foods? Grains Quinoa. Bulgur wheat. Vegetables Soybeans. Peas. Meats and Other Protein Sources Beef, pork, and poultry. Fish and seafood. Eggs. Tofu. Textured vegetable protein (TVP). Peanut butter. Nuts and seeds. Dried beans. Protein powders. Dairy Whole milk. Whole-milk yogurt. Powdered milk. Cheese. [employer]. Eggnog. Beverages High-protein supplement drinks. Soy milk. Other Protein bars. The items listed above may not be a complete list of recommended foods or beverages. Contact your dietitian for more options. What are some high-calorie foods? Grains Pasta. Quick breads. Muffins. Pancakes. Ready-to-eat cereal. Vegetables Vegetables cooked in oil or butter.  Fried potatoes. Fruits Dried fruit. Fruit leather. Canned fruit in syrup. Fruit juice. Avocados. Meats and Other Protein Sources Peanut butter. Nuts and seeds. Dairy Heavy cream. Whipped cream. Cream cheese. Sour cream. Ice cream. Custard. Pudding. Beverages Meal-replacement beverages. Nutrition shakes. Fruit juice. Sugar-sweetened soft drinks. Condiments Salad dressing. Mayonnaise. Alfredo sauce. Fruit preserves or jelly. Honey. Syrup. Sweets/Desserts Cake. Cookies. Pie. Pastries. Candy bars. Chocolate. Fats and Oils Butter or margarine. Oil. Gravy. Other Meal-replacement bars. The items listed above may not be a complete list of recommended foods or beverages. Contact your dietitian for more options. What are some tips for including high-protein and high-calorie foods in my diet?  Add whole milk, half-and-half, or heavy cream to cereal, pudding, soup, or hot cocoa.  Add whole milk to instant breakfast  drinks.  Add peanut butter to oatmeal or smoothies.  Add powdered milk to baked goods, smoothies, or milkshakes.  Add powdered milk, cream, or butter to mashed potatoes.  Add cheese to cooked vegetables.  Make whole-milk yogurt parfaits. Top them with granola, fruit, or nuts.  Add cottage cheese to your fruit.  Add avocados, cheese, or both to sandwiches or salads.  Add meat, poultry, or seafood to rice, pasta, casseroles, salads, and soups.  Use mayonnaise when making egg salad, chicken salad, or tuna salad.  Use peanut butter as a topping for pretzels, celery, or crackers.  Add beans to casseroles, dips, and spreads.  Add pureed beans to sauces and soups.  Replace calorie-free drinks with calorie-containing drinks, such as milk and fruit juice. This information is not intended to replace advice given to you by your health care provider. Make sure you discuss any questions you have with your health care provider. Document Released: 06/11/2005 Document Revised: 11/17/2015 Document Reviewed: 11/24/2013 Elsevier Interactive Patient Education  [employer].

## 2017-10-25 DIAGNOSIS — I129 Hypertensive chronic kidney disease with stage 1 through stage 4 chronic kidney disease, or unspecified chronic kidney disease: Secondary | ICD-10-CM | POA: Diagnosis not present

## 2017-10-25 DIAGNOSIS — N184 Chronic kidney disease, stage 4 (severe): Secondary | ICD-10-CM | POA: Diagnosis not present

## 2017-10-25 DIAGNOSIS — E875 Hyperkalemia: Secondary | ICD-10-CM | POA: Diagnosis not present

## 2017-10-25 DIAGNOSIS — D631 Anemia in chronic kidney disease: Secondary | ICD-10-CM | POA: Diagnosis not present

## 2017-10-29 ENCOUNTER — Encounter: Payer: Self-pay | Admitting: Radiology

## 2017-10-29 ENCOUNTER — Ambulatory Visit
Admission: RE | Admit: 2017-10-29 | Discharge: 2017-10-29 | Disposition: A | Discharge status: Home / Self Care | Payer: Medicare Other | Source: Ambulatory Visit | Attending: Radiology | Admitting: Radiology

## 2017-10-29 ENCOUNTER — Ambulatory Visit
Admission: RE | Admit: 2017-10-29 | Discharge: 2017-10-29 | Disposition: A | Discharge status: Home / Self Care | Payer: Medicare Other | Source: Ambulatory Visit | Attending: General Surgery | Admitting: General Surgery

## 2017-10-29 DIAGNOSIS — K9189 Other postprocedural complications and disorders of digestive system: Principal | ICD-10-CM

## 2017-10-29 DIAGNOSIS — K838 Other specified diseases of biliary tract: Secondary | ICD-10-CM

## 2017-10-29 HISTORY — PX: IR RADIOLOGIST EVAL & MGMT: IMG5224

## 2017-10-29 NOTE — Progress Notes (Signed)
Patient ID: Terry Garner, male   DOB: 06/20/1941, 77 y.o.   MRN: [id number]         Chief Complaint: Post-op fluid collection  Referring Physician(s): Wakefield,Matthew  History of Present Illness: Terry Garner is a 77 y.o. male with past medical history significant for coronary and carotid artery disease, chronic kidney disease, hypertension and gout underwent a cholecystectomy complicated by development of large fluid collection within the gallbladder fossa and hepatic liver worrisome for a biloma, post percutaneous drainage catheter placement.  He presents to the interventional radiology drain clinic for evaluation and management.  Patient percutaneous drainage catheter has been maintained JP bulb.  The patient is not flushing the drainage catheter and has been diligently recording drainage catheter outputs.  Patient reports 75 cc of output on 10/24/2017, 50 cc of output on 10/25/2017, 25 cc of output on 10/26/2017, 15 cc of output on 5/5 to 2019 and only 2 cc of output on 5/6.  Patient denies recurrence of right upper abdominal pain.  No fever or chills.  Past Medical History:  Diagnosis Date  . AAA (abdominal aortic aneurysm) (HCC)    3.6 cm (01/09/12 ultrasound)  . Allergy   . Arthritis   . Bile duct leak 09/2017   post op  . Carotid artery disease ([place])   . Cataract   . Chronic kidney disease    CKD, right renal artery stenosis  . Coronary artery disease    mild left internal carotid artery stenosis  . Glaucoma   . Gout   . Heart murmur   . Hyperlipidemia   . Hypertension   . Tubular adenoma of colon 2003   Dr. Watt Climes  . Vitamin D deficiency     Past Surgical History:  Procedure Laterality Date  . BACK SURGERY    . CARDIAC CATHETERIZATION     stents  last -07  . CARDIAC STENTS    . CERVICAL DISC SURGERY    . CHOLECYSTECTOMY N/A 09/26/2017   Procedure: LAPAROSCOPIC CHOLECYSTECTOMY;  Surgeon: Rolm Bookbinder, MD;  Location: [place];  Service: General;  Laterality:  N/A;  . ERCP N/A 10/09/2017   Procedure: ENDOSCOPIC RETROGRADE CHOLANGIOPANCREATOGRAPHY (ERCP);  Surgeon: Milus Banister, MD;  Location: [facility];  Service: Endoscopy;  Laterality: N/A;  . EYE SURGERY Left 06   retinal detachment  . HERNIA REPAIR Right 93  . LUMBAR LAMINECTOMY  06/06/2012   Procedure: MICRODISCECTOMY LUMBAR LAMINECTOMY;  Surgeon: Marybelle Killings, MD;  Location: [place];  Service: Orthopedics;  Laterality: Left;  Left L4-5 Microdiscectomy  . SHOULDER ARTHROSCOPY Right   . TOTAL HIP ARTHROPLASTY Left     Allergies: Toprol xl [metoprolol tartrate]; Lipitor [atorvastatin]; Coumadin [warfarin sodium]; Tape; and Warfarin  Medications: Prior to Admission medications   Medication Sig Start Date End Date Taking? Authorizing Provider  acetaminophen (TYLENOL) 325 MG tablet Take 2 tablets (650 mg total) by mouth every 6 (six) hours as needed for mild pain (or temp > 100). 10/18/17   Rayburn, [name] A, PA-C  acyclovir (ZOVIRAX) 400 MG tablet Take 1 pill 3 times daily with food and 2 pills at night 10/24/17   Liane Comber, NP  allopurinol (ZYLOPRIM) 300 MG tablet Take 300 mg by mouth daily.    [provider]  amLODipine (NORVASC) 10 MG tablet Take 10 mg by mouth daily. 10/02/17   [provider]  aspirin EC 81 MG tablet Take 81 mg by mouth daily.    [provider]  brimonidine [facility])  0.15 % ophthalmic solution Place 1 drop into both eyes 2 (two) times daily.    [provider]  Cholecalciferol (VITAMIN D3) 5000 UNITS TABS Take 5,000 Units by mouth daily.    [provider]  clopidogrel (PLAVIX) 75 MG tablet TAKE 1 TABLET BY MOUTH ONCE DAILY FOR HEART STENTS Patient taking differently: TAKE 1 TABLET (75mg ) BY MOUTH ONCE DAILY FOR HEART STENTS 06/27/17   [name], MD  dorzolamide (TRUSOPT) 2 % ophthalmic solution Place 1 drop into the right eye 3 (three) times daily.    [provider]  fenofibrate micronized (LOFIBRA) 134  MG capsule TAKE 1 CAPSULE BY MOUTH ONCE DAILY 10/02/17   [name], MD  furosemide (LASIX) 40 MG tablet Take 40-80 mg by mouth daily. 80mg  in the morning and 40mg  in the evening 08/29/17   [provider]  loteprednol (LOTEMAX) 0.5 % ophthalmic suspension Place 1 drop into the right eye 2 (two) times daily.     [provider]  nepafenac (ILEVRO) 0.3 % ophthalmic suspension Place 1 drop into the right eye daily.    [provider]  pravastatin (PRAVACHOL) 40 MG tablet TAKE ONE TABLET BY MOUTH AT BEDTIME FOR  CHOLESTEROL Patient taking differently: TAKE ONE TABLET (40mg ) BY MOUTH AT BEDTIME FOR  CHOLESTEROL 03/25/17   [name], MD  timolol (BETIMOL) 0.5 % ophthalmic solution Place 1 drop into both eyes every morning.    [provider]  traMADol (ULTRAM) 50 MG tablet Take 1 tablet (50 mg total) by mouth every 12 (twelve) hours as needed for severe pain. Patient not taking: Reported on 10/24/2017 10/01/17   Jani Gravel, MD     Family History  Problem Relation Age of Onset  . Other Brother        laryngeal cancer  . Heart disease Brother   . Heart disease Mother   . Heart disease Father   . Stroke Father   . Heart disease Sister   . Diabetes Brother   . Colon cancer Neg Hx   . Esophageal cancer Neg Hx   . Stomach cancer Neg Hx   . Rectal cancer Neg Hx     Social History   Socioeconomic History  . Marital status: Married    Spouse name: Not on file  . Number of children: Not on file  . Years of education: Not on file  . Highest education level: Not on file  Occupational History  . Not on file  Social Needs  . Financial resource strain: Not on file  . Food insecurity:    Worry: Not on file    Inability: Not on file  . Transportation needs:    Medical: Not on file    Non-medical: Not on file  Tobacco Use  . Smoking status: Never Smoker  . Smokeless tobacco: Never Used  Substance and Sexual Activity  . Alcohol use: No  . Drug use:  No  . Sexual activity: Not on file  Lifestyle  . Physical activity:    Days per week: Not on file    Minutes per session: Not on file  . Stress: Not on file  Relationships  . Social connections:    Talks on phone: Not on file    Gets together: Not on file    Attends religious service: Not on file    Active member of club or organization: Not on file    Attends meetings of clubs or organizations: Not on file  Relationship status: Not on file  Other Topics Concern  . Not on file  Social History Narrative  . Not on file    ECOG Status: 2 - Symptomatic, <50% confined to bed  Review of Systems: A 12 point ROS discussed and pertinent positives are indicated in the HPI above.  All other systems are negative.  Review of Systems  Constitutional: Negative for activity change, fatigue and fever.  Gastrointestinal: Negative for abdominal pain.    Vital Signs: There were no vitals taken for this visit.  Physical Exam  Abdominal:    Location of percutaneous drainage catheter.    Mallampati Score:     Imaging: Ct Abdomen Wo Contrast  Result Date: 10/08/2017 CLINICAL DATA:  Biloma.  Acute generalized abdominal pain. EXAM: CT ABDOMEN WITHOUT CONTRAST TECHNIQUE: Multidetector CT imaging of the abdomen was performed following the standard protocol without IV contrast. COMPARISON:  CT scan of September 23, 2017. FINDINGS: Lower chest: Bibasilar subsegmental atelectasis or infiltrates are noted with associated pleural effusions. Hepatobiliary: Status post cholecystectomy. Large fluid collection is seen in gallbladder fossa which measures 7.6 x 6.3 cm. This fluid collection is seen to track along the lateral margin of the right hepatic lobe and appears to be subcapsular. Surgical drain is seen passing through this fluid collection in the gallbladder fossa, although the tip appears to be outside of the fluid collection. Pancreas: Unremarkable. No pancreatic ductal dilatation or surrounding  inflammatory changes. Spleen: Normal in size without focal abnormality. Mild amount of perisplenic fluid is noted. Adrenals/Urinary Tract: Adrenal glands appear normal. Bilateral nonobstructive nephrolithiasis is noted. No hydronephrosis or renal obstruction is noted. Stomach/Bowel: The stomach appears normal. There is no evidence of bowel obstruction or inflammation. Vascular/Lymphatic: 3.8 cm infrarenal abdominal aortic aneurysm is noted. Atherosclerosis of thoracic aorta is noted. No significant adenopathy is noted. Other: No abdominal wall hernia or abnormality. Musculoskeletal: No acute or significant osseous findings. IMPRESSION: Status post cholecystectomy. Large fluid collection is seen in gallbladder fossa which extends superiorly along lateral margin right hepatic lobe, and appears to be subcapsular in this area. This is most consistent with biloma. Surgical drain is seen passing through this fluid collection, although its distal tip is external to the fluid collection. Mild amount of perisplenic fluid is noted. 3.8 cm infrarenal abdominal aortic aneurysm is noted. Recommend followup by [place] in 2 years. This recommendation follows ACR consensus guidelines: White Paper of the ACR Incidental Findings Committee II on Vascular Findings. J Am Coll Radiol 2013; 10:789-794. Electronically Signed   By: Marijo Conception, M.D.   On: 10/08/2017 13:16   Nm Hepatobiliary Liver Func  Result Date: 10/07/2017 CLINICAL DATA:  Recent cholecystectomy with concern for bile leak EXAM: NUCLEAR MEDICINE HEPATOBILIARY IMAGING TECHNIQUE: Sequential images of the abdomen in anterior projection were obtained out to 60 minutes following intravenous administration of radiopharmaceutical. RADIOPHARMACEUTICALS:  5.1 mCi Tc-85m  Choletec IV COMPARISON:  None. FINDINGS: Liver uptake of radiotracer is normal. There is prompt visualization of small bowel indicating patency of the common bile duct. Gallbladder is noted to be absent. There  is radiotracer extending to the right and superiorly over the liver. It is felt that this ectopic radiotracer is evidence of a bile leak at the level of the cystic duct. IMPRESSION: Ectopic radiotracer seen in the right upper quadrant felt to represent bile leak. Gallbladder known to be absent. Prompt visualization of small bowel indicates patency of the common bile duct. These results will be called to the ordering clinician or  representative by the Radiologist Assistant, and communication documented in the PACS or zVision Dashboard. Electronically Signed   By: Lowella Grip III M.D.   On: 10/07/2017 16:09   Dg C-arm 1-60 Min-no Report  Result Date: 10/09/2017 Fluoroscopy was utilized by the requesting physician.  No radiographic interpretation.   Ct Image Guided Drainage By Percutaneous Catheter  Result Date: 10/10/2017 INDICATION: 77 year old male with biliary leak and biloma following cholecystectomy. EXAM: CT GUIDED DRAINAGE OF right upper quadrant ABSCESS MEDICATIONS: The patient is currently admitted to the hospital and receiving intravenous antibiotics. The antibiotics were administered within an appropriate time frame prior to the initiation of the procedure. ANESTHESIA/SEDATION: 1 mg IV Versed 50 mcg IV Fentanyl Moderate Sedation Time: 23 minutes The patient was continuously monitored during the procedure by the interventional radiology nurse under my direct supervision. COMPLICATIONS: None immediate. TECHNIQUE: Informed written consent was obtained from the patient after a thorough discussion of the procedural risks, benefits and alternatives. All questions were addressed. Maximal Sterile Barrier Technique was utilized including caps, mask, sterile gowns, sterile gloves, sterile drape, hand hygiene and skin antiseptic. A timeout was performed prior to the initiation of the procedure. PROCEDURE: The operative field was prepped with Chlorhexidine in a sterile fashion, and a sterile drape was  applied covering the operative field. A sterile gown and sterile gloves were used for the procedure. Local anesthesia was provided with 1% Lidocaine. A planning axial CT scan was performed. A complex fluid collection is present in the perihepatic space. A suitable skin entry site was selected and marked. Local anesthesia was attained by infiltration with 1% lidocaine. A small dermatotomy was made. Under intermittent CT imaging, an 18 gauge trocar needle was advanced into the fluid collection. There was return of frank bile. An Amplatz wire was advanced into the subdiaphragmatic space. The needle was removed. The tract was dilated to 59 [place]. A Flexima percutaneous drainage catheter was modified with several additional sideholes and advanced over the wire into the right upper quadrant fluid collection. Aspiration yields approximately 375 mL of bile. The tube was secured to the skin with 0 Prolene suture. A JP suction bulb was attached. FINDINGS: Final axial CT imaging demonstrates a well-positioned percutaneous drainage catheter with significant reduction in the volume of perihepatic fluid. IMPRESSION: Successful placement of a 14 French drainage catheter modified with additional sideholes into the perihepatic biloma. Aspiration yields approximately 375 mL bilious fluid. Signed, Criselda Peaches, MD [facility] Electronically Signed   By: Jacqulynn Cadet M.D.   On: 10/10/2017 15:11   US Abdomen Limited Ruq  Result Date: 10/07/2017 CLINICAL DATA:  77 year old male post cholecystectomy with findings on recent hepatobiliary scan raising possibility of bile leak. Subsequent encounter. EXAM: ULTRASOUND ABDOMEN LIMITED RIGHT UPPER QUADRANT COMPARISON:  10/07/2017 hepatic biliary scan. 09/25/2007 preoperative MR. FINDINGS: Gallbladder: Surgically absent. Common bile duct: Diameter: 6 mm. Liver: Complex abnormal fluid collection surrounds the liver and  within the gallbladder fossa. This is worrisome for postoperative bile leak which may be coming loculated/infected. CT recommended for further delineation. Right-sided pleural effusion and right lower lobe consolidation. This may reflect reactive changes or extension of postoperative infectious process/biloma. Portal vein is patent on color Doppler imaging with normal direction of blood flow towards the liver. IMPRESSION: Complex abnormal fluid collection surrounds the liver and within the gallbladder fossa. This is worrisome for postoperative bile leak which may be loculated/infected. CT recommended for further delineation. Right-sided pleural effusion and right lower lobe consolidation.  This may reflect reactive changes or extension of postoperative infectious process/biloma. These results will be called to the ordering clinician or representative by the Radiologist Assistant, and communication documented in the PACS or zVision Dashboard. Electronically Signed   By: Genia Del M.D.   On: 10/07/2017 19:07    Labs:  CBC: Recent Labs    10/09/17 1018 10/10/17 0401 10/11/17 0538 10/16/17 0816  WBC 9.1 8.4 7.2 7.2  HGB 8.2* 8.8* 8.4* 9.3*  HCT 24.5* 27.6* 26.3* 27.9*  PLT 242 247 237 265    COAGS: Recent Labs    09/23/17 1048 10/09/17 0616  INR 1.17 1.27    BMP: Recent Labs    10/10/17 0401 10/11/17 0538 10/16/17 0816 10/17/17 0355  NA 141 140 138 140  K 4.1 3.5 3.5 3.5  CL 112* 110 105 108  CO2 20* 21* 25 23  GLUCOSE 158* 104* 95 89  BUN 37* 38* 35* 32*  CALCIUM 7.7* 7.7* 8.0* 8.0*  CREATININE 2.19* 2.17* 2.33* 2.16*  GFRNONAA 27* 27* 25* 28*  GFRAA 31* 32* 29* 32*    LIVER FUNCTION TESTS: Recent Labs    10/08/17 0325 10/10/17 0401 10/16/17 0816 10/17/17 0355  BILITOT 1.8* 1.6* 1.3* 1.1  AST 30 35 36 38  ALT 20 25 28 29   ALKPHOS 130* 134* 132* 146*  PROT 4.1* 4.2* 4.4* 4.3*  ALBUMIN 1.4* 1.4* 1.8* 1.9*    TUMOR MARKERS: No results for input(s): AFPTM,  CEA, CA199, CHROMGRNA in the last 8760 hours.  Assessment and Plan:  Terry Garner is a 77 y.o. male with past medical history significant for coronary and carotid artery disease, chronic kidney disease, hypertension and gout underwent a cholecystectomy complicated by development of large fluid collection within the gallbladder fossa and hepatic liver worrisome for a biloma post percutaneous drainage catheter placement.    CT scan of the abdomen and pelvis performed earlier today demonstrates near complete resolution of both the perihepatic and gallbladder fossa's fluid collections.  Subsequent drainage catheter injection demonstrates opacification of the edge of the liver without communication with the adjacent biliary tree.  Above was discussed with referring surgeon, Dr. [name], and the decision was made to remove the peritoneal drainage catheter.  As such, the external portion of the catheter was cut and the drain was removed intact.  A dressing was placed.  (note, the surgically placed drainage catheter which has also had little/no out put for the past several days) will likely be removed at the patient's scheduled appointment with [facility] tomorrow.)  The patient was encouraged to maintain his appointment with Dr. [name] tomorrow morning, but may otherwise follow-up with interventional radiology drain clinic on a PRN basis.  Thank you for this interesting consult.  I greatly enjoyed meeting Terry Garner and look forward to participating in their care.  A copy of this report was sent to the requesting provider on this date.  Electronically Signed: Sandi Mariscal 10/29/2017, 1:15 PM   I spent a total of 10 Minutes in face to face in clinical consultation, greater than 50% of which was counseling/coordinating care for drainage catheter evaluation and management.

## 2017-11-04 ENCOUNTER — Telehealth: Payer: Self-pay

## 2017-11-04 NOTE — Telephone Encounter (Signed)
[facility] called in regard to patient and states that Acyclovir caused him not able to urinate. He stopped taking it and his urination has been fine since. Has a weeks worth left of meds. Please advise

## 2017-11-04 NOTE — Telephone Encounter (Signed)
Spoke with Amy from [facility]. She stated that he is no longer in any pain and the shingles have scabbed over. Should he still take the Acyclovir?

## 2017-11-05 ENCOUNTER — Encounter (INDEPENDENT_AMBULATORY_CARE_PROVIDER_SITE_OTHER): Payer: Medicare Other | Admitting: Ophthalmology

## 2017-11-05 DIAGNOSIS — I10 Essential (primary) hypertension: Secondary | ICD-10-CM | POA: Diagnosis not present

## 2017-11-05 DIAGNOSIS — H348122 Central retinal vein occlusion, left eye, stable: Secondary | ICD-10-CM | POA: Diagnosis not present

## 2017-11-05 DIAGNOSIS — H43813 Vitreous degeneration, bilateral: Secondary | ICD-10-CM

## 2017-11-05 DIAGNOSIS — H35033 Hypertensive retinopathy, bilateral: Secondary | ICD-10-CM

## 2017-11-05 DIAGNOSIS — H34811 Central retinal vein occlusion, right eye, with macular edema: Secondary | ICD-10-CM | POA: Diagnosis not present

## 2017-11-05 NOTE — Telephone Encounter (Signed)
Amy from home health aware and will let the patient know

## 2017-11-11 ENCOUNTER — Telehealth: Payer: Self-pay | Admitting: *Deleted

## 2017-11-11 MED ORDER — MIRTAZAPINE 30 MG PO TABS: ORAL_TABLET | ORAL | 3 refills | Status: DC

## 2017-11-11 NOTE — Telephone Encounter (Signed)
The [facility] nurse called and reported the patient has no appetite and is barely eating.  Per Dr Melford Aase, an RX for Remeron 30 mg 1/2 to 1 tablet daily has been sent to his pharmacy  The nurse and the spouse are aware.

## 2017-11-14 ENCOUNTER — Ambulatory Visit (HOSPITAL_COMMUNITY)
Admission: RE | Admit: 2017-11-14 | Discharge: 2017-11-14 | Disposition: A | Discharge status: Home / Self Care | Payer: Medicare Other | Source: Ambulatory Visit | Attending: Internal Medicine | Admitting: Internal Medicine

## 2017-11-14 DIAGNOSIS — E785 Hyperlipidemia, unspecified: Secondary | ICD-10-CM | POA: Insufficient documentation

## 2017-11-14 DIAGNOSIS — I10 Essential (primary) hypertension: Secondary | ICD-10-CM | POA: Insufficient documentation

## 2017-11-14 DIAGNOSIS — I701 Atherosclerosis of renal artery: Secondary | ICD-10-CM | POA: Diagnosis not present

## 2017-11-14 DIAGNOSIS — I774 Celiac artery compression syndrome: Secondary | ICD-10-CM | POA: Insufficient documentation

## 2017-11-14 DIAGNOSIS — K551 Chronic vascular disorders of intestine: Secondary | ICD-10-CM | POA: Diagnosis not present

## 2017-11-19 ENCOUNTER — Other Ambulatory Visit: Payer: Self-pay | Admitting: *Deleted

## 2017-11-19 DIAGNOSIS — I701 Atherosclerosis of renal artery: Secondary | ICD-10-CM

## 2017-11-21 DIAGNOSIS — K9189 Other postprocedural complications and disorders of digestive system: Secondary | ICD-10-CM | POA: Diagnosis not present

## 2017-11-22 ENCOUNTER — Telehealth: Payer: Self-pay | Admitting: Gastroenterology

## 2017-11-22 NOTE — Telephone Encounter (Signed)
I spoke with [facility] records and asked for ERCP report  And f/u instructions with Dr Obie Dredge on 4/22. They stated I will need to fax over the request and should receive the report this afternoon. Cover sheet faxed with all information requested. Received transmission log indicating they received the record request.

## 2017-11-22 NOTE — Telephone Encounter (Signed)
-----   Message from Milus Banister, MD sent at 11/22/2017  7:29 AM EDT ----- Esther Hardy look into this and will get back to you and Mr. Aylward.  I can't find the ERCP report through Epic.   [name], Can you call [facility], we need the ERCP report from procedure with Dr. Obie Dredge on 4/22. Also please ask what their plan is for seeing him in follow up or were they wanting him to follow up locally.  Thanks  DJ     ----- Message ----- From: Rolm Bookbinder, MD Sent: 11/21/2017  11:17 AM To: Milus Banister, MD  Dan This is patient who went to [name] to get stent placed for leak after subtotal chole.  He hasnt heard back from them about follow up and didn't have any paperwork. Do you know how to get this setup so we can organize?  Do we just need to call gi appt number? Thanks, [facility]

## 2017-11-25 ENCOUNTER — Telehealth: Payer: Self-pay | Admitting: Gastroenterology

## 2017-11-25 NOTE — Telephone Encounter (Signed)
I reviewed his ERCP with Dr. Harl Bowie at [facility] on 10/14/2017: Successful biliary cannulation, noted bile duct lead and CBD stones, treated with biliary sphincterotomy and plastic biliary stent placement.  [name] was planning on repeating ERCP there at about 1 month interval and report reads that their office will contact the patient to schedule.  [name] can you call Terry Garner to let him know that plan (repeat ERCP at [facility]) and also make sure that [name] is indeed planning to contact him about the repeat ERCP.  Thanks  [name],  FYI    THanks

## 2017-11-26 ENCOUNTER — Telehealth: Payer: Self-pay | Admitting: *Deleted

## 2017-11-26 NOTE — Telephone Encounter (Signed)
I spoke with Terry Garner,Dr Air traffic controller. She stated Dr Harl Bowie was planning to repeat ERCP at [facility]. Terry Garner notified the scheduler who will contact Terry Garner today to schedule date and time for ERCP. I spoke to patient to inform him of this information. Patient voiced understanding.

## 2017-11-26 NOTE — Telephone Encounter (Signed)
The patient's spouse Vickii Chafe called and asked when the patient should stop his Plavix, due to the upcoming appointment, to remove a stent from his abdomen.  Per Dr Melford Aase, the Plavix should be stopped 5 days prior to the procedure.  The spouse was advised and will relay the information to the provider from [facility]. The procedure is scheduled for 12/05/2017.

## 2017-12-02 ENCOUNTER — Encounter (INDEPENDENT_AMBULATORY_CARE_PROVIDER_SITE_OTHER): Payer: Medicare Other | Admitting: Ophthalmology

## 2017-12-02 DIAGNOSIS — H35033 Hypertensive retinopathy, bilateral: Secondary | ICD-10-CM

## 2017-12-02 DIAGNOSIS — H34811 Central retinal vein occlusion, right eye, with macular edema: Secondary | ICD-10-CM | POA: Diagnosis not present

## 2017-12-02 DIAGNOSIS — H43813 Vitreous degeneration, bilateral: Secondary | ICD-10-CM

## 2017-12-02 DIAGNOSIS — I10 Essential (primary) hypertension: Secondary | ICD-10-CM

## 2017-12-02 DIAGNOSIS — H348122 Central retinal vein occlusion, left eye, stable: Secondary | ICD-10-CM | POA: Diagnosis not present

## 2017-12-02 DIAGNOSIS — H34231 Retinal artery branch occlusion, right eye: Secondary | ICD-10-CM

## 2017-12-04 ENCOUNTER — Encounter: Payer: Self-pay | Admitting: Cardiovascular Disease

## 2017-12-04 ENCOUNTER — Ambulatory Visit: Payer: Medicare Other | Admitting: Cardiovascular Disease

## 2017-12-04 VITALS — BP 150/52 | HR 76 | Ht 74.0 in | Wt 172.6 lb

## 2017-12-04 DIAGNOSIS — I714 Abdominal aortic aneurysm, without rupture, unspecified: Secondary | ICD-10-CM

## 2017-12-04 DIAGNOSIS — I10 Essential (primary) hypertension: Secondary | ICD-10-CM

## 2017-12-04 DIAGNOSIS — E7800 Pure hypercholesterolemia, unspecified: Secondary | ICD-10-CM

## 2017-12-04 NOTE — Assessment & Plan Note (Signed)
History of abdominal aortic aneurysm which has grown from 3.5 to 4.5 cm.  We will recheck this on a semiannual basis.

## 2017-12-04 NOTE — Patient Instructions (Signed)
Medication Instructions: Your physician recommends that you continue on your current medications as directed. Please refer to the Current Medication list given to you today.   Testing/Procedures:  Every 6 months: Your physician has requested that you have an abdominal aorta duplex. During this test, an ultrasound is used to evaluate the aorta. Allow 30 minutes for this exam. Do not eat after midnight the day before and avoid carbonated beverages   Every year: Your physician has requested that you have a carotid duplex. This test is an ultrasound of the carotid arteries in your neck. It looks at blood flow through these arteries that supply the brain with blood. Allow one hour for this exam. There are no restrictions or special instructions.  Your physician has requested that you have a renal artery duplex. During this test, an ultrasound is used to evaluate blood flow to the kidneys. Allow one hour for this exam. Do not eat after midnight the day before and avoid carbonated beverages. Take your medications as you usually do.  Follow-Up: Your physician wants you to follow-up in: 1 year with Dr. [name]. You will receive a reminder letter in the mail two months in advance. If you don't receive a letter, please call our office to schedule the follow-up appointment.  If you need a refill on your cardiac medications before your next appointment, please call your pharmacy.

## 2017-12-04 NOTE — Assessment & Plan Note (Signed)
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

## 2017-12-04 NOTE — Progress Notes (Signed)
12/04/2017 Terry Garner   11/27/1940  [id number]  Primary Physician Terry Pinto, MD Primary Cardiologist: Terry Harp MD Terry Garner, [place]  HPI:  Terry Garner is a 77 y.o.  moderately overweight married Caucasian male father of one child formally a patient of Terry Garner. [facility]. I last saw him  10/26/2016.[name] He has a history of CAD status post LAD and diagonal branch stenting back in 2006, 2007 and 2008. He underwent cardiac catheterization by Dr. Nona Garner 06/09/09 revealing widely patent stents after a false positive Myoview. His other problems include chronic branch block, treated hypertension and hyperlipidemia. He denies chest pain or shortness of breath. He does have moderate carotid disease by duplex ultrasound. In addition, he does have moderate right renal artery stenosis as well as a small abdominal aortic aneurysm both of which were following by duplex ultrasound. He is neurologically asymptomatic on aspirin and Plavix. Since I saw him a year ago he remains completely stable. He has had an issue with his gallbladder and bile leak and has had multiple procedures for this.  In addition, his abdominal ultrasound recently performed 08/28/2017 shows an increase in his abdominal dimensions from 3.5 to 4.5 cm.  He denies chest pain or shortness of breath.     Current Meds  Medication Sig  . acetaminophen (TYLENOL) 325 MG tablet Take 2 tablets (650 mg total) by mouth every 6 (six) hours as needed for mild pain (or temp > 100).  [name] acyclovir (ZOVIRAX) 400 MG tablet Take 1 pill 3 times daily with food and 2 pills at night  . allopurinol (ZYLOPRIM) 300 MG tablet Take 300 mg by mouth daily.  [name] amLODipine (NORVASC) 10 MG tablet Take 10 mg by mouth daily.  [name] aspirin EC 81 MG tablet Take 81 mg by mouth daily.  . brimonidine (ALPHAGAN) 0.15 % ophthalmic solution Place 1 drop into both eyes 2 (two) times daily.  . Cholecalciferol (VITAMIN D3) 5000 UNITS TABS Take 5,000 Units by  mouth daily.  . clopidogrel (PLAVIX) 75 MG tablet TAKE 1 TABLET BY MOUTH ONCE DAILY FOR HEART STENTS (Patient taking differently: TAKE 1 TABLET (75mg ) BY MOUTH ONCE DAILY FOR HEART STENTS)  . dorzolamide (TRUSOPT) 2 % ophthalmic solution Place 1 drop into the right eye 3 (three) times daily.  . fenofibrate micronized (LOFIBRA) 134 MG capsule TAKE 1 CAPSULE BY MOUTH ONCE DAILY  . furosemide (LASIX) 40 MG tablet Take 40-80 mg by mouth daily. 80mg  in the morning and 40mg  in the evening  . loteprednol (LOTEMAX) 0.5 % ophthalmic suspension Place 1 drop into the right eye 2 (two) times daily.   . nepafenac (ILEVRO) 0.3 % ophthalmic suspension Place 1 drop into the right eye daily.  . pravastatin (PRAVACHOL) 40 MG tablet TAKE ONE TABLET BY MOUTH AT BEDTIME FOR  CHOLESTEROL (Patient taking differently: TAKE ONE TABLET (40mg ) BY MOUTH AT BEDTIME FOR  CHOLESTEROL)  . timolol (BETIMOL) 0.5 % ophthalmic solution Place 1 drop into both eyes every morning.     Allergies  Allergen Reactions  . Toprol Xl [Metoprolol Tartrate] Other (See Comments)    Bradycardia with beta blockers  . Lipitor [Atorvastatin]     Myalgias   . Coumadin [Warfarin Sodium] Rash  . Tape Rash    Clear tape  . Warfarin Rash    Social History   Socioeconomic History  . Marital status: Married    Spouse name: Not on file  . Number of children: Not on  file  . Years of education: Not on file  . Highest education level: Not on file  Occupational History  . Not on file  Social Needs  . Financial resource strain: Not on file  . Food insecurity:    Worry: Not on file    Inability: Not on file  . Transportation needs:    Medical: Not on file    Non-medical: Not on file  Tobacco Use  . Smoking status: Never Smoker  . Smokeless tobacco: Never Used  Substance and Sexual Activity  . Alcohol use: No  . Drug use: No  . Sexual activity: Not on file  Lifestyle  . Physical activity:    Days per week: Not on file    Minutes  per session: Not on file  . Stress: Not on file  Relationships  . Social connections:    Talks on phone: Not on file    Gets together: Not on file    Attends religious service: Not on file    Active member of club or organization: Not on file    Attends meetings of clubs or organizations: Not on file    Relationship status: Not on file  . Intimate partner violence:    Fear of current or ex partner: Not on file    Emotionally abused: Not on file    Physically abused: Not on file    Forced sexual activity: Not on file  Other Topics Concern  . Not on file  Social History Narrative  . Not on file     Review of Systems: General: negative for chills, fever, night sweats or weight changes.  Cardiovascular: negative for chest pain, dyspnea on exertion, edema, orthopnea, palpitations, paroxysmal nocturnal dyspnea or shortness of breath Dermatological: negative for rash Respiratory: negative for cough or wheezing Urologic: negative for hematuria Abdominal: negative for nausea, vomiting, diarrhea, bright red blood per rectum, melena, or hematemesis Neurologic: negative for visual changes, syncope, or dizziness All other systems reviewed and are otherwise negative except as noted above.    Blood pressure (!) 150/52, pulse 76, height 6\' 2"  (1.88 m), weight 172 lb 9.6 oz (78.3 kg), SpO2 98 %.  General appearance: alert and no distress Neck: no adenopathy, no JVD, supple, symmetrical, trachea midline, thyroid not enlarged, symmetric, no tenderness/mass/nodules and Soft bilateral carotid bruits Lungs: clear to auscultation bilaterally Heart: regular rate and rhythm, S1, S2 normal, no murmur, click, rub or gallop Abdomen: soft, non-tender; bowel sounds normal; no masses,  no organomegaly  EKG not performed today  ASSESSMENT AND PLAN:   Carotid artery disease (HCC) History of mild to moderate bilateral ICA stenosis by recent duplex ultrasound 08/28/2017 which we will follow on an annual  basis.  Right bundle branch block Chronic  Hyperlipidemia History of hyperlipidemia on statin therapy with recent lipid profile performed 07/15/2017 revealing total cholesterol 121, HDL of 26 and triglyceride level of 132.  Essential hypertension History of essential hypertension her blood pressure measured at 150/52.  He is on amlodipine.  Continue current meds at current dosing.  Abdominal aortic aneurysm (HCC) History of abdominal aortic aneurysm which has grown from 3.5 to 4.5 cm.  We will recheck this on a semiannual basis.      Terry Harp MD FACP,FACC,FAHA, [facility] 12/04/2017 10:34 AM

## 2017-12-04 NOTE — Assessment & Plan Note (Signed)
History of essential hypertension her blood pressure measured at 150/52.  He is on amlodipine.  Continue current meds at current dosing.

## 2017-12-04 NOTE — Assessment & Plan Note (Signed)
Chronic. 

## 2017-12-04 NOTE — Assessment & Plan Note (Signed)
History of hyperlipidemia on statin therapy with recent lipid profile performed 07/15/2017 revealing total cholesterol 121, HDL of 26 and triglyceride level of 132.

## 2017-12-05 DIAGNOSIS — Z4659 Encounter for fitting and adjustment of other gastrointestinal appliance and device: Secondary | ICD-10-CM | POA: Diagnosis not present

## 2017-12-05 DIAGNOSIS — Z7902 Long term (current) use of antithrombotics/antiplatelets: Secondary | ICD-10-CM | POA: Diagnosis not present

## 2017-12-05 DIAGNOSIS — I2510 Atherosclerotic heart disease of native coronary artery without angina pectoris: Secondary | ICD-10-CM | POA: Diagnosis not present

## 2017-12-05 DIAGNOSIS — Z79899 Other long term (current) drug therapy: Secondary | ICD-10-CM | POA: Diagnosis not present

## 2017-12-05 DIAGNOSIS — I4510 Unspecified right bundle-branch block: Secondary | ICD-10-CM | POA: Diagnosis not present

## 2017-12-05 DIAGNOSIS — Z9689 Presence of other specified functional implants: Secondary | ICD-10-CM | POA: Diagnosis not present

## 2017-12-05 DIAGNOSIS — I129 Hypertensive chronic kidney disease with stage 1 through stage 4 chronic kidney disease, or unspecified chronic kidney disease: Secondary | ICD-10-CM | POA: Diagnosis not present

## 2017-12-05 DIAGNOSIS — E785 Hyperlipidemia, unspecified: Secondary | ICD-10-CM | POA: Diagnosis not present

## 2017-12-05 DIAGNOSIS — K832 Perforation of bile duct: Secondary | ICD-10-CM | POA: Diagnosis not present

## 2017-12-05 DIAGNOSIS — N184 Chronic kidney disease, stage 4 (severe): Secondary | ICD-10-CM | POA: Diagnosis not present

## 2017-12-05 DIAGNOSIS — Z9049 Acquired absence of other specified parts of digestive tract: Secondary | ICD-10-CM | POA: Diagnosis not present

## 2017-12-05 DIAGNOSIS — Z955 Presence of coronary angioplasty implant and graft: Secondary | ICD-10-CM | POA: Diagnosis not present

## 2017-12-05 DIAGNOSIS — Z7982 Long term (current) use of aspirin: Secondary | ICD-10-CM | POA: Diagnosis not present

## 2017-12-05 DIAGNOSIS — Z8719 Personal history of other diseases of the digestive system: Secondary | ICD-10-CM | POA: Diagnosis not present

## 2017-12-30 ENCOUNTER — Encounter (INDEPENDENT_AMBULATORY_CARE_PROVIDER_SITE_OTHER): Payer: Medicare Other | Admitting: Ophthalmology

## 2017-12-30 DIAGNOSIS — H34813 Central retinal vein occlusion, bilateral, with macular edema: Secondary | ICD-10-CM

## 2017-12-30 DIAGNOSIS — H34231 Retinal artery branch occlusion, right eye: Secondary | ICD-10-CM | POA: Diagnosis not present

## 2017-12-30 DIAGNOSIS — H35371 Puckering of macula, right eye: Secondary | ICD-10-CM | POA: Diagnosis not present

## 2017-12-30 DIAGNOSIS — H35033 Hypertensive retinopathy, bilateral: Secondary | ICD-10-CM | POA: Diagnosis not present

## 2017-12-30 DIAGNOSIS — I10 Essential (primary) hypertension: Secondary | ICD-10-CM

## 2017-12-30 DIAGNOSIS — H43813 Vitreous degeneration, bilateral: Secondary | ICD-10-CM

## 2018-01-02 ENCOUNTER — Telehealth: Payer: Self-pay | Admitting: *Deleted

## 2018-01-02 ENCOUNTER — Other Ambulatory Visit: Payer: Self-pay | Admitting: *Deleted

## 2018-01-02 NOTE — Telephone Encounter (Signed)
A call was made to the patient in regard to medication reconciliation following his hospital visit from 12/05/2017.  The patient's spouse, who administers the patient's medication, reviewed the patient's current medication list. All changes were made in the Epic systems that were required.  The spouse was reminded of the patient's upcoming appointment at our office on 01/24/2018.

## 2018-01-20 DIAGNOSIS — Z131 Encounter for screening for diabetes mellitus: Secondary | ICD-10-CM | POA: Diagnosis not present

## 2018-01-20 DIAGNOSIS — E7800 Pure hypercholesterolemia, unspecified: Secondary | ICD-10-CM | POA: Diagnosis not present

## 2018-01-20 DIAGNOSIS — E559 Vitamin D deficiency, unspecified: Secondary | ICD-10-CM | POA: Diagnosis not present

## 2018-01-20 DIAGNOSIS — Z79899 Other long term (current) drug therapy: Secondary | ICD-10-CM | POA: Diagnosis not present

## 2018-01-20 DIAGNOSIS — R5383 Other fatigue: Secondary | ICD-10-CM | POA: Diagnosis not present

## 2018-01-24 ENCOUNTER — Encounter: Payer: Self-pay | Admitting: Internal Medicine

## 2018-01-27 DIAGNOSIS — E559 Vitamin D deficiency, unspecified: Secondary | ICD-10-CM | POA: Diagnosis not present

## 2018-01-27 DIAGNOSIS — N184 Chronic kidney disease, stage 4 (severe): Secondary | ICD-10-CM | POA: Diagnosis not present

## 2018-01-27 DIAGNOSIS — E7800 Pure hypercholesterolemia, unspecified: Secondary | ICD-10-CM | POA: Diagnosis not present

## 2018-01-27 DIAGNOSIS — I10 Essential (primary) hypertension: Secondary | ICD-10-CM | POA: Diagnosis not present

## 2018-01-28 ENCOUNTER — Encounter (INDEPENDENT_AMBULATORY_CARE_PROVIDER_SITE_OTHER): Payer: Medicare Other | Admitting: Ophthalmology

## 2018-01-28 DIAGNOSIS — H43813 Vitreous degeneration, bilateral: Secondary | ICD-10-CM

## 2018-01-28 DIAGNOSIS — H34811 Central retinal vein occlusion, right eye, with macular edema: Secondary | ICD-10-CM

## 2018-01-28 DIAGNOSIS — H348122 Central retinal vein occlusion, left eye, stable: Secondary | ICD-10-CM | POA: Diagnosis not present

## 2018-01-28 DIAGNOSIS — H35033 Hypertensive retinopathy, bilateral: Secondary | ICD-10-CM

## 2018-01-28 DIAGNOSIS — I10 Essential (primary) hypertension: Secondary | ICD-10-CM | POA: Diagnosis not present

## 2018-01-29 DIAGNOSIS — E875 Hyperkalemia: Secondary | ICD-10-CM | POA: Diagnosis not present

## 2018-01-29 DIAGNOSIS — I129 Hypertensive chronic kidney disease with stage 1 through stage 4 chronic kidney disease, or unspecified chronic kidney disease: Secondary | ICD-10-CM | POA: Diagnosis not present

## 2018-01-29 DIAGNOSIS — N184 Chronic kidney disease, stage 4 (severe): Secondary | ICD-10-CM | POA: Diagnosis not present

## 2018-01-29 DIAGNOSIS — D631 Anemia in chronic kidney disease: Secondary | ICD-10-CM | POA: Diagnosis not present

## 2018-02-13 DIAGNOSIS — R159 Full incontinence of feces: Secondary | ICD-10-CM | POA: Diagnosis not present

## 2018-02-13 DIAGNOSIS — R197 Diarrhea, unspecified: Secondary | ICD-10-CM | POA: Diagnosis not present

## 2018-02-13 DIAGNOSIS — Z1211 Encounter for screening for malignant neoplasm of colon: Secondary | ICD-10-CM | POA: Diagnosis not present

## 2018-02-19 DIAGNOSIS — L570 Actinic keratosis: Secondary | ICD-10-CM | POA: Diagnosis not present

## 2018-02-19 DIAGNOSIS — Z85828 Personal history of other malignant neoplasm of skin: Secondary | ICD-10-CM | POA: Diagnosis not present

## 2018-02-19 DIAGNOSIS — D225 Melanocytic nevi of trunk: Secondary | ICD-10-CM | POA: Diagnosis not present

## 2018-02-19 DIAGNOSIS — L218 Other seborrheic dermatitis: Secondary | ICD-10-CM | POA: Diagnosis not present

## 2018-02-20 DIAGNOSIS — H348312 Tributary (branch) retinal vein occlusion, right eye, stable: Secondary | ICD-10-CM | POA: Diagnosis not present

## 2018-02-20 DIAGNOSIS — H348132 Central retinal vein occlusion, bilateral, stable: Secondary | ICD-10-CM | POA: Diagnosis not present

## 2018-02-20 DIAGNOSIS — H40013 Open angle with borderline findings, low risk, bilateral: Secondary | ICD-10-CM | POA: Diagnosis not present

## 2018-02-20 DIAGNOSIS — H353231 Exudative age-related macular degeneration, bilateral, with active choroidal neovascularization: Secondary | ICD-10-CM | POA: Diagnosis not present

## 2018-02-21 DIAGNOSIS — I10 Essential (primary) hypertension: Secondary | ICD-10-CM | POA: Diagnosis not present

## 2018-02-21 DIAGNOSIS — E559 Vitamin D deficiency, unspecified: Secondary | ICD-10-CM | POA: Diagnosis not present

## 2018-02-21 DIAGNOSIS — E7800 Pure hypercholesterolemia, unspecified: Secondary | ICD-10-CM | POA: Diagnosis not present

## 2018-02-21 DIAGNOSIS — D539 Nutritional anemia, unspecified: Secondary | ICD-10-CM | POA: Diagnosis not present

## 2018-02-25 ENCOUNTER — Encounter (INDEPENDENT_AMBULATORY_CARE_PROVIDER_SITE_OTHER): Payer: Medicare Other | Admitting: Ophthalmology

## 2018-02-25 DIAGNOSIS — H34811 Central retinal vein occlusion, right eye, with macular edema: Secondary | ICD-10-CM

## 2018-02-25 DIAGNOSIS — H35033 Hypertensive retinopathy, bilateral: Secondary | ICD-10-CM

## 2018-02-25 DIAGNOSIS — I10 Essential (primary) hypertension: Secondary | ICD-10-CM

## 2018-02-25 DIAGNOSIS — H348122 Central retinal vein occlusion, left eye, stable: Secondary | ICD-10-CM | POA: Diagnosis not present

## 2018-02-25 DIAGNOSIS — H43813 Vitreous degeneration, bilateral: Secondary | ICD-10-CM

## 2018-03-05 ENCOUNTER — Other Ambulatory Visit: Payer: Self-pay | Admitting: Internal Medicine

## 2018-03-06 DIAGNOSIS — Z01818 Encounter for other preprocedural examination: Secondary | ICD-10-CM | POA: Diagnosis not present

## 2018-03-06 DIAGNOSIS — R197 Diarrhea, unspecified: Secondary | ICD-10-CM | POA: Diagnosis not present

## 2018-03-06 DIAGNOSIS — Z1211 Encounter for screening for malignant neoplasm of colon: Secondary | ICD-10-CM | POA: Diagnosis not present

## 2018-03-06 DIAGNOSIS — K635 Polyp of colon: Secondary | ICD-10-CM | POA: Diagnosis not present

## 2018-03-07 DIAGNOSIS — K529 Noninfective gastroenteritis and colitis, unspecified: Secondary | ICD-10-CM | POA: Diagnosis not present

## 2018-03-10 DIAGNOSIS — K529 Noninfective gastroenteritis and colitis, unspecified: Secondary | ICD-10-CM | POA: Diagnosis not present

## 2018-03-20 DIAGNOSIS — E7800 Pure hypercholesterolemia, unspecified: Secondary | ICD-10-CM | POA: Diagnosis not present

## 2018-03-20 DIAGNOSIS — I10 Essential (primary) hypertension: Secondary | ICD-10-CM | POA: Diagnosis not present

## 2018-03-20 DIAGNOSIS — R440 Auditory hallucinations: Secondary | ICD-10-CM | POA: Diagnosis not present

## 2018-03-20 DIAGNOSIS — E559 Vitamin D deficiency, unspecified: Secondary | ICD-10-CM | POA: Diagnosis not present

## 2018-03-21 DIAGNOSIS — K591 Functional diarrhea: Secondary | ICD-10-CM | POA: Diagnosis not present

## 2018-03-21 DIAGNOSIS — R159 Full incontinence of feces: Secondary | ICD-10-CM | POA: Diagnosis not present

## 2018-03-24 ENCOUNTER — Encounter (INDEPENDENT_AMBULATORY_CARE_PROVIDER_SITE_OTHER): Payer: Medicare Other | Admitting: Ophthalmology

## 2018-03-24 DIAGNOSIS — H43813 Vitreous degeneration, bilateral: Secondary | ICD-10-CM | POA: Diagnosis not present

## 2018-03-24 DIAGNOSIS — H34813 Central retinal vein occlusion, bilateral, with macular edema: Secondary | ICD-10-CM | POA: Diagnosis not present

## 2018-03-24 DIAGNOSIS — H35033 Hypertensive retinopathy, bilateral: Secondary | ICD-10-CM

## 2018-03-24 DIAGNOSIS — I10 Essential (primary) hypertension: Secondary | ICD-10-CM

## 2018-04-18 DIAGNOSIS — R443 Hallucinations, unspecified: Secondary | ICD-10-CM | POA: Diagnosis not present

## 2018-04-18 DIAGNOSIS — E7800 Pure hypercholesterolemia, unspecified: Secondary | ICD-10-CM | POA: Diagnosis not present

## 2018-04-18 DIAGNOSIS — E559 Vitamin D deficiency, unspecified: Secondary | ICD-10-CM | POA: Diagnosis not present

## 2018-04-18 DIAGNOSIS — I10 Essential (primary) hypertension: Secondary | ICD-10-CM | POA: Diagnosis not present

## 2018-04-18 DIAGNOSIS — R0989 Other specified symptoms and signs involving the circulatory and respiratory systems: Secondary | ICD-10-CM | POA: Diagnosis not present

## 2018-04-19 ENCOUNTER — Other Ambulatory Visit: Payer: Self-pay | Admitting: Internal Medicine

## 2018-04-19 DIAGNOSIS — R443 Hallucinations, unspecified: Secondary | ICD-10-CM

## 2018-04-23 ENCOUNTER — Encounter (INDEPENDENT_AMBULATORY_CARE_PROVIDER_SITE_OTHER): Payer: Medicare Other | Admitting: Ophthalmology

## 2018-04-23 DIAGNOSIS — H35033 Hypertensive retinopathy, bilateral: Secondary | ICD-10-CM

## 2018-04-23 DIAGNOSIS — H34813 Central retinal vein occlusion, bilateral, with macular edema: Secondary | ICD-10-CM

## 2018-04-23 DIAGNOSIS — H34231 Retinal artery branch occlusion, right eye: Secondary | ICD-10-CM

## 2018-04-23 DIAGNOSIS — I10 Essential (primary) hypertension: Secondary | ICD-10-CM | POA: Diagnosis not present

## 2018-04-23 DIAGNOSIS — H43813 Vitreous degeneration, bilateral: Secondary | ICD-10-CM

## 2018-04-24 ENCOUNTER — Ambulatory Visit
Admission: RE | Admit: 2018-04-24 | Discharge: 2018-04-24 | Disposition: A | Discharge status: Home / Self Care | Payer: Medicare Other | Source: Ambulatory Visit | Attending: Internal Medicine | Admitting: Internal Medicine

## 2018-04-24 DIAGNOSIS — R443 Hallucinations, unspecified: Secondary | ICD-10-CM

## 2018-04-24 DIAGNOSIS — R42 Dizziness and giddiness: Secondary | ICD-10-CM | POA: Diagnosis not present

## 2018-05-19 DIAGNOSIS — E7800 Pure hypercholesterolemia, unspecified: Secondary | ICD-10-CM | POA: Diagnosis not present

## 2018-05-19 DIAGNOSIS — Z79899 Other long term (current) drug therapy: Secondary | ICD-10-CM | POA: Diagnosis not present

## 2018-05-19 DIAGNOSIS — I10 Essential (primary) hypertension: Secondary | ICD-10-CM | POA: Diagnosis not present

## 2018-05-19 DIAGNOSIS — E559 Vitamin D deficiency, unspecified: Secondary | ICD-10-CM | POA: Diagnosis not present

## 2018-05-21 ENCOUNTER — Encounter (INDEPENDENT_AMBULATORY_CARE_PROVIDER_SITE_OTHER): Payer: Medicare Other | Admitting: Ophthalmology

## 2018-05-21 DIAGNOSIS — H34811 Central retinal vein occlusion, right eye, with macular edema: Secondary | ICD-10-CM | POA: Diagnosis not present

## 2018-05-21 DIAGNOSIS — I10 Essential (primary) hypertension: Secondary | ICD-10-CM

## 2018-05-21 DIAGNOSIS — H43813 Vitreous degeneration, bilateral: Secondary | ICD-10-CM

## 2018-05-21 DIAGNOSIS — H35033 Hypertensive retinopathy, bilateral: Secondary | ICD-10-CM

## 2018-05-21 DIAGNOSIS — H348122 Central retinal vein occlusion, left eye, stable: Secondary | ICD-10-CM | POA: Diagnosis not present

## 2018-05-26 DIAGNOSIS — N184 Chronic kidney disease, stage 4 (severe): Secondary | ICD-10-CM | POA: Diagnosis not present

## 2018-05-26 DIAGNOSIS — I10 Essential (primary) hypertension: Secondary | ICD-10-CM | POA: Diagnosis not present

## 2018-05-26 DIAGNOSIS — D631 Anemia in chronic kidney disease: Secondary | ICD-10-CM | POA: Diagnosis not present

## 2018-05-26 DIAGNOSIS — E7800 Pure hypercholesterolemia, unspecified: Secondary | ICD-10-CM | POA: Diagnosis not present

## 2018-06-30 DIAGNOSIS — I10 Essential (primary) hypertension: Secondary | ICD-10-CM | POA: Diagnosis not present

## 2018-06-30 DIAGNOSIS — E7800 Pure hypercholesterolemia, unspecified: Secondary | ICD-10-CM | POA: Diagnosis not present

## 2018-07-10 ENCOUNTER — Encounter (INDEPENDENT_AMBULATORY_CARE_PROVIDER_SITE_OTHER): Payer: Medicare Other | Admitting: Ophthalmology

## 2018-07-22 ENCOUNTER — Encounter (INDEPENDENT_AMBULATORY_CARE_PROVIDER_SITE_OTHER): Payer: Medicare Other | Admitting: Ophthalmology

## 2018-07-22 DIAGNOSIS — I10 Essential (primary) hypertension: Secondary | ICD-10-CM

## 2018-07-22 DIAGNOSIS — H34813 Central retinal vein occlusion, bilateral, with macular edema: Secondary | ICD-10-CM | POA: Diagnosis not present

## 2018-07-22 DIAGNOSIS — H43813 Vitreous degeneration, bilateral: Secondary | ICD-10-CM

## 2018-07-22 DIAGNOSIS — H34231 Retinal artery branch occlusion, right eye: Secondary | ICD-10-CM | POA: Diagnosis not present

## 2018-07-22 DIAGNOSIS — H35033 Hypertensive retinopathy, bilateral: Secondary | ICD-10-CM | POA: Diagnosis not present

## 2018-07-29 DIAGNOSIS — I10 Essential (primary) hypertension: Secondary | ICD-10-CM | POA: Diagnosis not present

## 2018-07-29 DIAGNOSIS — E559 Vitamin D deficiency, unspecified: Secondary | ICD-10-CM | POA: Diagnosis not present

## 2018-07-29 DIAGNOSIS — E7800 Pure hypercholesterolemia, unspecified: Secondary | ICD-10-CM | POA: Diagnosis not present

## 2018-07-29 DIAGNOSIS — N184 Chronic kidney disease, stage 4 (severe): Secondary | ICD-10-CM | POA: Diagnosis not present

## 2018-07-30 DIAGNOSIS — D631 Anemia in chronic kidney disease: Secondary | ICD-10-CM | POA: Diagnosis not present

## 2018-07-30 DIAGNOSIS — N184 Chronic kidney disease, stage 4 (severe): Secondary | ICD-10-CM | POA: Diagnosis not present

## 2018-07-30 DIAGNOSIS — I10 Essential (primary) hypertension: Secondary | ICD-10-CM | POA: Diagnosis not present

## 2018-07-30 DIAGNOSIS — Z789 Other specified health status: Secondary | ICD-10-CM | POA: Diagnosis not present

## 2018-07-31 DIAGNOSIS — E7800 Pure hypercholesterolemia, unspecified: Secondary | ICD-10-CM | POA: Diagnosis not present

## 2018-07-31 DIAGNOSIS — D631 Anemia in chronic kidney disease: Secondary | ICD-10-CM | POA: Diagnosis not present

## 2018-07-31 DIAGNOSIS — N184 Chronic kidney disease, stage 4 (severe): Secondary | ICD-10-CM | POA: Diagnosis not present

## 2018-07-31 DIAGNOSIS — I10 Essential (primary) hypertension: Secondary | ICD-10-CM | POA: Diagnosis not present

## 2018-08-14 DIAGNOSIS — I10 Essential (primary) hypertension: Secondary | ICD-10-CM | POA: Diagnosis not present

## 2018-08-14 DIAGNOSIS — D631 Anemia in chronic kidney disease: Secondary | ICD-10-CM | POA: Diagnosis not present

## 2018-08-14 DIAGNOSIS — N184 Chronic kidney disease, stage 4 (severe): Secondary | ICD-10-CM | POA: Diagnosis not present

## 2018-08-14 DIAGNOSIS — E7800 Pure hypercholesterolemia, unspecified: Secondary | ICD-10-CM | POA: Diagnosis not present

## 2018-08-18 ENCOUNTER — Encounter (INDEPENDENT_AMBULATORY_CARE_PROVIDER_SITE_OTHER): Payer: Medicare Other | Admitting: Ophthalmology

## 2018-08-18 DIAGNOSIS — H43813 Vitreous degeneration, bilateral: Secondary | ICD-10-CM

## 2018-08-18 DIAGNOSIS — H35033 Hypertensive retinopathy, bilateral: Secondary | ICD-10-CM

## 2018-08-18 DIAGNOSIS — H348122 Central retinal vein occlusion, left eye, stable: Secondary | ICD-10-CM | POA: Diagnosis not present

## 2018-08-18 DIAGNOSIS — I10 Essential (primary) hypertension: Secondary | ICD-10-CM

## 2018-08-18 DIAGNOSIS — H34811 Central retinal vein occlusion, right eye, with macular edema: Secondary | ICD-10-CM

## 2018-08-27 DIAGNOSIS — Z789 Other specified health status: Secondary | ICD-10-CM | POA: Diagnosis not present

## 2018-08-27 DIAGNOSIS — I10 Essential (primary) hypertension: Secondary | ICD-10-CM | POA: Diagnosis not present

## 2018-08-27 DIAGNOSIS — E7800 Pure hypercholesterolemia, unspecified: Secondary | ICD-10-CM | POA: Diagnosis not present

## 2018-08-29 ENCOUNTER — Ambulatory Visit (HOSPITAL_BASED_OUTPATIENT_CLINIC_OR_DEPARTMENT_OTHER)
Admission: RE | Admit: 2018-08-29 | Discharge: 2018-08-29 | Disposition: A | Discharge status: Home / Self Care | Payer: Medicare Other | Source: Ambulatory Visit | Attending: Cardiovascular Disease | Admitting: Cardiovascular Disease

## 2018-08-29 ENCOUNTER — Ambulatory Visit (HOSPITAL_COMMUNITY)
Admission: RE | Admit: 2018-08-29 | Discharge: 2018-08-29 | Disposition: A | Discharge status: Home / Self Care | Payer: Medicare Other | Source: Ambulatory Visit | Attending: Cardiovascular Disease | Admitting: Cardiovascular Disease

## 2018-08-29 DIAGNOSIS — I714 Abdominal aortic aneurysm, without rupture, unspecified: Secondary | ICD-10-CM

## 2018-08-29 DIAGNOSIS — I739 Peripheral vascular disease, unspecified: Secondary | ICD-10-CM | POA: Diagnosis not present

## 2018-08-29 DIAGNOSIS — I779 Disorder of arteries and arterioles, unspecified: Secondary | ICD-10-CM

## 2018-09-01 ENCOUNTER — Other Ambulatory Visit: Payer: Self-pay

## 2018-09-01 DIAGNOSIS — I779 Disorder of arteries and arterioles, unspecified: Secondary | ICD-10-CM

## 2018-09-01 DIAGNOSIS — I714 Abdominal aortic aneurysm, without rupture, unspecified: Secondary | ICD-10-CM

## 2018-09-01 DIAGNOSIS — I739 Peripheral vascular disease, unspecified: Principal | ICD-10-CM

## 2018-09-15 ENCOUNTER — Other Ambulatory Visit: Payer: Self-pay

## 2018-09-15 ENCOUNTER — Encounter (INDEPENDENT_AMBULATORY_CARE_PROVIDER_SITE_OTHER): Payer: Medicare Other | Admitting: Ophthalmology

## 2018-09-15 DIAGNOSIS — H43813 Vitreous degeneration, bilateral: Secondary | ICD-10-CM | POA: Diagnosis not present

## 2018-09-15 DIAGNOSIS — I10 Essential (primary) hypertension: Secondary | ICD-10-CM

## 2018-09-15 DIAGNOSIS — H34813 Central retinal vein occlusion, bilateral, with macular edema: Secondary | ICD-10-CM | POA: Diagnosis not present

## 2018-09-15 DIAGNOSIS — H35033 Hypertensive retinopathy, bilateral: Secondary | ICD-10-CM

## 2018-10-13 ENCOUNTER — Encounter (INDEPENDENT_AMBULATORY_CARE_PROVIDER_SITE_OTHER): Payer: Medicare Other | Admitting: Ophthalmology

## 2018-10-13 ENCOUNTER — Other Ambulatory Visit: Payer: Self-pay

## 2018-10-13 DIAGNOSIS — H34231 Retinal artery branch occlusion, right eye: Secondary | ICD-10-CM

## 2018-10-13 DIAGNOSIS — H34813 Central retinal vein occlusion, bilateral, with macular edema: Secondary | ICD-10-CM | POA: Diagnosis not present

## 2018-10-13 DIAGNOSIS — I10 Essential (primary) hypertension: Secondary | ICD-10-CM

## 2018-10-13 DIAGNOSIS — H35033 Hypertensive retinopathy, bilateral: Secondary | ICD-10-CM | POA: Diagnosis not present

## 2018-10-13 DIAGNOSIS — H43813 Vitreous degeneration, bilateral: Secondary | ICD-10-CM

## 2018-10-23 DIAGNOSIS — E7800 Pure hypercholesterolemia, unspecified: Secondary | ICD-10-CM | POA: Diagnosis not present

## 2018-10-23 DIAGNOSIS — E559 Vitamin D deficiency, unspecified: Secondary | ICD-10-CM | POA: Diagnosis not present

## 2018-10-23 DIAGNOSIS — I10 Essential (primary) hypertension: Secondary | ICD-10-CM | POA: Diagnosis not present

## 2018-11-03 ENCOUNTER — Ambulatory Visit: Payer: Self-pay | Admitting: Adult Health

## 2018-11-10 ENCOUNTER — Other Ambulatory Visit: Payer: Self-pay

## 2018-11-10 ENCOUNTER — Encounter (INDEPENDENT_AMBULATORY_CARE_PROVIDER_SITE_OTHER): Payer: Medicare Other | Admitting: Ophthalmology

## 2018-11-10 DIAGNOSIS — H34231 Retinal artery branch occlusion, right eye: Secondary | ICD-10-CM

## 2018-11-10 DIAGNOSIS — H35033 Hypertensive retinopathy, bilateral: Secondary | ICD-10-CM

## 2018-11-10 DIAGNOSIS — H348122 Central retinal vein occlusion, left eye, stable: Secondary | ICD-10-CM

## 2018-11-10 DIAGNOSIS — I10 Essential (primary) hypertension: Secondary | ICD-10-CM

## 2018-11-10 DIAGNOSIS — H34811 Central retinal vein occlusion, right eye, with macular edema: Secondary | ICD-10-CM | POA: Diagnosis not present

## 2018-11-10 DIAGNOSIS — H43813 Vitreous degeneration, bilateral: Secondary | ICD-10-CM

## 2018-11-24 ENCOUNTER — Telehealth: Payer: Self-pay

## 2018-11-24 NOTE — Telephone Encounter (Signed)
I spoke with patients wife.  She states that Mr. Steinhoff had a colonoscopy a year or two ago at [facility] and states the colonoscopy was normal.  I will try to obtain report for your records.  Thanks, Peter Congo

## 2018-11-24 NOTE — Telephone Encounter (Signed)
-----   Message from Ladene Artist, MD sent at 11/24/2018 10:17 AM EDT ----- Regarding: RE: Colonoscopy - Recall See his last colonoscopy at age 78. Due to history of adenomatous colon polyps we planned to consider colonoscopy at age 35. If he is in good health at age 76 it is his option to have this colonoscopy or defer due to age. Either option is a reasonable choice.  MS ----- Message ----- From: Lowell Guitar, RMA Sent: 11/24/2018   9:31 AM EDT To: Ladene Artist, MD Subject: Colonoscopy - Recall                           Dr. Fuller Plan, I spoke with patient this morning regarding a colonoscopy.  Patient states that he was told he would never have to have another colonoscopy due to his age.  He is 78 now.  Please advise. Thanks, Peter Congo

## 2018-11-24 NOTE — Telephone Encounter (Signed)
If we receive I will review the [facility] colonoscopy report.  If there was an adequately bowel prep and the exam was complete to the cecum he does not need another surveillance colonoscopy.  If he transferred his GI care to a [facility] gastroenterologist then we will defer to them for further recommendations for his GI care.

## 2018-11-25 DIAGNOSIS — D1801 Hemangioma of skin and subcutaneous tissue: Secondary | ICD-10-CM | POA: Diagnosis not present

## 2018-11-25 DIAGNOSIS — L821 Other seborrheic keratosis: Secondary | ICD-10-CM | POA: Diagnosis not present

## 2018-11-25 DIAGNOSIS — L218 Other seborrheic dermatitis: Secondary | ICD-10-CM | POA: Diagnosis not present

## 2018-11-25 DIAGNOSIS — D225 Melanocytic nevi of trunk: Secondary | ICD-10-CM | POA: Diagnosis not present

## 2018-11-27 ENCOUNTER — Other Ambulatory Visit (HOSPITAL_COMMUNITY): Payer: Self-pay | Admitting: Cardiovascular Disease

## 2018-11-27 ENCOUNTER — Ambulatory Visit (HOSPITAL_COMMUNITY)
Admission: RE | Admit: 2018-11-27 | Discharge: 2018-11-27 | Disposition: A | Discharge status: Home / Self Care | Payer: Medicare Other | Source: Ambulatory Visit | Attending: Cardiology | Admitting: Cardiology

## 2018-11-27 ENCOUNTER — Encounter: Payer: Self-pay | Admitting: Gastroenterology

## 2018-11-27 ENCOUNTER — Other Ambulatory Visit: Payer: Self-pay

## 2018-11-27 DIAGNOSIS — I701 Atherosclerosis of renal artery: Secondary | ICD-10-CM

## 2018-11-27 NOTE — Telephone Encounter (Signed)
Dr. Fuller Plan I called [facility] in [place] but they do not have any record of patient having a procedure.  Thanks, Peter Congo

## 2018-11-28 NOTE — Telephone Encounter (Signed)
Please try to get the records from his colonoscopy so we can determine if another colonoscopy is appropriate.

## 2018-11-28 NOTE — Telephone Encounter (Signed)
The pt returned call and states that he thinks his procedure was at [facility] in [place]. Peter Congo can you send the prior release request to [place] and see if they have the reports.

## 2018-11-28 NOTE — Telephone Encounter (Signed)
Left message on machine to call back  

## 2018-12-03 DIAGNOSIS — E559 Vitamin D deficiency, unspecified: Secondary | ICD-10-CM | POA: Diagnosis not present

## 2018-12-03 DIAGNOSIS — I10 Essential (primary) hypertension: Secondary | ICD-10-CM | POA: Diagnosis not present

## 2018-12-03 DIAGNOSIS — Z789 Other specified health status: Secondary | ICD-10-CM | POA: Diagnosis not present

## 2018-12-04 DIAGNOSIS — Z03818 Encounter for observation for suspected exposure to other biological agents ruled out: Secondary | ICD-10-CM | POA: Diagnosis not present

## 2018-12-08 ENCOUNTER — Encounter (INDEPENDENT_AMBULATORY_CARE_PROVIDER_SITE_OTHER): Payer: Medicare Other | Admitting: Ophthalmology

## 2018-12-08 ENCOUNTER — Other Ambulatory Visit: Payer: Self-pay

## 2018-12-08 DIAGNOSIS — H43813 Vitreous degeneration, bilateral: Secondary | ICD-10-CM | POA: Diagnosis not present

## 2018-12-08 DIAGNOSIS — I10 Essential (primary) hypertension: Secondary | ICD-10-CM

## 2018-12-08 DIAGNOSIS — H34812 Central retinal vein occlusion, left eye, with macular edema: Secondary | ICD-10-CM | POA: Diagnosis not present

## 2018-12-08 DIAGNOSIS — H353132 Nonexudative age-related macular degeneration, bilateral, intermediate dry stage: Secondary | ICD-10-CM

## 2018-12-08 DIAGNOSIS — H35033 Hypertensive retinopathy, bilateral: Secondary | ICD-10-CM | POA: Diagnosis not present

## 2018-12-12 NOTE — Telephone Encounter (Signed)
Called [facility] in [place] records and they state they will fax the report.

## 2018-12-12 NOTE — Telephone Encounter (Signed)
The colon report and path is on your desk to review.

## 2019-01-01 ENCOUNTER — Other Ambulatory Visit: Payer: Self-pay

## 2019-01-05 ENCOUNTER — Other Ambulatory Visit: Payer: Self-pay

## 2019-01-05 ENCOUNTER — Encounter (INDEPENDENT_AMBULATORY_CARE_PROVIDER_SITE_OTHER): Payer: Medicare Other | Admitting: Ophthalmology

## 2019-01-05 DIAGNOSIS — H35371 Puckering of macula, right eye: Secondary | ICD-10-CM

## 2019-01-05 DIAGNOSIS — H35033 Hypertensive retinopathy, bilateral: Secondary | ICD-10-CM

## 2019-01-05 DIAGNOSIS — I10 Essential (primary) hypertension: Secondary | ICD-10-CM | POA: Diagnosis not present

## 2019-01-05 DIAGNOSIS — H34813 Central retinal vein occlusion, bilateral, with macular edema: Secondary | ICD-10-CM

## 2019-01-05 DIAGNOSIS — H34231 Retinal artery branch occlusion, right eye: Secondary | ICD-10-CM | POA: Diagnosis not present

## 2019-01-05 DIAGNOSIS — H43813 Vitreous degeneration, bilateral: Secondary | ICD-10-CM

## 2019-01-08 ENCOUNTER — Telehealth: Payer: Self-pay | Admitting: Cardiovascular Disease

## 2019-01-08 NOTE — Telephone Encounter (Signed)
I called pt to call and confirm his appt with Dr [name] on 01-09-19.        COVID-19 Pre-Screening Questions:   In the past 7 to 10 days have you had a cough,  shortness of breath, headache, congestion, fever (100 or greater) body aches, chills, sore throat, or sudden loss of taste or sense of smell? no  Have you been around anyone with known Covid 19.  Have you been around anyone who is awaiting Covid 19 test results in the past 7 to 10 days? no  Have you been around anyone who has been exposed to Covid 19, or has mentioned symptoms of Covid 19 within the past 7 to 10 days? no  If you have any concerns/questions about symptoms patients report during screening (either on the phone or at [facility]). Contact the provider seeing the patient or DOD for further guidance.  If neither are available contact a member of the leadership team.

## 2019-01-09 ENCOUNTER — Encounter: Payer: Self-pay | Admitting: Cardiovascular Disease

## 2019-01-09 ENCOUNTER — Ambulatory Visit (INDEPENDENT_AMBULATORY_CARE_PROVIDER_SITE_OTHER): Payer: Medicare Other | Admitting: Cardiovascular Disease

## 2019-01-09 ENCOUNTER — Telehealth: Payer: Self-pay

## 2019-01-09 ENCOUNTER — Other Ambulatory Visit: Payer: Self-pay

## 2019-01-09 VITALS — BP 195/74 | HR 62 | Temp 95.0°F | Ht 74.0 in | Wt 164.8 lb

## 2019-01-09 DIAGNOSIS — I4510 Unspecified right bundle-branch block: Secondary | ICD-10-CM

## 2019-01-09 DIAGNOSIS — I10 Essential (primary) hypertension: Secondary | ICD-10-CM

## 2019-01-09 DIAGNOSIS — E782 Mixed hyperlipidemia: Secondary | ICD-10-CM

## 2019-01-09 DIAGNOSIS — I779 Disorder of arteries and arterioles, unspecified: Secondary | ICD-10-CM

## 2019-01-09 DIAGNOSIS — I714 Abdominal aortic aneurysm, without rupture, unspecified: Secondary | ICD-10-CM

## 2019-01-09 DIAGNOSIS — I739 Peripheral vascular disease, unspecified: Secondary | ICD-10-CM

## 2019-01-09 DIAGNOSIS — I25709 Atherosclerosis of coronary artery bypass graft(s), unspecified, with unspecified angina pectoris: Secondary | ICD-10-CM | POA: Diagnosis not present

## 2019-01-09 DIAGNOSIS — I701 Atherosclerosis of renal artery: Secondary | ICD-10-CM | POA: Insufficient documentation

## 2019-01-09 NOTE — Telephone Encounter (Signed)
Pt requested during appt that his wife be contacted to review 7/17 AVS. Contacted pt wife at [phone]. Reviewed AVS with pt wife and informed her that all of the info discussed is on pt AVS. She verbalized understanding

## 2019-01-09 NOTE — Assessment & Plan Note (Signed)
History of hyperlipidemia on pravastatin with lipid profile performed 07/15/2017 revealing a total cholesterol 121, LDL 73 and HDL of 26.  Going to recheck a lipid liver profile this morning.

## 2019-01-09 NOTE — Progress Notes (Signed)
01/09/2019 Terry Garner   1940/10/08  [id number]  Primary Physician Terry Mariscal, MD Primary Cardiologist: Terry Harp MD Terry Garner, [place]  HPI:  Terry Garner is a 78 y.o.  moderately overweight married Caucasian male father of one child formally a patient of Dr. Carlean Garner. Terry Garner. I last saw him  12/04/2017.[name] He has a history of CAD status post LAD and diagonal branch stenting back in 2006, 2007 and 2008. He underwent cardiac catheterization by Dr. Nona Garner 06/09/09 revealing widely patent stents after a false positive Myoview. His other problems include chronic branch block, treated hypertension and hyperlipidemia. He denies chest pain or shortness of breath. 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 Since I saw him a year ago he remains completely stable. He has had an issue with his gallbladder and bile leak and has had multiple procedures for this.  In addition, his abdominal ultrasound performed 08/28/2017 shows an increase in his abdominal dimensions from 3.5 to 4.5 cm.    Since I saw him a year ago he is remained stable.  He denies chest pain or shortness of breath.  Reviewed recent renal Dopplers performed 11/27/2018 showed a renal aortic ratio on the right of 5.79 with stable renal dimensions.  Carotid Doppler showed no evidence of ICA stenosis and abdominal ultrasound showed a stable infrarenal abdominal aortic aneurysm measuring 3.5 cm.  We will continue to follow this on annual basis.  No outpatient medications have been marked as taking for the 01/09/19 encounter (Office Visit) with Terry Harp, MD.     Allergies  Allergen Reactions  . Toprol Xl [Metoprolol Tartrate] Other (See Comments)    Bradycardia with beta blockers  . Lipitor  [Atorvastatin]     Myalgias   . Coumadin [Warfarin Sodium] Rash  . Tape Rash    Clear tape  . Warfarin Rash    Social History   Socioeconomic History  . Marital status: Married    Spouse name: Not on file  . Number of children: Not on file  . Years of education: Not on file  . Highest education level: Not on file  Occupational History  . Not on file  Social Needs  . Financial resource strain: Not on file  . Food insecurity    Worry: Not on file    Inability: Not on file  . Transportation needs    Medical: Not on file    Non-medical: Not on file  Tobacco Use  . Smoking status: Never Smoker  . Smokeless tobacco: Never Used  Substance and Sexual Activity  . Alcohol use: No  . Drug use: No  . Sexual activity: Not on file  Lifestyle  . Physical activity    Days per week: Not on file    Minutes per session: Not on file  . Stress: Not on file  Relationships  . Social Herbalist on phone: Not on file    Gets together: Not on file    Attends religious service: Not on file    Active member of club or organization: Not on file    Attends meetings of clubs or organizations: Not on file    Relationship status: Not on file  . Intimate partner violence    Fear of current or ex  partner: Not on file    Emotionally abused: Not on file    Physically abused: Not on file    Forced sexual activity: Not on file  Other Topics Concern  . Not on file  Social History Narrative  . Not on file     Review of Systems: General: negative for chills, fever, night sweats or weight changes.  Cardiovascular: negative for chest pain, dyspnea on exertion, edema, orthopnea, palpitations, paroxysmal nocturnal dyspnea or shortness of breath Dermatological: negative for rash Respiratory: negative for cough or wheezing Urologic: negative for hematuria Abdominal: negative for nausea, vomiting, diarrhea, bright red blood per rectum, melena, or hematemesis Neurologic: negative for visual  changes, syncope, or dizziness All other systems reviewed and are otherwise negative except as noted above.    Blood pressure (!) 195/74, pulse 62, temperature (!) 95 F (35 C), height 6\' 2"  (1.88 m), weight 164 lb 12.8 oz (74.8 kg), SpO2 98 %.  General appearance: alert and no distress Neck: no adenopathy, no JVD, supple, symmetrical, trachea midline, thyroid not enlarged, symmetric, no tenderness/mass/nodules and Soft bilateral carotid bruits Lungs: clear to auscultation bilaterally Heart: regular rate and rhythm, S1, S2 normal, no murmur, click, rub or gallop Extremities: extremities normal, atraumatic, no cyanosis or edema Pulses: 2+ and symmetric Skin: Skin color, texture, turgor normal. No rashes or lesions Neurologic: Alert and oriented X 3, normal strength and tone. Normal symmetric reflexes. Normal coordination and gait  EKG sinus bradycardia 54 with right bundle branch block.  I personally reviewed this EKG.  ASSESSMENT AND PLAN:   ASCAD History of CAD status post LAD diagonal branch stenting back in 2006, 7 and 8.  He underwent cardiac catheterization by Terry Garner 06/09/2009 revealing widely patent stents after false positive Myoview.  He denies chest pain or shortness of breath.  He remains on aspirin and Plavix.  Carotid artery disease (HCC) Bilateral carotid bruits with recent carotid duplex performed 08/29/2018 revealing no evidence of ICA stenosis.  Right bundle branch block Chronic  Hyperlipidemia History of hyperlipidemia on pravastatin with lipid profile performed 07/15/2017 revealing a total cholesterol 121, LDL 73 and HDL of 26.  Going to recheck a lipid liver profile this morning.  Essential hypertension History of essential hypertension blood pressure measured today at 195/74 on amlodipine.  He has not taken his blood pressure medicines this morning and he says at home his blood pressure is much better controlled.  I am going to have him keep a blood pressure log  over the next 30 days and Terry Garner will call him to review.  He may need his blood pressure medicines adjusted.  Renal artery stenosis (HCC) Recent renal Doppler studies performed 11/27/2018 revealed some progression of disease on the right side with a renal aortic ratio 5.79.  His renal dimensions have stayed stable at 9.7 on the right and 9.9 cm on the left.  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  He has a serum creatinine of 2.16 a year ago and we will recheck this.  If his blood pressures continue to rise despite the addition of antihypertensive medications he may need renal angiography and intervention.  We will recheck this in 6 months.      Terry Harp MD FACP,FACC,FAHA, [facility] 01/09/2019 8:51 AM

## 2019-01-09 NOTE — Assessment & Plan Note (Signed)
Bilateral carotid bruits with recent carotid duplex performed 08/29/2018 revealing no evidence of ICA stenosis.

## 2019-01-09 NOTE — Assessment & Plan Note (Signed)
Chronic. 

## 2019-01-09 NOTE — Assessment & Plan Note (Signed)
History of CAD status post LAD diagonal branch stenting back in 2006, 7 and 8.  He underwent cardiac catheterization by Dr. [name] 06/09/2009 revealing widely patent stents after false positive Myoview.  He denies chest pain or shortness of breath.  He remains on aspirin and Plavix.

## 2019-01-09 NOTE — Patient Instructions (Addendum)
Medication Instructions:  Your physician recommends that you continue on your current medications as directed. Please refer to the Current Medication list given to you today. If you need a refill on your cardiac medications before your next appointment, please call your pharmacy.   Lab work: Your physician recommends that you return for lab work TODAY: LIPID AND LIVER PANELS; BMET  If you have labs (blood work) drawn today and your tests are completely normal, you will receive your results only by: [name] MyChart Message (if you have MyChart) OR . A paper copy in the mail If you have any lab test that is abnormal or we need to change your treatment, we will call you to review the results.  Testing/Procedures: Your physician has requested that you have a renal artery duplex. During this test, an ultrasound is used to evaluate blood flow to the kidneys. Allow one hour for this exam. Do not eat after midnight the day before and avoid carbonated beverages. Take your medications as you usually do. SCHEDULE 6 MONTHS  Your physician has requested that you have a carotid duplex. This test is an ultrasound of the carotid arteries in your neck. It looks at blood flow through these arteries that supply the brain with blood. Allow one hour for this exam. There are no restrictions or special instructions. SCHEDULE 12 MONTHS   Your physician has requested that you have an abdominal aorta duplex. During this test, an ultrasound is used to evaluate the aorta. Allow 30 minutes for this exam. Do not eat after midnight the day before and avoid carbonated beverages. SCHEDULE 12 MONTHS   Follow-Up: At [facility], you and your health needs are our priority.  As part of our continuing mission to provide you with exceptional heart care, we have created designated Provider Care Teams.  These Care Teams include your primary Cardiologist (physician) and Advanced Practice Providers (APPs -  Physician Assistants and Nurse  Practitioners) who all work together to provide you with the care you need, when you need it. . You will need a follow up appointment in 6 months with an APP and in 12 months WITH DR. [name].  Please call our office 2 months in advance to schedule this appointment.    Any Other Special Instructions Will Be Listed Below (If Applicable). KEEP A BLOOD PRESSURE LOG FOR 30 DAYS THEN FOLLOW UP WITH A CLINICAL PHARMACIST IN THE HYPERTENSION CLINIC. YOU WILL NEED AN APPOINTMENT.

## 2019-01-09 NOTE — Assessment & Plan Note (Addendum)
History of essential hypertension blood pressure measured today at 195/74 initially and 160/72 at the end of the office visit on amlodipine.  He has not taken his blood pressure medicines this morning and he says at home his blood pressure is much better controlled.  I am going to have him keep a blood pressure log over the next 30 days and Terry Garner will call him to review.  He may need his blood pressure medicines adjusted.

## 2019-01-09 NOTE — Assessment & Plan Note (Signed)
Recent renal Doppler studies performed 11/27/2018 revealed some progression of disease on the right side with a renal aortic ratio 5.79.  His renal dimensions have stayed stable at 9.7 on the right and 9.9 cm on the left.  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  He has a serum creatinine of 2.16 a year ago and we will recheck this.  If his blood pressures continue to rise despite the addition of antihypertensive medications he may need renal angiography and intervention.  We will recheck this in 6 months.

## 2019-01-10 LAB — LIPID PANEL
Chol/HDL Ratio: 3.7 ratio (ref 0.0–5.0)
Cholesterol, Total: 108 mg/dL (ref 100–199)
HDL: 29 mg/dL — ABNORMAL LOW (ref >39)
LDL Calculated: 57 mg/dL (ref 0–99)
Triglycerides: 112 mg/dL (ref 0–149)
VLDL Cholesterol Cal: 22 mg/dL (ref 5–40)

## 2019-01-10 LAB — HEPATIC FUNCTION PANEL
ALT: 16 IU/L (ref 0–44)
AST: 42 IU/L — ABNORMAL HIGH (ref 0–40)
Albumin: 3.8 g/dL (ref 3.7–4.7)
Alkaline Phosphatase: 47 IU/L (ref 39–117)
Bilirubin Total: 0.6 mg/dL (ref 0.0–1.2)
Bilirubin, Direct: 0.32 mg/dL (ref 0.00–0.40)
Total Protein: 5.5 g/dL — ABNORMAL LOW (ref 6.0–8.5)

## 2019-01-10 LAB — BASIC METABOLIC PANEL
BUN/Creatinine Ratio: 15 (ref 10–24)
BUN: 48 mg/dL — ABNORMAL HIGH (ref 8–27)
CO2: 25 mmol/L (ref 20–29)
Calcium: 9 mg/dL (ref 8.6–10.2)
Chloride: 103 mmol/L (ref 96–106)
Creatinine, Ser: 3.24 mg/dL — ABNORMAL HIGH (ref 0.76–1.27)
GFR calc Af Amer: 20 mL/min/{1.73_m2} — ABNORMAL LOW (ref >59)
GFR calc non Af Amer: 17 mL/min/{1.73_m2} — ABNORMAL LOW (ref >59)
Glucose: 88 mg/dL (ref 65–99)
Potassium: 4.7 mmol/L (ref 3.5–5.2)
Sodium: 144 mmol/L (ref 134–144)

## 2019-01-14 ENCOUNTER — Telehealth: Payer: Medicare Other | Admitting: Cardiovascular Disease

## 2019-01-16 DIAGNOSIS — Z789 Other specified health status: Secondary | ICD-10-CM | POA: Diagnosis not present

## 2019-01-16 DIAGNOSIS — Z955 Presence of coronary angioplasty implant and graft: Secondary | ICD-10-CM | POA: Diagnosis not present

## 2019-01-16 DIAGNOSIS — I10 Essential (primary) hypertension: Secondary | ICD-10-CM | POA: Diagnosis not present

## 2019-01-19 ENCOUNTER — Other Ambulatory Visit: Payer: Self-pay | Admitting: Physician Assistant

## 2019-01-26 ENCOUNTER — Other Ambulatory Visit: Payer: Self-pay

## 2019-01-26 ENCOUNTER — Ambulatory Visit: Payer: Medicare Other | Admitting: Podiatry

## 2019-01-26 ENCOUNTER — Encounter: Payer: Self-pay | Admitting: Podiatry

## 2019-01-26 VITALS — BP 184/84 | Temp 97.9°F

## 2019-01-26 DIAGNOSIS — Z9229 Personal history of other drug therapy: Secondary | ICD-10-CM

## 2019-01-26 DIAGNOSIS — M79674 Pain in right toe(s): Secondary | ICD-10-CM

## 2019-01-26 DIAGNOSIS — Q828 Other specified congenital malformations of skin: Secondary | ICD-10-CM

## 2019-01-26 DIAGNOSIS — B351 Tinea unguium: Secondary | ICD-10-CM | POA: Diagnosis not present

## 2019-01-26 DIAGNOSIS — M79672 Pain in left foot: Secondary | ICD-10-CM | POA: Diagnosis not present

## 2019-01-26 DIAGNOSIS — M79675 Pain in left toe(s): Secondary | ICD-10-CM

## 2019-01-26 NOTE — Patient Instructions (Signed)

## 2019-01-27 NOTE — Progress Notes (Signed)
Subjective: Terry Garner presents today with  cc of painful plantar lesions on left foot. He states pain has been present for the past several weeks and has gotten to the point where he has had to wear sneakers to [place]. He has not attempted treatment. He was referred to [place] by Dr. [name] who is his Cardiologist. Pain is aggravated when weightbearing with or without shoe gear.  Pain is getting progressively worse. Patient is seeking professional help regarding symptoms.  Patient also c/o elongated, discolored, thick toenails which interfere with daily activities and routine tasks. Pain is aggravated when wearing enclosed shoe gear. Pain is getting progressively worse.   Terry Garner is accompanied by his wife on today's visit. Wife states Terry Garner has not taken his blood pressure medication for this morning. He will take it after our visit on today after he eats something.  Wife checks his blood pressure and logs it twice daily.  Past Medical History:  Diagnosis Date  . AAA (abdominal aortic aneurysm) (HCC)    3.6 cm (01/09/12 ultrasound)  . Allergy   . Arthritis   . Bile duct leak 09/2017   post op  . Carotid artery disease ([place])   . Cataract   . Chronic kidney disease    CKD, right renal artery stenosis  . Coronary artery disease    mild left internal carotid artery stenosis  . Glaucoma   . Gout   . Heart murmur   . Hyperlipidemia   . Hypertension   . Tubular adenoma of colon 2003   Dr. Watt Climes  . Vitamin D deficiency      Patient Active Problem List   Diagnosis Date Noted  . Renal artery stenosis ([place]) 01/09/2019  . Herpes zoster without complication 48/18/5631  . Bile leak 10/08/2017  . Anemia 08/19/2015  . CKD (chronic kidney disease) stage 4, GFR 15-29 ml/min (HCC) 08/19/2015  . BMI 23.0-23.9, adult 05/17/2015  . Abdominal aortic aneurysm ([place]) 12/02/2013  . Medication management 09/01/2013  . Essential hypertension 07/17/2013  . Other abnormal glucose 07/17/2013  .  Gout   . Vitamin D deficiency   . 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 Carotid artery disease ([place]) 01/15/2013  . Right bundle branch block 01/15/2013  . Hyperlipidemia 01/15/2013  . Herniated lumbar intervertebral disc 06/06/2012     Past Surgical History:  Procedure Laterality Date  . BACK SURGERY    . CARDIAC CATHETERIZATION     stents  last -07  . CARDIAC STENTS    . CERVICAL DISC SURGERY    . CHOLECYSTECTOMY N/A 09/26/2017   Procedure: LAPAROSCOPIC CHOLECYSTECTOMY;  Surgeon: Rolm Bookbinder, MD;  Location: [facility];  Service: General;  Laterality: N/A;  . ERCP N/A 10/09/2017   Procedure: ENDOSCOPIC RETROGRADE CHOLANGIOPANCREATOGRAPHY (ERCP);  Surgeon: Milus Banister, MD;  Location[facility] ENDOSCOPY;  Service: Endoscopy;  Laterality: N/A;  . EYE SURGERY Left 06   retinal detachment  . HERNIA REPAIR Right 93  . IR RADIOLOGIST EVAL & MGMT  10/29/2017  . LUMBAR LAMINECTOMY  06/06/2012   Procedure: MICRODISCECTOMY LUMBAR LAMINECTOMY;  Surgeon: Marybelle Killings, MD;  Location: [facility];  Service: Orthopedics;  Laterality: Left;  Left L4-5 Microdiscectomy  . SHOULDER ARTHROSCOPY Right   . TOTAL HIP ARTHROPLASTY Left       Current Outpatient Medications:  .  acetaminophen (TYLENOL) 325 MG tablet, Take 2 tablets (650 mg total) by mouth every 6 (six) hours as needed for mild pain (or temp > 100)., Disp: , Rfl:  .  allopurinol (ZYLOPRIM) 300 MG tablet, TAKE 1 TABLET BY MOUTH ONCE DAILY, Disp: 90 tablet, Rfl: 1 .  amLODipine (NORVASC) 10 MG tablet, Take 10 mg by mouth daily., Disp: , Rfl: 0 .  Aspirin Buf,CaCarb-MgCarb-MgO, 81 MG TABS, Take by mouth., Disp: , Rfl:  .  aspirin EC 81 MG tablet, Take 81 mg by mouth daily., Disp: , Rfl:  .  brimonidine (ALPHAGAN) 0.15 % ophthalmic solution, Place 1 drop into both eyes 2 (two) times daily., Disp: , Rfl:  .  Cholecalciferol (VITAMIN D3) 5000 UNITS TABS, Take 5,000 Units by mouth daily., Disp: , Rfl:  .  clopidogrel (PLAVIX) 75 MG tablet, TAKE 1 TABLET BY MOUTH ONCE DAILY  FOR HEART STENTS (Patient taking differently: TAKE 1 TABLET (75mg ) BY MOUTH ONCE DAILY FOR HEART STENTS), Disp: 90 tablet, Rfl: 1 .  dorzolamide (TRUSOPT) 2 % ophthalmic solution, Place 1 drop into the right eye 3 (three) times daily., Disp: , Rfl:  .  fenofibrate micronized (LOFIBRA) 134 MG capsule, TAKE 1 CAPSULE BY MOUTH ONCE DAILY, Disp: 90 capsule, Rfl: 1 .  furosemide (LASIX) 40 MG tablet, Take 40-80 mg by mouth daily. 80mg  in the morning and 40mg  in the evening, Disp: , Rfl: 4 .  loteprednol (LOTEMAX) 0.5 % ophthalmic suspension, Place 1 drop into the right eye 2 (two) times daily. , Disp: , Rfl:  .  nepafenac (ILEVRO) 0.3 % ophthalmic suspension, Place 1 drop into the right eye daily., Disp: , Rfl:  .  pravastatin (PRAVACHOL) 40 MG tablet, TAKE 1 TABLET BY MOUTH AT BEDTIME FOR CHOLESTEROL, Disp: 90 tablet, Rfl: 1   Allergies  Allergen Reactions  . Toprol Xl [Metoprolol Tartrate] Other (See Comments)    Bradycardia with beta blockers  . Lipitor [Atorvastatin]     Myalgias   . Coumadin [Warfarin Sodium] Rash  . Tape Rash    Clear tape  . Warfarin Rash     Social History   Occupational History  . Not on file  Tobacco Use  . Smoking status: Never Smoker  . Smokeless tobacco: Never Used  Substance and Sexual Activity  . Alcohol use: No  . Drug use: No  . Sexual activity: Not on file     Family History  Problem Relation Age of Onset  . Other Brother        laryngeal cancer  . Heart disease Brother   . Heart disease Mother   . Heart disease Father   . Stroke Father   . Heart disease Sister   . Diabetes Brother   . Colon cancer Neg Hx   . Esophageal cancer Neg Hx   . Stomach cancer Neg Hx   . Rectal cancer Neg Hx      Immunization History  Administered Date(s) Administered  . Influenza Split 04/09/2013  . Influenza, High Dose Seasonal PF 03/12/2014, 03/16/2015, 03/28/2016, 04/02/2017  . Pneumococcal Conjugate-13 03/12/2014  . Pneumococcal-Unspecified 06/25/2005   . Td 06/25/2002     Review of systems: Positive Findings in bold print.  Constitutional:  chills, fatigue, fever, sweats, weight change Communication: Optometrist, sign Ecologist, hand writing, iPad/Android device Head: headaches, head injury Eyes: changes in vision, eye pain, glaucoma, cataracts, macular degeneration, diplopia, glare,  light sensitivity, eyeglasses or contacts, blindness Ears nose mouth throat: hearing impaired, hearing aids,  ringing in ears, deaf, sign language,  vertigo,   nosebleeds,  rhinitis,  cold sores, snoring, swollen glands Cardiovascular: HTN, edema, arrhythmia, pacemaker in place, cardiac stents,  defibrillator in place,  chest pain/tightness, chronic anticoagulation, blood clot, heart failure, MI Peripheral Vascular: leg cramps, varicose veins, blood clots, lymphedema, varicosities Respiratory:  difficulty breathing, denies congestion, SOB, wheezing, cough, emphysema Gastrointestinal: change in appetite or weight, abdominal pain, constipation, diarrhea, nausea, vomiting, vomiting blood, change in bowel habits, abdominal pain, jaundice, rectal bleeding, hemorrhoids, GERD Genitourinary:  nocturia,  pain on urination, polyuria,  blood in urine, Foley catheter, urinary urgency, ESRD on hemodialysis Musculoskeletal: amputation, cramping, stiff joints, painful joints, decreased joint motion, fractures, OA, gout, hemiplegia, paraplegia, uses cane, wheelchair bound, uses walker, uses rollator Skin: +changes in toenails, color change, dryness, itching, mole changes,  rash, wound(s) Neurological: headaches, numbness in feet, paresthesias in feet, burning in feet, fainting,  seizures, change in speech. denies headaches, memory problems/poor historian, cerebral palsy, weakness, paralysis, CVA, TIA Endocrine: diabetes, hypothyroidism, hyperthyroidism,  goiter, dry mouth, flushing, heat intolerance,  cold intolerance,  excessive thirst, denies polyuria,   nocturia Hematological:  easy bleeding, excessive bleeding, easy bruising, enlarged lymph nodes, on long term blood thinner, history of past transusions Allergy/immunological:  hives, eczema, frequent infections, multiple drug allergies, seasonal allergies, transplant recipient, multiple food allergies Psychiatric:  anxiety, depression, mood disorder, suicidal ideations, hallucinations, insomnia  Objective: Vitals:   01/26/19 1102  BP: (!) 184/84  Temp: 97.9 F (36.6 C)    Vascular Examination: Capillary refill time immediate  x 10 digits.  Dorsalis pedis and posterior tibial pulses present b/l.  Digital hair present x 10 digits.  Skin temperature WNL b/l.  Dermatological Examination: Skin with normal turgor, texture and tone.  Toenails 1-5 b/l are painful, elongated, discolored, dystrophic with subungual debris. Pain with dorsal palpation of nailplates. No erythema, no edema, no drainage noted.   Porokeratotic lesions submet heads 1, 5 left foot with tenderness to palpation. No erythema, no edema, no drainage, no flocculence.  Hyperkeratotic lesion right hallux IPJ with tenderness to palpation. No edema, no erythema, no drainage, no flocculence.  Musculoskeletal: Muscle strength 5/5 to all LE muscle groups.  HAV with bunion left foot  Neurological: Sensation intact with 10 gram monofilament.  Vibratory sensation intact.  Assessment: Painful onychomycosis 1-5 b/l Painful porokeratotic lesions submet heads 1, 5 left foot Callus right hallux IPJ   Plan: Toenails 1-5 b/l debrided in length and girth without iatrogenic laceration. Avoid self trimming due to use of blood thinners. Porokeratosis submet heads 1, 5 left foot pared and enucleated with sterile scalpel blade without incident. Avoid self trimming due to use of blood thinners. Calluses pared right hallux IPJ utilizing sterile scalpel blade without incident. Patient to continue soft, supportive shoe gear  daily. Patient to report any pedal injuries to medical professional immediately. Follow up 3 months.  Patient/POA to call should there be a concern in the interim.

## 2019-02-02 ENCOUNTER — Encounter (INDEPENDENT_AMBULATORY_CARE_PROVIDER_SITE_OTHER): Payer: Medicare Other | Admitting: Ophthalmology

## 2019-02-02 ENCOUNTER — Other Ambulatory Visit: Payer: Self-pay

## 2019-02-02 DIAGNOSIS — H34811 Central retinal vein occlusion, right eye, with macular edema: Secondary | ICD-10-CM | POA: Diagnosis not present

## 2019-02-02 DIAGNOSIS — H348122 Central retinal vein occlusion, left eye, stable: Secondary | ICD-10-CM | POA: Diagnosis not present

## 2019-02-02 DIAGNOSIS — I10 Essential (primary) hypertension: Secondary | ICD-10-CM | POA: Diagnosis not present

## 2019-02-02 DIAGNOSIS — H43813 Vitreous degeneration, bilateral: Secondary | ICD-10-CM

## 2019-02-02 DIAGNOSIS — H35033 Hypertensive retinopathy, bilateral: Secondary | ICD-10-CM | POA: Diagnosis not present

## 2019-02-10 ENCOUNTER — Ambulatory Visit (INDEPENDENT_AMBULATORY_CARE_PROVIDER_SITE_OTHER): Payer: Medicare Other | Admitting: Pharmacist Clinician (PhC)/ Clinical Pharmacy Specialist

## 2019-02-10 ENCOUNTER — Other Ambulatory Visit: Payer: Self-pay

## 2019-02-10 DIAGNOSIS — I10 Essential (primary) hypertension: Secondary | ICD-10-CM

## 2019-02-10 MED ORDER — HYDRALAZINE HCL 25 MG PO TABS: 25.0000 mg | ORAL_TABLET | Freq: Two times a day (BID) | ORAL | 3 refills | Status: DC

## 2019-02-10 NOTE — Patient Instructions (Addendum)
Return for a a follow up appointment in 2 months  Your blood pressure today is 152/62  Check your blood pressure at home daily and keep record of the readings.  Take your BP meds as follows:    Start HYDRALAZINE 25 mg twice daily (morning and night)  Continue all other medications  Bring all of your meds, your BP cuff and your record of home blood pressures to your next appointment.  Exercise as you're able, try to walk approximately 30 minutes per day.  Keep salt intake to a minimum, especially watch canned and prepared boxed foods.  Eat more fresh fruits and vegetables and fewer canned items.  Avoid eating in fast food restaurants.    HOW TO TAKE YOUR BLOOD PRESSURE: . Rest 5 minutes before taking your blood pressure. .  Don't smoke or drink caffeinated beverages for at least 30 minutes before. . Take your blood pressure before (not after) you eat. . Sit comfortably with your back supported and both feet on the floor (don't cross your legs). . Elevate your arm to heart level on a table or a desk. . Use the proper sized cuff. It should fit smoothly and snugly around your bare upper arm. There should be enough room to slip a fingertip under the cuff. The bottom edge of the cuff should be 1 inch above the crease of the elbow. . Ideally, take 3 measurements at one sitting and record the average.

## 2019-02-10 NOTE — Assessment & Plan Note (Signed)
Patient with essential hypertension and limited possibilities of medication.  His CKD and bradycardia eliminate many potential options for [place].  He will continue with the amlodipine and clonidine, and we will add hydralazine 25 mg twice daily.  He should continue with twice daily BP checks, although they do not need to be every day.  We will see him back in 2 months for follow up.

## 2019-02-10 NOTE — Progress Notes (Signed)
02/10/2019 Terry Garner Feb 12, 1941 [id number]   HPI:  Terry Garner is a 78 y.o. male patient of Dr [name], with a PMH below who presents today for hypertension clinic evaluation.   In addition to hypertension, his medical history is significant for CAD (s/p LAD and diagonal branch stenting), RBBB, hyperlipidemia, renal artery stenosis and CKD (SCr 3.24).  Today Terry Garner is here with his wife, as he is somewhat hard of hearing.  In addition to what we had on file, he has apparently been using clonidine 0.1 mg patches for some time.  All other medications were correct.  He has no complaints of CP or SOB.  He does note some dizziness upon arising in the mornings, but does well if he moves slowly.  He also endorses some lower extremity edema, states it usually goes away when he takes the furosemide and/or elevates his legs.   Blood Pressure Goal:  130/80  Current Medications: amlodipine 10 mg qd (am), clonidine 0.1 mg patch (Wednesdays)  Family Hx: paternal family with history of strokes, brother with hypertension, sister with MI; son with hypertension  Social Hx: no tobacco, no alcohol, occasional coffee and pepsi  Diet: drinking 2 ensure per day, rarely eats restaurant food.  Wife concerned about ongoing weight loss and lack of appetite.    Exercise:  Stays active tinkering with Conservation officer, nature, shop or yard; eyesight diminishing, so doing less  Home BP readings: patient checked home BP bid for past month.  All diastolic readings were < 80.  Highest systolic 154, lowest 008.  Average AM BP for the past 10 days was 159/67, PM 169/71.  Home meter read within 10 points of office cuff.  Intolerances: avoid all diuretics, ACEI/ARB due to kidney function  Avoid beta blockers due to bradycardia  Labs:  12/2018:  Na 144, K 4.7, Glu 88, BUN 48, SCr 3.24  Wt Readings from Last 3 Encounters:  02/10/19 159 lb (72.1 kg)  01/09/19 164 lb 12.8 oz (74.8 kg)  12/04/17 172 lb 9.6 oz (78.3 kg)   BP  Readings from Last 3 Encounters:  02/10/19 (!) 152/62  01/26/19 (!) 184/84  01/09/19 (!) 195/74   Pulse Readings from Last 3 Encounters:  02/10/19 (!) 52  01/09/19 62  12/04/17 76    Current Outpatient Medications  Medication Sig Dispense Refill  . allopurinol (ZYLOPRIM) 300 MG tablet TAKE 1 TABLET BY MOUTH ONCE DAILY 90 tablet 1  . amLODipine (NORVASC) 10 MG tablet Take 10 mg by mouth daily.  0  . aspirin EC 81 MG tablet Take 81 mg by mouth daily.    . brimonidine (ALPHAGAN) 0.15 % ophthalmic solution Place 1 drop into both eyes 2 (two) times daily.    . Cholecalciferol (VITAMIN D3) 5000 UNITS TABS Take 5,000 Units by mouth daily.    . cloNIDine (CATAPRES - DOSED IN MG/24 HR) 0.1 mg/24hr patch Place 0.1 mg onto the skin once a week.    . clopidogrel (PLAVIX) 75 MG tablet TAKE 1 TABLET BY MOUTH ONCE DAILY FOR HEART STENTS (Patient taking differently: TAKE 1 TABLET (75mg ) BY MOUTH ONCE DAILY FOR HEART STENTS) 90 tablet 1  . dorzolamide (TRUSOPT) 2 % ophthalmic solution Place 1 drop into the right eye 3 (three) times daily.    . fenofibrate micronized (LOFIBRA) 134 MG capsule TAKE 1 CAPSULE BY MOUTH ONCE DAILY 90 capsule 1  . ferrous sulfate 325 (65 FE) MG tablet Take 325 mg by mouth daily with  breakfast.    . furosemide (LASIX) 40 MG tablet Take 40-80 mg by mouth daily. 80mg  in the morning and 40mg  in the evening  4  . nepafenac (ILEVRO) 0.3 % ophthalmic suspension Place 1 drop into the right eye daily.    . hydrALAZINE (APRESOLINE) 25 MG tablet Take 1 tablet (25 mg total) by mouth 2 (two) times daily. 60 tablet 3  . pravastatin (PRAVACHOL) 40 MG tablet TAKE 1 TABLET BY MOUTH AT BEDTIME FOR CHOLESTEROL (Patient taking differently: Take 20 mg by mouth daily. ) 90 tablet 1   No current facility-administered medications for this visit.     Allergies  Allergen Reactions  . Toprol Xl [Metoprolol Tartrate] Other (See Comments)    Bradycardia with beta blockers  . Lipitor [Atorvastatin]      Myalgias   . Coumadin [Warfarin Sodium] Rash  . Tape Rash    Clear tape  . Warfarin Rash    Past Medical History:  Diagnosis Date  . AAA (abdominal aortic aneurysm) (HCC)    3.6 cm (01/09/12 ultrasound)  . Allergy   . Arthritis   . Bile duct leak 09/2017   post op  . Carotid artery disease ([place])   . Cataract   . Chronic kidney disease    CKD, right renal artery stenosis  . Coronary artery disease    mild left internal carotid artery stenosis  . Glaucoma   . Gout   . Heart murmur   . Hyperlipidemia   . Hypertension   . Tubular adenoma of colon 2003   Dr. Watt Climes  . Vitamin D deficiency     Blood pressure (!) 152/62, pulse (!) 52, height 6\' 2"  (1.88 m), weight 159 lb (72.1 kg).  Home meter 150/66 HR 46 Essential hypertension Patient with essential hypertension and limited possibilities of medication.  His CKD and bradycardia eliminate many potential options for [place].  He will continue with the amlodipine and clonidine, and we will add hydralazine 25 mg twice daily.  He should continue with twice daily BP checks, although they do not need to be every day.  We will see him back in 2 months for follow up.     Terry Garner PharmD CPP [facility] [address] [place], [place] [address] [phone]

## 2019-02-13 DIAGNOSIS — I10 Essential (primary) hypertension: Secondary | ICD-10-CM | POA: Diagnosis not present

## 2019-02-13 DIAGNOSIS — E559 Vitamin D deficiency, unspecified: Secondary | ICD-10-CM | POA: Diagnosis not present

## 2019-02-13 DIAGNOSIS — Z789 Other specified health status: Secondary | ICD-10-CM | POA: Diagnosis not present

## 2019-02-26 DIAGNOSIS — H40013 Open angle with borderline findings, low risk, bilateral: Secondary | ICD-10-CM | POA: Diagnosis not present

## 2019-02-26 DIAGNOSIS — H353231 Exudative age-related macular degeneration, bilateral, with active choroidal neovascularization: Secondary | ICD-10-CM | POA: Diagnosis not present

## 2019-02-26 DIAGNOSIS — H348312 Tributary (branch) retinal vein occlusion, right eye, stable: Secondary | ICD-10-CM | POA: Diagnosis not present

## 2019-02-26 DIAGNOSIS — H348132 Central retinal vein occlusion, bilateral, stable: Secondary | ICD-10-CM | POA: Diagnosis not present

## 2019-03-11 ENCOUNTER — Encounter (INDEPENDENT_AMBULATORY_CARE_PROVIDER_SITE_OTHER): Payer: Medicare Other | Admitting: Ophthalmology

## 2019-03-11 ENCOUNTER — Other Ambulatory Visit: Payer: Self-pay

## 2019-03-11 DIAGNOSIS — I10 Essential (primary) hypertension: Secondary | ICD-10-CM

## 2019-03-11 DIAGNOSIS — H34811 Central retinal vein occlusion, right eye, with macular edema: Secondary | ICD-10-CM

## 2019-03-11 DIAGNOSIS — H353132 Nonexudative age-related macular degeneration, bilateral, intermediate dry stage: Secondary | ICD-10-CM

## 2019-03-11 DIAGNOSIS — H348122 Central retinal vein occlusion, left eye, stable: Secondary | ICD-10-CM

## 2019-03-11 DIAGNOSIS — H43813 Vitreous degeneration, bilateral: Secondary | ICD-10-CM

## 2019-03-11 DIAGNOSIS — H35033 Hypertensive retinopathy, bilateral: Secondary | ICD-10-CM

## 2019-03-17 DIAGNOSIS — Z789 Other specified health status: Secondary | ICD-10-CM | POA: Diagnosis not present

## 2019-03-17 DIAGNOSIS — E7800 Pure hypercholesterolemia, unspecified: Secondary | ICD-10-CM | POA: Diagnosis not present

## 2019-03-17 DIAGNOSIS — I10 Essential (primary) hypertension: Secondary | ICD-10-CM | POA: Diagnosis not present

## 2019-04-06 DIAGNOSIS — E559 Vitamin D deficiency, unspecified: Secondary | ICD-10-CM | POA: Diagnosis not present

## 2019-04-06 DIAGNOSIS — Z789 Other specified health status: Secondary | ICD-10-CM | POA: Diagnosis not present

## 2019-04-06 DIAGNOSIS — E7800 Pure hypercholesterolemia, unspecified: Secondary | ICD-10-CM | POA: Diagnosis not present

## 2019-04-13 NOTE — Progress Notes (Signed)
HPI:  Terry Garner is a 78 y.o. male patient of Dr [name], with a PMH below who presents today for hypertension clinic follow up.   In addition to hypertension, his medical history is significant for CAD (s/p LAD and diagonal branch stenting), RBBB, hyperlipidemia, renal artery stenosis and CKD (SCr 3.24).  He has no complaints of CP or SOB.  He does note some dizziness upon arising in the mornings, but does well if he moves slowly.  He also endorses some lower extremity edema, states it usually goes away when he takes the furosemide and/or elevates his legs.  He reports very minimal change in BP readings with hydralazine 25mg  but denies ADR or compliance issues.  Blood Pressure Goal:  130/80  Current Medications: amlodipine 10 mg qd (am) clonidine 0.1 mg patch (Wednesdays) hydralazine 25mg  twice daily (7am& 7pm) - started 02/10/2019 Furosemide 80mg  in AM and 40mg  in PM  Family Hx: paternal family with history of strokes, brother with hypertension, sister with MI; son with hypertension  Social Hx: no tobacco, no alcohol, occasional coffee and pepsi  Diet: drinking 2 ensure per day, rarely eats restaurant food.  Wife concerned about ongoing weight loss and lack of appetite.    Exercise:  Stays active tinkering with Conservation officer, nature, shop or yard; eyesight diminishing, so doing less  Home BP readings:  161/72 pulse 57  Intolerances: avoid all diuretics, ACEI/ARB due to kidney function  Avoid beta blockers due to bradycardia  Labs:  12/2018:  Na 144, K 4.7, Glu 88, BUN 48, SCr 3.24  Wt Readings from Last 3 Encounters:  04/14/19 165 lb 6.4 oz (75 kg)  02/10/19 159 lb (72.1 kg)  01/09/19 164 lb 12.8 oz (74.8 kg)   BP Readings from Last 3 Encounters:  04/14/19 (!) 158/58  02/10/19 (!) 152/62  01/26/19 (!) 184/84   Pulse Readings from Last 3 Encounters:  04/14/19 (!) 52  02/10/19 (!) 52  01/09/19 62    Current Outpatient Medications  Medication Sig Dispense Refill  .  allopurinol (ZYLOPRIM) 300 MG tablet TAKE 1 TABLET BY MOUTH ONCE DAILY 90 tablet 1  . amLODipine (NORVASC) 10 MG tablet Take 10 mg by mouth daily.  0  . aspirin EC 81 MG tablet Take 81 mg by mouth daily.    . brimonidine (ALPHAGAN) 0.15 % ophthalmic solution Place 1 drop into both eyes 2 (two) times daily.    . Cholecalciferol (VITAMIN D3) 5000 UNITS TABS Take 5,000 Units by mouth daily.    . cloNIDine (CATAPRES - DOSED IN MG/24 HR) 0.1 mg/24hr patch Place 0.1 mg onto the skin once a week.    . clopidogrel (PLAVIX) 75 MG tablet TAKE 1 TABLET BY MOUTH ONCE DAILY FOR HEART STENTS (Patient taking differently: TAKE 1 TABLET (75mg ) BY MOUTH ONCE DAILY FOR HEART STENTS) 90 tablet 1  . dorzolamide (TRUSOPT) 2 % ophthalmic solution Place 1 drop into the right eye 3 (three) times daily.    . fenofibrate micronized (LOFIBRA) 134 MG capsule TAKE 1 CAPSULE BY MOUTH ONCE DAILY 90 capsule 1  . ferrous sulfate 325 (65 FE) MG tablet Take 325 mg by mouth daily with breakfast.    . furosemide (LASIX) 40 MG tablet Take 40-80 mg by mouth daily. 80mg  in the morning and 40mg  in the evening  4  . hydrALAZINE (APRESOLINE) 50 MG tablet Take 1 tablet (50 mg total) by mouth 2 (two) times daily. 60 tablet 1  . nepafenac (ILEVRO) 0.3 %  ophthalmic suspension Place 1 drop into the right eye daily.    . pravastatin (PRAVACHOL) 40 MG tablet TAKE 1 TABLET BY MOUTH AT BEDTIME FOR CHOLESTEROL (Patient taking differently: Take 20 mg by mouth daily. ) 90 tablet 1   No current facility-administered medications for this visit.     Allergies  Allergen Reactions  . Toprol Xl [Metoprolol Tartrate] Other (See Comments)    Bradycardia with beta blockers  . Lipitor [Atorvastatin]     Myalgias   . Coumadin [Warfarin Sodium] Rash  . Tape Rash    Clear tape  . Warfarin Rash    Past Medical History:  Diagnosis Date  . AAA (abdominal aortic aneurysm) (HCC)    3.6 cm (01/09/12 ultrasound)  . Allergy   . Arthritis   . Bile duct leak  09/2017   post op  . Carotid artery disease ([place])   . Cataract   . Chronic kidney disease    CKD, right renal artery stenosis  . Coronary artery disease    mild left internal carotid artery stenosis  . Glaucoma   . Gout   . Heart murmur   . Hyperlipidemia   . Hypertension   . Tubular adenoma of colon 2003   Dr. Watt Climes  . Vitamin D deficiency     Blood pressure (!) 158/58, pulse (!) 52, height 6\' 2"  (1.88 m), weight 165 lb 6.4 oz (75 kg), SpO2 97 %.    Essential hypertension Blood pressure remains above goal and not significantly changed since hydralazine 25mg  was started. Noted HR is in the 50s but patient is not on any inotropic CC or any beta-blocker.   Will change hydralazine to 50mg  BID, continue positive lifestyle modifications, and follow up in 8 weeks.    Madora Barletta Rodriguez-Guzman PharmD, BCPS, [place] [address] [place] [address] 04/19/2019 5:27 PM

## 2019-04-14 ENCOUNTER — Ambulatory Visit: Payer: Medicare Other

## 2019-04-14 ENCOUNTER — Ambulatory Visit (INDEPENDENT_AMBULATORY_CARE_PROVIDER_SITE_OTHER): Payer: Medicare Other | Admitting: Pharmacist

## 2019-04-14 ENCOUNTER — Other Ambulatory Visit: Payer: Self-pay

## 2019-04-14 VITALS — BP 158/58 | HR 52 | Ht 74.0 in | Wt 165.4 lb

## 2019-04-14 DIAGNOSIS — I10 Essential (primary) hypertension: Secondary | ICD-10-CM | POA: Diagnosis not present

## 2019-04-14 MED ORDER — HYDRALAZINE HCL 50 MG PO TABS: 50.0000 mg | ORAL_TABLET | Freq: Two times a day (BID) | ORAL | 1 refills | Status: DC

## 2019-04-14 NOTE — Patient Instructions (Signed)
Return for a  follow up appointment in 2 months  Check your blood pressure at home daily (if able) and keep record of the readings.  Take your BP meds as follows: *STOP taking hydralazine 25mg * *START taking hydralazine 50mg  twice daily*  Bring all of your meds, your BP cuff and your record of home blood pressures to your next appointment.  Exercise as you're able, try to walk approximately 30 minutes per day.  Keep salt intake to a minimum, especially watch canned and prepared boxed foods.  Eat more fresh fruits and vegetables and fewer canned items.  Avoid eating in fast food restaurants.    HOW TO TAKE YOUR BLOOD PRESSURE: . Rest 5 minutes before taking your blood pressure. .  Don't smoke or drink caffeinated beverages for at least 30 minutes before. . Take your blood pressure before (not after) you eat. . Sit comfortably with your back supported and both feet on the floor (don't cross your legs). . Elevate your arm to heart level on a table or a desk. . Use the proper sized cuff. It should fit smoothly and snugly around your bare upper arm. There should be enough room to slip a fingertip under the cuff. The bottom edge of the cuff should be 1 inch above the crease of the elbow. . Ideally, take 3 measurements at one sitting and record the average.

## 2019-04-19 ENCOUNTER — Encounter: Payer: Self-pay | Admitting: Pharmacist

## 2019-04-19 NOTE — Assessment & Plan Note (Signed)
Blood pressure remains above goal and not significantly changed since hydralazine 25mg  was started. Noted HR is in the 50s but patient is not on any inotropic CC or any beta-blocker.   Will change hydralazine to 50mg  BID, continue positive lifestyle modifications, and follow up in 8 weeks.

## 2019-04-20 ENCOUNTER — Other Ambulatory Visit: Payer: Self-pay | Admitting: Physician Assistant

## 2019-04-22 ENCOUNTER — Encounter (INDEPENDENT_AMBULATORY_CARE_PROVIDER_SITE_OTHER): Payer: Medicare Other | Admitting: Ophthalmology

## 2019-04-22 ENCOUNTER — Other Ambulatory Visit: Payer: Self-pay | Admitting: Physician Assistant

## 2019-04-22 DIAGNOSIS — I10 Essential (primary) hypertension: Secondary | ICD-10-CM | POA: Diagnosis not present

## 2019-04-22 DIAGNOSIS — H34813 Central retinal vein occlusion, bilateral, with macular edema: Secondary | ICD-10-CM

## 2019-04-22 DIAGNOSIS — H34231 Retinal artery branch occlusion, right eye: Secondary | ICD-10-CM | POA: Diagnosis not present

## 2019-04-22 DIAGNOSIS — H43813 Vitreous degeneration, bilateral: Secondary | ICD-10-CM

## 2019-04-22 DIAGNOSIS — H35033 Hypertensive retinopathy, bilateral: Secondary | ICD-10-CM | POA: Diagnosis not present

## 2019-04-24 ENCOUNTER — Other Ambulatory Visit: Payer: Self-pay | Admitting: Physician Assistant

## 2019-04-29 DIAGNOSIS — E559 Vitamin D deficiency, unspecified: Secondary | ICD-10-CM | POA: Diagnosis not present

## 2019-04-29 DIAGNOSIS — Z13220 Encounter for screening for lipoid disorders: Secondary | ICD-10-CM | POA: Diagnosis not present

## 2019-04-29 DIAGNOSIS — Z0000 Encounter for general adult medical examination without abnormal findings: Secondary | ICD-10-CM | POA: Diagnosis not present

## 2019-04-29 DIAGNOSIS — I10 Essential (primary) hypertension: Secondary | ICD-10-CM | POA: Diagnosis not present

## 2019-04-29 DIAGNOSIS — Z131 Encounter for screening for diabetes mellitus: Secondary | ICD-10-CM | POA: Diagnosis not present

## 2019-05-04 ENCOUNTER — Encounter: Payer: Self-pay | Admitting: Podiatry

## 2019-05-04 ENCOUNTER — Ambulatory Visit: Payer: Medicare Other | Admitting: Podiatry

## 2019-05-04 ENCOUNTER — Other Ambulatory Visit: Payer: Self-pay

## 2019-05-04 DIAGNOSIS — B351 Tinea unguium: Secondary | ICD-10-CM

## 2019-05-04 DIAGNOSIS — M79674 Pain in right toe(s): Secondary | ICD-10-CM

## 2019-05-04 DIAGNOSIS — M79675 Pain in left toe(s): Secondary | ICD-10-CM

## 2019-05-04 DIAGNOSIS — M79672 Pain in left foot: Secondary | ICD-10-CM | POA: Diagnosis not present

## 2019-05-04 DIAGNOSIS — Z9229 Personal history of other drug therapy: Secondary | ICD-10-CM

## 2019-05-04 DIAGNOSIS — L84 Corns and callosities: Secondary | ICD-10-CM

## 2019-05-04 DIAGNOSIS — M79671 Pain in right foot: Secondary | ICD-10-CM | POA: Diagnosis not present

## 2019-05-04 NOTE — Progress Notes (Signed)
Subjective: Terry Garner is seen today for follow up painful, elongated, thickened toenails 1-5 b/l feet that he cannot cut. Pain interferes with daily activities. Aggravating factor includes wearing enclosed shoe gear and relieved with periodic debridement.  [name] has purchased his Skechers and is very pleased with them. He relates his plantar lesions are much better with the change in shoe gear.   He remains on blood thinner, Plavix.  His wife is present during the visit.  Current Outpatient Medications on File Prior to Visit  Medication Sig  . Aspirin Buf,CaCarb-MgCarb-MgO, 81 MG TABS Take by mouth.  [name] allopurinol (ZYLOPRIM) 300 MG tablet TAKE 1 TABLET BY MOUTH ONCE DAILY  . amLODipine (NORVASC) 10 MG tablet Take 10 mg by mouth daily.  [name] aspirin EC 81 MG tablet Take 81 mg by mouth daily.  . brimonidine (ALPHAGAN) 0.15 % ophthalmic solution Place 1 drop into both eyes 2 (two) times daily.  . Cholecalciferol (VITAMIN D3) 5000 UNITS TABS Take 5,000 Units by mouth daily.  . cloNIDine (CATAPRES - DOSED IN MG/24 HR) 0.1 mg/24hr patch Place 0.1 mg onto the skin once a week.  . clopidogrel (PLAVIX) 75 MG tablet TAKE 1 TABLET BY MOUTH ONCE DAILY FOR HEART STENTS (Patient taking differently: TAKE 1 TABLET (75mg ) BY MOUTH ONCE DAILY FOR HEART STENTS)  . dorzolamide (TRUSOPT) 2 % ophthalmic solution Place 1 drop into the right eye 3 (three) times daily.  . fenofibrate micronized (LOFIBRA) 134 MG capsule TAKE 1 CAPSULE BY MOUTH ONCE DAILY  . ferrous sulfate 325 (65 FE) MG tablet Take 325 mg by mouth daily with breakfast.  . furosemide (LASIX) 40 MG tablet Take 40-80 mg by mouth daily. 80mg  in the morning and 40mg  in the evening  . hydrALAZINE (APRESOLINE) 50 MG tablet Take 1 tablet (50 mg total) by mouth 2 (two) times daily.  [name] ketorolac (ACULAR) 0.5 % ophthalmic solution INT 1 GTT IN OD QID FOR 5 DAYS  . nepafenac (ILEVRO) 0.3 % ophthalmic suspension Place 1 drop into the right eye daily.  .  pravastatin (PRAVACHOL) 40 MG tablet TAKE 1 TABLET BY MOUTH AT BEDTIME FOR CHOLESTEROL (Patient taking differently: Take 20 mg by mouth daily. )   No current facility-administered medications on file prior to visit.      Allergies  Allergen Reactions  . Toprol Xl [Metoprolol Tartrate] Other (See Comments)    Bradycardia with beta blockers  . Lipitor [Atorvastatin]     Myalgias   . Coumadin [Warfarin Sodium] Rash  . Tape Rash    Clear tape  . Warfarin Rash    Objective:  Vascular Examination: Capillary refill time immediate x 10 digits.  Dorsalis pedis present b/l.  Posterior tibial pulses present b/l.  Digital hair present x 10 digits.  Skin temperature gradient WNL b/l.   Dermatological Examination: Skin with normal turgor, texture and tone b/l  Toenails 1-5 b/l discolored, thick, dystrophic with subungual debris and pain with palpation to nailbeds due to thickness of nails.  Hyperkeratotic lesion right hallux, submet heads 1, 5 left foot with tenderness to palpation. No edema, no erythema, no drainage, no flocculence.  Musculoskeletal: Muscle strength 5/5 to all LE muscle groups  HAV with bunion left foot.  No pain, crepitus or joint limitation noted with ROM.   Neurological Examination: Protective sensation intact 5/5 with 10 gram monofilament bilaterally.  Assessment: Painful onychomycosis toenails 1-5 b/l  Calluses right hallux and submet heads 1, 5 left foot Long term use of blood  thinners  Plan: 1. Toenails 1-5 b/l were debrided in length and girth without iatrogenic bleeding. Avoid self trimming due to use of blood thinners. 2. Hyperkeratotic lesions pared right hallux and submet heads 1, 5 left foot. 3. Patient to continue soft, supportive shoe gear. 4. Patient to report any pedal injuries to medical professional immediately. 5. Follow up 3 months.  6. Patient/POA to call should there be a concern in the interim.

## 2019-05-14 DIAGNOSIS — Z131 Encounter for screening for diabetes mellitus: Secondary | ICD-10-CM | POA: Diagnosis not present

## 2019-05-14 DIAGNOSIS — M129 Arthropathy, unspecified: Secondary | ICD-10-CM | POA: Diagnosis not present

## 2019-05-14 DIAGNOSIS — R5383 Other fatigue: Secondary | ICD-10-CM | POA: Diagnosis not present

## 2019-05-14 DIAGNOSIS — E559 Vitamin D deficiency, unspecified: Secondary | ICD-10-CM | POA: Diagnosis not present

## 2019-05-14 DIAGNOSIS — Z79899 Other long term (current) drug therapy: Secondary | ICD-10-CM | POA: Diagnosis not present

## 2019-05-14 DIAGNOSIS — E7800 Pure hypercholesterolemia, unspecified: Secondary | ICD-10-CM | POA: Diagnosis not present

## 2019-05-18 DIAGNOSIS — E7800 Pure hypercholesterolemia, unspecified: Secondary | ICD-10-CM | POA: Diagnosis not present

## 2019-05-18 DIAGNOSIS — Z789 Other specified health status: Secondary | ICD-10-CM | POA: Diagnosis not present

## 2019-05-18 DIAGNOSIS — I10 Essential (primary) hypertension: Secondary | ICD-10-CM | POA: Diagnosis not present

## 2019-05-27 DIAGNOSIS — L578 Other skin changes due to chronic exposure to nonionizing radiation: Secondary | ICD-10-CM | POA: Diagnosis not present

## 2019-05-27 DIAGNOSIS — D225 Melanocytic nevi of trunk: Secondary | ICD-10-CM | POA: Diagnosis not present

## 2019-05-27 DIAGNOSIS — D1801 Hemangioma of skin and subcutaneous tissue: Secondary | ICD-10-CM | POA: Diagnosis not present

## 2019-05-27 DIAGNOSIS — L821 Other seborrheic keratosis: Secondary | ICD-10-CM | POA: Diagnosis not present

## 2019-06-01 ENCOUNTER — Other Ambulatory Visit: Payer: Self-pay

## 2019-06-01 ENCOUNTER — Encounter: Payer: Self-pay | Admitting: Medical

## 2019-06-01 ENCOUNTER — Ambulatory Visit (INDEPENDENT_AMBULATORY_CARE_PROVIDER_SITE_OTHER): Payer: Medicare Other | Admitting: Medical

## 2019-06-01 VITALS — BP 154/68 | HR 79 | Temp 97.8°F | Ht 74.0 in | Wt 160.0 lb

## 2019-06-01 DIAGNOSIS — H9190 Unspecified hearing loss, unspecified ear: Secondary | ICD-10-CM

## 2019-06-01 DIAGNOSIS — I779 Disorder of arteries and arterioles, unspecified: Secondary | ICD-10-CM

## 2019-06-01 DIAGNOSIS — I714 Abdominal aortic aneurysm, without rupture, unspecified: Secondary | ICD-10-CM

## 2019-06-01 DIAGNOSIS — R634 Abnormal weight loss: Secondary | ICD-10-CM | POA: Diagnosis not present

## 2019-06-01 DIAGNOSIS — M1000 Idiopathic gout, unspecified site: Secondary | ICD-10-CM | POA: Diagnosis not present

## 2019-06-01 DIAGNOSIS — K0889 Other specified disorders of teeth and supporting structures: Secondary | ICD-10-CM | POA: Insufficient documentation

## 2019-06-01 DIAGNOSIS — D649 Anemia, unspecified: Secondary | ICD-10-CM

## 2019-06-01 DIAGNOSIS — N184 Chronic kidney disease, stage 4 (severe): Secondary | ICD-10-CM

## 2019-06-01 DIAGNOSIS — K862 Cyst of pancreas: Secondary | ICD-10-CM

## 2019-06-01 DIAGNOSIS — I10 Essential (primary) hypertension: Secondary | ICD-10-CM

## 2019-06-01 NOTE — Progress Notes (Signed)
Subjective: Chief Complaint  Patient presents with  . New Patient (Initial Visit)  . Weight Loss   Here as a new patient along with his wife.   Sees Dr. Ledell Peoples office.  They were concerned about weight loss.  He has 1 son who lives in [place].  He has 1 grand dog, but no grandchildren.   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  Prior PCP, Dr. Nancy Fetter at [facility] on [address], for about the past year, Dr. Wayland Denis prior to that.   He and wife think he has lost weight gradually over the past 2 years.  No sudden weight loss.   He notes having some teeth pain, some discomfort and malocclusion with his bite.  He has not seen a dentist in at least a year.  In general he 2 regular meals a day and some snacks generally one snack in the evening.  He also drinks 2 ensures per day which could be one of the snacks.  His diet and food intake has been pretty consistent.  He denies cough, body aches, chills, fevers, night sweats, no blood in the stool or urine.  He denies any other pain.  He has chronic health issues.  He has kidney disease but has not seen [facility] in about a year.  He was seeing Dr. Mercy Moore before he retired.    He is a lifelong non-smoker, no alcohol use.  His wife Delene Ruffini says is not her cooking as she is gaining weight  No other aggravating or relieving factors. No other complaint.  He has some type of skin cancer and just recently had several skin lesions removed by [facility] dermat[facility], but says they were not the "killing kind of skin cancer."   Past Medical History:  Diagnosis Date  . AAA (abdominal aortic aneurysm) (HCC)    3.6 cm (01/09/12 ultrasound)  . Allergy   . Arthritis   . Bile duct leak 09/2017   post op  . Carotid artery disease ([place])   . Cataract   . Chronic kidney disease    CKD, right renal artery stenosis  . Coronary artery disease    mild left internal carotid  artery stenosis  . Glaucoma   . Gout   . Heart murmur   . Hyperlipidemia   . Hypertension   . Tubular adenoma of colon 2003   Dr. Watt Climes  . Vitamin D deficiency    Current Outpatient Medications on File Prior to Visit  Medication Sig Dispense Refill  . allopurinol (ZYLOPRIM) 300 MG tablet TAKE 1 TABLET BY MOUTH ONCE DAILY 90 tablet 1  . amLODipine (NORVASC) 10 MG tablet Take 10 mg by mouth daily.  0  . aspirin EC 81 MG tablet Take 81 mg by mouth daily.    . brimonidine (ALPHAGAN) 0.15 % ophthalmic solution Place 1 drop into both eyes 2 (two) times daily.    . Cholecalciferol (VITAMIN D3) 5000 UNITS TABS Take 5,000 Units by mouth daily.    . cloNIDine (CATAPRES - DOSED IN MG/24 HR) 0.1 mg/24hr patch Place 0.1 mg onto the skin once a week.    . clopidogrel (PLAVIX) 75 MG tablet TAKE 1 TABLET BY MOUTH ONCE DAILY FOR HEART STENTS (Patient taking differently: TAKE 1 TABLET (75mg ) BY MOUTH ONCE DAILY FOR HEART STENTS) 90 tablet 1  . dorzolamide (TRUSOPT) 2 % ophthalmic solution Place 1 drop into the right  eye 3 (three) times daily.    . fenofibrate micronized (LOFIBRA) 134 MG capsule TAKE 1 CAPSULE BY MOUTH ONCE DAILY 90 capsule 1  . ferrous sulfate 325 (65 FE) MG tablet Take 325 mg by mouth daily with breakfast.    . furosemide (LASIX) 40 MG tablet Take 40-80 mg by mouth daily. 80mg  in the morning and 40mg  in the evening  4  . hydrALAZINE (APRESOLINE) 50 MG tablet Take 1 tablet (50 mg total) by mouth 2 (two) times daily. 60 tablet 1  . nepafenac (ILEVRO) 0.3 % ophthalmic suspension Place 1 drop into the right eye daily.    . pravastatin (PRAVACHOL) 40 MG tablet TAKE 1 TABLET BY MOUTH AT BEDTIME FOR CHOLESTEROL (Patient taking differently: Take 20 mg by mouth daily. ) 90 tablet 1  . ketorolac (ACULAR) 0.5 % ophthalmic solution INT 1 GTT IN OD QID FOR 5 DAYS     No current facility-administered medications on file prior to visit.    Past Surgical History:  Procedure Laterality Date  . BACK  SURGERY    . CARDIAC CATHETERIZATION     stents  last -07  . CARDIAC STENTS    . CERVICAL DISC SURGERY    . CHOLECYSTECTOMY N/A 09/26/2017   Procedure: LAPAROSCOPIC CHOLECYSTECTOMY;  Surgeon: Rolm Bookbinder, MD;  Location: [facility];  Service: General;  Laterality: N/A;  . ERCP N/A 10/09/2017   Procedure: ENDOSCOPIC RETROGRADE CHOLANGIOPANCREATOGRAPHY (ERCP);  Surgeon: Milus Banister, MD;  Location: [facility] ENDOSCOPY;  Service: Endoscopy;  Laterality: N/A;  . EYE SURGERY Left 06   retinal detachment  . HERNIA REPAIR Right 93  . IR RADIOLOGIST EVAL & MGMT  10/29/2017  . LUMBAR LAMINECTOMY  06/06/2012   Procedure: MICRODISCECTOMY LUMBAR LAMINECTOMY;  Surgeon: Marybelle Killings, MD;  Location: [place] [place] [facility];  Service: Orthopedics;  Laterality: Left;  Left L4-5 Microdiscectomy  . SHOULDER ARTHROSCOPY Right   . TOTAL HIP ARTHROPLASTY Left      Objective: BP (!) 154/68   Pulse 79   Temp 97.8 F (36.6 C)   Ht 6\' 2"  (1.88 m)   Wt 160 lb (72.6 kg)   SpO2 99%   BMI 20.54 kg/m   General appearance: alert, no distress, WD/WN, white male, hard of hearing HEENT: normocephalic, sclerae anicteric, TMs pearly, nares patent, no discharge or erythema, pharynx normal Oral cavity: MMM, no obvious lesions, there is moderate plaque, there is some gingival disease, no obvious abscess Neck: supple, no lymphadenopathy, no thyromegaly, no masses, positive bilateral carotid bruits Heart: 3 out of 6 loud systolic murmur heard throughout , otherwise RRR, normal S1, S2 Lungs: CTA bilaterally, no wheezes, rhonchi, or rales Abdomen: +bs, port scars right mid and upper abdomen, loose skin from weight loss,, soft, non tender, non distended, no masses, no hepatomegaly, no splenomegaly Back nontender, lumbar surgical scar, there is a spinal deformity with a protrusion of the lumbar spine and step-off deformity that is palpable and visual Pulses: 2+ symmetric, upper and lower extremities, normal cap refill No extremity edema  CT  Abdomen pelvis 10/29/2017 Impression: 1. Significant reduction in previously noted perihepatic and gallbladder fossa abscesses following percutaneous drainage catheter placement. No new definable/drainable fluid collections within the abdomen or pelvis. 2. Unchanged positioning of surgically placed drainage catheter. 3. Interval placement of a stent within the distal aspect of the CBD with additional endoscopy clips within the duodenum. 4. Improved aeration of lung bases with persistent small right and trace left-sided effusion and associated bibasilar atelectasis, right greater than  left. 5. Abdominal aortic aneurysm measuring 3.8 cm in maximal diameter. Recommend follow-up aortic ultrasound in 2 years. This recommendation follows ACR consensus guidelines: White Paper of the ACR Incidental Findings Committee II on Vascular Findings. J Am Coll Radiol 2013; 10:789-794. Aortic aneurysm NOS (ICD10-I71.9). 6.  Aortic Atherosclerosis (ICD10-I70.0). 7. Extensive colonic diverticulosis without evidence of diverticulitis.   Assessment: Encounter Diagnoses  Name Primary?  . Abnormal weight loss   . CKD (chronic kidney disease) stage 4, GFR 15-29 ml/min (HCC) Yes  . Idiopathic gout, unspecified chronicity, unspecified site   . Anemia, unspecified type   . Bilateral carotid artery disease, unspecified type ([place])   . Essential hypertension   . Abdominal aortic aneurysm (AAA) without rupture ([place])   . Pain in a tooth or teeth   . Pancreas cyst   . Hearing loss, unspecified hearing loss type, unspecified laterality      Plan: He brought in a statement from his former PCP at [facility] that I reviewed from recent lab work.  It does not have the actual lab result values but rather comments on the lab work.  It notes vitamin D normal, normal lipid panel, mild anemia, continue iron treatment, Covid antibody test negative, arthritis panel normal no inflammation, negative ANA and lupus screen,  sed rate, uric acid levels all normal, rheumatoid factor normal, opposite metabolic panel normal except for creatinine 2.6 moderately increased but stable  I  reviewed a CT abdomen pelvis exam that he had May 2019, results listed above  I reviewed labs from July 2020 in the chart record at that time with a creatinine of 3.24, GFR 17, electrolytes normal, hepatic function showing total protein 5.5, AST 42 elevated otherwise normal hepatic panel  CBC from April 2019 showing hemoglobin of 9.3  Last chest x-ray on file April 2019 showed mild bibasilar subsegmental atelectasis with minimal bilateral pleural effusions   We discussed his concerns of weight loss however his weight loss has been gradual over the past 12 months at least.  We discussed possible causes.  It could be as simple as dental pain leading to some problems with eating or changes in taste buds.  He also has several chronic health issues.  There was mention of a cyst of the pancreas a year ago on CT scan.  We will pursue updated imaging CT chest abdomen pelvis.    He doesn't seem to be depressed, he is eating a consistent amount of food compared to months ago per he and wife's recollection.  He is a nonsmoker.    We discussed that prostate/PSA screening is generally not done at this age, but can be done if other things are ruled out.    He had a colonoscopy and ERCP last year, ERCP 11/2017, colon 02/2018 with numerous biopsies of polyps, pathology reviewed and all benign.  He even reports having a recent Cologard tests that was normal.    We will request recent labs from [facility].      Eyal was seen today for new patient (initial visit) and weight loss.  Diagnoses and all orders for this visit:  CKD (chronic kidney disease) stage 4, GFR 15-29 ml/min (HCC)  Abnormal weight loss -     CT Chest Wo Contrast; Future -     CT Abdomen Pelvis Wo Contrast; Future  Idiopathic gout, unspecified chronicity, unspecified site  Anemia,  unspecified type  Bilateral carotid artery disease, unspecified type ([place])  Essential hypertension  Abdominal aortic aneurysm (AAA) without rupture ([place])  Pain in a tooth or teeth  Pancreas cyst  Hearing loss, unspecified hearing loss type, unspecified laterality  Thank you to [facility] for the kind referral.

## 2019-06-01 NOTE — Patient Instructions (Signed)
It was a pleasure to see you today.  You noted weight loss over the past year or more.  In general we worry more about sudden significant weight loss.   I do see in the chart record where you have lost at least 10 lb in the past year.   Causes of weight loss can include: Poor appetite Depression Chronic health problems Teeth issues/tooth pain Changes in taste buds with age Side effects from medications Cancer/tumor   We will get copies of your recent labs  I reviewed a scan you had a year ago that didn't show worrisome tumor in your abdomen or pelvis.   Recommendations: At this point it may be helpful to repeat a scan, CT scan of chest, abdomen, and pelvis to help evaluated for tumor or cause of weight loss  I recommend you see your dentist to evaluate for teeth pain or other issues that may cause problems  I recommend you do a follow up with [facility] since it has been more than a year.

## 2019-06-04 ENCOUNTER — Other Ambulatory Visit: Payer: Self-pay

## 2019-06-04 ENCOUNTER — Encounter (INDEPENDENT_AMBULATORY_CARE_PROVIDER_SITE_OTHER): Payer: Medicare Other | Admitting: Ophthalmology

## 2019-06-04 DIAGNOSIS — H348122 Central retinal vein occlusion, left eye, stable: Secondary | ICD-10-CM | POA: Diagnosis not present

## 2019-06-04 DIAGNOSIS — H34811 Central retinal vein occlusion, right eye, with macular edema: Secondary | ICD-10-CM | POA: Diagnosis not present

## 2019-06-04 DIAGNOSIS — H35033 Hypertensive retinopathy, bilateral: Secondary | ICD-10-CM | POA: Diagnosis not present

## 2019-06-04 DIAGNOSIS — H43813 Vitreous degeneration, bilateral: Secondary | ICD-10-CM

## 2019-06-04 DIAGNOSIS — I10 Essential (primary) hypertension: Secondary | ICD-10-CM | POA: Diagnosis not present

## 2019-06-04 DIAGNOSIS — H34231 Retinal artery branch occlusion, right eye: Secondary | ICD-10-CM

## 2019-06-09 ENCOUNTER — Other Ambulatory Visit: Payer: Self-pay

## 2019-06-09 ENCOUNTER — Ambulatory Visit (INDEPENDENT_AMBULATORY_CARE_PROVIDER_SITE_OTHER): Payer: Medicare Other | Admitting: Pharmacist Clinician (PhC)/ Clinical Pharmacy Specialist

## 2019-06-09 ENCOUNTER — Encounter: Payer: Self-pay | Admitting: Pharmacist Clinician (PhC)/ Clinical Pharmacy Specialist

## 2019-06-09 DIAGNOSIS — I10 Essential (primary) hypertension: Secondary | ICD-10-CM

## 2019-06-09 MED ORDER — HYDRALAZINE HCL 50 MG PO TABS: 50.0000 mg | ORAL_TABLET | Freq: Three times a day (TID) | ORAL | 3 refills | Status: DC

## 2019-06-09 NOTE — Patient Instructions (Signed)
Return for a a follow up appointment with Kerin Ransom in January  Your blood pressure today is 146/62  Check your blood pressure at home daily and keep record of the readings.  Take your BP meds as follows:  Increase hydralazine to three times per day (morning, mid-day and bedtime)  Continue with all other medications  Bring all of your meds, your BP cuff and your record of home blood pressures to your next appointment.  Exercise as you're able, try to walk approximately 30 minutes per day.  Keep salt intake to a minimum, especially watch canned and prepared boxed foods.  Eat more fresh fruits and vegetables and fewer canned items.  Avoid eating in fast food restaurants.    HOW TO TAKE YOUR BLOOD PRESSURE: . Rest 5 minutes before taking your blood pressure. .  Don't smoke or drink caffeinated beverages for at least 30 minutes before. . Take your blood pressure before (not after) you eat. . Sit comfortably with your back supported and both feet on the floor (don't cross your legs). . Elevate your arm to heart level on a table or a desk. . Use the proper sized cuff. It should fit smoothly and snugly around your bare upper arm. There should be enough room to slip a fingertip under the cuff. The bottom edge of the cuff should be 1 inch above the crease of the elbow. . Ideally, take 3 measurements at one sitting and record the average.

## 2019-06-09 NOTE — Progress Notes (Signed)
HPI:  Terry Garner is a 78 y.o. male patient of Dr [name], with a PMH below who presents today for hypertension clinic follow up.   In addition to hypertension, his medical history is significant for CAD (s/p LAD and diagonal branch stenting), RBBB, hyperlipidemia, renal artery stenosis and CKD (SCr 3.24).  He has no complaints of CP or SOB.  He does note some dizziness upon arising in the mornings, but does well if he moves slowly.  He also endorses some lower extremity edema, states it usually goes away when he takes the furosemide and/or elevates his legs.  He reports very minimal change in BP readings with hydralazine 25mg  but denies ADR or compliance issues.  Blood Pressure Goal:  130/80  Current Medications: amlodipine 10 mg qd (am) clonidine 0.1 mg patch (Saturdays) hydralazine 50 mg twice daily (7am& 7pm)  Furosemide 80 mg in AM and 40mg  in PM  Family Hx: paternal family with history of strokes, brother with hypertension, sister with MI; son with hypertension  Social Hx: no tobacco, no alcohol, occasional coffee and pepsi  Diet: drinking 2 ensure per day, rarely eats restaurant food.  Wife concerned about ongoing weight loss and lack of appetite.    Exercise:  Stays active tinkering with Conservation officer, nature, shop or yard; eyesight diminishing, so doing less  Home BP readings:  161/72 pulse 57  Intolerances: avoid all diuretics, ACEI/ARB due to kidney function  Avoid beta blockers due to bradycardia  Labs:  12/2018:  Na 144, K 4.7, Glu 88, BUN 48, SCr 3.24 GFR 17 CrCl 18.5  Wt Readings from Last 3 Encounters:  06/09/19 160 lb 6.4 oz (72.8 kg)  06/01/19 160 lb (72.6 kg)  04/14/19 165 lb 6.4 oz (75 kg)   BP Readings from Last 3 Encounters:  06/09/19 (!) 146/62  06/01/19 (!) 154/68  04/14/19 (!) 158/58   Pulse Readings from Last 3 Encounters:  06/09/19 (!) 56  06/01/19 79  04/14/19 (!) 52    Current Outpatient Medications  Medication Sig Dispense Refill  .  allopurinol (ZYLOPRIM) 300 MG tablet TAKE 1 TABLET BY MOUTH ONCE DAILY 90 tablet 1  . amLODipine (NORVASC) 10 MG tablet Take 10 mg by mouth daily.  0  . aspirin EC 81 MG tablet Take 81 mg by mouth daily.    . brimonidine (ALPHAGAN) 0.15 % ophthalmic solution Place 1 drop into both eyes 2 (two) times daily.    . Cholecalciferol (VITAMIN D3) 5000 UNITS TABS Take 5,000 Units by mouth daily.    . cloNIDine (CATAPRES - DOSED IN MG/24 HR) 0.1 mg/24hr patch Place 0.1 mg onto the skin once a week.    . clopidogrel (PLAVIX) 75 MG tablet TAKE 1 TABLET BY MOUTH ONCE DAILY FOR HEART STENTS (Patient taking differently: TAKE 1 TABLET (75mg ) BY MOUTH ONCE DAILY FOR HEART STENTS) 90 tablet 1  . dorzolamide (TRUSOPT) 2 % ophthalmic solution Place 1 drop into the right eye 3 (three) times daily.    . fenofibrate micronized (LOFIBRA) 134 MG capsule TAKE 1 CAPSULE BY MOUTH ONCE DAILY 90 capsule 1  . ferrous sulfate 325 (65 FE) MG tablet Take 325 mg by mouth daily with breakfast.    . furosemide (LASIX) 40 MG tablet Take 40-80 mg by mouth daily. 80mg  in the morning and 40mg  in the evening  4  . ketorolac (ACULAR) 0.5 % ophthalmic solution Uses after injections in eye    . nepafenac (ILEVRO) 0.3 % ophthalmic suspension Place  1 drop into the right eye daily.    . pravastatin (PRAVACHOL) 40 MG tablet TAKE 1 TABLET BY MOUTH AT BEDTIME FOR CHOLESTEROL (Patient taking differently: Take 20 mg by mouth daily. ) 90 tablet 1  . hydrALAZINE (APRESOLINE) 50 MG tablet Take 1 tablet (50 mg total) by mouth 3 (three) times daily. 270 tablet 3   No current facility-administered medications for this visit.    Allergies  Allergen Reactions  . Toprol Xl [Metoprolol Tartrate] Other (See Comments)    Bradycardia with beta blockers  . Lipitor [Atorvastatin]     Myalgias   . Coumadin [Warfarin Sodium] Rash  . Tape Rash    Clear tape  . Warfarin Rash    Past Medical History:  Diagnosis Date  . AAA (abdominal aortic aneurysm)  (HCC)    3.6 cm (01/09/12 ultrasound)  . Allergy   . Arthritis   . Bile duct leak 09/2017   post op  . Carotid artery disease ([place])   . Cataract   . Chronic kidney disease    CKD, right renal artery stenosis  . Coronary artery disease    mild left internal carotid artery stenosis  . Glaucoma   . Gout   . Heart murmur   . Hyperlipidemia   . Hypertension   . Tubular adenoma of colon 2003   Dr. Watt Climes  . Vitamin D deficiency     Blood pressure (!) 146/62, pulse (!) 56, height 6\' 2"  (1.88 m), weight 160 lb 6.4 oz (72.8 kg).    Essential hypertension Patient with essential hypertension, improving with increase in hydralazine doses, but still not to goal.  Will have him increase hydralazine dose again, now to 50 mg tid.  Reviewed with patient and wife how to take mid-day dose consistently, recommend that he take with his mid-day meal.  He should continue with his other medications and will follow up with Kerin Ransom in [place].  We can see him again in 2021 should he need further monitoring.    Tommy Medal PharmD CPP [facility] [address] [place] [address] 06/09/2019 8:29 AM

## 2019-06-09 NOTE — Assessment & Plan Note (Signed)
Patient with essential hypertension, improving with increase in hydralazine doses, but still not to goal.  Will have him increase hydralazine dose again, now to 50 mg tid.  Reviewed with patient and wife how to take mid-day dose consistently, recommend that he take with his mid-day meal.  He should continue with his other medications and will follow up with Kerin Ransom in [place].  We can see him again in 2021 should he need further monitoring.

## 2019-06-12 ENCOUNTER — Other Ambulatory Visit: Payer: Medicare Other

## 2019-06-29 ENCOUNTER — Telehealth: Payer: Self-pay

## 2019-06-29 NOTE — Telephone Encounter (Signed)
Contacted patient to offer a virtual appointment. Patient was agreeable and appt changed.       Virtual Visit Pre-Appointment Phone Call  "[name], I am calling you today to discuss your upcoming appointment. We are currently trying to limit exposure to the virus that causes COVID-19 by seeing patients at home rather than in the office."  1. "What is the BEST phone number to call the day of the visit?" - include this in appointment notes  2. "Do you have or have access to (through a family member/friend) a smartphone with video capability that we can use for your visit?" a. If yes - list this number in appt notes as "cell" (if different from BEST phone #) and list the appointment type as a VIDEO visit in appointment notes b. If no - list the appointment type as a PHONE visit in appointment notes  3. Confirm consent - "In the setting of the current Covid19 crisis, you are scheduled for a (phone or video) visit with your provider on (date) at (time).  Just as we do with many in-office visits, in order for you to participate in this visit, we must obtain consent.  If you'd like, I can send this to your mychart (if signed up) or email for you to review.  Otherwise, I can obtain your verbal consent now.  All virtual visits are billed to your insurance company just like a normal visit would be.  By agreeing to a virtual visit, we'd like you to understand that the technology does not allow for your provider to perform an examination, and thus may limit your provider's ability to fully assess your condition. If your provider identifies any concerns that need to be evaluated in person, we will make arrangements to do so.  Finally, though the technology is pretty good, we cannot assure that it will always work on either your or our end, and in the setting of a video visit, we may have to convert it to a phone-only visit.  In either situation, we cannot ensure that we have a secure connection.  Are  you willing to proceed?" STAFF: Did the patient verbally acknowledge consent to telehealth visit? Document YES/NO here: YES  4. Advise patient to be prepared - "Two hours prior to your appointment, go ahead and check your blood pressure, pulse, oxygen saturation, and your weight (if you have the equipment to check those) and write them all down. When your visit starts, your provider will ask you for this information. If you have an Apple Watch or Kardia device, please plan to have heart rate information ready on the day of your appointment. Please have a pen and paper handy nearby the day of the visit as well."  5. Give patient instructions for MyChart download to smartphone OR Doximity/Doxy.me as below if video visit (depending on what platform provider is using)  6. Inform patient they will receive a phone call 15 minutes prior to their appointment time (may be from unknown caller ID) so they should be prepared to answer    Terry Garner has been deemed a candidate for a follow-up tele-health visit to limit community exposure during the Covid-19 pandemic. I spoke with the patient via phone to ensure availability of phone/video source, confirm preferred email & phone number, and discuss instructions and expectations.  I reminded Terry Garner to be prepared with any vital sign and/or heart rhythm information that could potentially be obtained via home  monitoring, at the time of his visit. I reminded Terry Garner to expect a phone call prior to his visit.  Harold Hedge, [place] 06/29/2019 10:19 AM   INSTRUCTIONS FOR DOWNLOADING THE MYCHART APP TO SMARTPHONE  - The patient must first make sure to have activated MyChart and know their login information - If Apple, go to [employer] and type in MyChart in the search bar and download the app. If Android, ask patient to go to [employer] and type in [place] in the search bar and download the app. The app is free but as  with any other app downloads, their phone may require them to verify saved payment information or Apple/Android password.  - The patient will need to then log into the app with their MyChart username and password, and select [name] as their healthcare provider to link the account. When it is time for your visit, go to the MyChart app, find appointments, and click Begin Video Visit. Be sure to Select Allow for your device to access the Microphone and Camera for your visit. You will then be connected, and your provider will be with you shortly.  **If they have any issues connecting, or need assistance please contact MyChart service desk [phone])**  **If using a computer, in order to ensure the best quality for their visit they will need to use either of the following Internet Browsers: [employer], or Google Chrome**  IF USING DOXIMITY or DOXY.ME - The patient will receive a link just prior to their visit by text.     FULL LENGTH CONSENT FOR TELE-HEALTH VISIT   I hereby voluntarily request, consent and authorize [facility] and its employed or contracted physicians, physician assistants, nurse practitioners or other licensed health care professionals (the Practitioner), to provide me with telemedicine health care services (the "Services") as deemed necessary by the treating Practitioner. I acknowledge and consent to receive the Services by the Practitioner via telemedicine. I understand that the telemedicine visit will involve communicating with the Practitioner through live audiovisual communication technology and the disclosure of certain medical information by electronic transmission. I acknowledge that I have been given the opportunity to request an in-person assessment or other available alternative prior to the telemedicine visit and am voluntarily participating in the telemedicine visit.  I understand that I have the right to withhold or withdraw my consent to the  use of telemedicine in the course of my care at any time, without affecting my right to future care or treatment, and that the Practitioner or I may terminate the telemedicine visit at any time. I understand that I have the right to inspect all information obtained and/or recorded in the course of the telemedicine visit and may receive copies of available information for a reasonable fee.  I understand that some of the potential risks of receiving the Services via telemedicine include:  [name] Delay or interruption in medical evaluation due to technological equipment failure or disruption; . Information transmitted may not be sufficient (e.g. poor resolution of images) to allow for appropriate medical decision making by the Practitioner; and/or  . In rare instances, security protocols could fail, causing a breach of personal health information.  Furthermore, I acknowledge that it is my responsibility to provide information about my medical history, conditions and care that is complete and accurate to the best of my ability. I acknowledge that Practitioner's advice, recommendations, and/or decision may be based on factors not within their control, such as incomplete  or inaccurate data provided by me or distortions of diagnostic images or specimens that may result from electronic transmissions. I understand that the practice of medicine is not an exact science and that Practitioner makes no warranties or guarantees regarding treatment outcomes. I acknowledge that I will receive a copy of this consent concurrently upon execution via email to the email address I last provided but may also request a printed copy by calling the office of [facility].    I understand that my insurance will be billed for this visit.   I have read or had this consent read to me. . I understand the contents of this consent, which adequately explains the benefits and risks of the Services being provided via telemedicine.  . I have  been provided ample opportunity to ask questions regarding this consent and the Services and have had my questions answered to my satisfaction. . I give my informed consent for the services to be provided through the use of telemedicine in my medical care  By participating in this telemedicine visit I agree to the above.

## 2019-06-30 ENCOUNTER — Other Ambulatory Visit: Payer: Self-pay

## 2019-06-30 ENCOUNTER — Ambulatory Visit (HOSPITAL_COMMUNITY)
Admission: RE | Admit: 2019-06-30 | Discharge: 2019-06-30 | Disposition: A | Discharge status: Home / Self Care | Payer: Medicare Other | Source: Ambulatory Visit | Attending: Cardiology | Admitting: Cardiology

## 2019-06-30 ENCOUNTER — Other Ambulatory Visit (HOSPITAL_COMMUNITY): Payer: Self-pay | Admitting: Cardiovascular Disease

## 2019-06-30 DIAGNOSIS — I701 Atherosclerosis of renal artery: Secondary | ICD-10-CM

## 2019-07-01 DIAGNOSIS — I701 Atherosclerosis of renal artery: Secondary | ICD-10-CM

## 2019-07-02 ENCOUNTER — Inpatient Hospital Stay (HOSPITAL_COMMUNITY): Admission: RE | Admit: 2019-07-02 | Payer: Medicare Other | Source: Ambulatory Visit

## 2019-07-02 ENCOUNTER — Other Ambulatory Visit (HOSPITAL_COMMUNITY): Payer: Medicare Other

## 2019-07-07 ENCOUNTER — Encounter: Payer: Self-pay | Admitting: Cardiology

## 2019-07-07 ENCOUNTER — Telehealth (INDEPENDENT_AMBULATORY_CARE_PROVIDER_SITE_OTHER): Payer: Medicare Other | Admitting: Cardiology

## 2019-07-07 VITALS — BP 149/68 | HR 57 | Ht 75.0 in | Wt 159.0 lb

## 2019-07-07 DIAGNOSIS — I25709 Atherosclerosis of coronary artery bypass graft(s), unspecified, with unspecified angina pectoris: Secondary | ICD-10-CM

## 2019-07-07 DIAGNOSIS — I10 Essential (primary) hypertension: Secondary | ICD-10-CM

## 2019-07-07 DIAGNOSIS — I701 Atherosclerosis of renal artery: Secondary | ICD-10-CM

## 2019-07-07 DIAGNOSIS — N184 Chronic kidney disease, stage 4 (severe): Secondary | ICD-10-CM

## 2019-07-07 NOTE — Progress Notes (Signed)
Virtual Visit via Telephone Note   This visit type was conducted due to national recommendations for restrictions regarding the COVID-19 Pandemic (e.g. social distancing) in an effort to limit this patient's exposure and mitigate transmission in our community.  Due to his co-morbid illnesses, this patient is at least at moderate risk for complications without adequate follow up.  This format is felt to be most appropriate for this patient at this time.  The patient did not have access to video technology/had technical difficulties with video requiring transitioning to audio format only (telephone).  All issues noted in this document were discussed and addressed.  No physical exam could be performed with this format.  Please refer to the patient's chart for his  consent to telehealth for [facility].   Date:  07/07/2019   ID:  Terry Garner, DOB [birth year], MRN [id number]  Patient Location: Home Provider Location: Home  PCP:  Carlena Hurl, PA-C  Cardiologist:  Quay Burow, MD  Electrophysiologist:  None   Evaluation Performed:  Follow-Up Visit  Chief Complaint:  none  History of Present Illness:    Terry Garner is a 79 y.o. male with history of remote coronary disease, status post PCI in 2006, 2007, 2008 of the diagonal.  Cath done in 2010 showed patent coronaries, this was after a false positive Myoview.  Other medical issues include chronic right bundle branch block, dyslipidemia, hypertension which has been difficult to control, renal artery stenosis, and chronic renal insufficiency stage IV.  The patient had renal artery Dopplers in March 2020.  He has been followed in our hypertension clinic.  Because of his chronic renal insufficiency and baseline bradycardia our options for medical therapy have been limited.  He was contacted today for follow-up after he had renal artery Dopplers last week.  Unfortunately there is no report in the chart and I have no access to that  study.  Symptomatically the patient says he is doing well.  He does note that his blood pressure remains somewhat difficult to control with a systolic blood pressure in the 145-148 range.  I do not see any new labs since July 2020 when his creatinine was 3.24.  The patient does not have symptoms concerning for COVID-19 infection (fever, chills, cough, or new shortness of breath).    Past Medical History:  Diagnosis Date  . AAA (abdominal aortic aneurysm) (HCC)    3.6 cm (01/09/12 ultrasound)  . Allergy   . Arthritis   . Bile duct leak 09/2017   post op  . Carotid artery disease ([place])   . Cataract   . Chronic kidney disease    CKD, right renal artery stenosis  . Coronary artery disease    mild left internal carotid artery stenosis  . Glaucoma   . Gout   . Heart murmur   . Hyperlipidemia   . Hypertension   . Tubular adenoma of colon 2003   Dr. Watt Climes  . Vitamin D deficiency    Past Surgical History:  Procedure Laterality Date  . BACK SURGERY    . CARDIAC CATHETERIZATION     stents  last -07  . CARDIAC STENTS    . CERVICAL DISC SURGERY    . CHOLECYSTECTOMY N/A 09/26/2017   Procedure: LAPAROSCOPIC CHOLECYSTECTOMY;  Surgeon: Rolm Bookbinder, MD;  Location: [facility];  Service: General;  Laterality: N/A;  . ERCP N/A 10/09/2017   Procedure: ENDOSCOPIC RETROGRADE CHOLANGIOPANCREATOGRAPHY (ERCP);  Surgeon: Milus Banister, MD;  Location: [facility] ENDOSCOPY;  Service: Endoscopy;  Laterality: N/A;  . EYE SURGERY Left 06   retinal detachment  . HERNIA REPAIR Right 93  . IR RADIOLOGIST EVAL & MGMT  10/29/2017  . LUMBAR LAMINECTOMY  06/06/2012   Procedure: MICRODISCECTOMY LUMBAR LAMINECTOMY;  Surgeon: Marybelle Killings, MD;  Location: [facility];  Service: Orthopedics;  Laterality: Left;  Left L4-5 Microdiscectomy  . SHOULDER ARTHROSCOPY Right   . TOTAL HIP ARTHROPLASTY Left      Current Meds  Medication Sig  . allopurinol (ZYLOPRIM) 300 MG tablet TAKE 1 TABLET BY MOUTH ONCE DAILY  . amLODipine  (NORVASC) 10 MG tablet Take 10 mg by mouth daily.  [name] aspirin EC 81 MG tablet Take 81 mg by mouth daily.  . brimonidine (ALPHAGAN) 0.15 % ophthalmic solution Place 1 drop into both eyes 2 (two) times daily.  . Cholecalciferol (VITAMIN D3) 5000 UNITS TABS Take 5,000 Units by mouth daily.  . cloNIDine (CATAPRES - DOSED IN MG/24 HR) 0.1 mg/24hr patch Place 0.1 mg onto the skin once a week.  . clopidogrel (PLAVIX) 75 MG tablet TAKE 1 TABLET BY MOUTH ONCE DAILY FOR HEART STENTS (Patient taking differently: TAKE 1 TABLET (75mg ) BY MOUTH ONCE DAILY FOR HEART STENTS)  . dorzolamide (TRUSOPT) 2 % ophthalmic solution Place 1 drop into the right eye 3 (three) times daily.  . fenofibrate micronized (LOFIBRA) 134 MG capsule TAKE 1 CAPSULE BY MOUTH ONCE DAILY  . ferrous sulfate 325 (65 FE) MG tablet Take 325 mg by mouth daily with breakfast.  . furosemide (LASIX) 40 MG tablet Take 40-80 mg by mouth daily. 80mg  in the morning and 40mg  in the evening  . hydrALAZINE (APRESOLINE) 50 MG tablet Take 1 tablet (50 mg total) by mouth 3 (three) times daily.  [name] ketorolac (ACULAR) 0.5 % ophthalmic solution Uses after injections in eye  . nepafenac (ILEVRO) 0.3 % ophthalmic suspension Place 1 drop into the right eye daily.  . pravastatin (PRAVACHOL) 40 MG tablet TAKE 1 TABLET BY MOUTH AT BEDTIME FOR CHOLESTEROL     Allergies:   Toprol xl [metoprolol tartrate], Lipitor [atorvastatin], Coumadin [warfarin sodium], Tape, and Warfarin   Social History   Tobacco Use  . Smoking status: Never Smoker  . Smokeless tobacco: Never Used  Substance Use Topics  . Alcohol use: No  . Drug use: No     Family Hx: The patient's family history includes Diabetes in his brother; Heart disease in his brother, father, mother, and sister; Other in his brother; Stroke in his father. There is no history of Colon cancer, Esophageal cancer, Stomach cancer, or Rectal cancer.  ROS:   Please see the history of present illness.    All other  systems reviewed and are negative.   Prior CV studies:   The following studies were reviewed today:    Labs/Other Tests and Data Reviewed:    EKG:  No ECG reviewed.  Recent Labs: 01/09/2019: ALT 16; BUN 48; Creatinine, Ser 3.24; Potassium 4.7; Sodium 144   Recent Lipid Panel Lab Results  Component Value Date/Time   CHOL 108 01/09/2019 09:16 AM   TRIG 112 01/09/2019 09:16 AM   HDL 29 (L) 01/09/2019 09:16 AM   CHOLHDL 3.7 01/09/2019 09:16 AM   CHOLHDL 4.7 07/15/2017 02:05 PM   LDLCALC 57 01/09/2019 09:16 AM   LDLCALC 73 07/15/2017 02:05 PM    Wt Readings from Last 3 Encounters:  07/07/19 159 lb (72.1 kg)  06/09/19 160 lb 6.4 oz (72.8 kg)  06/01/19 160 lb (72.6  kg)     Objective:    Vital Signs:  BP (!) 149/68   Pulse (!) 57   Ht 6\' 3"  (1.905 m)   Wt 159 lb (72.1 kg)   BMI 19.87 kg/m    VITAL SIGNS:  reviewed  ASSESSMENT & PLAN:    Uncontrolled HTN- Medical Rx limited by CRI and bradycardia  RAS- RA dopplers done last week- those results are not in the chart at this time- I'll review with dr [name] and get back in touch with the patient.  CRI-4 Last labs July- SCr was 3.24, GFR 17.  CAD- Remote PCI- stable from this standpoint  Plan: Will review with Dr [name]- He appears to be headed towards renal failure unless Dr [name] feels he is a candidate for RA PCI.  I don't think he sees a nephrologist currently but he will need one.  He has not had any recent labs.  I did not charge him for today's visit and will get back to him after Dr [name] reviews his dopplers.   COVID-19 Education: The signs and symptoms of COVID-19 were discussed with the patient and how to seek care for testing (follow up with PCP or arrange E-visit).  The importance of social distancing was discussed today.  Time:   Today, I have spent 10 minutes with the patient with telehealth technology discussing the above problems.     Medication Adjustments/Labs and Tests Ordered: Current medicines  are reviewed at length with the patient today.  Concerns regarding medicines are outlined above.   Tests Ordered: No orders of the defined types were placed in this encounter.   Medication Changes: No orders of the defined types were placed in this encounter.   Follow Up:  To be determined .  Angelena Form, PA-C  07/07/2019 8:52 AM    [facility]

## 2019-07-08 ENCOUNTER — Ambulatory Visit: Payer: Medicare Other | Admitting: Cardiology

## 2019-07-10 ENCOUNTER — Telehealth: Payer: Self-pay | Admitting: Medical

## 2019-07-10 NOTE — Telephone Encounter (Signed)
Pts wife dropped off denial letter of ct. Letter given to [facility]

## 2019-07-14 ENCOUNTER — Ambulatory Visit: Payer: Medicare Other | Attending: Internal Medicine

## 2019-07-14 DIAGNOSIS — Z23 Encounter for immunization: Secondary | ICD-10-CM

## 2019-07-14 NOTE — Progress Notes (Signed)
   Covid-19 Vaccination Clinic  Name:  Terry Garner    MRN: [id number] DOB: 09/13/1940  07/14/2019  Terry Garner was observed post Covid-19 immunization for 15 minutes without incidence. He was provided with Vaccine Information Sheet and instruction to access the V-Safe system.   Terry Garner was instructed to call 911 with any severe reactions post vaccine: [name] Difficulty breathing  . Swelling of your face and throat  . A fast heartbeat  . A bad rash all over your body  . Dizziness and weakness    Immunizations Administered    Name Date Dose VIS Date Route   Pfizer COVID-19 Vaccine 07/14/2019 12:12 PM 0.3 mL 06/05/2019 Intramuscular   Manufacturer: [place]   Lot: [id number]   [place]: [id number]

## 2019-07-30 ENCOUNTER — Encounter (INDEPENDENT_AMBULATORY_CARE_PROVIDER_SITE_OTHER): Payer: Medicare Other | Admitting: Ophthalmology

## 2019-08-01 ENCOUNTER — Ambulatory Visit: Payer: Medicare Other | Attending: Internal Medicine

## 2019-08-01 DIAGNOSIS — Z23 Encounter for immunization: Secondary | ICD-10-CM | POA: Insufficient documentation

## 2019-08-01 NOTE — Progress Notes (Signed)
   Covid-19 Vaccination Clinic  Name:  Terry Garner    MRN: [id number] DOB: August 15, 1940  08/01/2019  [name] was observed post Covid-19 immunization for 15 minutes without incidence. He was provided with Vaccine Information Sheet and instruction to access the V-Safe system.   [name] was instructed to call 911 with any severe reactions post vaccine: [name] Difficulty breathing  . Swelling of your face and throat  . A fast heartbeat  . A bad rash all over your body  . Dizziness and weakness    Immunizations Administered    Name Date Dose VIS Date Route   Pfizer COVID-19 Vaccine 08/01/2019 11:18 AM 0.3 mL 06/05/2019 Intramuscular   Manufacturer: [place]   Lot: [id number]   [place]: [id number]

## 2019-08-03 ENCOUNTER — Ambulatory Visit: Payer: Medicare Other

## 2019-08-04 ENCOUNTER — Ambulatory Visit: Payer: Medicare Other | Admitting: Podiatry

## 2019-08-17 ENCOUNTER — Encounter (INDEPENDENT_AMBULATORY_CARE_PROVIDER_SITE_OTHER): Payer: Medicare Other | Admitting: Ophthalmology

## 2019-08-17 ENCOUNTER — Other Ambulatory Visit: Payer: Self-pay

## 2019-08-17 DIAGNOSIS — I10 Essential (primary) hypertension: Secondary | ICD-10-CM | POA: Diagnosis not present

## 2019-08-17 DIAGNOSIS — H348122 Central retinal vein occlusion, left eye, stable: Secondary | ICD-10-CM | POA: Diagnosis not present

## 2019-08-17 DIAGNOSIS — H43813 Vitreous degeneration, bilateral: Secondary | ICD-10-CM

## 2019-08-17 DIAGNOSIS — H34811 Central retinal vein occlusion, right eye, with macular edema: Secondary | ICD-10-CM | POA: Diagnosis not present

## 2019-08-17 DIAGNOSIS — H35033 Hypertensive retinopathy, bilateral: Secondary | ICD-10-CM | POA: Diagnosis not present

## 2019-09-08 ENCOUNTER — Encounter: Payer: Self-pay | Admitting: Medical

## 2019-09-17 ENCOUNTER — Encounter (INDEPENDENT_AMBULATORY_CARE_PROVIDER_SITE_OTHER): Payer: Medicare Other | Admitting: Ophthalmology

## 2019-09-17 DIAGNOSIS — H34811 Central retinal vein occlusion, right eye, with macular edema: Secondary | ICD-10-CM

## 2019-09-17 DIAGNOSIS — H348122 Central retinal vein occlusion, left eye, stable: Secondary | ICD-10-CM

## 2019-09-17 DIAGNOSIS — H35033 Hypertensive retinopathy, bilateral: Secondary | ICD-10-CM | POA: Diagnosis not present

## 2019-09-17 DIAGNOSIS — I10 Essential (primary) hypertension: Secondary | ICD-10-CM

## 2019-09-17 DIAGNOSIS — H43813 Vitreous degeneration, bilateral: Secondary | ICD-10-CM

## 2019-09-17 DIAGNOSIS — H34231 Retinal artery branch occlusion, right eye: Secondary | ICD-10-CM

## 2019-09-29 ENCOUNTER — Encounter: Payer: Self-pay | Admitting: Medical

## 2019-10-29 ENCOUNTER — Encounter (INDEPENDENT_AMBULATORY_CARE_PROVIDER_SITE_OTHER): Payer: Medicare Other | Admitting: Ophthalmology

## 2019-10-29 DIAGNOSIS — I10 Essential (primary) hypertension: Secondary | ICD-10-CM | POA: Diagnosis not present

## 2019-10-29 DIAGNOSIS — H35033 Hypertensive retinopathy, bilateral: Secondary | ICD-10-CM | POA: Diagnosis not present

## 2019-10-29 DIAGNOSIS — H348122 Central retinal vein occlusion, left eye, stable: Secondary | ICD-10-CM

## 2019-10-29 DIAGNOSIS — H43813 Vitreous degeneration, bilateral: Secondary | ICD-10-CM

## 2019-10-29 DIAGNOSIS — H34811 Central retinal vein occlusion, right eye, with macular edema: Secondary | ICD-10-CM | POA: Diagnosis not present

## 2019-11-09 ENCOUNTER — Telehealth: Payer: Self-pay | Admitting: Cardiovascular Disease

## 2019-11-09 NOTE — Telephone Encounter (Signed)
Patient's wife would like to come with him to his upcoming appt as she is the one that takes care of his medical needs.

## 2019-11-09 NOTE — Telephone Encounter (Signed)
The wife has been made aware that she may come to the appointment but they must wear masks.

## 2019-11-13 ENCOUNTER — Encounter: Payer: Self-pay | Admitting: Cardiovascular Disease

## 2019-11-13 ENCOUNTER — Telehealth: Payer: Self-pay | Admitting: Radiology

## 2019-11-13 ENCOUNTER — Ambulatory Visit: Payer: Medicare Other | Admitting: Cardiovascular Disease

## 2019-11-13 ENCOUNTER — Other Ambulatory Visit: Payer: Self-pay

## 2019-11-13 VITALS — BP 170/64 | HR 58 | Ht 74.0 in | Wt 166.2 lb

## 2019-11-13 DIAGNOSIS — I714 Abdominal aortic aneurysm, without rupture, unspecified: Secondary | ICD-10-CM

## 2019-11-13 DIAGNOSIS — R001 Bradycardia, unspecified: Secondary | ICD-10-CM

## 2019-11-13 DIAGNOSIS — I779 Disorder of arteries and arterioles, unspecified: Secondary | ICD-10-CM | POA: Diagnosis not present

## 2019-11-13 DIAGNOSIS — I25708 Atherosclerosis of coronary artery bypass graft(s), unspecified, with other forms of angina pectoris: Secondary | ICD-10-CM | POA: Diagnosis not present

## 2019-11-13 DIAGNOSIS — I4510 Unspecified right bundle-branch block: Secondary | ICD-10-CM | POA: Diagnosis not present

## 2019-11-13 DIAGNOSIS — I10 Essential (primary) hypertension: Secondary | ICD-10-CM | POA: Diagnosis not present

## 2019-11-13 DIAGNOSIS — E782 Mixed hyperlipidemia: Secondary | ICD-10-CM

## 2019-11-13 LAB — BASIC METABOLIC PANEL
BUN/Creatinine Ratio: 19 (ref 10–24)
BUN: 62 mg/dL — ABNORMAL HIGH (ref 8–27)
CO2: 24 mmol/L (ref 20–29)
Calcium: 8.6 mg/dL (ref 8.6–10.2)
Chloride: 106 mmol/L (ref 96–106)
Creatinine, Ser: 3.34 mg/dL — ABNORMAL HIGH (ref 0.76–1.27)
GFR calc Af Amer: 19 mL/min/{1.73_m2} — ABNORMAL LOW (ref >59)
GFR calc non Af Amer: 16 mL/min/{1.73_m2} — ABNORMAL LOW (ref >59)
Glucose: 86 mg/dL (ref 65–99)
Potassium: 4.3 mmol/L (ref 3.5–5.2)
Sodium: 141 mmol/L (ref 134–144)

## 2019-11-13 LAB — HEPATIC FUNCTION PANEL
ALT: 15 IU/L (ref 0–44)
AST: 42 IU/L — ABNORMAL HIGH (ref 0–40)
Albumin: 3.8 g/dL (ref 3.6–4.6)
Alkaline Phosphatase: 51 IU/L (ref 48–121)
Bilirubin Total: 0.8 mg/dL (ref 0.0–1.2)
Bilirubin, Direct: 0.43 mg/dL — ABNORMAL HIGH (ref 0.00–0.40)
Total Protein: 5.6 g/dL — ABNORMAL LOW (ref 6.0–8.5)

## 2019-11-13 LAB — LIPID PANEL
Chol/HDL Ratio: 3.9 ratio (ref 0.0–5.0)
Cholesterol, Total: 98 mg/dL — ABNORMAL LOW (ref 100–199)
HDL: 25 mg/dL — ABNORMAL LOW (ref >39)
LDL Chol Calc (NIH): 54 mg/dL (ref 0–99)
Triglycerides: 97 mg/dL (ref 0–149)
VLDL Cholesterol Cal: 19 mg/dL (ref 5–40)

## 2019-11-13 MED ORDER — ISOSORBIDE MONONITRATE ER 30 MG PO TB24: 15.0000 mg | ORAL_TABLET | Freq: Every day | ORAL | 2 refills | Status: DC

## 2019-11-13 NOTE — Assessment & Plan Note (Signed)
Chronic. 

## 2019-11-13 NOTE — Assessment & Plan Note (Signed)
History of CAD status post LAD and diagonal branch stenting back in 2006, 2007 and 2008.  His last catheterization performed Dr. [name] 06/09/2009 revealed widely patent stents after a false positive Myoview.  He has had increasing exertional chest pain over the last several months.  I am going to get a Lexiscan Myoview stress test to further evaluate

## 2019-11-13 NOTE — Assessment & Plan Note (Signed)
History of hyperlipidemia on statin therapy and fenofibrate with lipid profile performed 01/09/2019 revealing a total cholesterol of 108, LDL 73 and HDL 29.

## 2019-11-13 NOTE — Assessment & Plan Note (Signed)
History of uncontrolled hypertension with blood pressure measured today at 170/64.  He was seeing our pharmacist for medicine titration.  He is on amlodipine, and hydralazine 3 times daily.  He is bradycardic and cannot be put on a beta-blocker and his chronic renal sufficiency and therefore cannot be put on ACE/ARB.  He does have renal artery stenosis which may be contributory.

## 2019-11-13 NOTE — Progress Notes (Signed)
11/13/2019 Terry Garner   1941-01-22  [id number]  Primary Physician Tysinger, Camelia Eng, PA-C Primary Cardiologist: [name] MD [name], [place]  HPI:  Terry Garner is a 79 y.o.  moderately overweight married Caucasian male father of one child formally a patient of Dr. Carlean Jews. [facility]. I last saw him7/17/2020.[name] He has a history of CAD status post LAD and diagonal branch stenting back in 2006, 2007 and 2008. He underwent cardiac catheterization by Dr. Nona Dell 06/09/09 revealing widely patent stents after a false positive Myoview. His other problems include chronic branch block, treated hypertension and hyperlipidemia. He denies chest pain or shortness of breath. 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 Since I saw him a year ago he remains completely stable. He has had an issue with his gallbladder and bile leak and has had multiple procedures for this. In addition, his abdominal ultrasound performed 08/28/2017 shows an increase in his abdominal dimensions from 3.5 to 4.5 cm.   Since I saw him a year ago he has developed increasing effort angina.  His blood pressures been difficult to control as well.  His Doppler studies of his kidney arteries performed 06/30/2019 revealed progression of his right renal artery stenosis.   Current Meds  Medication Sig  . allopurinol (ZYLOPRIM) 300 MG tablet TAKE 1 TABLET BY MOUTH ONCE DAILY  . amLODipine (NORVASC) 10 MG tablet Take 10 mg by mouth daily.  [name] aspirin EC 81 MG tablet Take 81 mg by mouth daily.  . brimonidine (ALPHAGAN) 0.15 % ophthalmic solution Place 1 drop into both eyes 2 (two) times daily.  . Cholecalciferol (VITAMIN D3) 5000 UNITS TABS Take 5,000 Units by mouth daily.  . clopidogrel (PLAVIX)  75 MG tablet TAKE 1 TABLET BY MOUTH ONCE DAILY FOR HEART STENTS (Patient taking differently: TAKE 1 TABLET (75mg ) BY MOUTH ONCE DAILY FOR HEART STENTS)  . dorzolamide (TRUSOPT) 2 % ophthalmic solution Place 1 drop into the right eye 3 (three) times daily.  . fenofibrate micronized (LOFIBRA) 134 MG capsule TAKE 1 CAPSULE BY MOUTH ONCE DAILY  . ferrous sulfate 325 (65 FE) MG tablet Take 325 mg by mouth daily with breakfast.  . furosemide (LASIX) 40 MG tablet Take 40-80 mg by mouth daily. 80mg  in the morning and 40mg  in the evening  . hydrALAZINE (APRESOLINE) 50 MG tablet Take 1 tablet (50 mg total) by mouth 3 (three) times daily.  [name] ketorolac (ACULAR) 0.5 % ophthalmic solution Uses after injections in eye  . nepafenac (ILEVRO) 0.3 % ophthalmic suspension Place 1 drop into the right eye daily.  . pravastatin (PRAVACHOL) 40 MG tablet TAKE 1 TABLET BY MOUTH AT BEDTIME FOR CHOLESTEROL  . triamcinolone cream (KENALOG) 0.1 % APPLY TO SCALP TWICE DAILY AS NEEDED  . trimethoprim-polymyxin b (POLYTRIM) ophthalmic solution INSTILL 1 DROP INTO RIGHT EYE 4 TIMES DAILY FOR 2 DAYS AFTER EACH MONTHLY EYE INJECTION     Allergies  Allergen Reactions  . Toprol Xl [Metoprolol Tartrate] Other (See Comments)    Bradycardia with beta blockers  . Lipitor [Atorvastatin]     Myalgias   . Coumadin [Warfarin Sodium] Rash  . Tape Rash    Clear tape  . Warfarin Rash    Social History   Socioeconomic History  . Marital status: Married  Spouse name: Not on file  . Number of children: Not on file  . Years of education: Not on file  . Highest education level: Not on file  Occupational History  . Not on file  Tobacco Use  . Smoking status: Never Smoker  . Smokeless tobacco: Never Used  Substance and Sexual Activity  . Alcohol use: No  . Drug use: No  . Sexual activity: Not on file  Other Topics Concern  . Not on file  Social History Narrative  . Not on file   Social Determinants of Health   Financial  Resource Strain:   . Difficulty of Paying Living Expenses:   Food Insecurity:   . Worried About Charity fundraiser in the Last Year:   . Arboriculturist in the Last Year:   Transportation Needs:   . Film/video editor (Medical):   [name] Lack of Transportation (Non-Medical):   Physical Activity:   . Days of Exercise per Week:   . Minutes of Exercise per Session:   Stress:   . Feeling of Stress :   Social Connections:   . Frequency of Communication with Friends and Family:   . Frequency of Social Gatherings with Friends and Family:   . Attends Religious Services:   . Active Member of Clubs or Organizations:   . Attends Archivist Meetings:   [name] Marital Status:   Intimate Partner Violence:   . Fear of Current or Ex-Partner:   . Emotionally Abused:   [name] Physically Abused:   . Sexually Abused:      Review of Systems: General: negative for chills, fever, night sweats or weight changes.  Cardiovascular: negative for chest pain, dyspnea on exertion, edema, orthopnea, palpitations, paroxysmal nocturnal dyspnea or shortness of breath Dermatological: negative for rash Respiratory: negative for cough or wheezing Urologic: negative for hematuria Abdominal: negative for nausea, vomiting, diarrhea, bright red blood per rectum, melena, or hematemesis Neurologic: negative for visual changes, syncope, or dizziness All other systems reviewed and are otherwise negative except as noted above.    Blood pressure (!) 170/64, pulse (!) 58, height 6\' 2"  (1.88 m), weight 166 lb 3.2 oz (75.4 kg), SpO2 99 %.  General appearance: alert and no distress Neck: no adenopathy, no JVD, supple, symmetrical, trachea midline, thyroid not enlarged, symmetric, no tenderness/mass/nodules and Bilateral carotid bruits Lungs: clear to auscultation bilaterally Heart: 2/6 outflow tract murmur consistent with aortic stenosis and/or sclerosis Extremities: extremities normal, atraumatic, no cyanosis or edema  Pulses: 2+ and symmetric Skin: Skin color, texture, turgor normal. No rashes or lesions Neurologic: Alert and oriented X 3, normal strength and tone. Normal symmetric reflexes. Normal coordination and gait  EKG sinus bradycardia 58 with right bundle branch block.  I personally reviewed this EKG.  ASSESSMENT AND PLAN:   ASCAD History of CAD status post LAD and diagonal branch stenting back in 2006, 2007 and 2008.  His last catheterization performed Dr. [name] 06/09/2009 revealed widely patent stents after a false positive Myoview.  He has had increasing exertional chest pain over the last several months.  I am going to get a Lexiscan Myoview stress test to further evaluate  Carotid artery disease ([place]) Bilateral carotid bruits with carotid Dopplers performed 09/04/2018 that showed minimal carotid artery disease.  No reason to repeat.  Right bundle branch block Chronic  Hyperlipidemia History of hyperlipidemia on statin therapy and fenofibrate with lipid profile performed 01/09/2019 revealing a total cholesterol of 108, LDL 73 and HDL 29.  Uncontrolled hypertension History of uncontrolled hypertension with blood pressure measured today at 170/64.  He was seeing our pharmacist for medicine titration.  He is on amlodipine, and hydralazine 3 times daily.  He is bradycardic and cannot be put on a beta-blocker and his chronic renal sufficiency and therefore cannot be put on ACE/ARB.  He does have renal artery stenosis which may be contributory.  Abdominal aortic aneurysm ([place]) History history of abdominal aortic aneurysm last checked 08/30/2018 at which time measured 4.5 x 4 cm.  We will recheck an abdominal ultrasound.  Renal artery stenosis ([facility]) History of bilateral renal artery stenosis with renal Dopplers performed 06/30/2019 revealing stable renal dimensions with a right renal aortic ratio of 7 and a left of 4 suggesting mild progression.  At this point, I am somewhat hesitant to recommend  angiography and intervention given his renal insufficiency.      [name] MD FACP,FACC,FAHA, [facility] 11/13/2019 8:28 AM

## 2019-11-13 NOTE — Assessment & Plan Note (Signed)
History of bilateral renal artery stenosis with renal Dopplers performed 06/30/2019 revealing stable renal dimensions with a right renal aortic ratio of 7 and a left of 4 suggesting mild progression.  At this point, I am somewhat hesitant to recommend angiography and intervention given his renal insufficiency.

## 2019-11-13 NOTE — Telephone Encounter (Signed)
Enrolled patient for a 14 day Zio monitor to be mailed to patients home.  

## 2019-11-13 NOTE — Patient Instructions (Addendum)
Medication Instructions:  Start Imdur 15 mg daily   *If you need a refill on your cardiac medications before your next appointment, please call your pharmacy*   Lab Work: BMET, LIPID, LIVER today  If you have labs (blood work) drawn today and your tests are completely normal, you will receive your results only by: [name] MyChart Message (if you have MyChart) OR . A paper copy in the mail If you have any lab test that is abnormal or we need to change your treatment, we will call you to review the results.   Testing/Procedures: Echocardiogram - Your physician has requested that you have an echocardiogram. Echocardiography is a painless test that uses sound waves to create images of your heart. It provides your doctor with information about the size and shape of your heart and how well your heart's chambers and valves are working. This procedure takes approximately one hour. There are no restrictions for this procedure. This will be performed at our [facility] location - [address], [address].  Your physician has requested that you have a lexiscan myoview. A cardiac stress test is a cardiological test that measures the heart's ability to respond to external stress in a controlled clinical environment. The stress response is induced by intravenous pharmacological stimulation.   Your physician has requested that you have a carotid duplex. This test is an ultrasound of the carotid arteries in your neck. It looks at blood flow through these arteries that supply the brain with blood. Allow one hour for this exam. There are no restrictions or special instructions.  Your physician has requested that you have an abdominal aorta duplex. During this test, an ultrasound is used to evaluate the aorta. Allow 30 minutes for this exam. Do not eat after midnight the day before and avoid carbonated beverages  Your physician has recommended that you wear a 14 DAY ZIO-PATCH monitor. The Zio patch cardiac monitor  continuously records heart rhythm data for up to 14 days, this is for patients being evaluated for multiple types heart rhythms. For the first 24 hours post application, please avoid getting the Zio monitor wet in the shower or by excessive sweating during exercise. After that, feel free to carry on with regular activities. Keep soaps and lotions away from the ZIO XT Patch.  This will be mailed to you, please expect 7-10 days to receive.         Follow-Up: At [facility], you and your health needs are our priority.  As part of our continuing mission to provide you with exceptional heart care, we have created designated Provider Care Teams.  These Care Teams include your primary Cardiologist (physician) and Advanced Practice Providers (APPs -  Physician Assistants and Nurse Practitioners) who all work together to provide you with the care you need, when you need it.  We recommend signing up for the patient portal called "MyChart".  Sign up information is provided on this After Visit Summary.  MyChart is used to connect with patients for Virtual Visits (Telemedicine).  Patients are able to view lab/test results, encounter notes, upcoming appointments, etc.  Non-urgent messages can be sent to your provider as well.   To learn more about what you can do with MyChart, go to [email or web address].    Your next appointment:   1 month(s)  The format for your next appointment:   In Person  Provider:   Quay Burow, MD

## 2019-11-13 NOTE — Assessment & Plan Note (Signed)
History history of abdominal aortic aneurysm last checked 08/30/2018 at which time measured 4.5 x 4 cm.  We will recheck an abdominal ultrasound.

## 2019-11-13 NOTE — Assessment & Plan Note (Signed)
Bilateral carotid bruits with carotid Dopplers performed 09/04/2018 that showed minimal carotid artery disease.  No reason to repeat.

## 2019-11-16 ENCOUNTER — Telehealth: Payer: Self-pay | Admitting: Medical

## 2019-11-16 NOTE — Telephone Encounter (Signed)
Needs well visit, follow up

## 2019-11-17 ENCOUNTER — Telehealth: Payer: Self-pay | Admitting: Physician Assistant

## 2019-11-17 ENCOUNTER — Other Ambulatory Visit (INDEPENDENT_AMBULATORY_CARE_PROVIDER_SITE_OTHER): Payer: Medicare Other

## 2019-11-17 DIAGNOSIS — I10 Essential (primary) hypertension: Secondary | ICD-10-CM

## 2019-11-17 DIAGNOSIS — I714 Abdominal aortic aneurysm, without rupture, unspecified: Secondary | ICD-10-CM

## 2019-11-17 DIAGNOSIS — R001 Bradycardia, unspecified: Secondary | ICD-10-CM

## 2019-11-17 DIAGNOSIS — I25708 Atherosclerosis of coronary artery bypass graft(s), unspecified, with other forms of angina pectoris: Secondary | ICD-10-CM

## 2019-11-17 NOTE — Telephone Encounter (Signed)
Contact patient 11/17/19 to schedule follow up visit, no answer left message

## 2019-11-24 ENCOUNTER — Telehealth (HOSPITAL_COMMUNITY): Payer: Self-pay

## 2019-11-24 NOTE — Telephone Encounter (Signed)
Encounter complete. 

## 2019-11-25 DIAGNOSIS — L814 Other melanin hyperpigmentation: Secondary | ICD-10-CM | POA: Diagnosis not present

## 2019-11-25 DIAGNOSIS — L905 Scar conditions and fibrosis of skin: Secondary | ICD-10-CM | POA: Diagnosis not present

## 2019-11-25 DIAGNOSIS — D225 Melanocytic nevi of trunk: Secondary | ICD-10-CM | POA: Diagnosis not present

## 2019-11-25 DIAGNOSIS — L821 Other seborrheic keratosis: Secondary | ICD-10-CM | POA: Diagnosis not present

## 2019-11-26 ENCOUNTER — Other Ambulatory Visit: Payer: Self-pay

## 2019-11-26 ENCOUNTER — Ambulatory Visit (HOSPITAL_COMMUNITY)
Admission: RE | Admit: 2019-11-26 | Discharge: 2019-11-26 | Disposition: A | Discharge status: Home / Self Care | Payer: Medicare Other | Source: Ambulatory Visit | Attending: Cardiology | Admitting: Cardiology

## 2019-11-26 DIAGNOSIS — I25708 Atherosclerosis of coronary artery bypass graft(s), unspecified, with other forms of angina pectoris: Secondary | ICD-10-CM | POA: Diagnosis not present

## 2019-11-26 LAB — MYOCARDIAL PERFUSION IMAGING
LV dias vol: 177 mL (ref 62–150)
LV sys vol: 81 mL
Peak HR: 65 {beats}/min
Rest HR: 59 {beats}/min
SDS: 1
SRS: 3
SSS: 4
TID: 0.93

## 2019-11-26 MED ORDER — TECHNETIUM TC 99M TETROFOSMIN IV KIT
10.1000 | PACK | Freq: Once | INTRAVENOUS | Status: AC | PRN
  Administered 2019-11-26: 10.1 via INTRAVENOUS
  Filled 2019-11-26: qty 11

## 2019-11-26 MED ORDER — TECHNETIUM TC 99M TETROFOSMIN IV KIT
32.2000 | PACK | Freq: Once | INTRAVENOUS | Status: AC | PRN
  Administered 2019-11-26: 32.2 via INTRAVENOUS
  Filled 2019-11-26: qty 33

## 2019-11-26 MED ORDER — REGADENOSON 0.4 MG/5ML IV SOLN
0.4000 mg | Freq: Once | INTRAVENOUS | Status: AC
Start: 2019-11-26 — End: 2019-11-26
  Administered 2019-11-26: 0.4 mg via INTRAVENOUS

## 2019-12-09 ENCOUNTER — Telehealth: Payer: Self-pay

## 2019-12-09 ENCOUNTER — Ambulatory Visit (HOSPITAL_COMMUNITY): Payer: Medicare Other | Attending: Cardiovascular Disease

## 2019-12-09 ENCOUNTER — Other Ambulatory Visit: Payer: Self-pay

## 2019-12-09 DIAGNOSIS — I25708 Atherosclerosis of coronary artery bypass graft(s), unspecified, with other forms of angina pectoris: Secondary | ICD-10-CM | POA: Insufficient documentation

## 2019-12-09 DIAGNOSIS — I10 Essential (primary) hypertension: Secondary | ICD-10-CM | POA: Diagnosis not present

## 2019-12-09 NOTE — Telephone Encounter (Signed)
Called patient left message on personal voice mail Dr.Berry wants to see you in office to discuss echo results.Appointment scheduled 6/17 at 11:15 am.

## 2019-12-10 ENCOUNTER — Other Ambulatory Visit: Payer: Self-pay

## 2019-12-10 ENCOUNTER — Encounter (INDEPENDENT_AMBULATORY_CARE_PROVIDER_SITE_OTHER): Payer: Medicare Other | Admitting: Ophthalmology

## 2019-12-10 ENCOUNTER — Telehealth: Payer: Self-pay

## 2019-12-10 ENCOUNTER — Ambulatory Visit: Payer: Medicare Other | Admitting: Cardiovascular Disease

## 2019-12-10 ENCOUNTER — Encounter: Payer: Self-pay | Admitting: Cardiovascular Disease

## 2019-12-10 ENCOUNTER — Other Ambulatory Visit (HOSPITAL_COMMUNITY): Payer: Medicare Other

## 2019-12-10 VITALS — BP 132/64 | HR 75 | Ht 74.0 in | Wt 165.0 lb

## 2019-12-10 DIAGNOSIS — H35033 Hypertensive retinopathy, bilateral: Secondary | ICD-10-CM | POA: Diagnosis not present

## 2019-12-10 DIAGNOSIS — Z01812 Encounter for preprocedural laboratory examination: Secondary | ICD-10-CM | POA: Diagnosis not present

## 2019-12-10 DIAGNOSIS — I10 Essential (primary) hypertension: Secondary | ICD-10-CM | POA: Diagnosis not present

## 2019-12-10 DIAGNOSIS — N184 Chronic kidney disease, stage 4 (severe): Secondary | ICD-10-CM

## 2019-12-10 DIAGNOSIS — H348132 Central retinal vein occlusion, bilateral, stable: Secondary | ICD-10-CM

## 2019-12-10 DIAGNOSIS — I350 Nonrheumatic aortic (valve) stenosis: Secondary | ICD-10-CM

## 2019-12-10 DIAGNOSIS — H353132 Nonexudative age-related macular degeneration, bilateral, intermediate dry stage: Secondary | ICD-10-CM

## 2019-12-10 DIAGNOSIS — I25708 Atherosclerosis of coronary artery bypass graft(s), unspecified, with other forms of angina pectoris: Secondary | ICD-10-CM | POA: Diagnosis not present

## 2019-12-10 DIAGNOSIS — H43813 Vitreous degeneration, bilateral: Secondary | ICD-10-CM

## 2019-12-10 NOTE — H&P (View-Only) (Signed)
12/10/2019 De Hollingshead Bohlman   1941/03/14  [id number]  Primary Physician Tysinger, Camelia Eng, PA-C Primary Cardiologist: [name] MD [name], [place]  HPI:  Terry Garner is a 79 y.o.  moderately overweight married Caucasian male father of one child formally a patient of Dr. Carlean Jews. [facility]. I last saw 11/13/2019. He has a history of CAD status post LAD and diagonal branch stenting back in 2006, 2007 and 2008. He underwent cardiac catheterization by Dr. Nona Dell 06/09/09 revealing widely patent stents after a false positive Myoview. His other problems include chronic branch block, treated hypertension and hyperlipidemia. He denies chest pain or shortness of breath. 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 Since I saw him a year ago he remains completely stable. He has had an issue with his gallbladder and bile leak and has had multiple procedures for this. In addition, his abdominal ultrasound performed 08/28/2017 shows an increase in his abdominal dimensions from 3.5 to 4.5 cm.   He  has developed increasing effort angina.  His blood pressures been difficult to control as well.  His Doppler studies of his kidney arteries performed 06/30/2019 revealed progression of his right renal artery stenosis.  I performed Myoview stress test that was nonischemic on 11/26/2019 a 2D echocardiogram on 12/09/2019 that showed severe aortic stenosis with moderate AI and a preserved EF.  He also had a moderate pericardial effusion without tamponade physiology.   Current Meds  Medication Sig  . allopurinol (ZYLOPRIM) 300 MG tablet TAKE 1 TABLET BY MOUTH ONCE DAILY  . amLODipine (NORVASC) 10 MG tablet Take 10 mg by mouth daily.  [name] aspirin EC 81 MG tablet Take 81 mg by mouth  daily.  . brimonidine (ALPHAGAN) 0.15 % ophthalmic solution Place 1 drop into both eyes 2 (two) times daily.  . Cholecalciferol (VITAMIN D3) 5000 UNITS TABS Take 5,000 Units by mouth daily.  . clopidogrel (PLAVIX) 75 MG tablet TAKE 1 TABLET BY MOUTH ONCE DAILY FOR HEART STENTS (Patient taking differently: TAKE 1 TABLET (75mg ) BY MOUTH ONCE DAILY FOR HEART STENTS)  . dorzolamide (TRUSOPT) 2 % ophthalmic solution Place 1 drop into the right eye 3 (three) times daily.  . fenofibrate micronized (LOFIBRA) 134 MG capsule TAKE 1 CAPSULE BY MOUTH ONCE DAILY  . ferrous sulfate 325 (65 FE) MG tablet Take 325 mg by mouth daily with breakfast.  . furosemide (LASIX) 40 MG tablet Take 40-80 mg by mouth daily. 80mg  in the morning and 40mg  in the evening  . isosorbide mononitrate (IMDUR) 30 MG 24 hr tablet Take 0.5 tablets (15 mg total) by mouth daily.  [name] ketorolac (ACULAR) 0.5 % ophthalmic solution Uses after injections in eye  . nepafenac (ILEVRO) 0.3 % ophthalmic suspension Place 1 drop into the right eye daily.  . pravastatin (PRAVACHOL) 40 MG tablet TAKE 1 TABLET BY MOUTH AT BEDTIME FOR CHOLESTEROL  . triamcinolone cream (KENALOG) 0.1 % APPLY TO SCALP TWICE DAILY AS NEEDED  . trimethoprim-polymyxin b (POLYTRIM) ophthalmic solution INSTILL 1 DROP INTO RIGHT EYE 4 TIMES DAILY FOR 2 DAYS AFTER EACH MONTHLY EYE INJECTION     Allergies  Allergen Reactions  . Toprol Xl [Metoprolol Tartrate] Other (See Comments)    Bradycardia with beta blockers  . Lipitor [Atorvastatin]     Myalgias   .  Coumadin [Warfarin Sodium] Rash  . Tape Rash    Clear tape  . Warfarin Rash    Social History   Socioeconomic History  . Marital status: Married    Spouse name: Not on file  . Number of children: Not on file  . Years of education: Not on file  . Highest education level: Not on file  Occupational History  . Not on file  Tobacco Use  . Smoking status: Never Smoker  . Smokeless tobacco: Never Used  Vaping Use    . Vaping Use: Never used  Substance and Sexual Activity  . Alcohol use: No  . Drug use: No  . Sexual activity: Not on file  Other Topics Concern  . Not on file  Social History Narrative  . Not on file   Social Determinants of Health   Financial Resource Strain:   . Difficulty of Paying Living Expenses:   Food Insecurity:   . Worried About Charity fundraiser in the Last Year:   . Arboriculturist in the Last Year:   Transportation Needs:   . Film/video editor (Medical):   [name] Lack of Transportation (Non-Medical):   Physical Activity:   . Days of Exercise per Week:   . Minutes of Exercise per Session:   Stress:   . Feeling of Stress :   Social Connections:   . Frequency of Communication with Friends and Family:   . Frequency of Social Gatherings with Friends and Family:   . Attends Religious Services:   . Active Member of Clubs or Organizations:   . Attends Archivist Meetings:   [name] Marital Status:   Intimate Partner Violence:   . Fear of Current or Ex-Partner:   . Emotionally Abused:   [name] Physically Abused:   . Sexually Abused:      Review of Systems: General: negative for chills, fever, night sweats or weight changes.  Cardiovascular: negative for chest pain, dyspnea on exertion, edema, orthopnea, palpitations, paroxysmal nocturnal dyspnea or shortness of breath Dermatological: negative for rash Respiratory: negative for cough or wheezing Urologic: negative for hematuria Abdominal: negative for nausea, vomiting, diarrhea, bright red blood per rectum, melena, or hematemesis Neurologic: negative for visual changes, syncope, or dizziness All other systems reviewed and are otherwise negative except as noted above.    Blood pressure 132/64, pulse 75, height 6\' 2"  (1.88 m), weight 165 lb (74.8 kg), SpO2 99 %.  General appearance: alert and no distress Neck: no adenopathy, no JVD, supple, symmetrical, trachea midline, thyroid not enlarged, symmetric, no  tenderness/mass/nodules and Bilateral carotid bruits Lungs: clear to auscultation bilaterally Heart: 2/6 systolic ejection murmur at the base consistent with aortic stenosis. Extremities: extremities normal, atraumatic, no cyanosis or edema Pulses: 2+ and symmetric Skin: Skin color, texture, turgor normal. No rashes or lesions Neurologic: Alert and oriented X 3, normal strength and tone. Normal symmetric reflexes. Normal coordination and gait  EKG not performed today  ASSESSMENT AND PLAN:   ASCAD History of CAD status post remote LAD diagonal branch stenting by Dr. Rex Kras back in 2006 previous cath in 2007 and 2008 as well.  His most recent cath by Dr. [name] 06/09/2009 revealed widely patent stents after false positive Myoview.  He was complaining of effort angina and a recent Myoview stress test performed 11/26/2019 was nonischemic.  Severe aortic stenosis Mr. Makarewicz returns today for follow-up of his 2D echo performed 12/09/2019 and read by Dr. Audie Box.  This revealed normal LV systolic function  with a peak aortic gradient of 71 mmHg and a valve area 0.95 cm with moderate aortic insufficiency as well.  He also had a moderate pericardial effusion without tamponade physiology.  His complaint of effort angina.  He does have severe renal insufficiency with serum creatinine of 3.3 as recently as 11/13/2019.  He also has a 4.5 cm abdominal aortic aneurysm.  I doubt he is a candidate for AVR surgically but may be a TAVR candidate.  I will discuss with Dr. Burt Knack and if so will arrange for him to have right left heart cath next week.[name] MD FACP,FACC,FAHA, [facility] 12/10/2019 11:52 AM

## 2019-12-10 NOTE — Patient Instructions (Signed)
Medication Instructions:  Your Physician recommend you continue on your current medication as directed.    *If you need a refill on your cardiac medications before your next appointment, please call your pharmacy*   Lab Work: Your physician recommends that you return for lab work today ( BMP, CBC)  If you have labs (blood work) drawn today and your tests are completely normal, you will receive your results only by: [name] MyChart Message (if you have MyChart) OR . A paper copy in the mail If you have any lab test that is abnormal or we need to change your treatment, we will call you to review the results.   Testing/Procedures: Your physician has requested that you have a cardiac catheterization. Cardiac catheterization is used to diagnose and/or treat various heart conditions. Doctors may recommend this procedure for a number of different reasons. The most common reason is to evaluate chest pain. Chest pain can be a symptom of coronary artery disease (CAD), and cardiac catheterization can show whether plaque is narrowing or blocking your heart's arteries. This procedure is also used to evaluate the valves, as well as measure the blood flow and oxygen levels in different parts of your heart. For further information please visit [email or web address]. Please follow instruction sheet, as given. [facility]   Follow-Up: At [facility], you and your health needs are our priority.  As part of our continuing mission to provide you with exceptional heart care, we have created designated Provider Care Teams.  These Care Teams include your primary Cardiologist (physician) and Advanced Practice Providers (APPs -  Physician Assistants and Nurse Practitioners) who all work together to provide you with the care you need, when you need it.  We recommend signing up for the patient portal called "MyChart".  Sign up information is provided on this After Visit Summary.  MyChart is used to connect with  patients for Virtual Visits (Telemedicine).  Patients are able to view lab/test results, encounter notes, upcoming appointments, etc.  Non-urgent messages can be sent to your provider as well.   To learn more about what you can do with MyChart, go to [email or web address].    Your next appointment:   3 week(s)  The format for your next appointment:   In Person  Provider:   Quay Burow, MD     [address] [place] [address] Dept: [place]: [place]  12/10/2019  You are scheduled for a Cardiac Catheterization on Thursday, June 24 with Dr. Quay Burow.  1. Please arrive at the [facility] (Main Entrance A) at [facility]: [address] [place], [place] [address] at 8:30 AM (This time is two hours before your procedure to ensure your preparation). Free valet parking service is available.   Special note: Every effort is made to have your procedure done on time. Please understand that emergencies sometimes delay scheduled procedures.  2. Diet: Do not eat solid foods after midnight.  The patient may have clear liquids until 5am upon the day of the procedure.  3. Labs: You will need to have blood drawn on today ( CBC, BMP)  4. Medication instructions in preparation for your procedure:   Contrast Allergy: No  Hold Lasix 40 mg- 2 days prior to procedure  On the morning of your procedure, take your Plavix/Clopidogrel and any morning medicines NOT listed above.  You may use sips of water.  5. Plan  for one night stay--bring personal belongings. 6. Bring a current list of your medications and current insurance cards. 7. You MUST have a responsible person to drive you home. 8. Someone MUST be with you the first 24 hours after you arrive home or your discharge will be delayed. 9. Please wear clothes that are easy to get on and off and wear  slip-on shoes.  Thank you for allowing [place] to care for you!   -- [facility]

## 2019-12-10 NOTE — Assessment & Plan Note (Signed)
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 Rex Kras back in 2006 previous cath in 2007 and 2008 as well.  His most recent cath by Dr. [name] 06/09/2009 revealed widely patent stents after false positive Myoview.  He was complaining of effort angina and a recent Myoview stress test performed 11/26/2019 was nonischemic.

## 2019-12-10 NOTE — Assessment & Plan Note (Signed)
Terry Garner returns today for follow-up of his 2D echo performed 12/09/2019 and read by Dr. Audie Box.  This revealed normal LV systolic function with a peak aortic gradient of 71 mmHg and a valve area 0.95 cm with moderate aortic insufficiency as well.  He also had a moderate pericardial effusion without tamponade physiology.  His complaint of effort angina.  He does have severe renal insufficiency with serum creatinine of 3.3 as recently as 11/13/2019.  He also has a 4.5 cm abdominal aortic aneurysm.  I doubt he is a candidate for AVR surgically but may be a TAVR candidate.  I will discuss with Dr. Burt Knack and if so will arrange for him to have right left heart cath next week.[name]

## 2019-12-10 NOTE — Progress Notes (Signed)
**Note Terry-Identified via Obfuscation** 12/10/2019 Terry Garner   May 13, 1941  [id number]  Primary Physician Tysinger, Camelia Eng, PA-C Primary Cardiologist: [name] MD Terry Garner, [place]  HPI:  Terry Garner is a 79 y.o.  moderately overweight married Caucasian male father of one child formally a patient of Dr. Carlean Jews. Terry Garner. I last saw 11/13/2019. He has a history of CAD status post LAD and diagonal branch stenting back in 2006, 2007 and 2008. He underwent cardiac catheterization by Dr. Nona Dell 06/09/09 revealing widely patent stents after a false positive Myoview. His other problems include chronic branch block, treated hypertension and hyperlipidemia. He denies chest pain or shortness of breath. 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 Since I saw him a year ago he remains completely stable. He has had an issue with his gallbladder and bile leak and has had multiple procedures for this. In addition, his abdominal ultrasound performed 08/28/2017 shows an increase in his abdominal dimensions from 3.5 to 4.5 cm.   He  has developed increasing effort angina.  His blood pressures been difficult to control as well.  His Doppler studies of his kidney arteries performed 06/30/2019 revealed progression of his right renal artery stenosis.  I performed Myoview stress test that was nonischemic on 11/26/2019 a 2D echocardiogram on 12/09/2019 that showed severe aortic stenosis with moderate AI and a preserved EF.  He also had a moderate pericardial effusion without tamponade physiology.   Current Meds  Medication Sig  . allopurinol (ZYLOPRIM) 300 MG tablet TAKE 1 TABLET BY MOUTH ONCE DAILY  . amLODipine (NORVASC) 10 MG tablet Take 10 mg by mouth daily.  [name] aspirin EC 81 MG tablet Take 81 mg by mouth  daily.  . brimonidine (ALPHAGAN) 0.15 % ophthalmic solution Place 1 drop into both eyes 2 (two) times daily.  . Cholecalciferol (VITAMIN D3) 5000 UNITS TABS Take 5,000 Units by mouth daily.  . clopidogrel (PLAVIX) 75 MG tablet TAKE 1 TABLET BY MOUTH ONCE DAILY FOR HEART STENTS (Patient taking differently: TAKE 1 TABLET (75mg ) BY MOUTH ONCE DAILY FOR HEART STENTS)  . dorzolamide (TRUSOPT) 2 % ophthalmic solution Place 1 drop into the right eye 3 (three) times daily.  . fenofibrate micronized (LOFIBRA) 134 MG capsule TAKE 1 CAPSULE BY MOUTH ONCE DAILY  . ferrous sulfate 325 (65 FE) MG tablet Take 325 mg by mouth daily with breakfast.  . furosemide (LASIX) 40 MG tablet Take 40-80 mg by mouth daily. 80mg  in the morning and 40mg  in the evening  . isosorbide mononitrate (IMDUR) 30 MG 24 hr tablet Take 0.5 tablets (15 mg total) by mouth daily.  [name] ketorolac (ACULAR) 0.5 % ophthalmic solution Uses after injections in eye  . nepafenac (ILEVRO) 0.3 % ophthalmic suspension Place 1 drop into the right eye daily.  . pravastatin (PRAVACHOL) 40 MG tablet TAKE 1 TABLET BY MOUTH AT BEDTIME FOR CHOLESTEROL  . triamcinolone cream (KENALOG) 0.1 % APPLY TO SCALP TWICE DAILY AS NEEDED  . trimethoprim-polymyxin b (POLYTRIM) ophthalmic solution INSTILL 1 DROP INTO RIGHT EYE 4 TIMES DAILY FOR 2 DAYS AFTER EACH MONTHLY EYE INJECTION     Allergies  Allergen Reactions  . Toprol Xl [Metoprolol Tartrate] Other (See Comments)    Bradycardia with beta blockers  . Lipitor [Atorvastatin]     Myalgias   .  Coumadin [Warfarin Sodium] Rash  . Tape Rash    Clear tape  . Warfarin Rash    Social History   Socioeconomic History  . Marital status: Married    Spouse name: Not on file  . Number of children: Not on file  . Years of education: Not on file  . Highest education level: Not on file  Occupational History  . Not on file  Tobacco Use  . Smoking status: Never Smoker  . Smokeless tobacco: Never Used  Vaping Use    . Vaping Use: Never used  Substance and Sexual Activity  . Alcohol use: No  . Drug use: No  . Sexual activity: Not on file  Other Topics Concern  . Not on file  Social History Narrative  . Not on file   Social Determinants of Health   Financial Resource Strain:   . Difficulty of Paying Living Expenses:   Food Insecurity:   . Worried About Charity fundraiser in the Last Year:   . Arboriculturist in the Last Year:   Transportation Needs:   . Film/video editor (Medical):   [name] Lack of Transportation (Non-Medical):   Physical Activity:   . Days of Exercise per Week:   . Minutes of Exercise per Session:   Stress:   . Feeling of Stress :   Social Connections:   . Frequency of Communication with Friends and Family:   . Frequency of Social Gatherings with Friends and Family:   . Attends Religious Services:   . Active Member of Clubs or Organizations:   . Attends Archivist Meetings:   [name] Marital Status:   Intimate Partner Violence:   . Fear of Current or Ex-Partner:   . Emotionally Abused:   [name] Physically Abused:   . Sexually Abused:      Review of Systems: General: negative for chills, fever, night sweats or weight changes.  Cardiovascular: negative for chest pain, dyspnea on exertion, edema, orthopnea, palpitations, paroxysmal nocturnal dyspnea or shortness of breath Dermatological: negative for rash Respiratory: negative for cough or wheezing Urologic: negative for hematuria Abdominal: negative for nausea, vomiting, diarrhea, bright red blood per rectum, melena, or hematemesis Neurologic: negative for visual changes, syncope, or dizziness All other systems reviewed and are otherwise negative except as noted above.    Blood pressure 132/64, pulse 75, height 6\' 2"  (1.88 m), weight 165 lb (74.8 kg), SpO2 99 %.  General appearance: alert and no distress Neck: no adenopathy, no JVD, supple, symmetrical, trachea midline, thyroid not enlarged, symmetric, no  tenderness/mass/nodules and Bilateral carotid bruits Lungs: clear to auscultation bilaterally Heart: 2/6 systolic ejection murmur at the base consistent with aortic stenosis. Extremities: extremities normal, atraumatic, no cyanosis or edema Pulses: 2+ and symmetric Skin: Skin color, texture, turgor normal. No rashes or lesions Neurologic: Alert and oriented X 3, normal strength and tone. Normal symmetric reflexes. Normal coordination and gait  EKG not performed today  ASSESSMENT AND PLAN:   ASCAD History of CAD status post remote LAD diagonal branch stenting by Dr. Rex Kras back in 2006 previous cath in 2007 and 2008 as well.  His most recent cath by Dr. [name] 06/09/2009 revealed widely patent stents after false positive Myoview.  He was complaining of effort angina and a recent Myoview stress test performed 11/26/2019 was nonischemic.  Severe aortic stenosis Mr. Grzelak returns today for follow-up of his 2D echo performed 12/09/2019 and read by Dr. Audie Box.  This revealed normal LV systolic function  with a peak aortic gradient of 71 mmHg and a valve area 0.95 cm with moderate aortic insufficiency as well.  He also had a moderate pericardial effusion without tamponade physiology.  His complaint of effort angina.  He does have severe renal insufficiency with serum creatinine of 3.3 as recently as 11/13/2019.  He also has a 4.5 cm abdominal aortic aneurysm.  I doubt he is a candidate for AVR surgically but may be a TAVR candidate.  I will discuss with Dr. Burt Knack and if so will arrange for him to have right left heart cath next week.[name] MD FACP,FACC,FAHA, [facility] 12/10/2019 11:52 AM

## 2019-12-10 NOTE — Telephone Encounter (Signed)
Confirmed with the patient's wife he has no current kidney MD.  Informed her the patient will need clearance from Nephrology prior to CT scans.  She understands she will be called to arrange OV.   Records and referral sent to [facility].

## 2019-12-11 LAB — CBC
Hematocrit: 26.9 % — ABNORMAL LOW (ref 37.5–51.0)
Hemoglobin: 8.8 g/dL — ABNORMAL LOW (ref 13.0–17.7)
MCH: 30.8 pg (ref 26.6–33.0)
MCHC: 32.7 g/dL (ref 31.5–35.7)
MCV: 94 fL (ref 79–97)
Platelets: 159 10*3/uL (ref 150–450)
RBC: 2.86 x10E6/uL — ABNORMAL LOW (ref 4.14–5.80)
RDW: 14 % (ref 11.6–15.4)
WBC: 5.1 10*3/uL (ref 3.4–10.8)

## 2019-12-11 LAB — BASIC METABOLIC PANEL
BUN/Creatinine Ratio: 17 (ref 10–24)
BUN: 51 mg/dL — ABNORMAL HIGH (ref 8–27)
CO2: 22 mmol/L (ref 20–29)
Calcium: 8.8 mg/dL (ref 8.6–10.2)
Chloride: 105 mmol/L (ref 96–106)
Creatinine, Ser: 2.96 mg/dL — ABNORMAL HIGH (ref 0.76–1.27)
GFR calc Af Amer: 22 mL/min/{1.73_m2} — ABNORMAL LOW (ref >59)
GFR calc non Af Amer: 19 mL/min/{1.73_m2} — ABNORMAL LOW (ref >59)
Glucose: 184 mg/dL — ABNORMAL HIGH (ref 65–99)
Potassium: 3.7 mmol/L (ref 3.5–5.2)
Sodium: 143 mmol/L (ref 134–144)

## 2019-12-14 NOTE — Telephone Encounter (Signed)
Patient's wife is calling to follow up in regards to referral sent to [facility]. She states she would like to ensure that the patient still needs to see a kidney doctor prior to procedure. Please call.

## 2019-12-14 NOTE — Telephone Encounter (Signed)
I spoke with the pt's wife and made her aware that the evaluation by [facility] is not required prior to the 6/24 cardiac catheterization.  This referral will need to be completed prior to the pt proceeding with pre TAVR CT scans. I also scheduled the pt for TAVR consult with Dr Burt Knack on 12/24/2019.

## 2019-12-15 ENCOUNTER — Telehealth: Payer: Self-pay | Admitting: *Deleted

## 2019-12-15 NOTE — Telephone Encounter (Signed)
Pt contacted pre-catheterization scheduled at [facility] for: Thursday December 17, 2019 1:30 PM Verified arrival time and place: [place] [facility]) at: 8:30 AM pre-procedure hydration   No solid food after midnight prior to cath, clear liquids until 5 AM day of procedure.  Hold: Lasix-day before and day of procedure-GFR 19  Except hold medications AM meds can be  taken pre-cath with sips of water including: ASA 81 mg   Confirmed patient has responsible adult to drive home post procedure and observe 24 hours after arriving home: yes  You are allowed ONE visitor in the waiting room during your procedure. Both you and your visitor must wear masks.      COVID-19 Pre-Screening Questions:  . In the past 7 to 10 days have you had a new cough, shortness of breath, headache, congestion, fever (100 or greater) unexplained body aches, new sore throat, or sudden loss of taste or sense of smell? no . In the past 7 to 10 days have you been around anyone with known Covid 19? no    Reviewed procedure/mask/visitor, pre-procedure hydration, COVID-19 screening questions with patient's wife (DPR), Peggy.

## 2019-12-17 ENCOUNTER — Ambulatory Visit (HOSPITAL_COMMUNITY)
Admission: RE | Admit: 2019-12-17 | Discharge: 2019-12-18 | Disposition: A | Discharge status: Home / Self Care | Payer: Medicare Other | Attending: Cardiovascular Disease | Admitting: Cardiovascular Disease

## 2019-12-17 ENCOUNTER — Encounter (HOSPITAL_COMMUNITY)
Admission: RE | Disposition: A | Discharge status: Home / Self Care | Payer: Self-pay | Source: Home / Self Care | Attending: Cardiovascular Disease

## 2019-12-17 ENCOUNTER — Other Ambulatory Visit: Payer: Self-pay

## 2019-12-17 DIAGNOSIS — I4510 Unspecified right bundle-branch block: Secondary | ICD-10-CM | POA: Diagnosis present

## 2019-12-17 DIAGNOSIS — I350 Nonrheumatic aortic (valve) stenosis: Secondary | ICD-10-CM

## 2019-12-17 DIAGNOSIS — N184 Chronic kidney disease, stage 4 (severe): Secondary | ICD-10-CM | POA: Diagnosis not present

## 2019-12-17 DIAGNOSIS — E663 Overweight: Secondary | ICD-10-CM | POA: Insufficient documentation

## 2019-12-17 DIAGNOSIS — D649 Anemia, unspecified: Secondary | ICD-10-CM | POA: Insufficient documentation

## 2019-12-17 DIAGNOSIS — I779 Disorder of arteries and arterioles, unspecified: Secondary | ICD-10-CM | POA: Diagnosis present

## 2019-12-17 DIAGNOSIS — I352 Nonrheumatic aortic (valve) stenosis with insufficiency: Secondary | ICD-10-CM | POA: Diagnosis not present

## 2019-12-17 DIAGNOSIS — I6529 Occlusion and stenosis of unspecified carotid artery: Secondary | ICD-10-CM | POA: Insufficient documentation

## 2019-12-17 DIAGNOSIS — Z6821 Body mass index (BMI) 21.0-21.9, adult: Secondary | ICD-10-CM | POA: Insufficient documentation

## 2019-12-17 DIAGNOSIS — Z7902 Long term (current) use of antithrombotics/antiplatelets: Secondary | ICD-10-CM | POA: Diagnosis not present

## 2019-12-17 DIAGNOSIS — Z79899 Other long term (current) drug therapy: Secondary | ICD-10-CM | POA: Diagnosis not present

## 2019-12-17 DIAGNOSIS — Z955 Presence of coronary angioplasty implant and graft: Secondary | ICD-10-CM | POA: Diagnosis not present

## 2019-12-17 DIAGNOSIS — I714 Abdominal aortic aneurysm, without rupture, unspecified: Secondary | ICD-10-CM | POA: Diagnosis present

## 2019-12-17 DIAGNOSIS — Z7982 Long term (current) use of aspirin: Secondary | ICD-10-CM | POA: Insufficient documentation

## 2019-12-17 DIAGNOSIS — I1310 Hypertensive heart and chronic kidney disease without heart failure, with stage 1 through stage 4 chronic kidney disease, or unspecified chronic kidney disease: Secondary | ICD-10-CM | POA: Diagnosis not present

## 2019-12-17 DIAGNOSIS — R001 Bradycardia, unspecified: Secondary | ICD-10-CM | POA: Diagnosis not present

## 2019-12-17 DIAGNOSIS — E785 Hyperlipidemia, unspecified: Secondary | ICD-10-CM | POA: Diagnosis not present

## 2019-12-17 DIAGNOSIS — Z888 Allergy status to other drugs, medicaments and biological substances status: Secondary | ICD-10-CM | POA: Insufficient documentation

## 2019-12-17 DIAGNOSIS — I701 Atherosclerosis of renal artery: Secondary | ICD-10-CM | POA: Diagnosis not present

## 2019-12-17 DIAGNOSIS — I25810 Atherosclerosis of coronary artery bypass graft(s) without angina pectoris: Secondary | ICD-10-CM | POA: Diagnosis present

## 2019-12-17 DIAGNOSIS — I10 Essential (primary) hypertension: Secondary | ICD-10-CM | POA: Diagnosis present

## 2019-12-17 DIAGNOSIS — I25118 Atherosclerotic heart disease of native coronary artery with other forms of angina pectoris: Secondary | ICD-10-CM | POA: Diagnosis not present

## 2019-12-17 DIAGNOSIS — I471 Supraventricular tachycardia: Secondary | ICD-10-CM | POA: Diagnosis not present

## 2019-12-17 HISTORY — PX: RIGHT/LEFT HEART CATH AND CORONARY ANGIOGRAPHY: CATH118266

## 2019-12-17 HISTORY — PX: ABDOMINAL AORTOGRAM: CATH118222

## 2019-12-17 LAB — POCT I-STAT 7, (LYTES, BLD GAS, ICA,H+H)
Acid-base deficit: 1 mmol/L (ref 0.0–2.0)
Bicarbonate: 22.8 mmol/L (ref 20.0–28.0)
Calcium, Ion: 1.22 mmol/L (ref 1.15–1.40)
HCT: 23 % — ABNORMAL LOW (ref 39.0–52.0)
Hemoglobin: 7.8 g/dL — ABNORMAL LOW (ref 13.0–17.0)
O2 Saturation: 100 %
Potassium: 3.6 mmol/L (ref 3.5–5.1)
Sodium: 146 mmol/L — ABNORMAL HIGH (ref 135–145)
TCO2: 24 mmol/L (ref 22–32)
pCO2 arterial: 34 mmHg (ref 32.0–48.0)
pH, Arterial: 7.435 (ref 7.350–7.450)
pO2, Arterial: 162 mmHg — ABNORMAL HIGH (ref 83.0–108.0)

## 2019-12-17 LAB — POCT I-STAT EG7
Acid-base deficit: 2 mmol/L (ref 0.0–2.0)
Acid-base deficit: 2 mmol/L (ref 0.0–2.0)
Bicarbonate: 22.7 mmol/L (ref 20.0–28.0)
Bicarbonate: 22.8 mmol/L (ref 20.0–28.0)
Calcium, Ion: 1.24 mmol/L (ref 1.15–1.40)
Calcium, Ion: 1.25 mmol/L (ref 1.15–1.40)
HCT: 23 % — ABNORMAL LOW (ref 39.0–52.0)
HCT: 23 % — ABNORMAL LOW (ref 39.0–52.0)
Hemoglobin: 7.8 g/dL — ABNORMAL LOW (ref 13.0–17.0)
Hemoglobin: 7.8 g/dL — ABNORMAL LOW (ref 13.0–17.0)
O2 Saturation: 81 %
O2 Saturation: 81 %
Potassium: 3.7 mmol/L (ref 3.5–5.1)
Potassium: 3.7 mmol/L (ref 3.5–5.1)
Sodium: 146 mmol/L — ABNORMAL HIGH (ref 135–145)
Sodium: 146 mmol/L — ABNORMAL HIGH (ref 135–145)
TCO2: 24 mmol/L (ref 22–32)
TCO2: 24 mmol/L (ref 22–32)
pCO2, Ven: 36.9 mmHg — ABNORMAL LOW (ref 44.0–60.0)
pCO2, Ven: 37.5 mmHg — ABNORMAL LOW (ref 44.0–60.0)
pH, Ven: 7.393 (ref 7.250–7.430)
pH, Ven: 7.397 (ref 7.250–7.430)
pO2, Ven: 45 mmHg (ref 32.0–45.0)
pO2, Ven: 45 mmHg (ref 32.0–45.0)

## 2019-12-17 SURGERY — RIGHT/LEFT HEART CATH AND CORONARY ANGIOGRAPHY
Anesthesia: LOCAL

## 2019-12-17 MED ORDER — SODIUM CHLORIDE 0.9 % WEIGHT BASED INFUSION: 1.0000 mL/kg/h | INTRAVENOUS | Status: DC

## 2019-12-17 MED ORDER — ASPIRIN 81 MG PO CHEW: 81.0000 mg | CHEWABLE_TABLET | ORAL | Status: DC

## 2019-12-17 MED ORDER — FUROSEMIDE 40 MG PO TABS
40.0000 mg | ORAL_TABLET | Freq: Every day | ORAL | Status: DC
  Administered 2019-12-17 – 2019-12-18 (×2): 40 mg via ORAL
  Filled 2019-12-17 (×2): qty 1

## 2019-12-17 MED ORDER — LABETALOL HCL 5 MG/ML IV SOLN
10.0000 mg | INTRAVENOUS | Status: AC | PRN
  Filled 2019-12-17: qty 4

## 2019-12-17 MED ORDER — ASPIRIN 81 MG PO CHEW: 81.0000 mg | CHEWABLE_TABLET | Freq: Every day | ORAL | Status: DC

## 2019-12-17 MED ORDER — CLOPIDOGREL BISULFATE 75 MG PO TABS
75.0000 mg | ORAL_TABLET | Freq: Every day | ORAL | Status: DC
  Administered 2019-12-18: 75 mg via ORAL
  Filled 2019-12-17: qty 1

## 2019-12-17 MED ORDER — HEPARIN (PORCINE) IN NACL 1000-0.9 UT/500ML-% IV SOLN
INTRAVENOUS | Status: DC | PRN
  Administered 2019-12-17: 500 mL

## 2019-12-17 MED ORDER — SODIUM CHLORIDE 0.9 % IV SOLN: INTRAVENOUS | Status: AC

## 2019-12-17 MED ORDER — ONDANSETRON HCL 4 MG/2ML IJ SOLN: 4.0000 mg | Freq: Four times a day (QID) | INTRAMUSCULAR | Status: DC | PRN

## 2019-12-17 MED ORDER — IOHEXOL 350 MG/ML SOLN
INTRAVENOUS | Status: DC | PRN
  Administered 2019-12-17: 65 mL

## 2019-12-17 MED ORDER — VERAPAMIL HCL 2.5 MG/ML IV SOLN
INTRAVENOUS | Status: AC
  Filled 2019-12-17: qty 2

## 2019-12-17 MED ORDER — LIDOCAINE HCL (PF) 1 % IJ SOLN
INTRAMUSCULAR | Status: AC
  Filled 2019-12-17: qty 30

## 2019-12-17 MED ORDER — HYDRALAZINE HCL 20 MG/ML IJ SOLN
INTRAMUSCULAR | Status: AC
  Filled 2019-12-17: qty 1

## 2019-12-17 MED ORDER — NITROGLYCERIN IN D5W 200-5 MCG/ML-% IV SOLN
5.0000 ug/min | INTRAVENOUS | Status: DC
  Administered 2019-12-17: 5 ug/min via INTRAVENOUS

## 2019-12-17 MED ORDER — PRAVASTATIN SODIUM 40 MG PO TABS
40.0000 mg | ORAL_TABLET | Freq: Every day | ORAL | Status: DC
  Filled 2019-12-17: qty 1

## 2019-12-17 MED ORDER — HEPARIN (PORCINE) IN NACL 1000-0.9 UT/500ML-% IV SOLN
INTRAVENOUS | Status: AC
  Filled 2019-12-17: qty 1000

## 2019-12-17 MED ORDER — LIDOCAINE HCL (PF) 1 % IJ SOLN
INTRAMUSCULAR | Status: DC | PRN
  Administered 2019-12-17 (×2): 2 mL
  Administered 2019-12-17: 20 mL

## 2019-12-17 MED ORDER — ACETAMINOPHEN 325 MG PO TABS: 650.0000 mg | ORAL_TABLET | ORAL | Status: DC | PRN

## 2019-12-17 MED ORDER — ISOSORBIDE MONONITRATE ER 30 MG PO TB24
15.0000 mg | ORAL_TABLET | Freq: Every day | ORAL | Status: DC
  Administered 2019-12-18: 15 mg via ORAL
  Filled 2019-12-17: qty 1

## 2019-12-17 MED ORDER — AMLODIPINE BESYLATE 10 MG PO TABS
10.0000 mg | ORAL_TABLET | Freq: Every day | ORAL | Status: DC
  Administered 2019-12-18: 10 mg via ORAL
  Filled 2019-12-17: qty 1

## 2019-12-17 MED ORDER — NITROGLYCERIN IN D5W 200-5 MCG/ML-% IV SOLN
INTRAVENOUS | Status: AC
  Filled 2019-12-17: qty 250

## 2019-12-17 MED ORDER — SODIUM CHLORIDE 0.9% FLUSH: 3.0000 mL | Freq: Two times a day (BID) | INTRAVENOUS | Status: DC

## 2019-12-17 MED ORDER — SODIUM CHLORIDE 0.9 % WEIGHT BASED INFUSION
3.0000 mL/kg/h | INTRAVENOUS | Status: DC
  Administered 2019-12-17: 3 mL/kg/h via INTRAVENOUS

## 2019-12-17 MED ORDER — HYDRALAZINE HCL 20 MG/ML IJ SOLN
10.0000 mg | INTRAMUSCULAR | Status: AC | PRN
  Administered 2019-12-17 (×4): 10 mg via INTRAVENOUS
  Filled 2019-12-17 (×2): qty 1

## 2019-12-17 MED ORDER — NITROGLYCERIN 1 MG/10 ML FOR IR/CATH LAB
INTRA_ARTERIAL | Status: AC
  Filled 2019-12-17: qty 10

## 2019-12-17 MED ORDER — HYDRALAZINE HCL 20 MG/ML IJ SOLN
INTRAMUSCULAR | Status: DC | PRN
  Administered 2019-12-17 (×2): 10 mg via INTRAVENOUS

## 2019-12-17 MED ORDER — ASPIRIN EC 81 MG PO TBEC
81.0000 mg | DELAYED_RELEASE_TABLET | Freq: Every day | ORAL | Status: DC
  Administered 2019-12-18: 81 mg via ORAL
  Filled 2019-12-17: qty 1

## 2019-12-17 MED ORDER — MORPHINE SULFATE (PF) 2 MG/ML IV SOLN: 2.0000 mg | INTRAVENOUS | Status: DC | PRN

## 2019-12-17 MED ORDER — SODIUM CHLORIDE 0.9 % IV SOLN: 250.0000 mL | INTRAVENOUS | Status: DC | PRN

## 2019-12-17 MED ORDER — SODIUM CHLORIDE 0.9% FLUSH: 3.0000 mL | INTRAVENOUS | Status: DC | PRN

## 2019-12-17 SURGICAL SUPPLY — 14 items
CATH INFINITI 5FR MULTPACK ANG (CATHETERS) ×1 IMPLANT
CATH SWAN DBL LUMAN 5F 110 (CATHETERS) ×1 IMPLANT
GLIDESHEATH SLEND A-KIT 6F 22G (SHEATH) ×1 IMPLANT
GUIDEWIRE INQWIRE 1.5J.035X260 (WIRE) IMPLANT
INQWIRE 1.5J .035X260CM (WIRE) ×2
KIT HEART LEFT (KITS) ×2 IMPLANT
PACK CARDIAC CATHETERIZATION (CUSTOM PROCEDURE TRAY) ×2 IMPLANT
SHEATH GLIDE SLENDER 4/5FR (SHEATH) ×1 IMPLANT
SHEATH PINNACLE 5F 10CM (SHEATH) ×1 IMPLANT
SYR MEDRAD MARK 7 150ML (SYRINGE) ×2 IMPLANT
TRANSDUCER W/STOPCOCK (MISCELLANEOUS) ×2 IMPLANT
TUBING CIL FLEX 10 FLL-RA (TUBING) ×2 IMPLANT
WIRE EMERALD 3MM-J .035X150CM (WIRE) ×1 IMPLANT
WIRE EMERALD ST .035X150CM (WIRE) ×1 IMPLANT

## 2019-12-17 NOTE — Progress Notes (Signed)
Site area: Right groin a 5 french arterial sheath was removed  Site Prior to Removal:  Level 0  Pressure Applied For 20 MINUTES    Bedrest Beginning at 1640pm  Manual:   Yes.    Patient Status During Pull:  stable  Post Pull Groin Site:  Level 0  Post Pull Instructions Given:  Yes.    Post Pull Pulses Present:  Yes.    Dressing Applied:  Yes.    Comments:

## 2019-12-17 NOTE — Plan of Care (Signed)
  Problem: Education: Goal: Knowledge of General Education information will improve Description Including pain rating scale, medication(s)/side effects and non-pharmacologic comfort measures Outcome: Progressing   Problem: Health Behavior/Discharge Planning: Goal: Ability to manage health-related needs will improve Outcome: Progressing   

## 2019-12-17 NOTE — Progress Notes (Addendum)
Patient to room 4E06 from cath lab. Vital signs obtained. Right groin site firm and BP elevated notified PA. Ordered to hold pressure and mark groin. Ordered to give hydralazine to get BP down. CHG bath completed. On monitor CCMD notified.  Paulene Floor, RN

## 2019-12-17 NOTE — Interval H&P Note (Signed)
**Note Terry-Identified via Obfuscation** Cath Lab Visit (complete for each Cath Lab visit)  Clinical Evaluation Leading to the Procedure:   ACS: No.  Non-ACS:    Anginal Classification: CCS II  Anti-ischemic medical therapy: Maximal Therapy (2 or more classes of medications)  Non-Invasive Test Results: No non-invasive testing performed  Prior CABG: No previous CABG      History and Physical Interval Note:  12/17/2019 2:14 PM  Terry Garner  has presented today for surgery, with the diagnosis of severe aortic stenosis.  The various methods of treatment have been discussed with the patient and family. After consideration of risks, benefits and other options for treatment, the patient has consented to  Procedure(s): RIGHT/LEFT HEART CATH AND CORONARY ANGIOGRAPHY (N/A) as a surgical intervention.  The patient's history has been reviewed, patient examined, no change in status, stable for surgery.  I have reviewed the patient's chart and labs.  Questions were answered to the patient's satisfaction.     Quay Burow

## 2019-12-17 NOTE — Progress Notes (Signed)
TR BAND REMOVAL  LOCATION:    Right radial   DEFLATED PER PROTOCOL:    Yes.    TIME BAND OFF / DRESSING APPLIED:    1730pm Gauze and tegaderm.  SITE UPON ARRIVAL:    Level 0  SITE AFTER BAND REMOVAL:    Level 0  CIRCULATION SENSATION AND MOVEMENT:    Within Normal Limits   Yes.    COMMENTS:   Care instruction given to patient.

## 2019-12-17 NOTE — Progress Notes (Signed)
Called by RN regarding groin firmness.  He is not actively bleeding.  He is not having pain.  Requested that she hold pressure again and try to get the groin to soften up.  She says she will do so.  I also requested that she mark the area of firmness so that we can track it.  Systolic blood pressure is currently in the 160s, with a heart rate in the 50s.  Will liberalize the parameters on the hydralazine.  Goal SBP is less than 135.  Rosaria Ferries, PA-C 12/17/2019 6:14 PM

## 2019-12-18 ENCOUNTER — Encounter (HOSPITAL_COMMUNITY): Payer: Self-pay | Admitting: Cardiovascular Disease

## 2019-12-18 DIAGNOSIS — I6529 Occlusion and stenosis of unspecified carotid artery: Secondary | ICD-10-CM | POA: Diagnosis not present

## 2019-12-18 DIAGNOSIS — E785 Hyperlipidemia, unspecified: Secondary | ICD-10-CM | POA: Diagnosis not present

## 2019-12-18 DIAGNOSIS — I714 Abdominal aortic aneurysm, without rupture: Secondary | ICD-10-CM | POA: Diagnosis not present

## 2019-12-18 DIAGNOSIS — I25810 Atherosclerosis of coronary artery bypass graft(s) without angina pectoris: Secondary | ICD-10-CM

## 2019-12-18 DIAGNOSIS — D649 Anemia, unspecified: Secondary | ICD-10-CM

## 2019-12-18 DIAGNOSIS — Z6821 Body mass index (BMI) 21.0-21.9, adult: Secondary | ICD-10-CM | POA: Diagnosis not present

## 2019-12-18 DIAGNOSIS — I4510 Unspecified right bundle-branch block: Secondary | ICD-10-CM | POA: Diagnosis not present

## 2019-12-18 DIAGNOSIS — N184 Chronic kidney disease, stage 4 (severe): Secondary | ICD-10-CM

## 2019-12-18 DIAGNOSIS — I1310 Hypertensive heart and chronic kidney disease without heart failure, with stage 1 through stage 4 chronic kidney disease, or unspecified chronic kidney disease: Secondary | ICD-10-CM | POA: Diagnosis not present

## 2019-12-18 DIAGNOSIS — Z79899 Other long term (current) drug therapy: Secondary | ICD-10-CM | POA: Diagnosis not present

## 2019-12-18 DIAGNOSIS — Z955 Presence of coronary angioplasty implant and graft: Secondary | ICD-10-CM | POA: Diagnosis not present

## 2019-12-18 DIAGNOSIS — Z7902 Long term (current) use of antithrombotics/antiplatelets: Secondary | ICD-10-CM | POA: Diagnosis not present

## 2019-12-18 DIAGNOSIS — Z7982 Long term (current) use of aspirin: Secondary | ICD-10-CM | POA: Diagnosis not present

## 2019-12-18 DIAGNOSIS — I350 Nonrheumatic aortic (valve) stenosis: Secondary | ICD-10-CM | POA: Diagnosis not present

## 2019-12-18 DIAGNOSIS — I352 Nonrheumatic aortic (valve) stenosis with insufficiency: Secondary | ICD-10-CM | POA: Diagnosis not present

## 2019-12-18 DIAGNOSIS — I25118 Atherosclerotic heart disease of native coronary artery with other forms of angina pectoris: Secondary | ICD-10-CM | POA: Diagnosis not present

## 2019-12-18 DIAGNOSIS — Z888 Allergy status to other drugs, medicaments and biological substances status: Secondary | ICD-10-CM | POA: Diagnosis not present

## 2019-12-18 DIAGNOSIS — I701 Atherosclerosis of renal artery: Secondary | ICD-10-CM | POA: Diagnosis not present

## 2019-12-18 DIAGNOSIS — E663 Overweight: Secondary | ICD-10-CM | POA: Diagnosis not present

## 2019-12-18 LAB — CBC
HCT: 23.6 % — ABNORMAL LOW (ref 39.0–52.0)
HCT: 24.9 % — ABNORMAL LOW (ref 39.0–52.0)
Hemoglobin: 7.4 g/dL — ABNORMAL LOW (ref 13.0–17.0)
Hemoglobin: 7.8 g/dL — ABNORMAL LOW (ref 13.0–17.0)
MCH: 30.7 pg (ref 26.0–34.0)
MCH: 30.6 pg (ref 26.0–34.0)
MCHC: 31.4 g/dL (ref 30.0–36.0)
MCHC: 31.3 g/dL (ref 30.0–36.0)
MCV: 97.9 fL (ref 80.0–100.0)
MCV: 97.6 fL (ref 80.0–100.0)
Platelets: 136 10*3/uL — ABNORMAL LOW (ref 150–400)
Platelets: 137 10*3/uL — ABNORMAL LOW (ref 150–400)
RBC: 2.41 MIL/uL — ABNORMAL LOW (ref 4.22–5.81)
RBC: 2.55 MIL/uL — ABNORMAL LOW (ref 4.22–5.81)
RDW: 15.5 % (ref 11.5–15.5)
RDW: 15.5 % (ref 11.5–15.5)
WBC: 5 10*3/uL (ref 4.0–10.5)
WBC: 5.4 10*3/uL (ref 4.0–10.5)
nRBC: 0 % (ref 0.0–0.2)
nRBC: 0 % (ref 0.0–0.2)

## 2019-12-18 LAB — BASIC METABOLIC PANEL
Anion gap: 14 (ref 5–15)
BUN: 46 mg/dL — ABNORMAL HIGH (ref 8–23)
CO2: 19 mmol/L — ABNORMAL LOW (ref 22–32)
Calcium: 8.2 mg/dL — ABNORMAL LOW (ref 8.9–10.3)
Chloride: 110 mmol/L (ref 98–111)
Creatinine, Ser: 2.67 mg/dL — ABNORMAL HIGH (ref 0.61–1.24)
GFR calc Af Amer: 25 mL/min — ABNORMAL LOW (ref >60)
GFR calc non Af Amer: 21 mL/min — ABNORMAL LOW (ref >60)
Glucose, Bld: 68 mg/dL — ABNORMAL LOW (ref 70–99)
Potassium: 3.8 mmol/L (ref 3.5–5.1)
Sodium: 143 mmol/L (ref 135–145)

## 2019-12-18 MED ORDER — ISOSORBIDE MONONITRATE ER 30 MG PO TB24: 30.0000 mg | ORAL_TABLET | Freq: Every day | ORAL | Status: DC

## 2019-12-18 MED ORDER — HYDRALAZINE HCL 50 MG PO TABS: 50.0000 mg | ORAL_TABLET | Freq: Three times a day (TID) | ORAL | 3 refills | Status: DC

## 2019-12-18 MED ORDER — ISOSORBIDE MONONITRATE ER 30 MG PO TB24
15.0000 mg | ORAL_TABLET | Freq: Once | ORAL | Status: AC
  Administered 2019-12-18: 15 mg via ORAL
  Filled 2019-12-18: qty 1

## 2019-12-18 MED ORDER — AMLODIPINE BESYLATE 10 MG PO TABS: 10.0000 mg | ORAL_TABLET | Freq: Every day | ORAL | 3 refills | Status: AC

## 2019-12-18 MED FILL — Nitroglycerin IV Soln 100 MCG/ML in D5W: INTRA_ARTERIAL | Qty: 10 | Status: AC

## 2019-12-18 MED FILL — Verapamil HCl IV Soln 2.5 MG/ML: INTRAVENOUS | Qty: 2 | Status: AC

## 2019-12-18 NOTE — Progress Notes (Signed)
12/18/2019 3:38 PM Discharge AVS meds taken today and those due this evening reviewed.  Follow-up appointments and when to call md reviewed.  D/C IV and TELE.  Questions and concerns addressed.   D/C home per orders. [place]

## 2019-12-18 NOTE — Discharge Summary (Signed)
Quay Burow, M.D. [birth year] DATE OF PROCEDURE:  12/17/2019 DATE OF DISCHARGE: CARDIAC CATHETERIZATION History obtained from chart review.Terry Garner is a 79 y.o.  moderately overweight married Caucasian male father of one child formally a patient of Dr. Carlean Jews. [facility]. I last saw 11/13/2019. He has a history of CAD status post LAD and diagonal branch stenting back in 2006, 2007 and 2008. He underwent cardiac catheterization by Dr. Nona Dell 06/09/09 revealing widely patent stents after a false positive Myoview. His other problems include chronic branch block, treated hypertension and hyperlipidemia. He denies chest pain or shortness of breath. 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 Since I saw him a year ago he remains completely stable. He has had an issue with his gallbladder and bile leak and has had multiple procedures for this. In addition, his abdominal ultrasound performed 08/28/2017 shows an increase in his abdominal dimensions from 3.5 to 4.5 cm.   He has developed increasing effort angina. His blood pressures been difficult to control as well. His Doppler studies of his kidney arteries performed 06/30/2019 revealed progression of his right renal artery stenosis.  I performed Myoview stress test that was nonischemic on 11/26/2019 a 2D echocardiogram on 12/09/2019 that showed severe aortic stenosis with moderate AI and a preserved EF.  He also had a moderate pericardial effusion without  tamponade physiology.  He presents today for right left heart cath to define his anatomy and physiology. Distal abdominal aortography-distally abdominal aortogram revealed moderate infrarenal abdominal aortic aneurysm with light widely patent though tortuous iliac arteries.   Terry Garner LAD and diagonal branch stents are patent.  He does have at least 80% ostial stenosis of D2 which is a small to moderate size vessel although I do not think this is contributing to his symptoms.  The remainder of his coronary anatomy are free of significant disease.  It was difficult crossing his aortic valve with a right Judkins catheter and a straight wire although I finally was able to cross but surprisingly the peak to peak pullback gradient was only 14 mmHg.  I did give the patient hydralazine 10 mg IV for elevated systolic blood pressure.  Is a total of 65 cc of contrast with my max GFR total of being 57 cc.  I did perform abdominal aortography which did reveal a moderate size infrarenal abdominal uric aneurysm and widely patent iliac arteries.  Is a total of 65 cc of contrast, only slightly above his GFR max limit.  Given his severe elevated hypertension, age and aortic stenosis I am electing to keep him overnight for observation and hydration.  The sheath was removed and pressure held.  The patient left lab in stable condition.  Plan will be for discharge home in the morning with close outpatient follow-up. Quay Burow. MD, [facility] 12/17/2019 3:16 PM   PERIPHERAL VASCULAR CATHETERIZATION  Result Date: 12/17/2019  Previously placed Prox LAD to Mid LAD stent (unknown type) is widely patent.  2nd Diag-1 lesion is 80% stenosed.  Previously placed 2nd Diag-2 stent (unknown type) is widely patent.  Hemodynamic findings consistent with aortic valve stenosis.  Ost LM lesion is 40% stenosed.  Dist LAD lesion is 60% stenosed.  Terry Garner is a 79 y.o. male  [id number] LOCATION:  FACILITY: [facility] PHYSICIAN: Quay Burow,  M.D. [birth year] DATE OF  Quay Burow, M.D. [birth year] DATE OF PROCEDURE:  12/17/2019 DATE OF DISCHARGE: CARDIAC CATHETERIZATION History obtained from chart review.Terry Garner is a 79 y.o.  moderately overweight married Caucasian male father of one child formally a patient of Dr. Carlean Jews. [facility]. I last saw 11/13/2019. He has a history of CAD status post LAD and diagonal branch stenting back in 2006, 2007 and 2008. He underwent cardiac catheterization by Dr. Nona Dell 06/09/09 revealing widely patent stents after a false positive Myoview. His other problems include chronic branch block, treated hypertension and hyperlipidemia. He denies chest pain or shortness of breath. 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 Since I saw him a year ago he remains completely stable. He has had an issue with his gallbladder and bile leak and has had multiple procedures for this. In addition, his abdominal ultrasound performed 08/28/2017 shows an increase in his abdominal dimensions from 3.5 to 4.5 cm.   He has developed increasing effort angina. His blood pressures been difficult to control as well. His Doppler studies of his kidney arteries performed 06/30/2019 revealed progression of his right renal artery stenosis.  I performed Myoview stress test that was nonischemic on 11/26/2019 a 2D echocardiogram on 12/09/2019 that showed severe aortic stenosis with moderate AI and a preserved EF.  He also had a moderate pericardial effusion without  tamponade physiology.  He presents today for right left heart cath to define his anatomy and physiology. Distal abdominal aortography-distally abdominal aortogram revealed moderate infrarenal abdominal aortic aneurysm with light widely patent though tortuous iliac arteries.   Terry Garner LAD and diagonal branch stents are patent.  He does have at least 80% ostial stenosis of D2 which is a small to moderate size vessel although I do not think this is contributing to his symptoms.  The remainder of his coronary anatomy are free of significant disease.  It was difficult crossing his aortic valve with a right Judkins catheter and a straight wire although I finally was able to cross but surprisingly the peak to peak pullback gradient was only 14 mmHg.  I did give the patient hydralazine 10 mg IV for elevated systolic blood pressure.  Is a total of 65 cc of contrast with my max GFR total of being 57 cc.  I did perform abdominal aortography which did reveal a moderate size infrarenal abdominal uric aneurysm and widely patent iliac arteries.  Is a total of 65 cc of contrast, only slightly above his GFR max limit.  Given his severe elevated hypertension, age and aortic stenosis I am electing to keep him overnight for observation and hydration.  The sheath was removed and pressure held.  The patient left lab in stable condition.  Plan will be for discharge home in the morning with close outpatient follow-up. Quay Burow. MD, [facility] 12/17/2019 3:16 PM   PERIPHERAL VASCULAR CATHETERIZATION  Result Date: 12/17/2019  Previously placed Prox LAD to Mid LAD stent (unknown type) is widely patent.  2nd Diag-1 lesion is 80% stenosed.  Previously placed 2nd Diag-2 stent (unknown type) is widely patent.  Hemodynamic findings consistent with aortic valve stenosis.  Ost LM lesion is 40% stenosed.  Dist LAD lesion is 60% stenosed.  Terry Garner is a 79 y.o. male  [id number] LOCATION:  FACILITY: [facility] PHYSICIAN: Quay Burow,  M.D. 05/[birth year] DATE OF  Quay Burow, M.D. [birth year] DATE OF PROCEDURE:  12/17/2019 DATE OF DISCHARGE: CARDIAC CATHETERIZATION History obtained from chart review.Terry Garner is a 79 y.o.  moderately overweight married Caucasian male father of one child formally a patient of Dr. Carlean Jews. [facility]. I last saw 11/13/2019. He has a history of CAD status post LAD and diagonal branch stenting back in 2006, 2007 and 2008. He underwent cardiac catheterization by Dr. Nona Dell 06/09/09 revealing widely patent stents after a false positive Myoview. His other problems include chronic branch block, treated hypertension and hyperlipidemia. He denies chest pain or shortness of breath. 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 Since I saw him a year ago he remains completely stable. He has had an issue with his gallbladder and bile leak and has had multiple procedures for this. In addition, his abdominal ultrasound performed 08/28/2017 shows an increase in his abdominal dimensions from 3.5 to 4.5 cm.   He has developed increasing effort angina. His blood pressures been difficult to control as well. His Doppler studies of his kidney arteries performed 06/30/2019 revealed progression of his right renal artery stenosis.  I performed Myoview stress test that was nonischemic on 11/26/2019 a 2D echocardiogram on 12/09/2019 that showed severe aortic stenosis with moderate AI and a preserved EF.  He also had a moderate pericardial effusion without  tamponade physiology.  He presents today for right left heart cath to define his anatomy and physiology. Distal abdominal aortography-distally abdominal aortogram revealed moderate infrarenal abdominal aortic aneurysm with light widely patent though tortuous iliac arteries.   Terry Garner LAD and diagonal branch stents are patent.  He does have at least 80% ostial stenosis of D2 which is a small to moderate size vessel although I do not think this is contributing to his symptoms.  The remainder of his coronary anatomy are free of significant disease.  It was difficult crossing his aortic valve with a right Judkins catheter and a straight wire although I finally was able to cross but surprisingly the peak to peak pullback gradient was only 14 mmHg.  I did give the patient hydralazine 10 mg IV for elevated systolic blood pressure.  Is a total of 65 cc of contrast with my max GFR total of being 57 cc.  I did perform abdominal aortography which did reveal a moderate size infrarenal abdominal uric aneurysm and widely patent iliac arteries.  Is a total of 65 cc of contrast, only slightly above his GFR max limit.  Given his severe elevated hypertension, age and aortic stenosis I am electing to keep him overnight for observation and hydration.  The sheath was removed and pressure held.  The patient left lab in stable condition.  Plan will be for discharge home in the morning with close outpatient follow-up. Quay Burow. MD, [facility] 12/17/2019 3:16 PM   PERIPHERAL VASCULAR CATHETERIZATION  Result Date: 12/17/2019  Previously placed Prox LAD to Mid LAD stent (unknown type) is widely patent.  2nd Diag-1 lesion is 80% stenosed.  Previously placed 2nd Diag-2 stent (unknown type) is widely patent.  Hemodynamic findings consistent with aortic valve stenosis.  Ost LM lesion is 40% stenosed.  Dist LAD lesion is 60% stenosed.  Terry Garner is a 79 y.o. male  [id number] LOCATION:  FACILITY: [facility] PHYSICIAN: Quay Burow,  M.D. [birth year] DATE OF  COMPLETE  Result Date: 12/09/2019    ECHOCARDIOGRAM REPORT   Patient Name:   Terry Garner Date of Exam: 12/09/2019 Medical Rec #:  [id number]       Height:       74.0 in Accession #:    [id number]      Weight:       166.0 lb Date of Birth:   03-Jul-1940        BSA:          2.008 m Patient Age:    74 years        BP:           140/88 mmHg Patient Gender: M               HR:           60 bpm. Exam Location:  [facility] Procedure: 2D Echo, 3D Echo, Cardiac Doppler, Color Doppler and Strain Analysis Indications:    R07.9 Chest pain  History:        Patient has prior history of Echocardiogram examinations, most                 recent 09/24/2017. Risk Factors:Hypertension and Dyslipidemia.                 Murmur. Coronary artery disease. Chronic kidney disease. AAA.  Sonographer:    Wilford Sports Rodgers-Jones RDCS Referring Phys: [place]  1. There is severe aortic stenosis, and at least moderate aortic regurgitation. Gradients across AoV partially related to regurgitation (LVOT VTI 27 cm). V max 4.2 m/s, MG 41 mmHG, AVA 0.95 cm2, DI 0.25. The aortic valve is tricuspid. Aortic valve regurgitation is moderate. Severe aortic valve stenosis. Aortic valve area, by VTI measures 0.95 cm. Aortic valve mean gradient measures 41.0 mmHg. Aortic valve Vmax measures 4.23 m/s.  2. Left ventricular ejection fraction, by estimation, is 60 to 65%. The left ventricle has normal function. The left ventricle has no regional wall motion abnormalities. There is mild concentric left ventricular hypertrophy. Left ventricular diastolic parameters are consistent with Grade I diastolic dysfunction (impaired relaxation).  3. Right ventricular systolic function is normal. The right ventricular size is normal. There is mildly elevated pulmonary artery systolic pressure. The estimated right ventricular systolic pressure is 98.3 mmHg.  4. Left atrial size was severely dilated.  5. Right atrial size was mildly dilated.  6. Moderate pericardial effusion. The pericardial effusion is circumferential. There is no evidence of cardiac tamponade.  7. The mitral valve is degenerative. Mild mitral valve regurgitation. No evidence of mitral stenosis.  8. The inferior vena cava  is normal in size with <50% respiratory variability, suggesting right atrial pressure of 8 mmHg. Comparison(s): Changes from prior study are noted. There is now a moderate pericardial effusion without signs of tamponade. Aortic stenosis is now severe with moderate regurgitation. EF remains normal. FINDINGS  Left Ventricle: Left ventricular ejection fraction, by estimation, is 60 to 65%. The left ventricle has normal function. The left ventricle has no regional wall motion abnormalities. The left ventricular internal cavity size was normal in size. There is  mild concentric left ventricular hypertrophy. Left ventricular diastolic parameters are consistent with Grade I diastolic dysfunction (impaired relaxation). Normal left ventricular filling pressure. Right Ventricle: The right ventricular size is normal. No increase in right ventricular wall thickness. Right ventricular systolic function is normal. There is mildly elevated pulmonary artery systolic pressure. The tricuspid regurgitant velocity is 2.66  m/s, and with  COMPLETE  Result Date: 12/09/2019    ECHOCARDIOGRAM REPORT   Patient Name:   Terry Garner Date of Exam: 12/09/2019 Medical Rec #:  [id number]       Height:       74.0 in Accession #:    [id number]      Weight:       166.0 lb Date of Birth:   03-Jul-1940        BSA:          2.008 m Patient Age:    74 years        BP:           140/88 mmHg Patient Gender: M               HR:           60 bpm. Exam Location:  [facility] Procedure: 2D Echo, 3D Echo, Cardiac Doppler, Color Doppler and Strain Analysis Indications:    R07.9 Chest pain  History:        Patient has prior history of Echocardiogram examinations, most                 recent 09/24/2017. Risk Factors:Hypertension and Dyslipidemia.                 Murmur. Coronary artery disease. Chronic kidney disease. AAA.  Sonographer:    Wilford Sports Rodgers-Jones RDCS Referring Phys: [place]  1. There is severe aortic stenosis, and at least moderate aortic regurgitation. Gradients across AoV partially related to regurgitation (LVOT VTI 27 cm). V max 4.2 m/s, MG 41 mmHG, AVA 0.95 cm2, DI 0.25. The aortic valve is tricuspid. Aortic valve regurgitation is moderate. Severe aortic valve stenosis. Aortic valve area, by VTI measures 0.95 cm. Aortic valve mean gradient measures 41.0 mmHg. Aortic valve Vmax measures 4.23 m/s.  2. Left ventricular ejection fraction, by estimation, is 60 to 65%. The left ventricle has normal function. The left ventricle has no regional wall motion abnormalities. There is mild concentric left ventricular hypertrophy. Left ventricular diastolic parameters are consistent with Grade I diastolic dysfunction (impaired relaxation).  3. Right ventricular systolic function is normal. The right ventricular size is normal. There is mildly elevated pulmonary artery systolic pressure. The estimated right ventricular systolic pressure is 98.3 mmHg.  4. Left atrial size was severely dilated.  5. Right atrial size was mildly dilated.  6. Moderate pericardial effusion. The pericardial effusion is circumferential. There is no evidence of cardiac tamponade.  7. The mitral valve is degenerative. Mild mitral valve regurgitation. No evidence of mitral stenosis.  8. The inferior vena cava  is normal in size with <50% respiratory variability, suggesting right atrial pressure of 8 mmHg. Comparison(s): Changes from prior study are noted. There is now a moderate pericardial effusion without signs of tamponade. Aortic stenosis is now severe with moderate regurgitation. EF remains normal. FINDINGS  Left Ventricle: Left ventricular ejection fraction, by estimation, is 60 to 65%. The left ventricle has normal function. The left ventricle has no regional wall motion abnormalities. The left ventricular internal cavity size was normal in size. There is  mild concentric left ventricular hypertrophy. Left ventricular diastolic parameters are consistent with Grade I diastolic dysfunction (impaired relaxation). Normal left ventricular filling pressure. Right Ventricle: The right ventricular size is normal. No increase in right ventricular wall thickness. Right ventricular systolic function is normal. There is mildly elevated pulmonary artery systolic pressure. The tricuspid regurgitant velocity is 2.66  m/s, and with  Quay Burow, M.D. [birth year] DATE OF PROCEDURE:  12/17/2019 DATE OF DISCHARGE: CARDIAC CATHETERIZATION History obtained from chart review.Terry Garner is a 79 y.o.  moderately overweight married Caucasian male father of one child formally a patient of Dr. Carlean Jews. [facility]. I last saw 11/13/2019. He has a history of CAD status post LAD and diagonal branch stenting back in 2006, 2007 and 2008. He underwent cardiac catheterization by Dr. Nona Dell 06/09/09 revealing widely patent stents after a false positive Myoview. His other problems include chronic branch block, treated hypertension and hyperlipidemia. He denies chest pain or shortness of breath. 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 Since I saw him a year ago he remains completely stable. He has had an issue with his gallbladder and bile leak and has had multiple procedures for this. In addition, his abdominal ultrasound performed 08/28/2017 shows an increase in his abdominal dimensions from 3.5 to 4.5 cm.   He has developed increasing effort angina. His blood pressures been difficult to control as well. His Doppler studies of his kidney arteries performed 06/30/2019 revealed progression of his right renal artery stenosis.  I performed Myoview stress test that was nonischemic on 11/26/2019 a 2D echocardiogram on 12/09/2019 that showed severe aortic stenosis with moderate AI and a preserved EF.  He also had a moderate pericardial effusion without  tamponade physiology.  He presents today for right left heart cath to define his anatomy and physiology. Distal abdominal aortography-distally abdominal aortogram revealed moderate infrarenal abdominal aortic aneurysm with light widely patent though tortuous iliac arteries.   Terry Garner LAD and diagonal branch stents are patent.  He does have at least 80% ostial stenosis of D2 which is a small to moderate size vessel although I do not think this is contributing to his symptoms.  The remainder of his coronary anatomy are free of significant disease.  It was difficult crossing his aortic valve with a right Judkins catheter and a straight wire although I finally was able to cross but surprisingly the peak to peak pullback gradient was only 14 mmHg.  I did give the patient hydralazine 10 mg IV for elevated systolic blood pressure.  Is a total of 65 cc of contrast with my max GFR total of being 57 cc.  I did perform abdominal aortography which did reveal a moderate size infrarenal abdominal uric aneurysm and widely patent iliac arteries.  Is a total of 65 cc of contrast, only slightly above his GFR max limit.  Given his severe elevated hypertension, age and aortic stenosis I am electing to keep him overnight for observation and hydration.  The sheath was removed and pressure held.  The patient left lab in stable condition.  Plan will be for discharge home in the morning with close outpatient follow-up. Quay Burow. MD, [facility] 12/17/2019 3:16 PM   PERIPHERAL VASCULAR CATHETERIZATION  Result Date: 12/17/2019  Previously placed Prox LAD to Mid LAD stent (unknown type) is widely patent.  2nd Diag-1 lesion is 80% stenosed.  Previously placed 2nd Diag-2 stent (unknown type) is widely patent.  Hemodynamic findings consistent with aortic valve stenosis.  Ost LM lesion is 40% stenosed.  Dist LAD lesion is 60% stenosed.  Terry Garner is a 79 y.o. male  [id number] LOCATION:  FACILITY: [facility] PHYSICIAN: Quay Burow,  M.D. [birth year] DATE OF  COMPLETE  Result Date: 12/09/2019    ECHOCARDIOGRAM REPORT   Patient Name:   Terry Garner Date of Exam: 12/09/2019 Medical Rec #:  [id number]       Height:       74.0 in Accession #:    [id number]      Weight:       166.0 lb Date of Birth:   03-Jul-1940        BSA:          2.008 m Patient Age:    74 years        BP:           140/88 mmHg Patient Gender: M               HR:           60 bpm. Exam Location:  [facility] Procedure: 2D Echo, 3D Echo, Cardiac Doppler, Color Doppler and Strain Analysis Indications:    R07.9 Chest pain  History:        Patient has prior history of Echocardiogram examinations, most                 recent 09/24/2017. Risk Factors:Hypertension and Dyslipidemia.                 Murmur. Coronary artery disease. Chronic kidney disease. AAA.  Sonographer:    Wilford Sports Rodgers-Jones RDCS Referring Phys: [place]  1. There is severe aortic stenosis, and at least moderate aortic regurgitation. Gradients across AoV partially related to regurgitation (LVOT VTI 27 cm). V max 4.2 m/s, MG 41 mmHG, AVA 0.95 cm2, DI 0.25. The aortic valve is tricuspid. Aortic valve regurgitation is moderate. Severe aortic valve stenosis. Aortic valve area, by VTI measures 0.95 cm. Aortic valve mean gradient measures 41.0 mmHg. Aortic valve Vmax measures 4.23 m/s.  2. Left ventricular ejection fraction, by estimation, is 60 to 65%. The left ventricle has normal function. The left ventricle has no regional wall motion abnormalities. There is mild concentric left ventricular hypertrophy. Left ventricular diastolic parameters are consistent with Grade I diastolic dysfunction (impaired relaxation).  3. Right ventricular systolic function is normal. The right ventricular size is normal. There is mildly elevated pulmonary artery systolic pressure. The estimated right ventricular systolic pressure is 98.3 mmHg.  4. Left atrial size was severely dilated.  5. Right atrial size was mildly dilated.  6. Moderate pericardial effusion. The pericardial effusion is circumferential. There is no evidence of cardiac tamponade.  7. The mitral valve is degenerative. Mild mitral valve regurgitation. No evidence of mitral stenosis.  8. The inferior vena cava  is normal in size with <50% respiratory variability, suggesting right atrial pressure of 8 mmHg. Comparison(s): Changes from prior study are noted. There is now a moderate pericardial effusion without signs of tamponade. Aortic stenosis is now severe with moderate regurgitation. EF remains normal. FINDINGS  Left Ventricle: Left ventricular ejection fraction, by estimation, is 60 to 65%. The left ventricle has normal function. The left ventricle has no regional wall motion abnormalities. The left ventricular internal cavity size was normal in size. There is  mild concentric left ventricular hypertrophy. Left ventricular diastolic parameters are consistent with Grade I diastolic dysfunction (impaired relaxation). Normal left ventricular filling pressure. Right Ventricle: The right ventricular size is normal. No increase in right ventricular wall thickness. Right ventricular systolic function is normal. There is mildly elevated pulmonary artery systolic pressure. The tricuspid regurgitant velocity is 2.66  m/s, and with  Quay Burow, M.D. [birth year] DATE OF PROCEDURE:  12/17/2019 DATE OF DISCHARGE: CARDIAC CATHETERIZATION History obtained from chart review.Terry Garner is a 79 y.o.  moderately overweight married Caucasian male father of one child formally a patient of Dr. Carlean Jews. [facility]. I last saw 11/13/2019. He has a history of CAD status post LAD and diagonal branch stenting back in 2006, 2007 and 2008. He underwent cardiac catheterization by Dr. Nona Dell 06/09/09 revealing widely patent stents after a false positive Myoview. His other problems include chronic branch block, treated hypertension and hyperlipidemia. He denies chest pain or shortness of breath. 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 Since I saw him a year ago he remains completely stable. He has had an issue with his gallbladder and bile leak and has had multiple procedures for this. In addition, his abdominal ultrasound performed 08/28/2017 shows an increase in his abdominal dimensions from 3.5 to 4.5 cm.   He has developed increasing effort angina. His blood pressures been difficult to control as well. His Doppler studies of his kidney arteries performed 06/30/2019 revealed progression of his right renal artery stenosis.  I performed Myoview stress test that was nonischemic on 11/26/2019 a 2D echocardiogram on 12/09/2019 that showed severe aortic stenosis with moderate AI and a preserved EF.  He also had a moderate pericardial effusion without  tamponade physiology.  He presents today for right left heart cath to define his anatomy and physiology. Distal abdominal aortography-distally abdominal aortogram revealed moderate infrarenal abdominal aortic aneurysm with light widely patent though tortuous iliac arteries.   Terry Garner LAD and diagonal branch stents are patent.  He does have at least 80% ostial stenosis of D2 which is a small to moderate size vessel although I do not think this is contributing to his symptoms.  The remainder of his coronary anatomy are free of significant disease.  It was difficult crossing his aortic valve with a right Judkins catheter and a straight wire although I finally was able to cross but surprisingly the peak to peak pullback gradient was only 14 mmHg.  I did give the patient hydralazine 10 mg IV for elevated systolic blood pressure.  Is a total of 65 cc of contrast with my max GFR total of being 57 cc.  I did perform abdominal aortography which did reveal a moderate size infrarenal abdominal uric aneurysm and widely patent iliac arteries.  Is a total of 65 cc of contrast, only slightly above his GFR max limit.  Given his severe elevated hypertension, age and aortic stenosis I am electing to keep him overnight for observation and hydration.  The sheath was removed and pressure held.  The patient left lab in stable condition.  Plan will be for discharge home in the morning with close outpatient follow-up. Quay Burow. MD, [facility] 12/17/2019 3:16 PM   PERIPHERAL VASCULAR CATHETERIZATION  Result Date: 12/17/2019  Previously placed Prox LAD to Mid LAD stent (unknown type) is widely patent.  2nd Diag-1 lesion is 80% stenosed.  Previously placed 2nd Diag-2 stent (unknown type) is widely patent.  Hemodynamic findings consistent with aortic valve stenosis.  Ost LM lesion is 40% stenosed.  Dist LAD lesion is 60% stenosed.  Terry Garner is a 79 y.o. male  [id number] LOCATION:  FACILITY: [facility] PHYSICIAN: Quay Burow,  M.D. [birth year] DATE OF  COMPLETE  Result Date: 12/09/2019    ECHOCARDIOGRAM REPORT   Patient Name:   Terry Garner Date of Exam: 12/09/2019 Medical Rec #:  [id number]       Height:       74.0 in Accession #:    [id number]      Weight:       166.0 lb Date of Birth:   03-Jul-1940        BSA:          2.008 m Patient Age:    74 years        BP:           140/88 mmHg Patient Gender: M               HR:           60 bpm. Exam Location:  [facility] Procedure: 2D Echo, 3D Echo, Cardiac Doppler, Color Doppler and Strain Analysis Indications:    R07.9 Chest pain  History:        Patient has prior history of Echocardiogram examinations, most                 recent 09/24/2017. Risk Factors:Hypertension and Dyslipidemia.                 Murmur. Coronary artery disease. Chronic kidney disease. AAA.  Sonographer:    Wilford Sports Rodgers-Jones RDCS Referring Phys: [place]  1. There is severe aortic stenosis, and at least moderate aortic regurgitation. Gradients across AoV partially related to regurgitation (LVOT VTI 27 cm). V max 4.2 m/s, MG 41 mmHG, AVA 0.95 cm2, DI 0.25. The aortic valve is tricuspid. Aortic valve regurgitation is moderate. Severe aortic valve stenosis. Aortic valve area, by VTI measures 0.95 cm. Aortic valve mean gradient measures 41.0 mmHg. Aortic valve Vmax measures 4.23 m/s.  2. Left ventricular ejection fraction, by estimation, is 60 to 65%. The left ventricle has normal function. The left ventricle has no regional wall motion abnormalities. There is mild concentric left ventricular hypertrophy. Left ventricular diastolic parameters are consistent with Grade I diastolic dysfunction (impaired relaxation).  3. Right ventricular systolic function is normal. The right ventricular size is normal. There is mildly elevated pulmonary artery systolic pressure. The estimated right ventricular systolic pressure is 98.3 mmHg.  4. Left atrial size was severely dilated.  5. Right atrial size was mildly dilated.  6. Moderate pericardial effusion. The pericardial effusion is circumferential. There is no evidence of cardiac tamponade.  7. The mitral valve is degenerative. Mild mitral valve regurgitation. No evidence of mitral stenosis.  8. The inferior vena cava  is normal in size with <50% respiratory variability, suggesting right atrial pressure of 8 mmHg. Comparison(s): Changes from prior study are noted. There is now a moderate pericardial effusion without signs of tamponade. Aortic stenosis is now severe with moderate regurgitation. EF remains normal. FINDINGS  Left Ventricle: Left ventricular ejection fraction, by estimation, is 60 to 65%. The left ventricle has normal function. The left ventricle has no regional wall motion abnormalities. The left ventricular internal cavity size was normal in size. There is  mild concentric left ventricular hypertrophy. Left ventricular diastolic parameters are consistent with Grade I diastolic dysfunction (impaired relaxation). Normal left ventricular filling pressure. Right Ventricle: The right ventricular size is normal. No increase in right ventricular wall thickness. Right ventricular systolic function is normal. There is mildly elevated pulmonary artery systolic pressure. The tricuspid regurgitant velocity is 2.66  m/s, and with

## 2019-12-18 NOTE — Progress Notes (Signed)
12/18/2019 2:14 PM Pt ambulated over 470 ft on RA without assistance.  RA sats were 98-100%.  BP after walking is 154/58.  Pt stated he felt just fine walking. [place]

## 2019-12-22 ENCOUNTER — Telehealth: Payer: Self-pay | Admitting: Cardiovascular Disease

## 2019-12-22 NOTE — Telephone Encounter (Signed)
New Message:   Wife called and said pt had a Cath last week. She wants to know  If he still still his AAA Duplex and Carotid test on 12-31-19?

## 2019-12-22 NOTE — Telephone Encounter (Signed)
Returned the call to the patient's wife per the dpr. She has been advised that the patient should keep his appointments for the AAA and Carotid as an annual follow up.

## 2019-12-24 ENCOUNTER — Encounter: Payer: Self-pay | Admitting: Cardiovascular Disease

## 2019-12-24 ENCOUNTER — Ambulatory Visit: Payer: Medicare Other | Admitting: Cardiovascular Disease

## 2019-12-24 ENCOUNTER — Other Ambulatory Visit: Payer: Self-pay

## 2019-12-24 VITALS — BP 148/64 | HR 77 | Ht 74.0 in | Wt 164.2 lb

## 2019-12-24 DIAGNOSIS — I313 Pericardial effusion (noninflammatory): Secondary | ICD-10-CM

## 2019-12-24 DIAGNOSIS — I3139 Other pericardial effusion (noninflammatory): Secondary | ICD-10-CM

## 2019-12-24 DIAGNOSIS — I350 Nonrheumatic aortic (valve) stenosis: Secondary | ICD-10-CM | POA: Diagnosis not present

## 2019-12-24 NOTE — Progress Notes (Signed)
Cardiology Office Note:    Date:  12/24/2019   ID:  Terry Garner, DOB Mar 08, 1941, MRN [id number]  PCP:  Carlena Hurl, PA-C  [facility] Cardiologist:  Quay Burow, MD  [facility] Electrophysiologist:  None   Referring MD: Carlena Hurl, PA-C   Chief Complaint  Patient presents with  . Shortness of Breath    History of Present Illness:    Terry Garner is a 79 y.o. male with a hx of coronary artery disease with remote PCI of the LAD and diagonal branches, hypertension, mixed hyperlipidemia, stage IV chronic kidney disease, moderate carotid atherosclerosis, and abdominal aortic aneurysm, referred by Dr. [name] for evaluation of severe aortic stenosis.  When the patient was most recently evaluated in May 2021, he was noted to have new onset exertional angina.  A Myoview stress test demonstrated an LVEF of 55% with a moderate fixed defect in the inferoapical location felt to be thinning versus a small prior infarct.  With no ischemia the study was determined to be low risk.  An echocardiogram was performed to evaluate an aortic valve murmur, and this demonstrated findings consistent with severe aortic stenosis and moderate aortic regurgitation.  Pertinent findings included a maximum transaortic velocity of 4.2 m/s, mean transvalvular gradient 41 mmHg, aortic valve area 0.95 cm, and dimensionless index 0.24.  The patient then underwent right and left heart catheterization demonstrating patent stents in the LAD and diagonal branches with 80% ostial stenosis of a small to moderate second diagonal branch.  There is no other significant coronary obstruction noted in ongoing medical therapy was felt to be appropriate.  The patient's transaortic valve gradient by catheterization was only 14 mmHg, but he was noted to be severely hypertensive during the procedure.  He is now referred to discuss further diagnostic and treatment options related to severe aortic stenosis.  The patient is  here with his wife today. He has recently been diagnosed with severe aortic stenosis and is referred for discussion of further diagnostic and treatment options. The patient and his wife live independently. He still enjoys doing light yard work and working in his garage. He admits to progressive fatigue and shortness of breath over the past 6-12 months.   He has a history of anemia and has required iron infusion. Looking over his labs, his HgB ranged 10-11 mg/dL from 2016-2019, and has ranged from 8.0-9.5 over the past 2 years. His most recent HgB is 7.8 mg/dL. He also has Stage 4 CKD, and was previously followed by Dr Mercy Moore, and then reports seeing Dr Marval Regal a few years ago but hasn't seen him recently. 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 The patient complains of unintentional weight loss and he used to weigh over 200 pounds about 5 years ago.  His weight is actually remained stable over the past 12 months at 164 pounds today.  Past Medical History:  Diagnosis Date  . AAA (abdominal aortic aneurysm) (HCC)    3.6 cm (01/09/12 ultrasound)  . Allergy   . Arthritis   . Bile duct leak 09/2017   post op  . Carotid artery disease ([place])   . Cataract   . Chronic kidney disease    CKD, right renal artery stenosis  . Coronary artery disease    mild left internal carotid artery stenosis  . Glaucoma   . Gout   . Heart murmur   . Hyperlipidemia   . Hypertension   . Tubular adenoma  of colon 2003   Dr. Watt Climes  . Vitamin D deficiency     Past Surgical History:  Procedure Laterality Date  . ABDOMINAL AORTOGRAM N/A 12/17/2019   Procedure: ABDOMINAL AORTOGRAM;  Surgeon: [name], MD;  Location: [place] [facility];  Service: Cardiovascular;  Laterality: N/A;  . BACK SURGERY    . CARDIAC CATHETERIZATION     stents  last -07  . CARDIAC STENTS    . CERVICAL DISC SURGERY    . CHOLECYSTECTOMY N/A 09/26/2017   Procedure: LAPAROSCOPIC CHOLECYSTECTOMY;  Surgeon:  Rolm Bookbinder, MD;  Location: [place];  Service: General;  Laterality: N/A;  . ERCP N/A 10/09/2017   Procedure: ENDOSCOPIC RETROGRADE CHOLANGIOPANCREATOGRAPHY (ERCP);  Surgeon: Milus Banister, MD;  [facility];  Service: Endoscopy;  Laterality: N/A;  . EYE SURGERY Left 06   retinal detachment  . HERNIA REPAIR Right 93  . IR RADIOLOGIST EVAL & MGMT  10/29/2017  . LUMBAR LAMINECTOMY  06/06/2012   Procedure: MICRODISCECTOMY LUMBAR LAMINECTOMY;  Surgeon: Marybelle Killings, MD;  Location: [place];  Service: Orthopedics;  Laterality: Left;  Left L4-5 Microdiscectomy  . RIGHT/LEFT HEART CATH AND CORONARY ANGIOGRAPHY N/A 12/17/2019   Procedure: RIGHT/LEFT HEART CATH AND CORONARY ANGIOGRAPHY;  Surgeon: [name], MD;  Location: [place] [facility];  Service: Cardiovascular;  Laterality: N/A;  . SHOULDER ARTHROSCOPY Right   . TOTAL HIP ARTHROPLASTY Left     Current Medications: Current Meds  Medication Sig  . allopurinol (ZYLOPRIM) 300 MG tablet TAKE 1 TABLET BY MOUTH ONCE DAILY  . amLODipine (NORVASC) 10 MG tablet Take 1 tablet (10 mg total) by mouth daily.  [name] aspirin EC 81 MG tablet Take 81 mg by mouth daily.  . brimonidine (ALPHAGAN) 0.15 % ophthalmic solution Place 1 drop into both eyes 2 (two) times daily.  . Cholecalciferol (VITAMIN D3) 5000 UNITS TABS Take 5,000 Units by mouth daily.  . clopidogrel (PLAVIX) 75 MG tablet TAKE 1 TABLET BY MOUTH ONCE DAILY FOR HEART STENTS  . dorzolamide (TRUSOPT) 2 % ophthalmic solution Place 1 drop into the right eye 2 (two) times daily.   . fenofibrate micronized (LOFIBRA) 134 MG capsule TAKE 1 CAPSULE BY MOUTH ONCE DAILY  . ferrous sulfate 325 (65 FE) MG tablet Take 325 mg by mouth daily with breakfast.  . furosemide (LASIX) 40 MG tablet Take 40 mg by mouth daily.   . hydrALAZINE (APRESOLINE) 50 MG tablet Take 1 tablet (50 mg total) by mouth 3 (three) times daily. Start 12/19/19.  [name] isosorbide mononitrate (IMDUR) 30 MG 24 hr tablet Take 0.5  tablets (15 mg total) by mouth daily.  [name] ketorolac (ACULAR) 0.5 % ophthalmic solution Place 1 drop into the right eye daily as needed (itching eye).   . nepafenac (ILEVRO) 0.3 % ophthalmic suspension Place 1 drop into the right eye at bedtime.   . pravastatin (PRAVACHOL) 40 MG tablet TAKE 1 TABLET BY MOUTH AT BEDTIME FOR CHOLESTEROL  . triamcinolone cream (KENALOG) 0.1 % Apply 1 application topically daily as needed (rash on Scalp).   . trimethoprim-polymyxin b (POLYTRIM) ophthalmic solution Place 1 drop into the right eye See admin instructions. Monthly after injection in right eye four times a day     Allergies:   Toprol xl [metoprolol tartrate], Lipitor [atorvastatin], Coumadin [warfarin sodium], Tape, and Warfarin   Social History   Socioeconomic History  . Marital status: Married    Spouse name: Not on file  . Number of children: Not on  file  . Years of education: Not on file  . Highest education level: Not on file  Occupational History  . Not on file  Tobacco Use  . Smoking status: Never Smoker  . Smokeless tobacco: Never Used  Vaping Use  . Vaping Use: Never used  Substance and Sexual Activity  . Alcohol use: No  . Drug use: No  . Sexual activity: Not on file  Other Topics Concern  . Not on file  Social History Narrative  . Not on file   Social Determinants of Health   Financial Resource Strain:   . Difficulty of Paying Living Expenses:   Food Insecurity:   . Worried About Charity fundraiser in the Last Year:   . Arboriculturist in the Last Year:   Transportation Needs:   . Film/video editor (Medical):   [name] Lack of Transportation (Non-Medical):   Physical Activity:   . Days of Exercise per Week:   . Minutes of Exercise per Session:   Stress:   . Feeling of Stress :   Social Connections:   . Frequency of Communication with Friends and Family:   . Frequency of Social Gatherings with Friends and Family:   . Attends Religious Services:   . Active Member of  Clubs or Organizations:   . Attends Archivist Meetings:   [name] Marital Status:      Family History: The patient's family history includes Diabetes in his brother; Heart disease in his brother, father, mother, and sister; Other in his brother; Stroke in his father. There is no history of Colon cancer, Esophageal cancer, Stomach cancer, or Rectal cancer.  ROS:   Please see the history of present illness.    All other systems reviewed and are negative.  EKGs/Labs/Other Studies Reviewed:    The following studies were reviewed today: Cardiac Cath 12/17/2019: Conclusion    Previously placed Prox LAD to Mid LAD stent (unknown type) is widely patent.  2nd Diag-1 lesion is 80% stenosed.  Previously placed 2nd Diag-2 stent (unknown type) is widely patent.  Hemodynamic findings consistent with aortic valve stenosis.  Ost LM lesion is 40% stenosed.  Dist LAD lesion is 60% stenosed.    History obtained from chart review.Terry Raschke Crideris a 79 y.o.moderately overweight married Caucasian male father of one child formally a patient of Dr. Carlean Jews. Little's. I last saw5/21/2021. He has a history of CAD status post LAD and diagonal branch stenting back in 2006, 2007 and 2008. He underwent cardiac catheterization by Dr. Nona Dell 06/09/09 revealing widely patent stents after a false positive Myoview. His other problems include chronic branch block, treated hypertension and hyperlipidemia. He denies chest pain or shortness of breath. 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 Since I saw him a year ago he remains completely stable. He has had an issue with his gallbladder and bile leak and has had multiple procedures for this. In addition, his abdominal  ultrasound performed 08/28/2017 shows an increase in his abdominal dimensions from 3.5 to 4.5 cm.  Hehas developed increasing effort angina. His blood pressures been difficult to control as well. His Doppler studies of his kidney arteries performed 06/30/2019 revealed progression of his right renal artery stenosis.I performed Myoview stress test that was nonischemic on 11/26/2019 a 2D echocardiogram on 12/09/2019  that showed severe aortic stenosis with moderate AI and a preserved EF. He also had a moderate pericardial effusion without tamponade physiology.  He presents today for right left heart cath to define his anatomy and physiology.  Distal abdominal aortography-distally abdominal aortogram revealed moderate infrarenal abdominal aortic aneurysm with light widely patent though tortuous iliac arteries.   IMPRESSION: Mr. Williemae Natter LAD and diagonal branch stents are patent.  He does have at least 80% ostial stenosis of D2 which is a small to moderate size vessel although I do not think this is contributing to his symptoms.  The remainder of his coronary anatomy are free of significant disease.  It was difficult crossing his aortic valve with a right Judkins catheter and a straight wire although I finally was able to cross but surprisingly the peak to peak pullback gradient was only 14 mmHg.  I did give the patient hydralazine 10 mg IV for elevated systolic blood pressure.  Is a total of 65 cc of contrast with my max GFR total of being 57 cc.  I did perform abdominal aortography which did reveal a moderate size infrarenal abdominal uric aneurysm and widely patent iliac arteries.  Is a total of 65 cc of contrast, only slightly above his GFR max limit.  Given his severe elevated hypertension, age and aortic stenosis I am electing to keep him overnight for observation and hydration.  The sheath was removed and pressure held.  The patient left lab in stable condition.  Plan will be for discharge home in the  morning with close outpatient follow-up.  Right Heart  Right Heart Pressures Hemodynamic findings consistent with aortic valve stenosis. Right atrial pressure-7/12 Right ventricular pressure-28/0 Pulmonary artery pressure-30/7, mean 17 Pulmonary wedge pressure-A-wave 13, V wave 20, mean 11 LVEDP-16 Aortic valve gradient (peak to peak) 14 mmHg Cardiac output-11.72 L/min with index of 5.85 L/min/m Aortic valve area 2.85 cm  Coronary Diagrams  Diagnostic Dominance: Left  Intervention  Implants   No implant documentation for this case.  Syngo Images  Show images for CARDIAC CATHETERIZATION Images on Long Term Storage  Show images for Niccolas, [place] to Procedure Log  Procedure Log    Hemo Data   Most Recent Value  Fick Cardiac Output 11.72 L/min  Fick Cardiac Output Index 5.85 (L/min)/BSA  Aortic Mean Gradient 18.85 mmHg  Aortic Peak Gradient 14 mmHg  Aortic Valve Area 2.85  Aortic Value Area Index 1.42 cm2/BSA  RA A Wave 7 mmHg  RA V Wave 2 mmHg  RA Mean 1 mmHg  RV Systolic Pressure 28 mmHg  RV Diastolic Pressure -1 mmHg  RV EDP 2 mmHg  PA Systolic Pressure 30 mmHg  PA Diastolic Pressure 7 mmHg  PA Mean 17 mmHg  PW A Wave 13 mmHg  PW V Wave 20 mmHg  PW Mean 11 mmHg  AO Systolic Pressure 449 mmHg  AO Diastolic Pressure 56 mmHg  AO Mean 675 mmHg  LV Systolic Pressure 916 mmHg  LV Diastolic Pressure 10 mmHg  LV EDP 16 mmHg  AOp Systolic Pressure 384 mmHg  AOp Diastolic Pressure 61 mmHg  AOp Mean Pressure 665 mmHg  LVp Systolic Pressure 993 mmHg  LVp Diastolic Pressure 14 mmHg  LVp EDP Pressure 25 mmHg  QP/QS 1  TPVR Index 2.91 HRUI  TSVR Index 18.13 HRUI  PVR SVR Ratio 0.06  TPVR/TSVR Ratio 0.16   Echo 12/09/2019: IMPRESSIONS    1. There is severe aortic stenosis, and at least moderate aortic  regurgitation.  Gradients across AoV partially related to regurgitation  (LVOT VTI 27 cm). V max 4.2 m/s, MG 41 mmHG, AVA 0.95 cm2, DI 0.25. The   aortic valve is tricuspid. Aortic valve  regurgitation is moderate. Severe aortic valve stenosis. Aortic valve  area, by VTI measures 0.95 cm. Aortic valve mean gradient measures 41.0  mmHg. Aortic valve Vmax measures 4.23 m/s.  2. Left ventricular ejection fraction, by estimation, is 60 to 65%. The  left ventricle has normal function. The left ventricle has no regional  wall motion abnormalities. There is mild concentric left ventricular  hypertrophy. Left ventricular diastolic  parameters are consistent with Grade I diastolic dysfunction (impaired  relaxation).  3. Right ventricular systolic function is normal. The right ventricular  size is normal. There is mildly elevated pulmonary artery systolic  pressure. The estimated right ventricular systolic pressure is 54.6 mmHg.  4. Left atrial size was severely dilated.  5. Right atrial size was mildly dilated.  6. Moderate pericardial effusion. The pericardial effusion is  circumferential. There is no evidence of cardiac tamponade.  7. The mitral valve is degenerative. Mild mitral valve regurgitation. No  evidence of mitral stenosis.  8. The inferior vena cava is normal in size with <50% respiratory  variability, suggesting right atrial pressure of 8 mmHg.   Comparison(s): Changes from prior study are noted. There is now a moderate  pericardial effusion without signs of tamponade. Aortic stenosis is now  severe with moderate regurgitation. EF remains normal.   LEFT VENTRICLE  PLAX 2D  LVIDd:     5.40 cm Diastology  LVIDs:     3.10 cm LV e' lateral:  4.24 cm/s  LV PW:     1.20 cm LV E/e' lateral: 16.2  LV IVS:    1.00 cm LV e' medial:  5.11 cm/s  LVOT diam:   2.20 cm LV E/e' medial: 13.4  LV SV:     106  LV SV Index:  53    2D Longitudinal Strain  LVOT Area:   3.80 cm 2D Strain GLS (A2C):  -19.6 %             2D Strain GLS (A3C):  -19.3 %             2D  Strain GLS (A4C):  -19.3 %             2D Strain GLS Avg:   -19.4 %               3D Volume EF:             3D EF:    63 %             LV EDV:    176 ml             LV ESV:    65 ml             LV SV:    111 ml   RIGHT VENTRICLE       IVC  RV Basal diam: 3.30 cm   IVC diam: 2.00 cm  RV S prime:   12.90 cm/s  TAPSE (M-mode): 3.5 cm   LEFT ATRIUM       Index    RIGHT ATRIUM      Index  LA diam:    5.50 cm 2.74 cm/m RA Area:   13.60 cm  LA Vol (A2C):  134.0 ml 66.73 ml/m RA Volume:  30.40 ml 15.14 ml/m  LA Vol (A4C):  91.1 ml 45.37 ml/m  LA Biplane Vol: 110.0 ml 54.78 ml/m  AORTIC VALVE  AV Area (Vmax):  0.91 cm  AV Area (Vmean):  1.03 cm  AV Area (VTI):   0.95 cm  AV Vmax:      423.00 cm/s  AV Vmean:     273.400 cm/s  AV VTI:      1.110 m  AV Peak Grad:   71.6 mmHg  AV Mean Grad:   41.0 mmHg  LVOT Vmax:     100.75 cm/s  LVOT Vmean:    74.150 cm/s  LVOT VTI:     0.278 m  LVOT/AV VTI ratio: 0.25  AI PHT:      323 msec    AORTA  Ao Root diam: 3.40 cm  Ao Asc diam: 3.20 cm   MITRAL VALVE        TRICUSPID VALVE  MV Area (PHT): 2.95 cm   TR Peak grad:  28.3 mmHg  MV Decel Time: 257 msec   TR Vmax:    266.00 cm/s  MV E velocity: 68.60 cm/s  MV A velocity: 127.00 cm/s SHUNTS  MV E/A ratio: 0.54     Systemic VTI: 0.28 m               Systemic Diam: 2.20 cm   EKG:  EKG is not ordered today.    Recent Labs: 11/13/2019: ALT 15 12/18/2019: BUN 46; Creatinine, Ser 2.67; Hemoglobin 7.8; Platelets 137; Potassium 3.8; Sodium 143  Recent Lipid Panel    Component Value Date/Time   CHOL 98 (L) 11/13/2019 0837   TRIG 97 11/13/2019 0837   HDL 25 (L) 11/13/2019 0837   CHOLHDL 3.9 11/13/2019 0837   CHOLHDL 4.7 07/15/2017 1405   VLDL 34 (H) 12/20/2016 0924     LDLCALC 54 11/13/2019 0837   LDLCALC 73 07/15/2017 1405    Physical Exam:    VS:  BP (!) 148/64   Pulse 77   Ht '6\' 2"'$  (1.88 m)   Wt 164 lb 3.2 oz (74.5 kg)   SpO2 95%   BMI 21.08 kg/m     Wt Readings from Last 3 Encounters:  12/24/19 164 lb 3.2 oz (74.5 kg)  12/17/19 165 lb (74.8 kg)  12/10/19 165 lb (74.8 kg)     GEN: Elderly, hard of hearing male in no acute distress HEENT: Normal NECK: No JVD; prominent bilateral carotid bruits LYMPHATICS: No lymphadenopathy CARDIAC: RRR, 3/6 harsh crescendo decrescendo murmur at the right upper sternal border, no diastolic murmur appreciable RESPIRATORY:  Clear to auscultation without rales, wheezing or rhonchi  ABDOMEN: Soft, non-tender, non-distended MUSCULOSKELETAL:  No edema; No deformity  SKIN: Warm and dry NEUROLOGIC:  Alert and oriented x 3 PSYCHIATRIC:  Normal affect   ASSESSMENT:    1. Severe aortic stenosis   2. Pericardial effusion    PLAN:    In order of problems listed above:  50. 79 year old gentleman with severe, stage D1 aortic stenosis with at least moderate aortic valve insufficiency.  He exhibits New York Heart Association functional class II symptoms of chronic diastolic heart failure.  I have reviewed his cardiac catheterization and echo findings as outlined above in the HPI. I have reviewed the natural history of aortic stenosis with the patient and his wife who is present today. We have discussed the limitations of medical therapy and the poor prognosis associated with symptomatic aortic stenosis. We have reviewed potential treatment options, including palliative medical therapy, conventional surgical  aortic valve replacement, and transcatheter aortic valve replacement. We discussed treatment options in the context of the patient's specific comorbid medical conditions.  In order to further evaluate him as a candidate for TAVR, he will require a cardiac CTA and a CTA of the chest, abdomen, and pelvis.  I am  concerned about proceeding in light of his chronic kidney disease.  I think he will ultimately require transcatheter aortic valve replacement (TAVR), but I have recommended that he undergo nephrology consultation before proceeding.  He was previously followed by Dr. Mercy Moore and he has seen Dr. Marval Regal in the past.  We will place referral to get him back in with nephrology.  The patient has significant anemia as outlined above with most recent hemoglobin of 7.8.  He may require iron infusion or other treatment of what I suspect is anemia related to chronic kidney disease.  I am also concerned about the presence of a pericardial effusion which appears to be moderate by echo criteria.  The patient may have an overlap with amyloidosis and he will undergo lab testing with myeloma panel and a transthyretin nuclear scan.  Once his studies are completed and he has undergone nephrology evaluation, I would like to see him back in follow-up which I will tentatively arrange in about 3 months.  This should allow some time to get him on the right track for treatment of his anemia.  Medication Adjustments/Labs and Tests Ordered: Current medicines are reviewed at length with the patient today.  Concerns regarding medicines are outlined above.  Orders Placed This Encounter  Procedures  . Multiple Myeloma Panel (SPEP&IFE w/QIG)  . Basic metabolic panel  . MYOCARDIAL AMYLOID IMAGING PLANAR AND SPECT   No orders of the defined types were placed in this encounter.   Patient Instructions  Medication Instructions:  Your provider recommends that you continue on your current medications as directed. Please refer to the Current Medication list given to you today.   *If you need a refill on your cardiac medications before your next appointment, please call your pharmacy*  Lab Work: TODAY! Myeloma panel, BMET If you have labs (blood work) drawn today and your tests are completely normal, you will receive your results  only by: [name] MyChart Message (if you have MyChart) OR . A paper copy in the mail If you have any lab test that is abnormal or we need to change your treatment, we will call you to review the results.  Testing/Procedures: Dr. Burt Knack recommends you have an AMYLOID SCAN.  Follow-Up: You have an appointment with Dr. Burt Knack on 03/28/2020 at 11:20AM.     Signed, Sherren Mocha, MD  12/24/2019 1:53 PM    [facility]

## 2019-12-24 NOTE — Patient Instructions (Addendum)
Medication Instructions:  Your provider recommends that you continue on your current medications as directed. Please refer to the Current Medication list given to you today.   *If you need a refill on your cardiac medications before your next appointment, please call your pharmacy*  Lab Work: TODAY! Myeloma panel, BMET If you have labs (blood work) drawn today and your tests are completely normal, you will receive your results only by: [name] MyChart Message (if you have MyChart) OR . A paper copy in the mail If you have any lab test that is abnormal or we need to change your treatment, we will call you to review the results.  Testing/Procedures: Dr. Burt Knack recommends you have an AMYLOID SCAN.  Follow-Up: You have an appointment with Dr. Burt Knack on 03/28/2020 at 11:20AM.

## 2019-12-26 LAB — MULTIPLE MYELOMA PANEL, SERUM
Albumin SerPl Elph-Mcnc: 3.4 g/dL (ref 2.9–4.4)
Albumin/Glob SerPl: 1.5 (ref 0.7–1.7)
Alpha 1: 0.3 g/dL (ref 0.0–0.4)
Alpha2 Glob SerPl Elph-Mcnc: 0.6 g/dL (ref 0.4–1.0)
B-Globulin SerPl Elph-Mcnc: 1 g/dL (ref 0.7–1.3)
Gamma Glob SerPl Elph-Mcnc: 0.6 g/dL (ref 0.4–1.8)
Globulin, Total: 2.4 g/dL (ref 2.2–3.9)
IgA/Immunoglobulin A, Serum: 150 mg/dL (ref 61–437)
IgG (Immunoglobin G), Serum: 590 mg/dL — ABNORMAL LOW (ref 603–1613)
IgM (Immunoglobulin M), Srm: 44 mg/dL (ref 15–143)
Total Protein: 5.8 g/dL — ABNORMAL LOW (ref 6.0–8.5)

## 2019-12-26 LAB — BASIC METABOLIC PANEL
BUN/Creatinine Ratio: 16 (ref 10–24)
BUN: 47 mg/dL — ABNORMAL HIGH (ref 8–27)
CO2: 24 mmol/L (ref 20–29)
Calcium: 8.7 mg/dL (ref 8.6–10.2)
Chloride: 105 mmol/L (ref 96–106)
Creatinine, Ser: 2.89 mg/dL — ABNORMAL HIGH (ref 0.76–1.27)
GFR calc Af Amer: 23 mL/min/{1.73_m2} — ABNORMAL LOW (ref >59)
GFR calc non Af Amer: 19 mL/min/{1.73_m2} — ABNORMAL LOW (ref >59)
Glucose: 92 mg/dL (ref 65–99)
Potassium: 4.4 mmol/L (ref 3.5–5.2)
Sodium: 142 mmol/L (ref 134–144)

## 2019-12-28 ENCOUNTER — Inpatient Hospital Stay (HOSPITAL_COMMUNITY)
Admission: EM | Admit: 2019-12-28 | Discharge: 2020-01-01 | DRG: 252 | Disposition: A | Discharge status: Home / Self Care | Payer: Medicare Other | Attending: Internal Medicine | Admitting: Internal Medicine

## 2019-12-28 ENCOUNTER — Emergency Department (HOSPITAL_COMMUNITY): Payer: Medicare Other

## 2019-12-28 ENCOUNTER — Encounter (HOSPITAL_COMMUNITY): Payer: Self-pay

## 2019-12-28 ENCOUNTER — Other Ambulatory Visit: Payer: Self-pay

## 2019-12-28 DIAGNOSIS — Z7982 Long term (current) use of aspirin: Secondary | ICD-10-CM | POA: Diagnosis not present

## 2019-12-28 DIAGNOSIS — I209 Angina pectoris, unspecified: Secondary | ICD-10-CM

## 2019-12-28 DIAGNOSIS — E782 Mixed hyperlipidemia: Secondary | ICD-10-CM | POA: Diagnosis present

## 2019-12-28 DIAGNOSIS — I352 Nonrheumatic aortic (valve) stenosis with insufficiency: Secondary | ICD-10-CM | POA: Diagnosis not present

## 2019-12-28 DIAGNOSIS — I351 Nonrheumatic aortic (valve) insufficiency: Secondary | ICD-10-CM | POA: Diagnosis not present

## 2019-12-28 DIAGNOSIS — Z888 Allergy status to other drugs, medicaments and biological substances status: Secondary | ICD-10-CM

## 2019-12-28 DIAGNOSIS — E785 Hyperlipidemia, unspecified: Secondary | ICD-10-CM | POA: Diagnosis not present

## 2019-12-28 DIAGNOSIS — I200 Unstable angina: Secondary | ICD-10-CM | POA: Diagnosis not present

## 2019-12-28 DIAGNOSIS — D631 Anemia in chronic kidney disease: Secondary | ICD-10-CM | POA: Diagnosis not present

## 2019-12-28 DIAGNOSIS — I444 Left anterior fascicular block: Secondary | ICD-10-CM | POA: Diagnosis not present

## 2019-12-28 DIAGNOSIS — K7460 Unspecified cirrhosis of liver: Secondary | ICD-10-CM | POA: Diagnosis not present

## 2019-12-28 DIAGNOSIS — Z96642 Presence of left artificial hip joint: Secondary | ICD-10-CM | POA: Diagnosis present

## 2019-12-28 DIAGNOSIS — Z9049 Acquired absence of other specified parts of digestive tract: Secondary | ICD-10-CM | POA: Diagnosis not present

## 2019-12-28 DIAGNOSIS — I714 Abdominal aortic aneurysm, without rupture, unspecified: Secondary | ICD-10-CM | POA: Diagnosis present

## 2019-12-28 DIAGNOSIS — J90 Pleural effusion, not elsewhere classified: Secondary | ICD-10-CM | POA: Diagnosis present

## 2019-12-28 DIAGNOSIS — Z7902 Long term (current) use of antithrombotics/antiplatelets: Secondary | ICD-10-CM

## 2019-12-28 DIAGNOSIS — I509 Heart failure, unspecified: Secondary | ICD-10-CM | POA: Diagnosis not present

## 2019-12-28 DIAGNOSIS — D6859 Other primary thrombophilia: Secondary | ICD-10-CM | POA: Diagnosis not present

## 2019-12-28 DIAGNOSIS — I313 Pericardial effusion (noninflammatory): Secondary | ICD-10-CM | POA: Diagnosis present

## 2019-12-28 DIAGNOSIS — I10 Essential (primary) hypertension: Secondary | ICD-10-CM | POA: Diagnosis not present

## 2019-12-28 DIAGNOSIS — I348 Other nonrheumatic mitral valve disorders: Secondary | ICD-10-CM | POA: Diagnosis not present

## 2019-12-28 DIAGNOSIS — Z79899 Other long term (current) drug therapy: Secondary | ICD-10-CM

## 2019-12-28 DIAGNOSIS — Z91048 Other nonmedicinal substance allergy status: Secondary | ICD-10-CM

## 2019-12-28 DIAGNOSIS — N184 Chronic kidney disease, stage 4 (severe): Secondary | ICD-10-CM | POA: Diagnosis not present

## 2019-12-28 DIAGNOSIS — K089 Disorder of teeth and supporting structures, unspecified: Secondary | ICD-10-CM

## 2019-12-28 DIAGNOSIS — E7800 Pure hypercholesterolemia, unspecified: Secondary | ICD-10-CM | POA: Diagnosis not present

## 2019-12-28 DIAGNOSIS — I6529 Occlusion and stenosis of unspecified carotid artery: Secondary | ICD-10-CM | POA: Diagnosis present

## 2019-12-28 DIAGNOSIS — Z01818 Encounter for other preprocedural examination: Secondary | ICD-10-CM | POA: Diagnosis not present

## 2019-12-28 DIAGNOSIS — I701 Atherosclerosis of renal artery: Secondary | ICD-10-CM | POA: Diagnosis not present

## 2019-12-28 DIAGNOSIS — N179 Acute kidney failure, unspecified: Secondary | ICD-10-CM | POA: Diagnosis not present

## 2019-12-28 DIAGNOSIS — I350 Nonrheumatic aortic (valve) stenosis: Secondary | ICD-10-CM

## 2019-12-28 DIAGNOSIS — Z8249 Family history of ischemic heart disease and other diseases of the circulatory system: Secondary | ICD-10-CM

## 2019-12-28 DIAGNOSIS — R079 Chest pain, unspecified: Secondary | ICD-10-CM | POA: Diagnosis not present

## 2019-12-28 DIAGNOSIS — N186 End stage renal disease: Secondary | ICD-10-CM | POA: Diagnosis not present

## 2019-12-28 DIAGNOSIS — Z20822 Contact with and (suspected) exposure to covid-19: Secondary | ICD-10-CM | POA: Diagnosis not present

## 2019-12-28 DIAGNOSIS — I120 Hypertensive chronic kidney disease with stage 5 chronic kidney disease or end stage renal disease: Secondary | ICD-10-CM | POA: Diagnosis not present

## 2019-12-28 DIAGNOSIS — I129 Hypertensive chronic kidney disease with stage 1 through stage 4 chronic kidney disease, or unspecified chronic kidney disease: Secondary | ICD-10-CM | POA: Diagnosis not present

## 2019-12-28 DIAGNOSIS — R0789 Other chest pain: Secondary | ICD-10-CM | POA: Diagnosis not present

## 2019-12-28 DIAGNOSIS — Z01810 Encounter for preprocedural cardiovascular examination: Secondary | ICD-10-CM | POA: Diagnosis not present

## 2019-12-28 DIAGNOSIS — I2510 Atherosclerotic heart disease of native coronary artery without angina pectoris: Secondary | ICD-10-CM | POA: Diagnosis not present

## 2019-12-28 DIAGNOSIS — I25118 Atherosclerotic heart disease of native coronary artery with other forms of angina pectoris: Secondary | ICD-10-CM | POA: Diagnosis present

## 2019-12-28 DIAGNOSIS — I480 Paroxysmal atrial fibrillation: Secondary | ICD-10-CM | POA: Diagnosis not present

## 2019-12-28 DIAGNOSIS — Z954 Presence of other heart-valve replacement: Secondary | ICD-10-CM | POA: Diagnosis not present

## 2019-12-28 LAB — HEPATIC FUNCTION PANEL
ALT: 18 U/L (ref 0–44)
AST: 45 U/L — ABNORMAL HIGH (ref 15–41)
Albumin: 2.9 g/dL — ABNORMAL LOW (ref 3.5–5.0)
Alkaline Phosphatase: 41 U/L (ref 38–126)
Bilirubin, Direct: 0.4 mg/dL — ABNORMAL HIGH (ref 0.0–0.2)
Indirect Bilirubin: 0.4 mg/dL (ref 0.3–0.9)
Total Bilirubin: 0.8 mg/dL (ref 0.3–1.2)
Total Protein: 5.1 g/dL — ABNORMAL LOW (ref 6.5–8.1)

## 2019-12-28 LAB — TROPONIN I (HIGH SENSITIVITY)
Troponin I (High Sensitivity): 50 ng/L — ABNORMAL HIGH (ref <18)
Troponin I (High Sensitivity): 48 ng/L — ABNORMAL HIGH (ref <18)

## 2019-12-28 LAB — CBC
HCT: 26.7 % — ABNORMAL LOW (ref 39.0–52.0)
Hemoglobin: 8.2 g/dL — ABNORMAL LOW (ref 13.0–17.0)
MCH: 30 pg (ref 26.0–34.0)
MCHC: 30.7 g/dL (ref 30.0–36.0)
MCV: 97.8 fL (ref 80.0–100.0)
Platelets: 147 10*3/uL — ABNORMAL LOW (ref 150–400)
RBC: 2.73 MIL/uL — ABNORMAL LOW (ref 4.22–5.81)
RDW: 15.9 % — ABNORMAL HIGH (ref 11.5–15.5)
WBC: 8.6 10*3/uL (ref 4.0–10.5)
nRBC: 0 % (ref 0.0–0.2)

## 2019-12-28 LAB — SARS CORONAVIRUS 2 BY RT PCR (HOSPITAL ORDER, PERFORMED IN ~~LOC~~ HOSPITAL LAB): SARS Coronavirus 2: NEGATIVE

## 2019-12-28 LAB — BASIC METABOLIC PANEL
Anion gap: 10 (ref 5–15)
BUN: 58 mg/dL — ABNORMAL HIGH (ref 8–23)
CO2: 22 mmol/L (ref 22–32)
Calcium: 8.2 mg/dL — ABNORMAL LOW (ref 8.9–10.3)
Chloride: 106 mmol/L (ref 98–111)
Creatinine, Ser: 3.15 mg/dL — ABNORMAL HIGH (ref 0.61–1.24)
GFR calc Af Amer: 20 mL/min — ABNORMAL LOW (ref >60)
GFR calc non Af Amer: 18 mL/min — ABNORMAL LOW (ref >60)
Glucose, Bld: 150 mg/dL — ABNORMAL HIGH (ref 70–99)
Potassium: 3.7 mmol/L (ref 3.5–5.1)
Sodium: 138 mmol/L (ref 135–145)

## 2019-12-28 LAB — LIPASE, BLOOD: Lipase: 41 U/L (ref 11–51)

## 2019-12-28 LAB — BRAIN NATRIURETIC PEPTIDE: B Natriuretic Peptide: 1117.2 pg/mL — ABNORMAL HIGH (ref 0.0–100.0)

## 2019-12-28 MED ORDER — FERROUS SULFATE 325 (65 FE) MG PO TABS
325.0000 mg | ORAL_TABLET | Freq: Every day | ORAL | Status: DC
  Administered 2019-12-29 – 2019-12-30 (×2): 325 mg via ORAL
  Filled 2019-12-28 (×2): qty 1

## 2019-12-28 MED ORDER — FUROSEMIDE 40 MG PO TABS
40.0000 mg | ORAL_TABLET | Freq: Every day | ORAL | Status: DC
  Administered 2019-12-29 – 2020-01-01 (×4): 40 mg via ORAL
  Filled 2019-12-28 (×4): qty 1

## 2019-12-28 MED ORDER — ISOSORBIDE MONONITRATE ER 30 MG PO TB24
15.0000 mg | ORAL_TABLET | Freq: Two times a day (BID) | ORAL | Status: DC
  Administered 2019-12-28: 15 mg via ORAL
  Filled 2019-12-28 (×2): qty 1

## 2019-12-28 MED ORDER — TRIAMCINOLONE ACETONIDE 0.1 % EX CREA
1.0000 "application " | TOPICAL_CREAM | Freq: Every day | CUTANEOUS | Status: DC | PRN
  Filled 2019-12-28: qty 15

## 2019-12-28 MED ORDER — NITROGLYCERIN 0.4 MG SL SUBL
0.4000 mg | SUBLINGUAL_TABLET | SUBLINGUAL | Status: DC | PRN
  Administered 2019-12-28: 0.4 mg via SUBLINGUAL
  Filled 2019-12-28: qty 1

## 2019-12-28 MED ORDER — FENOFIBRATE 160 MG PO TABS
160.0000 mg | ORAL_TABLET | Freq: Every day | ORAL | Status: AC
  Administered 2019-12-29 – 2019-12-31 (×3): 160 mg via ORAL
  Filled 2019-12-28 (×3): qty 1

## 2019-12-28 MED ORDER — VITAMIN D 25 MCG (1000 UNIT) PO TABS
5000.0000 [IU] | ORAL_TABLET | Freq: Every day | ORAL | Status: DC
  Administered 2019-12-29 – 2020-01-01 (×4): 5000 [IU] via ORAL
  Filled 2019-12-28 (×4): qty 5

## 2019-12-28 MED ORDER — VITAMIN D3 125 MCG (5000 UT) PO TABS: 5000.0000 [IU] | ORAL_TABLET | Freq: Every day | ORAL | Status: DC

## 2019-12-28 MED ORDER — AMLODIPINE BESYLATE 10 MG PO TABS
10.0000 mg | ORAL_TABLET | Freq: Every day | ORAL | Status: DC
  Administered 2019-12-29 – 2020-01-01 (×4): 10 mg via ORAL
  Filled 2019-12-28 (×4): qty 1

## 2019-12-28 MED ORDER — PRAVASTATIN SODIUM 40 MG PO TABS
40.0000 mg | ORAL_TABLET | Freq: Every day | ORAL | Status: DC
  Administered 2019-12-28 – 2020-01-01 (×5): 40 mg via ORAL
  Filled 2019-12-28 (×5): qty 1

## 2019-12-28 MED ORDER — HYDRALAZINE HCL 50 MG PO TABS
50.0000 mg | ORAL_TABLET | Freq: Three times a day (TID) | ORAL | Status: DC
  Administered 2019-12-28: 50 mg via ORAL
  Filled 2019-12-28 (×2): qty 1

## 2019-12-28 MED ORDER — ASPIRIN EC 81 MG PO TBEC
81.0000 mg | DELAYED_RELEASE_TABLET | Freq: Every day | ORAL | Status: DC
  Administered 2019-12-29 – 2020-01-01 (×4): 81 mg via ORAL
  Filled 2019-12-28 (×4): qty 1

## 2019-12-28 MED ORDER — ONDANSETRON HCL 4 MG/2ML IJ SOLN: 4.0000 mg | Freq: Four times a day (QID) | INTRAMUSCULAR | Status: DC | PRN

## 2019-12-28 MED ORDER — SODIUM CHLORIDE 0.45 % IV SOLN: INTRAVENOUS | Status: AC

## 2019-12-28 MED ORDER — CLOPIDOGREL BISULFATE 75 MG PO TABS
75.0000 mg | ORAL_TABLET | Freq: Every day | ORAL | Status: DC
  Administered 2019-12-29 – 2020-01-01 (×4): 75 mg via ORAL
  Filled 2019-12-28 (×4): qty 1

## 2019-12-28 MED ORDER — FENTANYL CITRATE (PF) 100 MCG/2ML IJ SOLN
25.0000 ug | INTRAMUSCULAR | Status: DC | PRN
  Administered 2019-12-28: 25 ug via INTRAVENOUS
  Filled 2019-12-28: qty 2

## 2019-12-28 MED ORDER — ASPIRIN 81 MG PO CHEW
324.0000 mg | CHEWABLE_TABLET | Freq: Once | ORAL | Status: AC
  Administered 2019-12-28: 243 mg via ORAL
  Filled 2019-12-28: qty 4

## 2019-12-28 MED ORDER — ACETAMINOPHEN 325 MG PO TABS: 650.0000 mg | ORAL_TABLET | ORAL | Status: DC | PRN

## 2019-12-28 MED ORDER — SODIUM CHLORIDE 0.9% FLUSH
3.0000 mL | Freq: Once | INTRAVENOUS | Status: AC
  Administered 2019-12-28: 3 mL via INTRAVENOUS

## 2019-12-28 MED ORDER — MORPHINE SULFATE (PF) 2 MG/ML IV SOLN
2.0000 mg | INTRAVENOUS | Status: AC | PRN
  Administered 2019-12-28 (×2): 2 mg via INTRAVENOUS
  Filled 2019-12-28 (×2): qty 1

## 2019-12-28 MED ORDER — NITROGLYCERIN IN D5W 200-5 MCG/ML-% IV SOLN
0.0000 ug/min | INTRAVENOUS | Status: DC
  Administered 2019-12-28: 5 ug/min via INTRAVENOUS
  Administered 2019-12-31: 10 ug/min via INTRAVENOUS
  Filled 2019-12-28 (×2): qty 250

## 2019-12-28 MED ORDER — ALLOPURINOL 300 MG PO TABS
300.0000 mg | ORAL_TABLET | Freq: Every day | ORAL | Status: DC
  Administered 2019-12-29 – 2020-01-01 (×4): 300 mg via ORAL
  Filled 2019-12-28 (×4): qty 1

## 2019-12-28 NOTE — ED Triage Notes (Addendum)
Pt arrives to ED w/ c/o 8/10 constant non-radiating, centrally located chest pain that started at 11 AM today. Pt endorses sob, denies n/v. Pt states pain is worse when taking deep breath. Pt endorses cardiac hx. Pt taking plavix.

## 2019-12-28 NOTE — ED Provider Notes (Signed)
[facility] EMERGENCY DEPARTMENT Provider Note   CSN: [id number] Arrival date & time: 12/28/19  1348     History Chief Complaint  Patient presents with  . Chest Pain    Terry Garner is a 79 y.o. male.  79 yo M with a chief complaints of chest pain.  Feels like this is across the center of his chest and feels like a very sharp and severe pain.  Seems that occurs every time his heartbeats.  Was just in the hospital and had a cardiac catheterization and was told that he had severe aortic stenosis.  Has had the pain since yesterday off and on.  Everything seems to make it worse.  Nothing seems to make it better.  Denies abdominal pain nausea vomiting or diarrhea.  Denies cough congestion.  Denies fever.  The history is provided by the patient.  Chest Pain Pain location:  Substernal area Pain radiates to:  Does not radiate Pain severity:  Moderate Onset quality:  Gradual Duration:  2 days Timing:  Constant Progression:  Worsening Chronicity:  New Relieved by:  Nothing Worsened by:  Nothing Ineffective treatments:  None tried Associated symptoms: shortness of breath   Associated symptoms: no abdominal pain, no fever, no headache, no palpitations and no vomiting        Past Medical History:  Diagnosis Date  . AAA (abdominal aortic aneurysm) (HCC)    3.6 cm (01/09/12 ultrasound)  . Allergy   . Arthritis   . Bile duct leak 09/2017   post op  . Carotid artery disease ([place])   . Cataract   . Chronic kidney disease    CKD, right renal artery stenosis  . Coronary artery disease    mild left internal carotid artery stenosis  . Glaucoma   . Gout   . Heart murmur   . Hyperlipidemia   . Hypertension   . Tubular adenoma of colon 2003   Dr. Watt Climes  . Vitamin D deficiency     Patient Active Problem List   Diagnosis Date Noted  . Aortic stenosis 12/17/2019  . Coronary artery disease of native artery of native heart with stable angina pectoris ([place])   .  Severe aortic stenosis 12/10/2019  . Pain in a tooth or teeth 06/01/2019  . Abnormal weight loss 06/01/2019  . 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 Hearing loss 06/01/2019  . Renal artery stenosis ([place]) 01/09/2019  . Herpes zoster without complication 40/98/1191  . Bile leak 10/08/2017  . Chronic anemia 08/19/2015  . CKD (chronic kidney disease) stage 4, GFR 15-29 ml/min (HCC) 08/19/2015  . BMI 23.0-23.9, adult 05/17/2015  . Abdominal aortic aneurysm ([place]) 12/02/2013  . Medication management 09/01/2013  . Uncontrolled hypertension 07/17/2013  . Other abnormal glucose 07/17/2013  . Gout   . Vitamin D deficiency   . 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 Carotid artery disease ([place]) 01/15/2013  . Right bundle branch block 01/15/2013  . Hyperlipidemia 01/15/2013  . Herniated lumbar intervertebral disc 06/06/2012    Past Surgical History:  Procedure Laterality Date  . ABDOMINAL AORTOGRAM N/A 12/17/2019   Procedure: ABDOMINAL AORTOGRAM;  Surgeon: [name], MD;  Location: [facility];  Service: Cardiovascular;  Laterality: N/A;  . BACK SURGERY    . CARDIAC CATHETERIZATION     stents  last -07  . CARDIAC STENTS    . CERVICAL DISC SURGERY    . CHOLECYSTECTOMY N/A 09/26/2017   Procedure: LAPAROSCOPIC CHOLECYSTECTOMY;  Surgeon: Rolm Bookbinder, MD;  Location:  [facility] OR;  Service: General;  Laterality: N/A;  . ERCP N/A 10/09/2017   Procedure: ENDOSCOPIC RETROGRADE CHOLANGIOPANCREATOGRAPHY (ERCP);  Surgeon: Milus Banister, MD;  Location: [facility];  Service: Endoscopy;  Laterality: N/A;  . EYE SURGERY Left 06   retinal detachment  . HERNIA REPAIR Right 93  . IR RADIOLOGIST EVAL & MGMT  10/29/2017  . LUMBAR LAMINECTOMY  06/06/2012   Procedure: MICRODISCECTOMY LUMBAR LAMINECTOMY;  Surgeon: Marybelle Killings, MD;  Location: [facility];  Service: Orthopedics;  Laterality: Left;  Left L4-5 Microdiscectomy  . RIGHT/LEFT HEART CATH AND CORONARY ANGIOGRAPHY N/A 12/17/2019   Procedure: RIGHT/LEFT HEART CATH AND  CORONARY ANGIOGRAPHY;  Surgeon: [name], MD;  Location: [facility] [facility];  Service: Cardiovascular;  Laterality: N/A;  . SHOULDER ARTHROSCOPY Right   . TOTAL HIP ARTHROPLASTY Left        Family History  Problem Relation Age of Onset  . Other Brother        laryngeal cancer  . Heart disease Brother   . Heart disease Mother   . Heart disease Father   . Stroke Father   . Heart disease Sister   . Diabetes Brother   . Colon cancer Neg Hx   . Esophageal cancer Neg Hx   . Stomach cancer Neg Hx   . Rectal cancer Neg Hx     Social History   Tobacco Use  . Smoking status: Never Smoker  . Smokeless tobacco: Never Used  Vaping Use  . Vaping Use: Never used  Substance Use Topics  . Alcohol use: No  . Drug use: No    Home Medications Prior to Admission medications   Medication Sig Start Date End Date Taking? Authorizing Provider  allopurinol (ZYLOPRIM) 300 MG tablet TAKE 1 TABLET BY MOUTH ONCE DAILY Patient taking differently: Take 300 mg by mouth daily.  03/05/18  Yes Vicie Mutters, PA-C  amLODipine (NORVASC) 10 MG tablet Take 1 tablet (10 mg total) by mouth daily. 12/18/19  Yes [name], Tami Lin, PA  aspirin EC 81 MG tablet Take 81 mg by mouth daily.   Yes [provider]  brimonidine (ALPHAGAN) 0.15 % ophthalmic solution Place 1 drop into both eyes 2 (two) times daily.   Yes [provider]  Cholecalciferol (VITAMIN D3) 5000 UNITS TABS Take 5,000 Units by mouth daily.   Yes [provider]  clopidogrel (PLAVIX) 75 MG tablet TAKE 1 TABLET BY MOUTH ONCE DAILY FOR HEART STENTS Patient taking differently: Take 75 mg by mouth daily.  06/27/17  Yes [name], MD  dorzolamide (TRUSOPT) 2 % ophthalmic solution Place 1 drop into the right eye 2 (two) times daily.    Yes [provider]  fenofibrate micronized (LOFIBRA) 134 MG capsule TAKE 1 CAPSULE BY MOUTH ONCE DAILY Patient taking differently: Take 134 mg by mouth daily before  breakfast.  10/02/17  Yes [name], MD  ferrous sulfate 325 (65 FE) MG tablet Take 325 mg by mouth daily with breakfast.   Yes [provider]  furosemide (LASIX) 40 MG tablet Take 40 mg by mouth daily.  08/29/17  Yes [provider]  hydrALAZINE (APRESOLINE) 50 MG tablet Take 1 tablet (50 mg total) by mouth 3 (three) times daily. Start 12/19/19. 12/18/19 03/17/20 Yes [name], Tami Lin, PA  isosorbide mononitrate (IMDUR) 30 MG 24 hr tablet Take 0.5 tablets (15 mg total) by mouth daily. 11/13/19  Yes [name], MD  ketorolac (ACULAR) 0.5 % ophthalmic solution Place 1  drop into the right eye daily as needed (itching eye).  04/27/19  Yes [provider]  nepafenac (ILEVRO) 0.3 % ophthalmic suspension Place 1 drop into the right eye at bedtime.    Yes [provider]  pravastatin (PRAVACHOL) 40 MG tablet TAKE 1 TABLET BY MOUTH AT BEDTIME FOR CHOLESTEROL Patient taking differently: Take 40 mg by mouth daily.  03/05/18  Yes Vicie Mutters, PA-C  triamcinolone cream (KENALOG) 0.1 % Apply 1 application topically daily as needed (rash on Scalp).  07/27/19  Yes [provider]  trimethoprim-polymyxin b (POLYTRIM) ophthalmic solution Place 1 drop into the right eye See admin instructions. Monthly after injection in right eye four times a day 09/17/19  Yes [provider]    Allergies    Toprol xl [metoprolol tartrate], Lipitor [atorvastatin], Coumadin [warfarin sodium], Tape, and Warfarin  Review of Systems   Review of Systems  Constitutional: Negative for chills and fever.  HENT: Negative for congestion and facial swelling.   Eyes: Negative for discharge and visual disturbance.  Respiratory: Positive for shortness of breath.   Cardiovascular: Positive for chest pain. Negative for palpitations.  Gastrointestinal: Negative for abdominal pain, diarrhea and vomiting.  Musculoskeletal: Negative for arthralgias and myalgias.  Skin: Negative for  color change and rash.  Neurological: Negative for tremors, syncope and headaches.  Psychiatric/Behavioral: Negative for confusion and dysphoric mood.    Physical Exam Updated Vital Signs BP (!) 137/55   Pulse 64   Temp (!) 97.5 F (36.4 C) (Oral)   Resp 19   Ht 6\' 2"  (1.88 m)   Wt 72.6 kg   SpO2 98%   BMI 20.54 kg/m   Physical Exam Vitals and nursing note reviewed.  Constitutional:      Appearance: He is well-developed.  HENT:     Head: Normocephalic and atraumatic.  Eyes:     Pupils: Pupils are equal, round, and reactive to light.  Neck:     Vascular: No JVD.  Cardiovascular:     Rate and Rhythm: Normal rate and regular rhythm.     Heart sounds: No murmur heard.  No friction rub. No gallop.   Pulmonary:     Effort: No respiratory distress.     Breath sounds: No wheezing.  Chest:     Chest wall: Tenderness present.     Comments: Tenderness to palpation of the chest wall  Abdominal:     General: There is no distension.     Tenderness: There is no guarding or rebound.  Musculoskeletal:        General: Normal range of motion.     Cervical back: Normal range of motion and neck supple.  Skin:    Coloration: Skin is not pale.     Findings: No rash.  Neurological:     Mental Status: He is alert and oriented to person, place, and time.  Psychiatric:        Behavior: Behavior normal.     ED Results / Procedures / Treatments   Labs (all labs ordered are listed, but only abnormal results are displayed) Labs Reviewed  BASIC METABOLIC PANEL - Abnormal; Notable for the following components:      Result Value   Glucose, Bld 150 (*)    BUN 58 (*)    Creatinine, Ser 3.15 (*)    Calcium 8.2 (*)    GFR calc non Af Amer 18 (*)    GFR calc Af Amer 20 (*)    All other components within  normal limits  CBC - Abnormal; Notable for the following components:   RBC 2.73 (*)    Hemoglobin 8.2 (*)    HCT 26.7 (*)    RDW 15.9 (*)    Platelets 147 (*)    All other  components within normal limits  HEPATIC FUNCTION PANEL - Abnormal; Notable for the following components:   Total Protein 5.1 (*)    Albumin 2.9 (*)    AST 45 (*)    Bilirubin, Direct 0.4 (*)    All other components within normal limits  TROPONIN I (HIGH SENSITIVITY) - Abnormal; Notable for the following components:   Troponin I (High Sensitivity) 50 (*)    All other components within normal limits  LIPASE, BLOOD  TROPONIN I (HIGH SENSITIVITY)    EKG EKG Interpretation  Date/Time:  Monday December 28 2019 13:52:24 EDT Ventricular Rate:  69 PR Interval:  194 QRS Duration: 140 QT Interval:  410 QTC Calculation: 439 R Axis:   -32 Text Interpretation: Normal sinus rhythm Left axis deviation Right bundle branch block T wave abnormality, consider inferior ischemia Abnormal ECG No significant change since last tracing Confirmed by Deno Etienne [phone]) on 12/28/2019 2:17:43 PM   Radiology DG Chest 2 View  Result Date: 12/28/2019 CLINICAL DATA:  Chest pain.  Eight a 10 pain constant nonradiating EXAM: CHEST - 2 VIEW COMPARISON:  09/25/2017 FINDINGS: Small bilateral pleural effusions are smaller than on the study of 09/25/2017. Obscured LEFT hemidiaphragm. Cardiomediastinal contours are stable.  Heart size mildly enlarged. On limited assessment visualized skeletal structures are unremarkable. IMPRESSION: Small bilateral pleural effusions are smaller than on 09/25/2017. There is increased opacity at the LEFT lung base perhaps in the lingula that may reflect atelectasis or developing infection. Electronically Signed   By: Zetta Bills M.D.   On: 12/28/2019 15:02    Procedures Procedures (including critical care time)  Medications Ordered in ED Medications  nitroGLYCERIN (NITROSTAT) SL tablet 0.4 mg (0.4 mg Sublingual Given 12/28/19 1458)  morphine 2 MG/ML injection 2 mg (2 mg Intravenous Given 12/28/19 1459)  nitroGLYCERIN 50 mg in dextrose 5 % 250 mL (0.2 mg/mL) infusion (has no administration in  time range)  fentaNYL (SUBLIMAZE) injection 25 mcg (has no administration in time range)  sodium chloride flush (NS) 0.9 % injection 3 mL (3 mLs Intravenous Given 12/28/19 1459)  aspirin chewable tablet 324 mg (243 mg Oral Given 12/28/19 1527)    ED Course  I have reviewed the triage vital signs and the nursing notes.  Pertinent labs & imaging results that were available during my care of the patient were reviewed by me and considered in my medical decision making (see chart for details).    MDM Rules/Calculators/A&P                          79 yo M with a cc of chest pain.  This is been going on for the past 48 hours.  Feels like a sharp pain every time his heartbeats.  Denies trauma denies cough denies fever.  Patient's troponin is very mildly elevated at 50.  EKG without concerning changes viewed by me.  I discussed the case with Dr. Harl Bowie, cardiology.  Cardiology will come and evaluate the patient at bedside.  On reassessment the patient's pain seems to be improved but is still described as severe.  Will start on nitroglycerin drip.  Signed out to Dr. Ronnald Nian, please see his note for further details of  care in the ED.  The patients results and plan were reviewed and discussed.   Any x-rays performed were independently reviewed by myself.   Differential diagnosis were considered with the presenting HPI.  Medications  nitroGLYCERIN (NITROSTAT) SL tablet 0.4 mg (0.4 mg Sublingual Given 12/28/19 1458)  morphine 2 MG/ML injection 2 mg (2 mg Intravenous Given 12/28/19 1459)  nitroGLYCERIN 50 mg in dextrose 5 % 250 mL (0.2 mg/mL) infusion (has no administration in time range)  fentaNYL (SUBLIMAZE) injection 25 mcg (has no administration in time range)  sodium chloride flush (NS) 0.9 % injection 3 mL (3 mLs Intravenous Given 12/28/19 1459)  aspirin chewable tablet 324 mg (243 mg Oral Given 12/28/19 1527)    Vitals:   12/28/19 1356 12/28/19 1459  BP: (!) 145/54 (!) 137/55  Pulse: 68 64  Resp:  20 19  Temp: (!) 97.5 F (36.4 C)   TempSrc: Oral   SpO2: 99% 98%  Weight: 72.6 kg   Height: 6\' 2"  (1.88 m)     Final diagnoses:  Chest pain with moderate risk for cardiac etiology      Final Clinical Impression(s) / ED Diagnoses Final diagnoses:  Chest pain with moderate risk for cardiac etiology    Rx / DC Orders ED Discharge Orders    None       Deno Etienne, DO 12/28/19 1626

## 2019-12-28 NOTE — ED Notes (Signed)
MD at bedside, states he does not want the lipid panel until morning.

## 2019-12-28 NOTE — H&P (Signed)
History and Physical    Terry Garner BDZ:[id number] DOB: September 09, 1940 DOA: 12/28/2019  PCP: Carlena Hurl, PA-C (Confirm with patient/family/NH records and if not entered, this has to be entered at [facility] point of entry) Patient coming from: home  I have personally briefly reviewed patient's old medical records in [place]  Chief Complaint: chest pain  HPI: Terry Garner is a 79 y.o. male with medical history significant of CAD with remote PCI of the LAD and diagonal branches, HTN, mixed hyperlipidemia, anemia (Hgb 8-9)stage IV CKD, moderate carotid atherosclerosis, renal artery stenosis, RBBB, and abdominal aortic aneurysm, and recently discovered severe aortic stenosis who is being evaluated for chest pain.This morning at 11AM he had sudden onset chest pain while at rest. It was 10/10 and non-radiating and no associated symptoms. Denies recent fever, chills. He has chronic LLE which is unchanged. Denies weight gain. No worsening orthopnea or sob on exertion. He came to the ED when it persisted   ED Course: In the ED BP 137/55, pulse 64, afebrile, RR 19, 98% O2. Labs showed creatinine 3.15, BUN 58, calcium 8.2, Hgb 8.2. Albumin 2.9. AST 45, HS troponin 50>48. CXR showed small B/L pleural effusions, however small than 09/25/2017, increased opacity in the left lung base, atelectasis vs infection. EKG nonacute. In the ED he was given IV morphine and IV nitro which seemed to help the pain.   Review of Systems: As per HPI otherwise 10 point review of systems negative.   Past Medical History:  Diagnosis Date  . AAA (abdominal aortic aneurysm) (HCC)    3.6 cm (01/09/12 ultrasound)  . Allergy   . Arthritis   . Bile duct leak 09/2017   post op  . Carotid artery disease ([place])   . Cataract   . Chronic kidney disease    CKD, right renal artery stenosis  . Coronary artery disease    mild left internal carotid artery stenosis  . Glaucoma   . Gout   . Heart murmur   . Hyperlipidemia   .  Hypertension   . Tubular adenoma of colon 2003   Dr. Watt Climes  . Vitamin D deficiency     Past Surgical History:  Procedure Laterality Date  . ABDOMINAL AORTOGRAM N/A 12/17/2019   Procedure: ABDOMINAL AORTOGRAM;  Surgeon: [name], MD;  Location: [place] [facility];  Service: Cardiovascular;  Laterality: N/A;  . BACK SURGERY    . CARDIAC CATHETERIZATION     stents  last -07  . CARDIAC STENTS    . CERVICAL DISC SURGERY    . CHOLECYSTECTOMY N/A 09/26/2017   Procedure: LAPAROSCOPIC CHOLECYSTECTOMY;  Surgeon: Rolm Bookbinder, MD;  Location: [place];  Service: General;  Laterality: N/A;  . ERCP N/A 10/09/2017   Procedure: ENDOSCOPIC RETROGRADE CHOLANGIOPANCREATOGRAPHY (ERCP);  Surgeon: Milus Banister, MD;  [facility];  Service: Endoscopy;  Laterality: N/A;  . EYE SURGERY Left 06   retinal detachment  . HERNIA REPAIR Right 93  . IR RADIOLOGIST EVAL & MGMT  10/29/2017  . LUMBAR LAMINECTOMY  06/06/2012   Procedure: MICRODISCECTOMY LUMBAR LAMINECTOMY;  Surgeon: Marybelle Killings, MD;  Location: [place];  Service: Orthopedics;  Laterality: Left;  Left L4-5 Microdiscectomy  . RIGHT/LEFT HEART CATH AND CORONARY ANGIOGRAPHY N/A 12/17/2019   Procedure: RIGHT/LEFT HEART CATH AND CORONARY ANGIOGRAPHY;  Surgeon: [name], MD;  Location: [place] [facility];  Service: Cardiovascular;  Laterality: N/A;  . SHOULDER ARTHROSCOPY Right   . TOTAL HIP ARTHROPLASTY  Left    Soc Hx -  Married 60 + years. One son. Retired from Clinical biochemist work. Lives with his wife. Strong in his faith.   reports that he has never smoked. He has never used smokeless tobacco. He reports that he does not drink alcohol and does not use drugs.  Allergies  Allergen Reactions  . Toprol Xl [Metoprolol Tartrate] Other (See Comments)    Bradycardia with beta blockers  . Lipitor [Atorvastatin]     Myalgias   . Coumadin [Warfarin Sodium] Rash  . Tape Rash    Clear tape  . Warfarin Rash    Family History    Problem Relation Age of Onset  . Other Brother        laryngeal cancer  . Heart disease Brother   . Heart disease Mother   . Heart disease Father   . Stroke Father   . Heart disease Sister   . Diabetes Brother   . Colon cancer Neg Hx   . Esophageal cancer Neg Hx   . Stomach cancer Neg Hx   . Rectal cancer Neg Hx      Prior to Admission medications   Medication Sig Start Date End Date Taking? Authorizing Provider  allopurinol (ZYLOPRIM) 300 MG tablet TAKE 1 TABLET BY MOUTH ONCE DAILY Patient taking differently: Take 300 mg by mouth daily.  03/05/18  Yes Vicie Mutters, PA-C  amLODipine (NORVASC) 10 MG tablet Take 1 tablet (10 mg total) by mouth daily. 12/18/19  Yes [name], Tami Lin, PA  aspirin EC 81 MG tablet Take 81 mg by mouth daily.   Yes [provider]  brimonidine (ALPHAGAN) 0.15 % ophthalmic solution Place 1 drop into both eyes 2 (two) times daily.   Yes [provider]  Cholecalciferol (VITAMIN D3) 5000 UNITS TABS Take 5,000 Units by mouth daily.   Yes [provider]  clopidogrel (PLAVIX) 75 MG tablet TAKE 1 TABLET BY MOUTH ONCE DAILY FOR HEART STENTS Patient taking differently: Take 75 mg by mouth daily.  06/27/17  Yes [name], MD  dorzolamide (TRUSOPT) 2 % ophthalmic solution Place 1 drop into the right eye 2 (two) times daily.    Yes [provider]  fenofibrate micronized (LOFIBRA) 134 MG capsule TAKE 1 CAPSULE BY MOUTH ONCE DAILY Patient taking differently: Take 134 mg by mouth daily before breakfast.  10/02/17  Yes [name], MD  ferrous sulfate 325 (65 FE) MG tablet Take 325 mg by mouth daily with breakfast.   Yes [provider]  furosemide (LASIX) 40 MG tablet Take 40 mg by mouth daily.  08/29/17  Yes [provider]  hydrALAZINE (APRESOLINE) 50 MG tablet Take 1 tablet (50 mg total) by mouth 3 (three) times daily. Start 12/19/19. 12/18/19 03/17/20 Yes [name], Tami Lin, PA  isosorbide mononitrate  (IMDUR) 30 MG 24 hr tablet Take 0.5 tablets (15 mg total) by mouth daily. 11/13/19  Yes [name], MD  ketorolac (ACULAR) 0.5 % ophthalmic solution Place 1 drop into the right eye daily as needed (itching eye).  04/27/19  Yes [provider]  nepafenac (ILEVRO) 0.3 % ophthalmic suspension Place 1 drop into the right eye at bedtime.    Yes [provider]  pravastatin (PRAVACHOL) 40 MG tablet TAKE 1 TABLET BY MOUTH AT BEDTIME FOR CHOLESTEROL Patient taking differently: Take 40 mg by mouth daily.  03/05/18  Yes Vicie Mutters, PA-C  triamcinolone cream (KENALOG) 0.1 % Apply 1 application topically daily as needed (rash on Scalp).  07/27/19  Yes [provider]  trimethoprim-polymyxin b (POLYTRIM) ophthalmic solution Place 1 drop into the right eye See admin instructions. Monthly after injection in right eye four times a day 09/17/19  Yes [provider]    Physical Exam: Vitals:   12/28/19 1930 12/28/19 1945 12/28/19 2000 12/28/19 2015  BP: (!) 147/58 (!) 151/57 (!) 161/63 (!) 151/55  Pulse: 67 66 78 68  Resp: 14 14 20 14   Temp:      TempSrc:      SpO2: 96% 97% 97% 96%  Weight:      Height:        Constitutional: NAD, calm, comfortable Vitals:   12/28/19 1930 12/28/19 1945 12/28/19 2000 12/28/19 2015  BP: (!) 147/58 (!) 151/57 (!) 161/63 (!) 151/55  Pulse: 67 66 78 68  Resp: 14 14 20 14   Temp:      TempSrc:      SpO2: 96% 97% 97% 96%  Weight:      Height:       General:- Elderly man who looks older than his chronologic age and appears chronically ill Eyes: PERRL, lids and conjunctivae normal ENMT: Mucous membranes are moist. Posterior pharynx clear of any exudate or lesions.Normal dentition.  Neck: normal, supple, no masses, no thyromegaly Respiratory: clear to auscultation bilaterally, no wheezing, no crackles. Normal respiratory effort. No accessory muscle use.  Cardiovascular: Regular rate and rhythm, harsh IV/VI holosystolic murmur / no  rubs /no gallops. 2+ pitting edema LE to the knees. 2+ pedal pulses. No carotid bruits.  Abdomen: no tenderness, no masses palpated. No hepatosplenomegaly. Bowel sounds positive.  Musculoskeletal: no clubbing / cyanosis. No joint deformity upper and lower extremities. Good ROM, no contractures. Normal muscle tone.  Skin: Arms are very dark, no open lesions noted Neurologic: CN 2-12 grossly intact.[name]  Psychiatric: Normal judgment and insight. Alert and oriented x 3. Normal mood. HOH, perseverates a little    Labs on Admission: I have personally reviewed following labs and imaging studies  CBC: Recent Labs  Lab 12/28/19 1410  WBC 8.6  HGB 8.2*  HCT 26.7*  MCV 97.8  PLT 527*   Basic Metabolic Panel: Recent Labs  Lab 12/24/19 1314 12/28/19 1410  NA 142 138  K 4.4 3.7  CL 105 106  CO2 24 22  GLUCOSE 92 150*  BUN 47* 58*  CREATININE 2.89* 3.15*  CALCIUM 8.7 8.2*   GFR: Estimated Creatinine Clearance: 18.9 mL/min (A) (by C-G formula based on SCr of 3.15 mg/dL (H)). Liver Function Tests: Recent Labs  Lab 12/24/19 1314 12/28/19 1457  AST  --  45*  ALT  --  18  ALKPHOS  --  41  BILITOT  --  0.8  PROT 5.8* 5.1*  ALBUMIN  --  2.9*   Recent Labs  Lab 12/28/19 1457  LIPASE 41   No results for input(s): AMMONIA in the last 168 hours. Coagulation Profile: No results for input(s): INR, PROTIME in the last 168 hours. Cardiac Enzymes: No results for input(s): CKTOTAL, CKMB, CKMBINDEX, TROPONINI in the last 168 hours. BNP (last 3 results) No results for input(s): PROBNP in the last 8760 hours. HbA1C: No results for input(s): HGBA1C in the last 72 hours. CBG: No results for input(s): GLUCAP in the last 168 hours. Lipid Profile: No results for input(s): CHOL, HDL, LDLCALC, TRIG, CHOLHDL, LDLDIRECT in the last 72 hours. Thyroid Function Tests: No results for input(s): TSH, T4TOTAL, FREET4, T3FREE, THYROIDAB in the last 72 hours. Anemia Panel: No  results for input(s):  VITAMINB12, FOLATE, FERRITIN, TIBC, IRON, RETICCTPCT in the last 72 hours. Urine analysis:    Component Value Date/Time   COLORURINE YELLOW 12/20/2016 0924   APPEARANCEUR CLEAR 12/20/2016 0924   LABSPEC 1.013 12/20/2016 0924   PHURINE 7.0 12/20/2016 0924   GLUCOSEU NEGATIVE 12/20/2016 0924   HGBUR NEGATIVE 12/20/2016 0924   BILIRUBINUR NEGATIVE 12/20/2016 0924   KETONESUR NEGATIVE 12/20/2016 0924   PROTEINUR NEGATIVE 12/20/2016 0924   UROBILINOGEN 0.2 11/15/2014 2052   NITRITE NEGATIVE 12/20/2016 0924   LEUKOCYTESUR NEGATIVE 12/20/2016 0924    Radiological Exams on Admission: DG Chest 2 View  Result Date: 12/28/2019 CLINICAL DATA:  Chest pain.  Eight a 10 pain constant nonradiating EXAM: CHEST - 2 VIEW COMPARISON:  09/25/2017 FINDINGS: Small bilateral pleural effusions are smaller than on the study of 09/25/2017. Obscured LEFT hemidiaphragm. Cardiomediastinal contours are stable.  Heart size mildly enlarged. On limited assessment visualized skeletal structures are unremarkable. IMPRESSION: Small bilateral pleural effusions are smaller than on 09/25/2017. There is increased opacity at the LEFT lung base perhaps in the lingula that may reflect atelectasis or developing infection. Electronically Signed   By: Zetta Bills M.D.   On: 12/28/2019 15:02    EKG: Independently reviewed. NSR, RBBB, T wave inversion V1-V3 consider ischemia  Assessment/Plan Active Problems:   Coronary artery disease of native artery of native heart with stable angina pectoris (HCC)   Uncontrolled hypertension   CKD (chronic kidney disease) stage 4, GFR 15-29 ml/min (HCC)   Hyperlipidemia  (please populate well all problems here in Problem List. (For example, if patient is on BP meds at home and you resume or decide to hold them, it is a problem that needs to be her. Same for CAD, COPD, HLD and so on)   1. Chest pain - see comprehensive cardiology note that summarizes his complex history and diagnostic  findings. Multiple potential etiologies of chest pain.  Plan Per cardiology will admit to cardiac tele  Cardiology to wean nitro qtt  Increase imdur to 15 mg bid  2. HTN - currently mildly hypertensive Plan Close monitoring as Nitro qtt weaned with cardiology to assist in adjusting medications  3. Renal - CKD with acute worsening. Plan  Gentle hydration over the next 12 hours  Nehprology consult - also will assist re: diagnostic testing needed before TVAR - will   Call in AM  4. HLD- continue home meds Plan Lipid panel in AM  5. AS - known problem. Considering TVAR but renal impairment limiting staging studies. Plan Per cardiology and renal  5. Palliative care - had long conversation with patient and his wife about palliation, end of life care and code status. He clearly states that if he has cardiac arrest he would not want to be brought back but his wife isn't ready to change code status. He has clearly stated he would not want HD. Provided them link to [email or web address] that they can review with the help of their son Plan Palliative Care consult - please call in AM  DVT prophylaxis: plavix + asa  Code Status: full code - see discussion above  Family Communication: wife present during interview and exam. Answered questions and explained plan including consults.  Disposition Plan: home when medically stable  Consults called: Cardiology - Dr. Domenic Polite; call placed for nephrology consult thru AMION; call palliative care for consult [place] in AM  Admission status: Obs/tele (inpatient / obs / tele / medical floor / SDU)   Adella Hare MD  [facility] Pager [phone]  If 7PM-7AM, please contact night-coverage [email or web address] Password [facility]  12/28/2019, 8:33 PM

## 2019-12-28 NOTE — ED Provider Notes (Signed)
Assumed care of patient at 4 PM.  Patient here with chest pain.  Initial troponin mildly elevated.  However history of CKD.  EKG with no new ischemic changes.  Did have a recent heart cath.  Does have some coronary disease.  His heart cath showed stable disease.  However ongoing chest pain.  Cardiology to evaluate.  Repeat troponin is stable.  Cardiology recommends admission to medicine.  Will likely continue blood pressure management as likely the cause of his chest pain.  Admitted to medicine in stable condition.  This chart was dictated using voice recognition software.  Despite best efforts to proofread,  errors can occur which can change the documentation meaning.     Lennice Sites, DO 12/28/19 1901

## 2019-12-28 NOTE — Consult Note (Addendum)
Cardiology Admission Consultation:   Patient ID: [name] MRN: [id number]; DOB: [birth year]   Admission date: 12/28/2019  Primary Care Provider: Carlena Hurl, PA-C [facility] Cardiologist: Quay Burow, MD  [place] Electrophysiologist:  None   Chief Complaint:  Chest pain  Patient Profile:   Terry Garner is a 79 y.o. male with pmh of CAD with remote PCI of the LAD and diagonal branches, HTN, mixed hyperlipidemia, anemia (Hgb 8-9)stage IV CKD, moderate carotid atherosclerosis, renal artery stenosis, RBBB, and abdominal aortic aneurysm, and recently discovered severe aortic stenosis who is being evaluated for chest pain.  History of Present Illness:   Terry Garner was seen in May 2021 and was noted to have worsening dyspnea on exertion. A myoview was ordered which showed LVEF 55% with moderate dixed defect in the inferioapical location felt to be thinning vs small prior infarct. With no ischemia it was deteremined to be low risk. Echo was performed which showed severe AS with mean gradient of 31mmHg, aortic valve area 0.95cm2. The patient underwent R/LHC showing patent stents in the LAD and diagonal branches with 80% ostial stenosis of a small to moderate second branch. Medical therapy was felt to be appropriate. Transaortic valve gradient was 40mmHg but patient was severely hypertensive. The patient was referred to Dr. Burt Knack and the structural heart team to discuss further options. It was thought patient will ultimately need TAVR but prior to will need CTA chest/abd/pelvis/cardic. Given CKD and chronic anemia it was recommended he undergo nephrology consultation prior to that. He was previously seen by Dr. Clois Dupes and had seen Dr. Marval Regal as well. Referral was placed. Also given pericardial effusion amyloid work-up was initiated with myeloma panel and transthyretin nuclear scan.   The patient presents to the ED 12/28/19 for chest pain. This morning at 11AM he had sudden  onset chest pain while at rest. It was 10/10 and non-radiating and no associated symptoms. Denies recent fever, chills. He has chronic LLE which is unchanged. Denies weight gain. No worsening orthopnea or sob on exertion. He came to the ED when it persisted.  In the ED BP 137/55, pulse 64, afebrile, RR 19, 98% O2. Labs showed creatinine 3.15, BUN 58, calcium 8.2, Hgb 8.2. Albumin 2.9. AST 45, HS troponin 50>48. CXR showed small B/L pleural effusions, however small than 09/25/2017, increased opacity in the left lung base, atelectasis vs infection. EKG nonacute. In the ED he was given IV morphine and IV nitro which seemed to help the pain.    Past Medical History:  Diagnosis Date  . AAA (abdominal aortic aneurysm) (HCC)    3.6 cm (01/09/12 ultrasound)  . Allergy   . Arthritis   . Bile duct leak 09/2017   post op  . Carotid artery disease ([place])   . Cataract   . Chronic kidney disease    CKD, right renal artery stenosis  . Coronary artery disease    mild left internal carotid artery stenosis  . Glaucoma   . Gout   . Heart murmur   . Hyperlipidemia   . Hypertension   . Tubular adenoma of colon 2003   Dr. Watt Climes  . Vitamin D deficiency     Past Surgical History:  Procedure Laterality Date  . ABDOMINAL AORTOGRAM N/A 12/17/2019   Procedure: ABDOMINAL AORTOGRAM;  Surgeon: [name], MD;  Location: [facility];  Service: Cardiovascular;  Laterality: N/A;  . BACK SURGERY    . CARDIAC CATHETERIZATION     stents  last -07  . CARDIAC STENTS    . CERVICAL DISC SURGERY    . CHOLECYSTECTOMY N/A 09/26/2017   Procedure: LAPAROSCOPIC CHOLECYSTECTOMY;  Surgeon: Rolm Bookbinder, MD;  Location: [place];  Service: General;  Laterality: N/A;  . ERCP N/A 10/09/2017   Procedure: ENDOSCOPIC RETROGRADE CHOLANGIOPANCREATOGRAPHY (ERCP);  Surgeon: Milus Banister, MD;  Location: [facility];  Service: Endoscopy;  Laterality: N/A;  . EYE SURGERY Left 06   retinal detachment  . HERNIA REPAIR  Right 93  . IR RADIOLOGIST EVAL & MGMT  10/29/2017  . LUMBAR LAMINECTOMY  06/06/2012   Procedure: MICRODISCECTOMY LUMBAR LAMINECTOMY;  Surgeon: Marybelle Killings, MD;  Location: [place];  Service: Orthopedics;  Laterality: Left;  Left L4-5 Microdiscectomy  . RIGHT/LEFT HEART CATH AND CORONARY ANGIOGRAPHY N/A 12/17/2019   Procedure: RIGHT/LEFT HEART CATH AND CORONARY ANGIOGRAPHY;  Surgeon: [name], MD;  Location: [facility];  Service: Cardiovascular;  Laterality: N/A;  . SHOULDER ARTHROSCOPY Right   . TOTAL HIP ARTHROPLASTY Left      Medications Prior to Admission: Prior to Admission medications   Medication Sig Start Date End Date Taking? Authorizing Provider  allopurinol (ZYLOPRIM) 300 MG tablet TAKE 1 TABLET BY MOUTH ONCE DAILY Patient taking differently: Take 300 mg by mouth daily.  03/05/18  Yes Vicie Mutters, PA-C  amLODipine (NORVASC) 10 MG tablet Take 1 tablet (10 mg total) by mouth daily. 12/18/19  Yes [name], Tami Lin, PA  aspirin EC 81 MG tablet Take 81 mg by mouth daily.   Yes [provider]  brimonidine (ALPHAGAN) 0.15 % ophthalmic solution Place 1 drop into both eyes 2 (two) times daily.   Yes [provider]  Cholecalciferol (VITAMIN D3) 5000 UNITS TABS Take 5,000 Units by mouth daily.   Yes [provider]  clopidogrel (PLAVIX) 75 MG tablet TAKE 1 TABLET BY MOUTH ONCE DAILY FOR HEART STENTS Patient taking differently: Take 75 mg by mouth daily.  06/27/17  Yes [name], MD  dorzolamide (TRUSOPT) 2 % ophthalmic solution Place 1 drop into the right eye 2 (two) times daily.    Yes [provider]  fenofibrate micronized (LOFIBRA) 134 MG capsule TAKE 1 CAPSULE BY MOUTH ONCE DAILY Patient taking differently: Take 134 mg by mouth daily before breakfast.  10/02/17  Yes [name], MD  ferrous sulfate 325 (65 FE) MG tablet Take 325 mg by mouth daily with breakfast.   Yes [provider]  furosemide (LASIX) 40 MG tablet  Take 40 mg by mouth daily.  08/29/17  Yes [provider]  hydrALAZINE (APRESOLINE) 50 MG tablet Take 1 tablet (50 mg total) by mouth 3 (three) times daily. Start 12/19/19. 12/18/19 03/17/20 Yes [name], Tami Lin, PA  isosorbide mononitrate (IMDUR) 30 MG 24 hr tablet Take 0.5 tablets (15 mg total) by mouth daily. 11/13/19  Yes [name], MD  ketorolac (ACULAR) 0.5 % ophthalmic solution Place 1 drop into the right eye daily as needed (itching eye).  04/27/19  Yes [provider]  nepafenac (ILEVRO) 0.3 % ophthalmic suspension Place 1 drop into the right eye at bedtime.    Yes [provider]  pravastatin (PRAVACHOL) 40 MG tablet TAKE 1 TABLET BY MOUTH AT BEDTIME FOR CHOLESTEROL Patient taking differently: Take 40 mg by mouth daily.  03/05/18  Yes Vicie Mutters, PA-C  triamcinolone cream (KENALOG) 0.1 % Apply 1 application topically daily as needed (rash on Scalp).  07/27/19  Yes [provider]  trimethoprim-polymyxin b (POLYTRIM)  ophthalmic solution Place 1 drop into the right eye See admin instructions. Monthly after injection in right eye four times a day 09/17/19  Yes [provider]     Allergies:    Allergies  Allergen Reactions  . Toprol Xl [Metoprolol Tartrate] Other (See Comments)    Bradycardia with beta blockers  . Lipitor [Atorvastatin]     Myalgias   . Coumadin [Warfarin Sodium] Rash  . Tape Rash    Clear tape  . Warfarin Rash    Social History:   Social History   Socioeconomic History  . Marital status: Married    Spouse name: Not on file  . Number of children: Not on file  . Years of education: Not on file  . Highest education level: Not on file  Occupational History  . Not on file  Tobacco Use  . Smoking status: Never Smoker  . Smokeless tobacco: Never Used  Vaping Use  . Vaping Use: Never used  Substance and Sexual Activity  . Alcohol use: No  . Drug use: No  . Sexual activity: Not on file  Other Topics Concern   . Not on file  Social History Narrative  . Not on file   Social Determinants of Health   Financial Resource Strain:   . Difficulty of Paying Living Expenses:   Food Insecurity:   . Worried About Charity fundraiser in the Last Year:   . Arboriculturist in the Last Year:   Transportation Needs:   . Film/video editor (Medical):   [name] Lack of Transportation (Non-Medical):   Physical Activity:   . Days of Exercise per Week:   . Minutes of Exercise per Session:   Stress:   . Feeling of Stress :   Social Connections:   . Frequency of Communication with Friends and Family:   . Frequency of Social Gatherings with Friends and Family:   . Attends Religious Services:   . Active Member of Clubs or Organizations:   . Attends Archivist Meetings:   [name] Marital Status:   Intimate Partner Violence:   . Fear of Current or Ex-Partner:   . Emotionally Abused:   [name] Physically Abused:   . Sexually Abused:     Family History:   The patient's family history includes Diabetes in his brother; Heart disease in his brother, father, mother, and sister; Other in his brother; Stroke in his father. There is no history of Colon cancer, Esophageal cancer, Stomach cancer, or Rectal cancer.    ROS:  Please see the history of present illness.  All other ROS reviewed and negative.     Physical Exam/Data:   Vitals:   12/28/19 1530 12/28/19 1600 12/28/19 1630 12/28/19 1700  BP: (!) 153/55 (!) 170/58 (!) 165/57 (!) 166/54  Pulse: 65 63 64 64  Resp: 13 20 17 13   Temp:      TempSrc:      SpO2: 98% 97% 97% 97%  Weight:      Height:       No intake or output data in the 24 hours ending 12/28/19 1729 Last 3 Weights 12/28/2019 12/24/2019 12/17/2019  Weight (lbs) 160 lb 164 lb 3.2 oz 165 lb  Weight (kg) 72.576 kg 74.481 kg 74.844 kg     Body mass index is 20.54 kg/m.  General:  Well nourished, well developed, in no acute distress HEENT: normal Lymph: no adenopathy Neck: no JVD Endocrine:  No  thryomegaly Vascular: No carotid bruits;  FA pulses 2+ bilaterally without bruits  Cardiac:  normal S1, S2; RRR; + murmur Lungs:  Decreased at bases Abd: soft, nontender, no hepatomegaly  Ext: 2+ edema Musculoskeletal:  No deformities, BUE and BLE strength normal and equal Skin: warm and dry  Neuro:  CNs 2-12 intact, no focal abnormalities noted Psych:  Normal affect    EKG:  The ECG that was done 12/28/19 was personally reviewed and demonstrates NSR, LAD, RBBB, nonspecific TW changes  Relevant CV Studies:  Cardiac cath 12/17/19 with abdominal aortogram  Previously placed Prox LAD to Mid LAD stent (unknown type) is widely patent.  2nd Diag-1 lesion is 80% stenosed.  Previously placed 2nd Diag-2 stent (unknown type) is widely patent.  Hemodynamic findings consistent with aortic valve stenosis.  Ost LM lesion is 40% stenosed.  Dist LAD lesion is 60% stenosed.  IMPRESSION: Terry Garner LAD and diagonal branch stents are patent.  He does have at least 80% ostial stenosis of D2 which is a small to moderate size vessel although I do not think this is contributing to his symptoms.  The remainder of his coronary anatomy are free of significant disease.  It was difficult crossing his aortic valve with a right Judkins catheter and a straight wire although I finally was able to cross but surprisingly the peak to peak pullback gradient was only 14 mmHg.  I did give the patient hydralazine 10 mg IV for elevated systolic blood pressure.  Is a total of 65 cc of contrast with my max GFR total of being 57 cc.  I did perform abdominal aortography which did reveal a moderate size infrarenal abdominal uric aneurysm and widely patent iliac arteries.  Is a total of 65 cc of contrast, only slightly above his GFR max limit.  Given his severe elevated hypertension, age and aortic stenosis I am electing to keep him overnight for observation and hydration.  The sheath was removed and pressure held.  The patient left  lab in stable condition.  Plan will be for discharge home in the morning with close outpatient follow-up.  Echo 12/09/19 1. There is severe aortic stenosis, and at least moderate aortic  regurgitation. Gradients across AoV partially related to regurgitation  (LVOT VTI 27 cm). V max 4.2 m/s, MG 41 mmHG, AVA 0.95 cm2, DI 0.25. The  aortic valve is tricuspid. Aortic valve  regurgitation is moderate. Severe aortic valve stenosis. Aortic valve  area, by VTI measures 0.95 cm. Aortic valve mean gradient measures 41.0  mmHg. Aortic valve Vmax measures 4.23 m/s.  2. Left ventricular ejection fraction, by estimation, is 60 to 65%. The  left ventricle has normal function. The left ventricle has no regional  wall motion abnormalities. There is mild concentric left ventricular  hypertrophy. Left ventricular diastolic  parameters are consistent with Grade I diastolic dysfunction (impaired  relaxation).  3. Right ventricular systolic function is normal. The right ventricular  size is normal. There is mildly elevated pulmonary artery systolic  pressure. The estimated right ventricular systolic pressure is 78.6 mmHg.  4. Left atrial size was severely dilated.  5. Right atrial size was mildly dilated.  6. Moderate pericardial effusion. The pericardial effusion is  circumferential. There is no evidence of cardiac tamponade.  7. The mitral valve is degenerative. Mild mitral valve regurgitation. No  evidence of mitral stenosis.  8. The inferior vena cava is normal in size with <50% respiratory  variability, suggesting right atrial pressure of 8 mmHg.   Comparison(s): Changes from prior study  are noted. There is now a moderate  pericardial effusion without signs of tamponade. Aortic stenosis is now  severe with moderate regurgitation. EF remains normal.   Laboratory Data:  High Sensitivity Troponin:   Recent Labs  Lab 12/28/19 1410  TROPONINIHS 50*      Chemistry Recent Labs  Lab  12/24/19 1314 12/28/19 1410  NA 142 138  K 4.4 3.7  CL 105 106  CO2 24 22  GLUCOSE 92 150*  BUN 47* 58*  CREATININE 2.89* 3.15*  CALCIUM 8.7 8.2*  GFRNONAA 19* 18*  GFRAA 23* 20*  ANIONGAP  --  10    Recent Labs  Lab 12/24/19 1314 12/28/19 1457  PROT 5.8* 5.1*  ALBUMIN  --  2.9*  AST  --  45*  ALT  --  18  ALKPHOS  --  41  BILITOT  --  0.8   Hematology Recent Labs  Lab 12/28/19 1410  WBC 8.6  RBC 2.73*  HGB 8.2*  HCT 26.7*  MCV 97.8  MCH 30.0  MCHC 30.7  RDW 15.9*  PLT 147*   BNPNo results for input(s): BNP, PROBNP in the last 168 hours.  DDimer No results for input(s): DDIMER in the last 168 hours.   Radiology/Studies:  DG Chest 2 View  Result Date: 12/28/2019 CLINICAL DATA:  Chest pain.  Eight a 10 pain constant nonradiating EXAM: CHEST - 2 VIEW COMPARISON:  09/25/2017 FINDINGS: Small bilateral pleural effusions are smaller than on the study of 09/25/2017. Obscured LEFT hemidiaphragm. Cardiomediastinal contours are stable.  Heart size mildly enlarged. On limited assessment visualized skeletal structures are unremarkable. IMPRESSION: Small bilateral pleural effusions are smaller than on 09/25/2017. There is increased opacity at the LEFT lung base perhaps in the lingula that may reflect atelectasis or developing infection. Electronically Signed   By: Zetta Bills M.D.   On: 12/28/2019 15:02    Assessment and Plan:   Chest pain with severe AS and hypertension in the setting of known CAD  severe sudden onset left sided seemed to improve with IV nitro and morphine. Vitals showed high BP. HS troponin 50>48. CXR with small B/L effusions and possible PNA, however WBC normal. EKG nonacute. CP improved with morphine and IV nitro, now 4/10 - Multiple reasons for chest pain including severe AS, HTN, known CAD, possible fluid overload (although patient reports this is stable).  - Patient had a recent cath showing patent LAD and 2 diag stents patent, 80% stenosis in 2nd  diag-1st lesion, Ost 40% stenosis, Dis LAD lesions 60% stenosed. Given flat troponin trend, CKD and recent cath no further work-up.  - Pressures elevated. On IV nitro. Can wean this and consider increasing Imdur - continue Plavix and aspirin  Hypertension - PTA Hydralazine 50 mg TID, Imdur 30 mg daily, lasix 40 mg daily, amlodipine 10 mg daily - IV nitro>>can wean and increase Imdur - continue to monitor  Severe AS - undergoing OP work-up for TAVR. Needs further imaging but need nephrology evalution given CKD and anemia. Do not suspect would undergo procedure given AKI  Pericardial effusion - moderate by last echo - consider Limited echo - no signs of tamponade on exam  AKI/CKD stage IV - baseline around ?2.6-3. Looks like it has gone up this year compared to last - Scr 3.15 on admission. BUN also up to 58. Albumin 2.9, wonder if he could be third spacing  Anemia - baseline 8-9 - 8.2 on admission    For questions or updates, please contact  [facility] Please consult [email or web address] for contact info under     Signed, Cadence Arlyss Repress  12/28/2019 5:29 PM    Attending note:  Patient seen and examined.  I reviewed his records and discussed the case with Ms. Jorene Minors.  Terry Garner presents to the ER with his wife complaining of recurring chest pressure at rest.  He has a history of severe aortic stenosis with recent TAVR evaluation per Dr. Burt Knack, however there are significant concerns about his higher risk in the setting of CKD stage IV and high risk of contrast nephropathy were he to pursue further angiographic testing.  He sees Dr. Marval Regal and had follow-up pending.  On examination he reports improved chest discomfort after morphine and on IV nitroglycerin infusion..  Systolic blood pressure 735H to 170s, heart rate in the 60s in sinus rhythm by telemetry which I personally reviewed.  Lungs are clear with decreased breath sounds.  Cardiac exam with RRR and 3/6 stock  murmur consistent with aortic stenosis.  He has chronic lower leg edema, 2+ and pitting.  Pertinent lab work includes potassium 3.7, creatinine 3.15, high-sensitivity troponin I levels of 50 and 48, hemoglobin 8.2.  Chest x-ray shows small bilateral pleural effusions, all also increased left left basal opacity.  ECG shows sinus rhythm with right little branch block, left anterior fascicular block, and repolarization abnormalities.  Patient presents with recurring chest discomfort, possibly anginal in the setting of severe aortic stenosis, cardiac enzymes do not suggest ACS however.  He is currently on IV nitroglycerin.  Would recommend admission to the hospitalist service, we will follow up in consultation and focus on modifying medical therapy to improve his symptoms.  It may actually be a good idea to have Dr. Marval Regal see him in the hospital and also potentially ask for a palliative care consultation for further discussion of reasonable goals of care. Pursuing an invasive approach in his case is going to have increased risk and I am not certain that he would want to consider dialysis at all.  Satira Sark, M.D., F.A.C.C.

## 2019-12-29 ENCOUNTER — Encounter (HOSPITAL_COMMUNITY): Payer: Self-pay | Admitting: Internal Medicine

## 2019-12-29 DIAGNOSIS — I209 Angina pectoris, unspecified: Secondary | ICD-10-CM | POA: Diagnosis not present

## 2019-12-29 DIAGNOSIS — Z7982 Long term (current) use of aspirin: Secondary | ICD-10-CM | POA: Diagnosis not present

## 2019-12-29 DIAGNOSIS — Z79899 Other long term (current) drug therapy: Secondary | ICD-10-CM | POA: Diagnosis not present

## 2019-12-29 DIAGNOSIS — D631 Anemia in chronic kidney disease: Secondary | ICD-10-CM | POA: Diagnosis present

## 2019-12-29 DIAGNOSIS — N179 Acute kidney failure, unspecified: Secondary | ICD-10-CM | POA: Diagnosis present

## 2019-12-29 DIAGNOSIS — Z91048 Other nonmedicinal substance allergy status: Secondary | ICD-10-CM | POA: Diagnosis not present

## 2019-12-29 DIAGNOSIS — J90 Pleural effusion, not elsewhere classified: Secondary | ICD-10-CM | POA: Diagnosis present

## 2019-12-29 DIAGNOSIS — I25118 Atherosclerotic heart disease of native coronary artery with other forms of angina pectoris: Secondary | ICD-10-CM | POA: Diagnosis present

## 2019-12-29 DIAGNOSIS — I714 Abdominal aortic aneurysm, without rupture: Secondary | ICD-10-CM | POA: Diagnosis present

## 2019-12-29 DIAGNOSIS — I6529 Occlusion and stenosis of unspecified carotid artery: Secondary | ICD-10-CM | POA: Diagnosis present

## 2019-12-29 DIAGNOSIS — Z9049 Acquired absence of other specified parts of digestive tract: Secondary | ICD-10-CM | POA: Diagnosis not present

## 2019-12-29 DIAGNOSIS — I351 Nonrheumatic aortic (valve) insufficiency: Secondary | ICD-10-CM | POA: Diagnosis not present

## 2019-12-29 DIAGNOSIS — R079 Chest pain, unspecified: Secondary | ICD-10-CM | POA: Diagnosis present

## 2019-12-29 DIAGNOSIS — Z96642 Presence of left artificial hip joint: Secondary | ICD-10-CM | POA: Diagnosis present

## 2019-12-29 DIAGNOSIS — I120 Hypertensive chronic kidney disease with stage 5 chronic kidney disease or end stage renal disease: Secondary | ICD-10-CM | POA: Diagnosis present

## 2019-12-29 DIAGNOSIS — I352 Nonrheumatic aortic (valve) stenosis with insufficiency: Secondary | ICD-10-CM | POA: Diagnosis present

## 2019-12-29 DIAGNOSIS — N186 End stage renal disease: Secondary | ICD-10-CM | POA: Diagnosis present

## 2019-12-29 DIAGNOSIS — Z7902 Long term (current) use of antithrombotics/antiplatelets: Secondary | ICD-10-CM | POA: Diagnosis not present

## 2019-12-29 DIAGNOSIS — E782 Mixed hyperlipidemia: Secondary | ICD-10-CM | POA: Diagnosis present

## 2019-12-29 DIAGNOSIS — Z954 Presence of other heart-valve replacement: Secondary | ICD-10-CM | POA: Diagnosis not present

## 2019-12-29 DIAGNOSIS — N184 Chronic kidney disease, stage 4 (severe): Secondary | ICD-10-CM | POA: Diagnosis not present

## 2019-12-29 DIAGNOSIS — I350 Nonrheumatic aortic (valve) stenosis: Secondary | ICD-10-CM | POA: Diagnosis not present

## 2019-12-29 DIAGNOSIS — I444 Left anterior fascicular block: Secondary | ICD-10-CM | POA: Diagnosis present

## 2019-12-29 DIAGNOSIS — K7460 Unspecified cirrhosis of liver: Secondary | ICD-10-CM | POA: Diagnosis present

## 2019-12-29 DIAGNOSIS — Z888 Allergy status to other drugs, medicaments and biological substances status: Secondary | ICD-10-CM | POA: Diagnosis not present

## 2019-12-29 DIAGNOSIS — I701 Atherosclerosis of renal artery: Secondary | ICD-10-CM

## 2019-12-29 DIAGNOSIS — I200 Unstable angina: Secondary | ICD-10-CM | POA: Diagnosis not present

## 2019-12-29 DIAGNOSIS — E7800 Pure hypercholesterolemia, unspecified: Secondary | ICD-10-CM

## 2019-12-29 DIAGNOSIS — I313 Pericardial effusion (noninflammatory): Secondary | ICD-10-CM | POA: Diagnosis present

## 2019-12-29 DIAGNOSIS — Z20822 Contact with and (suspected) exposure to covid-19: Secondary | ICD-10-CM | POA: Diagnosis present

## 2019-12-29 DIAGNOSIS — D6859 Other primary thrombophilia: Secondary | ICD-10-CM | POA: Diagnosis present

## 2019-12-29 DIAGNOSIS — Z8249 Family history of ischemic heart disease and other diseases of the circulatory system: Secondary | ICD-10-CM | POA: Diagnosis not present

## 2019-12-29 DIAGNOSIS — I480 Paroxysmal atrial fibrillation: Secondary | ICD-10-CM | POA: Diagnosis not present

## 2019-12-29 LAB — LIPID PANEL
Cholesterol: 81 mg/dL (ref 0–200)
HDL: 20 mg/dL — ABNORMAL LOW (ref >40)
LDL Cholesterol: 46 mg/dL (ref 0–99)
Total CHOL/HDL Ratio: 4.1 RATIO
Triglycerides: 75 mg/dL (ref <150)
VLDL: 15 mg/dL (ref 0–40)

## 2019-12-29 MED ORDER — ISOSORBIDE MONONITRATE ER 30 MG PO TB24
30.0000 mg | ORAL_TABLET | Freq: Two times a day (BID) | ORAL | Status: DC
  Administered 2019-12-29 – 2020-01-01 (×7): 30 mg via ORAL
  Filled 2019-12-29 (×6): qty 1

## 2019-12-29 MED ORDER — HYDRALAZINE HCL 25 MG PO TABS
25.0000 mg | ORAL_TABLET | Freq: Three times a day (TID) | ORAL | Status: DC
  Administered 2019-12-29 – 2020-01-01 (×7): 25 mg via ORAL
  Filled 2019-12-29 (×7): qty 1

## 2019-12-29 NOTE — Plan of Care (Signed)

## 2019-12-29 NOTE — Consult Note (Signed)
Reason for Consult: AKI/CKD stage IV Referring Physician:  Linda Hedges, MD  Terry Garner is an 79 y.o. male has an extensive PMH significant for severe AS, CAD, HTN, HLD, ASCVD (carotid artery atherosclerosis, right renal artery stenosis, and AAA), anemia of chronic disease, and CKD stage IV (baseline Scr has been 2.6-3.4 since 2015) who presented to [facility] ED on 12/28/19 with sudden onset chest pain.  He reports that it occurred while he was sitting in a chair and was described as sharp, substernal pain that was very severe (10/10).  It was non-radiating and denied any associated N/V/SOB/diaphoresis.  In the ED he was noted have bilateral pleural effusions on CXR, and elevated troponin, and was given IV morphine and nitro.  He reports that the pain lasted for several hours until he was started on the IV ntg.  He was admitted for further evaluation.  Of note, he had a cardiac catheterization on 12/17/19 to evaluate his unstable angina and severe aortic stenosis.  The cath was notable for patent LAD stent, 2nd Diag 80% stenosed, ostial LM of 40% stenosis, distal LAD 60% stenosis. His Scr remained stable following cardiac cath.  He is being evaluated for TAVR and we were consulted due to his advanced CKD stage IV and the need for further IV contrasted studies.  He had been followed in our office since 2017, however he was lost to follow up since August 2019.  He also reports having increased lower extremity edema and was placed on a low sodium diet prior to his admission.  The trend in Scr is seen below.   Trend in Creatinine: Creatinine, Ser  Date/Time Value Ref Range Status  12/28/2019 02:10 PM 3.15 (H) 0.61 - 1.24 mg/dL Final  12/24/2019 01:14 PM 2.89 (H) 0.76 - 1.27 mg/dL Final  12/18/2019 04:09 AM 2.67 (H) 0.61 - 1.24 mg/dL Final  12/10/2019 12:09 PM 2.96 (H) 0.76 - 1.27 mg/dL Final  11/13/2019 08:37 AM 3.34 (H) 0.76 - 1.27 mg/dL Final  01/09/2019 09:16 AM 3.24 (H) 0.76 - 1.27 mg/dL Final  10/17/2017  03:55 AM 2.16 (H) 0.61 - 1.24 mg/dL Final  10/16/2017 08:16 AM 2.33 (H) 0.61 - 1.24 mg/dL Final  10/11/2017 05:38 AM 2.17 (H) 0.61 - 1.24 mg/dL Final  10/10/2017 04:01 AM 2.19 (H) 0.61 - 1.24 mg/dL Final  10/08/2017 03:25 AM 2.05 (H) 0.61 - 1.24 mg/dL Final  10/07/2017 10:52 AM 2.28 (H) 0.61 - 1.24 mg/dL Final  10/01/2017 03:56 AM 2.21 (H) 0.61 - 1.24 mg/dL Final  09/30/2017 07:07 AM 2.16 (H) 0.61 - 1.24 mg/dL Final  09/29/2017 03:57 AM 2.17 (H) 0.61 - 1.24 mg/dL Final  09/28/2017 12:01 AM 2.12 (H) 0.61 - 1.24 mg/dL Final  09/27/2017 06:32 AM 2.16 (H) 0.61 - 1.24 mg/dL Final  09/26/2017 06:14 AM 2.17 (H) 0.61 - 1.24 mg/dL Final  09/25/2017 04:03 AM 2.61 (H) 0.61 - 1.24 mg/dL Final  09/24/2017 03:43 AM 2.41 (H) 0.61 - 1.24 mg/dL Final  09/23/2017 10:48 AM 2.59 (H) 0.61 - 1.24 mg/dL Final  07/15/2017 02:05 PM 2.80 (H) 0.70 - 1.18 mg/dL Final  06/27/2017 01:37 PM 2.92 (H) 0.61 - 1.24 mg/dL Final  11/15/2014 08:46 PM 3.02 (H) 0.61 - 1.24 mg/dL Final  01/27/2014 09:41 AM 2.7 (H) 0.40 - 1.50 mg/dL Final  04/23/2013 09:16 AM 2.11 (H) 0.50 - 1.35 mg/dL Final  06/06/2012 06:22 AM 2.23 (H) 0.50 - 1.35 mg/dL Final  06/04/2012 10:32 AM 2.42 (H) 0.50 - 1.35 mg/dL Final  06/10/2009 06:07 AM 1.59 (  H) 0.40 - 1.50 mg/dL Final  06/09/2009 05:20 AM 1.72 (H) 0.40 - 1.50 mg/dL Final  06/08/2009 05:00 AM 1.79 (H) 0.40 - 1.50 mg/dL Final  06/07/2009 08:25 AM 1.69 (H) 0.40 - 1.50 mg/dL Final  01/31/2007 03:45 AM 1.81 (H)  Final  01/30/2007 02:05 AM 1.52 (H)  Final  01/29/2007 07:19 AM 1.77 (H)  Final    PMH:   Past Medical History:  Diagnosis Date  . AAA (abdominal aortic aneurysm) (HCC)    3.6 cm (01/09/12 ultrasound)  . Allergy   . Arthritis   . Bile duct leak 09/2017   post op  . Carotid artery disease ([place])   . Cataract   . Chronic kidney disease    CKD, right renal artery stenosis  . Coronary artery disease    mild left internal carotid artery stenosis  . Glaucoma   . Gout   . Heart  murmur   . Hyperlipidemia   . Hypertension   . Tubular adenoma of colon 2003   Dr. Watt Climes  . Vitamin D deficiency     PSH:   Past Surgical History:  Procedure Laterality Date  . ABDOMINAL AORTOGRAM N/A 12/17/2019   Procedure: ABDOMINAL AORTOGRAM;  Surgeon: [name], MD;  Location: [place] [facility];  Service: Cardiovascular;  Laterality: N/A;  . BACK SURGERY    . CARDIAC CATHETERIZATION     stents  last -07  . CARDIAC STENTS    . CERVICAL DISC SURGERY    . CHOLECYSTECTOMY N/A 09/26/2017   Procedure: LAPAROSCOPIC CHOLECYSTECTOMY;  Surgeon: Rolm Bookbinder, MD;  Location: [place];  Service: General;  Laterality: N/A;  . ERCP N/A 10/09/2017   Procedure: ENDOSCOPIC RETROGRADE CHOLANGIOPANCREATOGRAPHY (ERCP);  Surgeon: Milus Banister, MD;  Location: [facility] ENDOSCOPY;  Service: Endoscopy;  Laterality: N/A;  . EYE SURGERY Left 06   retinal detachment  . HERNIA REPAIR Right 93  . IR RADIOLOGIST EVAL & MGMT  10/29/2017  . LUMBAR LAMINECTOMY  06/06/2012   Procedure: MICRODISCECTOMY LUMBAR LAMINECTOMY;  Surgeon: Marybelle Killings, MD;  Location: [place];  Service: Orthopedics;  Laterality: Left;  Left L4-5 Microdiscectomy  . RIGHT/LEFT HEART CATH AND CORONARY ANGIOGRAPHY N/A 12/17/2019   Procedure: RIGHT/LEFT HEART CATH AND CORONARY ANGIOGRAPHY;  Surgeon: [name], MD;  Location: [place] [facility];  Service: Cardiovascular;  Laterality: N/A;  . SHOULDER ARTHROSCOPY Right   . TOTAL HIP ARTHROPLASTY Left     Allergies:  Allergies  Allergen Reactions  . Toprol Xl [Metoprolol Tartrate] Other (See Comments)    Bradycardia with beta blockers  . Lipitor [Atorvastatin]     Myalgias   . Coumadin [Warfarin Sodium] Rash  . Tape Rash    Clear tape  . Warfarin Rash    Medications:   Prior to Admission medications   Medication Sig Start Date End Date Taking? Authorizing Provider  allopurinol (ZYLOPRIM) 300 MG tablet TAKE 1 TABLET BY MOUTH ONCE DAILY Patient taking differently: Take 300  mg by mouth daily.  03/05/18  Yes Vicie Mutters, PA-C  amLODipine (NORVASC) 10 MG tablet Take 1 tablet (10 mg total) by mouth daily. 12/18/19  Yes [name], Tami Lin, PA  aspirin EC 81 MG tablet Take 81 mg by mouth daily.   Yes [provider]  brimonidine (ALPHAGAN) 0.15 % ophthalmic solution Place 1 drop into both eyes 2 (two) times daily.   Yes [provider]  Cholecalciferol (VITAMIN D3) 5000 UNITS TABS Take 5,000 Units by mouth daily.  Yes [provider]  clopidogrel (PLAVIX) 75 MG tablet TAKE 1 TABLET BY MOUTH ONCE DAILY FOR HEART STENTS Patient taking differently: Take 75 mg by mouth daily.  06/27/17  Yes [name], MD  dorzolamide (TRUSOPT) 2 % ophthalmic solution Place 1 drop into the right eye 2 (two) times daily.    Yes [provider]  fenofibrate micronized (LOFIBRA) 134 MG capsule TAKE 1 CAPSULE BY MOUTH ONCE DAILY Patient taking differently: Take 134 mg by mouth daily before breakfast.  10/02/17  Yes [name], MD  ferrous sulfate 325 (65 FE) MG tablet Take 325 mg by mouth daily with breakfast.   Yes [provider]  furosemide (LASIX) 40 MG tablet Take 40 mg by mouth daily.  08/29/17  Yes [provider]  hydrALAZINE (APRESOLINE) 50 MG tablet Take 1 tablet (50 mg total) by mouth 3 (three) times daily. Start 12/19/19. 12/18/19 03/17/20 Yes [name], Tami Lin, PA  isosorbide mononitrate (IMDUR) 30 MG 24 hr tablet Take 0.5 tablets (15 mg total) by mouth daily. 11/13/19  Yes [name], MD  ketorolac (ACULAR) 0.5 % ophthalmic solution Place 1 drop into the right eye daily as needed (itching eye).  04/27/19  Yes [provider]  nepafenac (ILEVRO) 0.3 % ophthalmic suspension Place 1 drop into the right eye at bedtime.    Yes [provider]  pravastatin (PRAVACHOL) 40 MG tablet TAKE 1 TABLET BY MOUTH AT BEDTIME FOR CHOLESTEROL Patient taking differently: Take 40 mg by mouth daily.  03/05/18  Yes  Vicie Mutters, PA-C  triamcinolone cream (KENALOG) 0.1 % Apply 1 application topically daily as needed (rash on [place]).  07/27/19  Yes [provider]  trimethoprim-polymyxin b (POLYTRIM) ophthalmic solution Place 1 drop into the right eye See admin instructions. Monthly after injection in right eye four times a day 09/17/19  Yes [provider]    Inpatient medications: . allopurinol  300 mg Oral Daily  . amLODipine  10 mg Oral Daily  . aspirin EC  81 mg Oral Daily  . cholecalciferol  5,000 Units Oral Daily  . clopidogrel  75 mg Oral Daily  . fenofibrate  160 mg Oral Daily  . ferrous sulfate  325 mg Oral Q breakfast  . furosemide  40 mg Oral Daily  . hydrALAZINE  25 mg Oral TID  . isosorbide mononitrate  30 mg Oral BID  . pravastatin  40 mg Oral Daily    Discontinued Meds:   Medications Discontinued During This Encounter  Medication Reason  . Vitamin D3 TABS 5,000 Units   . isosorbide mononitrate (IMDUR) 24 hr tablet 15 mg   . hydrALAZINE (APRESOLINE) tablet 50 mg     Social History:  reports that he has never smoked. He has never used smokeless tobacco. He reports that he does not drink alcohol and does not use drugs.  Family History:   Family History  Problem Relation Age of Onset  . Other Brother        laryngeal cancer  . Heart disease Brother   . Heart disease Mother   . Heart disease Father   . Stroke Father   . Heart disease Sister   . Diabetes Brother   . Colon cancer Neg Hx   . Esophageal cancer Neg Hx   . Stomach cancer Neg Hx   . Rectal cancer Neg Hx     Pertinent items are noted in HPI. Weight change:   Intake/Output Summary (Last 24 hours) at 12/29/2019 1520  Last data filed at 12/29/2019 1503 Gross per 24 hour  Intake 1070.22 ml  Output --  Net 1070.22 ml   BP (!) 145/59 (BP Location: Left Arm)   Pulse 68   Temp 98.1 F (36.7 C) (Oral)   Resp 18   Ht 6\' 2"  (1.88 m)   Wt 72.8 kg Comment: scale b  SpO2 99%   BMI 20.59 kg/m   Vitals:   12/28/19 2242 12/29/19 0437 12/29/19 0839 12/29/19 1214  BP: (!) 140/57 (!) 118/56 (!) 129/50 (!) 145/59  Pulse: 78 63 68 68  Resp: 18 18 16 18   Temp: 98.5 F (36.9 C) 97.8 F (36.6 C) 97.7 F (36.5 C) 98.1 F (36.7 C)  TempSrc: Oral Oral Oral Oral  SpO2: 94% 96% 95% 99%  Weight: 73.3 kg 72.8 kg    Height: 6\' 2"  (1.88 m)        General appearance: alert, cooperative and no distress Head: Normocephalic, without obvious abnormality, atraumatic Resp: clear to auscultation bilaterally Cardio: regular rate and rhythm, no rub and SEM III/VI around precordium GI: soft, non-tender; bowel sounds normal; no masses,  no organomegaly Extremities: edema 1+ BLE  Labs: Basic Metabolic Panel: Recent Labs  Lab 12/24/19 1314 12/28/19 1410 12/28/19 1457  NA 142 138  --   K 4.4 3.7  --   CL 105 106  --   CO2 24 22  --   GLUCOSE 92 150*  --   BUN 47* 58*  --   CREATININE 2.89* 3.15*  --   ALBUMIN  --   --  2.9*  CALCIUM 8.7 8.2*  --    Liver Function Tests: Recent Labs  Lab 12/24/19 1314 12/28/19 1457  AST  --  45*  ALT  --  18  ALKPHOS  --  41  BILITOT  --  0.8  PROT 5.8* 5.1*  ALBUMIN  --  2.9*   Recent Labs  Lab 12/28/19 1457  LIPASE 41   No results for input(s): AMMONIA in the last 168 hours. CBC: Recent Labs  Lab 12/28/19 1410  WBC 8.6  HGB 8.2*  HCT 26.7*  MCV 97.8  PLT 147*   PT/INR: @LABRCNTIP (inr:5) Cardiac Enzymes: )No results for input(s): CKTOTAL, CKMB, CKMBINDEX, TROPONINI in the last 168 hours. CBG: No results for input(s): GLUCAP in the last 168 hours.  Iron Studies: No results for input(s): IRON, TIBC, TRANSFERRIN, FERRITIN in the last 168 hours.  Xrays/Other Studies: DG Chest 2 View  Result Date: 12/28/2019 CLINICAL DATA:  Chest pain.  Eight a 10 pain constant nonradiating EXAM: CHEST - 2 VIEW COMPARISON:  09/25/2017 FINDINGS: Small bilateral pleural effusions are smaller than on the study of 09/25/2017. Obscured LEFT  hemidiaphragm. Cardiomediastinal contours are stable.  Heart size mildly enlarged. On limited assessment visualized skeletal structures are unremarkable. IMPRESSION: Small bilateral pleural effusions are smaller than on 09/25/2017. There is increased opacity at the LEFT lung base perhaps in the lingula that may reflect atelectasis or developing infection. Electronically Signed   By: Zetta Bills M.D.   On: 12/28/2019 15:02     Assessment/Plan: 1.  Severe Aortic stenosis- being worked up for TAVR and will require CT scan with contrast to assess the aortic root and thoracoabdominal aorta.  He has had several contrasted studies in the past 2 weeks and his renal function has remained relatively stable so hopefully he will tolerate the CT scan, however I did discuss his advanced CKD stage IV with he and his wife and  the possible need for dialysis following TAVR surgery.  He understands and is willing to pursue RRT if needed. 2. CAD with unstable angina- has 80% second diagonal stenosis.  Currently CP free on IV ntg.  Plan per cardiology 3. 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  Scr has ranged 2.6-3.4 since 2015.  Still within his baseline.  Will need further education regarding HD and vascular access placement in preparation.  Will consult [facility] for vein mapping.  Currently stable and no indication for HD.  Continue to follow UOP and Scr.  4. Anemia of CKD- will check iron stores and likely start ESA 5. CHF- presumably due to severe AS.  For amyloid workup per Cardiology.   Terry Garner A Benita Boonstra 12/29/2019, 3:20 PM

## 2019-12-29 NOTE — Progress Notes (Signed)
Progress Note  Patient Name: Terry Garner Date of Encounter: 12/29/2019  [facility] Cardiologist: Quay Burow, MD   Subjective   Had recurrent chest discomfort at rest after attempts to wean down NTG. Now angina free on IV NTG at 15 mic. Normal hemodynamics. Creat marginally worse at 3.15.  Inpatient Medications    Scheduled Meds: . allopurinol  300 mg Oral Daily  . amLODipine  10 mg Oral Daily  . aspirin EC  81 mg Oral Daily  . cholecalciferol  5,000 Units Oral Daily  . clopidogrel  75 mg Oral Daily  . fenofibrate  160 mg Oral Daily  . ferrous sulfate  325 mg Oral Q breakfast  . furosemide  40 mg Oral Daily  . hydrALAZINE  50 mg Oral TID  . isosorbide mononitrate  15 mg Oral BID  . pravastatin  40 mg Oral Daily   Continuous Infusions: . sodium chloride 50 mL/hr at 12/29/19 0600  . nitroGLYCERIN 15 mcg/min (12/29/19 0600)   PRN Meds: acetaminophen, fentaNYL (SUBLIMAZE) injection, nitroGLYCERIN, ondansetron (ZOFRAN) IV, triamcinolone cream   Vital Signs    Vitals:   12/28/19 2200 12/28/19 2215 12/28/19 2242 12/29/19 0437  BP: (!) 147/55 (!) 146/54 (!) 140/57 (!) 118/56  Pulse: 70 70 78 63  Resp: 16 (!) 27 18 18   Temp:   98.5 F (36.9 C) 97.8 F (36.6 C)  TempSrc:   Oral Oral  SpO2: 97% 97% 94% 96%  Weight:   73.3 kg 72.8 kg  Height:   6\' 2"  (1.88 m)     Intake/Output Summary (Last 24 hours) at 12/29/2019 0836 Last data filed at 12/29/2019 0825 Gross per 24 hour  Intake 670 ml  Output --  Net 670 ml   Last 3 Weights 12/29/2019 12/28/2019 12/28/2019  Weight (lbs) 160 lb 6.4 oz 161 lb 9.6 oz 160 lb  Weight (kg) 72.757 kg 73.301 kg 72.576 kg      Telemetry    NSR - Personally Reviewed  ECG    NSR, RBBB + (almost)LAFB, no new ischemic changes - Personally Reviewed  Physical Exam  Very HOH GEN: No acute distress.   Neck: No JVD Cardiac: RRR, 3/6 mid peaking Ao ej murmur across entire precordium, no diastolic murmurs, rubs, or gallops. Loud B  carotid bruits Respiratory: Clear to auscultation bilaterally. GI: Soft, nontender, non-distended  MS: No edema; No deformity. Neuro:  very poor vision (galucoma), HOH Psych: Normal affect   Labs    High Sensitivity Troponin:   Recent Labs  Lab 12/28/19 1410 12/28/19 1648  TROPONINIHS 50* 48*      Chemistry Recent Labs  Lab 12/24/19 1314 12/28/19 1410 12/28/19 1457  NA 142 138  --   K 4.4 3.7  --   CL 105 106  --   CO2 24 22  --   GLUCOSE 92 150*  --   BUN 47* 58*  --   CREATININE 2.89* 3.15*  --   CALCIUM 8.7 8.2*  --   PROT 5.8*  --  5.1*  ALBUMIN  --   --  2.9*  AST  --   --  45*  ALT  --   --  18  ALKPHOS  --   --  41  BILITOT  --   --  0.8  GFRNONAA 19* 18*  --   GFRAA 23* 20*  --   ANIONGAP  --  10  --      Hematology Recent Labs  Lab 12/28/19  1410  WBC 8.6  RBC 2.73*  HGB 8.2*  HCT 26.7*  MCV 97.8  MCH 30.0  MCHC 30.7  RDW 15.9*  PLT 147*    BNP Recent Labs  Lab 12/28/19 1419  BNP 1,117.2*     DDimer No results for input(s): DDIMER in the last 168 hours.   Radiology    DG Chest 2 View  Result Date: 12/28/2019 CLINICAL DATA:  Chest pain.  Eight a 10 pain constant nonradiating EXAM: CHEST - 2 VIEW COMPARISON:  09/25/2017 FINDINGS: Small bilateral pleural effusions are smaller than on the study of 09/25/2017. Obscured LEFT hemidiaphragm. Cardiomediastinal contours are stable.  Heart size mildly enlarged. On limited assessment visualized skeletal structures are unremarkable. IMPRESSION: Small bilateral pleural effusions are smaller than on 09/25/2017. There is increased opacity at the LEFT lung base perhaps in the lingula that may reflect atelectasis or developing infection. Electronically Signed   By: Zetta Bills M.D.   On: 12/28/2019 15:02    Cardiac Studies   Cardiac Cath 12/17/2019: Conclusion    Previously placed Prox LAD to Mid LAD stent (unknown type) is widely patent.  2nd Diag-1 lesion is 80% stenosed.  Previously  placed 2nd Diag-2 stent (unknown type) is widely patent.  Hemodynamic findings consistent with aortic valve stenosis.  Ost LM lesion is 40% stenosed.  Dist LAD lesion is 60% stenosed.  Echo 12/09/2019: IMPRESSIONS    1. There is severe aortic stenosis, and at least moderate aortic  regurgitation. Gradients across AoV partially related to regurgitation  (LVOT VTI 27 cm). V max 4.2 m/s, MG 41 mmHG, AVA 0.95 cm2, DI 0.25. The  aortic valve is tricuspid. Aortic valve  regurgitation is moderate. Severe aortic valve stenosis. Aortic valve  area, by VTI measures 0.95 cm. Aortic valve mean gradient measures 41.0  mmHg. Aortic valve Vmax measures 4.23 m/s.  2. Left ventricular ejection fraction, by estimation, is 60 to 65%. The  left ventricle has normal function. The left ventricle has no regional  wall motion abnormalities. There is mild concentric left ventricular  hypertrophy. Left ventricular diastolic  parameters are consistent with Grade I diastolic dysfunction (impaired  relaxation).  3. Right ventricular systolic function is normal. The right ventricular  size is normal. There is mildly elevated pulmonary artery systolic  pressure. The estimated right ventricular systolic pressure is 56.2 mmHg.  4. Left atrial size was severely dilated.  5. Right atrial size was mildly dilated.  6. Moderate pericardial effusion. The pericardial effusion is  circumferential. There is no evidence of cardiac tamponade.  7. The mitral valve is degenerative. Mild mitral valve regurgitation. No  evidence of mitral stenosis.  8. The inferior vena cava is normal in size with <50% respiratory  variability, suggesting right atrial pressure of 8 mmHg.   Patient Profile     79 y.o. male with severe AS, CAD with remote PCI of the LAD and diagonal branches, hypertension, mixed hyperlipidemia, stage IV chronic kidney disease, anemia, moderate carotid atherosclerosis, and abdominal aortic aneurysm (4.5  cm), right renal artery stenosis, preserved LVEF, admitted with angina  Assessment & Plan    1.  CAD: based on very recent cath, only significant lesion was an 80% second diagonal artery stenosis. Recent nuclear study w fixed inferior defect, no ischemia. Minimal increase in troponin, flat pattern. Last night, chest pain recurred when attempting to wean the IV NTG.  Will increase oral nitrates and decrease the hydralazine. On amlodipine, but not on beta blocker. 2. AS: severe. Undergoing  TAVR workup. Had abd aortogram w cath that showed widely patent iliac arteries and known AAA. Probably still needs CT w contrast to assess the aortic root and the thoracoabdominal aorta. 3. CHF: hemodynamically compensated clinically and at recent cath (mean PAWP 10). He has an order for amyloid [id number] SPECT which we can do as an inpatient if necessary. Clinical suspicion for cardiac amyloidosis is not very high. 4. CKD 4: will ask Nephrology to see before the CTA. Likely cause of normocytic anemia.     For questions or updates, please contact [place] Please consult [email or web address] for contact info under        Signed, Sanda Klein, MD  12/29/2019, 8:36 AM

## 2019-12-29 NOTE — Progress Notes (Signed)
PROGRESS NOTE    Terry Garner  ZOX:[id number] DOB: 26-Aug-1940 DOA: 12/28/2019 PCP: Carlena Hurl, PA-C    Brief Narrative:  79 y.o. male with medical history significant of CAD with remote PCI of the LAD and diagonal branches, HTN, mixed hyperlipidemia, anemia (Hgb 8-9)stage IV CKD, moderate carotid atherosclerosis,renal artery stenosis, RBBB,and abdominal aortic aneurysm, and recently discovered severe aortic stenosis who is being evaluated for chest pain.This morning at 11AM he had sudden onset chest pain while at rest. It was 10/10 and non-radiating and no associated symptoms.Denies recent fever, chills. He has chronic LLE which is unchanged. Denies weight gain. No worsening orthopnea or sob on exertion. He came to the ED when it persisted   ED Course: In the ED BP 137/55, pulse 64, afebrile, RR 19, 98% O2. Labs showed creatinine 3.15, BUN 58, calcium 8.2, Hgb 8.2. Albumin 2.9. AST 45, HS troponin 50>48. CXR showed small B/L pleural effusions, however small than 09/25/2017, increased opacity in the left lung base, atelectasis vs infection.EKG nonacute. In the ED he was given IV morphine and IV nitro  Assessment & Plan:   Active Problems:   Hyperlipidemia   Uncontrolled hypertension   CKD (chronic kidney disease) stage 4, GFR 15-29 ml/min (HCC)   Coronary artery disease of native artery of native heart with stable angina pectoris (HCC)   Chest pain  1. Chest pain  -known significant cardiac hx including CAD s/p PCI, moderate carotid atherosclerosis, severe AS -Cardiology is following. Pt had been continued on nitro gtt overnight which resolved chest pains -Cardiology rec to increase oral nitrates, decrease hydralazine, and wean nitro gtt to off as tolerated  2. HTN -BP currently stable -Weaning nitro gtt, increased oral nitrates while decreasing hydralazine per above  3. Renal - CKD stage4 -Chart reviewed. Baseline Cr seems to be between 2-3 -Cr currently 3.15 -on  gentle hydration at time of presentation -Nephrology was consulted as pt is planned for contrast CT for further work up of below AS -Repeat bmet in AM, f/u on Nephrology recs  4. HLD- continue home meds -Lipid panel reviewed -LDL of 46 with HDL of 20  5. AS - known problem.  -Considering TVAR but renal impairment had prevented staging studies. -Per Cardiology, consideration for contrast CT to assess the aortic root and thoracoabdominal aorta -nephrology was consulted before committing to contrast study  5. Palliative care - Dr. Linda Hedges had long conversation with patient and his wife about palliation, end of life care and code status. Pt had reportedly stated that if he has cardiac arrest he would not want to be brought back but his wife isn't ready to change code status. He had verbalized not wanting HD.  -Will consult Palliative Care  DVT prophylaxis: Will order SCD's Code Status: Full Family Communication: Pt in room, family not at bedside  Status is: Observation  The patient will require care spanning > 2 midnights and should be moved to inpatient because: Ongoing diagnostic testing needed not appropriate for outpatient work up, IV treatments appropriate due to intensity of illness or inability to take PO and Inpatient level of care appropriate due to severity of illness  Dispo: The patient is from: Home              Anticipated d/c is to: Home              Anticipated d/c date is: 2 days              Patient currently is  not medically stable to d/c.  Consultants:   Cardiology  Nephrology  Palliative Care  Procedures:     Antimicrobials: Anti-infectives (From admission, onward)   None       Subjective: Reports feeling much better while on nitro gtt  Objective: Vitals:   12/28/19 2242 12/29/19 0437 12/29/19 0839 12/29/19 1214  BP: (!) 140/57 (!) 118/56 (!) 129/50 (!) 145/59  Pulse: 78 63 68 68  Resp: 18 18 16 18   Temp: 98.5 F (36.9 C) 97.8 F (36.6 C)  97.7 F (36.5 C) 98.1 F (36.7 C)  TempSrc: Oral Oral Oral Oral  SpO2: 94% 96% 95% 99%  Weight: 73.3 kg 72.8 kg    Height: 6\' 2"  (1.88 m)       Intake/Output Summary (Last 24 hours) at 12/29/2019 1512 Last data filed at 12/29/2019 1503 Gross per 24 hour  Intake 1070.22 ml  Output --  Net 1070.22 ml   Filed Weights   12/28/19 1356 12/28/19 2242 12/29/19 0437  Weight: 72.6 kg 73.3 kg 72.8 kg    Examination:  General exam: Appears calm and comfortable  Respiratory system: Clear to auscultation. Respiratory effort normal. Cardiovascular system: S1 & S2 heard, Regular Gastrointestinal system: Abdomen is nondistended, soft and nontender. No organomegaly or masses felt. Normal bowel sounds heard. Central nervous system: Alert and oriented. No focal neurological deficits. Extremities: Symmetric 5 x 5 power. Skin: No rashes, lesions Psychiatry: Judgement and insight appear normal. Mood & affect appropriate.   Data Reviewed: I have personally reviewed following labs and imaging studies  CBC: Recent Labs  Lab 12/28/19 1410  WBC 8.6  HGB 8.2*  HCT 26.7*  MCV 97.8  PLT 332*   Basic Metabolic Panel: Recent Labs  Lab 12/24/19 1314 12/28/19 1410  NA 142 138  K 4.4 3.7  CL 105 106  CO2 24 22  GLUCOSE 92 150*  BUN 47* 58*  CREATININE 2.89* 3.15*  CALCIUM 8.7 8.2*   GFR: Estimated Creatinine Clearance: 18.9 mL/min (A) (by C-G formula based on SCr of 3.15 mg/dL (H)). Liver Function Tests: Recent Labs  Lab 12/24/19 1314 12/28/19 1457  AST  --  45*  ALT  --  18  ALKPHOS  --  41  BILITOT  --  0.8  PROT 5.8* 5.1*  ALBUMIN  --  2.9*   Recent Labs  Lab 12/28/19 1457  LIPASE 41   No results for input(s): AMMONIA in the last 168 hours. Coagulation Profile: No results for input(s): INR, PROTIME in the last 168 hours. Cardiac Enzymes: No results for input(s): CKTOTAL, CKMB, CKMBINDEX, TROPONINI in the last 168 hours. BNP (last 3 results) No results for input(s):  PROBNP in the last 8760 hours. HbA1C: No results for input(s): HGBA1C in the last 72 hours. CBG: No results for input(s): GLUCAP in the last 168 hours. Lipid Profile: Recent Labs    12/29/19 0412  CHOL 81  HDL 20*  LDLCALC 46  TRIG 75  CHOLHDL 4.1   Thyroid Function Tests: No results for input(s): TSH, T4TOTAL, FREET4, T3FREE, THYROIDAB in the last 72 hours. Anemia Panel: No results for input(s): VITAMINB12, FOLATE, FERRITIN, TIBC, IRON, RETICCTPCT in the last 72 hours. Sepsis Labs: No results for input(s): PROCALCITON, LATICACIDVEN in the last 168 hours.  Recent Results (from the past 240 hour(s))  SARS Coronavirus 2 by RT PCR (hospital order, performed in [facility] hospital lab) Nasopharyngeal Nasopharyngeal Swab     Status: None   Collection Time: 12/28/19  7:09 PM  Specimen: Nasopharyngeal Swab  Result Value Ref Range Status   SARS Coronavirus 2 NEGATIVE NEGATIVE Final    Comment: (NOTE) SARS-CoV-2 target nucleic acids are NOT DETECTED.  The SARS-CoV-2 RNA is generally detectable in upper and lower respiratory specimens during the acute phase of infection. The lowest concentration of SARS-CoV-2 viral copies this assay can detect is 250 copies / mL. A negative result does not preclude SARS-CoV-2 infection and should not be used as the sole basis for treatment or other patient management decisions.  A negative result may occur with improper specimen collection / handling, submission of specimen other than nasopharyngeal swab, presence of viral mutation(s) within the areas targeted by this assay, and inadequate number of viral copies (<250 copies / mL). A negative result must be combined with clinical observations, patient history, and epidemiological information.  Fact Sheet for Patients:   StrictlyIdeas.no  Fact Sheet for Healthcare Providers: BankingDealers.co.za  This test is not yet approved or  cleared by the  [place] FDA and has been authorized for detection and/or diagnosis of SARS-CoV-2 by FDA under an Emergency Use Authorization (EUA).  This EUA will remain in effect (meaning this test can be used) for the duration of the COVID-19 declaration under Section 564(b)(1) of the Act, 21 U.S.C. section 360bbb-3(b)(1), unless the authorization is terminated or revoked sooner.  Performed at [facility], [place] [address] [place], [place] [address]      Radiology Studies: DG Chest 2 View  Result Date: 12/28/2019 CLINICAL DATA:  Chest pain.  Eight a 10 pain constant nonradiating EXAM: CHEST - 2 VIEW COMPARISON:  09/25/2017 FINDINGS: Small bilateral pleural effusions are smaller than on the study of 09/25/2017. Obscured LEFT hemidiaphragm. Cardiomediastinal contours are stable.  Heart size mildly enlarged. On limited assessment visualized skeletal structures are unremarkable. IMPRESSION: Small bilateral pleural effusions are smaller than on 09/25/2017. There is increased opacity at the LEFT lung base perhaps in the lingula that may reflect atelectasis or developing infection. Electronically Signed   By: Zetta Bills M.D.   On: 12/28/2019 15:02    Scheduled Meds: . allopurinol  300 mg Oral Daily  . amLODipine  10 mg Oral Daily  . aspirin EC  81 mg Oral Daily  . cholecalciferol  5,000 Units Oral Daily  . clopidogrel  75 mg Oral Daily  . fenofibrate  160 mg Oral Daily  . ferrous sulfate  325 mg Oral Q breakfast  . furosemide  40 mg Oral Daily  . hydrALAZINE  25 mg Oral TID  . isosorbide mononitrate  30 mg Oral BID  . pravastatin  40 mg Oral Daily   Continuous Infusions: . nitroGLYCERIN 15 mcg/min (12/29/19 0600)     LOS: 0 days   Marylu Lund, MD [facility] Pager On [place]  If 7PM-7AM, please contact night-coverage 12/29/2019, 3:12 PM

## 2019-12-30 ENCOUNTER — Other Ambulatory Visit (HOSPITAL_COMMUNITY): Payer: Medicare Other

## 2019-12-30 ENCOUNTER — Inpatient Hospital Stay (HOSPITAL_COMMUNITY): Payer: Medicare Other

## 2019-12-30 DIAGNOSIS — I351 Nonrheumatic aortic (valve) insufficiency: Secondary | ICD-10-CM

## 2019-12-30 DIAGNOSIS — N186 End stage renal disease: Secondary | ICD-10-CM

## 2019-12-30 DIAGNOSIS — I714 Abdominal aortic aneurysm, without rupture: Secondary | ICD-10-CM

## 2019-12-30 DIAGNOSIS — N184 Chronic kidney disease, stage 4 (severe): Secondary | ICD-10-CM

## 2019-12-30 DIAGNOSIS — Z954 Presence of other heart-valve replacement: Secondary | ICD-10-CM

## 2019-12-30 DIAGNOSIS — I350 Nonrheumatic aortic (valve) stenosis: Secondary | ICD-10-CM

## 2019-12-30 LAB — RENAL FUNCTION PANEL
Albumin: 2.5 g/dL — ABNORMAL LOW (ref 3.5–5.0)
Anion gap: 10 (ref 5–15)
BUN: 62 mg/dL — ABNORMAL HIGH (ref 8–23)
CO2: 23 mmol/L (ref 22–32)
Calcium: 8.4 mg/dL — ABNORMAL LOW (ref 8.9–10.3)
Chloride: 104 mmol/L (ref 98–111)
Creatinine, Ser: 2.99 mg/dL — ABNORMAL HIGH (ref 0.61–1.24)
GFR calc Af Amer: 22 mL/min — ABNORMAL LOW (ref >60)
GFR calc non Af Amer: 19 mL/min — ABNORMAL LOW (ref >60)
Glucose, Bld: 96 mg/dL (ref 70–99)
Phosphorus: 3.9 mg/dL (ref 2.5–4.6)
Potassium: 3.8 mmol/L (ref 3.5–5.1)
Sodium: 137 mmol/L (ref 135–145)

## 2019-12-30 LAB — IRON AND TIBC
Iron: 25 ug/dL — ABNORMAL LOW (ref 45–182)
Saturation Ratios: 9 % — ABNORMAL LOW (ref 17.9–39.5)
TIBC: 291 ug/dL (ref 250–450)
UIBC: 266 ug/dL

## 2019-12-30 LAB — CBC
HCT: 25 % — ABNORMAL LOW (ref 39.0–52.0)
Hemoglobin: 8 g/dL — ABNORMAL LOW (ref 13.0–17.0)
MCH: 31 pg (ref 26.0–34.0)
MCHC: 32 g/dL (ref 30.0–36.0)
MCV: 96.9 fL (ref 80.0–100.0)
Platelets: 144 10*3/uL — ABNORMAL LOW (ref 150–400)
RBC: 2.58 MIL/uL — ABNORMAL LOW (ref 4.22–5.81)
RDW: 15.6 % — ABNORMAL HIGH (ref 11.5–15.5)
WBC: 6.8 10*3/uL (ref 4.0–10.5)
nRBC: 0 % (ref 0.0–0.2)

## 2019-12-30 LAB — FERRITIN: Ferritin: 289 ng/mL (ref 24–336)

## 2019-12-30 MED ORDER — METOPROLOL TARTRATE 5 MG/5ML IV SOLN
INTRAVENOUS | Status: AC
  Administered 2019-12-30: 10 mg via INTRAVENOUS
  Filled 2019-12-30: qty 10

## 2019-12-30 MED ORDER — IOHEXOL 350 MG/ML SOLN
100.0000 mL | Freq: Once | INTRAVENOUS | Status: AC | PRN
  Administered 2019-12-30: 100 mL via INTRAVENOUS

## 2019-12-30 MED ORDER — HEPARIN BOLUS VIA INFUSION
2000.0000 [IU] | Freq: Once | INTRAVENOUS | Status: AC
  Administered 2019-12-30: 2000 [IU] via INTRAVENOUS
  Filled 2019-12-30: qty 2000

## 2019-12-30 MED ORDER — DORZOLAMIDE HCL 2 % OP SOLN
1.0000 [drp] | Freq: Two times a day (BID) | OPHTHALMIC | Status: DC
  Administered 2019-12-30: 1 [drp] via OPHTHALMIC
  Filled 2019-12-30: qty 10

## 2019-12-30 MED ORDER — DARBEPOETIN ALFA 60 MCG/0.3ML IJ SOSY
60.0000 ug | PREFILLED_SYRINGE | INTRAMUSCULAR | Status: DC
  Administered 2019-12-30: 60 ug via SUBCUTANEOUS
  Filled 2019-12-30: qty 0.3

## 2019-12-30 MED ORDER — METOPROLOL TARTRATE 5 MG/5ML IV SOLN: 10.0000 mg | Freq: Once | INTRAVENOUS | Status: AC

## 2019-12-30 MED ORDER — METOPROLOL TARTRATE 25 MG PO TABS
25.0000 mg | ORAL_TABLET | Freq: Two times a day (BID) | ORAL | Status: DC
  Administered 2019-12-30 – 2020-01-01 (×5): 25 mg via ORAL
  Filled 2019-12-30 (×5): qty 1

## 2019-12-30 MED ORDER — NEPAFENAC 0.3 % OP SUSP
1.0000 [drp] | Freq: Every day | OPHTHALMIC | Status: DC
  Administered 2019-12-30 – 2019-12-31 (×2): 1 [drp] via OPHTHALMIC

## 2019-12-30 MED ORDER — CEFAZOLIN SODIUM-DEXTROSE 1-4 GM/50ML-% IV SOLN
1.0000 g | INTRAVENOUS | Status: AC
  Administered 2019-12-31: 1 g via INTRAVENOUS
  Filled 2019-12-30 (×2): qty 50

## 2019-12-30 MED ORDER — HEPARIN (PORCINE) 25000 UT/250ML-% IV SOLN
1100.0000 [IU]/h | INTRAVENOUS | Status: DC
  Administered 2019-12-30: 900 [IU]/h via INTRAVENOUS
  Filled 2019-12-30: qty 250

## 2019-12-30 MED ORDER — SODIUM CHLORIDE 0.9 % IV SOLN
510.0000 mg | INTRAVENOUS | Status: DC
  Administered 2019-12-30: 510 mg via INTRAVENOUS
  Filled 2019-12-30: qty 17

## 2019-12-30 MED ORDER — BRIMONIDINE TARTRATE 0.15 % OP SOLN
1.0000 [drp] | Freq: Two times a day (BID) | OPHTHALMIC | Status: DC
  Administered 2019-12-30 – 2020-01-01 (×3): 1 [drp] via OPHTHALMIC
  Filled 2019-12-30: qty 5

## 2019-12-30 MED ORDER — SODIUM CHLORIDE 0.9 % IV SOLN: INTRAVENOUS | Status: DC

## 2019-12-30 NOTE — Progress Notes (Signed)
ANTICOAGULATION CONSULT NOTE - Initial Consult  Pharmacy Consult for Heparin Indication: atrial fibrillation  Allergies  Allergen Reactions  . Toprol Xl [Metoprolol Tartrate] Other (See Comments)    Bradycardia with beta blockers  . Lipitor [Atorvastatin]     Myalgias   . Coumadin [Warfarin Sodium] Rash  . Tape Rash    Clear tape  . Warfarin Rash    Patient Measurements: Height: 6\' 2"  (188 cm) Weight: 73.2 kg (161 lb 6.4 oz) (scale b) IBW/kg (Calculated) : 82.2 Heparin Dosing Weight: 73kg  Vital Signs: Temp: 97.5 F (36.4 C) (07/07 1139) Temp Source: Oral (07/07 1139) BP: 120/58 (07/07 1139) Pulse Rate: 66 (07/07 1139)  Labs: Recent Labs    12/28/19 1410 12/28/19 1648 12/30/19 0818  HGB 8.2*  --  8.0*  HCT 26.7*  --  25.0*  PLT 147*  --  144*  CREATININE 3.15*  --  2.99*  TROPONINIHS 50* 48*  --     Estimated Creatinine Clearance: 20.1 mL/min (A) (by C-G formula based on SCr of 2.99 mg/dL (H)).  Assessment: 79 yo M starting on heparin for new afib (CHADS2VASc = 4). No AC PTA. Noted CKD IV. Anemic at baseline.    Goal of Therapy:  Heparin level 0.3-0.7 units/ml Monitor platelets by anticoagulation protocol: Yes   Plan:  Heparin 2000 unit bolus x1 then 900 units/hr F/u 6hr HL  Monitor daily HL, CBC/plt Monitor for signs/symptoms of bleeding    Benetta Spar, PharmD, BCPS, BCCP Clinical Pharmacist  Please check AMION for all [place] phone numbers After 10:00 PM, call [place]

## 2019-12-30 NOTE — Progress Notes (Signed)
PROGRESS NOTE    Terry Garner  NKN:[id number] DOB: 07-06-1940 DOA: 12/28/2019 PCP: Carlena Hurl, PA-C    Brief Narrative:  79 y.o. male with medical history significant of CAD with remote PCI of the LAD and diagonal branches, HTN, mixed hyperlipidemia, anemia (Hgb 8-9)stage IV CKD, moderate carotid atherosclerosis,renal artery stenosis, RBBB,and abdominal aortic aneurysm, and recently discovered severe aortic stenosis who is being evaluated for chest pain.This morning at 11AM he had sudden onset chest pain while at rest. It was 10/10 and non-radiating and no associated symptoms.Denies recent fever, chills. He has chronic LLE which is unchanged. Denies weight gain. No worsening orthopnea or sob on exertion. He came to the ED when it persisted   ED Course: In the ED BP 137/55, pulse 64, afebrile, RR 19, 98% O2. Labs showed creatinine 3.15, BUN 58, calcium 8.2, Hgb 8.2. Albumin 2.9. AST 45, HS troponin 50>48. CXR showed small B/L pleural effusions, however small than 09/25/2017, increased opacity in the left lung base, atelectasis vs infection.EKG nonacute. In the ED he was given IV morphine and IV nitro  Assessment & Plan:   Active Problems:   Hyperlipidemia   Uncontrolled hypertension   CKD (chronic kidney disease) stage 4, GFR 15-29 ml/min (HCC)   Aortic stenosis   Coronary artery disease of native artery of native heart with stable angina pectoris (HCC)   Chest pain  1. Chest pain  -known significant cardiac hx including CAD s/p PCI, moderate carotid atherosclerosis, severe AS -Cardiology is following. Pt had been continued on nitro gtt which resolved chest pain -Cardiology rec to increase oral nitrates, decrease hydralazine, and wean nitro gtt to off as tolerated  2. HTN -BP currently stable -Weaning nitro gtt, increased oral nitrates while decreasing hydralazine per above  3. Renal - CKD stage4 -Chart reviewed. Baseline Cr seems to be between 2-3 -Cr currently  3.15 -on gentle hydration at time of presentation -Nephrology was consulted as pt is planned for contrast CT for further work up of below AS -Repeat bmet in AM -VVS consulted by nephrology for vein mapping in preparation of permanent HD access  4. HLD- continue home meds -Lipid panel reviewed -LDL of 46 with HDL of 20  5. AS - known problem.  -Considering TVAR but renal impairment had prevented staging studies. -Per Cardiology, consideration for contrast CT to assess the aortic root and thoracoabdominal aorta to be performed later today  6. Atrial Fibrillation. Patient went into A fib this morning. Started on beta blockers for rate control and IV heparin for stroke prevention. Plans to transition to reduced dose eliquis on discharge  7. Acquired thrombophilia. Increase stroke risk due to atrial fibrillation. On anticoagulation.  7. Palliative care - Dr. Linda Hedges had long conversation with patient and his wife about palliation, end of life care and code status. Pt had reportedly stated that if he has cardiac arrest he would not want to be brought back but his wife isn't ready to change code status. He had verbalized to nephrology that he would want HD  DVT prophylaxis: heparin infusion Code Status: Full Family Communication: Pt in room, Wife and son at the bedside  Status is: Inpatient  The patient will require care spanning > 2 midnights and should be moved to inpatient because: Ongoing diagnostic testing needed not appropriate for outpatient work up, IV treatments appropriate due to intensity of illness or inability to take PO and Inpatient level of care appropriate due to severity of illness  Dispo: The patient is  from: Home              Anticipated d/c is to: Home              Anticipated d/c date is: 1 day              Patient currently is not medically stable to d/c.  Consultants:   Cardiology  Nephrology  Palliative Care  Procedures:      Antimicrobials: Anti-infectives (From admission, onward)   None      Subjective: No recurrent chest pain. Still on nitro gtt which is being weaned off. Noted to go into A fib this morning and started on heparin infusion  Objective: Vitals:   12/29/19 2115 12/30/19 0014 12/30/19 0419 12/30/19 0815  BP: (!) 133/52 (!) 116/47 (!) 126/53 (!) 146/54  Pulse: 72 69 (!) 58 72  Resp: 16 14 18 16   Temp: 98.9 F (37.2 C) 99 F (37.2 C) 98.3 F (36.8 C) 98.5 F (36.9 C)  TempSrc: Oral Oral Oral Oral  SpO2: 96% 96% 98% 98%  Weight:   73.2 kg   Height:        Intake/Output Summary (Last 24 hours) at 12/30/2019 1131 Last data filed at 12/30/2019 0943 Gross per 24 hour  Intake 1320.22 ml  Output 1175 ml  Net 145.22 ml   Filed Weights   12/28/19 2242 12/29/19 0437 12/30/19 0419  Weight: 73.3 kg 72.8 kg 73.2 kg    Examination:  General exam: Alert, awake, oriented x 3 Respiratory system: Clear to auscultation. Respiratory effort normal. Cardiovascular system:irregular. No murmurs, rubs, gallops. Gastrointestinal system: Abdomen is nondistended, soft and nontender. No organomegaly or masses felt. Normal bowel sounds heard. Central nervous system: Alert and oriented. No focal neurological deficits. Extremities: No C/C/E, +pedal pulses Skin: No rashes, lesions or ulcers Psychiatry: Judgement and insight appear normal. Mood & affect appropriate.    Data Reviewed: I have personally reviewed following labs and imaging studies  CBC: Recent Labs  Lab 12/28/19 1410 12/30/19 0818  WBC 8.6 6.8  HGB 8.2* 8.0*  HCT 26.7* 25.0*  MCV 97.8 96.9  PLT 147* 646*   Basic Metabolic Panel: Recent Labs  Lab 12/24/19 1314 12/28/19 1410 12/30/19 0818  NA 142 138 137  K 4.4 3.7 3.8  CL 105 106 104  CO2 24 22 23   GLUCOSE 92 150* 96  BUN 47* 58* 62*  CREATININE 2.89* 3.15* 2.99*  CALCIUM 8.7 8.2* 8.4*  PHOS  --   --  3.9   GFR: Estimated Creatinine Clearance: 20.1 mL/min (A) (by  C-G formula based on SCr of 2.99 mg/dL (H)). Liver Function Tests: Recent Labs  Lab 12/24/19 1314 12/28/19 1457 12/30/19 0818  AST  --  45*  --   ALT  --  18  --   ALKPHOS  --  41  --   BILITOT  --  0.8  --   PROT 5.8* 5.1*  --   ALBUMIN  --  2.9* 2.5*   Recent Labs  Lab 12/28/19 1457  LIPASE 41   No results for input(s): AMMONIA in the last 168 hours. Coagulation Profile: No results for input(s): INR, PROTIME in the last 168 hours. Cardiac Enzymes: No results for input(s): CKTOTAL, CKMB, CKMBINDEX, TROPONINI in the last 168 hours. BNP (last 3 results) No results for input(s): PROBNP in the last 8760 hours. HbA1C: No results for input(s): HGBA1C in the last 72 hours. CBG: No results for input(s): GLUCAP in the last 168  hours. Lipid Profile: Recent Labs    12/29/19 0412  CHOL 81  HDL 20*  LDLCALC 46  TRIG 75  CHOLHDL 4.1   Thyroid Function Tests: No results for input(s): TSH, T4TOTAL, FREET4, T3FREE, THYROIDAB in the last 72 hours. Anemia Panel: Recent Labs    12/30/19 0818  FERRITIN 289  TIBC 291  IRON 25*   Sepsis Labs: No results for input(s): PROCALCITON, LATICACIDVEN in the last 168 hours.  Recent Results (from the past 240 hour(s))  SARS Coronavirus 2 by RT PCR (hospital order, performed in [facility] hospital lab) Nasopharyngeal Nasopharyngeal Swab     Status: None   Collection Time: 12/28/19  7:09 PM   Specimen: Nasopharyngeal Swab  Result Value Ref Range Status   SARS Coronavirus 2 NEGATIVE NEGATIVE Final    Comment: (NOTE) SARS-CoV-2 target nucleic acids are NOT DETECTED.  The SARS-CoV-2 RNA is generally detectable in upper and lower respiratory specimens during the acute phase of infection. The lowest concentration of SARS-CoV-2 viral copies this assay can detect is 250 copies / mL. A negative result does not preclude SARS-CoV-2 infection and should not be used as the sole basis for treatment or other patient management decisions.  A  negative result may occur with improper specimen collection / handling, submission of specimen other than nasopharyngeal swab, presence of viral mutation(s) within the areas targeted by this assay, and inadequate number of viral copies (<250 copies / mL). A negative result must be combined with clinical observations, patient history, and epidemiological information.  Fact Sheet for Patients:   StrictlyIdeas.no  Fact Sheet for Healthcare Providers: BankingDealers.co.za  This test is not yet approved or  cleared by the [place] FDA and has been authorized for detection and/or diagnosis of SARS-CoV-2 by FDA under an Emergency Use Authorization (EUA).  This EUA will remain in effect (meaning this test can be used) for the duration of the COVID-19 declaration under Section 564(b)(1) of the Act, 21 U.S.C. section 360bbb-3(b)(1), unless the authorization is terminated or revoked sooner.  Performed at [facility], [place] [address] [place], [place] [address]      Radiology Studies: DG Chest 2 View  Result Date: 12/28/2019 CLINICAL DATA:  Chest pain.  Eight a 10 pain constant nonradiating EXAM: CHEST - 2 VIEW COMPARISON:  09/25/2017 FINDINGS: Small bilateral pleural effusions are smaller than on the study of 09/25/2017. Obscured LEFT hemidiaphragm. Cardiomediastinal contours are stable.  Heart size mildly enlarged. On limited assessment visualized skeletal structures are unremarkable. IMPRESSION: Small bilateral pleural effusions are smaller than on 09/25/2017. There is increased opacity at the LEFT lung base perhaps in the lingula that may reflect atelectasis or developing infection. Electronically Signed   By: Zetta Bills M.D.   On: 12/28/2019 15:02    Scheduled Meds: . allopurinol  300 mg Oral Daily  . amLODipine  10 mg Oral Daily  . aspirin EC  81 mg Oral Daily  . brimonidine  1 drop Both Eyes BID  . cholecalciferol  5,000  Units Oral Daily  . clopidogrel  75 mg Oral Daily  . darbepoetin (ARANESP) injection - NON-DIALYSIS  60 mcg Subcutaneous Q Wed-1800  . dorzolamide  1 drop Right Eye BID  . fenofibrate  160 mg Oral Daily  . furosemide  40 mg Oral Daily  . hydrALAZINE  25 mg Oral TID  . isosorbide mononitrate  30 mg Oral BID  . metoprolol tartrate  25 mg Oral BID  . nepafenac  1 drop Right  Eye QHS  . pravastatin  40 mg Oral Daily   Continuous Infusions: . sodium chloride    . ferumoxytol    . nitroGLYCERIN 10 mcg/min (12/30/19 0842)     LOS: 1 day   Kathie Dike, MD [facility] Pager On [place]  If 7PM-7AM, please contact night-coverage 12/30/2019, 11:31 AM

## 2019-12-30 NOTE — Progress Notes (Signed)
Progress Note  Patient Name: Terry Garner Date of Encounter: 12/30/2019  [facility] Cardiologist: Quay Burow, MD   Subjective   No angina since early hors of yesterday morning, but still on IV NTG. Just went into atrial fibrillation with mild RVR (110) about 10 minutes ago, unaware of palpitations, no dyspnea. Reported history of bradycardia/"hypersensitivity" to beta blockers in the past, but he does not recall that. Wants to go home to spend time with his son, before he goes back to [place].  Inpatient Medications    Scheduled Meds:  allopurinol  300 mg Oral Daily   amLODipine  10 mg Oral Daily   aspirin EC  81 mg Oral Daily   cholecalciferol  5,000 Units Oral Daily   clopidogrel  75 mg Oral Daily   fenofibrate  160 mg Oral Daily   ferrous sulfate  325 mg Oral Q breakfast   furosemide  40 mg Oral Daily   hydrALAZINE  25 mg Oral TID   isosorbide mononitrate  30 mg Oral BID   metoprolol tartrate  25 mg Oral BID   pravastatin  40 mg Oral Daily   Continuous Infusions:  nitroGLYCERIN 10 mcg/min (12/30/19 0842)   PRN Meds: acetaminophen, fentaNYL (SUBLIMAZE) injection, nitroGLYCERIN, ondansetron (ZOFRAN) IV, triamcinolone cream   Vital Signs    Vitals:   12/29/19 2115 12/30/19 0014 12/30/19 0419 12/30/19 0815  BP: (!) 133/52 (!) 116/47 (!) 126/53 (!) 146/54  Pulse: 72 69 (!) 58 72  Resp: 16 14 18 16   Temp: 98.9 F (37.2 C) 99 F (37.2 C) 98.3 F (36.8 C) 98.5 F (36.9 C)  TempSrc: Oral Oral Oral Oral  SpO2: 96% 96% 98% 98%  Weight:   73.2 kg   Height:        Intake/Output Summary (Last 24 hours) at 12/30/2019 0856 Last data filed at 12/30/2019 0800 Gross per 24 hour  Intake 1080.22 ml  Output 1175 ml  Net -94.78 ml   Last 3 Weights 12/30/2019 12/29/2019 12/28/2019  Weight (lbs) 161 lb 6.4 oz 160 lb 6.4 oz 161 lb 9.6 oz  Weight (kg) 73.211 kg 72.757 kg 73.301 kg      Telemetry    AFib w RVR started around 0830h - Personally Reviewed  ECG     AFib w RVR, RBBB, LAD - Personally Reviewed  Physical Exam  Appears wll GEN: No acute distress.   Neck: No JVD. Loud bilateral carotid bruits Cardiac: irregular, 3/6 mid peaking AS murmur, no diastolic murmurs, rubs, or gallops.  Respiratory: Clear to auscultation bilaterally. GI: Soft, nontender, non-distended  MS: No edema; No deformity. Neuro:  HOH, poor vision Psych: Normal affect   Labs    High Sensitivity Troponin:   Recent Labs  Lab 12/28/19 1410 12/28/19 1648  TROPONINIHS 50* 48*      Chemistry Recent Labs  Lab 12/24/19 1314 12/28/19 1410 12/28/19 1457  NA 142 138  --   K 4.4 3.7  --   CL 105 106  --   CO2 24 22  --   GLUCOSE 92 150*  --   BUN 47* 58*  --   CREATININE 2.89* 3.15*  --   CALCIUM 8.7 8.2*  --   PROT 5.8*  --  5.1*  ALBUMIN  --   --  2.9*  AST  --   --  45*  ALT  --   --  18  ALKPHOS  --   --  41  BILITOT  --   --  0.8  GFRNONAA 19* 18*  --   GFRAA 23* 20*  --   ANIONGAP  --  10  --      Hematology Recent Labs  Lab 12/28/19 1410  WBC 8.6  RBC 2.73*  HGB 8.2*  HCT 26.7*  MCV 97.8  MCH 30.0  MCHC 30.7  RDW 15.9*  PLT 147*    BNP Recent Labs  Lab 12/28/19 1419  BNP 1,117.2*     DDimer No results for input(s): DDIMER in the last 168 hours.   Radiology    DG Chest 2 View  Result Date: 12/28/2019 CLINICAL DATA:  Chest pain.  Eight a 10 pain constant nonradiating EXAM: CHEST - 2 VIEW COMPARISON:  09/25/2017 FINDINGS: Small bilateral pleural effusions are smaller than on the study of 09/25/2017. Obscured LEFT hemidiaphragm. Cardiomediastinal contours are stable.  Heart size mildly enlarged. On limited assessment visualized skeletal structures are unremarkable. IMPRESSION: Small bilateral pleural effusions are smaller than on 09/25/2017. There is increased opacity at the LEFT lung base perhaps in the lingula that may reflect atelectasis or developing infection. Electronically Signed   By: Zetta Bills M.D.   On: 12/28/2019  15:02    Cardiac Studies   Cardiac Cath 12/17/2019: Conclusion    Previously placed Prox LAD to Mid LAD stent (unknown type) is widely patent.  2nd Diag-1 lesion is 80% stenosed.  Previously placed 2nd Diag-2 stent (unknown type) is widely patent.  Hemodynamic findings consistent with aortic valve stenosis.  Ost LM lesion is 40% stenosed.  Dist LAD lesion is 60% stenosed.  Echo 12/09/2019: IMPRESSIONS    1. There is severe aortic stenosis, and at least moderate aortic regurgitation. Gradients across AoV partially related to regurgitation (LVOT VTI 27 cm). V max 4.2 m/s, MG 41 mmHG, AVA 0.95 cm2, DI 0.25. The aortic valve is tricuspid. Aortic valve regurgitation is moderate. Severe aortic valve stenosis. Aortic valve area, by VTI measures 0.95 cm. Aortic valve mean gradient measures 41.0 mmHg. Aortic valve Vmax measures 4.23 m/s.  2. Left ventricular ejection fraction, by estimation, is 60 to 65%. The left ventricle has normal function. The left ventricle has no regional wall motion abnormalities. There is mild concentric left ventricular hypertrophy. Left ventricular diastolic parameters are consistent with Grade I diastolic dysfunction (impaired relaxation).  3. Right ventricular systolic function is normal. The right ventricular size is normal. There is mildly elevated pulmonary artery systolic pressure. The estimated right ventricular systolic pressure is 27.0 mmHg.  4. Left atrial size was severely dilated.  5. Right atrial size was mildly dilated.  6. Moderate pericardial effusion. The pericardial effusion is circumferential. There is no evidence of cardiac tamponade.  7. The mitral valve is degenerative. Mild mitral valve regurgitation. No evidence of mitral stenosis.  8. The inferior vena cava is normal in size with <50% respiratory variability, suggesting right atrial pressure of 8 mmHg.   Patient Profile     79 y.o. male with severe AS/moderate AI, CAD with  remote PCI of the LAD and diagonal branches, hypertension, mixed hyperlipidemia, stage IV chronic kidney disease, anemia, moderate carotid atherosclerosis, and abdominal aortic aneurysm (4.5 cm), right renal artery stenosis, preserved LVEF, admitted with angina, without evidence of ACS.  Assessment & Plan    1.  CAD: based on very recent cath, only significant lesion was an 80% second diagonal artery stenosis. Recent nuclear study w fixed inferior defect, no ischemia. Minimal increase in troponin, flat pattern. Start beta blocker. On amlodipine and nitrates, weaning off  IV NTG. 2. AS: severe. Undergoing TAVR workup. Had abd aortogram w cath that showed widely patent iliac arteries and known AAA. Still needs CT w contrast to assess the aortic root and the thoracoabdominal aorta. Will try to schedule after IV fluids. 3. CHF: hemodynamically compensated clinically and at recent cath (mean PAWP 10). He has an order for amyloid [id number] SPECT which we can do as an inpatient if necessary. Clinical suspicion for cardiac amyloidosis is not very high. 4. CKD 4: appreciate evaluation by Nephrology before the CTA. Likely cause of normocytic anemia. Labs from today still pending. 5. AFib: start beta blockers for rate control and IV heparin. Will plan Eliquis long term (2.5 mg BID, adjusted for age and renal dysfunction). While in the hospital will use IV heparin in case he needs invasive procedures.     For questions or updates, please contact [place] Please consult [email or web address] for contact info under        Signed, Sanda Klein, MD  12/30/2019, 8:56 AM

## 2019-12-30 NOTE — H&P (View-Only) (Signed)
Hospital Consult    Reason for Consult:  New dialysis access Requesting Physician:  Dr. Marval Regal MRN #:  [id number]  History of Present Illness: This is a 79 y.o. male being seen in consultation for evaluation for new dialysis access.  He is CKD stage IV and is not yet requiring hemodialysis.  He is right arm dominant and would prefer dialysis access placement in his left arm.  He is undergoing work-up for severe aortic valve stenosis and will likely require TAVR.  Nephrology has requested that patient have a dialysis access ready for use prior to TAVR surgery to avoid possible need for tunneled dialysis catheter.  He is on aspirin and Plavix daily.  Vein mapping has not yet been performed.    Based on chart review patient also has a known abdominal aortic aneurysm.  He recently underwent coronary angiography as well as abdominal aortogram however I am unable to review these images through his medical record.  AAA measured 4 cm based on duplex in March 2020.  He denies new or changing abdominal or back pain.  He also denies any discoloration or other tissue changes of bilateral lower extremities.  Past Medical History:  Diagnosis Date   AAA (abdominal aortic aneurysm) ([place])    3.6 cm (01/09/12 ultrasound)   Allergy    Arthritis    Bile duct leak 09/2017   post op   Carotid artery disease ([facility])    Cataract    Chronic kidney disease    CKD, right renal artery stenosis   Coronary artery disease    mild left internal carotid artery stenosis   Glaucoma    Gout    Heart murmur    Hyperlipidemia    Hypertension    Tubular adenoma of colon 2003   Dr. Watt Climes   Vitamin D deficiency     Past Surgical History:  Procedure Laterality Date   ABDOMINAL AORTOGRAM N/A 12/17/2019   Procedure: ABDOMINAL AORTOGRAM;  Surgeon: [name], MD;  Location: [facility];  Service: Cardiovascular;  Laterality: N/A;   BACK SURGERY     CARDIAC CATHETERIZATION     stents   last -[place] N/A 09/26/2017   Procedure: LAPAROSCOPIC CHOLECYSTECTOMY;  Surgeon: Rolm Bookbinder, MD;  Location: [facility];  Service: General;  Laterality: N/A;   ERCP N/A 10/09/2017   Procedure: ENDOSCOPIC RETROGRADE CHOLANGIOPANCREATOGRAPHY (ERCP);  Surgeon: Milus Banister, MD;  Location: [facility];  Service: Endoscopy;  Laterality: N/A;   EYE SURGERY Left 06   retinal detachment   HERNIA REPAIR Right 93   IR RADIOLOGIST EVAL & MGMT  10/29/2017   LUMBAR LAMINECTOMY  06/06/2012   Procedure: MICRODISCECTOMY LUMBAR LAMINECTOMY;  Surgeon: Marybelle Killings, MD;  Location: [facility];  Service: Orthopedics;  Laterality: Left;  Left L4-5 Microdiscectomy   RIGHT/LEFT HEART CATH AND CORONARY ANGIOGRAPHY N/A 12/17/2019   Procedure: RIGHT/LEFT HEART CATH AND CORONARY ANGIOGRAPHY;  Surgeon: [name], MD;  Location: [facility];  Service: Cardiovascular;  Laterality: N/A;   SHOULDER ARTHROSCOPY Right    TOTAL HIP ARTHROPLASTY Left     Allergies  Allergen Reactions   Toprol Xl [Metoprolol Tartrate] Other (See Comments)    Bradycardia with beta blockers   Lipitor [Atorvastatin]     Myalgias    Coumadin [Warfarin Sodium] Rash   Tape Rash    Clear tape   Warfarin Rash  Prior to Admission medications   Medication Sig Start Date End Date Taking? Authorizing Provider  allopurinol (ZYLOPRIM) 300 MG tablet TAKE 1 TABLET BY MOUTH ONCE DAILY Patient taking differently: Take 300 mg by mouth daily.  03/05/18  Yes Vicie Mutters, PA-C  amLODipine (NORVASC) 10 MG tablet Take 1 tablet (10 mg total) by mouth daily. 12/18/19  Yes [name], Tami Lin, PA  aspirin EC 81 MG tablet Take 81 mg by mouth daily.   Yes [provider]  brimonidine (ALPHAGAN) 0.15 % ophthalmic solution Place 1 drop into both eyes 2 (two) times daily.   Yes [provider]  Cholecalciferol (VITAMIN D3) 5000 UNITS TABS Take 5,000 Units  by mouth daily.   Yes [provider]  clopidogrel (PLAVIX) 75 MG tablet TAKE 1 TABLET BY MOUTH ONCE DAILY FOR HEART STENTS Patient taking differently: Take 75 mg by mouth daily.  06/27/17  Yes [name], MD  dorzolamide (TRUSOPT) 2 % ophthalmic solution Place 1 drop into the right eye 2 (two) times daily.    Yes [provider]  fenofibrate micronized (LOFIBRA) 134 MG capsule TAKE 1 CAPSULE BY MOUTH ONCE DAILY Patient taking differently: Take 134 mg by mouth daily before breakfast.  10/02/17  Yes [name], MD  ferrous sulfate 325 (65 FE) MG tablet Take 325 mg by mouth daily with breakfast.   Yes [provider]  furosemide (LASIX) 40 MG tablet Take 40 mg by mouth daily.  08/29/17  Yes [provider]  hydrALAZINE (APRESOLINE) 50 MG tablet Take 1 tablet (50 mg total) by mouth 3 (three) times daily. Start 12/19/19. 12/18/19 03/17/20 Yes [name], Tami Lin, PA  isosorbide mononitrate (IMDUR) 30 MG 24 hr tablet Take 0.5 tablets (15 mg total) by mouth daily. 11/13/19  Yes [name], MD  ketorolac (ACULAR) 0.5 % ophthalmic solution Place 1 drop into the right eye daily as needed (itching eye).  04/27/19  Yes [provider]  nepafenac (ILEVRO) 0.3 % ophthalmic suspension Place 1 drop into the right eye at bedtime.    Yes [provider]  pravastatin (PRAVACHOL) 40 MG tablet TAKE 1 TABLET BY MOUTH AT BEDTIME FOR CHOLESTEROL Patient taking differently: Take 40 mg by mouth daily.  03/05/18  Yes Vicie Mutters, PA-C  triamcinolone cream (KENALOG) 0.1 % Apply 1 application topically daily as needed (rash on Scalp).  07/27/19  Yes [provider]  trimethoprim-polymyxin b (POLYTRIM) ophthalmic solution Place 1 drop into the right eye See admin instructions. Monthly after injection in right eye four times a day 09/17/19  Yes [provider]    Social History   Socioeconomic History   Marital status: Married    Spouse name:  Not on file   Number of children: Not on file   Years of education: Not on file   Highest education level: Not on file  Occupational History   Not on file  Tobacco Use   Smoking status: Never Smoker   Smokeless tobacco: Never Used  Vaping Use   Vaping Use: Never used  Substance and Sexual Activity   Alcohol use: No   Drug use: No   Sexual activity: Not on file  Other Topics Concern   Not on file  Social History Narrative   Not on file   Social Determinants of Health   Financial Resource Strain:    Difficulty of Paying Living Expenses:   Food Insecurity:    Worried About [place] in the Last Year:  Ran Out of Food in the Last Year:   Transportation Needs:    Film/video editor (Medical):    Lack of Transportation (Non-Medical):   Physical Activity:    Days of Exercise per Week:    Minutes of Exercise per Session:   Stress:    Feeling of Stress :   Social Connections:    Frequency of Communication with Friends and Family:    Frequency of Social Gatherings with Friends and Family:    Attends Religious Services:    Active Member of Clubs or Organizations:    Attends Music therapist:    Marital Status:   Intimate Partner Violence:    Fear of Current or Ex-Partner:    Emotionally Abused:    Physically Abused:    Sexually Abused:      Family History  Problem Relation Age of Onset   Other Brother        laryngeal cancer   Heart disease Brother    Heart disease Mother    Heart disease Father    Stroke Father    Heart disease Sister    Diabetes Brother    Colon cancer Neg Hx    Esophageal cancer Neg Hx    Stomach cancer Neg Hx    Rectal cancer Neg Hx     ROS: Otherwise negative unless mentioned in HPI  Physical Examination  Vitals:   12/30/19 0815 12/30/19 1139  BP: (!) 146/54 (!) 120/58  Pulse: 72 66  Resp: 16 18  Temp: 98.5 F (36.9 C) (!) 97.5 F (36.4 C)  SpO2: 98% 96%    Body mass index is 20.72 kg/m.  General:  WDWN in NAD Gait: Not observed HENT: WNL, normocephalic Pulmonary: normal non-labored breathing, without Rales, rhonchi,  wheezing Cardiac: regular Abdomen:  soft, NT/ND, no masses Skin: without rashes Vascular Exam/Pulses: symmetrical radial pulses Extremities: Easily palpable right DP pulse; no easily palpable left pedal pulses however foot is warm to touch with good capillary refill Musculoskeletal: no muscle wasting or atrophy  Neurologic: A&O X 3;  No focal weakness or paresthesias are detected; speech is fluent/normal Psychiatric:  The pt has Normal affect. Lymph:  Unremarkable  CBC    Component Value Date/Time   WBC 6.8 12/30/2019 0818   RBC 2.58 (L) 12/30/2019 0818   HGB 8.0 (L) 12/30/2019 0818   HGB 8.8 (L) 12/10/2019 1209   HCT 25.0 (L) 12/30/2019 0818   HCT 26.9 (L) 12/10/2019 1209   PLT 144 (L) 12/30/2019 0818   PLT 159 12/10/2019 1209   MCV 96.9 12/30/2019 0818   MCV 94 12/10/2019 1209   MCH 31.0 12/30/2019 0818   MCHC 32.0 12/30/2019 0818   RDW 15.6 (H) 12/30/2019 0818   RDW 14.0 12/10/2019 1209   LYMPHSABS 795 (L) 07/15/2017 1405   MONOABS 312 12/20/2016 0924   EOSABS 151 07/15/2017 1405   BASOSABS 58 07/15/2017 1405    BMET    Component Value Date/Time   NA 137 12/30/2019 0818   NA 142 12/24/2019 1314   K 3.8 12/30/2019 0818   CL 104 12/30/2019 0818   CO2 23 12/30/2019 0818   GLUCOSE 96 12/30/2019 0818   BUN 62 (H) 12/30/2019 0818   BUN 47 (H) 12/24/2019 1314   CREATININE 2.99 (H) 12/30/2019 0818   CREATININE 2.80 (H) 07/15/2017 1405   CALCIUM 8.4 (L) 12/30/2019 0818   GFRNONAA 19 (L) 12/30/2019 0818   GFRNONAA 21 (L) 07/15/2017 1405   GFRAA 22 (L) 12/30/2019  0818   GFRAA 24 (L) 07/15/2017 1405    COAGS: Lab Results  Component Value Date   INR 1.27 10/09/2017   INR 1.17 09/23/2017   INR 1.11 06/04/2012     Non-Invasive Vascular Imaging:   Vein mapping pending    ASSESSMENT/PLAN:  This is a 79 y.o. male with CKD stage IV not yet on hemodialysis; patient also has a known 4 cm AAA as of 08/2018  -Vein mapping pending -Patient has an IV in both upper arm cephalic veins; we will wait to restrict an extremity based on vein mapping -Plan will be for AV fistula versus graft placement likely tomorrow -Patient will need a repeat duplex if unable to assess AAA from recent abdominal aortogram pictures -On call vascular surgeon Dr. Trula Slade will evaluate the patient later today and provide further treatment plans   Dagoberto Ligas PA-C [facility] [phone]  I agree with the above.  I have seen and evaluated the patient.  His family was present at the bedside.  We discussed proceeding with a left brachiocephalic fistula tomorrow.  I discussed the risks and benefits which include but are not limited to the risk of steal syndrome, the risk of nonmaturity, and the need for future interventions.  All of his questions have been answered.  Vein mapping as listed below:  +-----------------+-------------+----------+---------+   Right Cephalic   Diameter (cm) Depth (cm) Findings    +-----------------+-------------+----------+---------+   Shoulder        0.23      0.49           +-----------------+-------------+----------+---------+   Prox upper arm     0.26      0.58           +-----------------+-------------+----------+---------+   Mid upper arm     0.23      0.21           +-----------------+-------------+----------+---------+   Dist upper arm     0.23      0.24           +-----------------+-------------+----------+---------+   Antecubital fossa   0.29      0.34   branching   +-----------------+-------------+----------+---------+   Prox forearm      0.26      0.27   branching   +-----------------+-------------+----------+---------+   Mid forearm      0.16      0.32            +-----------------+-------------+----------+---------+   Dist forearm      0.18      0.28           +-----------------+-------------+----------+---------+   Wrist         0.16      0.29           +-----------------+-------------+----------+---------+   +-----------------+-------------+----------+--------------+   Right Basilic   Diameter (cm) Depth (cm)   Findings     +-----------------+-------------+----------+--------------+   Shoulder        0.74                    +-----------------+-------------+----------+--------------+   Prox upper arm     0.49                    +-----------------+-------------+----------+--------------+   Mid upper arm     0.33           branching     +-----------------+-------------+----------+--------------+   Dist upper arm     0.28                    +-----------------+-------------+----------+--------------+  Antecubital fossa   0.19           branching     +-----------------+-------------+----------+--------------+   Prox forearm                  not visualized   +-----------------+-------------+----------+--------------+   Mid forearm                  not visualized   +-----------------+-------------+----------+--------------+   Distal forearm                 not visualized   +-----------------+-------------+----------+--------------+   Wrist                     not visualized   +-----------------+-------------+----------+--------------+   +-----------------+-------------+----------+--------+   Left Cephalic   Diameter (cm) Depth (cm) Findings   +-----------------+-------------+----------+--------+   Shoulder        0.34      0.45          +-----------------+-------------+----------+--------+   Prox upper arm     0.35      0.36           +-----------------+-------------+----------+--------+   Mid upper arm     0.33      0.28          +-----------------+-------------+----------+--------+   Dist upper arm     0.33      0.36          +-----------------+-------------+----------+--------+   Antecubital fossa   0.38      0.32          +-----------------+-------------+----------+--------+   Prox forearm      0.23      0.72          +-----------------+-------------+----------+--------+   Mid forearm      0.22      0.30          +-----------------+-------------+----------+--------+   Dist forearm      0.20      0.36          +-----------------+-------------+----------+--------+   Wrist         0.16      0.26          +-----------------+-------------+----------+--------+   +-----------------+-------------+----------+--------------+   Left Basilic    Diameter (cm) Depth (cm)   Findings     +-----------------+-------------+----------+--------------+   Prox upper arm     0.26                    +-----------------+-------------+----------+--------------+   Mid upper arm     0.23                    +-----------------+-------------+----------+--------------+   Dist upper arm     0.30                    +-----------------+-------------+----------+--------------+   Antecubital fossa   0.21           branching     +-----------------+-------------+----------+--------------+   Prox forearm                  not visualized   +-----------------+-------------+----------+--------------+   Mid forearm                  not visualized   +-----------------+-------------+----------+--------------+   Distal forearm                 not visualized   +-----------------+-------------+----------+--------------+   Wrist  not  visualized   +-----------------+-------------+----------+--------------+  Annamarie Major

## 2019-12-30 NOTE — Evaluation (Signed)
Physical Therapy Evaluation & Discharge Patient Details Name: Terry Garner MRN: [id number] DOB: 03/28/1941 Today's Date: 12/30/2019   History of Present Illness  Pt is an 79 y.o. male admitted 12/28/19 iwth acute onset chest pain. CXR with small bilateral pleural effusions, atelectasis vs. infection. EKG nonacute. PMH includes CAD s/p PCI, severe AS, CKD IV, HTN, HLD, RBBB, abdominal aortic aneurysm, glaucoma.    Clinical Impression  Patient evaluated by Physical Therapy with no further acute PT needs identified. PTA, pt independent with mobility, lives with wife who assists with ADLs/IADLs as needed. Today, pt moving well at supervision-level secondary to low vision (pt legally blind) in unfamiliar environment. All education has been completed and the patient has no further questions. Acute PT is signing off. Thank you for this referral.    Follow Up Recommendations No PT follow up;Supervision for mobility/OOB    Equipment Recommendations  None recommended by PT    Recommendations for Other Services       Precautions / Restrictions Precautions Precautions: Fall;Other (comment) Precaution Comments: Legally blind (h/o glaucoma) Restrictions Weight Bearing Restrictions: No      Mobility  Bed Mobility Overal bed mobility: Modified Independent             General bed mobility comments: HOB elevated  Transfers Overall transfer level: Independent Equipment used: None                Ambulation/Gait Ambulation/Gait assistance: Supervision Gait Distance (Feet): 40 Feet Assistive device: None;IV Pole Gait Pattern/deviations: Step-through pattern;Decreased stride length;Trunk flexed Gait velocity: Decreased   General Gait Details: Slow, steady gait with and without UE support on IV pole; supervision for safety due to low vision in unfamiliar environment  Stairs            Wheelchair Mobility    Modified Rankin (Stroke Patients Only)       Balance Overall  balance assessment: Mild deficits observed, not formally tested                                           Pertinent Vitals/Pain Pain Assessment: Faces Faces Pain Scale: No hurt Pain Intervention(s): Monitored during session    Home Living                        Prior Function                 Hand Dominance        Extremity/Trunk Assessment   Upper Extremity Assessment Upper Extremity Assessment: Overall WFL for tasks assessed    Lower Extremity Assessment Lower Extremity Assessment: Overall WFL for tasks assessed       Communication      Cognition Arousal/Alertness: Awake/alert Behavior During Therapy: WFL for tasks assessed/performed Overall Cognitive Status: Within Functional Limits for tasks assessed                                 General Comments: WFL for simple tasks; not formally assessed      General Comments      Exercises     Assessment/Plan    PT Assessment Patent does not need any further PT services  PT Problem List         PT Treatment Interventions      PT Goals (Current  goals can be found in the Care Plan section)  Acute Rehab PT Goals PT Goal Formulation: All assessment and education complete, DC therapy    Frequency     Barriers to discharge        Co-evaluation               AM-PAC PT "6 Clicks" Mobility  Outcome Measure Help needed turning from your back to your side while in a flat bed without using bedrails?: None Help needed moving from lying on your back to sitting on the side of a flat bed without using bedrails?: None Help needed moving to and from a bed to a chair (including a wheelchair)?: None Help needed standing up from a chair using your arms (e.g., wheelchair or bedside chair)?: None Help needed to walk in hospital room?: None Help needed climbing 3-5 steps with a railing? : A Little 6 Click Score: 23    End of Session   Activity Tolerance: Patient  tolerated treatment well Patient left: in chair;with call bell/phone within reach;with chair alarm set;with nursing/sitter in room Nurse Communication: Mobility status PT Visit Diagnosis: Other abnormalities of gait and mobility (R26.89)    Time: 0034-9179 PT Time Calculation (min) (ACUTE ONLY): 15 min   Charges:   PT Evaluation $PT Eval Low Complexity: Novelty, PT, DPT Acute Rehabilitation Services  Pager [phone] Office [place] 12/30/2019, 8:20 AM

## 2019-12-30 NOTE — Consult Note (Addendum)
Hospital Consult    Reason for Consult:  New dialysis access Requesting Physician:  Dr. Marval Regal MRN #:  [id number]  History of Present Illness: This is a 79 y.o. male being seen in consultation for evaluation for new dialysis access.  He is CKD stage IV and is not yet requiring hemodialysis.  He is right arm dominant and would prefer dialysis access placement in his left arm.  He is undergoing work-up for severe aortic valve stenosis and will likely require TAVR.  Nephrology has requested that patient have a dialysis access ready for use prior to TAVR surgery to avoid possible need for tunneled dialysis catheter.  He is on aspirin and Plavix daily.  Vein mapping has not yet been performed.    Based on chart review patient also has a known abdominal aortic aneurysm.  He recently underwent coronary angiography as well as abdominal aortogram however I am unable to review these images through his medical record.  AAA measured 4 cm based on duplex in March 2020.  He denies new or changing abdominal or back pain.  He also denies any discoloration or other tissue changes of bilateral lower extremities.  Past Medical History:  Diagnosis Date  . AAA (abdominal aortic aneurysm) (HCC)    3.6 cm (01/09/12 ultrasound)  . Allergy   . Arthritis   . Bile duct leak 09/2017   post op  . Carotid artery disease ([place])   . Cataract   . Chronic kidney disease    CKD, right renal artery stenosis  . Coronary artery disease    mild left internal carotid artery stenosis  . Glaucoma   . Gout   . Heart murmur   . Hyperlipidemia   . Hypertension   . Tubular adenoma of colon 2003   Dr. Watt Climes  . Vitamin D deficiency     Past Surgical History:  Procedure Laterality Date  . ABDOMINAL AORTOGRAM N/A 12/17/2019   Procedure: ABDOMINAL AORTOGRAM;  Surgeon: [name], MD;  Location: [facility];  Service: Cardiovascular;  Laterality: N/A;  . BACK SURGERY    . CARDIAC CATHETERIZATION     stents   last -07  . CARDIAC STENTS    . CERVICAL DISC SURGERY    . CHOLECYSTECTOMY N/A 09/26/2017   Procedure: LAPAROSCOPIC CHOLECYSTECTOMY;  Surgeon: Rolm Bookbinder, MD;  Location: [facility];  Service: General;  Laterality: N/A;  . ERCP N/A 10/09/2017   Procedure: ENDOSCOPIC RETROGRADE CHOLANGIOPANCREATOGRAPHY (ERCP);  Surgeon: Milus Banister, MD;  Location: [facility];  Service: Endoscopy;  Laterality: N/A;  . EYE SURGERY Left 06   retinal detachment  . HERNIA REPAIR Right 93  . IR RADIOLOGIST EVAL & MGMT  10/29/2017  . LUMBAR LAMINECTOMY  06/06/2012   Procedure: MICRODISCECTOMY LUMBAR LAMINECTOMY;  Surgeon: Marybelle Killings, MD;  Location: [facility];  Service: Orthopedics;  Laterality: Left;  Left L4-5 Microdiscectomy  . RIGHT/LEFT HEART CATH AND CORONARY ANGIOGRAPHY N/A 12/17/2019   Procedure: RIGHT/LEFT HEART CATH AND CORONARY ANGIOGRAPHY;  Surgeon: [name], MD;  Location: [facility];  Service: Cardiovascular;  Laterality: N/A;  . SHOULDER ARTHROSCOPY Right   . TOTAL HIP ARTHROPLASTY Left     Allergies  Allergen Reactions  . Toprol Xl [Metoprolol Tartrate] Other (See Comments)    Bradycardia with beta blockers  . Lipitor [Atorvastatin]     Myalgias   . Coumadin [Warfarin Sodium] Rash  . Tape Rash    Clear tape  . Warfarin Rash  Prior to Admission medications   Medication Sig Start Date End Date Taking? Authorizing Provider  allopurinol (ZYLOPRIM) 300 MG tablet TAKE 1 TABLET BY MOUTH ONCE DAILY Patient taking differently: Take 300 mg by mouth daily.  03/05/18  Yes Vicie Mutters, PA-C  amLODipine (NORVASC) 10 MG tablet Take 1 tablet (10 mg total) by mouth daily. 12/18/19  Yes [name], Tami Lin, PA  aspirin EC 81 MG tablet Take 81 mg by mouth daily.   Yes [provider]  brimonidine (ALPHAGAN) 0.15 % ophthalmic solution Place 1 drop into both eyes 2 (two) times daily.   Yes [provider]  Cholecalciferol (VITAMIN D3) 5000 UNITS TABS Take 5,000 Units  by mouth daily.   Yes [provider]  clopidogrel (PLAVIX) 75 MG tablet TAKE 1 TABLET BY MOUTH ONCE DAILY FOR HEART STENTS Patient taking differently: Take 75 mg by mouth daily.  06/27/17  Yes [name], MD  dorzolamide (TRUSOPT) 2 % ophthalmic solution Place 1 drop into the right eye 2 (two) times daily.    Yes [provider]  fenofibrate micronized (LOFIBRA) 134 MG capsule TAKE 1 CAPSULE BY MOUTH ONCE DAILY Patient taking differently: Take 134 mg by mouth daily before breakfast.  10/02/17  Yes [name], MD  ferrous sulfate 325 (65 FE) MG tablet Take 325 mg by mouth daily with breakfast.   Yes [provider]  furosemide (LASIX) 40 MG tablet Take 40 mg by mouth daily.  08/29/17  Yes [provider]  hydrALAZINE (APRESOLINE) 50 MG tablet Take 1 tablet (50 mg total) by mouth 3 (three) times daily. Start 12/19/19. 12/18/19 03/17/20 Yes [name], Tami Lin, PA  isosorbide mononitrate (IMDUR) 30 MG 24 hr tablet Take 0.5 tablets (15 mg total) by mouth daily. 11/13/19  Yes [name], MD  ketorolac (ACULAR) 0.5 % ophthalmic solution Place 1 drop into the right eye daily as needed (itching eye).  04/27/19  Yes [provider]  nepafenac (ILEVRO) 0.3 % ophthalmic suspension Place 1 drop into the right eye at bedtime.    Yes [provider]  pravastatin (PRAVACHOL) 40 MG tablet TAKE 1 TABLET BY MOUTH AT BEDTIME FOR CHOLESTEROL Patient taking differently: Take 40 mg by mouth daily.  03/05/18  Yes Vicie Mutters, PA-C  triamcinolone cream (KENALOG) 0.1 % Apply 1 application topically daily as needed (rash on Scalp).  07/27/19  Yes [provider]  trimethoprim-polymyxin b (POLYTRIM) ophthalmic solution Place 1 drop into the right eye See admin instructions. Monthly after injection in right eye four times a day 09/17/19  Yes [provider]    Social History   Socioeconomic History  . Marital status: Married    Spouse name:  Not on file  . Number of children: Not on file  . Years of education: Not on file  . Highest education level: Not on file  Occupational History  . Not on file  Tobacco Use  . Smoking status: Never Smoker  . Smokeless tobacco: Never Used  Vaping Use  . Vaping Use: Never used  Substance and Sexual Activity  . Alcohol use: No  . Drug use: No  . Sexual activity: Not on file  Other Topics Concern  . Not on file  Social History Narrative  . Not on file   Social Determinants of Health   Financial Resource Strain:   . Difficulty of Paying Living Expenses:   Food Insecurity:   . Worried About Charity fundraiser in the Last Year:   .  Ran Out of Food in the Last Year:   Transportation Needs:   . Film/video editor (Medical):   [name] Lack of Transportation (Non-Medical):   Physical Activity:   . Days of Exercise per Week:   . Minutes of Exercise per Session:   Stress:   . Feeling of Stress :   Social Connections:   . Frequency of Communication with Friends and Family:   . Frequency of Social Gatherings with Friends and Family:   . Attends Religious Services:   . Active Member of Clubs or Organizations:   . Attends Archivist Meetings:   [name] Marital Status:   Intimate Partner Violence:   . Fear of Current or Ex-Partner:   . Emotionally Abused:   [name] Physically Abused:   . Sexually Abused:      Family History  Problem Relation Age of Onset  . Other Brother        laryngeal cancer  . Heart disease Brother   . Heart disease Mother   . Heart disease Father   . Stroke Father   . Heart disease Sister   . Diabetes Brother   . Colon cancer Neg Hx   . Esophageal cancer Neg Hx   . Stomach cancer Neg Hx   . Rectal cancer Neg Hx     ROS: Otherwise negative unless mentioned in HPI  Physical Examination  Vitals:   12/30/19 0815 12/30/19 1139  BP: (!) 146/54 (!) 120/58  Pulse: 72 66  Resp: 16 18  Temp: 98.5 F (36.9 C) (!) 97.5 F (36.4 C)  SpO2: 98% 96%    Body mass index is 20.72 kg/m.  General:  WDWN in NAD Gait: Not observed HENT: WNL, normocephalic Pulmonary: normal non-labored breathing, without Rales, rhonchi,  wheezing Cardiac: regular Abdomen:  soft, NT/ND, no masses Skin: without rashes Vascular Exam/Pulses: symmetrical radial pulses Extremities: Easily palpable right DP pulse; no easily palpable left pedal pulses however foot is warm to touch with good capillary refill Musculoskeletal: no muscle wasting or atrophy  Neurologic: A&O X 3;  No focal weakness or paresthesias are detected; speech is fluent/normal Psychiatric:  The pt has Normal affect. Lymph:  Unremarkable  CBC    Component Value Date/Time   WBC 6.8 12/30/2019 0818   RBC 2.58 (L) 12/30/2019 0818   HGB 8.0 (L) 12/30/2019 0818   HGB 8.8 (L) 12/10/2019 1209   HCT 25.0 (L) 12/30/2019 0818   HCT 26.9 (L) 12/10/2019 1209   PLT 144 (L) 12/30/2019 0818   PLT 159 12/10/2019 1209   MCV 96.9 12/30/2019 0818   MCV 94 12/10/2019 1209   MCH 31.0 12/30/2019 0818   MCHC 32.0 12/30/2019 0818   RDW 15.6 (H) 12/30/2019 0818   RDW 14.0 12/10/2019 1209   LYMPHSABS 795 (L) 07/15/2017 1405   MONOABS 312 12/20/2016 0924   EOSABS 151 07/15/2017 1405   BASOSABS 58 07/15/2017 1405    BMET    Component Value Date/Time   NA 137 12/30/2019 0818   NA 142 12/24/2019 1314   K 3.8 12/30/2019 0818   CL 104 12/30/2019 0818   CO2 23 12/30/2019 0818   GLUCOSE 96 12/30/2019 0818   BUN 62 (H) 12/30/2019 0818   BUN 47 (H) 12/24/2019 1314   CREATININE 2.99 (H) 12/30/2019 0818   CREATININE 2.80 (H) 07/15/2017 1405   CALCIUM 8.4 (L) 12/30/2019 0818   GFRNONAA 19 (L) 12/30/2019 0818   GFRNONAA 21 (L) 07/15/2017 1405   GFRAA 22 (L) 12/30/2019  0818   GFRAA 24 (L) 07/15/2017 1405    COAGS: Lab Results  Component Value Date   INR 1.27 10/09/2017   INR 1.17 09/23/2017   INR 1.11 06/04/2012     Non-Invasive Vascular Imaging:   Vein mapping pending    ASSESSMENT/PLAN:  This is a 79 y.o. male with CKD stage IV not yet on hemodialysis; patient also has a known 4 cm AAA as of 08/2018  -Vein mapping pending -Patient has an IV in both upper arm cephalic veins; we will wait to restrict an extremity based on vein mapping -Plan will be for AV fistula versus graft placement likely tomorrow -Patient will need a repeat duplex if unable to assess AAA from recent abdominal aortogram pictures -On call vascular surgeon Dr. Trula Slade will evaluate the patient later today and provide further treatment plans   Dagoberto Ligas PA-C [facility] [phone]  I agree with the above.  I have seen and evaluated the patient.  His family was present at the bedside.  We discussed proceeding with a left brachiocephalic fistula tomorrow.  I discussed the risks and benefits which include but are not limited to the risk of steal syndrome, the risk of nonmaturity, and the need for future interventions.  All of his questions have been answered.  Vein mapping as listed below:  +-----------------+-------------+----------+---------+  Right Cephalic  Diameter (cm)Depth (cm)Findings   +-----------------+-------------+----------+---------+  Shoulder       0.23     0.49         +-----------------+-------------+----------+---------+  Prox upper arm    0.26     0.58         +-----------------+-------------+----------+---------+  Mid upper arm    0.23     0.21         +-----------------+-------------+----------+---------+  Dist upper arm    0.23     0.24         +-----------------+-------------+----------+---------+  Antecubital fossa  0.29     0.34  branching  +-----------------+-------------+----------+---------+  Prox forearm     0.26     0.27  branching  +-----------------+-------------+----------+---------+  Mid forearm     0.16     0.32          +-----------------+-------------+----------+---------+  Dist forearm     0.18     0.28         +-----------------+-------------+----------+---------+  Wrist        0.16     0.29         +-----------------+-------------+----------+---------+   +-----------------+-------------+----------+--------------+  Right Basilic  Diameter (cm)Depth (cm)  Findings    +-----------------+-------------+----------+--------------+  Shoulder       0.74                 +-----------------+-------------+----------+--------------+  Prox upper arm    0.49                 +-----------------+-------------+----------+--------------+  Mid upper arm    0.33         branching    +-----------------+-------------+----------+--------------+  Dist upper arm    0.28                 +-----------------+-------------+----------+--------------+  Antecubital fossa  0.19         branching    +-----------------+-------------+----------+--------------+  Prox forearm               not visualized  +-----------------+-------------+----------+--------------+  Mid forearm               not  visualized  +-----------------+-------------+----------+--------------+  Distal forearm              not visualized  +-----------------+-------------+----------+--------------+  Wrist                  not visualized  +-----------------+-------------+----------+--------------+   +-----------------+-------------+----------+--------+  Left Cephalic  Diameter (cm)Depth (cm)Findings  +-----------------+-------------+----------+--------+  Shoulder       0.34     0.45        +-----------------+-------------+----------+--------+  Prox upper arm    0.35     0.36         +-----------------+-------------+----------+--------+  Mid upper arm    0.33     0.28        +-----------------+-------------+----------+--------+  Dist upper arm    0.33     0.36        +-----------------+-------------+----------+--------+  Antecubital fossa  0.38     0.32        +-----------------+-------------+----------+--------+  Prox forearm     0.23     0.72        +-----------------+-------------+----------+--------+  Mid forearm     0.22     0.30        +-----------------+-------------+----------+--------+  Dist forearm     0.20     0.36        +-----------------+-------------+----------+--------+  Wrist        0.16     0.26        +-----------------+-------------+----------+--------+   +-----------------+-------------+----------+--------------+  Left Basilic   Diameter (cm)Depth (cm)  Findings    +-----------------+-------------+----------+--------------+  Prox upper arm    0.26                 +-----------------+-------------+----------+--------------+  Mid upper arm    0.23                 +-----------------+-------------+----------+--------------+  Dist upper arm    0.30                 +-----------------+-------------+----------+--------------+  Antecubital fossa  0.21         branching    +-----------------+-------------+----------+--------------+  Prox forearm               not visualized  +-----------------+-------------+----------+--------------+  Mid forearm               not visualized  +-----------------+-------------+----------+--------------+  Distal forearm              not visualized  +-----------------+-------------+----------+--------------+  Wrist                  not  visualized  +-----------------+-------------+----------+--------------+  Annamarie Major

## 2019-12-30 NOTE — Progress Notes (Signed)
12/30/2019 PT TAVR Pre-Assessment  HPI: Pt is an 79 y.o. male admitted 12/28/19 with acute onset chest pain. CXR with small bilateral pleural effusions, atelectasis vs. infection. 7/7 pt in afib with RVR. Undergoing TAVR workup. PMH includes CAD s/p PCI, severe AS, CKD IV, HTN, HLD, RBBB, abdominal aortic aneurysm, glaucoma.   Clinical Impression Statement: Pt is an 79 y.o. male being assessed for pre-TAVR.  Pt reports symptoms of shortness of breath with activity.  Pt ambulated 814 ft during the 6 minute walk test requiring 0 rest breaks. 5 meter walk test produced an average gait speed of 2.01 ft/sec which does not indicate high fall risk.  RPE was 8 and dyspnea was 3/10 during mobility.  Pt was limited by mild shortness of breath.  Pt's frailty rating was 3 which is considered managing well.  All education has been completed and the patient has no further questions. Acute PT is signing off. Thank you for this referral.  6 Minute Walk Test:   Total Distance Walked: 630 ft.    Did the pt need a rest break? No  Comments: At conclusion of 6-minute walk task, pt reports, "I'm ready to walk further."   Pre-Test Post-Test  BP 114/54 122/58  HR 70 102  O2 saturations (indicated RA or L/min Marks) 98% on RA 99% on RA  Modified Borg Dyspnea Scale (0 none-10 maximal) 0/10 3/10  RPE (6 very light-10 very hard) 6 8    5  Meter Walk Test: average gait speed = 2.01 ft/sec (<1.8 ft/sec indicates high fall risk)   Clinical Frailty Scale (1 very fit - 9 terminally ill): 3 (</= 5/12 is considered frail)   Mabeline Caras, PT, DPT Acute Rehabilitation Services  Pager [phone] Office [phone]

## 2019-12-30 NOTE — Evaluation (Signed)
Physical Therapy Evaluation & Discharge Patient Details Name: Terry Garner MRN: [id number] DOB: 07/11/1940 Today's Date: 12/30/2019   History of Present Illness  Pt is an 79 y.o. male admitted 12/28/19 iwth acute onset chest pain. CXR with small bilateral pleural effusions, atelectasis vs. infection. EKG nonacute. PMH includes CAD s/p PCI, severe AS, CKD IV, HTN, HLD, RBBB, abdominal aortic aneurysm, glaucoma.    Clinical Impression  Pt seen for additional session for Pre-TAVR assessment. Please see Progress Note for "PT TAVR Pre-Assessment" from 12/30/19. All education has been completed and the patient has no further questions.AcutePT is signing off. Thank you for this referral.    Follow Up Recommendations No PT follow up;Supervision for mobility/OOB    Equipment Recommendations  None recommended by PT    Recommendations for Other Services       Precautions / Restrictions Precautions Precautions: Fall;Other (comment) Precaution Comments: Legally blind (h/o glaucoma) Restrictions Weight Bearing Restrictions: No      Mobility  Bed Mobility Overal bed mobility: Modified Independent             General bed mobility comments: HOB elevated  Transfers Overall transfer level: Independent Equipment used: None             General transfer comment: Indep to stand from bed and low toilet height  Ambulation/Gait Ambulation/Gait assistance: Supervision Gait Distance (Feet): 860 Feet Assistive device: None;IV Pole Gait Pattern/deviations: Step-through pattern;Decreased stride length;Trunk flexed Gait velocity: 2.01 ft/sec Gait velocity interpretation: 1.31 - 2.62 ft/sec, indicative of limited community ambulator General Gait Details: Slow, steady gait with and without UE support on IV pole; supervision for safety due to low vision in unfamiliar environment  Stairs            Wheelchair Mobility    Modified Rankin (Stroke Patients Only)       Balance  Overall balance assessment: Mild deficits observed, not formally tested                                           Pertinent Vitals/Pain Pain Assessment: No/denies pain Pain Intervention(s): Monitored during session    Home Living Family/patient expects to be discharged to:: Private residence Living Arrangements: Spouse/significant other Available Help at Discharge: Family;Available 24 hours/day Type of Home: House Home Access: Ramped entrance     Home Layout: One level Home Equipment: Walker - 4 wheels;Bedside commode;Shower seat      Prior Function Level of Independence: Independent         Comments: Ambulates without DME. Wife assists with IADLs (including medication management) and ADLs as needed     Hand Dominance        Extremity/Trunk Assessment   Upper Extremity Assessment Upper Extremity Assessment: Overall WFL for tasks assessed    Lower Extremity Assessment Lower Extremity Assessment: Overall WFL for tasks assessed       Communication   Communication: HOH  Cognition Arousal/Alertness: Awake/alert Behavior During Therapy: WFL for tasks assessed/performed Overall Cognitive Status: Within Functional Limits for tasks assessed                                 General Comments: WFL for simple tasks; not formally assessed      General Comments General comments (skin integrity, edema, etc.): Wife and son present. Pre-TAVR assessment completed  Exercises     Assessment/Plan    PT Assessment Patent does not need any further PT services  PT Problem List         PT Treatment Interventions      PT Goals (Current goals can be found in the Care Plan section)  Acute Rehab PT Goals PT Goal Formulation: All assessment and education complete, DC therapy    Frequency     Barriers to discharge        Co-evaluation               AM-PAC PT "6 Clicks" Mobility  Outcome Measure Help needed turning from your  back to your side while in a flat bed without using bedrails?: None Help needed moving from lying on your back to sitting on the side of a flat bed without using bedrails?: None Help needed moving to and from a bed to a chair (including a wheelchair)?: None Help needed standing up from a chair using your arms (e.g., wheelchair or bedside chair)?: None Help needed to walk in hospital room?: None   6 Click Score: 20    End of Session Equipment Utilized During Treatment: Gait belt Activity Tolerance: Patient tolerated treatment well Patient left: in bed;with call bell/phone within reach;with family/visitor present Nurse Communication: Mobility status PT Visit Diagnosis: Other abnormalities of gait and mobility (R26.89)    Time: 6945-0388 PT Time Calculation (min) (ACUTE ONLY): 25 min   Charges:   PT Evaluation $PT Eval Low Complexity: 1 Low (Pre-TAVR assessment)        Mabeline Caras, PT, DPT Acute Rehabilitation Services  Pager [phone] Office [place] 12/30/2019, 4:10 PM

## 2019-12-30 NOTE — Progress Notes (Signed)
Carotid duplex bilateral and upper extremity vein mapping studies completed.   See Cv Proc for preliminary results.   Darlin Coco

## 2019-12-30 NOTE — Progress Notes (Signed)
Patient ID: Terry Garner, male   DOB: Jul 21, 1940, 79 y.o.   MRN: [id number] S: Feels better this morning, no chest pain since admission.  Unfortunately he developed atrial fibrillation with RVR this morning. O:BP (!) 146/54 (BP Location: Right Arm)   Pulse 72   Temp 98.5 F (36.9 C) (Oral)   Resp 16   Ht 6\' 2"  (1.88 m)   Wt 73.2 kg Comment: scale b  SpO2 98%   BMI 20.72 kg/m   Intake/Output Summary (Last 24 hours) at 12/30/2019 1020 Last data filed at 12/30/2019 0943 Gross per 24 hour  Intake 1320.22 ml  Output 1175 ml  Net 145.22 ml   Intake/Output: I/O last 3 completed shifts: In: 1670.2 [P.O.:1200; I.V.:470.2] Out: 1175 [Urine:1175]  Intake/Output this shift:  Total I/O In: 440 [P.O.:440] Out: -  Weight change: 0.635 kg Gen: NAD CVS:  IRR, III/VI SEM around precordium, no rub Resp:cta Abd: benign Ext: no edema  Recent Labs  Lab 12/24/19 1314 12/28/19 1410 12/28/19 1457 12/30/19 0818  NA 142 138  --  137  K 4.4 3.7  --  3.8  CL 105 106  --  104  CO2 24 22  --  23  GLUCOSE 92 150*  --  96  BUN 47* 58*  --  62*  CREATININE 2.89* 3.15*  --  2.99*  ALBUMIN  --   --  2.9* 2.5*  CALCIUM 8.7 8.2*  --  8.4*  PHOS  --   --   --  3.9  AST  --   --  45*  --   ALT  --   --  18  --    Liver Function Tests: Recent Labs  Lab 12/24/19 1314 12/28/19 1457 12/30/19 0818  AST  --  45*  --   ALT  --  18  --   ALKPHOS  --  41  --   BILITOT  --  0.8  --   PROT 5.8* 5.1*  --   ALBUMIN  --  2.9* 2.5*   Recent Labs  Lab 12/28/19 1457  LIPASE 41   No results for input(s): AMMONIA in the last 168 hours. CBC: Recent Labs  Lab 12/28/19 1410 12/30/19 0818  WBC 8.6 6.8  HGB 8.2* 8.0*  HCT 26.7* 25.0*  MCV 97.8 96.9  PLT 147* 144*   Cardiac Enzymes: No results for input(s): CKTOTAL, CKMB, CKMBINDEX, TROPONINI in the last 168 hours. CBG: No results for input(s): GLUCAP in the last 168 hours.  Iron Studies:  Recent Labs    12/30/19 0818  IRON 25*  TIBC 291   FERRITIN 289   Studies/Results: DG Chest 2 View  Result Date: 12/28/2019 CLINICAL DATA:  Chest pain.  Eight a 10 pain constant nonradiating EXAM: CHEST - 2 VIEW COMPARISON:  09/25/2017 FINDINGS: Small bilateral pleural effusions are smaller than on the study of 09/25/2017. Obscured LEFT hemidiaphragm. Cardiomediastinal contours are stable.  Heart size mildly enlarged. On limited assessment visualized skeletal structures are unremarkable. IMPRESSION: Small bilateral pleural effusions are smaller than on 09/25/2017. There is increased opacity at the LEFT lung base perhaps in the lingula that may reflect atelectasis or developing infection. Electronically Signed   By: Zetta Bills M.D.   On: 12/28/2019 15:02   . allopurinol  300 mg Oral Daily  . amLODipine  10 mg Oral Daily  . aspirin EC  81 mg Oral Daily  . brimonidine  1 drop Both Eyes BID  . cholecalciferol  5,000  Units Oral Daily  . clopidogrel  75 mg Oral Daily  . dorzolamide  1 drop Right Eye BID  . fenofibrate  160 mg Oral Daily  . ferrous sulfate  325 mg Oral Q breakfast  . furosemide  40 mg Oral Daily  . hydrALAZINE  25 mg Oral TID  . isosorbide mononitrate  30 mg Oral BID  . metoprolol tartrate  25 mg Oral BID  . nepafenac  1 drop Right Eye QHS  . pravastatin  40 mg Oral Daily    BMET    Component Value Date/Time   NA 137 12/30/2019 0818   NA 142 12/24/2019 1314   K 3.8 12/30/2019 0818   CL 104 12/30/2019 0818   CO2 23 12/30/2019 0818   GLUCOSE 96 12/30/2019 0818   BUN 62 (H) 12/30/2019 0818   BUN 47 (H) 12/24/2019 1314   CREATININE 2.99 (H) 12/30/2019 0818   CREATININE 2.80 (H) 07/15/2017 1405   CALCIUM 8.4 (L) 12/30/2019 0818   GFRNONAA 19 (L) 12/30/2019 0818   GFRNONAA 21 (L) 07/15/2017 1405   GFRAA 22 (L) 12/30/2019 0818   GFRAA 24 (L) 07/15/2017 1405   CBC    Component Value Date/Time   WBC 6.8 12/30/2019 0818   RBC 2.58 (L) 12/30/2019 0818   HGB 8.0 (L) 12/30/2019 0818   HGB 8.8 (L) 12/10/2019 1209    HCT 25.0 (L) 12/30/2019 0818   HCT 26.9 (L) 12/10/2019 1209   PLT 144 (L) 12/30/2019 0818   PLT 159 12/10/2019 1209   MCV 96.9 12/30/2019 0818   MCV 94 12/10/2019 1209   MCH 31.0 12/30/2019 0818   MCHC 32.0 12/30/2019 0818   RDW 15.6 (H) 12/30/2019 0818   RDW 14.0 12/10/2019 1209   LYMPHSABS 795 (L) 07/15/2017 1405   MONOABS 312 12/20/2016 0924   EOSABS 151 07/15/2017 1405   BASOSABS 58 07/15/2017 1405    Assessment/Plan: 1.  Severe Aortic stenosis- being worked up for TAVR and will require CT scan with contrast to assess the aortic root and thoracoabdominal aorta.  He has had several contrasted studies in the past 2 weeks and his renal function has remained relatively stable so hopefully he will tolerate the CT scan, however I did discuss his advanced CKD stage IV with he and his wife and the possible need for dialysis following TAVR surgery.  He understands and is willing to pursue RRT if needed. 1. Discussed with Dr. Sallyanne Kuster and plan for CT scan with IV contrast today after pre-procedure IV NS to minimize contrast toxicity 2. Will also consult [facility] for  AVF/aVG creation in the near future which will need to be mature prior to TAVR procedure to avoid [facility] use  2. CAD with unstable angina- has 80% second diagonal stenosis.  Currently CP free on IV ntg.  Plan per cardiology 3. 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  Scr has ranged 2.6-3.4 since 2015.  Still within his baseline.  Will need further education regarding HD and vascular access placement in preparation.  Will consult [facility] for vein mapping.  Currently stable and no indication for HD.  Continue to follow UOP and Scr. 4. New onset Atrial fibrillation with RVR- per cardiology  5. Anemia of CKD- low iron stores, will start IV Iron and start ESA 6. CHF- presumably due to severe AS.  For amyloid workup per Cardiology.  Donetta Potts, MD [employer] [phone]

## 2019-12-31 ENCOUNTER — Inpatient Hospital Stay (HOSPITAL_COMMUNITY): Payer: Medicare Other | Admitting: Certified Registered"

## 2019-12-31 ENCOUNTER — Encounter (HOSPITAL_COMMUNITY): Payer: Self-pay | Admitting: Internal Medicine

## 2019-12-31 ENCOUNTER — Encounter (HOSPITAL_COMMUNITY): Payer: Medicare Other

## 2019-12-31 ENCOUNTER — Encounter (HOSPITAL_COMMUNITY)
Admission: EM | Disposition: A | Discharge status: Home / Self Care | Payer: Self-pay | Source: Home / Self Care | Attending: Internal Medicine

## 2019-12-31 ENCOUNTER — Ambulatory Visit (HOSPITAL_COMMUNITY): Payer: Medicare Other

## 2019-12-31 ENCOUNTER — Ambulatory Visit (HOSPITAL_COMMUNITY)
Admission: RE | Admit: 2019-12-31 | Payer: Medicare Other | Source: Ambulatory Visit | Attending: Cardiovascular Disease | Admitting: Cardiovascular Disease

## 2019-12-31 HISTORY — PX: AV FISTULA PLACEMENT: SHX1204

## 2019-12-31 LAB — CBC
HCT: 21.9 % — ABNORMAL LOW (ref 39.0–52.0)
HCT: 27.1 % — ABNORMAL LOW (ref 39.0–52.0)
Hemoglobin: 7.1 g/dL — ABNORMAL LOW (ref 13.0–17.0)
Hemoglobin: 8.6 g/dL — ABNORMAL LOW (ref 13.0–17.0)
MCH: 30.7 pg (ref 26.0–34.0)
MCH: 30.6 pg (ref 26.0–34.0)
MCHC: 32.4 g/dL (ref 30.0–36.0)
MCHC: 31.7 g/dL (ref 30.0–36.0)
MCV: 94.8 fL (ref 80.0–100.0)
MCV: 96.4 fL (ref 80.0–100.0)
Platelets: 139 10*3/uL — ABNORMAL LOW (ref 150–400)
Platelets: 156 10*3/uL (ref 150–400)
RBC: 2.31 MIL/uL — ABNORMAL LOW (ref 4.22–5.81)
RBC: 2.81 MIL/uL — ABNORMAL LOW (ref 4.22–5.81)
RDW: 15.7 % — ABNORMAL HIGH (ref 11.5–15.5)
RDW: 15.2 % (ref 11.5–15.5)
WBC: 5.7 10*3/uL (ref 4.0–10.5)
WBC: 5 10*3/uL (ref 4.0–10.5)
nRBC: 0 % (ref 0.0–0.2)
nRBC: 0 % (ref 0.0–0.2)

## 2019-12-31 LAB — SURGICAL PCR SCREEN
MRSA, PCR: NEGATIVE
Staphylococcus aureus: NEGATIVE

## 2019-12-31 LAB — FOLATE: Folate: 9.9 ng/mL (ref >5.9)

## 2019-12-31 LAB — PARATHYROID HORMONE, INTACT (NO CA): PTH: 26 pg/mL (ref 15–65)

## 2019-12-31 LAB — RENAL FUNCTION PANEL
Albumin: 2.1 g/dL — ABNORMAL LOW (ref 3.5–5.0)
Anion gap: 7 (ref 5–15)
BUN: 60 mg/dL — ABNORMAL HIGH (ref 8–23)
CO2: 22 mmol/L (ref 22–32)
Calcium: 7.7 mg/dL — ABNORMAL LOW (ref 8.9–10.3)
Chloride: 102 mmol/L (ref 98–111)
Creatinine, Ser: 2.99 mg/dL — ABNORMAL HIGH (ref 0.61–1.24)
GFR calc Af Amer: 22 mL/min — ABNORMAL LOW (ref >60)
GFR calc non Af Amer: 19 mL/min — ABNORMAL LOW (ref >60)
Glucose, Bld: 132 mg/dL — ABNORMAL HIGH (ref 70–99)
Phosphorus: 3.5 mg/dL (ref 2.5–4.6)
Potassium: 3.8 mmol/L (ref 3.5–5.1)
Sodium: 131 mmol/L — ABNORMAL LOW (ref 135–145)

## 2019-12-31 LAB — RETICULOCYTES
Immature Retic Fract: 10.5 % (ref 2.3–15.9)
RBC.: 2.84 MIL/uL — ABNORMAL LOW (ref 4.22–5.81)
Retic Count, Absolute: 44.9 10*3/uL (ref 19.0–186.0)
Retic Ct Pct: 1.6 % (ref 0.4–3.1)

## 2019-12-31 LAB — IRON AND TIBC
Iron: 324 ug/dL — ABNORMAL HIGH (ref 45–182)
Saturation Ratios: 126 % — ABNORMAL HIGH (ref 17.9–39.5)
TIBC: 256 ug/dL (ref 250–450)

## 2019-12-31 LAB — HEMOGLOBIN AND HEMATOCRIT, BLOOD
HCT: 21.3 % — ABNORMAL LOW (ref 39.0–52.0)
Hemoglobin: 6.8 g/dL — CL (ref 13.0–17.0)

## 2019-12-31 LAB — FERRITIN: Ferritin: 386 ng/mL — ABNORMAL HIGH (ref 24–336)

## 2019-12-31 LAB — VITAMIN B12: Vitamin B-12: 263 pg/mL (ref 180–914)

## 2019-12-31 LAB — HEPARIN LEVEL (UNFRACTIONATED): Heparin Unfractionated: 0.1 IU/mL — ABNORMAL LOW (ref 0.30–0.70)

## 2019-12-31 LAB — PREPARE RBC (CROSSMATCH)

## 2019-12-31 SURGERY — ARTERIOVENOUS (AV) FISTULA CREATION
Anesthesia: Regional | Site: Arm Lower | Laterality: Left

## 2019-12-31 MED ORDER — FENTANYL CITRATE (PF) 100 MCG/2ML IJ SOLN: 50.0000 ug | Freq: Once | INTRAMUSCULAR | Status: DC

## 2019-12-31 MED ORDER — FENTANYL CITRATE (PF) 100 MCG/2ML IJ SOLN
INTRAMUSCULAR | Status: AC
  Administered 2019-12-31: 25 ug via INTRAVENOUS
  Filled 2019-12-31: qty 2

## 2019-12-31 MED ORDER — FENTANYL CITRATE (PF) 250 MCG/5ML IJ SOLN
INTRAMUSCULAR | Status: AC
  Filled 2019-12-31: qty 5

## 2019-12-31 MED ORDER — LIDOCAINE-EPINEPHRINE (PF) 1.5 %-1:200000 IJ SOLN
INTRAMUSCULAR | Status: DC | PRN
Start: 2019-12-31 — End: 2019-12-31
  Administered 2019-12-31: 30 mL via PERINEURAL

## 2019-12-31 MED ORDER — SODIUM CHLORIDE 0.9 % IV SOLN
INTRAVENOUS | Status: AC
  Filled 2019-12-31: qty 1.2

## 2019-12-31 MED ORDER — LIDOCAINE-EPINEPHRINE (PF) 1 %-1:200000 IJ SOLN
INTRAMUSCULAR | Status: AC
  Filled 2019-12-31: qty 30

## 2019-12-31 MED ORDER — HEPARIN BOLUS VIA INFUSION
1000.0000 [IU] | Freq: Once | INTRAVENOUS | Status: AC
  Administered 2019-12-31: 1000 [IU] via INTRAVENOUS
  Filled 2019-12-31: qty 1000

## 2019-12-31 MED ORDER — CHLORHEXIDINE GLUCONATE 0.12 % MT SOLN: 15.0000 mL | Freq: Once | OROMUCOSAL | Status: AC

## 2019-12-31 MED ORDER — SODIUM CHLORIDE 0.9% IV SOLUTION: Freq: Once | INTRAVENOUS | Status: DC

## 2019-12-31 MED ORDER — FENTANYL CITRATE (PF) 100 MCG/2ML IJ SOLN: 25.0000 ug | INTRAMUSCULAR | Status: DC | PRN

## 2019-12-31 MED ORDER — ORAL CARE MOUTH RINSE: 15.0000 mL | Freq: Once | OROMUCOSAL | Status: AC

## 2019-12-31 MED ORDER — LACTATED RINGERS IV SOLN: INTRAVENOUS | Status: DC

## 2019-12-31 MED ORDER — HEMOSTATIC AGENTS (NO CHARGE) OPTIME
TOPICAL | Status: DC | PRN
  Administered 2019-12-31: 1 via TOPICAL

## 2019-12-31 MED ORDER — MIDAZOLAM HCL 2 MG/2ML IJ SOLN
INTRAMUSCULAR | Status: AC
  Filled 2019-12-31: qty 2

## 2019-12-31 MED ORDER — 0.9 % SODIUM CHLORIDE (POUR BTL) OPTIME
TOPICAL | Status: DC | PRN
  Administered 2019-12-31: 1000 mL

## 2019-12-31 MED ORDER — ACETAMINOPHEN 500 MG PO TABS: 1000.0000 mg | ORAL_TABLET | Freq: Once | ORAL | Status: DC

## 2019-12-31 MED ORDER — CHLORHEXIDINE GLUCONATE 0.12 % MT SOLN
OROMUCOSAL | Status: AC
  Administered 2019-12-31: 15 mL via OROMUCOSAL
  Filled 2019-12-31: qty 15

## 2019-12-31 MED ORDER — SODIUM CHLORIDE 0.9 % IV SOLN
INTRAVENOUS | Status: DC | PRN
  Administered 2019-12-31: 500 mL

## 2019-12-31 MED ORDER — PROPOFOL 10 MG/ML IV BOLUS
INTRAVENOUS | Status: AC
  Filled 2019-12-31: qty 20

## 2019-12-31 MED ORDER — FENTANYL CITRATE (PF) 100 MCG/2ML IJ SOLN: 25.0000 ug | Freq: Once | INTRAMUSCULAR | Status: AC

## 2019-12-31 SURGICAL SUPPLY — 31 items
ADH SKN CLS APL DERMABOND .7 (GAUZE/BANDAGES/DRESSINGS) ×1
ARMBAND PINK RESTRICT EXTREMIT (MISCELLANEOUS) ×4 IMPLANT
CANISTER SUCT 3000ML PPV (MISCELLANEOUS) ×2 IMPLANT
CLIP VESOCCLUDE MED 6/CT (CLIP) ×2 IMPLANT
CLIP VESOCCLUDE SM WIDE 6/CT (CLIP) ×2 IMPLANT
COVER PROBE W GEL 5X96 (DRAPES) ×2 IMPLANT
COVER WAND RF STERILE (DRAPES) ×2 IMPLANT
DERMABOND ADVANCED (GAUZE/BANDAGES/DRESSINGS) ×1
DERMABOND ADVANCED .7 DNX12 (GAUZE/BANDAGES/DRESSINGS) ×1 IMPLANT
ELECT REM PT RETURN 9FT ADLT (ELECTROSURGICAL) ×2
ELECTRODE REM PT RTRN 9FT ADLT (ELECTROSURGICAL) ×1 IMPLANT
GLOVE BIOGEL PI IND STRL 7.5 (GLOVE) ×1 IMPLANT
GLOVE BIOGEL PI INDICATOR 7.5 (GLOVE) ×1
GLOVE SURG SS PI 7.5 STRL IVOR (GLOVE) ×2 IMPLANT
GOWN STRL REUS W/ TWL LRG LVL3 (GOWN DISPOSABLE) ×2 IMPLANT
GOWN STRL REUS W/ TWL XL LVL3 (GOWN DISPOSABLE) ×1 IMPLANT
GOWN STRL REUS W/TWL LRG LVL3 (GOWN DISPOSABLE) ×4
GOWN STRL REUS W/TWL XL LVL3 (GOWN DISPOSABLE) ×2
HEMOSTAT SNOW SURGICEL 2X4 (HEMOSTASIS) ×1 IMPLANT
KIT BASIN OR (CUSTOM PROCEDURE TRAY) ×2 IMPLANT
KIT TURNOVER KIT B (KITS) ×2 IMPLANT
NS IRRIG 1000ML POUR BTL (IV SOLUTION) ×2 IMPLANT
PACK CV ACCESS (CUSTOM PROCEDURE TRAY) ×2 IMPLANT
PAD ARMBOARD 7.5X6 YLW CONV (MISCELLANEOUS) ×4 IMPLANT
SUT PROLENE 6 0 CC (SUTURE) ×2 IMPLANT
SUT VIC AB 3-0 SH 27 (SUTURE) ×2
SUT VIC AB 3-0 SH 27X BRD (SUTURE) ×1 IMPLANT
SUT VICRYL 4-0 PS2 18IN ABS (SUTURE) ×2 IMPLANT
TOWEL GREEN STERILE (TOWEL DISPOSABLE) ×2 IMPLANT
UNDERPAD 30X36 HEAVY ABSORB (UNDERPADS AND DIAPERS) ×2 IMPLANT
WATER STERILE IRR 1000ML POUR (IV SOLUTION) ×2 IMPLANT

## 2019-12-31 NOTE — Transfer of Care (Signed)
Immediate Anesthesia Transfer of Care Note  Patient: Terry Garner  Procedure(s) Performed: LEFT ARM BRACHIOCEPHALIC ARTERIOVENOUS (AV) FISTULA CREATION (Left Arm Lower)  Patient Location: PACU  Anesthesia Type:MAC combined with regional for post-op pain  Level of Consciousness: awake and patient cooperative  Airway & Oxygen Therapy: Patient Spontanous Breathing and Patient connected to nasal cannula oxygen  Post-op Assessment: Report given to RN and Post -op Vital signs reviewed and stable  Post vital signs: Reviewed and stable  Last Vitals:  Vitals Value Taken Time  BP 120/53 12/31/19 1215  Temp    Pulse 89 12/31/19 1216  Resp 12 12/31/19 1216  SpO2 98 % 12/31/19 1216  Vitals shown include unvalidated device data.  Last Pain:  Vitals:   12/31/19 0429  TempSrc: Oral  PainSc: 0-No pain         Complications: No complications documented.

## 2019-12-31 NOTE — Progress Notes (Signed)
Dr. Trula Slade made aware of patient's hemoglobin of 6.8 and order to transfuse.  Per Dr. Trula Slade, blood can be given in the OR.

## 2019-12-31 NOTE — Anesthesia Procedure Notes (Addendum)
Anesthesia Regional Block: Axillary brachial plexus block   Pre-Anesthetic Checklist: ,, timeout performed, Correct Patient, Correct Site, Correct Laterality, Correct Procedure, Correct Position, site marked, Risks and benefits discussed,  Surgical consent,  Pre-op evaluation,  At surgeon's request and post-op pain management  Laterality: Left  Prep: chloraprep       Needles:  Injection technique: Single-shot  Needle Type: Echogenic Stimulator Needle     Needle Length: 9cm  Needle Gauge: 21     Additional Needles:   Procedures:, nerve stimulator,,, ultrasound used (permanent image in chart),,,,   Nerve Stimulator or Paresthesia:  Response: median, radial, MC, ulnar, 0.5 mA,   Additional Responses:   Narrative:  Start time: 12/31/2019 10:35 AM End time: 12/31/2019 10:42 AM Injection made incrementally with aspirations every 5 mL.  Performed by: Personally  Anesthesiologist: Suzette Battiest, MD

## 2019-12-31 NOTE — Progress Notes (Addendum)
Notified [facility] MD of patient drop in hemoglobin to 7.1 from 8.2 yesterday.  Patient started on IV heparin 7/7.  No obvious signs of bleeding.  No changes in patient symptoms.   [id number] returned page and placed order for repeat lab draw with type and screen in the AM.

## 2019-12-31 NOTE — Progress Notes (Signed)
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 It was 10/10 and non-radiating and no associated symptoms.Denies recent fever, chills. He has chronic LLE which is unchanged. Denies weight gain. No worsening orthopnea or sob on exertion. He came to the ED when it persisted   ED Course:In the ED BP 137/55, pulse 64, afebrile, RR 19, 98% O2. Labs showed creatinine 3.15, BUN 58, calcium 8.2, Hgb 8.2. Albumin 2.9. AST 45, HS troponin 50>48. CXR showed small B/L pleural effusions, however small than 09/25/2017, increased opacity in the left lung base, atelectasis vs infection.EKG nonacute. In the ED he was given IV morphine and IV nitro  Subjective: He has no complaints.    Assessment & Plan:   Principal Problem:   Chest pain-  Coronary artery disease of native artery of native heart with stable angina pectoris - improved after Nitro infusion- transition to Imdur - Troponin barely elevated at 50 and 48 -cont ASA, Plavix, Metoprolol per cardiology  Active Problems:    Severe aortic stenosis - undergoing assessment for TAVR  A-fib with RVR - noted on 7/7 - cont Metoprolol  - Heparin started on 7/7 but Hb noted to be dropping and thus stopped  Anemia, normocytic - Hb > 8.0 > 7.1 > 6.8- 1 U PRBC ordered by night cover- no signs of bleeding noted - anemia panel reveals AOCD- MCV is normal- follow    CKD (chronic kidney disease) stage 4, GFR 15-29 ml/min (HCC) - vein mapping completed and  AV fistula created 7/8 -   nephrology following and assisting with management  AAA - 4 cm  - cont surveillance     Time spent in minutes: 35 DVT prophylaxis: SCDs Code Status: Full code Family Communication:  Disposition Plan:  Status is: Inpatient  Remains inpatient appropriate because:probable need for TAVR during this admission.    Dispo: The patient is from: Home              Anticipated d/c is to: Home              Anticipated d/c date is: > 3 days              Patient currently is not medically stable to d/c.      Consultants:   Cardiology  Nephrlogy  Vascular surgery Procedures:   Vein mapping   Antimicrobials:  Anti-infectives (From admission, onward)   Start     Dose/Rate Route Frequency Ordered Stop   12/31/19 0000  ceFAZolin (ANCEF) IVPB 1 g/50 mL premix       Note to Pharmacy: Send with pt to OR   1 g 100 mL/hr over 30 Minutes Intravenous On call 12/30/19 1714 12/31/19 1056       Objective: Vitals:   12/31/19 1240 12/31/19 1241 12/31/19 1242 12/31/19 1245  BP:    124/66  Pulse:  91    Resp: 18     Temp:   (!) 97 F (36.1 C)   TempSrc:      SpO2:  94%  Weight:      Height:        Intake/Output Summary (Last 24 hours) at 12/31/2019 1343 Last data filed at 12/31/2019 1250 Gross per 24 hour  Intake 2434.31 ml  Output 750 ml  Net 1684.31 ml   Filed Weights   12/29/19 0437 12/30/19 0419 12/31/19 0429  Weight: 72.8 kg 73.2 kg 75.2 kg    Examination: General exam: Appears comfortable  HEENT: PERRLA, oral mucosa moist, no sclera icterus or thrush Respiratory system: Clear to auscultation. Respiratory effort normal. Cardiovascular system: S1 & S2 heard, RRR.  2/6 Murmur at right 2nd intercostal space Gastrointestinal system: Abdomen soft, non-tender, nondistended. Normal bowel sounds. Central nervous system: Alert and oriented. No focal neurological deficits. Extremities: No cyanosis, clubbing or edema Skin: No rashes or  ulcers Psychiatry:  Mood & affect appropriate.     Data Reviewed: I have personally reviewed following labs and imaging studies  CBC: Recent Labs  Lab 12/28/19 1410 12/30/19 0818 12/31/19 0028 12/31/19 0524  WBC 8.6 6.8 5.7  --   HGB 8.2* 8.0* 7.1* 6.8*  HCT 26.7* 25.0* 21.9* 21.3*  MCV 97.8 96.9 94.8  --   PLT 147* 144* 139*  --    Basic Metabolic Panel: Recent Labs  Lab 12/28/19 1410 12/30/19 0818 12/31/19 0028  NA 138 137 131*  K 3.7 3.8 3.8  CL 106 104 102  CO2 22 23 22   GLUCOSE 150* 96 132*  BUN 58* 62* 60*  CREATININE 3.15* 2.99* 2.99*  CALCIUM 8.2* 8.4* 7.7*  PHOS  --  3.9 3.5   GFR: Estimated Creatinine Clearance: 20.6 mL/min (A) (by C-G formula based on SCr of 2.99 mg/dL (H)). Liver Function Tests: Recent Labs  Lab 12/28/19 1457 12/30/19 0818 12/31/19 0028  AST 45*  --   --   ALT 18  --   --   ALKPHOS 41  --   --   BILITOT 0.8  --   --   PROT 5.1*  --   --   ALBUMIN 2.9* 2.5* 2.1*   Recent Labs  Lab 12/28/19 1457  LIPASE 41   No results for input(s): AMMONIA in the last 168 hours. Coagulation Profile: No results for input(s): INR, PROTIME in the last 168 hours. Cardiac Enzymes: No results for input(s): CKTOTAL, CKMB, CKMBINDEX, TROPONINI in the last 168 hours. BNP (last 3 results) No results for input(s): PROBNP in the last 8760 hours. HbA1C: No results for input(s): HGBA1C in the last 72 hours. CBG: No results for input(s): GLUCAP in the last 168 hours. Lipid Profile: Recent Labs    12/29/19 0412  CHOL 81  HDL 20*  LDLCALC 46  TRIG 75  CHOLHDL 4.1   Thyroid Function Tests: No results for input(s): TSH, T4TOTAL, FREET4, T3FREE, THYROIDAB in the last 72 hours. Anemia Panel: Recent Labs    12/30/19 0818  FERRITIN 289  TIBC 291  IRON 25*   Urine analysis:    Component Value Date/Time   COLORURINE YELLOW 12/20/2016 0924   APPEARANCEUR CLEAR 12/20/2016 0924   LABSPEC 1.013 12/20/2016 0924   PHURINE 7.0 12/20/2016 0924    GLUCOSEU NEGATIVE 12/20/2016 0924   HGBUR NEGATIVE 12/20/2016 0924   BILIRUBINUR NEGATIVE 12/20/2016 0924   KETONESUR NEGATIVE 12/20/2016 0924   PROTEINUR NEGATIVE 12/20/2016 0924   UROBILINOGEN 0.2 11/15/2014 2052   NITRITE NEGATIVE 12/20/2016 0924   LEUKOCYTESUR NEGATIVE 12/20/2016 0924   Sepsis Labs: @LABRCNTIP (procalcitonin:4,lacticidven:4) ) Recent Results (from the past 240 hour(s))  SARS Coronavirus 2  by RT PCR (hospital order, performed in [facility] hospital lab) Nasopharyngeal Nasopharyngeal Swab     Status: None   Collection Time: 12/28/19  7:09 PM   Specimen: Nasopharyngeal Swab  Result Value Ref Range Status   SARS Coronavirus 2 NEGATIVE NEGATIVE Final    Comment: (NOTE) SARS-CoV-2 target nucleic acids are NOT DETECTED.  The SARS-CoV-2 RNA is generally detectable in upper and lower respiratory specimens during the acute phase of infection. The lowest concentration of SARS-CoV-2 viral copies this assay can detect is 250 copies / mL. A negative result does not preclude SARS-CoV-2 infection and should not be used as the sole basis for treatment or other patient management decisions.  A negative result may occur with improper specimen collection / handling, submission of specimen other than nasopharyngeal swab, presence of viral mutation(s) within the areas targeted by this assay, and inadequate number of viral copies (<250 copies / mL). A negative result must be combined with clinical observations, patient history, and epidemiological information.  Fact Sheet for Patients:   StrictlyIdeas.no  Fact Sheet for Healthcare Providers: BankingDealers.co.za  This test is not yet approved or  cleared by the [place] FDA and has been authorized for detection and/or diagnosis of SARS-CoV-2 by FDA under an Emergency Use Authorization (EUA).  This EUA will remain in effect (meaning this test can be used) for the duration of  the COVID-19 declaration under Section 564(b)(1) of the Act, 21 U.S.C. section 360bbb-3(b)(1), unless the authorization is terminated or revoked sooner.  Performed at [facility], [place] [address] [place], [place] [address]   Surgical pcr screen     Status: None   Collection Time: 12/30/19 10:41 PM   Specimen: Nasal Mucosa; Nasal Swab  Result Value Ref Range Status   MRSA, PCR NEGATIVE NEGATIVE Final   Staphylococcus aureus NEGATIVE NEGATIVE Final    Comment: (NOTE) The Xpert SA Assay (FDA approved for NASAL specimens in patients 89 years of age and older), is one component of a comprehensive surveillance program. It is not intended to diagnose infection nor to guide or monitor treatment. Performed at [facility], [place] [address] [place], [place] [address]          Radiology Studies: DG Orthopantogram  Result Date: 12/30/2019 CLINICAL DATA:  Upcoming heart surgery EXAM: ORTHOPANTOGRAM/PANORAMIC COMPARISON:  None. FINDINGS: Extensive dental hardware is noted. No definitive periapical lucencies are identified to suggest abscess formation. No significant acute dental caries are noted at this time. No bony abnormality is seen. IMPRESSION: Changes of prior dental hardware. No acute care ease or periapical abscesses are seen Electronically Signed   By: Inez Catalina M.D.   On: 12/30/2019 21:15   US AORTA  Result Date: 12/30/2019 CLINICAL DATA:  Known abdominal aortic aneurysm EXAM: ULTRASOUND OF ABDOMINAL AORTA TECHNIQUE: Ultrasound examination of the abdominal aorta and proximal common iliac arteries was performed to evaluate for aneurysm. Additional color and Doppler images of the distal aorta were obtained to document patency. COMPARISON:  CT angiography 12/30/2019 FINDINGS: Abdominal aortic measurements as follows: Proximal:  3.0 x 3.0 cm Mid:  3.0 x 3.1 cm Distal:  3.8 x 4.1 cm Patent: Yes, peak systolic velocity is 86.7 cm/s Right common iliac artery: 0.8 x 1.1 cm Left  common iliac artery: 1.0 x 1.0 cm IMPRESSION: Redemonstration of the infrarenal abdominal aortic aneurysm measuring up to 4.1 cm in maximal diameter, much better characterized on the same day CT angiography of the chest, abdomen and pelvis. Electronically  Signed   By: Lovena Le M.D.   On: 12/30/2019 21:01   CT CORONARY MORPH W/CTA COR W/SCORE W/CA W/CM &/OR WO/CM  Addendum Date: 12/31/2019   ADDENDUM REPORT: 12/31/2019 07:56 ADDENDUM: Extracardiac findings will be described separately under dictation for contemporaneously obtained CTA chest, abdomen and pelvis dated 12/30/2019. Please see that report for full description of relevant extracardiac findings. Electronically Signed   By: Vinnie Langton M.D.   On: 12/31/2019 07:56   Result Date: 12/31/2019 CLINICAL DATA:  79 year old male with severe aortic stenosis being evaluated for a TAVR procedure. EXAM: Cardiac TAVR CT TECHNIQUE: The patient was scanned on a [employer]. A 120 kV retrospective scan was triggered in the descending thoracic aorta at 111 HU's. Gantry rotation speed was 250 msecs and collimation was .6 mm. No beta blockade or nitro were given. The 3D data set was reconstructed in 5% intervals of the R-R cycle. Systolic and diastolic phases were analyzed on a dedicated work station using MPR, MIP and VRT modes. The patient received 80 cc of contrast. FINDINGS: Aortic Valve: Trileaflet aortic valve with moderately calcified leaflets and moderately restricted leaflet opening. Only minimal calcifications are extending into the LVOT. Aortic valve calcium score is 1745. Aorta: Normal caliber with moderate diffuse atherosclerotic plaque and calcifications. No dissection. Sinotubular Junction: 30 x 28 mm Ascending Thoracic Aorta: 33 x 32 mm Aortic Arch: 28 x 26 mm Descending Thoracic Aorta: 27 x 25 mm Sinus of Valsalva Measurements: Non-coronary: 33 mm Right -coronary: 31 mm Left -coronary: 34 mm Coronary Artery Height above Annulus: Left  Main: 14 mm Right Coronary: 15 mm Virtual Basal Annulus Measurements: Maximum/Minimum Diameter: 26.3 x 22.6 mm Mean Diameter: 24.2 mm Perimeter: 77.7 mm Area: 460 mm2 Optimum Fluoroscopic Angle for Delivery:  LAO 6 CAU 6 IMPRESSION: 1. Trileaflet aortic valve with moderately calcified leaflets and moderately restricted leaflet opening. Only minimal calcifications are extending into the LVOT. Aortic valve calcium score is 1745. Annular measurements are suitable for delivery of a 26 mm Edwards-SAPIEN 3 Ultra valve. 2. Sufficient coronary to annulus distance. 3. Optimum Fluoroscopic Angle for Delivery: LAO 6 CAU 6. 4. No thrombus in the left atrial appendage. 5. Dilated pulmonary artery measuring 35 mm. Electronically Signed: By: Ena Dawley On: 12/30/2019 20:24   CT ANGIO CHEST AORTA W/CM & OR WO/CM  Result Date: 12/31/2019 CLINICAL DATA:  79 year old male with history of severe aortic stenosis. Preprocedural evaluation prior to potential transcatheter aortic valve replacement (TAVR). EXAM: CT ANGIOGRAPHY CHEST, ABDOMEN AND PELVIS TECHNIQUE: Non-contrast CT of the chest was initially obtained. Multidetector CT imaging through the chest, abdomen and pelvis was performed using the standard protocol during bolus administration of intravenous contrast. Multiplanar reconstructed images and MIPs were obtained and reviewed to evaluate the vascular anatomy. CONTRAST:  123mL OMNIPAQUE IOHEXOL 350 MG/ML SOLN COMPARISON:  CT the abdomen and pelvis 10/29/2017. Chest CTA 06/07/2009. FINDINGS: CTA CHEST FINDINGS Cardiovascular: Heart size is mildly enlarged. Small amount of pericardial fluid and/or thickening. No pericardial calcifications. There is aortic atherosclerosis, as well as atherosclerosis of the great vessels of the mediastinum and the coronary arteries, including calcified atherosclerotic plaque in the left main, left anterior descending, left circumflex and right coronary arteries. Severe thickening  calcification of the aortic valve. Calcifications of the mitral annulus. Mediastinum/Lymph Nodes: No pathologically enlarged mediastinal or hilar lymph nodes. Esophagus is unremarkable in appearance. No axillary lymphadenopathy. Lungs/Pleura: Moderate bilateral pleural effusions. Left pleural effusion is completely simple lying dependently. Right pleural effusion is  predominantly dependently located, although there is a small loculated component in the periphery of the anterolateral right hemithorax (axial image 69 of series 4). These are associated with areas of passive subsegmental atelectasis in the lower lobes of the lungs bilaterally. No acute consolidative airspace disease. No suspicious appearing pulmonary nodules or masses are noted. Musculoskeletal/Soft Tissues: There are no aggressive appearing lytic or blastic lesions noted in the visualized portions of the skeleton. CTA ABDOMEN AND PELVIS FINDINGS Hepatobiliary: Liver has a slightly shrunken appearance and nodular contour, suggesting underlying cirrhosis. Two somewhat ill-defined areas of hypervascularity are noted in the central aspect of the liver, one adjacent to the gallbladder fossa (axial image 112 of series 4) measuring 1.6 x 0.9 cm, and the other in segment 4A of the liver (axial image 97 of series 4) measuring 1.4 x 1.2 cm. No intra or extrahepatic biliary ductal dilatation. Status post cholecystectomy. Pancreas: No pancreatic mass. No pancreatic ductal dilatation. No well-defined pancreatic or peripancreatic fluid collections to suggest pseudocyst. Spleen: Unremarkable. Adrenals/Urinary Tract: Bilateral kidneys and adrenal glands are normal in appearance. No hydroureteronephrosis. Urinary bladder is partially obscured by beam hardening artifact from the patient's left hip arthroplasty, but visualized portions are unremarkable. Stomach/Bowel: Fluid adjacent to the greater curvature of the proximal stomach, presumably some fluid tracking in the  gastrosplenic ligament. Otherwise, stomach is unremarkable in appearance. No pathologic dilatation of small bowel or colon. Several small bowel diverticulae are noted in the duodenum and proximal jejunum, measuring up to 2.5 x 2.1 cm in the proximal jejunum (axial image 125 of series 4), without surrounding inflammatory changes to suggest an associated diverticulitis at this time. 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 Normal appendix. Vascular/Lymphatic: Aortic atherosclerosis with vascular findings and measurements pertinent to potential TAVR procedure, as detailed below. Fusiform aneurysmal dilatation of the infrarenal abdominal aorta measuring up to 4.7 x 4.2 cm. No lymphadenopathy noted in the abdomen or pelvis. Reproductive: Prostate gland and seminal vesicles are unremarkable in appearance. Other: Small volume of ascites.  No pneumoperitoneum. Musculoskeletal: Status post PLIF from L3-L5 with interbody grafts at L3-L4 and L4-L5. Status post left hip arthroplasty. 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 There are no aggressive appearing lytic or blastic lesions noted in the visualized portions of the skeleton. VASCULAR MEASUREMENTS PERTINENT TO TAVR: AORTA: Minimal Aortic Diameter-20 x 17 mm Severity of Aortic Calcification-moderate to severe RIGHT PELVIS: Right Common Iliac Artery - Minimal Diameter-12.0 x 10.2 mm Tortuosity-mild Calcification-moderate Right External Iliac Artery - Minimal Diameter-7.0 x 7.4 mm Tortuosity-moderate Calcification-mild Right Common Femoral Artery - Minimal Diameter-7.8 x 6.6 mm Tortuosity-mild Calcification-moderate LEFT PELVIS: Left Common Iliac Artery - Minimal Diameter-10.8 x 11.3 mm Tortuosity-mild Calcification-mild Left External Iliac Artery - Minimal Diameter-7.8 x 6.6 mm  Tortuosity-moderate Calcification-none Left Common Femoral Artery - Minimal Diameter-7.0 x 7.5 mm Tortuosity-mild Calcification-moderate Review of the MIP images confirms the above findings. IMPRESSION: 1. Vascular findings and measurements pertinent to potential TAVR procedure, as detailed above. 2. Severe thickening calcification of the aortic valve, compatible with reported clinical history of severe aortic stenosis. 3. Aortic atherosclerosis, in addition to left main and 3 vessel coronary artery disease. There is also fusiform aneurysmal dilatation of the infrarenal abdominal aorta which measures up to 4.7 x 4.2 cm in diameter. Recommend followup by abdomen and pelvis CTA in 6 months, and vascular surgery referral/consultation if not already obtained.  This recommendation follows ACR consensus guidelines: White Paper of the ACR Incidental Findings Committee II on Vascular Findings. J Am Coll Radiol 2013; 10:789-794. Aortic aneurysm NOS (ICD10-I71.9). 4. Mild cardiomegaly. 5. Moderate bilateral pleural effusions with partial loculation of a small portion of the right pleural effusion. 6. Morphologic changes in the liver suggestive of underlying cirrhosis with 2 ill-defined areas of hypervascularity. These may simply represent benign perfusion anomalies, however, given the cirrhotic changes, further evaluation with nonemergent abdominal MRI with and without IV gadolinium is strongly recommended in the near future to exclude underlying hepatocellular carcinoma. Correlation with AFP levels is also recommended. 7. Small volume of ascites. 8. Colonic and small bowel diverticulosis without evidence of acute diverticulitis at this time. 9. Additional incidental findings, as above. Electronically Signed   By: Vinnie Langton M.D.   On: 12/31/2019 08:23   VAS US CAROTID  Result Date: 12/30/2019 Carotid Arterial Duplex Study Indications:      Pre- TAVR. Risk Factors:     Hypertension, hyperlipidemia. Comparison Study:  09-04-2018 Prior carotid duplex showed RT ICA 1-39%, LT ICA                   1-39% Performing Technologist: HODGE, RACHEL, M  Examination Guidelines: A complete evaluation includes B-mode imaging, spectral Doppler, color Doppler, and power Doppler as needed of all accessible portions of each vessel. Bilateral testing is considered an integral part of a complete examination. Limited examinations for reoccurring indications may be performed as noted.  Right Carotid Findings: +----------+--------+--------+--------+--------------------------+--------+           PSV cm/sEDV cm/sStenosisPlaque Description        Comments +----------+--------+--------+--------+--------------------------+--------+ CCA Prox  71      11              heterogenous                       +----------+--------+--------+--------+--------------------------+--------+ CCA Distal97      14              heterogenous                       +----------+--------+--------+--------+--------------------------+--------+ ICA Prox  138     27      1-39%   heterogenous and irregular         +----------+--------+--------+--------+--------------------------+--------+ ICA Distal100     28                                                 +----------+--------+--------+--------+--------------------------+--------+ ECA       190             >50%                                       +----------+--------+--------+--------+--------------------------+--------+ +----------+--------+-------+----------------+-------------------+           PSV cm/sEDV cmsDescribe        Arm Pressure (mmHG) +----------+--------+-------+----------------+-------------------+ [id number]             Multiphasic, WNL                    +----------+--------+-------+----------------+-------------------+ +---------+--------+--+--------+--+---------+ VertebralPSV cm/s62EDV cm/s15Antegrade +---------+--------+--+--------+--+---------+  Left  Carotid Findings: +----------+-------+-------+--------+------------------------+-----------------+  PSV    EDV    StenosisPlaque Description      Comments                    cm/s   cm/s                                                     +----------+-------+-------+--------+------------------------+-----------------+ CCA Prox  93     15             heterogenous                              +----------+-------+-------+--------+------------------------+-----------------+ CCA Distal88     13             heterogenous            intimal                                                                   thickening        +----------+-------+-------+--------+------------------------+-----------------+ ICA Prox  98     20     1-39%   calcific and irregular                    +----------+-------+-------+--------+------------------------+-----------------+ ICA Distal67     22                                                       +----------+-------+-------+--------+------------------------+-----------------+ ECA       220           >50%    heterogenous and                                                          irregular                                 +----------+-------+-------+--------+------------------------+-----------------+ +----------+--------+--------+----------------+-------------------+           PSV cm/sEDV cm/sDescribe        Arm Pressure (mmHG) +----------+--------+--------+----------------+-------------------+ [id number]              Multiphasic, WNL                    +----------+--------+--------+----------------+-------------------+ +---------+--------+--+--------+--+---------+ VertebralPSV cm/s44EDV cm/s13Antegrade +---------+--------+--+--------+--+---------+   Summary: Right Carotid: Velocities in the right ICA are consistent with a 1-39% stenosis.                Non-hemodynamically significant plaque <50% noted  in the CCA. The                ECA appears >50% stenosed. Left Carotid: Velocities in the left ICA  are consistent with a 1-39% stenosis.               Non-hemodynamically significant plaque <50% noted in the CCA. The               ECA appears >50% stenosed. Vertebrals:  Bilateral vertebral arteries demonstrate antegrade flow. Subclavians: Normal flow hemodynamics were seen in bilateral subclavian              arteries. *See table(s) above for measurements and observations.  Electronically signed by Harold Barban MD on 12/30/2019 at 5:10:03 PM.    Final    VAS [place] UPPER EXT VEIN MAPPING (PRE-OP AVF)  Result Date: 12/30/2019 UPPER EXTREMITY VEIN MAPPING  Indications: Pre-access. Comparison Study: No prior studies. Performing Technologist: Darlin Coco, M  Examination Guidelines: A complete evaluation includes B-mode imaging, spectral Doppler, color Doppler, and power Doppler as needed of all accessible portions of each vessel. Bilateral testing is considered an integral part of a complete examination. Limited examinations for reoccurring indications may be performed as noted. +-----------------+-------------+----------+---------+ Right Cephalic   Diameter (cm)Depth (cm)Findings  +-----------------+-------------+----------+---------+ Shoulder             0.23        0.49             +-----------------+-------------+----------+---------+ Prox upper arm       0.26        0.58             +-----------------+-------------+----------+---------+ Mid upper arm        0.23        0.21             +-----------------+-------------+----------+---------+ Dist upper arm       0.23        0.24             +-----------------+-------------+----------+---------+ Antecubital fossa    0.29        0.34   branching +-----------------+-------------+----------+---------+ Prox forearm         0.26        0.27   branching +-----------------+-------------+----------+---------+ Mid forearm          0.16         0.32             +-----------------+-------------+----------+---------+ Dist forearm         0.18        0.28             +-----------------+-------------+----------+---------+ Wrist                0.16        0.29             +-----------------+-------------+----------+---------+ +-----------------+-------------+----------+--------------+ Right Basilic    Diameter (cm)Depth (cm)   Findings    +-----------------+-------------+----------+--------------+ Shoulder             0.74                              +-----------------+-------------+----------+--------------+ Prox upper arm       0.49                              +-----------------+-------------+----------+--------------+ Mid upper arm        0.33                 branching    +-----------------+-------------+----------+--------------+ Dist upper arm  0.28                              +-----------------+-------------+----------+--------------+ Antecubital fossa    0.19                 branching    +-----------------+-------------+----------+--------------+ Prox forearm                            not visualized +-----------------+-------------+----------+--------------+ Mid forearm                             not visualized +-----------------+-------------+----------+--------------+ Distal forearm                          not visualized +-----------------+-------------+----------+--------------+ Wrist                                   not visualized +-----------------+-------------+----------+--------------+ +-----------------+-------------+----------+--------+ Left Cephalic    Diameter (cm)Depth (cm)Findings +-----------------+-------------+----------+--------+ Shoulder             0.34        0.45            +-----------------+-------------+----------+--------+ Prox upper arm       0.35        0.36            +-----------------+-------------+----------+--------+ Mid upper  arm        0.33        0.28            +-----------------+-------------+----------+--------+ Dist upper arm       0.33        0.36            +-----------------+-------------+----------+--------+ Antecubital fossa    0.38        0.32            +-----------------+-------------+----------+--------+ Prox forearm         0.23        0.72            +-----------------+-------------+----------+--------+ Mid forearm          0.22        0.30            +-----------------+-------------+----------+--------+ Dist forearm         0.20        0.36            +-----------------+-------------+----------+--------+ Wrist                0.16        0.26            +-----------------+-------------+----------+--------+ +-----------------+-------------+----------+--------------+ Left Basilic     Diameter (cm)Depth (cm)   Findings    +-----------------+-------------+----------+--------------+ Prox upper arm       0.26                              +-----------------+-------------+----------+--------------+ Mid upper arm        0.23                              +-----------------+-------------+----------+--------------+ Dist upper arm       0.30                              +-----------------+-------------+----------+--------------+  Antecubital fossa    0.21                 branching    +-----------------+-------------+----------+--------------+ Prox forearm                            not visualized +-----------------+-------------+----------+--------------+ Mid forearm                             not visualized +-----------------+-------------+----------+--------------+ Distal forearm                          not visualized +-----------------+-------------+----------+--------------+ Wrist                                   not visualized +-----------------+-------------+----------+--------------+ *See table(s) above for measurements and observations.   Diagnosing physician: Harold Barban MD Electronically signed by Harold Barban MD on 12/30/2019 at 5:09:24 PM.    Final    CT Angio Abd/Pel w/ and/or w/o  Result Date: 12/31/2019 CLINICAL DATA:  79 year old male with history of severe aortic stenosis. Preprocedural evaluation prior to potential transcatheter aortic valve replacement (TAVR). EXAM: CT ANGIOGRAPHY CHEST, ABDOMEN AND PELVIS TECHNIQUE: Non-contrast CT of the chest was initially obtained. Multidetector CT imaging through the chest, abdomen and pelvis was performed using the standard protocol during bolus administration of intravenous contrast. Multiplanar reconstructed images and MIPs were obtained and reviewed to evaluate the vascular anatomy. CONTRAST:  1107mL OMNIPAQUE IOHEXOL 350 MG/ML SOLN COMPARISON:  CT the abdomen and pelvis 10/29/2017. Chest CTA 06/07/2009. FINDINGS: CTA CHEST FINDINGS Cardiovascular: Heart size is mildly enlarged. Small amount of pericardial fluid and/or thickening. No pericardial calcifications. There is aortic atherosclerosis, as well as atherosclerosis of the great vessels of the mediastinum and the coronary arteries, including calcified atherosclerotic plaque in the left main, left anterior descending, left circumflex and right coronary arteries. Severe thickening calcification of the aortic valve. Calcifications of the mitral annulus. Mediastinum/Lymph Nodes: No pathologically enlarged mediastinal or hilar lymph nodes. Esophagus is unremarkable in appearance. No axillary lymphadenopathy. Lungs/Pleura: Moderate bilateral pleural effusions. Left pleural effusion is completely simple lying dependently. Right pleural effusion is predominantly dependently located, although there is a small loculated component in the periphery of the anterolateral right hemithorax (axial image 69 of series 4). These are associated with areas of passive subsegmental atelectasis in the lower lobes of the lungs bilaterally. No acute consolidative  airspace disease. No suspicious appearing pulmonary nodules or masses are noted. Musculoskeletal/Soft Tissues: There are no aggressive appearing lytic or blastic lesions noted in the visualized portions of the skeleton. CTA ABDOMEN AND PELVIS FINDINGS Hepatobiliary: Liver has a slightly shrunken appearance and nodular contour, suggesting underlying cirrhosis. Two somewhat ill-defined areas of hypervascularity are noted in the central aspect of the liver, one adjacent to the gallbladder fossa (axial image 112 of series 4) measuring 1.6 x 0.9 cm, and the other in segment 4A of the liver (axial image 97 of series 4) measuring 1.4 x 1.2 cm. No intra or extrahepatic biliary ductal dilatation. Status post cholecystectomy. Pancreas: No pancreatic mass. No pancreatic ductal dilatation. No well-defined pancreatic or peripancreatic fluid collections to suggest pseudocyst. Spleen: Unremarkable. Adrenals/Urinary Tract: Bilateral kidneys and adrenal glands are normal in appearance. No hydroureteronephrosis. Urinary bladder is partially obscured by beam hardening artifact from the patient's left hip arthroplasty, but  visualized portions are unremarkable. Stomach/Bowel: Fluid adjacent to the greater curvature of the proximal stomach, presumably some fluid tracking in the gastrosplenic ligament. Otherwise, stomach is unremarkable in appearance. No pathologic dilatation of small bowel or colon. Several small bowel diverticulae are noted in the duodenum and proximal jejunum, measuring up to 2.5 x 2.1 cm in the proximal jejunum (axial image 125 of series 4), without surrounding inflammatory changes to suggest an associated diverticulitis at this time. 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 Normal appendix. Vascular/Lymphatic: Aortic atherosclerosis with vascular findings and measurements pertinent to potential TAVR procedure, as detailed  below. Fusiform aneurysmal dilatation of the infrarenal abdominal aorta measuring up to 4.7 x 4.2 cm. No lymphadenopathy noted in the abdomen or pelvis. Reproductive: Prostate gland and seminal vesicles are unremarkable in appearance. Other: Small volume of ascites.  No pneumoperitoneum. Musculoskeletal: Status post PLIF from L3-L5 with interbody grafts at L3-L4 and L4-L5. Status post left hip arthroplasty. 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 There are no aggressive appearing lytic or blastic lesions noted in the visualized portions of the skeleton. VASCULAR MEASUREMENTS PERTINENT TO TAVR: AORTA: Minimal Aortic Diameter-20 x 17 mm Severity of Aortic Calcification-moderate to severe RIGHT PELVIS: Right Common Iliac Artery - Minimal Diameter-12.0 x 10.2 mm Tortuosity-mild Calcification-moderate Right External Iliac Artery - Minimal Diameter-7.0 x 7.4 mm Tortuosity-moderate Calcification-mild Right Common Femoral Artery - Minimal Diameter-7.8 x 6.6 mm Tortuosity-mild Calcification-moderate LEFT PELVIS: Left Common Iliac Artery - Minimal Diameter-10.8 x 11.3 mm Tortuosity-mild Calcification-mild Left External Iliac Artery - Minimal Diameter-7.8 x 6.6 mm Tortuosity-moderate Calcification-none Left Common Femoral Artery - Minimal Diameter-7.0 x 7.5 mm Tortuosity-mild Calcification-moderate Review of the MIP images confirms the above findings. IMPRESSION: 1. Vascular findings and measurements pertinent to potential TAVR procedure, as detailed above. 2. Severe thickening calcification of the aortic valve, compatible with reported clinical history of severe aortic stenosis. 3. Aortic atherosclerosis, in addition to left main and 3 vessel coronary artery disease. There is also fusiform aneurysmal dilatation of the infrarenal abdominal aorta which measures up to 4.7 x 4.2 cm in diameter. Recommend followup by abdomen and pelvis  CTA in 6 months, and vascular surgery referral/consultation if not already obtained. This recommendation follows ACR consensus guidelines: White Paper of the ACR Incidental Findings Committee II on Vascular Findings. J Am Coll Radiol 2013; 10:789-794. Aortic aneurysm NOS (ICD10-I71.9). 4. Mild cardiomegaly. 5. Moderate bilateral pleural effusions with partial loculation of a small portion of the right pleural effusion. 6. Morphologic changes in the liver suggestive of underlying cirrhosis with 2 ill-defined areas of hypervascularity. These may simply represent benign perfusion anomalies, however, given the cirrhotic changes, further evaluation with nonemergent abdominal MRI with and without IV gadolinium is strongly recommended in the near future to exclude underlying hepatocellular carcinoma. Correlation with AFP levels is also recommended. 7. Small volume of ascites. 8. Colonic and small bowel diverticulosis without evidence of acute diverticulitis at this time. 9. Additional incidental findings, as above. Electronically Signed   By: Vinnie Langton M.D.   On: 12/31/2019 08:23      Scheduled Meds: . sodium chloride   Intravenous Once  . allopurinol  300 mg Oral Daily  . amLODipine  10 mg Oral Daily  . aspirin EC  81 mg Oral Daily  . brimonidine  1 drop Both Eyes BID  . cholecalciferol  5,000 Units Oral Daily  .  clopidogrel  75 mg Oral Daily  . darbepoetin (ARANESP) injection - NON-DIALYSIS  60 mcg Subcutaneous Q Wed-1800  . dorzolamide  1 drop Right Eye BID  . fenofibrate  160 mg Oral Daily  . furosemide  40 mg Oral Daily  . hydrALAZINE  25 mg Oral TID  . isosorbide mononitrate  30 mg Oral BID  . metoprolol tartrate  25 mg Oral BID  . nepafenac  1 drop Right Eye QHS  . pravastatin  40 mg Oral Daily   Continuous Infusions: . sodium chloride 100 mL/hr at 12/31/19 1049  . ferumoxytol 510 mg (12/30/19 1220)  . nitroGLYCERIN 10 mcg/min (12/31/19 1049)     LOS: 2 days      Debbe Odea, MD [facility] Pager: [email or web address] 12/31/2019, 1:43 PM

## 2019-12-31 NOTE — Progress Notes (Signed)
ANTICOAGULATION CONSULT NOTE - Follow Up Consult  Pharmacy Consult for heparin Indication: atrial fibrillation  Labs: Recent Labs    12/28/19 1410 12/28/19 1410 12/28/19 1648 12/30/19 0818 12/31/19 0028  HGB 8.2*   < >  --  8.0* 7.1*  HCT 26.7*  --   --  25.0* 21.9*  PLT 147*  --   --  144* 139*  HEPARINUNFRC  --   --   --   --  0.10*  CREATININE 3.15*  --   --  2.99* 2.99*  TROPONINIHS 50*  --  48*  --   --    < > = values in this interval not displayed.    Assessment: 79yo male subtherapeutic on heparin with initial dosing for Afib; no gtt issues or signs of bleeding per RN.  Goal of Therapy:  Heparin level 0.3-0.7 units/ml   Plan:  Will give small heparin bolus of 1000 units and increase heparin gtt by 3 units/kg/hr to 1100 units/hr and check level in 8 hours.    Wynona Neat, PharmD, BCPS  12/31/2019,1:59 AM

## 2019-12-31 NOTE — Progress Notes (Addendum)
CRITICAL VALUE ALERT  Critical Value: hemoglobin 6.8  Date & Time Notied:  12/31/19 5848  Provider Notified: Cyd Silence, MD  Orders Received/Actions taken: orders placed to stop IV heparin and give one unit of blood.

## 2019-12-31 NOTE — Interval H&P Note (Signed)
History and Physical Interval Note:  12/31/2019 10:24 AM  Terry Garner  has presented today for surgery, with the diagnosis of CHRONIC KIDNEY DISEASE, STAGE 4.  The various methods of treatment have been discussed with the patient and family. After consideration of risks, benefits and other options for treatment, the patient has consented to  Procedure(s): LEFT ARM ARTERIOVENOUS (AV) FISTULA CREATION VERSUS GRAFT (Left) as a surgical intervention.  The patient's history has been reviewed, patient examined, no change in status, stable for surgery.  I have reviewed the patient's chart and labs.  Questions were answered to the patient's satisfaction.     Annamarie Major

## 2019-12-31 NOTE — Anesthesia Preprocedure Evaluation (Addendum)
Anesthesia Evaluation  Patient identified by MRN, date of birth, ID band Patient awake    Reviewed: Allergy & Precautions, NPO status , Patient's Chart, lab work & pertinent test results  Airway Mallampati: II  TM Distance: >3 FB     Dental  (+) Dental Advisory Given   Pulmonary neg pulmonary ROS,    breath sounds clear to auscultation       Cardiovascular hypertension, + angina + CAD and + Peripheral Vascular Disease  + dysrhythmias + Valvular Problems/Murmurs AS  Rhythm:Regular Rate:Normal     Neuro/Psych negative neurological ROS     GI/Hepatic negative GI ROS, Neg liver ROS,   Endo/Other  negative endocrine ROS  Renal/GU CRFRenal disease     Musculoskeletal  (+) Arthritis ,   Abdominal   Peds  Hematology  (+) anemia ,   Anesthesia Other Findings   Reproductive/Obstetrics                             Lab Results  Component Value Date   WBC 5.7 12/31/2019   HGB 6.8 (LL) 12/31/2019   HCT 21.3 (L) 12/31/2019   MCV 94.8 12/31/2019   PLT 139 (L) 12/31/2019   Lab Results  Component Value Date   CREATININE 2.99 (H) 12/31/2019   BUN 60 (H) 12/31/2019   NA 131 (L) 12/31/2019   K 3.8 12/31/2019   CL 102 12/31/2019   CO2 22 12/31/2019    Anesthesia Physical Anesthesia Plan  ASA: IV  Anesthesia Plan: Regional   Post-op Pain Management:    Induction:   PONV Risk Score and Plan: 1 and Ondansetron and Treatment may vary due to age or medical condition  Airway Management Planned: Natural Airway and Simple Face Mask  Additional Equipment: None  Intra-op Plan:   Post-operative Plan:   Informed Consent: I have reviewed the patients History and Physical, chart, labs and discussed the procedure including the risks, benefits and alternatives for the proposed anesthesia with the patient or authorized representative who has indicated his/her understanding and acceptance.        Plan Discussed with: CRNA  Anesthesia Plan Comments:       Anesthesia Quick Evaluation

## 2019-12-31 NOTE — Progress Notes (Signed)
Progress Note  Patient Name: Terry Garner Date of Encounter: 12/31/2019  [facility] Cardiologist: Quay Burow, MD   Subjective   Did well with AV fistula surgery earlier today. No angina. In rate controlled Afib.  Inpatient Medications    Scheduled Meds: . sodium chloride   Intravenous Once  . allopurinol  300 mg Oral Daily  . amLODipine  10 mg Oral Daily  . aspirin EC  81 mg Oral Daily  . brimonidine  1 drop Both Eyes BID  . cholecalciferol  5,000 Units Oral Daily  . clopidogrel  75 mg Oral Daily  . darbepoetin (ARANESP) injection - NON-DIALYSIS  60 mcg Subcutaneous Q Wed-1800  . dorzolamide  1 drop Right Eye BID  . furosemide  40 mg Oral Daily  . hydrALAZINE  25 mg Oral TID  . isosorbide mononitrate  30 mg Oral BID  . metoprolol tartrate  25 mg Oral BID  . nepafenac  1 drop Right Eye QHS  . pravastatin  40 mg Oral Daily   Continuous Infusions: . sodium chloride 100 mL/hr at 12/31/19 1425  . ferumoxytol 510 mg (12/30/19 1220)  . nitroGLYCERIN 10 mcg/min (12/31/19 1049)   PRN Meds: acetaminophen, fentaNYL (SUBLIMAZE) injection, nitroGLYCERIN, ondansetron (ZOFRAN) IV, triamcinolone cream   Vital Signs    Vitals:   12/31/19 1240 12/31/19 1241 12/31/19 1242 12/31/19 1245  BP:    124/66  Pulse:  91    Resp: 18     Temp:   (!) 97 F (36.1 C)   TempSrc:      SpO2:  94%    Weight:      Height:        Intake/Output Summary (Last 24 hours) at 12/31/2019 1755 Last data filed at 12/31/2019 1751 Gross per 24 hour  Intake 2674.31 ml  Output 1350 ml  Net 1324.31 ml   Last 3 Weights 12/31/2019 12/30/2019 12/29/2019  Weight (lbs) 165 lb 11.2 oz 161 lb 6.4 oz 160 lb 6.4 oz  Weight (kg) 75.161 kg 73.211 kg 72.757 kg      Telemetry    AFib, ventr rate 80s - Personally Reviewed  ECG    No new tracing - Personally Reviewed  Physical Exam  Smiling, comfortable GEN: No acute distress.   Neck: No JVD Cardiac: RRR, 3/6 Ao ej murmur, no diast murmurs, rubs, or  gallops.  Respiratory: Clear to auscultation bilaterally. GI: Soft, nontender, non-distended  MS: No edema; No deformity. Healthy surgical site L antecubital AVF. Neuro:  HOH. Poor vision. Psych: Normal affect   Labs    High Sensitivity Troponin:   Recent Labs  Lab 12/28/19 1410 12/28/19 1648  TROPONINIHS 50* 48*      Chemistry Recent Labs  Lab 12/28/19 1410 12/28/19 1457 12/30/19 0818 12/31/19 0028  NA 138  --  137 131*  K 3.7  --  3.8 3.8  CL 106  --  104 102  CO2 22  --  23 22  GLUCOSE 150*  --  96 132*  BUN 58*  --  62* 60*  CREATININE 3.15*  --  2.99* 2.99*  CALCIUM 8.2*  --  8.4* 7.7*  PROT  --  5.1*  --   --   ALBUMIN  --  2.9* 2.5* 2.1*  AST  --  45*  --   --   ALT  --  18  --   --   ALKPHOS  --  41  --   --   BILITOT  --  0.8  --   --   GFRNONAA 18*  --  19* 19*  GFRAA 20*  --  22* 22*  ANIONGAP 10  --  10 7     Hematology Recent Labs  Lab 12/30/19 0818 12/30/19 0818 12/31/19 0028 12/31/19 0524 12/31/19 1317  WBC 6.8  --  5.7  --  5.0  RBC 2.58*   < > 2.31*  --  2.81*  2.84*  HGB 8.0*   < > 7.1* 6.8* 8.6*  HCT 25.0*   < > 21.9* 21.3* 27.1*  MCV 96.9  --  94.8  --  96.4  MCH 31.0  --  30.7  --  30.6  MCHC 32.0  --  32.4  --  31.7  RDW 15.6*  --  15.7*  --  15.2  PLT 144*  --  139*  --  156   < > = values in this interval not displayed.    BNP Recent Labs  Lab 12/28/19 1419  BNP 1,117.2*     DDimer No results for input(s): DDIMER in the last 168 hours.   Radiology    DG Orthopantogram  Result Date: 12/30/2019 CLINICAL DATA:  Upcoming heart surgery EXAM: ORTHOPANTOGRAM/PANORAMIC COMPARISON:  None. FINDINGS: Extensive dental hardware is noted. No definitive periapical lucencies are identified to suggest abscess formation. No significant acute dental caries are noted at this time. No bony abnormality is seen. IMPRESSION: Changes of prior dental hardware. No acute care ease or periapical abscesses are seen Electronically Signed   By: Inez Catalina M.D.   On: 12/30/2019 21:15   US AORTA  Result Date: 12/30/2019 CLINICAL DATA:  Known abdominal aortic aneurysm EXAM: ULTRASOUND OF ABDOMINAL AORTA TECHNIQUE: Ultrasound examination of the abdominal aorta and proximal common iliac arteries was performed to evaluate for aneurysm. Additional color and Doppler images of the distal aorta were obtained to document patency. COMPARISON:  CT angiography 12/30/2019 FINDINGS: Abdominal aortic measurements as follows: Proximal:  3.0 x 3.0 cm Mid:  3.0 x 3.1 cm Distal:  3.8 x 4.1 cm Patent: Yes, peak systolic velocity is 72.5 cm/s Right common iliac artery: 0.8 x 1.1 cm Left common iliac artery: 1.0 x 1.0 cm IMPRESSION: Redemonstration of the infrarenal abdominal aortic aneurysm measuring up to 4.1 cm in maximal diameter, much better characterized on the same day CT angiography of the chest, abdomen and pelvis. Electronically Signed   By: Lovena Le M.D.   On: 12/30/2019 21:01   CT CORONARY MORPH W/CTA COR W/SCORE W/CA W/CM &/OR WO/CM  Addendum Date: 12/31/2019   ADDENDUM REPORT: 12/31/2019 07:56 ADDENDUM: Extracardiac findings will be described separately under dictation for contemporaneously obtained CTA chest, abdomen and pelvis dated 12/30/2019. Please see that report for full description of relevant extracardiac findings. Electronically Signed   By: Vinnie Langton M.D.   On: 12/31/2019 07:56   Result Date: 12/31/2019 CLINICAL DATA:  79 year old male with severe aortic stenosis being evaluated for a TAVR procedure. EXAM: Cardiac TAVR CT TECHNIQUE: The patient was scanned on a [employer]. A 120 kV retrospective scan was triggered in the descending thoracic aorta at 111 HU's. Gantry rotation speed was 250 msecs and collimation was .6 mm. No beta blockade or nitro were given. The 3D data set was reconstructed in 5% intervals of the R-R cycle. Systolic and diastolic phases were analyzed on a dedicated work station using MPR, MIP and VRT modes. The  patient received 80 cc of contrast. FINDINGS: Aortic Valve: Trileaflet aortic valve  with moderately calcified leaflets and moderately restricted leaflet opening. Only minimal calcifications are extending into the LVOT. Aortic valve calcium score is 1745. Aorta: Normal caliber with moderate diffuse atherosclerotic plaque and calcifications. No dissection. Sinotubular Junction: 30 x 28 mm Ascending Thoracic Aorta: 33 x 32 mm Aortic Arch: 28 x 26 mm Descending Thoracic Aorta: 27 x 25 mm Sinus of Valsalva Measurements: Non-coronary: 33 mm Right -coronary: 31 mm Left -coronary: 34 mm Coronary Artery Height above Annulus: Left Main: 14 mm Right Coronary: 15 mm Virtual Basal Annulus Measurements: Maximum/Minimum Diameter: 26.3 x 22.6 mm Mean Diameter: 24.2 mm Perimeter: 77.7 mm Area: 460 mm2 Optimum Fluoroscopic Angle for Delivery:  LAO 6 CAU 6 IMPRESSION: 1. Trileaflet aortic valve with moderately calcified leaflets and moderately restricted leaflet opening. Only minimal calcifications are extending into the LVOT. Aortic valve calcium score is 1745. Annular measurements are suitable for delivery of a 26 mm Edwards-SAPIEN 3 Ultra valve. 2. Sufficient coronary to annulus distance. 3. Optimum Fluoroscopic Angle for Delivery: LAO 6 CAU 6. 4. No thrombus in the left atrial appendage. 5. Dilated pulmonary artery measuring 35 mm. Electronically Signed: By: Ena Dawley On: 12/30/2019 20:24   CT ANGIO CHEST AORTA W/CM & OR WO/CM  Result Date: 12/31/2019 CLINICAL DATA:  79 year old male with history of severe aortic stenosis. Preprocedural evaluation prior to potential transcatheter aortic valve replacement (TAVR). EXAM: CT ANGIOGRAPHY CHEST, ABDOMEN AND PELVIS TECHNIQUE: Non-contrast CT of the chest was initially obtained. Multidetector CT imaging through the chest, abdomen and pelvis was performed using the standard protocol during bolus administration of intravenous contrast. Multiplanar reconstructed images and MIPs  were obtained and reviewed to evaluate the vascular anatomy. CONTRAST:  181mL OMNIPAQUE IOHEXOL 350 MG/ML SOLN COMPARISON:  CT the abdomen and pelvis 10/29/2017. Chest CTA 06/07/2009. FINDINGS: CTA CHEST FINDINGS Cardiovascular: Heart size is mildly enlarged. Small amount of pericardial fluid and/or thickening. No pericardial calcifications. There is aortic atherosclerosis, as well as atherosclerosis of the great vessels of the mediastinum and the coronary arteries, including calcified atherosclerotic plaque in the left main, left anterior descending, left circumflex and right coronary arteries. Severe thickening calcification of the aortic valve. Calcifications of the mitral annulus. Mediastinum/Lymph Nodes: No pathologically enlarged mediastinal or hilar lymph nodes. Esophagus is unremarkable in appearance. No axillary lymphadenopathy. Lungs/Pleura: Moderate bilateral pleural effusions. Left pleural effusion is completely simple lying dependently. Right pleural effusion is predominantly dependently located, although there is a small loculated component in the periphery of the anterolateral right hemithorax (axial image 69 of series 4). These are associated with areas of passive subsegmental atelectasis in the lower lobes of the lungs bilaterally. No acute consolidative airspace disease. No suspicious appearing pulmonary nodules or masses are noted. Musculoskeletal/Soft Tissues: There are no aggressive appearing lytic or blastic lesions noted in the visualized portions of the skeleton. CTA ABDOMEN AND PELVIS FINDINGS Hepatobiliary: Liver has a slightly shrunken appearance and nodular contour, suggesting underlying cirrhosis. Two somewhat ill-defined areas of hypervascularity are noted in the central aspect of the liver, one adjacent to the gallbladder fossa (axial image 112 of series 4) measuring 1.6 x 0.9 cm, and the other in segment 4A of the liver (axial image 97 of series 4) measuring 1.4 x 1.2 cm. No intra or  extrahepatic biliary ductal dilatation. Status post cholecystectomy. Pancreas: No pancreatic mass. No pancreatic ductal dilatation. No well-defined pancreatic or peripancreatic fluid collections to suggest pseudocyst. Spleen: Unremarkable. Adrenals/Urinary Tract: Bilateral kidneys and adrenal glands are normal in appearance. No hydroureteronephrosis. Urinary bladder  is partially obscured by beam hardening artifact from the patient's left hip arthroplasty, but visualized portions are unremarkable. Stomach/Bowel: Fluid adjacent to the greater curvature of the proximal stomach, presumably some fluid tracking in the gastrosplenic ligament. Otherwise, stomach is unremarkable in appearance. No pathologic dilatation of small bowel or colon. Several small bowel diverticulae are noted in the duodenum and proximal jejunum, measuring up to 2.5 x 2.1 cm in the proximal jejunum (axial image 125 of series 4), without surrounding inflammatory changes to suggest an associated diverticulitis at this time. 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 Normal appendix. Vascular/Lymphatic: Aortic atherosclerosis with vascular findings and measurements pertinent to potential TAVR procedure, as detailed below. Fusiform aneurysmal dilatation of the infrarenal abdominal aorta measuring up to 4.7 x 4.2 cm. No lymphadenopathy noted in the abdomen or pelvis. Reproductive: Prostate gland and seminal vesicles are unremarkable in appearance. Other: Small volume of ascites.  No pneumoperitoneum. Musculoskeletal: Status post PLIF from L3-L5 with interbody grafts at L3-L4 and L4-L5. Status post left hip arthroplasty. 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 There are no aggressive appearing lytic or blastic lesions noted in the visualized portions of the skeleton. VASCULAR  MEASUREMENTS PERTINENT TO TAVR: AORTA: Minimal Aortic Diameter-20 x 17 mm Severity of Aortic Calcification-moderate to severe RIGHT PELVIS: Right Common Iliac Artery - Minimal Diameter-12.0 x 10.2 mm Tortuosity-mild Calcification-moderate Right External Iliac Artery - Minimal Diameter-7.0 x 7.4 mm Tortuosity-moderate Calcification-mild Right Common Femoral Artery - Minimal Diameter-7.8 x 6.6 mm Tortuosity-mild Calcification-moderate LEFT PELVIS: Left Common Iliac Artery - Minimal Diameter-10.8 x 11.3 mm Tortuosity-mild Calcification-mild Left External Iliac Artery - Minimal Diameter-7.8 x 6.6 mm Tortuosity-moderate Calcification-none Left Common Femoral Artery - Minimal Diameter-7.0 x 7.5 mm Tortuosity-mild Calcification-moderate Review of the MIP images confirms the above findings. IMPRESSION: 1. Vascular findings and measurements pertinent to potential TAVR procedure, as detailed above. 2. Severe thickening calcification of the aortic valve, compatible with reported clinical history of severe aortic stenosis. 3. Aortic atherosclerosis, in addition to left main and 3 vessel coronary artery disease. There is also fusiform aneurysmal dilatation of the infrarenal abdominal aorta which measures up to 4.7 x 4.2 cm in diameter. Recommend followup by abdomen and pelvis CTA in 6 months, and vascular surgery referral/consultation if not already obtained. This recommendation follows ACR consensus guidelines: White Paper of the ACR Incidental Findings Committee II on Vascular Findings. J Am Coll Radiol 2013; 10:789-794. Aortic aneurysm NOS (ICD10-I71.9). 4. Mild cardiomegaly. 5. Moderate bilateral pleural effusions with partial loculation of a small portion of the right pleural effusion. 6. Morphologic changes in the liver suggestive of underlying cirrhosis with 2 ill-defined areas of hypervascularity. These may simply represent benign perfusion anomalies, however, given the cirrhotic changes, further evaluation with  nonemergent abdominal MRI with and without IV gadolinium is strongly recommended in the near future to exclude underlying hepatocellular carcinoma. Correlation with AFP levels is also recommended. 7. Small volume of ascites. 8. Colonic and small bowel diverticulosis without evidence of acute diverticulitis at this time. 9. Additional incidental findings, as above. Electronically Signed   By: Vinnie Langton M.D.   On: 12/31/2019 08:23   VAS US CAROTID  Result Date: 12/30/2019 Carotid Arterial Duplex Study Indications:      Pre- TAVR. Risk Factors:     Hypertension, hyperlipidemia. Comparison Study: 09-04-2018 Prior carotid duplex showed RT ICA 1-39%, LT ICA  1-39% Performing Technologist: Darlin Coco, M  Examination Guidelines: A complete evaluation includes B-mode imaging, spectral Doppler, color Doppler, and power Doppler as needed of all accessible portions of each vessel. Bilateral testing is considered an integral part of a complete examination. Limited examinations for reoccurring indications may be performed as noted.  Right Carotid Findings: +----------+--------+--------+--------+--------------------------+--------+           PSV cm/sEDV cm/sStenosisPlaque Description        Comments +----------+--------+--------+--------+--------------------------+--------+ CCA Prox  71      11              heterogenous                       +----------+--------+--------+--------+--------------------------+--------+ CCA Distal97      14              heterogenous                       +----------+--------+--------+--------+--------------------------+--------+ ICA Prox  138     27      1-39%   heterogenous and irregular         +----------+--------+--------+--------+--------------------------+--------+ ICA Distal100     28                                                 +----------+--------+--------+--------+--------------------------+--------+ ECA       190              >50%                                       +----------+--------+--------+--------+--------------------------+--------+ +----------+--------+-------+----------------+-------------------+           PSV cm/sEDV cmsDescribe        Arm Pressure (mmHG) +----------+--------+-------+----------------+-------------------+ [id number]             Multiphasic, WNL                    +----------+--------+-------+----------------+-------------------+ +---------+--------+--+--------+--+---------+ VertebralPSV cm/s62EDV cm/s15Antegrade +---------+--------+--+--------+--+---------+  Left Carotid Findings: +----------+-------+-------+--------+------------------------+-----------------+           PSV    EDV    StenosisPlaque Description      Comments                    cm/s   cm/s                                                     +----------+-------+-------+--------+------------------------+-----------------+ CCA Prox  93     15             heterogenous                              +----------+-------+-------+--------+------------------------+-----------------+ CCA Distal88     13             heterogenous            intimal  thickening        +----------+-------+-------+--------+------------------------+-----------------+ ICA Prox  98     20     1-39%   calcific and irregular                    +----------+-------+-------+--------+------------------------+-----------------+ ICA Distal67     22                                                       +----------+-------+-------+--------+------------------------+-----------------+ ECA       220           >50%    heterogenous and                                                          irregular                                 +----------+-------+-------+--------+------------------------+-----------------+  +----------+--------+--------+----------------+-------------------+           PSV cm/sEDV cm/sDescribe        Arm Pressure (mmHG) +----------+--------+--------+----------------+-------------------+ [id number]              Multiphasic, WNL                    +----------+--------+--------+----------------+-------------------+ +---------+--------+--+--------+--+---------+ VertebralPSV cm/s44EDV cm/s13Antegrade +---------+--------+--+--------+--+---------+   Summary: Right Carotid: Velocities in the right ICA are consistent with a 1-39% stenosis.                Non-hemodynamically significant plaque <50% noted in the CCA. The                ECA appears >50% stenosed. Left Carotid: Velocities in the left ICA are consistent with a 1-39% stenosis.               Non-hemodynamically significant plaque <50% noted in the CCA. The               ECA appears >50% stenosed. Vertebrals:  Bilateral vertebral arteries demonstrate antegrade flow. Subclavians: Normal flow hemodynamics were seen in bilateral subclavian              arteries. *See table(s) above for measurements and observations.  Electronically signed by Harold Barban MD on 12/30/2019 at 5:10:03 PM.    Final    VAS [place] UPPER EXT VEIN MAPPING (PRE-OP AVF)  Result Date: 12/30/2019 UPPER EXTREMITY VEIN MAPPING  Indications: Pre-access. Comparison Study: No prior studies. Performing Technologist: Darlin Coco, M  Examination Guidelines: A complete evaluation includes B-mode imaging, spectral Doppler, color Doppler, and power Doppler as needed of all accessible portions of each vessel. Bilateral testing is considered an integral part of a complete examination. Limited examinations for reoccurring indications may be performed as noted. +-----------------+-------------+----------+---------+ Right Cephalic   Diameter (cm)Depth (cm)Findings  +-----------------+-------------+----------+---------+ Shoulder             0.23        0.49              +-----------------+-------------+----------+---------+ Prox upper arm       0.26  0.58             +-----------------+-------------+----------+---------+ Mid upper arm        0.23        0.21             +-----------------+-------------+----------+---------+ Dist upper arm       0.23        0.24             +-----------------+-------------+----------+---------+ Antecubital fossa    0.29        0.34   branching +-----------------+-------------+----------+---------+ Prox forearm         0.26        0.27   branching +-----------------+-------------+----------+---------+ Mid forearm          0.16        0.32             +-----------------+-------------+----------+---------+ Dist forearm         0.18        0.28             +-----------------+-------------+----------+---------+ Wrist                0.16        0.29             +-----------------+-------------+----------+---------+ +-----------------+-------------+----------+--------------+ Right Basilic    Diameter (cm)Depth (cm)   Findings    +-----------------+-------------+----------+--------------+ Shoulder             0.74                              +-----------------+-------------+----------+--------------+ Prox upper arm       0.49                              +-----------------+-------------+----------+--------------+ Mid upper arm        0.33                 branching    +-----------------+-------------+----------+--------------+ Dist upper arm       0.28                              +-----------------+-------------+----------+--------------+ Antecubital fossa    0.19                 branching    +-----------------+-------------+----------+--------------+ Prox forearm                            not visualized +-----------------+-------------+----------+--------------+ Mid forearm                             not visualized  +-----------------+-------------+----------+--------------+ Distal forearm                          not visualized +-----------------+-------------+----------+--------------+ Wrist                                   not visualized +-----------------+-------------+----------+--------------+ +-----------------+-------------+----------+--------+ Left Cephalic    Diameter (cm)Depth (cm)Findings +-----------------+-------------+----------+--------+ Shoulder             0.34        0.45            +-----------------+-------------+----------+--------+ Prox upper  arm       0.35        0.36            +-----------------+-------------+----------+--------+ Mid upper arm        0.33        0.28            +-----------------+-------------+----------+--------+ Dist upper arm       0.33        0.36            +-----------------+-------------+----------+--------+ Antecubital fossa    0.38        0.32            +-----------------+-------------+----------+--------+ Prox forearm         0.23        0.72            +-----------------+-------------+----------+--------+ Mid forearm          0.22        0.30            +-----------------+-------------+----------+--------+ Dist forearm         0.20        0.36            +-----------------+-------------+----------+--------+ Wrist                0.16        0.26            +-----------------+-------------+----------+--------+ +-----------------+-------------+----------+--------------+ Left Basilic     Diameter (cm)Depth (cm)   Findings    +-----------------+-------------+----------+--------------+ Prox upper arm       0.26                              +-----------------+-------------+----------+--------------+ Mid upper arm        0.23                              +-----------------+-------------+----------+--------------+ Dist upper arm       0.30                               +-----------------+-------------+----------+--------------+ Antecubital fossa    0.21                 branching    +-----------------+-------------+----------+--------------+ Prox forearm                            not visualized +-----------------+-------------+----------+--------------+ Mid forearm                             not visualized +-----------------+-------------+----------+--------------+ Distal forearm                          not visualized +-----------------+-------------+----------+--------------+ Wrist                                   not visualized +-----------------+-------------+----------+--------------+ *See table(s) above for measurements and observations.  Diagnosing physician: Harold Barban MD Electronically signed by Harold Barban MD on 12/30/2019 at 5:09:24 PM.    Final    CT Angio Abd/Pel w/ and/or w/o  Result Date: 12/31/2019 CLINICAL DATA:  79 year old male with history of severe aortic stenosis. Preprocedural evaluation prior  to potential transcatheter aortic valve replacement (TAVR). EXAM: CT ANGIOGRAPHY CHEST, ABDOMEN AND PELVIS TECHNIQUE: Non-contrast CT of the chest was initially obtained. Multidetector CT imaging through the chest, abdomen and pelvis was performed using the standard protocol during bolus administration of intravenous contrast. Multiplanar reconstructed images and MIPs were obtained and reviewed to evaluate the vascular anatomy. CONTRAST:  147mL OMNIPAQUE IOHEXOL 350 MG/ML SOLN COMPARISON:  CT the abdomen and pelvis 10/29/2017. Chest CTA 06/07/2009. FINDINGS: CTA CHEST FINDINGS Cardiovascular: Heart size is mildly enlarged. Small amount of pericardial fluid and/or thickening. No pericardial calcifications. There is aortic atherosclerosis, as well as atherosclerosis of the great vessels of the mediastinum and the coronary arteries, including calcified atherosclerotic plaque in the left main, left anterior descending, left circumflex and  right coronary arteries. Severe thickening calcification of the aortic valve. Calcifications of the mitral annulus. Mediastinum/Lymph Nodes: No pathologically enlarged mediastinal or hilar lymph nodes. Esophagus is unremarkable in appearance. No axillary lymphadenopathy. Lungs/Pleura: Moderate bilateral pleural effusions. Left pleural effusion is completely simple lying dependently. Right pleural effusion is predominantly dependently located, although there is a small loculated component in the periphery of the anterolateral right hemithorax (axial image 69 of series 4). These are associated with areas of passive subsegmental atelectasis in the lower lobes of the lungs bilaterally. No acute consolidative airspace disease. No suspicious appearing pulmonary nodules or masses are noted. Musculoskeletal/Soft Tissues: There are no aggressive appearing lytic or blastic lesions noted in the visualized portions of the skeleton. CTA ABDOMEN AND PELVIS FINDINGS Hepatobiliary: Liver has a slightly shrunken appearance and nodular contour, suggesting underlying cirrhosis. Two somewhat ill-defined areas of hypervascularity are noted in the central aspect of the liver, one adjacent to the gallbladder fossa (axial image 112 of series 4) measuring 1.6 x 0.9 cm, and the other in segment 4A of the liver (axial image 97 of series 4) measuring 1.4 x 1.2 cm. No intra or extrahepatic biliary ductal dilatation. Status post cholecystectomy. Pancreas: No pancreatic mass. No pancreatic ductal dilatation. No well-defined pancreatic or peripancreatic fluid collections to suggest pseudocyst. Spleen: Unremarkable. Adrenals/Urinary Tract: Bilateral kidneys and adrenal glands are normal in appearance. No hydroureteronephrosis. Urinary bladder is partially obscured by beam hardening artifact from the patient's left hip arthroplasty, but visualized portions are unremarkable. Stomach/Bowel: Fluid adjacent to the greater curvature of the proximal  stomach, presumably some fluid tracking in the gastrosplenic ligament. Otherwise, stomach is unremarkable in appearance. No pathologic dilatation of small bowel or colon. Several small bowel diverticulae are noted in the duodenum and proximal jejunum, measuring up to 2.5 x 2.1 cm in the proximal jejunum (axial image 125 of series 4), without surrounding inflammatory changes to suggest an associated diverticulitis at this time. 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 Normal appendix. Vascular/Lymphatic: Aortic atherosclerosis with vascular findings and measurements pertinent to potential TAVR procedure, as detailed below. Fusiform aneurysmal dilatation of the infrarenal abdominal aorta measuring up to 4.7 x 4.2 cm. No lymphadenopathy noted in the abdomen or pelvis. Reproductive: Prostate gland and seminal vesicles are unremarkable in appearance. Other: Small volume of ascites.  No pneumoperitoneum. Musculoskeletal: Status post PLIF from L3-L5 with interbody grafts at L3-L4 and L4-L5. Status post left hip arthroplasty. 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 There are no aggressive appearing lytic or blastic lesions noted in the visualized portions of the skeleton. VASCULAR MEASUREMENTS PERTINENT TO TAVR: AORTA: Minimal  Aortic Diameter-20 x 17 mm Severity of Aortic Calcification-moderate to severe RIGHT PELVIS: Right Common Iliac Artery - Minimal Diameter-12.0 x 10.2 mm Tortuosity-mild Calcification-moderate Right External Iliac Artery - Minimal Diameter-7.0 x 7.4 mm Tortuosity-moderate Calcification-mild Right Common Femoral Artery - Minimal Diameter-7.8 x 6.6 mm Tortuosity-mild Calcification-moderate LEFT PELVIS: Left Common Iliac Artery - Minimal Diameter-10.8 x 11.3 mm Tortuosity-mild Calcification-mild Left External Iliac Artery -  Minimal Diameter-7.8 x 6.6 mm Tortuosity-moderate Calcification-none Left Common Femoral Artery - Minimal Diameter-7.0 x 7.5 mm Tortuosity-mild Calcification-moderate Review of the MIP images confirms the above findings. IMPRESSION: 1. Vascular findings and measurements pertinent to potential TAVR procedure, as detailed above. 2. Severe thickening calcification of the aortic valve, compatible with reported clinical history of severe aortic stenosis. 3. Aortic atherosclerosis, in addition to left main and 3 vessel coronary artery disease. There is also fusiform aneurysmal dilatation of the infrarenal abdominal aorta which measures up to 4.7 x 4.2 cm in diameter. Recommend followup by abdomen and pelvis CTA in 6 months, and vascular surgery referral/consultation if not already obtained. This recommendation follows ACR consensus guidelines: White Paper of the ACR Incidental Findings Committee II on Vascular Findings. J Am Coll Radiol 2013; 10:789-794. Aortic aneurysm NOS (ICD10-I71.9). 4. Mild cardiomegaly. 5. Moderate bilateral pleural effusions with partial loculation of a small portion of the right pleural effusion. 6. Morphologic changes in the liver suggestive of underlying cirrhosis with 2 ill-defined areas of hypervascularity. These may simply represent benign perfusion anomalies, however, given the cirrhotic changes, further evaluation with nonemergent abdominal MRI with and without IV gadolinium is strongly recommended in the near future to exclude underlying hepatocellular carcinoma. Correlation with AFP levels is also recommended. 7. Small volume of ascites. 8. Colonic and small bowel diverticulosis without evidence of acute diverticulitis at this time. 9. Additional incidental findings, as above. Electronically Signed   By: Vinnie Langton M.D.   On: 12/31/2019 08:23    Cardiac Studies   Cardiac Cath 12/17/2019: Conclusion    Previously placed Prox LAD to Mid LAD stent (unknown type) is widely  patent.  2nd Diag-1 lesion is 80% stenosed.  Previously placed 2nd Diag-2 stent (unknown type) is widely patent.  Hemodynamic findings consistent with aortic valve stenosis.  Ost LM lesion is 40% stenosed.  Dist LAD lesion is 60% stenosed.  Echo 12/09/2019: IMPRESSIONS    1. There is severe aortic stenosis, and at least moderate aortic regurgitation. Gradients across AoV partially related to regurgitation (LVOT VTI 27 cm). V max 4.2 m/s, MG 41 mmHG, AVA 0.95 cm2, DI 0.25. The aortic valve is tricuspid. Aortic valve regurgitation is moderate. Severe aortic valve stenosis. Aortic valve area, by VTI measures 0.95 cm. Aortic valve mean gradient measures 41.0 mmHg. Aortic valve Vmax measures 4.23 m/s.  2. Left ventricular ejection fraction, by estimation, is 60 to 65%. The left ventricle has normal function. The left ventricle has no regional wall motion abnormalities. There is mild concentric left ventricular hypertrophy. Left ventricular diastolic parameters are consistent with Grade I diastolic dysfunction (impaired relaxation).  3. Right ventricular systolic function is normal. The right ventricular size is normal. There is mildly elevated pulmonary artery systolic pressure. The estimated right ventricular systolic pressure is 09.3 mmHg.  4. Left atrial size was severely dilated.  5. Right atrial size was mildly dilated.  6. Moderate pericardial effusion. The pericardial effusion is circumferential. There is no evidence of cardiac tamponade.  7. The mitral valve is degenerative. Mild mitral valve regurgitation. No evidence of mitral stenosis.  8.  The inferior vena cava is normal in size with <50% respiratory variability, suggesting right atrial pressure of 8 mmHg.    Patient Profile     79 y.o. male with severe AS/moderate AI, CADwith remote PCI of the LAD and diagonal branches, hypertension, mixed hyperlipidemia, stage IV chronic kidney disease, anemia,moderate carotid  atherosclerosis, and abdominal aortic aneurysm(4.5 cm), right renal artery stenosis, preserved LVEF, admitted with angina, without evidence of ACS.  Assessment & Plan    1. GUY:QIHKV on very recent cath, only significant lesion was an 80% second diagonal artery stenosis. Recent nuclear study w fixed inferior defect, no ischemia. Minimal increase in troponin, flat pattern. Started beta blocker. On amlodipine and nitrates, weaned off IV NTG. 2. QQ:VZDGLO. Pre TAVR CTA and [place] completed.  3. VFI:EPPIRJJOACZYSAY compensated clinically and at recent cath (mean PAWP 10). He has an order for amyloid Tc8m-PYP SPECT which we can do as an inpatient if necessary. Clinical suspicion for cardiac amyloidosis is not very high. 4. CKD 4:evaluated and followed by Nephrology. Strted Fe replacement and epo for anemia. AVF placed today by Dr. Trula Slade. 5. AFib: on beta blockers for rate control. Will plan Eliquis long term (2.5 mg BID, adjusted for age and renal dysfunction). While in the hospital will use IV heparin in case he needs invasive procedures. Note severely dilated left atrium on echo. Cardioversion does not appear to be necessary since the arrhythmia is asymptomatic. Can consider DCCV at TAVR.  Potentially ready for DC tomorrow if renal function stable and OK from vascular surgery point of view.     For questions or updates, please contact [place] Please consult [email or web address] for contact info under        Signed, Sanda Klein, MD  12/31/2019, 5:55 PM

## 2019-12-31 NOTE — Anesthesia Postprocedure Evaluation (Signed)
Anesthesia Post Note  Patient: Terry Garner  Procedure(s) Performed: LEFT ARM BRACHIOCEPHALIC ARTERIOVENOUS (AV) FISTULA CREATION (Left Arm Lower)     Patient location during evaluation: PACU Anesthesia Type: Regional Level of consciousness: awake and alert Pain management: pain level controlled Vital Signs Assessment: post-procedure vital signs reviewed and stable Respiratory status: spontaneous breathing, nonlabored ventilation, respiratory function stable and patient connected to nasal cannula oxygen Cardiovascular status: stable and blood pressure returned to baseline Postop Assessment: no apparent nausea or vomiting Anesthetic complications: no   No complications documented.  Last Vitals:  Vitals:   12/31/19 1242 12/31/19 1245  BP:  124/66  Pulse:    Resp:    Temp: (!) 36.1 C   SpO2:      Last Pain:  Vitals:   12/31/19 1242  TempSrc:   PainSc: 0-No pain                 Tiajuana Amass

## 2019-12-31 NOTE — Progress Notes (Signed)
Patient ID: Terry Garner, male   DOB: 03/30/1941, 79 y.o.   MRN: [id number] [facility] Progress Note   Assessment/ Plan:   1.  Chronic kidney disease stage IV: Secondary to underlying hypertension and renovascular disease with relative stability of renal function over the past 6 years or so with creatinine ranging 2.6-3.4.  Now with plans for possible TAVR and associated work-up which places him at risk for additional renal injury.  Unimpressive urine output overnight however renal function has remained stable.  There are no acute indications for dialysis at this time and earlier today he underwent creation of a left brachiocephalic fistula by Dr. Trula Slade. 2.  Severe aortic stenosis: Ongoing work-up for possible TAVR. 3.  Anemia: Likely secondary to chronic disease, this morning status post PRBC transfusion with corresponding improvement of hemoglobin/hematocrit.  Started on intravenous iron based on his low iron saturation of 9% with ferritin of 289 long with ESA. 4.  New onset atrial fibrillation with rapid ventricular response: Currently appears to be rate controlled. 5.  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  Subjective:   Reports to be feeling fair, just got back from the operating room and had some lunch.  Denies any chest pain or shortness of breath.   Objective:   BP 124/66   Pulse 91   Temp (!) 97 F (36.1 C)   Resp 18   Ht 6' 2.02" (1.88 m)   Wt 75.2 kg   SpO2 94%   BMI 21.27 kg/m   Intake/Output Summary (Last 24 hours) at 12/31/2019 1415 Last data filed at 12/31/2019 1250 Gross per 24 hour  Intake 2434.31 ml  Output 750 ml  Net 1684.31 ml   Weight change: 1.95 kg  Physical Exam: Gen: Comfortably resting in bed, wife at bedside CVS: Pulse regular rhythm, normal rate, 3/6 HSM Resp: Anteriorly clear to auscultation, no rales/rhonchi Abd: Soft, flat,  nontender, bowel sounds normal Ext: 1+ bilateral ankle edema.  Palpable thrill over left BCF with intact surgical site  Imaging: DG Orthopantogram  Result Date: 12/30/2019 CLINICAL DATA:  Upcoming heart surgery EXAM: ORTHOPANTOGRAM/PANORAMIC COMPARISON:  None. FINDINGS: Extensive dental hardware is noted. No definitive periapical lucencies are identified to suggest abscess formation. No significant acute dental caries are noted at this time. No bony abnormality is seen. IMPRESSION: Changes of prior dental hardware. No acute care ease or periapical abscesses are seen Electronically Signed   By: Inez Catalina M.D.   On: 12/30/2019 21:15   US AORTA  Result Date: 12/30/2019 CLINICAL DATA:  Known abdominal aortic aneurysm EXAM: ULTRASOUND OF ABDOMINAL AORTA TECHNIQUE: Ultrasound examination of the abdominal aorta and proximal common iliac arteries was performed to evaluate for aneurysm. Additional color and Doppler images of the distal aorta were obtained to document patency. COMPARISON:  CT angiography 12/30/2019 FINDINGS: Abdominal aortic measurements as follows: Proximal:  3.0 x 3.0 cm Mid:  3.0 x 3.1 cm Distal:  3.8 x 4.1 cm Patent: Yes, peak systolic velocity is 68.3 cm/s Right common iliac artery: 0.8 x 1.1 cm Left common iliac artery: 1.0 x 1.0 cm IMPRESSION: Redemonstration of the infrarenal abdominal aortic aneurysm measuring up to 4.1 cm in maximal diameter, much better characterized on the same day CT angiography of the chest, abdomen and pelvis. Electronically Signed   By: Lovena Le M.D.   On: 12/30/2019 21:01   CT CORONARY MORPH W/CTA COR W/SCORE W/CA W/CM &/OR WO/CM  Addendum Date: 12/31/2019   ADDENDUM REPORT: 12/31/2019 07:56 ADDENDUM: Extracardiac findings will be described separately under dictation for contemporaneously obtained CTA chest, abdomen and pelvis dated 12/30/2019. Please see that report for full description of relevant extracardiac findings. Electronically Signed   By: Vinnie Langton M.D.   On: 12/31/2019 07:56   Result Date: 12/31/2019 CLINICAL DATA:  79 year old male with severe aortic stenosis being evaluated for a TAVR procedure. EXAM: Cardiac TAVR CT TECHNIQUE: The patient was scanned on a [employer]. A 120 kV retrospective scan was triggered in the descending thoracic aorta at 111 HU's. Gantry rotation speed was 250 msecs and collimation was .6 mm. No beta blockade or nitro were given. The 3D data set was reconstructed in 5% intervals of the R-R cycle. Systolic and diastolic phases were analyzed on a dedicated work station using MPR, MIP and VRT modes. The patient received 80 cc of contrast. FINDINGS: Aortic Valve: Trileaflet aortic valve with moderately calcified leaflets and moderately restricted leaflet opening. Only minimal calcifications are extending into the LVOT. Aortic valve calcium score is 1745. Aorta: Normal caliber with moderate diffuse atherosclerotic plaque and calcifications. No dissection. Sinotubular Junction: 30 x 28 mm Ascending Thoracic Aorta: 33 x 32 mm Aortic Arch: 28 x 26 mm Descending Thoracic Aorta: 27 x 25 mm Sinus of Valsalva Measurements: Non-coronary: 33 mm Right -coronary: 31 mm Left -coronary: 34 mm Coronary Artery Height above Annulus: Left Main: 14 mm Right Coronary: 15 mm Virtual Basal Annulus Measurements: Maximum/Minimum Diameter: 26.3 x 22.6 mm Mean Diameter: 24.2 mm Perimeter: 77.7 mm Area: 460 mm2 Optimum Fluoroscopic Angle for Delivery:  LAO 6 CAU 6 IMPRESSION: 1. Trileaflet aortic valve with moderately calcified leaflets and moderately restricted leaflet opening. Only minimal calcifications are extending into the LVOT. Aortic valve calcium score is 1745. Annular measurements are suitable for delivery of a 26 mm Edwards-SAPIEN 3 Ultra valve. 2. Sufficient coronary to annulus distance. 3. Optimum Fluoroscopic Angle for Delivery: LAO 6 CAU 6. 4. No thrombus in the left atrial appendage. 5. Dilated pulmonary artery measuring 35  mm. Electronically Signed: By: Ena Dawley On: 12/30/2019 20:24   CT ANGIO CHEST AORTA W/CM & OR WO/CM  Result Date: 12/31/2019 CLINICAL DATA:  79 year old male with history of severe aortic stenosis. Preprocedural evaluation prior to potential transcatheter aortic valve replacement (TAVR). EXAM: CT ANGIOGRAPHY CHEST, ABDOMEN AND PELVIS TECHNIQUE: Non-contrast CT of the chest was initially obtained. Multidetector CT imaging through the chest, abdomen and pelvis was performed using the standard protocol during bolus administration of intravenous contrast. Multiplanar reconstructed images and MIPs were obtained and reviewed to evaluate the vascular anatomy. CONTRAST:  113mL OMNIPAQUE IOHEXOL 350 MG/ML SOLN COMPARISON:  CT the abdomen and pelvis 10/29/2017. Chest CTA 06/07/2009. FINDINGS: CTA CHEST FINDINGS Cardiovascular: Heart size is mildly enlarged. Small amount of pericardial fluid and/or thickening. No pericardial calcifications. There is aortic atherosclerosis, as well as atherosclerosis of the great vessels of the mediastinum and the coronary arteries, including calcified atherosclerotic plaque in the left main, left anterior descending, left circumflex and right coronary arteries. Severe thickening calcification of the aortic valve. Calcifications of the mitral annulus. Mediastinum/Lymph Nodes: No pathologically enlarged mediastinal or hilar lymph nodes. Esophagus is unremarkable in appearance. No axillary lymphadenopathy. Lungs/Pleura: Moderate bilateral pleural effusions. Left pleural effusion is completely simple lying dependently. Right pleural effusion is predominantly dependently located, although there is a small loculated component in the periphery of the anterolateral right hemithorax (axial image 69 of series 4). These are  associated with areas of passive subsegmental atelectasis in the lower lobes of the lungs bilaterally. No acute consolidative airspace disease. No suspicious appearing  pulmonary nodules or masses are noted. Musculoskeletal/Soft Tissues: There are no aggressive appearing lytic or blastic lesions noted in the visualized portions of the skeleton. CTA ABDOMEN AND PELVIS FINDINGS Hepatobiliary: Liver has a slightly shrunken appearance and nodular contour, suggesting underlying cirrhosis. Two somewhat ill-defined areas of hypervascularity are noted in the central aspect of the liver, one adjacent to the gallbladder fossa (axial image 112 of series 4) measuring 1.6 x 0.9 cm, and the other in segment 4A of the liver (axial image 97 of series 4) measuring 1.4 x 1.2 cm. No intra or extrahepatic biliary ductal dilatation. Status post cholecystectomy. Pancreas: No pancreatic mass. No pancreatic ductal dilatation. No well-defined pancreatic or peripancreatic fluid collections to suggest pseudocyst. Spleen: Unremarkable. Adrenals/Urinary Tract: Bilateral kidneys and adrenal glands are normal in appearance. No hydroureteronephrosis. Urinary bladder is partially obscured by beam hardening artifact from the patient's left hip arthroplasty, but visualized portions are unremarkable. Stomach/Bowel: Fluid adjacent to the greater curvature of the proximal stomach, presumably some fluid tracking in the gastrosplenic ligament. Otherwise, stomach is unremarkable in appearance. No pathologic dilatation of small bowel or colon. Several small bowel diverticulae are noted in the duodenum and proximal jejunum, measuring up to 2.5 x 2.1 cm in the proximal jejunum (axial image 125 of series 4), without surrounding inflammatory changes to suggest an associated diverticulitis at this time. 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 Normal appendix. Vascular/Lymphatic: Aortic atherosclerosis with vascular findings and measurements pertinent to potential TAVR procedure, as detailed below. Fusiform aneurysmal dilatation of the  infrarenal abdominal aorta measuring up to 4.7 x 4.2 cm. No lymphadenopathy noted in the abdomen or pelvis. Reproductive: Prostate gland and seminal vesicles are unremarkable in appearance. Other: Small volume of ascites.  No pneumoperitoneum. Musculoskeletal: Status post PLIF from L3-L5 with interbody grafts at L3-L4 and L4-L5. Status post left hip arthroplasty. 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 There are no aggressive appearing lytic or blastic lesions noted in the visualized portions of the skeleton. VASCULAR MEASUREMENTS PERTINENT TO TAVR: AORTA: Minimal Aortic Diameter-20 x 17 mm Severity of Aortic Calcification-moderate to severe RIGHT PELVIS: Right Common Iliac Artery - Minimal Diameter-12.0 x 10.2 mm Tortuosity-mild Calcification-moderate Right External Iliac Artery - Minimal Diameter-7.0 x 7.4 mm Tortuosity-moderate Calcification-mild Right Common Femoral Artery - Minimal Diameter-7.8 x 6.6 mm Tortuosity-mild Calcification-moderate LEFT PELVIS: Left Common Iliac Artery - Minimal Diameter-10.8 x 11.3 mm Tortuosity-mild Calcification-mild Left External Iliac Artery - Minimal Diameter-7.8 x 6.6 mm Tortuosity-moderate Calcification-none Left Common Femoral Artery - Minimal Diameter-7.0 x 7.5 mm Tortuosity-mild Calcification-moderate Review of the MIP images confirms the above findings. IMPRESSION: 1. Vascular findings and measurements pertinent to potential TAVR procedure, as detailed above. 2. Severe thickening calcification of the aortic valve, compatible with reported clinical history of severe aortic stenosis. 3. Aortic atherosclerosis, in addition to left main and 3 vessel coronary artery disease. There is also fusiform aneurysmal dilatation of the infrarenal abdominal aorta which measures up to 4.7 x 4.2 cm in diameter. Recommend followup by abdomen and pelvis CTA in 6 months, and vascular surgery  referral/consultation if not already obtained. This recommendation follows ACR consensus guidelines: White Paper of the ACR Incidental Findings Committee II on Vascular Findings. J Am Coll Radiol 2013; 10:789-794. Aortic  aneurysm NOS (ICD10-I71.9). 4. Mild cardiomegaly. 5. Moderate bilateral pleural effusions with partial loculation of a small portion of the right pleural effusion. 6. Morphologic changes in the liver suggestive of underlying cirrhosis with 2 ill-defined areas of hypervascularity. These may simply represent benign perfusion anomalies, however, given the cirrhotic changes, further evaluation with nonemergent abdominal MRI with and without IV gadolinium is strongly recommended in the near future to exclude underlying hepatocellular carcinoma. Correlation with AFP levels is also recommended. 7. Small volume of ascites. 8. Colonic and small bowel diverticulosis without evidence of acute diverticulitis at this time. 9. Additional incidental findings, as above. Electronically Signed   By: Vinnie Langton M.D.   On: 12/31/2019 08:23   VAS US CAROTID  Result Date: 12/30/2019 Carotid Arterial Duplex Study Indications:      Pre- TAVR. Risk Factors:     Hypertension, hyperlipidemia. Comparison Study: 09-04-2018 Prior carotid duplex showed RT ICA 1-39%, LT ICA                   1-39% Performing Technologist: HODGE, RACHEL, M  Examination Guidelines: A complete evaluation includes B-mode imaging, spectral Doppler, color Doppler, and power Doppler as needed of all accessible portions of each vessel. Bilateral testing is considered an integral part of a complete examination. Limited examinations for reoccurring indications may be performed as noted.  Right Carotid Findings: +----------+--------+--------+--------+--------------------------+--------+           PSV cm/sEDV cm/sStenosisPlaque Description        Comments +----------+--------+--------+--------+--------------------------+--------+ CCA Prox   71      11              heterogenous                       +----------+--------+--------+--------+--------------------------+--------+ CCA Distal97      14              heterogenous                       +----------+--------+--------+--------+--------------------------+--------+ ICA Prox  138     27      1-39%   heterogenous and irregular         +----------+--------+--------+--------+--------------------------+--------+ ICA Distal100     28                                                 +----------+--------+--------+--------+--------------------------+--------+ ECA       190             >50%                                       +----------+--------+--------+--------+--------------------------+--------+ +----------+--------+-------+----------------+-------------------+           PSV cm/sEDV cmsDescribe        Arm Pressure (mmHG) +----------+--------+-------+----------------+-------------------+ [id number]             Multiphasic, WNL                    +----------+--------+-------+----------------+-------------------+ +---------+--------+--+--------+--+---------+ VertebralPSV cm/s62EDV cm/s15Antegrade +---------+--------+--+--------+--+---------+  Left Carotid Findings: +----------+-------+-------+--------+------------------------+-----------------+           PSV    EDV    StenosisPlaque Description      Comments  cm/s   cm/s                                                     +----------+-------+-------+--------+------------------------+-----------------+ CCA Prox  93     15             heterogenous                              +----------+-------+-------+--------+------------------------+-----------------+ CCA Distal88     13             heterogenous            intimal                                                                   thickening         +----------+-------+-------+--------+------------------------+-----------------+ ICA Prox  98     20     1-39%   calcific and irregular                    +----------+-------+-------+--------+------------------------+-----------------+ ICA Distal67     22                                                       +----------+-------+-------+--------+------------------------+-----------------+ ECA       220           >50%    heterogenous and                                                          irregular                                 +----------+-------+-------+--------+------------------------+-----------------+ +----------+--------+--------+----------------+-------------------+           PSV cm/sEDV cm/sDescribe        Arm Pressure (mmHG) +----------+--------+--------+----------------+-------------------+ [id number]              Multiphasic, WNL                    +----------+--------+--------+----------------+-------------------+ +---------+--------+--+--------+--+---------+ VertebralPSV cm/s44EDV cm/s13Antegrade +---------+--------+--+--------+--+---------+   Summary: Right Carotid: Velocities in the right ICA are consistent with a 1-39% stenosis.                Non-hemodynamically significant plaque <50% noted in the CCA. The                ECA appears >50% stenosed. Left Carotid: Velocities in the left ICA are consistent with a 1-39% stenosis.               Non-hemodynamically significant plaque <50% noted in the CCA. The  ECA appears >50% stenosed. Vertebrals:  Bilateral vertebral arteries demonstrate antegrade flow. Subclavians: Normal flow hemodynamics were seen in bilateral subclavian              arteries. *See table(s) above for measurements and observations.  Electronically signed by Harold Barban MD on 12/30/2019 at 5:10:03 PM.    Final    VAS [place] UPPER EXT VEIN MAPPING (PRE-OP AVF)  Result Date: 12/30/2019 UPPER EXTREMITY VEIN MAPPING   Indications: Pre-access. Comparison Study: No prior studies. Performing Technologist: Darlin Coco, M  Examination Guidelines: A complete evaluation includes B-mode imaging, spectral Doppler, color Doppler, and power Doppler as needed of all accessible portions of each vessel. Bilateral testing is considered an integral part of a complete examination. Limited examinations for reoccurring indications may be performed as noted. +-----------------+-------------+----------+---------+ Right Cephalic   Diameter (cm)Depth (cm)Findings  +-----------------+-------------+----------+---------+ Shoulder             0.23        0.49             +-----------------+-------------+----------+---------+ Prox upper arm       0.26        0.58             +-----------------+-------------+----------+---------+ Mid upper arm        0.23        0.21             +-----------------+-------------+----------+---------+ Dist upper arm       0.23        0.24             +-----------------+-------------+----------+---------+ Antecubital fossa    0.29        0.34   branching +-----------------+-------------+----------+---------+ Prox forearm         0.26        0.27   branching +-----------------+-------------+----------+---------+ Mid forearm          0.16        0.32             +-----------------+-------------+----------+---------+ Dist forearm         0.18        0.28             +-----------------+-------------+----------+---------+ Wrist                0.16        0.29             +-----------------+-------------+----------+---------+ +-----------------+-------------+----------+--------------+ Right Basilic    Diameter (cm)Depth (cm)   Findings    +-----------------+-------------+----------+--------------+ Shoulder             0.74                              +-----------------+-------------+----------+--------------+ Prox upper arm       0.49                               +-----------------+-------------+----------+--------------+ Mid upper arm        0.33                 branching    +-----------------+-------------+----------+--------------+ Dist upper arm       0.28                              +-----------------+-------------+----------+--------------+ Antecubital fossa    0.19  branching    +-----------------+-------------+----------+--------------+ Prox forearm                            not visualized +-----------------+-------------+----------+--------------+ Mid forearm                             not visualized +-----------------+-------------+----------+--------------+ Distal forearm                          not visualized +-----------------+-------------+----------+--------------+ Wrist                                   not visualized +-----------------+-------------+----------+--------------+ +-----------------+-------------+----------+--------+ Left Cephalic    Diameter (cm)Depth (cm)Findings +-----------------+-------------+----------+--------+ Shoulder             0.34        0.45            +-----------------+-------------+----------+--------+ Prox upper arm       0.35        0.36            +-----------------+-------------+----------+--------+ Mid upper arm        0.33        0.28            +-----------------+-------------+----------+--------+ Dist upper arm       0.33        0.36            +-----------------+-------------+----------+--------+ Antecubital fossa    0.38        0.32            +-----------------+-------------+----------+--------+ Prox forearm         0.23        0.72            +-----------------+-------------+----------+--------+ Mid forearm          0.22        0.30            +-----------------+-------------+----------+--------+ Dist forearm         0.20        0.36            +-----------------+-------------+----------+--------+ Wrist                 0.16        0.26            +-----------------+-------------+----------+--------+ +-----------------+-------------+----------+--------------+ Left Basilic     Diameter (cm)Depth (cm)   Findings    +-----------------+-------------+----------+--------------+ Prox upper arm       0.26                              +-----------------+-------------+----------+--------------+ Mid upper arm        0.23                              +-----------------+-------------+----------+--------------+ Dist upper arm       0.30                              +-----------------+-------------+----------+--------------+ Antecubital fossa    0.21                 branching    +-----------------+-------------+----------+--------------+ Prox forearm  not visualized +-----------------+-------------+----------+--------------+ Mid forearm                             not visualized +-----------------+-------------+----------+--------------+ Distal forearm                          not visualized +-----------------+-------------+----------+--------------+ Wrist                                   not visualized +-----------------+-------------+----------+--------------+ *See table(s) above for measurements and observations.  Diagnosing physician: Harold Barban MD Electronically signed by Harold Barban MD on 12/30/2019 at 5:09:24 PM.    Final    CT Angio Abd/Pel w/ and/or w/o  Result Date: 12/31/2019 CLINICAL DATA:  79 year old male with history of severe aortic stenosis. Preprocedural evaluation prior to potential transcatheter aortic valve replacement (TAVR). EXAM: CT ANGIOGRAPHY CHEST, ABDOMEN AND PELVIS TECHNIQUE: Non-contrast CT of the chest was initially obtained. Multidetector CT imaging through the chest, abdomen and pelvis was performed using the standard protocol during bolus administration of intravenous contrast. Multiplanar reconstructed images and MIPs were obtained  and reviewed to evaluate the vascular anatomy. CONTRAST:  130mL OMNIPAQUE IOHEXOL 350 MG/ML SOLN COMPARISON:  CT the abdomen and pelvis 10/29/2017. Chest CTA 06/07/2009. FINDINGS: CTA CHEST FINDINGS Cardiovascular: Heart size is mildly enlarged. Small amount of pericardial fluid and/or thickening. No pericardial calcifications. There is aortic atherosclerosis, as well as atherosclerosis of the great vessels of the mediastinum and the coronary arteries, including calcified atherosclerotic plaque in the left main, left anterior descending, left circumflex and right coronary arteries. Severe thickening calcification of the aortic valve. Calcifications of the mitral annulus. Mediastinum/Lymph Nodes: No pathologically enlarged mediastinal or hilar lymph nodes. Esophagus is unremarkable in appearance. No axillary lymphadenopathy. Lungs/Pleura: Moderate bilateral pleural effusions. Left pleural effusion is completely simple lying dependently. Right pleural effusion is predominantly dependently located, although there is a small loculated component in the periphery of the anterolateral right hemithorax (axial image 69 of series 4). These are associated with areas of passive subsegmental atelectasis in the lower lobes of the lungs bilaterally. No acute consolidative airspace disease. No suspicious appearing pulmonary nodules or masses are noted. Musculoskeletal/Soft Tissues: There are no aggressive appearing lytic or blastic lesions noted in the visualized portions of the skeleton. CTA ABDOMEN AND PELVIS FINDINGS Hepatobiliary: Liver has a slightly shrunken appearance and nodular contour, suggesting underlying cirrhosis. Two somewhat ill-defined areas of hypervascularity are noted in the central aspect of the liver, one adjacent to the gallbladder fossa (axial image 112 of series 4) measuring 1.6 x 0.9 cm, and the other in segment 4A of the liver (axial image 97 of series 4) measuring 1.4 x 1.2 cm. No intra or extrahepatic  biliary ductal dilatation. Status post cholecystectomy. Pancreas: No pancreatic mass. No pancreatic ductal dilatation. No well-defined pancreatic or peripancreatic fluid collections to suggest pseudocyst. Spleen: Unremarkable. Adrenals/Urinary Tract: Bilateral kidneys and adrenal glands are normal in appearance. No hydroureteronephrosis. Urinary bladder is partially obscured by beam hardening artifact from the patient's left hip arthroplasty, but visualized portions are unremarkable. Stomach/Bowel: Fluid adjacent to the greater curvature of the proximal stomach, presumably some fluid tracking in the gastrosplenic ligament. Otherwise, stomach is unremarkable in appearance. No pathologic dilatation of small bowel or colon. Several small bowel diverticulae are noted in the duodenum and proximal jejunum, measuring up to 2.5 x 2.1 cm  in the proximal jejunum (axial image 125 of series 4), without surrounding inflammatory changes to suggest an associated diverticulitis at this time. 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 Normal appendix. Vascular/Lymphatic: Aortic atherosclerosis with vascular findings and measurements pertinent to potential TAVR procedure, as detailed below. Fusiform aneurysmal dilatation of the infrarenal abdominal aorta measuring up to 4.7 x 4.2 cm. No lymphadenopathy noted in the abdomen or pelvis. Reproductive: Prostate gland and seminal vesicles are unremarkable in appearance. Other: Small volume of ascites.  No pneumoperitoneum. Musculoskeletal: Status post PLIF from L3-L5 with interbody grafts at L3-L4 and L4-L5. Status post left hip arthroplasty. 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 There are no aggressive appearing lytic or blastic lesions noted in the visualized portions of the skeleton. VASCULAR MEASUREMENTS  PERTINENT TO TAVR: AORTA: Minimal Aortic Diameter-20 x 17 mm Severity of Aortic Calcification-moderate to severe RIGHT PELVIS: Right Common Iliac Artery - Minimal Diameter-12.0 x 10.2 mm Tortuosity-mild Calcification-moderate Right External Iliac Artery - Minimal Diameter-7.0 x 7.4 mm Tortuosity-moderate Calcification-mild Right Common Femoral Artery - Minimal Diameter-7.8 x 6.6 mm Tortuosity-mild Calcification-moderate LEFT PELVIS: Left Common Iliac Artery - Minimal Diameter-10.8 x 11.3 mm Tortuosity-mild Calcification-mild Left External Iliac Artery - Minimal Diameter-7.8 x 6.6 mm Tortuosity-moderate Calcification-none Left Common Femoral Artery - Minimal Diameter-7.0 x 7.5 mm Tortuosity-mild Calcification-moderate Review of the MIP images confirms the above findings. IMPRESSION: 1. Vascular findings and measurements pertinent to potential TAVR procedure, as detailed above. 2. Severe thickening calcification of the aortic valve, compatible with reported clinical history of severe aortic stenosis. 3. Aortic atherosclerosis, in addition to left main and 3 vessel coronary artery disease. There is also fusiform aneurysmal dilatation of the infrarenal abdominal aorta which measures up to 4.7 x 4.2 cm in diameter. Recommend followup by abdomen and pelvis CTA in 6 months, and vascular surgery referral/consultation if not already obtained. This recommendation follows ACR consensus guidelines: White Paper of the ACR Incidental Findings Committee II on Vascular Findings. J Am Coll Radiol 2013; 10:789-794. Aortic aneurysm NOS (ICD10-I71.9). 4. Mild cardiomegaly. 5. Moderate bilateral pleural effusions with partial loculation of a small portion of the right pleural effusion. 6. Morphologic changes in the liver suggestive of underlying cirrhosis with 2 ill-defined areas of hypervascularity. These may simply represent benign perfusion anomalies, however, given the cirrhotic changes, further evaluation with nonemergent abdominal  MRI with and without IV gadolinium is strongly recommended in the near future to exclude underlying hepatocellular carcinoma. Correlation with AFP levels is also recommended. 7. Small volume of ascites. 8. Colonic and small bowel diverticulosis without evidence of acute diverticulitis at this time. 9. Additional incidental findings, as above. Electronically Signed   By: Vinnie Langton M.D.   On: 12/31/2019 08:23    Labs: BMET Recent Labs  Lab 12/28/19 1410 12/30/19 0818 12/31/19 0028  NA 138 137 131*  K 3.7 3.8 3.8  CL 106 104 102  CO2 22 23 22   GLUCOSE 150* 96 132*  BUN 58* 62* 60*  CREATININE 3.15* 2.99* 2.99*  CALCIUM 8.2* 8.4* 7.7*  PHOS  --  3.9 3.5   CBC Recent Labs  Lab 12/28/19 1410 12/28/19 1410 12/30/19 0818 12/31/19 0028 12/31/19 0524 12/31/19 1317  WBC 8.6  --  6.8 5.7  --  5.0  HGB 8.2*   < > 8.0* 7.1* 6.8* 8.6*  HCT 26.7*   < > 25.0* 21.9* 21.3*  27.1*  MCV 97.8  --  96.9 94.8  --  96.4  PLT 147*  --  144* 139*  --  156   < > = values in this interval not displayed.    Medications:    . sodium chloride   Intravenous Once  . allopurinol  300 mg Oral Daily  . amLODipine  10 mg Oral Daily  . aspirin EC  81 mg Oral Daily  . brimonidine  1 drop Both Eyes BID  . cholecalciferol  5,000 Units Oral Daily  . clopidogrel  75 mg Oral Daily  . darbepoetin (ARANESP) injection - NON-DIALYSIS  60 mcg Subcutaneous Q Wed-1800  . dorzolamide  1 drop Right Eye BID  . fenofibrate  160 mg Oral Daily  . furosemide  40 mg Oral Daily  . hydrALAZINE  25 mg Oral TID  . isosorbide mononitrate  30 mg Oral BID  . metoprolol tartrate  25 mg Oral BID  . nepafenac  1 drop Right Eye QHS  . pravastatin  40 mg Oral Daily   Elmarie Shiley, MD 12/31/2019, 2:15 PM

## 2019-12-31 NOTE — Op Note (Signed)
    Patient name: Terry Garner MRN: [id number] DOB: [birth year] Sex: male  12/31/2019 Pre-operative Diagnosis: ESRD Post-operative diagnosis:  Same Surgeon:  Annamarie Major Assistants:  Ivin Booty Procedure:   Left brachiocephalic fistula Anesthesia: Axillary block Blood Loss: Minimal Specimens: None  Findings: 4 mm cephalic vein, 3.5 mm artery  Indications: The patient has severe aortic stenosis and is going to need a TAVR.  They would like a fistula placed and functional before his operation.  Procedure:  The patient was identified in the holding area and taken to [place] 16  The patient was then placed supine on the table. regional anesthesia was administered.  The patient was prepped and draped in the usual sterile fashion.  A time out was called and antibiotics were administered.  A PA was necessary for the procedure to provide suction retraction and suture closure.  Ultrasound was used to evaluate the cephalic vein in the left arm.  It was of adequate caliber at the antecubital crease.  A transverse incision was made just proximal to the antecubital crease with a 10 blade.  Cautery was used to divide the subcutaneous tissue.  The brachial artery was dissected free.  This was mildly diseased.  It was a 3.5 mm artery.  It was encircled with a red vessel loop.  I then dissected out the cephalic vein which was a 4 mm vein.  There was a fair amount of scar tissue around the vein at the antecubital crease.  It was fully mobilized throughout the width of the incision.  It was marked for orientation and then ligated distally.  The vein distended nicely with heparin saline.  The brachial artery was occluded with vascular clamps.  A #11 blade was used to make an arteriotomy which was extended longitudinally with Potts scissors.  The vein was cut the appropriate length and spatulated to fit the size of the arteriotomy.  A running anastomosis was created with 6-0 Prolene.  Prior to completion, the  appropriate flushing maneuvers were performed and the anastomosis was completed.  There was an excellent thrill within the fistula and a palpable radial pulse.  I inspected the course of the vein to make sure there were no kinks.  Additional side branches were divided between silk ties.  The wound was then irrigated.  Hemostasis was achieved.  The incision was closed with 2 layers of Vicryl followed by Dermabond.  There were no immediate complications.   Disposition: To PACU stable.   Theotis Burrow, M.D., [facility] [place] Office: [phone] Pager:  [phone]

## 2020-01-01 ENCOUNTER — Ambulatory Visit: Payer: Medicare Other | Admitting: Cardiovascular Disease

## 2020-01-01 ENCOUNTER — Encounter (HOSPITAL_COMMUNITY): Payer: Self-pay | Admitting: Surgery

## 2020-01-01 DIAGNOSIS — I714 Abdominal aortic aneurysm, without rupture: Secondary | ICD-10-CM

## 2020-01-01 DIAGNOSIS — I209 Angina pectoris, unspecified: Secondary | ICD-10-CM

## 2020-01-01 LAB — TYPE AND SCREEN
ABO/RH(D): A POS
Antibody Screen: NEGATIVE
Unit division: 0

## 2020-01-01 LAB — RENAL FUNCTION PANEL
Albumin: 2.1 g/dL — ABNORMAL LOW (ref 3.5–5.0)
Anion gap: 9 (ref 5–15)
BUN: 68 mg/dL — ABNORMAL HIGH (ref 8–23)
CO2: 19 mmol/L — ABNORMAL LOW (ref 22–32)
Calcium: 8.2 mg/dL — ABNORMAL LOW (ref 8.9–10.3)
Chloride: 108 mmol/L (ref 98–111)
Creatinine, Ser: 3.27 mg/dL — ABNORMAL HIGH (ref 0.61–1.24)
GFR calc Af Amer: 19 mL/min — ABNORMAL LOW (ref >60)
GFR calc non Af Amer: 17 mL/min — ABNORMAL LOW (ref >60)
Glucose, Bld: 98 mg/dL (ref 70–99)
Phosphorus: 4.1 mg/dL (ref 2.5–4.6)
Potassium: 4.2 mmol/L (ref 3.5–5.1)
Sodium: 136 mmol/L (ref 135–145)

## 2020-01-01 LAB — BPAM RBC
Blood Product Expiration Date: 202108012359
ISSUE DATE / TIME: 202107081039
Unit Type and Rh: 6200

## 2020-01-01 LAB — CBC
HCT: 24 % — ABNORMAL LOW (ref 39.0–52.0)
Hemoglobin: 7.8 g/dL — ABNORMAL LOW (ref 13.0–17.0)
MCH: 31 pg (ref 26.0–34.0)
MCHC: 32.5 g/dL (ref 30.0–36.0)
MCV: 95.2 fL (ref 80.0–100.0)
Platelets: 143 10*3/uL — ABNORMAL LOW (ref 150–400)
RBC: 2.52 MIL/uL — ABNORMAL LOW (ref 4.22–5.81)
RDW: 15.6 % — ABNORMAL HIGH (ref 11.5–15.5)
WBC: 4.8 10*3/uL (ref 4.0–10.5)
nRBC: 0 % (ref 0.0–0.2)

## 2020-01-01 MED ORDER — ISOSORBIDE MONONITRATE ER 30 MG PO TB24: 30.0000 mg | ORAL_TABLET | Freq: Two times a day (BID) | ORAL | 0 refills | Status: DC

## 2020-01-01 MED ORDER — APIXABAN 2.5 MG PO TABS: 2.5000 mg | ORAL_TABLET | Freq: Two times a day (BID) | ORAL | 0 refills | Status: DC

## 2020-01-01 MED ORDER — METOPROLOL TARTRATE 25 MG PO TABS: 25.0000 mg | ORAL_TABLET | Freq: Two times a day (BID) | ORAL | 0 refills | Status: DC

## 2020-01-01 MED ORDER — APIXABAN 2.5 MG PO TABS: 2.5000 mg | ORAL_TABLET | Freq: Two times a day (BID) | ORAL | Status: DC

## 2020-01-01 MED ORDER — HYDRALAZINE HCL 25 MG PO TABS: 25.0000 mg | ORAL_TABLET | Freq: Three times a day (TID) | ORAL | 0 refills | Status: DC

## 2020-01-01 NOTE — Progress Notes (Addendum)
Progress Note  Patient Name: Terry Garner Date of Encounter: 01/01/2020  [facility] Cardiologist: Quay Burow, MD   Subjective   No overnight events. Terry Garner states he is feeling well this morning but is eager to go home. He tolerated AV fistula placement without complications. He denies any dizziness, chest pain, SOB, orthopnea. He denies any drainage or bleeding from AV fistula site.   Inpatient Medications    Scheduled Meds: . sodium chloride   Intravenous Once  . allopurinol  300 mg Oral Daily  . amLODipine  10 mg Oral Daily  . apixaban  2.5 mg Oral BID  . aspirin EC  81 mg Oral Daily  . brimonidine  1 drop Both Eyes BID  . cholecalciferol  5,000 Units Oral Daily  . clopidogrel  75 mg Oral Daily  . darbepoetin (ARANESP) injection - NON-DIALYSIS  60 mcg Subcutaneous Q Wed-1800  . dorzolamide  1 drop Right Eye BID  . furosemide  40 mg Oral Daily  . hydrALAZINE  25 mg Oral TID  . isosorbide mononitrate  30 mg Oral BID  . metoprolol tartrate  25 mg Oral BID  . nepafenac  1 drop Right Eye QHS  . pravastatin  40 mg Oral Daily   Continuous Infusions: . sodium chloride 100 mL/hr at 12/31/19 1425  . ferumoxytol 510 mg (12/30/19 1220)   PRN Meds: acetaminophen, fentaNYL (SUBLIMAZE) injection, nitroGLYCERIN, ondansetron (ZOFRAN) IV, triamcinolone cream   Vital Signs    Vitals:   12/31/19 2001 12/31/19 2104 01/01/20 0246 01/01/20 0300  BP: (!) 166/91 139/67  (!) 138/55  Pulse: 79   73  Resp: 17   19  Temp: 97.7 F (36.5 C)   98.2 F (36.8 C)  TempSrc: Oral   Oral  SpO2: 90%   94%  Weight:   74.8 kg   Height:        Intake/Output Summary (Last 24 hours) at 01/01/2020 0849 Last data filed at 12/31/2019 2104 Gross per 24 hour  Intake 1020 ml  Output 775 ml  Net 245 ml   Last 3 Weights 01/01/2020 12/31/2019 12/30/2019  Weight (lbs) 165 lb 165 lb 11.2 oz 161 lb 6.4 oz  Weight (kg) 74.844 kg 75.161 kg 73.211 kg      Telemetry    Atrial fibrillation with  rates 80-90s.   ECG    No new tracing  Physical Exam   Physical Exam Vitals and nursing note reviewed.  Constitutional:      General: He is not in acute distress.    Appearance: He is normal weight.  Cardiovascular:     Rate and Rhythm: Normal rate. Rhythm irregularly irregular.     Heart sounds: Murmur heard.  Systolic murmur is present with a grade of 4/6.   Pulmonary:     Effort: Pulmonary effort is normal. No respiratory distress.     Breath sounds: Examination of the right-lower field reveals rales. Examination of the left-lower field reveals rales. Rales (fine crackles) present. No decreased breath sounds, wheezing or rhonchi.  Musculoskeletal:     Right lower leg: No edema.     Left lower leg: No edema.  Neurological:     General: No focal deficit present.     Mental Status: He is alert and oriented to person, place, and time.    Labs    High Sensitivity Troponin:   Recent Labs  Lab 12/28/19 1410 12/28/19 1648  TROPONINIHS 50* 48*      Chemistry Recent  Labs  Lab 12/28/19 1457 12/28/19 1457 12/30/19 0818 12/31/19 0028 01/01/20 0515  NA  --    < > 137 131* 136  K  --    < > 3.8 3.8 4.2  CL  --    < > 104 102 108  CO2  --    < > 23 22 19*  GLUCOSE  --    < > 96 132* 98  BUN  --    < > 62* 60* 68*  CREATININE  --    < > 2.99* 2.99* 3.27*  CALCIUM  --    < > 8.4* 7.7* 8.2*  PROT 5.1*  --   --   --   --   ALBUMIN 2.9*   < > 2.5* 2.1* 2.1*  AST 45*  --   --   --   --   ALT 18  --   --   --   --   ALKPHOS 41  --   --   --   --   BILITOT 0.8  --   --   --   --   GFRNONAA  --    < > 19* 19* 17*  GFRAA  --    < > 22* 22* 19*  ANIONGAP  --    < > 10 7 9    < > = values in this interval not displayed.     Hematology Recent Labs  Lab 12/31/19 0028 12/31/19 0028 12/31/19 0524 12/31/19 1317 01/01/20 0515  WBC 5.7  --   --  5.0 4.8  RBC 2.31*  --   --  2.81*  2.84* 2.52*  HGB 7.1*   < > 6.8* 8.6* 7.8*  HCT 21.9*   < > 21.3* 27.1* 24.0*  MCV 94.8   --   --  96.4 95.2  MCH 30.7  --   --  30.6 31.0  MCHC 32.4  --   --  31.7 32.5  RDW 15.7*  --   --  15.2 15.6*  PLT 139*  --   --  156 143*   < > = values in this interval not displayed.    BNP Recent Labs  Lab 12/28/19 1419  BNP 1,117.2*     DDimer No results for input(s): DDIMER in the last 168 hours.   Radiology    DG Orthopantogram  Result Date: 12/30/2019 CLINICAL DATA:  Upcoming heart surgery EXAM: ORTHOPANTOGRAM/PANORAMIC COMPARISON:  None. FINDINGS: Extensive dental hardware is noted. No definitive periapical lucencies are identified to suggest abscess formation. No significant acute dental caries are noted at this time. No bony abnormality is seen. IMPRESSION: Changes of prior dental hardware. No acute care ease or periapical abscesses are seen Electronically Signed   By: Inez Catalina M.D.   On: 12/30/2019 21:15   US AORTA  Result Date: 12/30/2019 CLINICAL DATA:  Known abdominal aortic aneurysm EXAM: ULTRASOUND OF ABDOMINAL AORTA TECHNIQUE: Ultrasound examination of the abdominal aorta and proximal common iliac arteries was performed to evaluate for aneurysm. Additional color and Doppler images of the distal aorta were obtained to document patency. COMPARISON:  CT angiography 12/30/2019 FINDINGS: Abdominal aortic measurements as follows: Proximal:  3.0 x 3.0 cm Mid:  3.0 x 3.1 cm Distal:  3.8 x 4.1 cm Patent: Yes, peak systolic velocity is 96.7 cm/s Right common iliac artery: 0.8 x 1.1 cm Left common iliac artery: 1.0 x 1.0 cm IMPRESSION: Redemonstration of the infrarenal abdominal aortic aneurysm measuring up to 4.1 cm  in maximal diameter, much better characterized on the same day CT angiography of the chest, abdomen and pelvis. Electronically Signed   By: Lovena Le M.D.   On: 12/30/2019 21:01   CT CORONARY MORPH W/CTA COR W/SCORE W/CA W/CM &/OR WO/CM  Addendum Date: 12/31/2019   ADDENDUM REPORT: 12/31/2019 07:56 ADDENDUM: Extracardiac findings will be described separately  under dictation for contemporaneously obtained CTA chest, abdomen and pelvis dated 12/30/2019. Please see that report for full description of relevant extracardiac findings. Electronically Signed   By: Vinnie Langton M.D.   On: 12/31/2019 07:56   Result Date: 12/31/2019 CLINICAL DATA:  79 year old male with severe aortic stenosis being evaluated for a TAVR procedure. EXAM: Cardiac TAVR CT TECHNIQUE: The patient was scanned on a [employer]. A 120 kV retrospective scan was triggered in the descending thoracic aorta at 111 HU's. Gantry rotation speed was 250 msecs and collimation was .6 mm. No beta blockade or nitro were given. The 3D data set was reconstructed in 5% intervals of the R-R cycle. Systolic and diastolic phases were analyzed on a dedicated work station using MPR, MIP and VRT modes. The patient received 80 cc of contrast. FINDINGS: Aortic Valve: Trileaflet aortic valve with moderately calcified leaflets and moderately restricted leaflet opening. Only minimal calcifications are extending into the LVOT. Aortic valve calcium score is 1745. Aorta: Normal caliber with moderate diffuse atherosclerotic plaque and calcifications. No dissection. Sinotubular Junction: 30 x 28 mm Ascending Thoracic Aorta: 33 x 32 mm Aortic Arch: 28 x 26 mm Descending Thoracic Aorta: 27 x 25 mm Sinus of Valsalva Measurements: Non-coronary: 33 mm Right -coronary: 31 mm Left -coronary: 34 mm Coronary Artery Height above Annulus: Left Main: 14 mm Right Coronary: 15 mm Virtual Basal Annulus Measurements: Maximum/Minimum Diameter: 26.3 x 22.6 mm Mean Diameter: 24.2 mm Perimeter: 77.7 mm Area: 460 mm2 Optimum Fluoroscopic Angle for Delivery:  LAO 6 CAU 6 IMPRESSION: 1. Trileaflet aortic valve with moderately calcified leaflets and moderately restricted leaflet opening. Only minimal calcifications are extending into the LVOT. Aortic valve calcium score is 1745. Annular measurements are suitable for delivery of a 26 mm  Edwards-SAPIEN 3 Ultra valve. 2. Sufficient coronary to annulus distance. 3. Optimum Fluoroscopic Angle for Delivery: LAO 6 CAU 6. 4. No thrombus in the left atrial appendage. 5. Dilated pulmonary artery measuring 35 mm. Electronically Signed: By: Ena Dawley On: 12/30/2019 20:24   CT ANGIO CHEST AORTA W/CM & OR WO/CM  Result Date: 12/31/2019 CLINICAL DATA:  79 year old male with history of severe aortic stenosis. Preprocedural evaluation prior to potential transcatheter aortic valve replacement (TAVR). EXAM: CT ANGIOGRAPHY CHEST, ABDOMEN AND PELVIS TECHNIQUE: Non-contrast CT of the chest was initially obtained. Multidetector CT imaging through the chest, abdomen and pelvis was performed using the standard protocol during bolus administration of intravenous contrast. Multiplanar reconstructed images and MIPs were obtained and reviewed to evaluate the vascular anatomy. CONTRAST:  139mL OMNIPAQUE IOHEXOL 350 MG/ML SOLN COMPARISON:  CT the abdomen and pelvis 10/29/2017. Chest CTA 06/07/2009. FINDINGS: CTA CHEST FINDINGS Cardiovascular: Heart size is mildly enlarged. Small amount of pericardial fluid and/or thickening. No pericardial calcifications. There is aortic atherosclerosis, as well as atherosclerosis of the great vessels of the mediastinum and the coronary arteries, including calcified atherosclerotic plaque in the left main, left anterior descending, left circumflex and right coronary arteries. Severe thickening calcification of the aortic valve. Calcifications of the mitral annulus. Mediastinum/Lymph Nodes: No pathologically enlarged mediastinal or hilar lymph nodes. Esophagus is unremarkable in appearance. No  axillary lymphadenopathy. Lungs/Pleura: Moderate bilateral pleural effusions. Left pleural effusion is completely simple lying dependently. Right pleural effusion is predominantly dependently located, although there is a small loculated component in the periphery of the anterolateral right  hemithorax (axial image 69 of series 4). These are associated with areas of passive subsegmental atelectasis in the lower lobes of the lungs bilaterally. No acute consolidative airspace disease. No suspicious appearing pulmonary nodules or masses are noted. Musculoskeletal/Soft Tissues: There are no aggressive appearing lytic or blastic lesions noted in the visualized portions of the skeleton. CTA ABDOMEN AND PELVIS FINDINGS Hepatobiliary: Liver has a slightly shrunken appearance and nodular contour, suggesting underlying cirrhosis. Two somewhat ill-defined areas of hypervascularity are noted in the central aspect of the liver, one adjacent to the gallbladder fossa (axial image 112 of series 4) measuring 1.6 x 0.9 cm, and the other in segment 4A of the liver (axial image 97 of series 4) measuring 1.4 x 1.2 cm. No intra or extrahepatic biliary ductal dilatation. Status post cholecystectomy. Pancreas: No pancreatic mass. No pancreatic ductal dilatation. No well-defined pancreatic or peripancreatic fluid collections to suggest pseudocyst. Spleen: Unremarkable. Adrenals/Urinary Tract: Bilateral kidneys and adrenal glands are normal in appearance. No hydroureteronephrosis. Urinary bladder is partially obscured by beam hardening artifact from the patient's left hip arthroplasty, but visualized portions are unremarkable. Stomach/Bowel: Fluid adjacent to the greater curvature of the proximal stomach, presumably some fluid tracking in the gastrosplenic ligament. Otherwise, stomach is unremarkable in appearance. No pathologic dilatation of small bowel or colon. Several small bowel diverticulae are noted in the duodenum and proximal jejunum, measuring up to 2.5 x 2.1 cm in the proximal jejunum (axial image 125 of series 4), without surrounding inflammatory changes to suggest an associated diverticulitis at this time. Numerous colonic diverticulae are also noted, particularly in the sigmoid colon, without definite surrounding  inflammatory changes. Normal appendix. Vascular/Lymphatic: Aortic atherosclerosis with vascular findings and measurements pertinent to potential TAVR procedure, as detailed below. Fusiform aneurysmal dilatation of the infrarenal abdominal aorta measuring up to 4.7 x 4.2 cm. No lymphadenopathy noted in the abdomen or pelvis. Reproductive: Prostate gland and seminal vesicles are unremarkable in appearance. Other: Small volume of ascites.  No pneumoperitoneum. Musculoskeletal: Status post PLIF from L3-L5 with interbody grafts at L3-L4 and L4-L5. Status post left hip arthroplasty. 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 There are no aggressive appearing lytic or blastic lesions noted in the visualized portions of the skeleton. VASCULAR MEASUREMENTS PERTINENT TO TAVR: AORTA: Minimal Aortic Diameter-20 x 17 mm Severity of Aortic Calcification-moderate to severe RIGHT PELVIS: Right Common Iliac Artery - Minimal Diameter-12.0 x 10.2 mm Tortuosity-mild Calcification-moderate Right External Iliac Artery - Minimal Diameter-7.0 x 7.4 mm Tortuosity-moderate Calcification-mild Right Common Femoral Artery - Minimal Diameter-7.8 x 6.6 mm Tortuosity-mild Calcification-moderate LEFT PELVIS: Left Common Iliac Artery - Minimal Diameter-10.8 x 11.3 mm Tortuosity-mild Calcification-mild Left External Iliac Artery - Minimal Diameter-7.8 x 6.6 mm Tortuosity-moderate Calcification-none Left Common Femoral Artery - Minimal Diameter-7.0 x 7.5 mm Tortuosity-mild Calcification-moderate Review of the MIP images confirms the above findings. IMPRESSION: 1. Vascular findings and measurements pertinent to potential TAVR procedure, as detailed above. 2. Severe thickening calcification of the aortic valve, compatible with reported clinical history of severe aortic stenosis. 3. Aortic atherosclerosis, in addition to left main and 3 vessel coronary artery  disease. There is also fusiform aneurysmal dilatation of the infrarenal abdominal aorta which measures up to 4.7 x 4.2 cm  in diameter. Recommend followup by abdomen and pelvis CTA in 6 months, and vascular surgery referral/consultation if not already obtained. This recommendation follows ACR consensus guidelines: White Paper of the ACR Incidental Findings Committee II on Vascular Findings. J Am Coll Radiol 2013; 10:789-794. Aortic aneurysm NOS (ICD10-I71.9). 4. Mild cardiomegaly. 5. Moderate bilateral pleural effusions with partial loculation of a small portion of the right pleural effusion. 6. Morphologic changes in the liver suggestive of underlying cirrhosis with 2 ill-defined areas of hypervascularity. These may simply represent benign perfusion anomalies, however, given the cirrhotic changes, further evaluation with nonemergent abdominal MRI with and without IV gadolinium is strongly recommended in the near future to exclude underlying hepatocellular carcinoma. Correlation with AFP levels is also recommended. 7. Small volume of ascites. 8. Colonic and small bowel diverticulosis without evidence of acute diverticulitis at this time. 9. Additional incidental findings, as above. Electronically Signed   By: Vinnie Langton M.D.   On: 12/31/2019 08:23   VAS US CAROTID  Result Date: 12/30/2019 Carotid Arterial Duplex Study Indications:      Pre- TAVR. Risk Factors:     Hypertension, hyperlipidemia. Comparison Study: 09-04-2018 Prior carotid duplex showed RT ICA 1-39%, LT ICA                   1-39% Performing Technologist: HODGE, RACHEL, M  Examination Guidelines: A complete evaluation includes B-mode imaging, spectral Doppler, color Doppler, and power Doppler as needed of all accessible portions of each vessel. Bilateral testing is considered an integral part of a complete examination. Limited examinations for reoccurring indications may be performed as noted.  Right Carotid Findings:  +----------+--------+--------+--------+--------------------------+--------+           PSV cm/sEDV cm/sStenosisPlaque Description        Comments +----------+--------+--------+--------+--------------------------+--------+ CCA Prox  71      11              heterogenous                       +----------+--------+--------+--------+--------------------------+--------+ CCA Distal97      14              heterogenous                       +----------+--------+--------+--------+--------------------------+--------+ ICA Prox  138     27      1-39%   heterogenous and irregular         +----------+--------+--------+--------+--------------------------+--------+ ICA Distal100     28                                                 +----------+--------+--------+--------+--------------------------+--------+ ECA       190             >50%                                       +----------+--------+--------+--------+--------------------------+--------+ +----------+--------+-------+----------------+-------------------+           PSV cm/sEDV cmsDescribe        Arm Pressure (mmHG) +----------+--------+-------+----------------+-------------------+ [id number]             Multiphasic, WNL                    +----------+--------+-------+----------------+-------------------+ +---------+--------+--+--------+--+---------+  VertebralPSV cm/s62EDV cm/s15Antegrade +---------+--------+--+--------+--+---------+  Left Carotid Findings: +----------+-------+-------+--------+------------------------+-----------------+           PSV    EDV    StenosisPlaque Description      Comments                    cm/s   cm/s                                                     +----------+-------+-------+--------+------------------------+-----------------+ CCA Prox  93     15             heterogenous                               +----------+-------+-------+--------+------------------------+-----------------+ CCA Distal88     13             heterogenous            intimal                                                                   thickening        +----------+-------+-------+--------+------------------------+-----------------+ ICA Prox  98     20     1-39%   calcific and irregular                    +----------+-------+-------+--------+------------------------+-----------------+ ICA Distal67     22                                                       +----------+-------+-------+--------+------------------------+-----------------+ ECA       220           >50%    heterogenous and                                                          irregular                                 +----------+-------+-------+--------+------------------------+-----------------+ +----------+--------+--------+----------------+-------------------+           PSV cm/sEDV cm/sDescribe        Arm Pressure (mmHG) +----------+--------+--------+----------------+-------------------+ [id number]              Multiphasic, WNL                    +----------+--------+--------+----------------+-------------------+ +---------+--------+--+--------+--+---------+ VertebralPSV cm/s44EDV cm/s13Antegrade +---------+--------+--+--------+--+---------+   Summary: Right Carotid: Velocities in the right ICA are consistent with a 1-39% stenosis.                Non-hemodynamically significant plaque <50% noted in the CCA. The  ECA appears >50% stenosed. Left Carotid: Velocities in the left ICA are consistent with a 1-39% stenosis.               Non-hemodynamically significant plaque <50% noted in the CCA. The               ECA appears >50% stenosed. Vertebrals:  Bilateral vertebral arteries demonstrate antegrade flow. Subclavians: Normal flow hemodynamics were seen in bilateral subclavian              arteries.  *See table(s) above for measurements and observations.  Electronically signed by Harold Barban MD on 12/30/2019 at 5:10:03 PM.    Final    VAS [place] UPPER EXT VEIN MAPPING (PRE-OP AVF)  Result Date: 12/30/2019 UPPER EXTREMITY VEIN MAPPING  Indications: Pre-access. Comparison Study: No prior studies. Performing Technologist: Darlin Coco, M  Examination Guidelines: A complete evaluation includes B-mode imaging, spectral Doppler, color Doppler, and power Doppler as needed of all accessible portions of each vessel. Bilateral testing is considered an integral part of a complete examination. Limited examinations for reoccurring indications may be performed as noted. +-----------------+-------------+----------+---------+ Right Cephalic   Diameter (cm)Depth (cm)Findings  +-----------------+-------------+----------+---------+ Shoulder             0.23        0.49             +-----------------+-------------+----------+---------+ Prox upper arm       0.26        0.58             +-----------------+-------------+----------+---------+ Mid upper arm        0.23        0.21             +-----------------+-------------+----------+---------+ Dist upper arm       0.23        0.24             +-----------------+-------------+----------+---------+ Antecubital fossa    0.29        0.34   branching +-----------------+-------------+----------+---------+ Prox forearm         0.26        0.27   branching +-----------------+-------------+----------+---------+ Mid forearm          0.16        0.32             +-----------------+-------------+----------+---------+ Dist forearm         0.18        0.28             +-----------------+-------------+----------+---------+ Wrist                0.16        0.29             +-----------------+-------------+----------+---------+ +-----------------+-------------+----------+--------------+ Right Basilic    Diameter (cm)Depth (cm)   Findings     +-----------------+-------------+----------+--------------+ Shoulder             0.74                              +-----------------+-------------+----------+--------------+ Prox upper arm       0.49                              +-----------------+-------------+----------+--------------+ Mid upper arm        0.33  branching    +-----------------+-------------+----------+--------------+ Dist upper arm       0.28                              +-----------------+-------------+----------+--------------+ Antecubital fossa    0.19                 branching    +-----------------+-------------+----------+--------------+ Prox forearm                            not visualized +-----------------+-------------+----------+--------------+ Mid forearm                             not visualized +-----------------+-------------+----------+--------------+ Distal forearm                          not visualized +-----------------+-------------+----------+--------------+ Wrist                                   not visualized +-----------------+-------------+----------+--------------+ +-----------------+-------------+----------+--------+ Left Cephalic    Diameter (cm)Depth (cm)Findings +-----------------+-------------+----------+--------+ Shoulder             0.34        0.45            +-----------------+-------------+----------+--------+ Prox upper arm       0.35        0.36            +-----------------+-------------+----------+--------+ Mid upper arm        0.33        0.28            +-----------------+-------------+----------+--------+ Dist upper arm       0.33        0.36            +-----------------+-------------+----------+--------+ Antecubital fossa    0.38        0.32            +-----------------+-------------+----------+--------+ Prox forearm         0.23        0.72            +-----------------+-------------+----------+--------+  Mid forearm          0.22        0.30            +-----------------+-------------+----------+--------+ Dist forearm         0.20        0.36            +-----------------+-------------+----------+--------+ Wrist                0.16        0.26            +-----------------+-------------+----------+--------+ +-----------------+-------------+----------+--------------+ Left Basilic     Diameter (cm)Depth (cm)   Findings    +-----------------+-------------+----------+--------------+ Prox upper arm       0.26                              +-----------------+-------------+----------+--------------+ Mid upper arm        0.23                              +-----------------+-------------+----------+--------------+ Dist upper arm  0.30                              +-----------------+-------------+----------+--------------+ Antecubital fossa    0.21                 branching    +-----------------+-------------+----------+--------------+ Prox forearm                            not visualized +-----------------+-------------+----------+--------------+ Mid forearm                             not visualized +-----------------+-------------+----------+--------------+ Distal forearm                          not visualized +-----------------+-------------+----------+--------------+ Wrist                                   not visualized +-----------------+-------------+----------+--------------+ *See table(s) above for measurements and observations.  Diagnosing physician: Harold Barban MD Electronically signed by Harold Barban MD on 12/30/2019 at 5:09:24 PM.    Final    CT Angio Abd/Pel w/ and/or w/o  Result Date: 12/31/2019 CLINICAL DATA:  79 year old male with history of severe aortic stenosis. Preprocedural evaluation prior to potential transcatheter aortic valve replacement (TAVR). EXAM: CT ANGIOGRAPHY CHEST, ABDOMEN AND PELVIS TECHNIQUE: Non-contrast CT of the chest  was initially obtained. Multidetector CT imaging through the chest, abdomen and pelvis was performed using the standard protocol during bolus administration of intravenous contrast. Multiplanar reconstructed images and MIPs were obtained and reviewed to evaluate the vascular anatomy. CONTRAST:  141mL OMNIPAQUE IOHEXOL 350 MG/ML SOLN COMPARISON:  CT the abdomen and pelvis 10/29/2017. Chest CTA 06/07/2009. FINDINGS: CTA CHEST FINDINGS Cardiovascular: Heart size is mildly enlarged. Small amount of pericardial fluid and/or thickening. No pericardial calcifications. There is aortic atherosclerosis, as well as atherosclerosis of the great vessels of the mediastinum and the coronary arteries, including calcified atherosclerotic plaque in the left main, left anterior descending, left circumflex and right coronary arteries. Severe thickening calcification of the aortic valve. Calcifications of the mitral annulus. Mediastinum/Lymph Nodes: No pathologically enlarged mediastinal or hilar lymph nodes. Esophagus is unremarkable in appearance. No axillary lymphadenopathy. Lungs/Pleura: Moderate bilateral pleural effusions. Left pleural effusion is completely simple lying dependently. Right pleural effusion is predominantly dependently located, although there is a small loculated component in the periphery of the anterolateral right hemithorax (axial image 69 of series 4). These are associated with areas of passive subsegmental atelectasis in the lower lobes of the lungs bilaterally. No acute consolidative airspace disease. No suspicious appearing pulmonary nodules or masses are noted. Musculoskeletal/Soft Tissues: There are no aggressive appearing lytic or blastic lesions noted in the visualized portions of the skeleton. CTA ABDOMEN AND PELVIS FINDINGS Hepatobiliary: Liver has a slightly shrunken appearance and nodular contour, suggesting underlying cirrhosis. Two somewhat ill-defined areas of hypervascularity are noted in the  central aspect of the liver, one adjacent to the gallbladder fossa (axial image 112 of series 4) measuring 1.6 x 0.9 cm, and the other in segment 4A of the liver (axial image 97 of series 4) measuring 1.4 x 1.2 cm. No intra or extrahepatic biliary ductal dilatation. Status post cholecystectomy. Pancreas: No pancreatic mass. No pancreatic ductal dilatation. No well-defined pancreatic or peripancreatic fluid collections to suggest pseudocyst.  Spleen: Unremarkable. Adrenals/Urinary Tract: Bilateral kidneys and adrenal glands are normal in appearance. No hydroureteronephrosis. Urinary bladder is partially obscured by beam hardening artifact from the patient's left hip arthroplasty, but visualized portions are unremarkable. Stomach/Bowel: Fluid adjacent to the greater curvature of the proximal stomach, presumably some fluid tracking in the gastrosplenic ligament. Otherwise, stomach is unremarkable in appearance. No pathologic dilatation of small bowel or colon. Several small bowel diverticulae are noted in the duodenum and proximal jejunum, measuring up to 2.5 x 2.1 cm in the proximal jejunum (axial image 125 of series 4), without surrounding inflammatory changes to suggest an associated diverticulitis at this time. 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 Normal appendix. Vascular/Lymphatic: Aortic atherosclerosis with vascular findings and measurements pertinent to potential TAVR procedure, as detailed below. Fusiform aneurysmal dilatation of the infrarenal abdominal aorta measuring up to 4.7 x 4.2 cm. No lymphadenopathy noted in the abdomen or pelvis. Reproductive: Prostate gland and seminal vesicles are unremarkable in appearance. Other: Small volume of ascites.  No pneumoperitoneum. Musculoskeletal: Status post PLIF from L3-L5 with interbody grafts at L3-L4 and L4-L5. Status post left hip arthroplasty. 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 There are no aggressive appearing lytic or blastic lesions noted in the visualized portions of the skeleton. VASCULAR MEASUREMENTS PERTINENT TO TAVR: AORTA: Minimal Aortic Diameter-20 x 17 mm Severity of Aortic Calcification-moderate to severe RIGHT PELVIS: Right Common Iliac Artery - Minimal Diameter-12.0 x 10.2 mm Tortuosity-mild Calcification-moderate Right External Iliac Artery - Minimal Diameter-7.0 x 7.4 mm Tortuosity-moderate Calcification-mild Right Common Femoral Artery - Minimal Diameter-7.8 x 6.6 mm Tortuosity-mild Calcification-moderate LEFT PELVIS: Left Common Iliac Artery - Minimal Diameter-10.8 x 11.3 mm Tortuosity-mild Calcification-mild Left External Iliac Artery - Minimal Diameter-7.8 x 6.6 mm Tortuosity-moderate Calcification-none Left Common Femoral Artery - Minimal Diameter-7.0 x 7.5 mm Tortuosity-mild Calcification-moderate Review of the MIP images confirms the above findings. IMPRESSION: 1. Vascular findings and measurements pertinent to potential TAVR procedure, as detailed above. 2. Severe thickening calcification of the aortic valve, compatible with reported clinical history of severe aortic stenosis. 3. Aortic atherosclerosis, in addition to left main and 3 vessel coronary artery disease. There is also fusiform aneurysmal dilatation of the infrarenal abdominal aorta which measures up to 4.7 x 4.2 cm in diameter. Recommend followup by abdomen and pelvis CTA in 6 months, and vascular surgery referral/consultation if not already obtained. This recommendation follows ACR consensus guidelines: White Paper of the ACR Incidental Findings Committee II on Vascular Findings. J Am Coll Radiol 2013; 10:789-794. Aortic aneurysm NOS (ICD10-I71.9). 4. Mild cardiomegaly. 5. Moderate bilateral pleural effusions with partial loculation of a small portion of the right pleural effusion. 6.  Morphologic changes in the liver suggestive of underlying cirrhosis with 2 ill-defined areas of hypervascularity. These may simply represent benign perfusion anomalies, however, given the cirrhotic changes, further evaluation with nonemergent abdominal MRI with and without IV gadolinium is strongly recommended in the near future to exclude underlying hepatocellular carcinoma. Correlation with AFP levels is also recommended. 7. Small volume of ascites. 8. Colonic and small bowel diverticulosis without evidence of acute diverticulitis at this time. 9. Additional incidental findings, as above. Electronically Signed   By: Vinnie Langton M.D.   On: 12/31/2019 08:23    Cardiac Studies   Cardiac Cath 12/17/2019: Conclusion      Previously placed Prox LAD to Mid LAD stent (unknown type) is  widely patent.  2nd Diag-1 lesion is 80% stenosed.  Previously placed 2nd Diag-2 stent (unknown type) is widely patent.  Hemodynamic findings consistent with aortic valve stenosis.  Ost LM lesion is 40% stenosed.  Dist LAD lesion is 60% stenosed.   Echo 12/09/2019: IMPRESSIONS     1. There is severe aortic stenosis, and at least moderate aortic regurgitation. Gradients across AoV partially related to regurgitation (LVOT VTI 27 cm). V max 4.2 m/s, MG 41 mmHG, AVA 0.95 cm2, DI 0.25. The aortic valve is tricuspid. Aortic valve regurgitation is moderate. Severe aortic valve stenosis. Aortic valve area, by VTI measures 0.95 cm. Aortic valve mean gradient measures 41.0 mmHg. Aortic valve Vmax measures 4.23 m/s.   2. Left ventricular ejection fraction, by estimation, is 60 to 65%. The left ventricle has normal function. The left ventricle has no regional wall motion abnormalities. There is mild concentric left ventricular hypertrophy. Left ventricular diastolic parameters are consistent with Grade I diastolic dysfunction (impaired relaxation).   3. Right ventricular systolic function is normal. The right ventricular  size is normal. There is mildly elevated pulmonary artery systolic pressure. The estimated right ventricular systolic pressure is 41.3 mmHg.   4. Left atrial size was severely dilated.   5. Right atrial size was mildly dilated.   6. Moderate pericardial effusion. The pericardial effusion is circumferential. There is no evidence of cardiac tamponade.   7. The mitral valve is degenerative. Mild mitral valve regurgitation. No evidence of mitral stenosis.   8. The inferior vena cava is normal in size with <50% respiratory variability, suggesting right atrial pressure of 8 mmHg.     Patient Profile     79 y.o. male with severe AS/moderate AI, CAD with remote PCI of the LAD and diagonal branches, hypertension, mixed hyperlipidemia, stage IV chronic kidney disease, anemia, moderate carotid atherosclerosis, and abdominal aortic aneurysm (4.5 cm), right renal artery stenosis, preserved LVEF, admitted with angina, without evidence of ACS.   Assessment & Plan    1.  CAD: Recent cath approximately 2 weeks ago with only significant lesion being an 80% second diagonal artery stenosis. Recent nuclear study prior to cath w fixed inferior defect, no ischemia. On this admission with anginal symptoms, minimal increase in troponin, flat pattern. IV NTG weaned off with the addition of beta blocker and Imdur. Hydralazine dose decreased. Stable at this time.  2. AS: Severe. No additional anginal symptoms today. Pre TAVR CTA and [place] completed. Outpatient follow up with [facility] scheduled.  3. CHF: Hemodynamically compensated clinically and at recent cath (mean PAWP 10). He has an order for amyloid Tc33m-PYP SPECT; plan for outpatient scheduling. No significant hypertrophy noted on echo.  4. CKD 4: Nephrology on board. AV fistula placed yesterday without complication. Mild increase in creatinine overnight.  5. AFib: Continues to be in A. Fib this AM but rate controlled with Metoprolol (added this admission.) Continue with  current dose on discharge. Additionally, will discharge on Eliquis 2.5 mg BID.  Stable for discharge today with close outpatient follow up.      For questions or updates, please contact [place] Please consult [email or web address] for contact info under    Dr. Jose Persia Internal Medicine PGY-2   01/01/2020, 8:49 AM  I have seen and examined the patient along with Dr. Jose Persia.  I have reviewed the chart, notes and new data.  I agree with her note.  Key new complaints: no angina, no dyspnea, eager to leave Key examination changes: trivial ankle edema,  no JVD, clear lungs, unchanged AS murmur and carotid bruits. Remains in AFib. Excellent thrill over AVF, no bleeding. Key new findings / data: rate well controlled on metoprolol. Slight increase in creatinine, but pretty much at his baseline.  PLAN:  Ready for DC from Cardiology point of view.  [facility] will sign off.   Medication Recommendations:   - stop aspirin - clopidogrel 75 mg daily - apixaban 2.5 mg twice daily - metoprolol 25 mg twice daily - isosorbide mononitrate 30 mg twice daily - hydralazine 25 mg three times daily - amlodipine 10 mg daily - pravastatin 40 mg evening daily Other recommendations (labs, testing, etc):  Recheck BMET next week; outpatient Tc13m-PYP scan. Daily weights . Call for weight gain >3 lb/24h or at any point if 5 lb over discharge weight. Follow up as an outpatient:  Has appt to discuss TAVR with Dr. Roxy Manns later this month. Has F/U w Dr. Trula Slade for evaluation of AV fistula in August. Will arrange follow up with Dr. Burt Knack as well.   Sanda Klein, MD, [place] [phone] 01/01/2020, 10:43 AM

## 2020-01-01 NOTE — Care Management Important Message (Signed)
Important Message  Patient Details  Name: FATIH STALVEY MRN: [id number] Date of Birth: May 08, 1941   Medicare Important Message Given:  Yes     Shelda Altes 01/01/2020, 8:24 AM

## 2020-01-01 NOTE — Discharge Instructions (Signed)
[facility]  Discharge Instructions  AV Fistula or Graft Surgery for Dialysis Access  Please refer to the following instructions for your post-procedure care. Your surgeon or physician assistant will discuss any changes with you.  Activity  You may drive the day following your surgery, if you are comfortable and no longer taking prescription pain medication. Resume full activity as the soreness in your incision resolves.  Bathing/Showering  You may shower after you go home. Keep your incision dry for 48 hours. Do not soak in a bathtub, hot tub, or swim until the incision heals completely. You may not shower if you have a hemodialysis catheter.  Incision Care  Clean your incision with mild soap and water after 48 hours. Pat the area dry with a clean towel. You do not need a bandage unless otherwise instructed. Do not apply any ointments or creams to your incision. You may have skin glue on your incision. Do not peel it off. It will come off on its own in about one week. Your arm may swell a bit after surgery. To reduce swelling use pillows to elevate your arm so it is above your heart. Your doctor will tell you if you need to lightly wrap your arm with an ACE bandage.  Diet  Resume your normal diet. There are not special food restrictions following this procedure. In order to heal from your surgery, it is CRITICAL to get adequate nutrition. Your body requires vitamins, minerals, and protein. Vegetables are the best source of vitamins and minerals. Vegetables also provide the perfect balance of protein. Processed food has little nutritional value, so try to avoid this.  Medications  Resume taking all of your medications. If your incision is causing pain, you may take over-the counter pain relievers such as acetaminophen (Tylenol). If you were prescribed a stronger pain medication, please be aware these medications can cause nausea and constipation. Prevent  nausea by taking the medication with a snack or meal. Avoid constipation by drinking plenty of fluids and eating foods with high amount of fiber, such as fruits, vegetables, and grains.  Do not take Tylenol if you are taking prescription pain medications.  Follow up Your surgeon may want to see you in the office following your access surgery. If so, this will be arranged at the time of your surgery.  Please call us immediately for any of the following conditions:  . Increased pain, redness, drainage (pus) from your incision site . Fever of 101 degrees or higher . Severe or worsening pain at your incision site . Hand pain or numbness. .  Reduce your risk of vascular disease:  . Stop smoking. If you would like help, call [facility] at 1-800-QUIT-NOW (364)152-9757) or [place] at [phone]  . Manage your cholesterol . Maintain a desired weight . Control your diabetes . Keep your blood pressure down  Dialysis  It will take several weeks to several months for your new dialysis access to be ready for use. Your surgeon will determine when it is okay to use it. Your nephrologist will continue to direct your dialysis. You can continue to use your Permcath until your new access is ready for use.   12/31/2019 Terry Garner [id number] 07/24/1940  Surgeon(s): Serafina Mitchell, MD  Procedure(s): LEFT ARM BRACHIOCEPHALIC ARTERIOVENOUS (AV) FISTULA CREATION   May stick graft immediately   May stick graft on designated area only:   X Do not stick left AV fistula for 12 weeks  If you have any questions, please call the office at [phone]. ===========================================================================  Information on my medicine - ELIQUIS (apixaban)  This medication education was reviewed with me or my healthcare representative as part of my discharge preparation.   Why was Eliquis prescribed for you? Eliquis was prescribed for you to reduce the risk of a  blood clot forming that can cause a stroke if you have a medical condition called atrial fibrillation (a type of irregular heartbeat).  What do You need to know about Eliquis ? Take your Eliquis TWICE DAILY - one tablet in the morning and one tablet in the evening with or without food. If you have difficulty swallowing the tablet whole please discuss with your pharmacist how to take the medication safely.  Take Eliquis exactly as prescribed by your doctor and DO NOT stop taking Eliquis without talking to the doctor who prescribed the medication.  Stopping may increase your risk of developing a stroke.  Refill your prescription before you run out.  After discharge, you should have regular check-up appointments with your healthcare provider that is prescribing your Eliquis.  In the future your dose may need to be changed if your kidney function or weight changes by a significant amount or as you get older.  What do you do if you miss a dose? If you miss a dose, take it as soon as you remember on the same day and resume taking twice daily.  Do not take more than one dose of ELIQUIS at the same time to make up a missed dose.  Important Safety Information A possible side effect of Eliquis is bleeding. You should call your healthcare provider right away if you experience any of the following: ? Bleeding from an injury or your nose that does not stop. ? Unusual colored urine (red or dark brown) or unusual colored stools (red or black). ? Unusual bruising for unknown reasons. ? A serious fall or if you hit your head (even if there is no bleeding).  Some medicines may interact with Eliquis and might increase your risk of bleeding or clotting while on Eliquis. To help avoid this, consult your healthcare provider or pharmacist prior to using any new prescription or non-prescription medications, including herbals, vitamins, non-steroidal anti-inflammatory drugs (NSAIDs) and supplements.  This  website has more information on Eliquis (apixaban): http://www.eliquis.com/eliquis/home

## 2020-01-01 NOTE — Discharge Summary (Addendum)
Physician Discharge Summary  Terry Garner:[id number] DOB: 16-Aug-1940 DOA: 12/28/2019  PCP: Carlena Hurl, PA-C  Admit date: 12/28/2019 Discharge date: 01/01/2020  Admitted From: home Disposition:  home   Recommendations for Outpatient Follow-up:  1. F/u on imaging which suggest cirrhosis of the liver 2. F/u on Hb  Home Health:  none Discharge Condition:  stable   CODE STATUS:  Full code   Diet recommendation:  Heart helthy Consultations:  Nephrology  Renal  Vascular surgery  Procedures/Studies: . Fistula in left arm   Discharge Diagnoses:  Principal Problem:   Angina pectoris ([place]) Active Problems: Severe aortic stenosis   Coronary artery disease of native artery of native heart with stable angina pectoris (HCC) Anemia of chronic disease A-fib with RVR- new diagnosis   Hyperlipidemia   Uncontrolled hypertension   Abdominal aortic aneurysm (HCC)   CKD (chronic kidney disease) stage 4, GFR 15-29 ml/min (HCC)     Brief Summary: Terry Garner 79 y.o.malewith medical history significant ofCAD with remote PCI of the LAD and diagonal branches, HTN, mixed hyperlipidemia, anemia (Hgb 8-9)stage IV CKD, moderate carotid atherosclerosis,renal artery stenosis, RBBB,and abdominal aortic aneurysm, and recently discovered severe aortic stenosis who is being evaluated for chest pain.This morning at 11AM he had sudden onset chest pain while at rest. It was 10/10 and non-radiating and no associated symptoms.Denies recent fever, chills. He has chronic LLE which is unchanged. Denies weight gain. No worsening orthopnea or sob on exertion. He came to the ED when it persisted   ED Course:In the ED BP 137/55, pulse 64, afebrile, RR 19, 98% O2. Labs showed creatinine 3.15, BUN 58, calcium 8.2, Hgb 8.2. Albumin 2.9. AST 45, HS troponin 50>48. CXR showed small B/L pleural effusions, however small than 09/25/2017, increased opacity in the left lung base, atelectasis vs infection.EKG  nonacute. In the ED he was given IV morphine and IV nitro  Hospital Course:  Principal Problem:   Chest pain  -  Coronary artery disease of native artery of native heart with stable angina pectoris - improved after Nitro infusion - Troponin barely elevated at 50 and 48 - dc ASA per cardiology and cont Plavix & Metoprolol -Nitro infusion has been transition to Imdur at a dose of 30 mg BID- no further chest pain  Active Problems:    Severe aortic stenosis - undergoing assessment for TAVR and will have outpatient f/u with TCTS  A-fib with RVR - noted on 7/7 - He has been started on Metoprolol and low dose Eliquis - 2.5 mg BID  Anemia, normocytic - Hb > 8.0 > 7.1 > 6.8- I am unable to determine the reason for this drop in Hb- no bleeding noted - it seems that his baseline is 7.4-8.0 -  1 U PRBC ordered by night cover- Hb improved to 8.6 after transfusion and is 7.8 today - anemia panel reveals low Iron and Iron saturation but normal Ferritin- Vit B12 is low normal- will start oral replacement - he has received IV Iron and Aranesp during this hospital stay- please f/u Hb as outpatient   CKD (chronic kidney disease) stage 4, GFR 15-29 ml/min (HCC) - vein mapping completed and AV fistula created 7/8 -   nephrology following and assisting with management  AAA - 4 cm  - cont surveillance - next CT imaging should be done in 6 months  ? Cirrhosis - noted on CT imaging during this hospital stay> "Liver has a slightly shrunken appearance and nodular" contour, suggesting underlying  cirrhosis will need outpatient follow up to confirm this   Discharge Exam: Vitals:   01/01/20 0300 01/01/20 0952  BP: (!) 138/55 130/74  Pulse: 73 78  Resp: 19   Temp: 98.2 F (36.8 C)   SpO2: 94% 97%   Vitals:   12/31/19 2104 01/01/20 0246 01/01/20 0300 01/01/20 0952  BP: 139/67  (!) 138/55 130/74  Pulse:   73 78  Resp:   19   Temp:   98.2 F (36.8 C)   TempSrc:   Oral   SpO2:   94% 97%   Weight:  74.8 kg    Height:        General: Pt is alert, awake, not in acute distress Cardiovascular: RRR, S1/S2 +, no rubs, no gallops- 2/6 murmur in RUS border Respiratory: CTA bilaterally, no wheezing, no rhonchi Abdominal: Soft, NT, ND, bowel sounds + Extremities: no edema, no cyanosis   Discharge Instructions  Discharge Instructions    (HEART FAILURE PATIENTS) Call MD:  Anytime you have any of the following symptoms: 1) 3 pound weight gain in 24 hours or 5 pounds in 1 week 2) shortness of breath, with or without a dry hacking cough 3) swelling in the hands, feet or stomach 4) if you have to sleep on extra pillows at night in order to breathe.   Complete by: As directed    Diet - low sodium heart healthy   Complete by: As directed    Increase activity slowly   Complete by: As directed    No wound care   Complete by: As directed      Allergies as of 01/01/2020      Reactions   Toprol Xl [metoprolol Tartrate] Other (See Comments)   Bradycardia with beta blockers   Lipitor [atorvastatin]    Myalgias   Coumadin [warfarin Sodium] Rash   Tape Rash   Clear tape   Warfarin Rash      Medication List    STOP taking these medications   aspirin EC 81 MG tablet     TAKE these medications   allopurinol 300 MG tablet Commonly known as: ZYLOPRIM TAKE 1 TABLET BY MOUTH ONCE DAILY Notes to patient: To prevent gout   amLODipine 10 MG tablet Commonly known as: NORVASC Take 1 tablet (10 mg total) by mouth daily. Notes to patient: To lower blood pressure   apixaban 2.5 MG Tabs tablet Commonly known as: ELIQUIS Take 1 tablet (2.5 mg total) by mouth 2 (two) times daily. Notes to patient: **NEW** To prevent clot   brimonidine 0.15 % ophthalmic solution Commonly known as: ALPHAGAN Place 1 drop into both eyes 2 (two) times daily. Notes to patient: For cataracts/glaucoma   clopidogrel 75 MG tablet Commonly known as: PLAVIX TAKE 1 TABLET BY MOUTH ONCE DAILY FOR HEART  STENTS What changed: See the new instructions. Notes to patient: To preven clot   dorzolamide 2 % ophthalmic solution Commonly known as: TRUSOPT Place 1 drop into the right eye 2 (two) times daily. Notes to patient: For cataracts/glaucoma   fenofibrate micronized 134 MG capsule Commonly known as: LOFIBRA TAKE 1 CAPSULE BY MOUTH ONCE DAILY What changed: when to take this Notes to patient: To lower cholesterol   ferrous sulfate 325 (65 FE) MG tablet Take 325 mg by mouth daily with breakfast. Notes to patient: Iron supplement   furosemide 40 MG tablet Commonly known as: LASIX Take 40 mg by mouth daily. Notes to patient: To prevent fluid build up   hydrALAZINE  25 MG tablet Commonly known as: APRESOLINE Take 1 tablet (25 mg total) by mouth 3 (three) times daily. What changed:   medication strength  how much to take  additional instructions Notes to patient: **DOSE DECREASED** To lower blood pressure   Ilevro 0.3 % ophthalmic suspension Generic drug: nepafenac Place 1 drop into the right eye at bedtime. Notes to patient: For cataracts/glaucoma   isosorbide mononitrate 30 MG 24 hr tablet Commonly known as: IMDUR Take 1 tablet (30 mg total) by mouth 2 (two) times daily. What changed:   how much to take  when to take this Notes to patient: **DOSE INCREASED** To lower blood pressure and prevent chest pain   ketorolac 0.5 % ophthalmic solution Commonly known as: ACULAR Place 1 drop into the right eye daily as needed (itching eye). Notes to patient: For cataracts/glaucoma   metoprolol tartrate 25 MG tablet Commonly known as: LOPRESSOR Take 1 tablet (25 mg total) by mouth 2 (two) times daily. Notes to patient: **NEW** To lower heart rate   pravastatin 40 MG tablet Commonly known as: PRAVACHOL TAKE 1 TABLET BY MOUTH AT BEDTIME FOR CHOLESTEROL What changed: See the new instructions. Notes to patient: To lower cholesterol   triamcinolone cream 0.1 % Commonly known  as: KENALOG Apply 1 application topically daily as needed (rash on Scalp). Notes to patient: For rash   trimethoprim-polymyxin b ophthalmic solution Commonly known as: POLYTRIM Place 1 drop into the right eye See admin instructions. Monthly after injection in right eye four times a day Notes to patient: To prevent eye infection   Vitamin D3 125 MCG (5000 UT) Tabs Take 5,000 Units by mouth daily. Notes to patient: Supplement       Follow-up Information    Serafina Mitchell, MD.   Specialties: Vascular Surgery, Cardiology Why: 4-6 weeks. , The office will call the patient with an appointment (sent) Contact information: [address] [place] [address] [phone]        Rexene Alberts, MD. Go on 01/18/2020.   Specialty: Cardiothoracic Surgery Why: @ 2:30pm to discuss TAVR. Please see your dentist between now and then to make sure your teeth are in good shape. Contact information: [address] [place] [address] [phone]              Allergies  Allergen Reactions  . Toprol Xl [Metoprolol Tartrate] Other (See Comments)    Bradycardia with beta blockers  . Lipitor [Atorvastatin]     Myalgias   . Coumadin [Warfarin Sodium] Rash  . Tape Rash    Clear tape  . Warfarin Rash      DG Orthopantogram  Result Date: 12/30/2019 CLINICAL DATA:  Upcoming heart surgery EXAM: ORTHOPANTOGRAM/PANORAMIC COMPARISON:  None. FINDINGS: Extensive dental hardware is noted. No definitive periapical lucencies are identified to suggest abscess formation. No significant acute dental caries are noted at this time. No bony abnormality is seen. IMPRESSION: Changes of prior dental hardware. No acute care ease or periapical abscesses are seen Electronically Signed   By: Inez Catalina M.D.   On: 12/30/2019 21:15   DG Chest 2 View  Result Date: 12/28/2019 CLINICAL DATA:  Chest pain.  Eight a 10 pain constant nonradiating EXAM: CHEST - 2 VIEW COMPARISON:  09/25/2017  FINDINGS: Small bilateral pleural effusions are smaller than on the study of 09/25/2017. Obscured LEFT hemidiaphragm. Cardiomediastinal contours are stable.  Heart size mildly enlarged. On limited assessment visualized skeletal structures are unremarkable. IMPRESSION: Small bilateral pleural effusions  are smaller than on 09/25/2017. There is increased opacity at the LEFT lung base perhaps in the lingula that may reflect atelectasis or developing infection. Electronically Signed   By: Zetta Bills M.D.   On: 12/28/2019 15:02   CARDIAC CATHETERIZATION  Result Date: 12/17/2019  Previously placed Prox LAD to Mid LAD stent (unknown type) is widely patent.  2nd Diag-1 lesion is 80% stenosed.  Previously placed 2nd Diag-2 stent (unknown type) is widely patent.  Hemodynamic findings consistent with aortic valve stenosis.  Ost LM lesion is 40% stenosed.  Dist LAD lesion is 60% stenosed.  Terry Garner is a 79 y.o. male  [id number] LOCATION:  FACILITY: [facility] PHYSICIAN: Quay Burow, M.D. June 22, 1941 DATE OF PROCEDURE:  12/17/2019 DATE OF DISCHARGE: CARDIAC CATHETERIZATION History obtained from chart review.Terry Garner is a 79 y.o.  moderately overweight married Caucasian male father of one child formally a patient of Dr. Carlean Jews. [facility]. I last saw 11/13/2019. He has a history of CAD status post LAD and diagonal branch stenting back in 2006, 2007 and 2008. He underwent cardiac catheterization by Dr. Nona Dell 06/09/09 revealing widely patent stents after a false positive Myoview. His other problems include chronic branch block, treated hypertension and hyperlipidemia. He denies chest pain or shortness of breath. 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 Since I saw him a year ago he  remains completely stable. He has had an issue with his gallbladder and bile leak and has had multiple procedures for this. In addition, his abdominal ultrasound performed 08/28/2017 shows an increase in his abdominal dimensions from 3.5 to 4.5 cm.   He has developed increasing effort angina. His blood pressures been difficult to control as well. His Doppler studies of his kidney arteries performed 06/30/2019 revealed progression of his right renal artery stenosis.  I performed Myoview stress test that was nonischemic on 11/26/2019 a 2D echocardiogram on 12/09/2019 that showed severe aortic stenosis with moderate AI and a preserved EF.  He also had a moderate pericardial effusion without tamponade physiology.  He presents today for right left heart cath to define his anatomy and physiology. Distal abdominal aortography-distally abdominal aortogram revealed moderate infrarenal abdominal aortic aneurysm with light widely patent though tortuous iliac arteries.   Mr. Williemae Natter LAD and diagonal branch stents are patent.  He does have at least 80% ostial stenosis of D2 which is a small to moderate size vessel although I do not think this is contributing to his symptoms.  The remainder of his coronary anatomy are free of significant disease.  It was difficult crossing his aortic valve with a right Judkins catheter and a straight wire although I finally was able to cross but surprisingly the peak to peak pullback gradient was only 14 mmHg.  I did give the patient hydralazine 10 mg IV for elevated systolic blood pressure.  Is a total of 65 cc of contrast with my max GFR total of being 57 cc.  I did perform abdominal aortography which did reveal a moderate size infrarenal abdominal uric aneurysm and widely patent iliac arteries.  Is a total of 65 cc of contrast, only slightly above his GFR max limit.  Given his severe elevated hypertension, age and aortic stenosis I am electing to keep him overnight for observation and  hydration.  The sheath was removed  and pressure held.  The patient left lab in stable condition.  Plan will be for discharge home in the morning with close outpatient follow-up. Quay Burow. MD, [facility] 12/17/2019 3:16 PM   PERIPHERAL VASCULAR CATHETERIZATION  Result Date: 12/17/2019  Previously placed Prox LAD to Mid LAD stent (unknown type) is widely patent.  2nd Diag-1 lesion is 80% stenosed.  Previously placed 2nd Diag-2 stent (unknown type) is widely patent.  Hemodynamic findings consistent with aortic valve stenosis.  Ost LM lesion is 40% stenosed.  Dist LAD lesion is 60% stenosed.  Terry Garner is a 79 y.o. male  [id number] LOCATION:  FACILITY: [facility] PHYSICIAN: Quay Burow, M.D. Nov 03, 1940 DATE OF PROCEDURE:  12/17/2019 DATE OF DISCHARGE: CARDIAC CATHETERIZATION History obtained from chart review.Terry Garner is a 79 y.o.  moderately overweight married Caucasian male father of one child formally a patient of Dr. Carlean Jews. [facility]. I last saw 11/13/2019. He has a history of CAD status post LAD and diagonal branch stenting back in 2006, 2007 and 2008. He underwent cardiac catheterization by Dr. Nona Dell 06/09/09 revealing widely patent stents after a false positive Myoview. His other problems include chronic branch block, treated hypertension and hyperlipidemia. He denies chest pain or shortness of breath. 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 Since I saw him a year ago he remains completely stable. He has had an issue with his gallbladder and bile leak and has had multiple procedures for this. In addition, his abdominal ultrasound performed 08/28/2017 shows an increase in his abdominal dimensions from 3.5 to 4.5 cm.   He has developed increasing effort angina. His  blood pressures been difficult to control as well. His Doppler studies of his kidney arteries performed 06/30/2019 revealed progression of his right renal artery stenosis.  I performed Myoview stress test that was nonischemic on 11/26/2019 a 2D echocardiogram on 12/09/2019 that showed severe aortic stenosis with moderate AI and a preserved EF.  He also had a moderate pericardial effusion without tamponade physiology.  He presents today for right left heart cath to define his anatomy and physiology. Distal abdominal aortography-distally abdominal aortogram revealed moderate infrarenal abdominal aortic aneurysm with light widely patent though tortuous iliac arteries.   Mr. Williemae Natter LAD and diagonal branch stents are patent.  He does have at least 80% ostial stenosis of D2 which is a small to moderate size vessel although I do not think this is contributing to his symptoms.  The remainder of his coronary anatomy are free of significant disease.  It was difficult crossing his aortic valve with a right Judkins catheter and a straight wire although I finally was able to cross but surprisingly the peak to peak pullback gradient was only 14 mmHg.  I did give the patient hydralazine 10 mg IV for elevated systolic blood pressure.  Is a total of 65 cc of contrast with my max GFR total of being 57 cc.  I did perform abdominal aortography which did reveal a moderate size infrarenal abdominal uric aneurysm and widely patent iliac arteries.  Is a total of 65 cc of contrast, only slightly above his GFR max limit.  Given his severe elevated hypertension, age and aortic stenosis I am electing to keep him overnight for observation and hydration.  The sheath was removed and pressure held.  The patient left  lab in stable condition.  Plan will be for discharge home in the morning with close outpatient follow-up. Quay Burow. MD, [facility] 12/17/2019 3:16 PM   US AORTA  Result Date: 12/30/2019 CLINICAL DATA:  Known abdominal aortic aneurysm  EXAM: ULTRASOUND OF ABDOMINAL AORTA TECHNIQUE: Ultrasound examination of the abdominal aorta and proximal common iliac arteries was performed to evaluate for aneurysm. Additional color and Doppler images of the distal aorta were obtained to document patency. COMPARISON:  CT angiography 12/30/2019 FINDINGS: Abdominal aortic measurements as follows: Proximal:  3.0 x 3.0 cm Mid:  3.0 x 3.1 cm Distal:  3.8 x 4.1 cm Patent: Yes, peak systolic velocity is 59.7 cm/s Right common iliac artery: 0.8 x 1.1 cm Left common iliac artery: 1.0 x 1.0 cm IMPRESSION: Redemonstration of the infrarenal abdominal aortic aneurysm measuring up to 4.1 cm in maximal diameter, much better characterized on the same day CT angiography of the chest, abdomen and pelvis. Electronically Signed   By: Lovena Le M.D.   On: 12/30/2019 21:01   CT CORONARY MORPH W/CTA COR W/SCORE W/CA W/CM &/OR WO/CM  Addendum Date: 12/31/2019   ADDENDUM REPORT: 12/31/2019 07:56 ADDENDUM: Extracardiac findings will be described separately under dictation for contemporaneously obtained CTA chest, abdomen and pelvis dated 12/30/2019. Please see that report for full description of relevant extracardiac findings. Electronically Signed   By: Vinnie Langton M.D.   On: 12/31/2019 07:56   Result Date: 12/31/2019 CLINICAL DATA:  79 year old male with severe aortic stenosis being evaluated for a TAVR procedure. EXAM: Cardiac TAVR CT TECHNIQUE: The patient was scanned on a [employer]. A 120 kV retrospective scan was triggered in the descending thoracic aorta at 111 HU's. Gantry rotation speed was 250 msecs and collimation was .6 mm. No beta blockade or nitro were given. The 3D data set was reconstructed in 5% intervals of the R-R cycle. Systolic and diastolic phases were analyzed on a dedicated work station using MPR, MIP and VRT modes. The patient received 80 cc of contrast. FINDINGS: Aortic Valve: Trileaflet aortic valve with moderately calcified leaflets and  moderately restricted leaflet opening. Only minimal calcifications are extending into the LVOT. Aortic valve calcium score is 1745. Aorta: Normal caliber with moderate diffuse atherosclerotic plaque and calcifications. No dissection. Sinotubular Junction: 30 x 28 mm Ascending Thoracic Aorta: 33 x 32 mm Aortic Arch: 28 x 26 mm Descending Thoracic Aorta: 27 x 25 mm Sinus of Valsalva Measurements: Non-coronary: 33 mm Right -coronary: 31 mm Left -coronary: 34 mm Coronary Artery Height above Annulus: Left Main: 14 mm Right Coronary: 15 mm Virtual Basal Annulus Measurements: Maximum/Minimum Diameter: 26.3 x 22.6 mm Mean Diameter: 24.2 mm Perimeter: 77.7 mm Area: 460 mm2 Optimum Fluoroscopic Angle for Delivery:  LAO 6 CAU 6 IMPRESSION: 1. Trileaflet aortic valve with moderately calcified leaflets and moderately restricted leaflet opening. Only minimal calcifications are extending into the LVOT. Aortic valve calcium score is 1745. Annular measurements are suitable for delivery of a 26 mm Edwards-SAPIEN 3 Ultra valve. 2. Sufficient coronary to annulus distance. 3. Optimum Fluoroscopic Angle for Delivery: LAO 6 CAU 6. 4. No thrombus in the left atrial appendage. 5. Dilated pulmonary artery measuring 35 mm. Electronically Signed: By: Ena Dawley On: 12/30/2019 20:24   CT ANGIO CHEST AORTA W/CM & OR WO/CM  Result Date: 12/31/2019 CLINICAL DATA:  79 year old male with history of severe aortic stenosis. Preprocedural evaluation prior to potential transcatheter aortic valve replacement (TAVR). EXAM: CT ANGIOGRAPHY CHEST, ABDOMEN AND PELVIS TECHNIQUE: Non-contrast CT of  the chest was initially obtained. Multidetector CT imaging through the chest, abdomen and pelvis was performed using the standard protocol during bolus administration of intravenous contrast. Multiplanar reconstructed images and MIPs were obtained and reviewed to evaluate the vascular anatomy. CONTRAST:  120mL OMNIPAQUE IOHEXOL 350 MG/ML SOLN COMPARISON:  CT  the abdomen and pelvis 10/29/2017. Chest CTA 06/07/2009. FINDINGS: CTA CHEST FINDINGS Cardiovascular: Heart size is mildly enlarged. Small amount of pericardial fluid and/or thickening. No pericardial calcifications. There is aortic atherosclerosis, as well as atherosclerosis of the great vessels of the mediastinum and the coronary arteries, including calcified atherosclerotic plaque in the left main, left anterior descending, left circumflex and right coronary arteries. Severe thickening calcification of the aortic valve. Calcifications of the mitral annulus. Mediastinum/Lymph Nodes: No pathologically enlarged mediastinal or hilar lymph nodes. Esophagus is unremarkable in appearance. No axillary lymphadenopathy. Lungs/Pleura: Moderate bilateral pleural effusions. Left pleural effusion is completely simple lying dependently. Right pleural effusion is predominantly dependently located, although there is a small loculated component in the periphery of the anterolateral right hemithorax (axial image 69 of series 4). These are associated with areas of passive subsegmental atelectasis in the lower lobes of the lungs bilaterally. No acute consolidative airspace disease. No suspicious appearing pulmonary nodules or masses are noted. Musculoskeletal/Soft Tissues: There are no aggressive appearing lytic or blastic lesions noted in the visualized portions of the skeleton. CTA ABDOMEN AND PELVIS FINDINGS Hepatobiliary: Liver has a slightly shrunken appearance and nodular contour, suggesting underlying cirrhosis. Two somewhat ill-defined areas of hypervascularity are noted in the central aspect of the liver, one adjacent to the gallbladder fossa (axial image 112 of series 4) measuring 1.6 x 0.9 cm, and the other in segment 4A of the liver (axial image 97 of series 4) measuring 1.4 x 1.2 cm. No intra or extrahepatic biliary ductal dilatation. Status post cholecystectomy. Pancreas: No pancreatic mass. No pancreatic ductal  dilatation. No well-defined pancreatic or peripancreatic fluid collections to suggest pseudocyst. Spleen: Unremarkable. Adrenals/Urinary Tract: Bilateral kidneys and adrenal glands are normal in appearance. No hydroureteronephrosis. Urinary bladder is partially obscured by beam hardening artifact from the patient's left hip arthroplasty, but visualized portions are unremarkable. Stomach/Bowel: Fluid adjacent to the greater curvature of the proximal stomach, presumably some fluid tracking in the gastrosplenic ligament. Otherwise, stomach is unremarkable in appearance. No pathologic dilatation of small bowel or colon. Several small bowel diverticulae are noted in the duodenum and proximal jejunum, measuring up to 2.5 x 2.1 cm in the proximal jejunum (axial image 125 of series 4), without surrounding inflammatory changes to suggest an associated diverticulitis at this time. 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 Normal appendix. Vascular/Lymphatic: Aortic atherosclerosis with vascular findings and measurements pertinent to potential TAVR procedure, as detailed below. Fusiform aneurysmal dilatation of the infrarenal abdominal aorta measuring up to 4.7 x 4.2 cm. No lymphadenopathy noted in the abdomen or pelvis. Reproductive: Prostate gland and seminal vesicles are unremarkable in appearance. Other: Small volume of ascites.  No pneumoperitoneum. Musculoskeletal: Status post PLIF from L3-L5 with interbody grafts at L3-L4 and L4-L5. Status post left hip arthroplasty. 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 There are no aggressive appearing lytic or blastic lesions noted in the visualized portions of the skeleton. VASCULAR MEASUREMENTS PERTINENT TO TAVR: AORTA: Minimal Aortic Diameter-20 x 17 mm Severity of Aortic Calcification-moderate to  severe RIGHT PELVIS: Right Common Iliac Artery -  Minimal Diameter-12.0 x 10.2 mm Tortuosity-mild Calcification-moderate Right External Iliac Artery - Minimal Diameter-7.0 x 7.4 mm Tortuosity-moderate Calcification-mild Right Common Femoral Artery - Minimal Diameter-7.8 x 6.6 mm Tortuosity-mild Calcification-moderate LEFT PELVIS: Left Common Iliac Artery - Minimal Diameter-10.8 x 11.3 mm Tortuosity-mild Calcification-mild Left External Iliac Artery - Minimal Diameter-7.8 x 6.6 mm Tortuosity-moderate Calcification-none Left Common Femoral Artery - Minimal Diameter-7.0 x 7.5 mm Tortuosity-mild Calcification-moderate Review of the MIP images confirms the above findings. IMPRESSION: 1. Vascular findings and measurements pertinent to potential TAVR procedure, as detailed above. 2. Severe thickening calcification of the aortic valve, compatible with reported clinical history of severe aortic stenosis. 3. Aortic atherosclerosis, in addition to left main and 3 vessel coronary artery disease. There is also fusiform aneurysmal dilatation of the infrarenal abdominal aorta which measures up to 4.7 x 4.2 cm in diameter. Recommend followup by abdomen and pelvis CTA in 6 months, and vascular surgery referral/consultation if not already obtained. This recommendation follows ACR consensus guidelines: White Paper of the ACR Incidental Findings Committee II on Vascular Findings. J Am Coll Radiol 2013; 10:789-794. Aortic aneurysm NOS (ICD10-I71.9). 4. Mild cardiomegaly. 5. Moderate bilateral pleural effusions with partial loculation of a small portion of the right pleural effusion. 6. Morphologic changes in the liver suggestive of underlying cirrhosis with 2 ill-defined areas of hypervascularity. These may simply represent benign perfusion anomalies, however, given the cirrhotic changes, further evaluation with nonemergent abdominal MRI with and without IV gadolinium is strongly recommended in the near future to exclude underlying  hepatocellular carcinoma. Correlation with AFP levels is also recommended. 7. Small volume of ascites. 8. Colonic and small bowel diverticulosis without evidence of acute diverticulitis at this time. 9. Additional incidental findings, as above. Electronically Signed   By: Vinnie Langton M.D.   On: 12/31/2019 08:23   ECHOCARDIOGRAM COMPLETE  Result Date: 12/09/2019    ECHOCARDIOGRAM REPORT   Patient Name:   Terry Garner Date of Exam: 12/09/2019 Medical Rec #:  [id number]       Height:       74.0 in Accession #:    [id number]      Weight:       166.0 lb Date of Birth:  [birth year]        BSA:          2.008 m Patient Age:    89 years        BP:           140/88 mmHg Patient Gender: M               HR:           60 bpm. Exam Location:  [facility] Procedure: 2D Echo, 3D Echo, Cardiac Doppler, Color Doppler and Strain Analysis Indications:    R07.9 Chest pain  History:        Patient has prior history of Echocardiogram examinations, most                 recent 09/24/2017. Risk Factors:Hypertension and Dyslipidemia.                 Murmur. Coronary artery disease. Chronic kidney disease. AAA.  Sonographer:    Wilford Sports Rodgers-Jones RDCS Referring Phys: [place]  1. There is severe aortic stenosis, and at least moderate aortic regurgitation. Gradients across AoV partially related to regurgitation (LVOT VTI 27 cm). V max 4.2 m/s, MG 41 mmHG, AVA 0.95 cm2, DI 0.25. The aortic valve is tricuspid. Aortic valve  regurgitation is moderate. Severe aortic valve stenosis. Aortic valve area, by VTI measures 0.95 cm. Aortic valve mean gradient measures 41.0 mmHg. Aortic valve Vmax measures 4.23 m/s.  2. Left ventricular ejection fraction, by estimation, is 60 to 65%. The left ventricle has normal function. The left ventricle has no regional wall motion abnormalities. There is mild concentric left ventricular hypertrophy. Left ventricular diastolic parameters are consistent with Grade I diastolic  dysfunction (impaired relaxation).  3. Right ventricular systolic function is normal. The right ventricular size is normal. There is mildly elevated pulmonary artery systolic pressure. The estimated right ventricular systolic pressure is 38.8 mmHg.  4. Left atrial size was severely dilated.  5. Right atrial size was mildly dilated.  6. Moderate pericardial effusion. The pericardial effusion is circumferential. There is no evidence of cardiac tamponade.  7. The mitral valve is degenerative. Mild mitral valve regurgitation. No evidence of mitral stenosis.  8. The inferior vena cava is normal in size with <50% respiratory variability, suggesting right atrial pressure of 8 mmHg. Comparison(s): Changes from prior study are noted. There is now a moderate pericardial effusion without signs of tamponade. Aortic stenosis is now severe with moderate regurgitation. EF remains normal. FINDINGS  Left Ventricle: Left ventricular ejection fraction, by estimation, is 60 to 65%. The left ventricle has normal function. The left ventricle has no regional wall motion abnormalities. The left ventricular internal cavity size was normal in size. There is  mild concentric left ventricular hypertrophy. Left ventricular diastolic parameters are consistent with Grade I diastolic dysfunction (impaired relaxation). Normal left ventricular filling pressure. Right Ventricle: The right ventricular size is normal. No increase in right ventricular wall thickness. Right ventricular systolic function is normal. There is mildly elevated pulmonary artery systolic pressure. The tricuspid regurgitant velocity is 2.66  m/s, and with an assumed right atrial pressure of 8 mmHg, the estimated right ventricular systolic pressure is 82.8 mmHg. Left Atrium: Left atrial size was severely dilated. Right Atrium: Right atrial size was mildly dilated. Pericardium: A moderately sized pericardial effusion is present. The pericardial effusion is circumferential. There  is no evidence of cardiac tamponade. Mitral Valve: The mitral valve is degenerative in appearance. There is mild thickening of the anterior and posterior mitral valve leaflet(s). Mild mitral annular calcification. Mild mitral valve regurgitation. No evidence of mitral valve stenosis. Tricuspid Valve: The tricuspid valve is grossly normal. Tricuspid valve regurgitation is mild . No evidence of tricuspid stenosis. Aortic Valve: There is severe aortic stenosis, and at least moderate aortic regurgitation. Gradients across AoV partially related to regurgitation (LVOT VTI 27 cm). V max 4.2 m/s, MG 41 mmHG, AVA 0.95 cm2, DI 0.25. The aortic valve is tricuspid. . There is severe thickening and severe calcifcation of the aortic valve. Aortic valve regurgitation is moderate. Aortic regurgitation PHT measures 323 msec. Severe aortic stenosis is present. There is severe thickening of the aortic valve. There is severe calcifcation of the aortic valve. Aortic valve mean gradient measures 41.0 mmHg. Aortic valve peak gradient measures 71.6 mmHg. Aortic valve area, by VTI measures 0.95 cm. Pulmonic Valve: The pulmonic valve was grossly normal. Pulmonic valve regurgitation is trivial. No evidence of pulmonic stenosis. Aorta: The aortic root and ascending aorta are structurally normal, with no evidence of dilitation. Venous: The inferior vena cava is normal in size with less than 50% respiratory variability, suggesting right atrial pressure of 8 mmHg. IAS/Shunts: The atrial septum is grossly normal.  LEFT VENTRICLE PLAX 2D LVIDd:  5.40 cm  Diastology LVIDs:         3.10 cm  LV e' lateral:   4.24 cm/s LV PW:         1.20 cm  LV E/e' lateral: 16.2 LV IVS:        1.00 cm  LV e' medial:    5.11 cm/s LVOT diam:     2.20 cm  LV E/e' medial:  13.4 LV SV:         106 LV SV Index:   53       2D Longitudinal Strain LVOT Area:     3.80 cm 2D Strain GLS (A2C):   -19.6 %                         2D Strain GLS (A3C):   -19.3 %                          2D Strain GLS (A4C):   -19.3 %                         2D Strain GLS Avg:     -19.4 %                          3D Volume EF:                         3D EF:        63 %                         LV EDV:       176 ml                         LV ESV:       65 ml                         LV SV:        111 ml RIGHT VENTRICLE             IVC RV Basal diam:  3.30 cm     IVC diam: 2.00 cm RV S prime:     12.90 cm/s TAPSE (M-mode): 3.5 cm LEFT ATRIUM              Index       RIGHT ATRIUM           Index LA diam:        5.50 cm  2.74 cm/m  RA Area:     13.60 cm LA Vol (A2C):   134.0 ml 66.73 ml/m RA Volume:   30.40 ml  15.14 ml/m LA Vol (A4C):   91.1 ml  45.37 ml/m LA Biplane Vol: 110.0 ml 54.78 ml/m  AORTIC VALVE AV Area (Vmax):    0.91 cm AV Area (Vmean):   1.03 cm AV Area (VTI):     0.95 cm AV Vmax:           423.00 cm/s AV Vmean:          273.400 cm/s AV VTI:            1.110 m AV Peak Grad:      71.6 mmHg AV Mean Grad:  41.0 mmHg LVOT Vmax:         100.75 cm/s LVOT Vmean:        74.150 cm/s LVOT VTI:          0.278 m LVOT/AV VTI ratio: 0.25 AI PHT:            323 msec  AORTA Ao Root diam: 3.40 cm Ao Asc diam:  3.20 cm MITRAL VALVE                TRICUSPID VALVE MV Area (PHT): 2.95 cm     TR Peak grad:   28.3 mmHg MV Decel Time: 257 msec     TR Vmax:        266.00 cm/s MV E velocity: 68.60 cm/s MV A velocity: 127.00 cm/s  SHUNTS MV E/A ratio:  0.54         Systemic VTI:  0.28 m                             Systemic Diam: 2.20 cm Eleonore Chiquito MD Electronically signed by Eleonore Chiquito MD Signature Date/Time: 12/09/2019/12:32:31 PM    Final    LONG TERM MONITOR (3-14 DAYS)  Result Date: 12/20/2019 1. SR/ST/SB 2. Occasional PACs/PVCs 3. Short runs of PSVT  VAS US CAROTID  Result Date: 12/30/2019 Carotid Arterial Duplex Study Indications:      Pre- TAVR. Risk Factors:     Hypertension, hyperlipidemia. Comparison Study: 09-04-2018 Prior carotid duplex showed RT ICA 1-39%, LT ICA                    1-39% Performing Technologist: HODGE, RACHEL, M  Examination Guidelines: A complete evaluation includes B-mode imaging, spectral Doppler, color Doppler, and power Doppler as needed of all accessible portions of each vessel. Bilateral testing is considered an integral part of a complete examination. Limited examinations for reoccurring indications may be performed as noted.  Right Carotid Findings: +----------+--------+--------+--------+--------------------------+--------+           PSV cm/sEDV cm/sStenosisPlaque Description        Comments +----------+--------+--------+--------+--------------------------+--------+ CCA Prox  71      11              heterogenous                       +----------+--------+--------+--------+--------------------------+--------+ CCA Distal97      14              heterogenous                       +----------+--------+--------+--------+--------------------------+--------+ ICA Prox  138     27      1-39%   heterogenous and irregular         +----------+--------+--------+--------+--------------------------+--------+ ICA Distal100     28                                                 +----------+--------+--------+--------+--------------------------+--------+ ECA       190             >50%                                       +----------+--------+--------+--------+--------------------------+--------+ +----------+--------+-------+----------------+-------------------+  PSV cm/sEDV cmsDescribe        Arm Pressure (mmHG) +----------+--------+-------+----------------+-------------------+ [id number]             Multiphasic, WNL                    +----------+--------+-------+----------------+-------------------+ +---------+--------+--+--------+--+---------+ VertebralPSV cm/s62EDV cm/s15Antegrade +---------+--------+--+--------+--+---------+  Left Carotid Findings:  +----------+-------+-------+--------+------------------------+-----------------+           PSV    EDV    StenosisPlaque Description      Comments                    cm/s   cm/s                                                     +----------+-------+-------+--------+------------------------+-----------------+ CCA Prox  93     15             heterogenous                              +----------+-------+-------+--------+------------------------+-----------------+ CCA Distal88     13             heterogenous            intimal                                                                   thickening        +----------+-------+-------+--------+------------------------+-----------------+ ICA Prox  98     20     1-39%   calcific and irregular                    +----------+-------+-------+--------+------------------------+-----------------+ ICA Distal67     22                                                       +----------+-------+-------+--------+------------------------+-----------------+ ECA       220           >50%    heterogenous and                                                          irregular                                 +----------+-------+-------+--------+------------------------+-----------------+ +----------+--------+--------+----------------+-------------------+           PSV cm/sEDV cm/sDescribe        Arm Pressure (mmHG) +----------+--------+--------+----------------+-------------------+ [id number]              Multiphasic, WNL                    +----------+--------+--------+----------------+-------------------+ +---------+--------+--+--------+--+---------+ VertebralPSV  cm/s44EDV cm/s13Antegrade +---------+--------+--+--------+--+---------+   Summary: Right Carotid: Velocities in the right ICA are consistent with a 1-39% stenosis.                Non-hemodynamically significant plaque <50% noted in the CCA. The                 ECA appears >50% stenosed. Left Carotid: Velocities in the left ICA are consistent with a 1-39% stenosis.               Non-hemodynamically significant plaque <50% noted in the CCA. The               ECA appears >50% stenosed. Vertebrals:  Bilateral vertebral arteries demonstrate antegrade flow. Subclavians: Normal flow hemodynamics were seen in bilateral subclavian              arteries. *See table(s) above for measurements and observations.  Electronically signed by Harold Barban MD on 12/30/2019 at 5:10:03 PM.    Final    VAS [place] UPPER EXT VEIN MAPPING (PRE-OP AVF)  Result Date: 12/30/2019 UPPER EXTREMITY VEIN MAPPING  Indications: Pre-access. Comparison Study: No prior studies. Performing Technologist: Darlin Coco, M  Examination Guidelines: A complete evaluation includes B-mode imaging, spectral Doppler, color Doppler, and power Doppler as needed of all accessible portions of each vessel. Bilateral testing is considered an integral part of a complete examination. Limited examinations for reoccurring indications may be performed as noted. +-----------------+-------------+----------+---------+ Right Cephalic   Diameter (cm)Depth (cm)Findings  +-----------------+-------------+----------+---------+ Shoulder             0.23        0.49             +-----------------+-------------+----------+---------+ Prox upper arm       0.26        0.58             +-----------------+-------------+----------+---------+ Mid upper arm        0.23        0.21             +-----------------+-------------+----------+---------+ Dist upper arm       0.23        0.24             +-----------------+-------------+----------+---------+ Antecubital fossa    0.29        0.34   branching +-----------------+-------------+----------+---------+ Prox forearm         0.26        0.27   branching +-----------------+-------------+----------+---------+ Mid forearm          0.16        0.32              +-----------------+-------------+----------+---------+ Dist forearm         0.18        0.28             +-----------------+-------------+----------+---------+ Wrist                0.16        0.29             +-----------------+-------------+----------+---------+ +-----------------+-------------+----------+--------------+ Right Basilic    Diameter (cm)Depth (cm)   Findings    +-----------------+-------------+----------+--------------+ Shoulder             0.74                              +-----------------+-------------+----------+--------------+ Prox upper arm  0.49                              +-----------------+-------------+----------+--------------+ Mid upper arm        0.33                 branching    +-----------------+-------------+----------+--------------+ Dist upper arm       0.28                              +-----------------+-------------+----------+--------------+ Antecubital fossa    0.19                 branching    +-----------------+-------------+----------+--------------+ Prox forearm                            not visualized +-----------------+-------------+----------+--------------+ Mid forearm                             not visualized +-----------------+-------------+----------+--------------+ Distal forearm                          not visualized +-----------------+-------------+----------+--------------+ Wrist                                   not visualized +-----------------+-------------+----------+--------------+ +-----------------+-------------+----------+--------+ Left Cephalic    Diameter (cm)Depth (cm)Findings +-----------------+-------------+----------+--------+ Shoulder             0.34        0.45            +-----------------+-------------+----------+--------+ Prox upper arm       0.35        0.36            +-----------------+-------------+----------+--------+ Mid upper arm        0.33         0.28            +-----------------+-------------+----------+--------+ Dist upper arm       0.33        0.36            +-----------------+-------------+----------+--------+ Antecubital fossa    0.38        0.32            +-----------------+-------------+----------+--------+ Prox forearm         0.23        0.72            +-----------------+-------------+----------+--------+ Mid forearm          0.22        0.30            +-----------------+-------------+----------+--------+ Dist forearm         0.20        0.36            +-----------------+-------------+----------+--------+ Wrist                0.16        0.26            +-----------------+-------------+----------+--------+ +-----------------+-------------+----------+--------------+ Left Basilic     Diameter (cm)Depth (cm)   Findings    +-----------------+-------------+----------+--------------+ Prox upper arm       0.26                              +-----------------+-------------+----------+--------------+  Mid upper arm        0.23                              +-----------------+-------------+----------+--------------+ Dist upper arm       0.30                              +-----------------+-------------+----------+--------------+ Antecubital fossa    0.21                 branching    +-----------------+-------------+----------+--------------+ Prox forearm                            not visualized +-----------------+-------------+----------+--------------+ Mid forearm                             not visualized +-----------------+-------------+----------+--------------+ Distal forearm                          not visualized +-----------------+-------------+----------+--------------+ Wrist                                   not visualized +-----------------+-------------+----------+--------------+ *See table(s) above for measurements and observations.  Diagnosing physician: Harold Barban MD Electronically signed by Harold Barban MD on 12/30/2019 at 5:09:24 PM.    Final    CT Angio Abd/Pel w/ and/or w/o  Result Date: 12/31/2019 CLINICAL DATA:  79 year old male with history of severe aortic stenosis. Preprocedural evaluation prior to potential transcatheter aortic valve replacement (TAVR). EXAM: CT ANGIOGRAPHY CHEST, ABDOMEN AND PELVIS TECHNIQUE: Non-contrast CT of the chest was initially obtained. Multidetector CT imaging through the chest, abdomen and pelvis was performed using the standard protocol during bolus administration of intravenous contrast. Multiplanar reconstructed images and MIPs were obtained and reviewed to evaluate the vascular anatomy. CONTRAST:  166mL OMNIPAQUE IOHEXOL 350 MG/ML SOLN COMPARISON:  CT the abdomen and pelvis 10/29/2017. Chest CTA 06/07/2009. FINDINGS: CTA CHEST FINDINGS Cardiovascular: Heart size is mildly enlarged. Small amount of pericardial fluid and/or thickening. No pericardial calcifications. There is aortic atherosclerosis, as well as atherosclerosis of the great vessels of the mediastinum and the coronary arteries, including calcified atherosclerotic plaque in the left main, left anterior descending, left circumflex and right coronary arteries. Severe thickening calcification of the aortic valve. Calcifications of the mitral annulus. Mediastinum/Lymph Nodes: No pathologically enlarged mediastinal or hilar lymph nodes. Esophagus is unremarkable in appearance. No axillary lymphadenopathy. Lungs/Pleura: Moderate bilateral pleural effusions. Left pleural effusion is completely simple lying dependently. Right pleural effusion is predominantly dependently located, although there is a small loculated component in the periphery of the anterolateral right hemithorax (axial image 69 of series 4). These are associated with areas of passive subsegmental atelectasis in the lower lobes of the lungs bilaterally. No acute consolidative airspace disease. No suspicious  appearing pulmonary nodules or masses are noted. Musculoskeletal/Soft Tissues: There are no aggressive appearing lytic or blastic lesions noted in the visualized portions of the skeleton. CTA ABDOMEN AND PELVIS FINDINGS Hepatobiliary: Liver has a slightly shrunken appearance and nodular contour, suggesting underlying cirrhosis. Two somewhat ill-defined areas of hypervascularity are noted in the central aspect of the liver, one adjacent to the gallbladder fossa (axial image 112 of series 4) measuring 1.6 x  0.9 cm, and the other in segment 4A of the liver (axial image 97 of series 4) measuring 1.4 x 1.2 cm. No intra or extrahepatic biliary ductal dilatation. Status post cholecystectomy. Pancreas: No pancreatic mass. No pancreatic ductal dilatation. No well-defined pancreatic or peripancreatic fluid collections to suggest pseudocyst. Spleen: Unremarkable. Adrenals/Urinary Tract: Bilateral kidneys and adrenal glands are normal in appearance. No hydroureteronephrosis. Urinary bladder is partially obscured by beam hardening artifact from the patient's left hip arthroplasty, but visualized portions are unremarkable. Stomach/Bowel: Fluid adjacent to the greater curvature of the proximal stomach, presumably some fluid tracking in the gastrosplenic ligament. Otherwise, stomach is unremarkable in appearance. No pathologic dilatation of small bowel or colon. Several small bowel diverticulae are noted in the duodenum and proximal jejunum, measuring up to 2.5 x 2.1 cm in the proximal jejunum (axial image 125 of series 4), without surrounding inflammatory changes to suggest an associated diverticulitis at this time. 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 Normal appendix. Vascular/Lymphatic: Aortic atherosclerosis with vascular findings and measurements pertinent to potential TAVR procedure, as detailed below. Fusiform aneurysmal dilatation  of the infrarenal abdominal aorta measuring up to 4.7 x 4.2 cm. No lymphadenopathy noted in the abdomen or pelvis. Reproductive: Prostate gland and seminal vesicles are unremarkable in appearance. Other: Small volume of ascites.  No pneumoperitoneum. Musculoskeletal: Status post PLIF from L3-L5 with interbody grafts at L3-L4 and L4-L5. Status post left hip arthroplasty. 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 There are no aggressive appearing lytic or blastic lesions noted in the visualized portions of the skeleton. VASCULAR MEASUREMENTS PERTINENT TO TAVR: AORTA: Minimal Aortic Diameter-20 x 17 mm Severity of Aortic Calcification-moderate to severe RIGHT PELVIS: Right Common Iliac Artery - Minimal Diameter-12.0 x 10.2 mm Tortuosity-mild Calcification-moderate Right External Iliac Artery - Minimal Diameter-7.0 x 7.4 mm Tortuosity-moderate Calcification-mild Right Common Femoral Artery - Minimal Diameter-7.8 x 6.6 mm Tortuosity-mild Calcification-moderate LEFT PELVIS: Left Common Iliac Artery - Minimal Diameter-10.8 x 11.3 mm Tortuosity-mild Calcification-mild Left External Iliac Artery - Minimal Diameter-7.8 x 6.6 mm Tortuosity-moderate Calcification-none Left Common Femoral Artery - Minimal Diameter-7.0 x 7.5 mm Tortuosity-mild Calcification-moderate Review of the MIP images confirms the above findings. IMPRESSION: 1. Vascular findings and measurements pertinent to potential TAVR procedure, as detailed above. 2. Severe thickening calcification of the aortic valve, compatible with reported clinical history of severe aortic stenosis. 3. Aortic atherosclerosis, in addition to left main and 3 vessel coronary artery disease. There is also fusiform aneurysmal dilatation of the infrarenal abdominal aorta which measures up to 4.7 x 4.2 cm in diameter. Recommend followup by abdomen and pelvis CTA in 6 months, and vascular surgery  referral/consultation if not already obtained. This recommendation follows ACR consensus guidelines: White Paper of the ACR Incidental Findings Committee II on Vascular Findings. J Am Coll Radiol 2013; 10:789-794. Aortic aneurysm NOS (ICD10-I71.9). 4. Mild cardiomegaly. 5. Moderate bilateral pleural effusions with partial loculation of a small portion of the right pleural effusion. 6. Morphologic changes in the liver suggestive of underlying cirrhosis with 2 ill-defined areas of hypervascularity. These may simply represent benign perfusion anomalies, however, given the cirrhotic changes, further evaluation with nonemergent abdominal MRI with and without IV gadolinium is strongly recommended in the near future to exclude underlying hepatocellular carcinoma. Correlation with AFP levels is also recommended. 7. Small volume of ascites. 8. Colonic and small bowel diverticulosis without evidence of acute diverticulitis  at this time. 9. Additional incidental findings, as above. Electronically Signed   By: Vinnie Langton M.D.   On: 12/31/2019 08:23     The results of significant diagnostics from this hospitalization (including imaging, microbiology, ancillary and laboratory) are listed below for reference.     Microbiology: Recent Results (from the past 240 hour(s))  SARS Coronavirus 2 by RT PCR (hospital order, performed in [facility] hospital lab) Nasopharyngeal Nasopharyngeal Swab     Status: None   Collection Time: 12/28/19  7:09 PM   Specimen: Nasopharyngeal Swab  Result Value Ref Range Status   SARS Coronavirus 2 NEGATIVE NEGATIVE Final    Comment: (NOTE) SARS-CoV-2 target nucleic acids are NOT DETECTED.  The SARS-CoV-2 RNA is generally detectable in upper and lower respiratory specimens during the acute phase of infection. The lowest concentration of SARS-CoV-2 viral copies this assay can detect is 250 copies / mL. A negative result does not preclude SARS-CoV-2 infection and should not be used  as the sole basis for treatment or other patient management decisions.  A negative result may occur with improper specimen collection / handling, submission of specimen other than nasopharyngeal swab, presence of viral mutation(s) within the areas targeted by this assay, and inadequate number of viral copies (<250 copies / mL). A negative result must be combined with clinical observations, patient history, and epidemiological information.  Fact Sheet for Patients:   StrictlyIdeas.no  Fact Sheet for Healthcare Providers: BankingDealers.co.za  This test is not yet approved or  cleared by the [place] FDA and has been authorized for detection and/or diagnosis of SARS-CoV-2 by FDA under an Emergency Use Authorization (EUA).  This EUA will remain in effect (meaning this test can be used) for the duration of the COVID-19 declaration under Section 564(b)(1) of the Act, 21 U.S.C. section 360bbb-3(b)(1), unless the authorization is terminated or revoked sooner.  Performed at [facility], [place] [address] [place], [place] [address]   Surgical pcr screen     Status: None   Collection Time: 12/30/19 10:41 PM   Specimen: Nasal Mucosa; Nasal Swab  Result Value Ref Range Status   MRSA, PCR NEGATIVE NEGATIVE Final   Staphylococcus aureus NEGATIVE NEGATIVE Final    Comment: (NOTE) The Xpert SA Assay (FDA approved for NASAL specimens in patients 21 years of age and older), is one component of a comprehensive surveillance program. It is not intended to diagnose infection nor to guide or monitor treatment. Performed at [facility], [place] [address], [place], [place] [address]      Labs: BNP (last 3 results) Recent Labs    12/28/19 1419  BNP 8,676.7*   Basic Metabolic Panel: Recent Labs  Lab 12/28/19 1410 12/30/19 0818 12/31/19 0028 01/01/20 0515  NA 138 137 131* 136  K 3.7 3.8 3.8 4.2  CL 106 104 102 108  CO2 22 23 22   19*  GLUCOSE 150* 96 132* 98  BUN 58* 62* 60* 68*  CREATININE 3.15* 2.99* 2.99* 3.27*  CALCIUM 8.2* 8.4* 7.7* 8.2*  PHOS  --  3.9 3.5 4.1   Liver Function Tests: Recent Labs  Lab 12/28/19 1457 12/30/19 0818 12/31/19 0028 01/01/20 0515  AST 45*  --   --   --   ALT 18  --   --   --   ALKPHOS 41  --   --   --   BILITOT 0.8  --   --   --   PROT 5.1*  --   --   --  ALBUMIN 2.9* 2.5* 2.1* 2.1*   Recent Labs  Lab 12/28/19 1457  LIPASE 41   No results for input(s): AMMONIA in the last 168 hours. CBC: Recent Labs  Lab 12/28/19 1410 12/28/19 1410 12/30/19 0818 12/31/19 0028 12/31/19 0524 12/31/19 1317 01/01/20 0515  WBC 8.6  --  6.8 5.7  --  5.0 4.8  HGB 8.2*   < > 8.0* 7.1* 6.8* 8.6* 7.8*  HCT 26.7*   < > 25.0* 21.9* 21.3* 27.1* 24.0*  MCV 97.8  --  96.9 94.8  --  96.4 95.2  PLT 147*  --  144* 139*  --  156 143*   < > = values in this interval not displayed.   Cardiac Enzymes: No results for input(s): CKTOTAL, CKMB, CKMBINDEX, TROPONINI in the last 168 hours. BNP: Invalid input(s): POCBNP CBG: No results for input(s): GLUCAP in the last 168 hours. D-Dimer No results for input(s): DDIMER in the last 72 hours. Hgb A1c No results for input(s): HGBA1C in the last 72 hours. Lipid Profile No results for input(s): CHOL, HDL, LDLCALC, TRIG, CHOLHDL, LDLDIRECT in the last 72 hours. Thyroid function studies No results for input(s): TSH, T4TOTAL, T3FREE, THYROIDAB in the last 72 hours.  Invalid input(s): FREET3 Anemia work up Recent Labs    12/30/19 0818 12/31/19 1317  VITAMINB12  --  263  FOLATE  --  9.9  FERRITIN 289 386*  TIBC 291 256  IRON 25* 324*  RETICCTPCT  --  1.6   Urinalysis    Component Value Date/Time   COLORURINE YELLOW 12/20/2016 0924   APPEARANCEUR CLEAR 12/20/2016 0924   LABSPEC 1.013 12/20/2016 0924   PHURINE 7.0 12/20/2016 0924   GLUCOSEU NEGATIVE 12/20/2016 0924   HGBUR NEGATIVE 12/20/2016 [place] NEGATIVE 12/20/2016 0924    KETONESUR NEGATIVE 12/20/2016 0924   PROTEINUR NEGATIVE 12/20/2016 0924   UROBILINOGEN 0.2 11/15/2014 2052   NITRITE NEGATIVE 12/20/2016 0924   LEUKOCYTESUR NEGATIVE 12/20/2016 0924   Sepsis Labs Invalid input(s): PROCALCITONIN,  WBC,  LACTICIDVEN Microbiology Recent Results (from the past 240 hour(s))  SARS Coronavirus 2 by RT PCR (hospital order, performed in [facility] hospital lab) Nasopharyngeal Nasopharyngeal Swab     Status: None   Collection Time: 12/28/19  7:09 PM   Specimen: Nasopharyngeal Swab  Result Value Ref Range Status   SARS Coronavirus 2 NEGATIVE NEGATIVE Final    Comment: (NOTE) SARS-CoV-2 target nucleic acids are NOT DETECTED.  The SARS-CoV-2 RNA is generally detectable in upper and lower respiratory specimens during the acute phase of infection. The lowest concentration of SARS-CoV-2 viral copies this assay can detect is 250 copies / mL. A negative result does not preclude SARS-CoV-2 infection and should not be used as the sole basis for treatment or other patient management decisions.  A negative result may occur with improper specimen collection / handling, submission of specimen other than nasopharyngeal swab, presence of viral mutation(s) within the areas targeted by this assay, and inadequate number of viral copies (<250 copies / mL). A negative result must be combined with clinical observations, patient history, and epidemiological information.  Fact Sheet for Patients:   StrictlyIdeas.no  Fact Sheet for Healthcare Providers: BankingDealers.co.za  This test is not yet approved or  cleared by the [place] FDA and has been authorized for detection and/or diagnosis of SARS-CoV-2 by FDA under an Emergency Use Authorization (EUA).  This EUA will remain in effect (meaning this test can be used) for the duration of the COVID-19 declaration  under Section 564(b)(1) of the Act, 21 U.S.C. section  360bbb-3(b)(1), unless the authorization is terminated or revoked sooner.  Performed at [facility], [place] [address] [place], [place] [address]   Surgical pcr screen     Status: None   Collection Time: 12/30/19 10:41 PM   Specimen: Nasal Mucosa; Nasal Swab  Result Value Ref Range Status   MRSA, PCR NEGATIVE NEGATIVE Final   Staphylococcus aureus NEGATIVE NEGATIVE Final    Comment: (NOTE) The Xpert SA Assay (FDA approved for NASAL specimens in patients 64 years of age and older), is one component of a comprehensive surveillance program. It is not intended to diagnose infection nor to guide or monitor treatment. Performed at [facility], [place] [address], [place], [place] [address]      Time coordinating discharge in minutes: 65  SIGNED:   Debbe Odea, MD  [facility] 01/01/2020, 9:56 AM

## 2020-01-04 ENCOUNTER — Other Ambulatory Visit: Payer: Self-pay | Admitting: Physician Assistant

## 2020-01-04 ENCOUNTER — Telehealth: Payer: Self-pay | Admitting: Physician Assistant

## 2020-01-04 DIAGNOSIS — N186 End stage renal disease: Secondary | ICD-10-CM

## 2020-01-04 NOTE — Telephone Encounter (Signed)
  [facility]   MULTIDISCIPLINARY HEART VALVE TEAM  PYP scan rescheduled to 8/6, which was our first available. Pt is seeing PCP this Friday. I will reach out to Shanon Brow and see if he can get a BMET at that visit.   Angelena Form PA-C  [facility]

## 2020-01-07 ENCOUNTER — Telehealth: Payer: Self-pay | Admitting: Cardiovascular Disease

## 2020-01-07 NOTE — Telephone Encounter (Signed)
Spoke to wife (ok per DPR)-patient is taking furosemide (Lasix) 40 mg daily.   Advised per MD take 20 mg (1/2 tablet now) and starting tomorrow take 60 mg (1.5 tablets) daily.    Advised to verify lab work is completed at PCP tomorrow.   Wife verbalized understanding.     Will copy PCP on message as well.

## 2020-01-07 NOTE — Telephone Encounter (Signed)
Returned call to [employer], NP from [employer] (program through [facility]).  She went to patients home today for a hospital follow up.    Patient is experiencing increased SOB with exertion, states he has this at baseline but reports this has worsened since discharge.   States patient is sleeping in a recliner (which is new for him) because he cannot lay flat or propped up on pillows. He had 1+ pitting edema in his ankles and feet.  Patient reports scrotal edema this morning but had resolved when NP was at the visit.   Also reported some mild blood tinged sputum this AM.   She reports his lungs were clear to auscultation, O2 was consistently 94%, did not drop with ambulation.   BP 140/58 Hr 58, RR 18, temp normal.   Weight today was 173 lbs (165 lbs at discharge).   He is currently taking furosemide (Lasix) 40 mg daily (med list states alternate with 20, she is unsure how he is taking).    He also had AV fistula placed 7/8, possibly starting dialysis.     He has an appointment with his PCP in the AM but she wanted to relay information to Dr. [name] as well.   NP states patient was in no distress at visit or when she left.     Per chart review: patient is being evaluated for TAVR, saw Dr. Burt Knack on 7/1, CKD IV not currently on dialysis, anemia, CAD.  OV with Dr. Burt Knack 7/1 to discuss TAVR  Scheduled to see Dr. Roxy Manns 7/26 for TAVR workup.  NP called to make PCP aware as well.    She states patient was informed to start weighing himself daily and is aware that if symptoms worsen to proceed to ER for evaluation.    Routed to Dr. Burt Knack, Dr. [name] (OOO this week),and Angelena Form PA for review and recommendations.     Also routed to DOD for urgent recommendations.

## 2020-01-07 NOTE — Telephone Encounter (Signed)
Terry Garner from [facility] wanted to let Dr. [name] know of her findings at her visit today with the patient. Please call back.

## 2020-01-07 NOTE — Telephone Encounter (Signed)
Please confirm that he is on furosemide 40 mg daily. If so, add another 20 mg for total of 60 mg daily (including today). BMET in AM please.

## 2020-01-07 NOTE — Telephone Encounter (Signed)
Informed Beth the patient does not need SBE at this time and is fine to have his teeth cleaned. She understands he will require SBE should he undergo TAVR.

## 2020-01-07 NOTE — Telephone Encounter (Signed)
Terry Garner is calling from [employer] DDS, wanting to see if it is ok with Dr. Burt Knack that they bring the patient in for a cleaning next week, 7/22 before the patient starts treatment with the patient. Beth advised she had just faxed a letter over stating that everything was ok with the patient today and also asked about doing the cleaning on 7/22. Please advise.

## 2020-01-08 ENCOUNTER — Other Ambulatory Visit: Payer: Self-pay

## 2020-01-08 ENCOUNTER — Ambulatory Visit (INDEPENDENT_AMBULATORY_CARE_PROVIDER_SITE_OTHER): Payer: Medicare Other | Admitting: Medical

## 2020-01-08 ENCOUNTER — Encounter: Payer: Self-pay | Admitting: Medical

## 2020-01-08 VITALS — BP 148/50 | HR 55 | Ht 74.0 in | Wt 173.4 lb

## 2020-01-08 DIAGNOSIS — I779 Disorder of arteries and arterioles, unspecified: Secondary | ICD-10-CM | POA: Diagnosis not present

## 2020-01-08 DIAGNOSIS — I350 Nonrheumatic aortic (valve) stenosis: Secondary | ICD-10-CM | POA: Diagnosis not present

## 2020-01-08 DIAGNOSIS — I209 Angina pectoris, unspecified: Secondary | ICD-10-CM

## 2020-01-08 DIAGNOSIS — N184 Chronic kidney disease, stage 4 (severe): Secondary | ICD-10-CM

## 2020-01-08 DIAGNOSIS — R195 Other fecal abnormalities: Secondary | ICD-10-CM

## 2020-01-08 DIAGNOSIS — R932 Abnormal findings on diagnostic imaging of liver and biliary tract: Secondary | ICD-10-CM | POA: Diagnosis not present

## 2020-01-08 DIAGNOSIS — I714 Abdominal aortic aneurysm, without rupture, unspecified: Secondary | ICD-10-CM

## 2020-01-08 DIAGNOSIS — Z9289 Personal history of other medical treatment: Secondary | ICD-10-CM

## 2020-01-08 DIAGNOSIS — I701 Atherosclerosis of renal artery: Secondary | ICD-10-CM

## 2020-01-08 DIAGNOSIS — D631 Anemia in chronic kidney disease: Secondary | ICD-10-CM | POA: Diagnosis not present

## 2020-01-08 DIAGNOSIS — E559 Vitamin D deficiency, unspecified: Secondary | ICD-10-CM

## 2020-01-08 DIAGNOSIS — R0609 Other forms of dyspnea: Secondary | ICD-10-CM

## 2020-01-08 DIAGNOSIS — R0600 Dyspnea, unspecified: Secondary | ICD-10-CM

## 2020-01-08 DIAGNOSIS — I4510 Unspecified right bundle-branch block: Secondary | ICD-10-CM

## 2020-01-08 DIAGNOSIS — I25810 Atherosclerosis of coronary artery bypass graft(s) without angina pectoris: Secondary | ICD-10-CM

## 2020-01-08 DIAGNOSIS — H9190 Unspecified hearing loss, unspecified ear: Secondary | ICD-10-CM

## 2020-01-08 NOTE — Progress Notes (Signed)
Subjective:  Terry Garner is a 79 y.o. male who presents for Chief Complaint  Patient presents with   Follow-up    needs BMP     Here for hospital follow-up.  Medical team: Dr. Quay Burow, cardiology Dr. Zigmund Daniel, eye doctor and Dr. Katy Fitch Dentist Dr. Servando Salina, nephrology Dr. Darylene Price with cardiothoracic surgery, upcoming new patient appt Dr. Sherren Mocha, cardiothoracic surgery  Admitted July 5 to 01/01/2020, [place].  Discharge Diagnoses:  Principal Problem:   Angina pectoris [facility]) Active Problems: Severe aortic stenosis   Coronary artery disease of native artery of native heart with stable angina pectoris (HCC) Anemia of chronic disease A-fib with RVR- new diagnosis   Hyperlipidemia   Uncontrolled hypertension   Abdominal aortic aneurysm ([place])   CKD (chronic kidney disease) stage 4, GFR 15-29 ml/min [facility])    Terry Garner is an 79 year old white male with medical history significant for CAD with remote PCI of the LAD and diagonal branches, hypertension, hyperlipidemia, anemia, stage IV CKD, moderate carotid atherosclerosis, renal artery stenosis, RBBB, abdominal aortic aneurysm, and recently discovered severe aortic stenosis that presented with chest pain.    Just had dialysis access shunt surgery left arm about a week ago.  Still has some bruising and swelling.  Regarding liver finding on scan with this hospital visit, he notes he has never drank alcohol.  He and wife note a fair amount of fatty foods in diet over the years.    Has had some diarrhea since being in the hospital.   Having about 5 loose stools in the night, 6-7 times during the day.  No blood in the stool, no unusual colors or foul odor  He gets DOE just walking back and forth to bathroom. Has his new appt with Dr. Roxy Manns, [place] 01/18/20.     Home health nurse came out yesterday and send [place] a message that she noticed 1+ edema in his legs and wanted to draw BMET per cardiology phone call  yesterday.  Cardiology did send word to them yesterday through the nurse to increase Lasix temporarily.  He is compliant with new medication changes.   No other aggravating or relieving factors.    No other c/o.  Past Medical History:  Diagnosis Date   AAA (abdominal aortic aneurysm) (HCC)    3.6 cm (01/09/12 ultrasound)   Allergy    Arthritis    Bile duct leak 09/2017   post op   Carotid artery disease (HCC)    Cataract    Chronic kidney disease    CKD, right renal artery stenosis   Coronary artery disease    mild left internal carotid artery stenosis   Glaucoma    Gout    Heart murmur    Hyperlipidemia    Hypertension    Tubular adenoma of colon 2003   Dr. Watt Climes   Vitamin D deficiency    Current Outpatient Medications on File Prior to Visit  Medication Sig Dispense Refill   allopurinol (ZYLOPRIM) 300 MG tablet TAKE 1 TABLET BY MOUTH ONCE DAILY (Patient taking differently: Take 300 mg by mouth daily. ) 90 tablet 1   amLODipine (NORVASC) 10 MG tablet Take 1 tablet (10 mg total) by mouth daily. 90 tablet 3   apixaban (ELIQUIS) 2.5 MG TABS tablet Take 1 tablet (2.5 mg total) by mouth 2 (two) times daily. 60 tablet 0   brimonidine (ALPHAGAN) 0.15 % ophthalmic solution Place 1 drop into both eyes 2 (two) times daily.  Cholecalciferol (VITAMIN D3) 5000 UNITS TABS Take 5,000 Units by mouth daily.     clopidogrel (PLAVIX) 75 MG tablet TAKE 1 TABLET BY MOUTH ONCE DAILY FOR HEART STENTS (Patient taking differently: Take 75 mg by mouth daily. ) 90 tablet 1   dorzolamide (TRUSOPT) 2 % ophthalmic solution Place 1 drop into the right eye 2 (two) times daily.      fenofibrate micronized (LOFIBRA) 134 MG capsule TAKE 1 CAPSULE BY MOUTH ONCE DAILY (Patient taking differently: Take 134 mg by mouth daily before breakfast. ) 90 capsule 1   ferrous sulfate 325 (65 FE) MG tablet Take 325 mg by mouth daily with breakfast.     furosemide (LASIX) 40 MG tablet Take 60 mg  by mouth daily.  4   hydrALAZINE (APRESOLINE) 25 MG tablet Take 1 tablet (25 mg total) by mouth 3 (three) times daily. 90 tablet 0   isosorbide mononitrate (IMDUR) 30 MG 24 hr tablet Take 1 tablet (30 mg total) by mouth 2 (two) times daily. 60 tablet 0   ketorolac (ACULAR) 0.5 % ophthalmic solution Place 1 drop into the right eye daily as needed (itching eye).      metoprolol tartrate (LOPRESSOR) 25 MG tablet Take 1 tablet (25 mg total) by mouth 2 (two) times daily. 60 tablet 0   nepafenac (ILEVRO) 0.3 % ophthalmic suspension Place 1 drop into the right eye at bedtime.      pravastatin (PRAVACHOL) 40 MG tablet TAKE 1 TABLET BY MOUTH AT BEDTIME FOR CHOLESTEROL (Patient taking differently: Take 40 mg by mouth daily. ) 90 tablet 1   triamcinolone cream (KENALOG) 0.1 % Apply 1 application topically daily as needed (rash on Scalp).      trimethoprim-polymyxin b (POLYTRIM) ophthalmic solution Place 1 drop into the right eye See admin instructions. Monthly after injection in right eye four times a day     No current facility-administered medications on file prior to visit.    The following portions of the patient's history were reviewed and updated as appropriate: allergies, current medications, past family history, past medical history, past social history, past surgical history and problem list.  ROS Otherwise as in subjective above    Objective: BP (!) 148/50    Pulse (!) 55    Ht '6\' 2"'$  (1.88 m)    Wt 173 lb 6.4 oz (78.7 kg)    SpO2 95%    BMI 22.26 kg/m   Wt Readings from Last 3 Encounters:  01/08/20 173 lb 6.4 oz (78.7 kg)  01/01/20 165 lb (74.8 kg)  12/24/19 164 lb 3.2 oz (74.5 kg)   BP Readings from Last 3 Encounters:  01/08/20 (!) 148/50  01/01/20 130/74  12/24/19 (!) 148/64    General appearance: alert, no distress, well developed, well nourished, white male Hard of hearing Bilateral carotid bruit auscultated 3 out of 6 holosystolic aortic stenosis murmur heard in  upper sternal borders, rhythm seems to be regular today Upper and lower extremity pulses within normal limits today, cap refill normal Mild 1+ nonpitting lower extremity edema bilaterally Abdomen positive bowel sounds, not overactive, nontender, no mass no organomegaly Lungs clear Left antecubital region and distal upper arm on the left with fresh surgical wound from about a week ago, seems to be healing appropriately, there is some pinkish-purple bruising on the medial left upper arm and at the wound site but no warmth no fluctuance nontender, there is mild swelling in general around the new surgical wound area    Assessment:  Encounter Diagnoses  Name Primary?   Severe aortic stenosis Yes   Angina pectoris (HCC)    CKD (chronic kidney disease) stage 4, GFR 15-29 ml/min (HCC)    Bilateral carotid artery disease, unspecified type ([place])    Coronary artery disease involving coronary bypass graft of native heart without angina pectoris    Right bundle branch block    Abdominal aortic aneurysm (AAA) without rupture (HCC)    Renal artery stenosis (HCC)    Hearing loss, unspecified hearing loss type, unspecified laterality    Vitamin D deficiency    Loose stools    DOE (dyspnea on exertion)    Anemia in stage 4 chronic kidney disease (HCC)    Abnormal liver scan    History of recent hospitalization      Plan: I reviewed his recent hospitalization records, discharge summary, medicines reconciled, reviewed recent scans and labs.  The only med change since hospital was cardiology, Dr. Sallyanne Kuster responded to nurse call yesterday about his leg edema and symptoms, and increased him to 70m daily.   He has started this today.  Angina pectoris-improved after nitro infusion, troponin's are slightly elevated.  Aspirin was discontinued by cardiology but he was continued on Plavix and metoprolol.  He was transitioned to  Imdur 30 mg twice daily  Severe aortic stenosis found with this  hospitalization-he has follow-up with cardiothoracic surgery, undergoing assessment for TAVR.  His next appointment is July 26 with Dr. ORoxy Mannscardiothoracic surgery  Atrial fibrillation with RVR noted on July 7, and he was started on metoprolol and low-dose Eliquis 2.5 mg twice daily  Anemia.  His baseline seems to be 7.4-8.0.  He had a drop with this hospitalization.  Hospitalist requested he have follow-up today to repeat blood count.  He did receive 1 unit of packed red blood cells, hemoglobin improved 8.6.  His labs showed low iron, low iron saturation, but normal ferritin and B12.  He was started on oral iron replacement.  He did receive IV iron and Aranesp during hospitalization.  Questionable cirrhosis-on CT scanning during this hospitalization there was some concern for cirrhosis of the liver.  This was deferred to outpatient.  We will check some additional liver tests since he has no history of alcohol use.  Need to rule out hepatitis to further evaluate.  Consider GI consult, consider other imaging  Loose stool -labs today to help rule out C. difficile given the recent hospitalization.  Send him home with collection kit for stool to return  Carotid stenosis -continue current medications, follow-up with cardiology  DOE -lungs clear today, vital signs reviewed, likely due to the aortic stenosis.  He has cardiology follow-up plan.   Electrolyte labs today amongst other evaluation today   HDredenwas seen today for follow-up.  Diagnoses and all orders for this visit:  Severe aortic stenosis  Angina pectoris (HCC)  CKD (chronic kidney disease) stage 4, GFR 15-29 ml/min (HCC) -     Basic metabolic panel  Bilateral carotid artery disease, unspecified type (HCC)  Coronary artery disease involving coronary bypass graft of native heart without angina pectoris  Right bundle branch block  Abdominal aortic aneurysm (AAA) without rupture (HCC)  Renal artery stenosis (HCC) -     Basic  metabolic panel  Hearing loss, unspecified hearing loss type, unspecified laterality  Vitamin D deficiency  Loose stools -     Cancel: GI Profile, Stool, PCR -     GI Profile, Stool, PCR  DOE (dyspnea on exertion)  Anemia in stage 4 chronic kidney disease (HCC) -     Cancel: CBC with Differential/Platelet -     CBC  Abnormal liver scan -     Hepatitis C antibody -     Hepatitis B surface antigen -     AFP tumor marker  History of recent hospitalization    Follow up: pending labs

## 2020-01-08 NOTE — Telephone Encounter (Signed)
Thanks. Will do

## 2020-01-09 LAB — CBC
Hematocrit: 30.4 % — ABNORMAL LOW (ref 37.5–51.0)
Hemoglobin: 9.7 g/dL — ABNORMAL LOW (ref 13.0–17.7)
MCH: 30.4 pg (ref 26.6–33.0)
MCHC: 31.9 g/dL (ref 31.5–35.7)
MCV: 95 fL (ref 79–97)
Platelets: 230 10*3/uL (ref 150–450)
RBC: 3.19 x10E6/uL — ABNORMAL LOW (ref 4.14–5.80)
RDW: 15.2 % (ref 11.6–15.4)
WBC: 5.2 10*3/uL (ref 3.4–10.8)

## 2020-01-09 LAB — BASIC METABOLIC PANEL
BUN/Creatinine Ratio: 21 (ref 10–24)
BUN: 71 mg/dL — ABNORMAL HIGH (ref 8–27)
CO2: 22 mmol/L (ref 20–29)
Calcium: 8.3 mg/dL — ABNORMAL LOW (ref 8.6–10.2)
Chloride: 106 mmol/L (ref 96–106)
Creatinine, Ser: 3.43 mg/dL — ABNORMAL HIGH (ref 0.76–1.27)
GFR calc Af Amer: 18 mL/min/{1.73_m2} — ABNORMAL LOW (ref >59)
GFR calc non Af Amer: 16 mL/min/{1.73_m2} — ABNORMAL LOW (ref >59)
Glucose: 98 mg/dL (ref 65–99)
Potassium: 4.5 mmol/L (ref 3.5–5.2)
Sodium: 143 mmol/L (ref 134–144)

## 2020-01-09 LAB — HEPATITIS C ANTIBODY: Hep C Virus Ab: <0.1 s/co ratio (ref 0.0–0.9)

## 2020-01-09 LAB — HEPATITIS B SURFACE ANTIGEN: Hepatitis B Surface Ag: NEGATIVE

## 2020-01-09 LAB — AFP TUMOR MARKER: AFP, Serum, Tumor Marker: 1.2 ng/mL (ref 0.0–8.3)

## 2020-01-10 LAB — GI PROFILE, STOOL, PCR
Adenovirus F 40/41: NOT DETECTED
Astrovirus: NOT DETECTED
C difficile toxin A/B: DETECTED — AB
Campylobacter: NOT DETECTED
Cryptosporidium: NOT DETECTED
Cyclospora cayetanensis: NOT DETECTED
E coli O157: NOT DETECTED
Entamoeba histolytica: NOT DETECTED
Enteroaggregative E coli: NOT DETECTED
Enteropathogenic E coli: NOT DETECTED
Enterotoxigenic E coli: NOT DETECTED
Giardia lamblia: NOT DETECTED
Norovirus GI/GII: NOT DETECTED
Plesiomonas shigelloides: NOT DETECTED
Rotavirus A: NOT DETECTED
Salmonella: NOT DETECTED
Sapovirus: DETECTED — AB
Shiga-toxin-producing E coli: NOT DETECTED
Shigella/Enteroinvasive E coli: NOT DETECTED
Vibrio cholerae: NOT DETECTED
Vibrio: NOT DETECTED
Yersinia enterocolitica: NOT DETECTED

## 2020-01-11 ENCOUNTER — Other Ambulatory Visit: Payer: Self-pay | Admitting: Medical

## 2020-01-11 ENCOUNTER — Telehealth: Payer: Self-pay | Admitting: Internal Medicine

## 2020-01-11 MED ORDER — METRONIDAZOLE 500 MG PO TABS: 500.0000 mg | ORAL_TABLET | Freq: Three times a day (TID) | ORAL | 0 refills | Status: AC

## 2020-01-11 MED ORDER — CLOPIDOGREL BISULFATE 75 MG PO TABS: 75.0000 mg | ORAL_TABLET | Freq: Every day | ORAL | 1 refills | Status: DC

## 2020-01-11 NOTE — Telephone Encounter (Signed)
Spoke with pt wife, aware to increase furosemide to 80 mg once daily. Left message with [facility] to see if sooner appointment available.

## 2020-01-11 NOTE — Telephone Encounter (Signed)
Spoke with pt wife, the patient continues to have swelling in feet and legs. His weight is unchanged. He has been taking 60 mg of furosemide since Thursday last week. He has SOB when walking back from the bathroom and can have SOB when sitting still. He is sleeping in the recliner due to orthopnea. Will reach out to dr croitoru who discharged the patient from the hospital for advise

## 2020-01-11 NOTE — Telephone Encounter (Signed)
Patient's wife calling back. She the patient's ankles are still swollen and has some questions.

## 2020-01-11 NOTE — Telephone Encounter (Signed)
Refill request for clopidogrel 75mg  to [facility] [address]

## 2020-01-11 NOTE — Telephone Encounter (Signed)
I would increase the furosemide further to 80 mg daily. Needs a follow up appt w structural heart team or Dr. [name] as soon as possible, no later than this week.

## 2020-01-12 ENCOUNTER — Ambulatory Visit (HOSPITAL_COMMUNITY): Payer: Medicare Other | Attending: Cardiology

## 2020-01-12 ENCOUNTER — Telehealth: Payer: Self-pay

## 2020-01-12 ENCOUNTER — Telehealth: Payer: Self-pay | Admitting: Medical

## 2020-01-12 ENCOUNTER — Other Ambulatory Visit: Payer: Self-pay

## 2020-01-12 ENCOUNTER — Ambulatory Visit: Payer: Medicare Other | Admitting: Cardiovascular Disease

## 2020-01-12 DIAGNOSIS — C801 Malignant (primary) neoplasm, unspecified: Secondary | ICD-10-CM | POA: Diagnosis not present

## 2020-01-12 DIAGNOSIS — I3131 Malignant pericardial effusion in diseases classified elsewhere: Secondary | ICD-10-CM

## 2020-01-12 DIAGNOSIS — I350 Nonrheumatic aortic (valve) stenosis: Secondary | ICD-10-CM | POA: Diagnosis not present

## 2020-01-12 DIAGNOSIS — I313 Pericardial effusion (noninflammatory): Secondary | ICD-10-CM

## 2020-01-12 LAB — ECHOCARDIOGRAM COMPLETE
AR max vel: 0.78 cm2
AV Area VTI: 0.8 cm2
AV Area mean vel: 0.79 cm2
AV Mean grad: 32.6 mmHg
AV Peak grad: 57.2 mmHg
Ao pk vel: 3.78 m/s
Area-P 1/2: 2.56 cm2
S' Lateral: 3.2 cm

## 2020-01-12 MED ORDER — FUROSEMIDE 80 MG PO TABS: 80.0000 mg | ORAL_TABLET | Freq: Two times a day (BID) | ORAL | 3 refills | Status: AC

## 2020-01-12 NOTE — Telephone Encounter (Signed)
Yes I am glad you deactivated my chart.  If they are not using it then we should not be sending results there.  Lets just make sure that if I do not specifically say results sent through my chart, or if I do specifically say call patient about results I do want a personal call.  Particularly with older folks I believe the phone call was necessary.    With some of the younger people it may not be such a big issue.

## 2020-01-12 NOTE — Telephone Encounter (Signed)
   Echocardiogram images reviewed by Dr Audie Box, Dr Burt Knack and Dr Roxy Manns. Pericardial effusion appears to be mild-moderate on exam today.  Dr Burt Knack would like the pt to increase Furosemide to 80mg  twice a day.  BMP next week (this is scheduled for 6/30 because the pt was just started on antibiotic for cdiff and needs to quarantine).  Follow-up office visit with Dr Burt Knack on 8/4 to assess symptoms and continue discussion for TAVR plan.  The pt's apt with Dr Roxy Manns has been rescheduled to 8/16.  All instructions reviewed with the pt's wife by phone.  She is aware that if the pt develops further worsening in symptoms then they should proceed to the ER for evaluation.

## 2020-01-12 NOTE — Telephone Encounter (Signed)
Wife called for test results, states they do not use Mychart.  I informed of test results and Shane's instructions.  She would like the report also sent to Dr. Burt Knack Cardiologist.  I advised Audelia Acton recommends postponing appts.  She states pt had appointments today echocardiogram and has another cardiologist appointment on Monday.  I advised to call their office and advise of C diff.  She will have pt start the medication tonight and call back if no improvement or worsening.  I deactivated Mychart.

## 2020-01-13 ENCOUNTER — Other Ambulatory Visit: Payer: Self-pay

## 2020-01-13 DIAGNOSIS — N184 Chronic kidney disease, stage 4 (severe): Secondary | ICD-10-CM

## 2020-01-13 DIAGNOSIS — I350 Nonrheumatic aortic (valve) stenosis: Secondary | ICD-10-CM

## 2020-01-14 ENCOUNTER — Encounter (INDEPENDENT_AMBULATORY_CARE_PROVIDER_SITE_OTHER): Payer: Medicare Other | Admitting: Ophthalmology

## 2020-01-18 ENCOUNTER — Encounter: Payer: Medicare Other | Admitting: Thoracic Surgery (Cardiothoracic Vascular Surgery)

## 2020-01-20 ENCOUNTER — Telehealth: Payer: Self-pay | Admitting: Medical

## 2020-01-20 NOTE — Telephone Encounter (Signed)
Pt's wife called and states that pt is almost out of medication given for CDIF. She states that diarrhea is better but pt is still having 7 or 8 BM a day. She states she not sure if he still has it or not and how does she tell. She ask that someone call her back and let her know. She states that last time she wasn't told what was going on, med was just sent in and she had to call us to find out why. She can be reached at [phone] and uses [facility] [address].

## 2020-01-20 NOTE — Telephone Encounter (Signed)
I hope he was called about his labs.  I did send a message back about lab results that should have been addressed to the patient/wife.  If this was not done, we need to make sure this is being done.  If they are on my chart and primarily relying on MyChart messaging, I did send those results to my chart as well.  If they are not going to be looking at my chart then they need to be removed from my chart.  7-8 loose stools is still quite a bit.  Has this compared to what he was getting?  Was he getting 10-15 loose stools.  C. difficile can be difficult to treat.  If there is not much improvement we probably need to change to a different medication

## 2020-01-21 NOTE — Telephone Encounter (Signed)
Patient wife stated he does not have Diarrhea but he is going 7-8 times daily to and only having a small bowel movement. She stated her son was reviewing the results on mychart and had not informed her of them. We will just all from now on.

## 2020-01-22 ENCOUNTER — Other Ambulatory Visit: Payer: Self-pay | Admitting: Medical

## 2020-01-22 ENCOUNTER — Other Ambulatory Visit: Payer: Medicare Other | Admitting: *Deleted

## 2020-01-22 ENCOUNTER — Other Ambulatory Visit: Payer: Self-pay

## 2020-01-22 DIAGNOSIS — N184 Chronic kidney disease, stage 4 (severe): Secondary | ICD-10-CM

## 2020-01-22 DIAGNOSIS — I350 Nonrheumatic aortic (valve) stenosis: Secondary | ICD-10-CM

## 2020-01-22 LAB — BASIC METABOLIC PANEL
BUN/Creatinine Ratio: 17 (ref 10–24)
BUN: 54 mg/dL — ABNORMAL HIGH (ref 8–27)
CO2: 24 mmol/L (ref 20–29)
Calcium: 8.4 mg/dL — ABNORMAL LOW (ref 8.6–10.2)
Chloride: 105 mmol/L (ref 96–106)
Creatinine, Ser: 3.25 mg/dL — ABNORMAL HIGH (ref 0.76–1.27)
GFR calc Af Amer: 20 mL/min/{1.73_m2} — ABNORMAL LOW (ref >59)
GFR calc non Af Amer: 17 mL/min/{1.73_m2} — ABNORMAL LOW (ref >59)
Glucose: 91 mg/dL (ref 65–99)
Potassium: 4.1 mmol/L (ref 3.5–5.2)
Sodium: 143 mmol/L (ref 134–144)

## 2020-01-22 MED ORDER — CULTURELLE DIGESTIVE HEALTH PO CAPS: 1.0000 | ORAL_CAPSULE | Freq: Three times a day (TID) | ORAL | 0 refills | Status: AC

## 2020-01-22 MED ORDER — VANCOMYCIN HCL 125 MG PO CAPS: 125.0000 mg | ORAL_CAPSULE | Freq: Four times a day (QID) | ORAL | 0 refills | Status: DC

## 2020-01-22 NOTE — Telephone Encounter (Signed)
Patient wife will call Monday to let us know how he's doing.

## 2020-01-22 NOTE — Telephone Encounter (Signed)
I recommend a probiotic at this time.  Align is one example over the counter or IB Guard.   I think this would be helpful.     If worse loose stool over the weekend, I will send a different antibiotic called metronidazole in the event the stools continue 6-7+ per day.

## 2020-01-26 ENCOUNTER — Telehealth: Payer: Self-pay | Admitting: Cardiovascular Disease

## 2020-01-26 ENCOUNTER — Other Ambulatory Visit: Payer: Self-pay

## 2020-01-26 DIAGNOSIS — N184 Chronic kidney disease, stage 4 (severe): Secondary | ICD-10-CM

## 2020-01-26 NOTE — Telephone Encounter (Signed)
New Message:     Wife said pt is scheduled for test on 01-29-20. She said she thought he already had a Stress Test and did not need this one.

## 2020-01-27 ENCOUNTER — Telehealth: Payer: Self-pay | Admitting: Medical

## 2020-01-27 ENCOUNTER — Telehealth: Payer: Self-pay | Admitting: Cardiovascular Disease

## 2020-01-27 ENCOUNTER — Other Ambulatory Visit: Payer: Self-pay

## 2020-01-27 ENCOUNTER — Encounter: Payer: Self-pay | Admitting: Cardiovascular Disease

## 2020-01-27 ENCOUNTER — Ambulatory Visit: Payer: Medicare Other | Admitting: Cardiovascular Disease

## 2020-01-27 VITALS — BP 134/57 | HR 57 | Ht 74.0 in | Wt 166.0 lb

## 2020-01-27 DIAGNOSIS — I350 Nonrheumatic aortic (valve) stenosis: Secondary | ICD-10-CM | POA: Diagnosis not present

## 2020-01-27 MED ORDER — APIXABAN 2.5 MG PO TABS: 2.5000 mg | ORAL_TABLET | Freq: Two times a day (BID) | ORAL | 3 refills | Status: AC

## 2020-01-27 MED ORDER — METOPROLOL TARTRATE 25 MG PO TABS: 25.0000 mg | ORAL_TABLET | Freq: Two times a day (BID) | ORAL | 3 refills | Status: DC

## 2020-01-27 MED ORDER — HYDRALAZINE HCL 25 MG PO TABS: 25.0000 mg | ORAL_TABLET | Freq: Three times a day (TID) | ORAL | 3 refills | Status: AC

## 2020-01-27 NOTE — Progress Notes (Signed)
Cardiology Office Note:    Date:  01/28/2020   ID:  Terry Garner, DOB December 14, 1940, MRN [id number]  PCP:  Carlena Hurl, PA-C  [facility] Cardiologist:  Quay Burow, MD  [place] Electrophysiologist:  None   Referring MD: Carlena Hurl, PA-C   Chief Complaint  Patient presents with  . Shortness of Breath    History of Present Illness:    Terry Garner is a 79 y.o. male presenting for follow-up of aortic stenosis.  I initially saw the patient in outpatient consultation December 24, 2019.  He was seen for evaluation of severe, symptomatic aortic stenosis.  He is medically complex with known CAD and remote PCI of the LAD and diagonal branches, longstanding hypertension, mixed hyperlipidemia, and stage IV chronic kidney disease.  He also has moderate carotid stenosis and abdominal aortic aneurysm.  Echo assessment had demonstrated preserved LV function with severe aortic stenosis and moderate aortic insufficiency.  Transvalvular velocity was 4.2 m/s, mean gradient 41 mmHg, and aortic valve area 0.95 cm.  He was noted to have a moderate pericardial effusion.  At the time of my initial evaluation, he was noted to have progressive kidney disease with no recent nephrology follow-up.  He was more anemic with a hemoglobin less than 8 mg/dL.  He had reported poor appetite and weight loss.  I recommended that he reestablish with nephrology.  A few days after our visit, the patient was hospitalized with chest pain.  He had a flat, low-level troponin elevation and was treated medically.  He was noted to be in atrial fibrillation with RVR and converted spontaneously to sinus rhythm on metoprolol.  Apixaban was started at a reduced dose of 2.5 mg twice daily in the setting of his advanced CKD.  He was noted to be anemic during his admission and received 1 unit of packed red blood cells as well as IV iron and Aranesp.  An AV fistula was placed during his hospitalization.  He underwent further  TAVR evaluation with CTA studies of the heart as well as the chest, abdomen, and pelvis.  After discharge, the patient developed a diarrheal illness and was diagnosed with C. difficile colitis.  He was initially treated with Flagyl and then was transitioned to oral vancomycin because of continued diarrhea.  His symptoms persist but are slowly improving.  He has 10 days of oral vancomycin remaining.  The patient is here with his wife today.  He continues to have fatigue and intermittent shortness of breath.  He denies orthopnea, PND, or leg swelling.  He has had no recurrent chest pain.  He denies lightheadedness or syncope.  He has no heart palpitations.  His loose stools are slowly improving.  He denies any abdominal cramping at present.  Past Medical History:  Diagnosis Date  . AAA (abdominal aortic aneurysm) (HCC)    3.6 cm (01/09/12 ultrasound)  . Allergy   . Arthritis   . Bile duct leak 09/2017   post op  . Carotid artery disease ([place])   . Cataract   . Chronic kidney disease    CKD, right renal artery stenosis  . Coronary artery disease    mild left internal carotid artery stenosis  . Glaucoma   . Gout   . Heart murmur   . Hyperlipidemia   . Hypertension   . Tubular adenoma of colon 2003   Dr. Watt Climes  . Vitamin D deficiency     Past Surgical History:  Procedure Laterality Date  .  ABDOMINAL AORTOGRAM N/A 12/17/2019   Procedure: ABDOMINAL AORTOGRAM;  Surgeon: [name], MD;  Location: [place] [facility];  Service: Cardiovascular;  Laterality: N/A;  . AV FISTULA PLACEMENT Left 12/31/2019   Procedure: LEFT ARM BRACHIOCEPHALIC ARTERIOVENOUS (AV) FISTULA CREATION;  Surgeon: Serafina Mitchell, MD;  Location: [place];  Service: Vascular;  Laterality: Left;  . BACK SURGERY    . CARDIAC CATHETERIZATION     stents  last -07  . CARDIAC STENTS    . CERVICAL DISC SURGERY    . CHOLECYSTECTOMY N/A 09/26/2017   Procedure: LAPAROSCOPIC CHOLECYSTECTOMY;  Surgeon: Rolm Bookbinder, MD;   Location: [place];  Service: General;  Laterality: N/A;  . ERCP N/A 10/09/2017   Procedure: ENDOSCOPIC RETROGRADE CHOLANGIOPANCREATOGRAPHY (ERCP);  Surgeon: Milus Banister, MD;  Location[facility];  Service: Endoscopy;  Laterality: N/A;  . EYE SURGERY Left 06   retinal detachment  . HERNIA REPAIR Right 93  . IR RADIOLOGIST EVAL & MGMT  10/29/2017  . LUMBAR LAMINECTOMY  06/06/2012   Procedure: MICRODISCECTOMY LUMBAR LAMINECTOMY;  Surgeon: Marybelle Killings, MD;  Location: [place];  Service: Orthopedics;  Laterality: Left;  Left L4-5 Microdiscectomy  . RIGHT/LEFT HEART CATH AND CORONARY ANGIOGRAPHY N/A 12/17/2019   Procedure: RIGHT/LEFT HEART CATH AND CORONARY ANGIOGRAPHY;  Surgeon: [name], MD;  Location: [place] [facility];  Service: Cardiovascular;  Laterality: N/A;  . SHOULDER ARTHROSCOPY Right   . TOTAL HIP ARTHROPLASTY Left     Current Medications: Current Meds  Medication Sig  . allopurinol (ZYLOPRIM) 300 MG tablet TAKE 1 TABLET BY MOUTH ONCE DAILY (Patient taking differently: Take 300 mg by mouth daily. )  . amLODipine (NORVASC) 10 MG tablet Take 1 tablet (10 mg total) by mouth daily.  [name] apixaban (ELIQUIS) 2.5 MG TABS tablet Take 1 tablet (2.5 mg total) by mouth 2 (two) times daily.  . brimonidine (ALPHAGAN) 0.15 % ophthalmic solution Place 1 drop into both eyes 2 (two) times daily.  . Cholecalciferol (VITAMIN D3) 5000 UNITS TABS Take 5,000 Units by mouth daily.  . clopidogrel (PLAVIX) 75 MG tablet Take 1 tablet (75 mg total) by mouth daily.  . dorzolamide (TRUSOPT) 2 % ophthalmic solution Place 1 drop into the right eye 2 (two) times daily.   . fenofibrate micronized (LOFIBRA) 134 MG capsule TAKE 1 CAPSULE BY MOUTH ONCE DAILY (Patient taking differently: Take 134 mg by mouth daily before breakfast. )  . ferrous sulfate 325 (65 FE) MG tablet Take 325 mg by mouth daily with breakfast.  . furosemide (LASIX) 80 MG tablet Take 1 tablet (80 mg total) by mouth 2 (two) times daily.  .  hydrALAZINE (APRESOLINE) 25 MG tablet Take 1 tablet (25 mg total) by mouth 3 (three) times daily.  . isosorbide mononitrate (IMDUR) 30 MG 24 hr tablet Take 1 tablet (30 mg total) by mouth 2 (two) times daily.  [name] ketorolac (ACULAR) 0.5 % ophthalmic solution Place 1 drop into the right eye daily as needed (itching eye).   . Lactobacillus-Inulin ([place]) CAPS Take 1 capsule by mouth 3 (three) times daily.  . metoprolol tartrate (LOPRESSOR) 25 MG tablet Take 1 tablet (25 mg total) by mouth 2 (two) times daily.  . nepafenac (ILEVRO) 0.3 % ophthalmic suspension Place 1 drop into the right eye at bedtime.   . pravastatin (PRAVACHOL) 40 MG tablet TAKE 1 TABLET BY MOUTH AT BEDTIME FOR CHOLESTEROL (Patient taking differently: Take 40 mg by mouth daily. )  . triamcinolone cream (  KENALOG) 0.1 % Apply 1 application topically daily as needed (rash on Scalp).   . vancomycin (VANCOCIN) 125 MG capsule Take 1 capsule (125 mg total) by mouth 4 (four) times daily.  . [DISCONTINUED] apixaban (ELIQUIS) 2.5 MG TABS tablet Take 1 tablet (2.5 mg total) by mouth 2 (two) times daily.  . [DISCONTINUED] hydrALAZINE (APRESOLINE) 25 MG tablet Take 1 tablet (25 mg total) by mouth 3 (three) times daily.  . [DISCONTINUED] metoprolol tartrate (LOPRESSOR) 25 MG tablet Take 1 tablet (25 mg total) by mouth 2 (two) times daily.     Allergies:   Toprol xl [metoprolol tartrate], Lipitor [atorvastatin], Coumadin [warfarin sodium], Tape, and Warfarin   Social History   Socioeconomic History  . Marital status: Married    Spouse name: Not on file  . Number of children: Not on file  . Years of education: Not on file  . Highest education level: Not on file  Occupational History  . Not on file  Tobacco Use  . Smoking status: Never Smoker  . Smokeless tobacco: Never Used  Vaping Use  . Vaping Use: Never used  Substance and Sexual Activity  . Alcohol use: No  . Drug use: No  . Sexual activity: Not on file    Other Topics Concern  . Not on file  Social History Narrative  . Not on file   Social Determinants of Health   Financial Resource Strain:   . Difficulty of Paying Living Expenses:   Food Insecurity:   . Worried About Charity fundraiser in the Last Year:   . Arboriculturist in the Last Year:   Transportation Needs:   . Film/video editor (Medical):   [name] Lack of Transportation (Non-Medical):   Physical Activity:   . Days of Exercise per Week:   . Minutes of Exercise per Session:   Stress:   . Feeling of Stress :   Social Connections:   . Frequency of Communication with Friends and Family:   . Frequency of Social Gatherings with Friends and Family:   . Attends Religious Services:   . Active Member of Clubs or Organizations:   . Attends Archivist Meetings:   [name] Marital Status:      Family History: The patient's family history includes Diabetes in his brother; Heart disease in his brother, father, mother, and sister; Other in his brother; Stroke in his father. There is no history of Colon cancer, Esophageal cancer, Stomach cancer, or Rectal cancer.  ROS:   Please see the history of present illness.    All other systems reviewed and are negative.  EKGs/Labs/Other Studies Reviewed:    The following studies were reviewed today: Echo 01-12-2020: 1. Left ventricular ejection fraction, by estimation, is 60 to 65%. The  left ventricle has normal function. The left ventricle has no regional  wall motion abnormalities. There is moderate left ventricular hypertrophy.  Left ventricular diastolic  parameters are consistent with Grade II diastolic dysfunction  (pseudonormalization). Elevated left atrial pressure.  2. Right ventricular systolic function is normal. The right ventricular  size is normal. There is normal pulmonary artery systolic pressure.  3. Left atrial size was severely dilated.  4. The mitral valve is normal in structure. Mild mitral valve   regurgitation.  5. The inferior vena cava is dilated in size with <50% respiratory  variability, suggesting right atrial pressure of 15 mmHg.  6. The aortic valve is abnormal. Aortic valve regurgitation is moderate.  Severe aortic valve  stenosis. Vmax 4.0 m/s, MG 36 mmHg, AVA 0.8 cm^2, DI  0.25.  7. Moderate pericardial effusion measuring up to 1.2 cm adjacent to LV  lateral wall. Stable from prior echo on 12/09/19. IVC is fixed/dilated and  there is significant mitral inflow respiratory variation, but no RV  collapse to suggest tamponade.   CTA chest/abd/pelvis: VASCULAR MEASUREMENTS PERTINENT TO TAVR:  AORTA:  Minimal Aortic Diameter-20 x 17 mm  Severity of Aortic Calcification-moderate to severe  RIGHT PELVIS:  Right Common Iliac Artery -  Minimal Diameter-12.0 x 10.2 mm  Tortuosity-mild  Calcification-moderate  Right External Iliac Artery -  Minimal Diameter-7.0 x 7.4 mm  Tortuosity-moderate  Calcification-mild  Right Common Femoral Artery -  Minimal Diameter-7.8 x 6.6 mm  Tortuosity-mild  Calcification-moderate  LEFT PELVIS:  Left Common Iliac Artery -  Minimal Diameter-10.8 x 11.3 mm  Tortuosity-mild  Calcification-mild  Left External Iliac Artery -  Minimal Diameter-7.8 x 6.6 mm  Tortuosity-moderate  Calcification-none  Left Common Femoral Artery -  Minimal Diameter-7.0 x 7.5 mm  Tortuosity-mild  Calcification-moderate  Review of the MIP images confirms the above findings.  IMPRESSION: 1. Vascular findings and measurements pertinent to potential TAVR procedure, as detailed above. 2. Severe thickening calcification of the aortic valve, compatible with reported clinical history of severe aortic stenosis. 3. Aortic atherosclerosis, in addition to left main and 3 vessel coronary artery disease. There is also fusiform aneurysmal dilatation of the infrarenal abdominal aorta which measures up to 4.7 x  4.2 cm in diameter. Recommend followup by abdomen and pelvis CTA in 6 months, and vascular surgery referral/consultation if not already obtained. This recommendation follows ACR consensus guidelines: White Paper of the ACR Incidental Findings Committee II on Vascular Findings. J Am Coll Radiol 2013; 10:789-794. Aortic aneurysm NOS (ICD10-I71.9). 4. Mild cardiomegaly. 5. Moderate bilateral pleural effusions with partial loculation of a small portion of the right pleural effusion. 6. Morphologic changes in the liver suggestive of underlying cirrhosis with 2 ill-defined areas of hypervascularity. These may simply represent benign perfusion anomalies, however, given the cirrhotic changes, further evaluation with nonemergent abdominal MRI with and without IV gadolinium is strongly recommended in the near future to exclude underlying hepatocellular carcinoma. Correlation with AFP levels is also recommended. 7. Small volume of ascites. 8. Colonic and small bowel diverticulosis without evidence of acute diverticulitis at this time. 9. Additional incidental findings, as above.  Coronary CTA: FINDINGS: Aortic Valve: Trileaflet aortic valve with moderately calcified leaflets and moderately restricted leaflet opening. Only minimal calcifications are extending into the LVOT. Aortic valve calcium score is 1745.  Aorta: Normal caliber with moderate diffuse atherosclerotic plaque and calcifications. No dissection.  Sinotubular Junction: 30 x 28 mm  Ascending Thoracic Aorta: 33 x 32 mm  Aortic Arch: 28 x 26 mm  Descending Thoracic Aorta: 27 x 25 mm  Sinus of Valsalva Measurements:  Non-coronary: 33 mm  Right -coronary: 31 mm  Left -coronary: 34 mm  Coronary Artery Height above Annulus:  Left Main: 14 mm  Right Coronary: 15 mm  Virtual Basal Annulus Measurements:  Maximum/Minimum Diameter: 26.3 x 22.6 mm  Mean Diameter: 24.2 mm  Perimeter: 77.7 mm  Area:  460 mm2  Optimum Fluoroscopic Angle for Delivery:  LAO 6 CAU 6  IMPRESSION: 1. Trileaflet aortic valve with moderately calcified leaflets and moderately restricted leaflet opening. Only minimal calcifications are extending into the LVOT. Aortic valve calcium score is 1745. Annular measurements are suitable for delivery of a 26 mm Edwards-SAPIEN 3 Ultra valve.  2. Sufficient coronary to annulus distance.  3. Optimum Fluoroscopic Angle for Delivery: LAO 6 CAU 6.  4. No thrombus in the left atrial appendage.  5. Dilated pulmonary artery measuring 35 mm.  Cardiac Cath: Conclusion    Previously placed Prox LAD to Mid LAD stent (unknown type) is widely patent.  2nd Diag-1 lesion is 80% stenosed.  Previously placed 2nd Diag-2 stent (unknown type) is widely patent.  Hemodynamic findings consistent with aortic valve stenosis.  Ost LM lesion is 40% stenosed.  Dist LAD lesion is 60% stenosed.   Fick Cardiac Output 11.72 L/min  Fick Cardiac Output Index 5.85 (L/min)/BSA  Aortic Mean Gradient 18.85 mmHg  Aortic Peak Gradient 14 mmHg  Aortic Valve Area 2.85  Aortic Value Area Index 1.42 cm2/BSA  RA A Wave 7 mmHg  RA V Wave 2 mmHg  RA Mean 1 mmHg  RV Systolic Pressure 28 mmHg  RV Diastolic Pressure -1 mmHg  RV EDP 2 mmHg  PA Systolic Pressure 30 mmHg  PA Diastolic Pressure 7 mmHg  PA Mean 17 mmHg  PW A Wave 13 mmHg  PW V Wave 20 mmHg  PW Mean 11 mmHg  AO Systolic Pressure 161 mmHg  AO Diastolic Pressure 56 mmHg  AO Mean 096 mmHg  LV Systolic Pressure 045 mmHg  LV Diastolic Pressure 10 mmHg  LV EDP 16 mmHg  AOp Systolic Pressure 409 mmHg  AOp Diastolic Pressure 61 mmHg  AOp Mean Pressure 811 mmHg  LVp Systolic Pressure 914 mmHg  LVp Diastolic Pressure 14 mmHg  LVp EDP Pressure 25 mmHg  QP/QS 1  TPVR Index 2.91 HRUI  TSVR Index 18.13 HRUI  PVR SVR Ratio 0.06  TPVR/TSVR Ratio 0.16     EKG:  EKG is not ordered today.    Recent Labs: 12/28/2019: ALT 18;  B Natriuretic Peptide 1,117.2 01/08/2020: Hemoglobin 9.7; Platelets 230 01/22/2020: BUN 54; Creatinine, Ser 3.25; Potassium 4.1; Sodium 143  Recent Lipid Panel    Component Value Date/Time   CHOL 81 12/29/2019 0412   CHOL 98 (L) 11/13/2019 0837   TRIG 75 12/29/2019 0412   HDL 20 (L) 12/29/2019 0412   HDL 25 (L) 11/13/2019 0837   CHOLHDL 4.1 12/29/2019 0412   VLDL 15 12/29/2019 0412   LDLCALC 46 12/29/2019 0412   LDLCALC 54 11/13/2019 0837   LDLCALC 73 07/15/2017 1405    Physical Exam:    VS:  BP (!) 134/57   Pulse (!) 57   Ht 6\' 2"  (1.88 m)   Wt 166 lb (75.3 kg)   SpO2 95%   BMI 21.31 kg/m     Wt Readings from Last 3 Encounters:  01/27/20 166 lb (75.3 kg)  01/08/20 173 lb 6.4 oz (78.7 kg)  01/01/20 165 lb (74.8 kg)     GEN: Elderly, frail-appearing male in no acute distress HEENT: Normal NECK: No JVD; No carotid bruits LYMPHATICS: No lymphadenopathy CARDIAC: RRR, 3/6 harsh late peaking systolic murmur at the right upper sternal border with absent A2 RESPIRATORY:  Clear to auscultation without rales, wheezing or rhonchi  ABDOMEN: Soft, non-tender, non-distended MUSCULOSKELETAL:  No edema; No deformity  SKIN: Warm and dry NEUROLOGIC:  Alert and oriented x 3 PSYCHIATRIC:  Normal affect   ASSESSMENT:    1. Severe aortic stenosis    PLAN:    In order of problems listed above:  1. The patient has severe, stage D1 aortic stenosis.  As previously outlined, his symptoms, physical exam, and echo findings are all consistent with  severe symptomatic aortic stenosis.  Aortic valve replacement is indicated and considering his advanced age and comorbid medical conditions, most notably advanced kidney disease now with AV fistula placement, TAVR would be preferred over conventional surgical AVR.  I am concerned about the patient's overall condition with advanced age, weight loss, and C. difficile infection.  He is hemodynamically stable and appears to be tolerating adequate p.o.  intake at present.  I I think he will require some more time for recovery from his medical illness prior to moving forward with TAVR.  Will move his upcoming surgical consultation out a few more weeks.  He will complete a course of oral vancomycin and follow-up with his primary care physician for treatment of his C. difficile colitis.  He is advised to move his dental cleaning out until he further recovers as well.  From a perspective of TAVR planning, the patient does have an infrarenal abdominal aortic aneurysm noted on CTA.  I do not feel like this would be prohibitive for transfemoral access and he otherwise has adequate iliofemoral access.  I think he would be a candidate for transfemoral TAVR with a balloon expandable valve.  His cardiac catheterization, echo, and CTA studies were all reviewed today.  We will discuss further how to best manage his moderate sized pericardial effusion and I discussed this with the patient and his wife today.  Considerations would include ongoing observation versus pericardiocentesis at the time of TAVR.  He has no clinical signs of cardiac tamponade and this appears to be chronic over serial echo studies now.  Finally, the patient will undergo a PYP scan to rule out transthyretin amyloid in the setting of aortic stenosis, advanced age, pericardial effusion, and kidney disease.  The patient's myeloma panel did not demonstrate any evidence of a protein spike.  Further disposition pending his recovery from C. difficile colitis and upcoming office visit with Dr. Roxy Manns.  He will continue his current medical program without changes.   Medication Adjustments/Labs and Tests Ordered: Current medicines are reviewed at length with the patient today.  Concerns regarding medicines are outlined above.  No orders of the defined types were placed in this encounter.  Meds ordered this encounter  Medications  . metoprolol tartrate (LOPRESSOR) 25 MG tablet    Sig: Take 1 tablet (25 mg  total) by mouth 2 (two) times daily.    Dispense:  180 tablet    Refill:  3  . hydrALAZINE (APRESOLINE) 25 MG tablet    Sig: Take 1 tablet (25 mg total) by mouth 3 (three) times daily.    Dispense:  270 tablet    Refill:  3  . apixaban (ELIQUIS) 2.5 MG TABS tablet    Sig: Take 1 tablet (2.5 mg total) by mouth 2 (two) times daily.    Dispense:  180 tablet    Refill:  3    Patient Instructions  Medication Instructions:  Your provider recommends that you continue on your current medications as directed. Please refer to the Current Medication list given to you today.   *If you need a refill on your cardiac medications before your next appointment, please call your pharmacy*  Testing/Procedures: Your amyloid scan has been rescheduled to 02/11/2020 at 11:00AM. Please arrive at 10:45AM for this visit. The test is located at Dr. Antionette Char office on [facility].   Follow-Up: Your appointment with Dr. [name] has been cancelled.   Your appointment with Dr. Magnus Ivan has been rescheduled to Wednesday, August 25 at 11:00AM.  You are scheduled to see Dr. Roxy Manns on August 30 at 12:30PM. Please arrive at 12:15PM for this appointment.    Signed, Sherren Mocha, MD  01/28/2020 8:13 AM    [place]

## 2020-01-27 NOTE — Telephone Encounter (Signed)
His cardiologist Dr. Burt Knack called today.   patient was in the office today.  He is still drained and fatigued and not fully over his recent C. difficile issue.  He is still taking the vancomycin.  Cardiology was concerned about spread of infection as he has other cardiology visits and a dental hygiene visit tomorrow all upcoming.  We discussed not going to dental visit tomorrow and any other nonurgent visits need to wait for now as he is still on treatment.    I advised that we will reach out to Terry Garner as well

## 2020-01-27 NOTE — Telephone Encounter (Signed)
The patient was in today for visit.  Postponed amyloid scan, dental visit and Dr. Roxy Manns visits two weeks due to the patient still recovering from cdiff infection.  New appointment times given to patient and his wife at visit.

## 2020-01-27 NOTE — Telephone Encounter (Signed)
LVM for patient to return call to get follow up scheduled with [name] from recall list

## 2020-01-27 NOTE — Patient Instructions (Addendum)
Medication Instructions:  Your provider recommends that you continue on your current medications as directed. Please refer to the Current Medication list given to you today.   *If you need a refill on your cardiac medications before your next appointment, please call your pharmacy*  Testing/Procedures: Your amyloid scan has been rescheduled to 02/11/2020 at 11:00AM. Please arrive at 10:45AM for this visit. The test is located at Dr. Antionette Char office on [facility].   Follow-Up: Your appointment with Dr. [name] has been cancelled.   Your appointment with Dr. Magnus Ivan has been rescheduled to Wednesday, August 25 at 11:00AM.  You are scheduled to see Dr. Roxy Manns on August 30 at 12:30PM. Please arrive at 12:15PM for this appointment.

## 2020-01-28 ENCOUNTER — Encounter: Payer: Self-pay | Admitting: Cardiovascular Disease

## 2020-01-28 NOTE — Telephone Encounter (Signed)
Please call and let him and his wife know since we are still treating for C. difficile they really should remain at home, doing good cleaning around the house, cleaning services such as faucet handles, doorknobs, countertops, toilet seat toilet flushing knob, etc.   when possible use a bleach-based cleaner, otherwise disinfectt surfaces, and wash hands regularly with soap and water.  I recommend he do a follow-up here with somebody next week regarding the C. Difficile.  When I spoke to his cardiologist yesterday he still appeared to be recovering and not back to baseline.   They have put off some of his cardiology visits for couple weeks.  Hopefully he is already canceled his dental visit coming up which is routine.  This can be pushed out as well several weeks  Make an appointment with one of [place] here next week for follow-up

## 2020-01-28 NOTE — Telephone Encounter (Signed)
Spoke to patient wife and advised of provider recommendations. Patient has been scheduled for 02/12/20.

## 2020-01-29 ENCOUNTER — Ambulatory Visit (HOSPITAL_COMMUNITY)
Admission: RE | Admit: 2020-01-29 | Payer: Medicare Other | Source: Ambulatory Visit | Attending: Cardiovascular Disease | Admitting: Cardiovascular Disease

## 2020-02-04 ENCOUNTER — Encounter (INDEPENDENT_AMBULATORY_CARE_PROVIDER_SITE_OTHER): Payer: Medicare Other | Admitting: Ophthalmology

## 2020-02-04 ENCOUNTER — Other Ambulatory Visit: Payer: Self-pay

## 2020-02-04 MED ORDER — FENOFIBRATE MICRONIZED 134 MG PO CAPS: 134.0000 mg | ORAL_CAPSULE | Freq: Every day | ORAL | 1 refills | Status: DC

## 2020-02-04 MED ORDER — ALLOPURINOL 300 MG PO TABS: 300.0000 mg | ORAL_TABLET | Freq: Every day | ORAL | 1 refills | Status: DC

## 2020-02-08 ENCOUNTER — Other Ambulatory Visit: Payer: Self-pay

## 2020-02-08 ENCOUNTER — Encounter: Payer: Medicare Other | Admitting: Thoracic Surgery (Cardiothoracic Vascular Surgery)

## 2020-02-08 ENCOUNTER — Ambulatory Visit (INDEPENDENT_AMBULATORY_CARE_PROVIDER_SITE_OTHER): Payer: Medicare Other | Admitting: Physician Assistant

## 2020-02-08 ENCOUNTER — Ambulatory Visit (HOSPITAL_COMMUNITY)
Admission: RE | Admit: 2020-02-08 | Discharge: 2020-02-08 | Disposition: A | Discharge status: Home / Self Care | Payer: Medicare Other | Source: Ambulatory Visit | Attending: Surgery | Admitting: Surgery

## 2020-02-08 ENCOUNTER — Telehealth (HOSPITAL_COMMUNITY): Payer: Self-pay

## 2020-02-08 VITALS — BP 138/50 | HR 43 | Temp 98.0°F | Resp 20 | Ht 74.0 in | Wt 165.0 lb

## 2020-02-08 DIAGNOSIS — N184 Chronic kidney disease, stage 4 (severe): Secondary | ICD-10-CM | POA: Diagnosis not present

## 2020-02-08 NOTE — Progress Notes (Signed)
Let's reduce metoprolol to 12.5 mg BID

## 2020-02-08 NOTE — Progress Notes (Signed)
    Postoperative Access Visit   History of Present Illness   KIPPY GOHMAN is a 79 y.o. year old male who presents for postoperative follow-up for: left brachiocephalic arteriovenous fistula by Dr. Trula Slade (Date: 12/31/19).  The patient's wounds are well healed.  The patient denies steal symptoms.  The patient is able to complete their activities of daily living.  He is not yet on dialysis.  He is being worked up for TAVR.   Physical Examination   Vitals:   02/08/20 1522  BP: (!) 138/50  Pulse: (!) 43  Resp: 20  Temp: 98 F (36.7 C)  TempSrc: Temporal  SpO2: 98%  Weight: 165 lb (74.8 kg)  Height: 6\' 2"  (1.88 m)   Body mass index is 21.18 kg/m.  left arm Incision is healed, palpble radial pulse, hand grip is 5/5, sensation in digits is intact, palpable thrill, bruit can be auscultated     Medical Decision Making   MACKEY VARRICCHIO is a 79 y.o. year old male who presents s/p left brachiocephalic arteriovenous fistula   Patent L brachiocephalic fistula without signs or symptoms of steal syndrome  The patient's access will be ready for use 03/24/20  The patient may follow up on a prn basis   Dagoberto Ligas PA-C [facility] Office: [facility] MD: Trula Slade

## 2020-02-08 NOTE — Telephone Encounter (Signed)
Poke to the patient's wide and reminded her that her husband has a test with [place] on 02/11/2020 at 11:00 am. She stated that she understood and would make sure that he is here. S.Ezra Denne EMTP

## 2020-02-09 ENCOUNTER — Telehealth: Payer: Self-pay

## 2020-02-09 MED ORDER — METOPROLOL TARTRATE 25 MG PO TABS: 12.5000 mg | ORAL_TABLET | Freq: Two times a day (BID) | ORAL | 3 refills | Status: AC

## 2020-02-09 NOTE — Telephone Encounter (Signed)
When reviewing the pt's office note from [facility] it was documented that the pt's pulse was 43 and the pt's pulse has been slow at recent visits.  The pt is currently taking Metoprolol Tartrate 25mg  one tablet by mouth twice a day.  Dr Burt Knack reviewed and would like the pt to decrease Metoprolol Tartrate to 12.5mg  twice a day. The pt will cut his current tablet in half and begin taking a half tablet by mouth twice a day.  Medication list updated and wife verbalized understanding of medication change.

## 2020-02-11 ENCOUNTER — Ambulatory Visit (HOSPITAL_COMMUNITY): Payer: Medicare Other | Attending: Cardiology

## 2020-02-11 ENCOUNTER — Other Ambulatory Visit: Payer: Self-pay

## 2020-02-11 DIAGNOSIS — I350 Nonrheumatic aortic (valve) stenosis: Secondary | ICD-10-CM

## 2020-02-11 DIAGNOSIS — R0600 Dyspnea, unspecified: Secondary | ICD-10-CM | POA: Diagnosis not present

## 2020-02-11 DIAGNOSIS — N184 Chronic kidney disease, stage 4 (severe): Secondary | ICD-10-CM | POA: Diagnosis not present

## 2020-02-11 DIAGNOSIS — I25118 Atherosclerotic heart disease of native coronary artery with other forms of angina pectoris: Secondary | ICD-10-CM | POA: Diagnosis not present

## 2020-02-11 DIAGNOSIS — I313 Pericardial effusion (noninflammatory): Secondary | ICD-10-CM | POA: Diagnosis not present

## 2020-02-11 DIAGNOSIS — R0609 Other forms of dyspnea: Secondary | ICD-10-CM

## 2020-02-11 DIAGNOSIS — I3139 Other pericardial effusion (noninflammatory): Secondary | ICD-10-CM

## 2020-02-11 MED ORDER — TECHNETIUM TC 99M PYROPHOSPHATE
20.2000 | Freq: Once | INTRAVENOUS | Status: AC
  Administered 2020-02-11: 20.2 via INTRAVENOUS

## 2020-02-12 ENCOUNTER — Ambulatory Visit (INDEPENDENT_AMBULATORY_CARE_PROVIDER_SITE_OTHER): Payer: Medicare Other | Admitting: Medical

## 2020-02-12 ENCOUNTER — Encounter: Payer: Self-pay | Admitting: Medical

## 2020-02-12 VITALS — BP 138/62 | HR 46 | Ht 74.0 in | Wt 157.0 lb

## 2020-02-12 DIAGNOSIS — R932 Abnormal findings on diagnostic imaging of liver and biliary tract: Secondary | ICD-10-CM

## 2020-02-12 DIAGNOSIS — R634 Abnormal weight loss: Secondary | ICD-10-CM

## 2020-02-12 DIAGNOSIS — D649 Anemia, unspecified: Secondary | ICD-10-CM | POA: Diagnosis not present

## 2020-02-12 DIAGNOSIS — D692 Other nonthrombocytopenic purpura: Secondary | ICD-10-CM

## 2020-02-12 DIAGNOSIS — N184 Chronic kidney disease, stage 4 (severe): Secondary | ICD-10-CM

## 2020-02-12 DIAGNOSIS — A0472 Enterocolitis due to Clostridium difficile, not specified as recurrent: Secondary | ICD-10-CM

## 2020-02-12 DIAGNOSIS — Z9289 Personal history of other medical treatment: Secondary | ICD-10-CM | POA: Diagnosis not present

## 2020-02-12 DIAGNOSIS — I350 Nonrheumatic aortic (valve) stenosis: Secondary | ICD-10-CM

## 2020-02-12 DIAGNOSIS — E441 Mild protein-calorie malnutrition: Secondary | ICD-10-CM

## 2020-02-12 DIAGNOSIS — I779 Disorder of arteries and arterioles, unspecified: Secondary | ICD-10-CM

## 2020-02-12 DIAGNOSIS — I701 Atherosclerosis of renal artery: Secondary | ICD-10-CM

## 2020-02-12 DIAGNOSIS — R630 Anorexia: Secondary | ICD-10-CM

## 2020-02-12 MED ORDER — MEGESTROL ACETATE 400 MG/10ML PO SUSP: 400.0000 mg | Freq: Every day | ORAL | 2 refills | Status: DC

## 2020-02-12 NOTE — Addendum Note (Signed)
Addended by: Emmie Niemann on: 02/12/2020 12:59 PM   Modules accepted: Orders

## 2020-02-12 NOTE — Progress Notes (Signed)
Subjective:  Terry Garner is a 79 y.o. male who presents for Chief Complaint  Patient presents with  . Follow-up    c dif      Here for follow-up on C. difficile infection.  Medical team: Dr. Quay Burow, cardiology Dr. Zigmund Daniel, eye doctor and Dr. Katy Fitch Dentist Dr. Servando Salina, nephrology Dr. Darylene Price with cardiothoracic surgery, upcoming new patient appt Dr. Sherren Mocha, cardiothoracic surgery Dr. Harold Barban, vascualr surgery Camara Rosander, Camelia Eng, PA-C here for primary care  I saw him back on January 08, 2020 for hospital follow-up.  He was hospitalized for angina, severe aortic stenosis, CAD, A. fib new diagnosis, anemia of chronic disease.  However he had noticed some diarrhea since being in the hospital, 5 loose stools in the night 57 loose stools in a day.  He was found to be C. difficile and Sapovirus positive.  We initiated metronidazole 500 mg 3 times a day for 10 days.  He did see some improvement but not fully resolved with still moderate amount of loose stools and not feeling energetic, was feeling quite drained and fatigued.  We spoke to him on July 28 and advise probiotic.  He called back a few days later still complaining of frequent stools so we then initiated oral vancomycin 125 mg 4 times daily on July 30.  Today he reports that he is still having a lot of problem with his bowels. He is having numerous bowel movements throughout the day in excess of 6 during the day and several during the night. He says he feels like he is having bowel movements every 30 to 40 minutes. Sometimes are solid sometimes soft sometimes loose. No blood in the stool. He is using the probiotic but is not helping. He finished his vancomycin last week.  No fever. No body aches or chills. He is drinking fluids throughout the day. He does not have very much appetite. He has not had an appetite for quite some well for months. He would like something to help with appetite.  No other aggravating  or relieving factors.    No other c/o.  The following portions of the patient's history were reviewed and updated as appropriate: allergies, current medications, past family history, past medical history, past social history, past surgical history and problem list.  ROS Otherwise as in subjective above    Objective: BP 138/62   Pulse (!) 46   Ht $R'6\' 2"'se$  (1.88 m)   Wt 157 lb (71.2 kg)   SpO2 98%   BMI 20.16 kg/m   Wt Readings from Last 3 Encounters:  02/12/20 157 lb (71.2 kg)  02/08/20 165 lb (74.8 kg)  01/27/20 166 lb (75.3 kg)   General appearance: alert, no distress, well developed, white male Conjunctiva pink and moist Oral cavity: MMM, no lesions No skin tenting but skin consistent with senile purpura and thin skin Abdomen: +bs, soft, non tender, non distended, no masses, no hepatomegaly, no splenomegaly Ext: no edema   Assessment: Encounter Diagnoses  Name Primary?  . C. difficile enteritis Yes  . Chronic anemia   . Abnormal liver scan   . History of recent hospitalization   . Severe aortic stenosis   . Bilateral carotid artery disease, unspecified type ([place])   . Renal artery stenosis ([place])   . CKD (chronic kidney disease) stage 4, GFR 15-29 ml/min (HCC)   . Appetite impaired   . Weight loss   . Senile purpura ([place])   . Malnutrition of mild degree (  [place])      Plan: C. difficile infection along with positive Sapovirus on PCR GI stool profile 01/08/2020.  He has now completed a full course of metronidazole followed by vancomycin separately after symptoms did not significantly improve with metronidazole.  Still no improvement really.  We will recheck stool.  Sent home with stool collection kit.  I will go ahead and make referral to infectious disease.  Continue probiotic.  Avoid greasy fried and spicy foods.  We discussed hygiene and continuing to use precautions and stay at home if possible.  Discussed the contagious nature of this infection.  Regarding last visit  hospital follow-up and review of records, he had questionable cirrhosis-on CT scanning during this hospitalization there was some concern for cirrhosis of the liver.  This was deferred to outpatient.    He was negative hepatitis C, hepatitis B on 01/08/2020, normal AFP tumor marker.   Referral to gastroenterology for further evaluation and management, but this referral will be scheduled several weeks now to give him time to get through this C. difficile issue and the cardiac follow-up.  Lack of appetite, weight loss, malnutrition -begin Megace.  Discussed risk and benefits of medication, proper use of medication.  Angina pectoris-improved after nitro infusion, troponin's are slightly elevated.  Aspirin was discontinued by cardiology but he was continued on Plavix and metoprolol.  He was transitioned to  Imdur 30 mg twice daily  Severe aortic stenosis, Atrial fibrillation - managed by cardiology, follow up planned but awaiting c diff resolution  CKD 4, s/p L brachiocephalic fitula, hopefully read for use by 03/24/20  - recent shunt/vascular access still healing, managed by vascular team and nephrology   [place] was seen today for follow-up.  Diagnoses and all orders for this visit:  C. difficile enteritis -     Ambulatory referral to Infectious Disease -     Clostridium Difficile by PCR; Future  Chronic anemia -     Ambulatory referral to Gastroenterology  Abnormal liver scan -     Ambulatory referral to Gastroenterology  History of recent hospitalization  Severe aortic stenosis  Bilateral carotid artery disease, unspecified type (HCC)  Renal artery stenosis (HCC)  CKD (chronic kidney disease) stage 4, GFR 15-29 ml/min (HCC)  Appetite impaired  Weight loss  Senile purpura (HCC)  Malnutrition of mild degree (HCC)  Other orders -     megestrol (MEGACE) 400 MG/10ML suspension; Take 10 mLs (400 mg total) by mouth daily.    Follow up: pending lab, referrals

## 2020-02-13 LAB — CLOSTRIDIUM DIFFICILE BY PCR: Toxigenic C. Difficile by PCR: POSITIVE — AB

## 2020-02-15 ENCOUNTER — Encounter (INDEPENDENT_AMBULATORY_CARE_PROVIDER_SITE_OTHER): Payer: Medicare Other | Admitting: Ophthalmology

## 2020-02-15 ENCOUNTER — Other Ambulatory Visit: Payer: Self-pay

## 2020-02-15 DIAGNOSIS — I10 Essential (primary) hypertension: Secondary | ICD-10-CM

## 2020-02-15 DIAGNOSIS — H348122 Central retinal vein occlusion, left eye, stable: Secondary | ICD-10-CM

## 2020-02-15 DIAGNOSIS — H34231 Retinal artery branch occlusion, right eye: Secondary | ICD-10-CM

## 2020-02-15 DIAGNOSIS — H35033 Hypertensive retinopathy, bilateral: Secondary | ICD-10-CM | POA: Diagnosis not present

## 2020-02-15 DIAGNOSIS — H34811 Central retinal vein occlusion, right eye, with macular edema: Secondary | ICD-10-CM | POA: Diagnosis not present

## 2020-02-15 DIAGNOSIS — H43813 Vitreous degeneration, bilateral: Secondary | ICD-10-CM

## 2020-02-15 DIAGNOSIS — H353122 Nonexudative age-related macular degeneration, left eye, intermediate dry stage: Secondary | ICD-10-CM

## 2020-02-16 ENCOUNTER — Ambulatory Visit: Payer: Medicare Other | Admitting: Cardiovascular Disease

## 2020-02-16 ENCOUNTER — Encounter: Payer: Self-pay | Admitting: Medical

## 2020-02-16 ENCOUNTER — Telehealth: Payer: Self-pay | Admitting: Medical

## 2020-02-16 NOTE — Telephone Encounter (Signed)
P.A. MEGESTROL SUSPENSION

## 2020-02-17 DIAGNOSIS — Z0120 Encounter for dental examination and cleaning without abnormal findings: Secondary | ICD-10-CM | POA: Diagnosis not present

## 2020-02-17 NOTE — Telephone Encounter (Signed)
P.A. Isabelle Course, only paid if pt has cancer or AIDS. Appeal letter typed & faxed

## 2020-02-18 IMAGING — MR MR 3D RECON AT SCANNER
11 series · 16 of 16 positions shown · non-contrast
Comparison: 09/23/2017 unenhanced CT abdomen/pelvis and right upper
quadrant abdominal sonogram.

CLINICAL DATA: Inpatient. Epigastric/right upper quadrant abdominal
pain. Abnormal liver function tests. Cholelithiasis.

EXAM:
MRI ABDOMEN WITHOUT CONTRAST  (INCLUDING MRCP)
TECHNIQUE: Multiplanar multisequence MR imaging of the abdomen was performed.
Heavily T2-weighted images of the biliary and pancreatic ducts were
obtained, and three-dimensional MRCP images were rendered by post
processing.

[Series 3001: ax haste · axial · 6.0mm · 1.19mm/px · 1 of 35 slices shown]
[im 1/35]
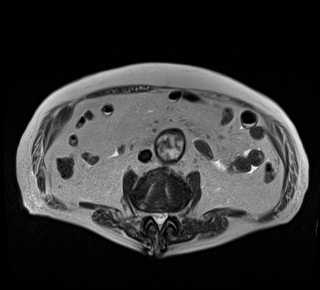

[Series 4001: bSSFP · coronal · 6.0mm · 0.74mm/px · 1 of 33 slices shown]
[im 1/33]
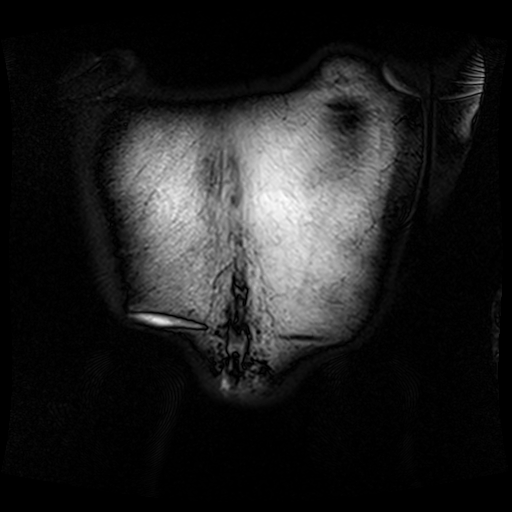

[Series 8001: DWI · axial · 6.0mm · 1.42mm/px · z∈[-106,+139]mm · 2 of 105 slices shown (1 of 2)]
[im 1/105]
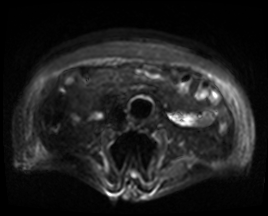
[im 105/105]
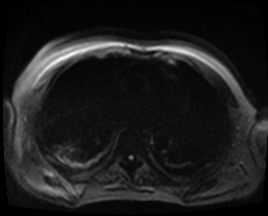

[Series 9001: DWI · axial · 6.0mm · 1.42mm/px · 1 of 35 slices shown (2 of 2)]
[im 1/35]
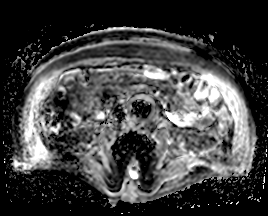

[ax in and · axial · 3.0mm · 1.19mm/px · z∈[-97,+140]mm · 3 of 160 slices shown]
[im 1/160]
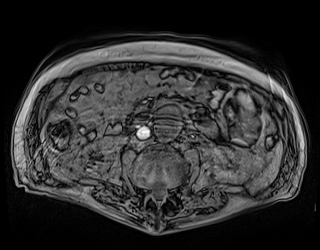
[im 80/160]
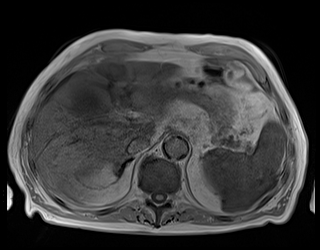
[im 160/160]
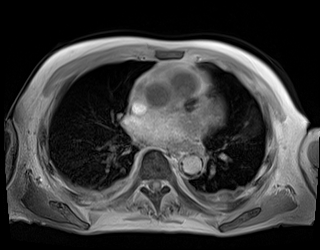

[MRCP · coronal · 1.3mm · 0.59mm/px · 2 of 72 slices shown (1 of 2)]
[im 1/72]
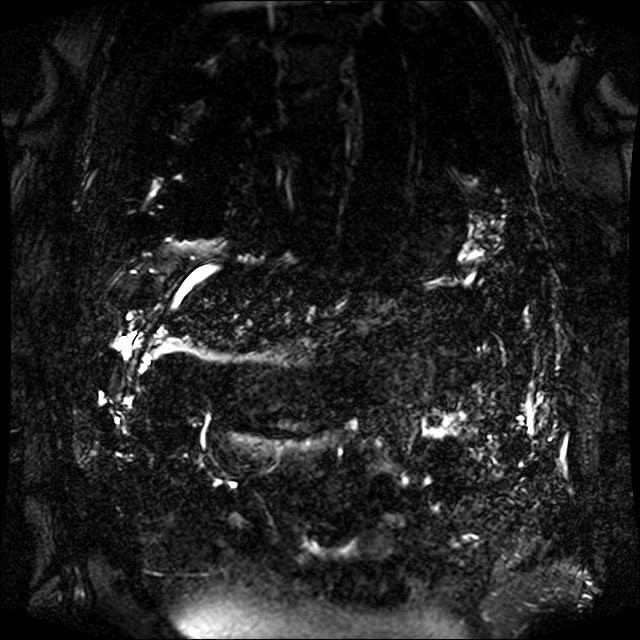
[im 72/72]
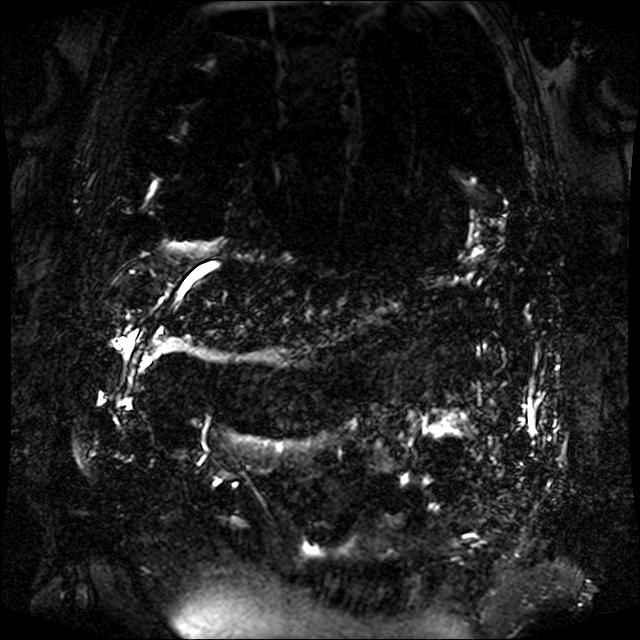

[3d mrcp_mip_radials · sagittal · 0.59mm/px · 1 of 19 slices shown]
[im 1/19]
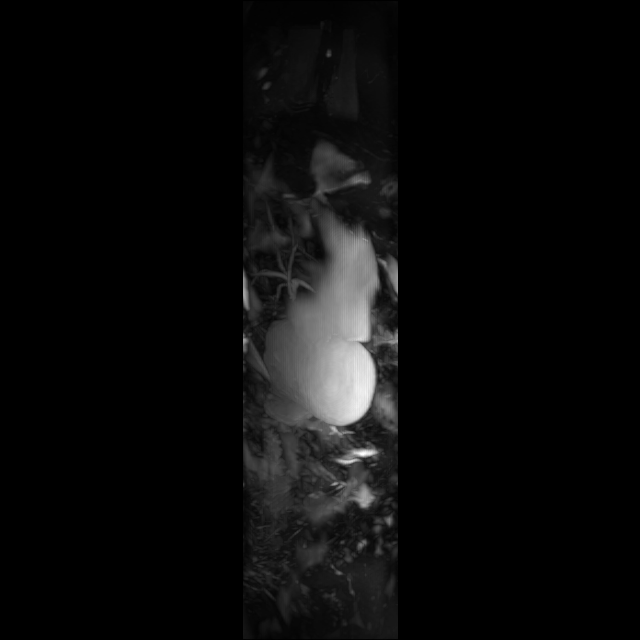

[MRCP · coronal · 4.0mm · 1.12mm/px · 1 of 16 slices shown (2 of 2)]
[im 1/16]
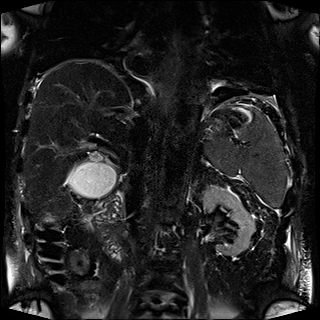

[radials · coronal · 50.0mm · 0.78mm/px · 1 of 5 slices shown]
[im 1/5]
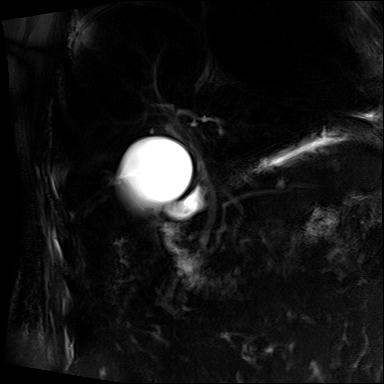

[T2 fat-sat · axial · 6.0mm · 1.19mm/px · 1 of 35 slices shown]
[im 1/35]
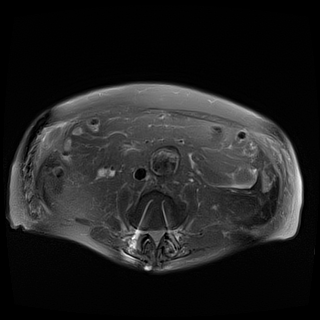

[T1 dynamic · axial · non-contrast · 3.0mm · 1.19mm/px · z∈[-99,+138]mm · 2 of 80 slices shown]
[im 1/80]
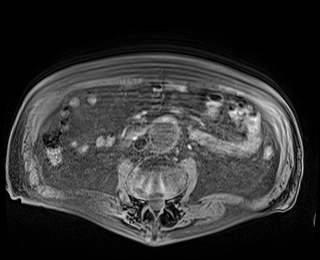
[im 80/80]
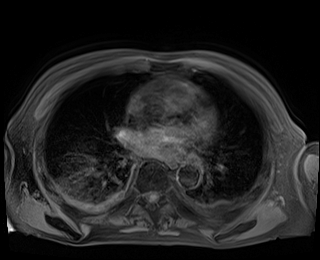

[16 of 16 positions shown; findings below may reference images not displayed]

FINDINGS: Lower chest: Hypoventilatory changes in the dependent lung bases
bilaterally.

Hepatobiliary: Normal liver size and configuration. No hepatic
steatosis. There is a sub 5 mm T2 hyperintense posterior right liver
lobe lesion, incompletely characterized on this noncontrast scan,
for which no follow-up is required unless the patient has risk
factors for liver malignancy. No additional liver lesions. Distended
gallbladder (5.3 cm diameter). Layering sludge and tiny 2-3 mm
gallstones in the gallbladder. Borderline mild diffuse gallbladder
wall thickening (gallbladder wall thickness 3 mm). No
pericholecystic fluid. No biliary ductal dilatation. Common bile
duct diameter 6 mm. Small amount of layering sludge in the common
bile duct, with no filling defects in the common bile duct to
suggest choledocholithiasis. Small periampullary duodenal
diverticulum.

Pancreas: There is a 0.8 cm cystic pancreatic body lesion (series
31993/image 52) without appreciable wall thickening or internal
complexity. No additional pancreatic lesions. No pancreatic duct
dilation. No convincing pancreas divisum (the main pancreatic duct
is drained by an accessory duct of [place], however also appears
to communicate with the common bile duct).

Spleen: Normal size. No mass.

Adrenals/Urinary Tract: No adrenal nodules. No hydronephrosis.
Normal kidneys with no renal mass.

Stomach/Bowel: Grossly normal stomach. Visualized small and large
bowel is normal caliber, with no bowel wall thickening.

Vascular/Lymphatic: Infrarenal 4.7 cm abdominal aortic aneurysm,
stable. No pathologically enlarged lymph nodes in the abdomen.

Other: Trace perihepatic ascites.  No focal fluid collection.

Musculoskeletal: No aggressive appearing focal osseous lesions.
Bilateral posterior spinal fusion hardware is visualized in the
lower lumbar spine.
IMPRESSION: 1. Layering sludge and tiny gallstones in the distended gallbladder
with borderline mild diffuse gallbladder wall thickening. No
pericholecystic fluid. These findings are nonspecific and could be
compatible with acute cholecystitis in the correct clinical setting.
2. No biliary ductal dilatation. CBD diameter 6 mm. Small amount of
layering sludge in the CBD, with no evidence of choledocholithiasis.
3. Cystic 0.8 cm pancreatic body mass without overtly aggressive
noncontrast MRI features. Follow-up MRI abdomen without and with IV
contrast recommended in 2 years. This recommendation follows ACR
consensus guidelines: Management of Incidental Pancreatic Cysts: A
White Paper of the ACR Incidental Findings Committee. [HOSPITAL] 4212;[DATE].
4. Infrarenal 4.7 cm abdominal aortic aneurysm. Recommend followup
by abdomen and pelvis CTA in 6 months, and vascular surgery
referral/consultation if not already obtained. This recommendation
follows ACR consensus guidelines: White Paper of the ACR Incidental
Findings Committee II on Vascular Findings. [HOSPITAL] 8848;
5. Trace perihepatic ascites.

## 2020-02-19 ENCOUNTER — Other Ambulatory Visit: Payer: Self-pay

## 2020-02-19 MED ORDER — ISOSORBIDE MONONITRATE ER 30 MG PO TB24: 30.0000 mg | ORAL_TABLET | Freq: Two times a day (BID) | ORAL | 2 refills | Status: DC

## 2020-02-22 ENCOUNTER — Encounter: Payer: Medicare Other | Admitting: Thoracic Surgery (Cardiothoracic Vascular Surgery)

## 2020-02-22 NOTE — Telephone Encounter (Signed)
Appeal denied, again only covered if Rx for Aids, Cancer or cystic fibrosis.  Faxed level 2 appeal. Terry Garner would you like to prescribe anything else in the meantime?

## 2020-02-23 ENCOUNTER — Other Ambulatory Visit: Payer: Self-pay | Admitting: Medical

## 2020-02-23 MED ORDER — CYPROHEPTADINE HCL 4 MG PO TABS: 4.0000 mg | ORAL_TABLET | Freq: Two times a day (BID) | ORAL | 0 refills | Status: DC

## 2020-02-23 NOTE — Telephone Encounter (Signed)
Insurance will not cover Megace  I sent an alternate called cyproheptadine which can be used for appetite. We can try this on just try to work on getting nutrients throughout the day and monitor weight  What is the status on infectious disease referral as I need him to be seen ASAP.

## 2020-02-24 ENCOUNTER — Other Ambulatory Visit: Payer: Self-pay

## 2020-02-24 ENCOUNTER — Encounter: Payer: Self-pay | Admitting: Internal Medicine

## 2020-02-24 ENCOUNTER — Ambulatory Visit: Payer: Medicare Other | Admitting: Internal Medicine

## 2020-02-24 ENCOUNTER — Telehealth: Payer: Self-pay | Admitting: Pharmacy Technician

## 2020-02-24 DIAGNOSIS — A0831 Calicivirus enteritis: Secondary | ICD-10-CM | POA: Diagnosis not present

## 2020-02-24 DIAGNOSIS — A0472 Enterocolitis due to Clostridium difficile, not specified as recurrent: Secondary | ICD-10-CM

## 2020-02-24 MED ORDER — FIDAXOMICIN 200 MG PO TABS: 200.0000 mg | ORAL_TABLET | Freq: Two times a day (BID) | ORAL | 0 refills | Status: DC

## 2020-02-24 NOTE — Telephone Encounter (Signed)
[facility] Patient Advocate Encounter  Completed and sent [employer] application for Dificid for this patient who is insured but needs assistance because medication is not on formulary.    He is approved through June 24, 2020.  They will expedite shipment and it will arrive to his home either tomorrow or the next day.  Venida Jarvis. Nadara Mustard [place] Patient [facility] Phone: [phone] Fax:  [phone]

## 2020-02-24 NOTE — Patient Instructions (Signed)
You have diarrhea caused by 2 different organisms, C. difficile and Sapovirus.

## 2020-02-24 NOTE — Progress Notes (Signed)
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 difficile and Sapovirus, which is in the norovirus family.  I will treat his C. difficile infection with fidaxomycin.  Treatment for Sapovirus is supportive.  Plan: 1. Fidaxomicin 200 mg twice daily for 10 days 2. Follow-up here in 4 weeks  Patient Active Problem List   Diagnosis Date Noted  . Enzyme immunoassay (EIA) positive for sapovirus 02/24/2020    Priority: High  . C. difficile enteritis 02/12/2020    Priority: High  . Weight loss 02/12/2020    Priority: Medium  . Appetite impaired 02/12/2020  . Senile purpura ([place]) 02/12/2020  . Malnutrition of mild degree ([place]) 02/12/2020  . Anemia in stage 4 chronic kidney disease ([place]) 01/08/2020  . Loose stools 01/08/2020  . DOE (dyspnea on exertion) 01/08/2020  . Abnormal liver scan 01/08/2020  . History of recent hospitalization 01/08/2020  . Angina pectoris ([place]) 01/01/2020  . Coronary artery disease of native artery of native heart with stable angina pectoris ([place])   . Severe aortic stenosis 12/10/2019  . Pain in a tooth or teeth 06/01/2019  . Abnormal weight loss 06/01/2019  . 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 Hearing loss 06/01/2019  . Renal artery stenosis ([place]) 01/09/2019  . Herpes zoster without complication 78/46/9629  . Bile leak 10/08/2017  . Chronic anemia 08/19/2015  . CKD (chronic kidney disease) stage 4, GFR 15-29 ml/min (HCC) 08/19/2015  . BMI 23.0-23.9, adult 05/17/2015  . Abdominal aortic aneurysm ([place]) 12/02/2013  . Medication management 09/01/2013  . Uncontrolled hypertension 07/17/2013  . Other abnormal glucose 07/17/2013  . Gout   . Vitamin D deficiency   . 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 Carotid artery disease ([place]) 01/15/2013  . Right bundle branch block 01/15/2013  . Hyperlipidemia 01/15/2013  .  Herniated lumbar intervertebral disc 06/06/2012    Patient's Medications  New Prescriptions   FIDAXOMICIN (DIFICID) 200 MG TABS TABLET    Take 1 tablet (200 mg total) by mouth 2 (two) times daily.  Previous Medications   ALLOPURINOL (ZYLOPRIM) 300 MG TABLET    Take 1 tablet (300 mg total) by mouth daily.   AMLODIPINE (NORVASC) 10 MG TABLET    Take 1 tablet (10 mg total) by mouth daily.   APIXABAN (ELIQUIS) 2.5 MG TABS TABLET    Take 1 tablet (2.5 mg total) by mouth 2 (two) times daily.   BRIMONIDINE (ALPHAGAN) 0.15 % OPHTHALMIC SOLUTION    Place 1 drop into both eyes 2 (two) times daily.   CHOLECALCIFEROL (VITAMIN D3) 5000 UNITS TABS    Take 5,000 Units by mouth daily.   CLOPIDOGREL (PLAVIX) 75 MG TABLET    Take 1 tablet (75 mg total) by mouth daily.   CYPROHEPTADINE (PERIACTIN) 4 MG TABLET    Take 1 tablet (4 mg total) by mouth 2 (two) times daily.   DORZOLAMIDE (TRUSOPT) 2 % OPHTHALMIC SOLUTION    Place 1 drop into the right eye 2 (two) times daily.    FENOFIBRATE MICRONIZED (LOFIBRA) 134 MG CAPSULE    Take 1 capsule (134 mg total) by mouth daily.   FERROUS SULFATE 325 (65 FE) MG TABLET    Take 325 mg by mouth daily with breakfast.   FUROSEMIDE (LASIX) 80 MG TABLET    Take 1 tablet (80 mg total) by mouth 2 (two) times daily.  HYDRALAZINE (APRESOLINE) 25 MG TABLET    Take 1 tablet (25 mg total) by mouth 3 (three) times daily.   ISOSORBIDE MONONITRATE (IMDUR) 30 MG 24 HR TABLET    Take 1 tablet (30 mg total) by mouth 2 (two) times daily.   KETOROLAC (ACULAR) 0.5 % OPHTHALMIC SOLUTION    Place 1 drop into the right eye daily as needed (itching eye).    LACTOBACILLUS-INULIN ([place]) CAPS    Take 1 capsule by mouth 3 (three) times daily.   METOPROLOL TARTRATE (LOPRESSOR) 25 MG TABLET    Take 0.5 tablets (12.5 mg total) by mouth 2 (two) times daily.   NEPAFENAC (ILEVRO) 0.3 % OPHTHALMIC SUSPENSION    Place 1 drop into the right eye at bedtime.    PRAVASTATIN (PRAVACHOL) 40  MG TABLET    TAKE 1 TABLET BY MOUTH AT BEDTIME FOR CHOLESTEROL   TRIAMCINOLONE CREAM (KENALOG) 0.1 %    Apply 1 application topically daily as needed (rash on Scalp).   Modified Medications   No medications on file  Discontinued Medications   No medications on file    HPI: Terry Garner is a 79 y.o. male with multiple medical problems who was hospitalized in June and July with angina pectoris.  He was noted to have severe aortic stenosis.  Shortly after his second discharge he developed diarrhea.  He tested positive for C. difficile and Sapovirus on 01/08/2020.  He received a 10-day course of metronidazole without benefit.  He was started on align probiotic at the end of July.  He then took oral vancomycin 125 mg 4 times daily for 3 weeks, completing therapy on 02/12/2020.  He has continued to have bowel movements about every 30 minutes both day and night.  He has had some problems with fecal incontinence.  He has had abdominal cramps.  He has not had any fever, nausea or vomiting.  His appetite is very poor and he has lost about 9 pounds.  He has not had any recent antibiotic treatment prior to developing diarrhea.  His wife has not been ill.  He received his Pfizer Covid vaccines earlier this year.  Review of Systems: Review of Systems  Constitutional: Positive for malaise/fatigue and weight loss. Negative for chills, diaphoresis and fever.  Respiratory: Negative for cough.   Cardiovascular: Negative for chest pain.  Gastrointestinal: Positive for abdominal pain and diarrhea. Negative for blood in stool, constipation, nausea and vomiting.      Past Medical History:  Diagnosis Date  . AAA (abdominal aortic aneurysm) (HCC)    3.6 cm (01/09/12 ultrasound)  . Allergy   . Arthritis   . Bile duct leak 09/2017   post op  . Carotid artery disease ([place])   . Cataract   . Chronic kidney disease    CKD, right renal artery stenosis  . Coronary artery disease    mild left internal carotid artery  stenosis  . Glaucoma   . Gout   . Heart murmur   . Hyperlipidemia   . Hypertension   . Tubular adenoma of colon 2003   Dr. Watt Climes  . Vitamin D deficiency     Social History   Tobacco Use  . Smoking status: Never Smoker  . Smokeless tobacco: Never Used  Vaping Use  . Vaping Use: Never used  Substance Use Topics  . Alcohol use: No  . Drug use: No    Family History  Problem Relation Age of Onset  . Other Brother  laryngeal cancer  . Heart disease Brother   . Heart disease Mother   . Heart disease Father   . Stroke Father   . Heart disease Sister   . Diabetes Brother   . Colon cancer Neg Hx   . Esophageal cancer Neg Hx   . Stomach cancer Neg Hx   . Rectal cancer Neg Hx    Allergies  Allergen Reactions  . Toprol Xl [Metoprolol Tartrate] Other (See Comments)    Bradycardia with beta blockers  . Lipitor [Atorvastatin]     Myalgias   . Coumadin [Warfarin Sodium] Rash  . Tape Rash    Clear tape  . Warfarin Rash    OBJECTIVE: Vitals:   02/24/20 1355  BP: 139/60  Pulse: (!) 51  Temp: 98.4 F (36.9 C)  SpO2: 98%  Weight: 149 lb (67.6 kg)  Height: 6\' 2"  (1.88 m)   Body mass index is 19.13 kg/m.   Physical Exam Constitutional:      Comments: He is pleasant and in no distress.  He is accompanied by his wife.  HENT:     Ears:     Comments: He is hard of hearing. Cardiovascular:     Rate and Rhythm: Regular rhythm. Bradycardia present.     Heart sounds: Murmur heard.      Comments: He has a harsh 2/6 systolic murmur. Pulmonary:     Effort: Pulmonary effort is normal.     Breath sounds: Normal breath sounds.  Abdominal:     Palpations: Abdomen is soft.     Tenderness: There is no abdominal tenderness.  Psychiatric:        Mood and Affect: Mood normal.     Microbiology: No results found for this or any previous visit (from the past 240 hour(s)).  Michel Bickers, MD [facility] [place] [phone] pager   [phone] cell 02/24/2020, 3:15 PM

## 2020-02-26 ENCOUNTER — Encounter: Payer: Self-pay | Admitting: Medical

## 2020-03-01 DIAGNOSIS — H04123 Dry eye syndrome of bilateral lacrimal glands: Secondary | ICD-10-CM | POA: Diagnosis not present

## 2020-03-01 DIAGNOSIS — Z961 Presence of intraocular lens: Secondary | ICD-10-CM | POA: Diagnosis not present

## 2020-03-01 DIAGNOSIS — H401131 Primary open-angle glaucoma, bilateral, mild stage: Secondary | ICD-10-CM | POA: Diagnosis not present

## 2020-03-01 DIAGNOSIS — H353231 Exudative age-related macular degeneration, bilateral, with active choroidal neovascularization: Secondary | ICD-10-CM | POA: Diagnosis not present

## 2020-03-05 ENCOUNTER — Telehealth: Payer: Self-pay | Admitting: Medical

## 2020-03-05 NOTE — Telephone Encounter (Signed)
P.A. Cassell Clement

## 2020-03-14 ENCOUNTER — Other Ambulatory Visit: Payer: Self-pay

## 2020-03-14 ENCOUNTER — Encounter: Payer: Medicare Other | Admitting: Thoracic Surgery (Cardiothoracic Vascular Surgery)

## 2020-03-14 ENCOUNTER — Encounter (INDEPENDENT_AMBULATORY_CARE_PROVIDER_SITE_OTHER): Payer: Medicare Other | Admitting: Ophthalmology

## 2020-03-14 DIAGNOSIS — H35033 Hypertensive retinopathy, bilateral: Secondary | ICD-10-CM

## 2020-03-14 DIAGNOSIS — H34811 Central retinal vein occlusion, right eye, with macular edema: Secondary | ICD-10-CM

## 2020-03-14 DIAGNOSIS — I10 Essential (primary) hypertension: Secondary | ICD-10-CM | POA: Diagnosis not present

## 2020-03-14 DIAGNOSIS — H348122 Central retinal vein occlusion, left eye, stable: Secondary | ICD-10-CM

## 2020-03-14 DIAGNOSIS — H353122 Nonexudative age-related macular degeneration, left eye, intermediate dry stage: Secondary | ICD-10-CM

## 2020-03-14 DIAGNOSIS — H43813 Vitreous degeneration, bilateral: Secondary | ICD-10-CM

## 2020-03-14 DIAGNOSIS — H35371 Puckering of macula, right eye: Secondary | ICD-10-CM

## 2020-03-15 ENCOUNTER — Encounter: Payer: Self-pay | Admitting: Thoracic Surgery (Cardiothoracic Vascular Surgery)

## 2020-03-15 ENCOUNTER — Institutional Professional Consult (permissible substitution): Payer: Medicare Other | Admitting: Thoracic Surgery (Cardiothoracic Vascular Surgery)

## 2020-03-15 VITALS — BP 139/48 | HR 45 | Temp 97.8°F | Resp 20 | Ht 74.0 in | Wt 149.0 lb

## 2020-03-15 DIAGNOSIS — I25118 Atherosclerotic heart disease of native coronary artery with other forms of angina pectoris: Secondary | ICD-10-CM | POA: Diagnosis not present

## 2020-03-15 DIAGNOSIS — I350 Nonrheumatic aortic (valve) stenosis: Secondary | ICD-10-CM | POA: Diagnosis not present

## 2020-03-15 NOTE — Progress Notes (Signed)
[facility]  MULTIDISCIPLINARY HEART VALVE CLINIC  CARDIOTHORACIC SURGERY CONSULTATION REPORT  Referring Provider is [name], MD PCP is Tysinger, Camelia Eng, PA-C  Chief Complaint  Patient presents with  . Aortic Stenosis    Surgical consult for TAVR, review all testing    HPI:  Patient is an 79 year old male with history of severe aortic stenosis, coronary artery disease status post PCI and stenting of the LAD and diagonal in the remote past, hypertension, stage IV chronic kidney disease, right renal artery stenosis, pericardial effusion, hyperlipidemia, bilateral pleural effusions, weight loss, and chronic persistent C. difficile colitis who has been referred for surgical consultation to discuss treatment options for management of severe symptomatic aortic stenosis.  Patient's cardiac history dates back to 2006 when he first presented with symptomatic coronary artery disease.  He underwent complex stenting of the left anterior descending coronary artery and diagonal branches in 2006, 2007, and again in 2008.  For the last several years he has been followed by Dr. [name].  In 2019 he underwent cholecystectomy complicated by bile leak and had a complex slow postoperative recovery requiring numerous interventions.  Shortly after that he began to experience exertional chest pain.  He was seen in follow-up by Dr. [name] in May of this year and underwent nuclear stress test that was felt to be low risk with resting ejection fraction reported 55%, no ST segment elevation during stress, medium defect of moderate severity in the mid inferior wall apical inferior and apex felt likely consistent with apical thinning versus small previous infarct.  Transthoracic echocardiogram revealed normal left ventricular systolic function with severe aortic stenosis.  Peak velocity across aortic valve measured 4.2 m/s corresponding to mean transvalvular gradient estimated 41 mmHg and aortic valve  area calculated 0.95 cm.  There was also a moderate sized pericardial effusion without evidence of tamponade.  Diagnostic cardiac catheterization was performed and revealed widely patent stents in the proximal and mid left anterior descending coronary artery and the second diagonal branch.  There was 40% ostial stenosis of left main coronary artery with 60% stenosis of the distal left anterior descending coronary artery.  Peak to peak gradient across the aortic valve measured on pullback was reported 14 mmHg.  Right heart pressures were normal.  Cardiac output was quite high and reported 11.7 L/min with calculated aortic valve area 2.85 cm.  The patient was referred to the multidisciplinary heart valve clinic and was evaluated by Dr. Burt Knack on December 24, 2019.  The possibility of transcatheter aortic valve replacement was discussed but concerns were raised regarding the patient's progressive chronic kidney disease and nephrology consultation was recommended prior to proceeding with CT angiography.  Shortly after that the patient was hospitalized with chest pain and shortness of breath.  Troponin levels were elevated.  Chest x-ray revealed bilateral pleural effusions.  Creatinine was elevated 3.15.  Patient was seen in consultation by Dr. Marval Regal from the nephrology team and placement of primary AV fistula was recommended.  Cardiac gated CT angiogram of the heart and CT angiogram of the aorta and iliac vessels was performed during that hospitalization.  CTA revealed findings consistent with severe aortic stenosis potentially treatable with transcatheter aortic valve replacement without any significant complicating features.  CT angiogram of the chest, abdomen and pelvis revealed atherosclerotic disease with fusiform aneurysmal dilatation of the infrarenal abdominal aortic aneurysm but what appeared to be adequate pelvic vascular access for transfemoral approach.  The patient also had moderate bilateral pleural  effusions,  pericardial effusion, and morphologic changes in the liver suggestive of possible cirrhosis.  During that hospitalization the patient was notably anemic with hemoglobin measured less than 8 as well as complaints of chronically poor appetite and significant weight loss.  During his hospitalization he had an episode of atrial fibrillation with rapid ventricular response which spontaneously converted to sinus rhythm.  He was started on Eliquis at reduced dose at that time.  Plavix was continued while aspirin was stopped.  The patient underwent follow-up transthoracic echocardiogram after hospital discharge which revealed stable appearance of moderate sized pericardial effusion without evidence of tamponade.  Shortly after hospital discharge the patient developed C. difficile colitis.  He was initially treated with oral Flagyl and then transition to oral vancomycin because of continued diarrhea.  This persisted ultimately prompting referral to the infectious disease clinic where the patient has been followed by Dr. Megan Salon, most recently on February 24, 2020.  Patient was referred for elective surgical consultation to discuss treatment options for management of severe symptomatic aortic stenosis.  Patient is married and lives locally in [place] with his wife.  He has remained reasonably active physically and functionally independent until the past 2 years.  The patient states that ever since his cholecystectomy in 2019 he has gone progressively downhill.  He has lost more than 80 pounds in weight and developed progressive weakness.  He has history of chronic chest pain that sometimes occurs with exertion and sometimes with rest.  This has been worse recently and over the past 2 weeks he has taken sublingual nitroglycerin on 2 occasions with some relief.  He is severely weak and deconditioned.  He complains that he is miserable from his chronic diarrhea.  He states that for the past few months he has  had to go to the bathroom nearly every 30 minutes round-the-clock.  Recently stools have become somewhat more formed but remain watery intermittently.  Despite the fact that stools are typically small he still has urgency round-the-clock and frequently has incontinence of stool because of it.  He has a very poor appetite and continues to lose weight.  He gets short of breath and tired quite easily with minimal exertion.  He has not had resting shortness of breath or orthopnea.  He has not had palpitations, dizzy spells, nor syncope.  He states that he feels miserable and has discussed with his family on multiple occasions that he would not wish to be resuscitated and he would not wish to be placed on dialysis.  Past Medical History:  Diagnosis Date  . AAA (abdominal aortic aneurysm) (HCC)    3.6 cm (01/09/12 ultrasound)  . Allergy   . Arthritis   . Bile duct leak 09/2017   post op  . Carotid artery disease ([place])   . Cataract   . Chronic kidney disease    CKD, right renal artery stenosis  . Coronary artery disease    mild left internal carotid artery stenosis  . Glaucoma   . Gout   . Heart murmur   . Hyperlipidemia   . Hypertension   . Tubular adenoma of colon 2003   Dr. Watt Climes  . Vitamin D deficiency     Past Surgical History:  Procedure Laterality Date  . ABDOMINAL AORTOGRAM N/A 12/17/2019   Procedure: ABDOMINAL AORTOGRAM;  Surgeon: [name], MD;  Location: [facility];  Service: Cardiovascular;  Laterality: N/A;  . AV FISTULA PLACEMENT Left 12/31/2019   Procedure: LEFT ARM BRACHIOCEPHALIC ARTERIOVENOUS (AV)  FISTULA CREATION;  Surgeon: Serafina Mitchell, MD;  Location: [facility] OR;  Service: Vascular;  Laterality: Left;  . BACK SURGERY    . CARDIAC CATHETERIZATION     stents  last -07  . CARDIAC STENTS    . CERVICAL DISC SURGERY    . CHOLECYSTECTOMY N/A 09/26/2017   Procedure: LAPAROSCOPIC CHOLECYSTECTOMY;  Surgeon: Rolm Bookbinder, MD;  Location: [facility];  Service: General;   Laterality: N/A;  . ERCP N/A 10/09/2017   Procedure: ENDOSCOPIC RETROGRADE CHOLANGIOPANCREATOGRAPHY (ERCP);  Surgeon: Milus Banister, MD;  Location: [facility];  Service: Endoscopy;  Laterality: N/A;  . EYE SURGERY Left 06   retinal detachment  . HERNIA REPAIR Right 93  . IR RADIOLOGIST EVAL & MGMT  10/29/2017  . LUMBAR LAMINECTOMY  06/06/2012   Procedure: MICRODISCECTOMY LUMBAR LAMINECTOMY;  Surgeon: Marybelle Killings, MD;  Location: [facility];  Service: Orthopedics;  Laterality: Left;  Left L4-5 Microdiscectomy  . RIGHT/LEFT HEART CATH AND CORONARY ANGIOGRAPHY N/A 12/17/2019   Procedure: RIGHT/LEFT HEART CATH AND CORONARY ANGIOGRAPHY;  Surgeon: [name], MD;  Location: [facility];  Service: Cardiovascular;  Laterality: N/A;  . SHOULDER ARTHROSCOPY Right   . TOTAL HIP ARTHROPLASTY Left     Family History  Problem Relation Age of Onset  . Other Brother        laryngeal cancer  . Heart disease Brother   . Heart disease Mother   . Heart disease Father   . Stroke Father   . Heart disease Sister   . Diabetes Brother   . Colon cancer Neg Hx   . Esophageal cancer Neg Hx   . Stomach cancer Neg Hx   . Rectal cancer Neg Hx     Social History   Socioeconomic History  . Marital status: Married    Spouse name: Not on file  . Number of children: Not on file  . Years of education: Not on file  . Highest education level: Not on file  Occupational History  . Not on file  Tobacco Use  . Smoking status: Never Smoker  . Smokeless tobacco: Never Used  Vaping Use  . Vaping Use: Never used  Substance and Sexual Activity  . Alcohol use: No  . Drug use: No  . Sexual activity: Not on file  Other Topics Concern  . Not on file  Social History Narrative  . Not on file   Social Determinants of Health   Financial Resource Strain:   . Difficulty of Paying Living Expenses: Not on file  Food Insecurity:   . Worried About Charity fundraiser in the Last Year: Not on file  . Ran  Out of Food in the Last Year: Not on file  Transportation Needs:   . Lack of Transportation (Medical): Not on file  . Lack of Transportation (Non-Medical): Not on file  Physical Activity:   . Days of Exercise per Week: Not on file  . Minutes of Exercise per Session: Not on file  Stress:   . Feeling of Stress : Not on file  Social Connections:   . Frequency of Communication with Friends and Family: Not on file  . Frequency of Social Gatherings with Friends and Family: Not on file  . Attends Religious Services: Not on file  . Active Member of Clubs or Organizations: Not on file  . Attends Archivist Meetings: Not on file  . Marital Status: Not on file  Intimate Partner Violence:   . Fear  of Current or Ex-Partner: Not on file  . Emotionally Abused: Not on file  . Physically Abused: Not on file  . Sexually Abused: Not on file    Current Outpatient Medications  Medication Sig Dispense Refill  . allopurinol (ZYLOPRIM) 300 MG tablet Take 1 tablet (300 mg total) by mouth daily. 90 tablet 1  . amLODipine (NORVASC) 10 MG tablet Take 1 tablet (10 mg total) by mouth daily. 90 tablet 3  . apixaban (ELIQUIS) 2.5 MG TABS tablet Take 1 tablet (2.5 mg total) by mouth 2 (two) times daily. 180 tablet 3  . brimonidine (ALPHAGAN) 0.15 % ophthalmic solution Place 1 drop into both eyes 2 (two) times daily.    . Cholecalciferol (VITAMIN D3) 5000 UNITS TABS Take 5,000 Units by mouth daily.    . clopidogrel (PLAVIX) 75 MG tablet Take 1 tablet (75 mg total) by mouth daily. 90 tablet 1  . dorzolamide (TRUSOPT) 2 % ophthalmic solution Place 1 drop into the right eye 2 (two) times daily.     . fenofibrate micronized (LOFIBRA) 134 MG capsule Take 1 capsule (134 mg total) by mouth daily. 90 capsule 1  . ferrous sulfate 325 (65 FE) MG tablet Take 325 mg by mouth daily with breakfast.    . furosemide (LASIX) 80 MG tablet Take 1 tablet (80 mg total) by mouth 2 (two) times daily. 60 tablet 3  . hydrALAZINE  (APRESOLINE) 25 MG tablet Take 1 tablet (25 mg total) by mouth 3 (three) times daily. 270 tablet 3  . isosorbide mononitrate (IMDUR) 30 MG 24 hr tablet Take 1 tablet (30 mg total) by mouth 2 (two) times daily. 60 tablet 2  . ketorolac (ACULAR) 0.5 % ophthalmic solution Place 1 drop into the right eye daily as needed (itching eye).     . Lactobacillus-Inulin ([place]) CAPS Take 1 capsule by mouth 3 (three) times daily. 90 capsule 0  . metoprolol tartrate (LOPRESSOR) 25 MG tablet Take 0.5 tablets (12.5 mg total) by mouth 2 (two) times daily. 90 tablet 3  . nepafenac (ILEVRO) 0.3 % ophthalmic suspension Place 1 drop into the right eye at bedtime.     . pravastatin (PRAVACHOL) 40 MG tablet TAKE 1 TABLET BY MOUTH AT BEDTIME FOR CHOLESTEROL (Patient taking differently: Take 40 mg by mouth daily. ) 90 tablet 1  . triamcinolone cream (KENALOG) 0.1 % Apply 1 application topically daily as needed (rash on [place]).     . cyproheptadine (PERIACTIN) 4 MG tablet Take 1 tablet (4 mg total) by mouth 2 (two) times daily. (Patient not taking: Reported on 03/15/2020) 60 tablet 0  . fidaxomicin (DIFICID) 200 MG TABS tablet Take 1 tablet (200 mg total) by mouth 2 (two) times daily. (Patient not taking: Reported on 03/15/2020) 20 tablet 0   No current facility-administered medications for this visit.    Allergies  Allergen Reactions  . Toprol Xl [Metoprolol Tartrate] Other (See Comments)    Bradycardia with beta blockers  . Lipitor [Atorvastatin]     Myalgias   . Coumadin [Warfarin Sodium] Rash  . Tape Rash    Clear tape  . Warfarin Rash      Review of Systems:   General:  poor appetite, no energy, no weight gain, chronic weight loss, no fever  Cardiac:  + chest pain with exertion, + chest pain at rest, +SOB with low level exertion, no resting SOB, no PND, no orthopnea, no palpitations, no arrhythmia, no atrial fibrillation, + LE edema, no dizzy spells,  no syncope  Respiratory:  + chronic  shortness of breath, no home oxygen, no productive cough, no dry cough, no bronchitis, no wheezing, no hemoptysis, no asthma, no pain with inspiration or cough, no sleep apnea, no CPAP at night  GI:   no difficulty swallowing, no reflux, no frequent heartburn, no hiatal hernia, + abdominal pain, no constipation, + diarrhea, no hematochezia, no hematemesis, no melena  GU:   no dysuria,  no frequency, no urinary tract infection, no hematuria, no enlarged prostate, no kidney stones, + kidney disease  Vascular:  no pain suggestive of claudication, no pain in feet, no leg cramps, no varicose veins, no DVT, no non-healing foot ulcer  Neuro:   no stroke, no TIA's, no seizures, no headaches, no temporary blindness one eye,  no slurred speech, no peripheral neuropathy, no chronic pain, mild instability of gait, no memory/cognitive dysfunction  Musculoskeletal: no arthritis, no joint swelling, no myalgias, no difficulty walking, reduced mobility   Skin:   + rash, + itching, no skin infections, no pressure sores or ulcerations  Psych:   no anxiety, no depression, no nervousness, no unusual recent stress  Eyes:   + blurry vision - legally blind, no floaters, no recent vision changes, does not wear glasses or contacts  ENT:   + severe hearing loss, no loose or painful teeth, no dentures, last saw dentist July 2021  Hematologic:  + easy bruising, no abnormal bleeding, no clotting disorder, no frequent epistaxis  Endocrine:  no diabetes, does not check CBG's at home           Physical Exam:   BP (!) 139/48   Pulse (!) 45   Temp 97.8 F (36.6 C) (Skin)   Resp 20   Ht 6\' 2"  (1.88 m)   Wt 149 lb (67.6 kg)   SpO2 97% Comment: RA  BMI 19.13 kg/m   General:  Thin, deconditioned, elderly male in NAD   HEENT:  Unremarkable   Neck:   no JVD, no bruits, no adenopathy   Chest:   diminished breath sounds both lung bases, no wheezes, no rhonchi   CV:   Distant heart sounds, RRR, grade III/VI  crescendo/decrescendo murmur heard best at RSB,  no diastolic murmur  Abdomen:  soft, non-tender, no masses   Extremities:  warm, well-perfused, pulses not palpable, no LE edema  Rectal/GU  Deferred  Neuro:   Grossly non-focal and symmetrical throughout  Skin:   Clean and dry, no rashes, no breakdown   Diagnostic Tests:   ECHOCARDIOGRAM REPORT       Patient Name:  Terry Garner Date of Exam: 12/09/2019  Medical Rec #: [id number]    Height:    74.0 in  Accession #:  [id number]   Weight:    166.0 lb  Date of Birth: 10/23/1940    BSA:     2.008 m  Patient Age:  14 years    BP:      140/88 mmHg  Patient Gender: M        HR:      60 bpm.  Exam Location: [address]   Procedure: 2D Echo, 3D Echo, Cardiac Doppler, Color Doppler and Strain  Analysis   Indications:  R07.9 Chest pain    History:    Patient has prior history of Echocardiogram examinations,  most         recent 09/24/2017. Risk Factors:Hypertension and  Dyslipidemia.         Murmur. Coronary artery disease.  Chronic kidney disease.  AAA.    Sonographer:  Wilford Sports Rodgers-Jones RDCS  Referring Phys: Ewa Gentry    1. There is severe aortic stenosis, and at least moderate aortic  regurgitation. Gradients across AoV partially related to regurgitation  (LVOT VTI 27 cm). V max 4.2 m/s, MG 41 mmHG, AVA 0.95 cm2, DI 0.25. The  aortic valve is tricuspid. Aortic valve  regurgitation is moderate. Severe aortic valve stenosis. Aortic valve  area, by VTI measures 0.95 cm. Aortic valve mean gradient measures 41.0  mmHg. Aortic valve Vmax measures 4.23 m/s.  2. Left ventricular ejection fraction, by estimation, is 60 to 65%. The  left ventricle has normal function. The left ventricle has no regional  wall motion abnormalities. There is mild concentric left ventricular  hypertrophy. Left ventricular diastolic  parameters  are consistent with Grade I diastolic dysfunction (impaired  relaxation).  3. Right ventricular systolic function is normal. The right ventricular  size is normal. There is mildly elevated pulmonary artery systolic  pressure. The estimated right ventricular systolic pressure is 97.9 mmHg.  4. Left atrial size was severely dilated.  5. Right atrial size was mildly dilated.  6. Moderate pericardial effusion. The pericardial effusion is  circumferential. There is no evidence of cardiac tamponade.  7. The mitral valve is degenerative. Mild mitral valve regurgitation. No  evidence of mitral stenosis.  8. The inferior vena cava is normal in size with <50% respiratory  variability, suggesting right atrial pressure of 8 mmHg.   Comparison(s): Changes from prior study are noted. There is now a moderate  pericardial effusion without signs of tamponade. Aortic stenosis is now  severe with moderate regurgitation. EF remains normal.   FINDINGS  Left Ventricle: Left ventricular ejection fraction, by estimation, is 60  to 65%. The left ventricle has normal function. The left ventricle has no  regional wall motion abnormalities. The left ventricular internal cavity  size was normal in size. There is  mild concentric left ventricular hypertrophy. Left ventricular diastolic  parameters are consistent with Grade I diastolic dysfunction (impaired  relaxation). Normal left ventricular filling pressure.   Right Ventricle: The right ventricular size is normal. No increase in  right ventricular wall thickness. Right ventricular systolic function is  normal. There is mildly elevated pulmonary artery systolic pressure. The  tricuspid regurgitant velocity is 2.66  m/s, and with an assumed right atrial pressure of 8 mmHg, the estimated  right ventricular systolic pressure is 48.0 mmHg.   Left Atrium: Left atrial size was severely dilated.   Right Atrium: Right atrial size was mildly dilated.    Pericardium: A moderately sized pericardial effusion is present. The  pericardial effusion is circumferential. There is no evidence of cardiac  tamponade.   Mitral Valve: The mitral valve is degenerative in appearance. There is  mild thickening of the anterior and posterior mitral valve leaflet(s).  Mild mitral annular calcification. Mild mitral valve regurgitation. No  evidence of mitral valve stenosis.   Tricuspid Valve: The tricuspid valve is grossly normal. Tricuspid valve  regurgitation is mild . No evidence of tricuspid stenosis.   Aortic Valve: There is severe aortic stenosis, and at least moderate  aortic regurgitation. Gradients across AoV partially related to  regurgitation (LVOT VTI 27 cm). V max 4.2 m/s, MG 41 mmHG, AVA 0.95 cm2,  DI 0.25. The aortic valve is tricuspid. . There  is severe thickening and severe calcifcation of the aortic valve. Aortic  valve regurgitation is moderate.  Aortic regurgitation PHT measures 323  msec. Severe aortic stenosis is present. There is severe thickening of the  aortic valve. There is severe  calcifcation of the aortic valve. Aortic valve mean gradient measures 41.0  mmHg. Aortic valve peak gradient measures 71.6 mmHg. Aortic valve area, by  VTI measures 0.95 cm.   Pulmonic Valve: The pulmonic valve was grossly normal. Pulmonic valve  regurgitation is trivial. No evidence of pulmonic stenosis.   Aorta: The aortic root and ascending aorta are structurally normal, with  no evidence of dilitation.   Venous: The inferior vena cava is normal in size with less than 50%  respiratory variability, suggesting right atrial pressure of 8 mmHg.   IAS/Shunts: The atrial septum is grossly normal.     LEFT VENTRICLE  PLAX 2D  LVIDd:     5.40 cm Diastology  LVIDs:     3.10 cm LV e' lateral:  4.24 cm/s  LV PW:     1.20 cm LV E/e' lateral: 16.2  LV IVS:    1.00 cm LV e' medial:  5.11 cm/s  LVOT diam:   2.20 cm LV  E/e' medial: 13.4  LV SV:     106  LV SV Index:  53    2D Longitudinal Strain  LVOT Area:   3.80 cm 2D Strain GLS (A2C):  -19.6 %             2D Strain GLS (A3C):  -19.3 %             2D Strain GLS (A4C):  -19.3 %             2D Strain GLS Avg:   -19.4 %               3D Volume EF:             3D EF:    63 %             LV EDV:    176 ml             LV ESV:    65 ml             LV SV:    111 ml   RIGHT VENTRICLE       IVC  RV Basal diam: 3.30 cm   IVC diam: 2.00 cm  RV S prime:   12.90 cm/s  TAPSE (M-mode): 3.5 cm   LEFT ATRIUM       Index    RIGHT ATRIUM      Index  LA diam:    5.50 cm 2.74 cm/m RA Area:   13.60 cm  LA Vol (A2C):  134.0 ml 66.73 ml/m RA Volume:  30.40 ml 15.14 ml/m  LA Vol (A4C):  91.1 ml 45.37 ml/m  LA Biplane Vol: 110.0 ml 54.78 ml/m  AORTIC VALVE  AV Area (Vmax):  0.91 cm  AV Area (Vmean):  1.03 cm  AV Area (VTI):   0.95 cm  AV Vmax:      423.00 cm/s  AV Vmean:     273.400 cm/s  AV VTI:      1.110 m  AV Peak Grad:   71.6 mmHg  AV Mean Grad:   41.0 mmHg  LVOT Vmax:     100.75 cm/s  LVOT Vmean:    74.150 cm/s  LVOT VTI:     0.278 m  LVOT/AV VTI ratio: 0.25  AI PHT:      323  msec    AORTA  Ao Root diam: 3.40 cm  Ao Asc diam: 3.20 cm   MITRAL VALVE        TRICUSPID VALVE  MV Area (PHT): 2.95 cm   TR Peak grad:  28.3 mmHg  MV Decel Time: 257 msec   TR Vmax:    266.00 cm/s  MV E velocity: 68.60 cm/s  MV A velocity: 127.00 cm/s SHUNTS  MV E/A ratio: 0.54     Systemic VTI: 0.28 m               Systemic Diam: 2.20 cm   Eleonore Chiquito MD  Electronically signed by Eleonore Chiquito MD  Signature Date/Time: 12/09/2019/12:32:31 PM        ECHOCARDIOGRAM REPORT       Patient Name:   Terry Garner Date of Exam: 01/12/2020  Medical Rec #: [id number]    Height:    74.0 in  Accession #:  [id number]   Weight:    173.4 lb  Date of Birth: 1940-08-08    BSA:     2.046 m  Patient Age:  33 years    BP:      148/50 mmHg  Patient Gender: M        HR:      52 bpm.  Exam Location: [place]   Procedure: 2D Echo, Cardiac Doppler and Color Doppler   Indications:  I31.3 Pericardial effusion         I35.0 Aortic Stenosis    History:    Patient has prior history of Echocardiogram examinations,  most         recent 12/09/2019. Risk Factors:Hypertension and  Dyslipidemia.         Murmur. Coronary artery disease. Chronic kidney disease.  AAA.    Sonographer:  Wilford Sports Rodgers-Jones RDCS  Referring Phys: [place]    1. Left ventricular ejection fraction, by estimation, is 60 to 65%. The  left ventricle has normal function. The left ventricle has no regional  wall motion abnormalities. There is moderate left ventricular hypertrophy.  Left ventricular diastolic  parameters are consistent with Grade II diastolic dysfunction  (pseudonormalization). Elevated left atrial pressure.  2. Right ventricular systolic function is normal. The right ventricular  size is normal. There is normal pulmonary artery systolic pressure.  3. Left atrial size was severely dilated.  4. The mitral valve is normal in structure. Mild mitral valve  regurgitation.  5. The inferior vena cava is dilated in size with <50% respiratory  variability, suggesting right atrial pressure of 15 mmHg.  6. The aortic valve is abnormal. Aortic valve regurgitation is moderate.  Severe aortic valve stenosis. Vmax 4.0 m/s, MG 36 mmHg, AVA 0.8 cm^2, DI  0.25.  7. Moderate pericardial effusion measuring up to 1.2 cm adjacent to LV  lateral wall. Stable from prior echo on 12/09/19. IVC is fixed/dilated and    there is significant mitral inflow respiratory variation, but no RV  collapse to suggest tamponade.   FINDINGS  Left Ventricle: Left ventricular ejection fraction, by estimation, is 60  to 65%. The left ventricle has normal function. The left ventricle has no  regional wall motion abnormalities. The left ventricular internal cavity  size was normal in size. There is  moderate left ventricular hypertrophy. Left ventricular diastolic  parameters are consistent with Grade II diastolic dysfunction  (pseudonormalization). Elevated left atrial pressure.   Right Ventricle: The right ventricular size is normal. Right vetricular  wall  thickness was not assessed. Right ventricular systolic function is  normal. There is normal pulmonary artery systolic pressure. The tricuspid  regurgitant velocity is 1.89 m/s,  and with an assumed right atrial pressure of 15 mmHg, the estimated right  ventricular systolic pressure is 79.8 mmHg.   Left Atrium: Left atrial size was severely dilated.   Right Atrium: Right atrial size was normal in size.   Pericardium: Moderate pericardial effusion mesuring up to 1.5cm adjacent  to LV inferior wall. A moderately sized pericardial effusion is present.   Mitral Valve: The mitral valve is normal in structure. Mild mitral valve  regurgitation.   Tricuspid Valve: The tricuspid valve is normal in structure. Tricuspid  valve regurgitation is mild.   Aortic Valve: The aortic valve is abnormal. Aortic valve regurgitation is  moderate. Severe aortic stenosis is present. There is severe calcifcation  of the aortic valve. Aortic valve mean gradient measures 32.6 mmHg. Aortic  valve peak gradient measures  57.2 mmHg. Aortic valve area, by VTI measures 0.80 cm.   Pulmonic Valve: The pulmonic valve was grossly normal. Pulmonic valve  regurgitation is not visualized.   Aorta: The aortic root and ascending aorta are structurally normal, with  no evidence of  dilitation.   Venous: The inferior vena cava is dilated in size with less than 50%  respiratory variability, suggesting right atrial pressure of 15 mmHg.   IAS/Shunts: No atrial level shunt detected by color flow Doppler.   Additional Comments: There is a small pleural effusion in the left lateral  region.     LEFT VENTRICLE  PLAX 2D  LVIDd:     5.30 cm Diastology  LVIDs:     3.20 cm LV e' lateral:  3.70 cm/s  LV PW:     1.50 cm LV E/e' lateral: 31.4  LV IVS:    1.00 cm LV e' medial:  4.24 cm/s  LVOT diam:   2.00 cm LV E/e' medial: 27.4  LV SV:     82  LV SV Index:  40  LVOT Area:   3.14 cm     RIGHT VENTRICLE       IVC  RV Basal diam: 4.20 cm   IVC diam: 2.20 cm  RV S prime:   12.90 cm/s  TAPSE (M-mode): 2.3 cm   LEFT ATRIUM       Index    RIGHT ATRIUM      Index  LA diam:    5.60 cm 2.74 cm/m RA Area:   16.00 cm  LA Vol (A2C):  126.0 ml 61.60 ml/m RA Volume:  44.00 ml 21.51 ml/m  LA Vol (A4C):  84.0 ml 41.06 ml/m  LA Biplane Vol: 107.0 ml 52.31 ml/m  AORTIC VALVE  AV Area (Vmax):  0.78 cm  AV Area (Vmean):  0.79 cm  AV Area (VTI):   0.80 cm  AV Vmax:      378.20 cm/s  AV Vmean:     267.600 cm/s  AV VTI:      1.033 m  AV Peak Grad:   57.2 mmHg  AV Mean Grad:   32.6 mmHg  LVOT Vmax:     93.50 cm/s  LVOT Vmean:    66.950 cm/s  LVOT VTI:     0.262 m  LVOT/AV VTI ratio: 0.25    AORTA  Ao Root diam: 3.30 cm  Ao Asc diam: 3.40 cm   MITRAL VALVE        TRICUSPID VALVE  MV Area (PHT):  2.56 cm   TR Peak grad:  14.3 mmHg  MV Decel Time: 296 msec   TR Vmax:    189.00 cm/s  MV E velocity: 116.00 cm/s  MV A velocity: 94.30 cm/s  SHUNTS  MV E/A ratio: 1.23     Systemic VTI: 0.26 m               Systemic Diam: 2.00 cm   Oswaldo Milian MD  Electronically signed by Oswaldo Milian MD  Signature  Date/Time: 01/12/2020/9:47:25 PM         ABDOMINAL AORTOGRAM  RIGHT/LEFT HEART CATH AND CORONARY ANGIOGRAPHY  Conclusion    Previously placed Prox LAD to Mid LAD stent (unknown type) is widely patent.  2nd Diag-1 lesion is 80% stenosed.  Previously placed 2nd Diag-2 stent (unknown type) is widely patent.  Hemodynamic findings consistent with aortic valve stenosis.  Ost LM lesion is 40% stenosed.  Dist LAD lesion is 60% stenosed.   GRAYSYN BACHE is a 79 y.o. male    [id number] LOCATION:  FACILITY: [facility]  PHYSICIAN: Quay Burow, M.D. 09-Jul-1940    CARDIAC CATHETERIZATION     History obtained from chart review.Terry Stammer Crideris a 79 y.o.moderately overweight married Caucasian male father of one child formally a patient of Dr. Carlean Jews. [facility]. I last saw5/21/2021. He has a history of CAD status post LAD and diagonal branch stenting back in 2006, 2007 and 2008. He underwent cardiac catheterization by Dr. Nona Dell 06/09/09 revealing widely patent stents after a false positive Myoview. His other problems include chronic branch block, treated hypertension and hyperlipidemia. He denies chest pain or shortness of breath. 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 Since I saw him a year ago he remains completely stable. He has had an issue with his gallbladder and bile leak and has had multiple procedures for this. In addition, his abdominal ultrasound performed 08/28/2017 shows an increase in his abdominal dimensions from 3.5 to 4.5 cm.  Hehas developed increasing effort angina. His blood pressures been difficult to control as well. His Doppler studies of his kidney arteries performed 06/30/2019 revealed progression of his right renal  artery stenosis.I performed Myoview stress test that was nonischemic on 11/26/2019 a 2D echocardiogram on 12/09/2019 that showed severe aortic stenosis with moderate AI and a preserved EF. He also had a moderate pericardial effusion without tamponade physiology.  He presents today for right left heart cath to define his anatomy and physiology.  Distal abdominal aortography-distally abdominal aortogram revealed moderate infrarenal abdominal aortic aneurysm with light widely patent though tortuous iliac arteries.   IMPRESSION: Mr. Williemae Natter LAD and diagonal branch stents are patent.  He does have at least 80% ostial stenosis of D2 which is a small to moderate size vessel although I do not think this is contributing to his symptoms.  The remainder of his coronary anatomy are free of significant disease.  It was difficult crossing his aortic valve with a right Judkins catheter and a straight wire although I finally was able to cross but surprisingly the peak to peak pullback gradient was only 14 mmHg.  I did give the patient hydralazine 10 mg IV for elevated systolic blood pressure.  Is a total of 65 cc of contrast with my max GFR total of being 57 cc.  I did perform abdominal aortography  which did reveal a moderate size infrarenal abdominal uric aneurysm and widely patent iliac arteries.  Is a total of 65 cc of contrast, only slightly above his GFR max limit.  Given his severe elevated hypertension, age and aortic stenosis I am electing to keep him overnight for observation and hydration.  The sheath was removed and pressure held.  The patient left lab in stable condition.  Plan will be for discharge home in the morning with close outpatient follow-up.  Quay Burow. MD, [facility] 12/17/2019 3:16 PM     Recommendations  Antiplatelet/Anticoag Recommend uninterrupted dual antiplatelet therapy with Aspirin 81mg  daily and Clopidogrel 75mg  daily for a minimum of 12 months (ACS-Class I recommendation).   Surgeon Notes    12/31/2019 4:53 PM Op Note signed by Serafina Mitchell, MD  Indications  Critical aortic valve stenosis [I35.0 (ICD-10-CM)]  Coronary artery disease of native artery of native heart with stable angina pectoris ([place]) [Y19.509 (ICD-10-CM)]  Procedural Details  Technical Details PROCEDURE DESCRIPTION:   The patient was brought to the second floor [facility] lab in the postabsorptive state. He was not premedicated . His right wrist and antecubital fossaWere prepped and shaved in usual sterile fashion. Xylocaine 1% was used for local anesthesia. A 6 [place] sheath was inserted into the right radial artery using standard Seldinger technique.  Unfortunately, I could not fully advance the 6 French sheath because of an unclear obstruction and therefore aborted radial approach and placed a TR band.  I then accessed the right common femoral artery and placed a 5 French sheath.  A 5 French balloontipped Swan-Ganz catheter was then floated through the right heart chambers obtaining sequential pressures and blood samples for the determination of Fick cardiac output.  Isovue dye was used for the entirety of the case.  Retrograde aorta, ventricular and pullback pressures were recorded.  Estimated blood loss <50 mL.   During this procedure no sedation was administered.  Medications (Filter: Administrations occurring from 1350 to 1507 on 12/17/19) (important) Continuous medications are totaled by the amount administered until 12/17/19 1507.  Heparin (Porcine) in NaCl 1000-0.9 UT/500ML-% SOLN (mL) Total volume:  500 mL Date/Time  Rate/Dose/Volume Action  12/17/19 1354  500 mL Given    Heparin (Porcine) in NaCl 1000-0.9 UT/500ML-% SOLN (mL) Total volume:  500 mL Date/Time  Rate/Dose/Volume Action  12/17/19 1354  500 mL Given    lidocaine (PF) (XYLOCAINE) 1 % injection (mL) Total volume:  24 mL Date/Time  Rate/Dose/Volume Action  12/17/19 1424  2 mL Given  1431  2 mL  Given  1439  20 mL Given    hydrALAZINE (APRESOLINE) injection (mg) Total dose:  20 mg Date/Time  Rate/Dose/Volume Action  12/17/19 1448  10 mg Given  1458  10 mg Given    iohexol (OMNIPAQUE) 350 MG/ML injection (mL) Total volume:  65 mL Date/Time  Rate/Dose/Volume Action  12/17/19 1502  65 mL Given    Contrast  Medication Name Total Dose  iohexol (OMNIPAQUE) 350 MG/ML injection 65 mL    Radiation/Fluoro  Fluoro time: 7.6 (min) DAP: 16875 (mGycm2) Cumulative Air Kerma: 261 (mGy)  Coronary Findings  Diagnostic Dominance: Left Left Main  Ost LM lesion is 40% stenosed.  Left Anterior Descending  Previously placed Prox LAD to Mid LAD stent (unknown type) is widely patent.  Dist LAD lesion is 60% stenosed.  Second Diagonal Branch  2nd Diag-1 lesion is 80% stenosed.  Previously placed 2nd Diag-2 stent (unknown type) is widely patent.  Intervention  No interventions have been documented. Right Heart  Right Heart Pressures Hemodynamic findings consistent with aortic valve stenosis. Right atrial pressure-7/12 Right ventricular pressure-28/0 Pulmonary artery pressure-30/7, mean 17 Pulmonary wedge pressure-A-wave 13, V wave 20, mean 11 LVEDP-16 Aortic valve gradient (peak to peak) 14 mmHg Cardiac output-11.72 L/min with index of 5.85 L/min/m Aortic valve area 2.85 cm  Coronary Diagrams  Diagnostic Dominance: Left  Intervention  Implants   No implant documentation for this case.  Syngo Images  Show images for CARDIAC CATHETERIZATION Images on Long Term Storage  Show images for Justun, Anaya to Procedure Log  Procedure Log    Hemo Data   Most Recent Value  Fick Cardiac Output 11.72 L/min  Fick Cardiac Output Index 5.85 (L/min)/BSA  Aortic Mean Gradient 18.85 mmHg  Aortic Peak Gradient 14 mmHg  Aortic Valve Area 2.85  Aortic Value Area Index 1.42 cm2/BSA  RA A Wave 7 mmHg  RA V Wave 2 mmHg  RA Mean 1 mmHg  RV Systolic Pressure 28 mmHg  RV  Diastolic Pressure -1 mmHg  RV EDP 2 mmHg  PA Systolic Pressure 30 mmHg  PA Diastolic Pressure 7 mmHg  PA Mean 17 mmHg  PW A Wave 13 mmHg  PW V Wave 20 mmHg  PW Mean 11 mmHg  AO Systolic Pressure 824 mmHg  AO Diastolic Pressure 56 mmHg  AO Mean 235 mmHg  LV Systolic Pressure 361 mmHg  LV Diastolic Pressure 10 mmHg  LV EDP 16 mmHg  AOp Systolic Pressure 443 mmHg  AOp Diastolic Pressure 61 mmHg  AOp Mean Pressure 154 mmHg  LVp Systolic Pressure 008 mmHg  LVp Diastolic Pressure 14 mmHg  LVp EDP Pressure 25 mmHg  QP/QS 1  TPVR Index 2.91 HRUI  TSVR Index 18.13 HRUI  PVR SVR Ratio 0.06  TPVR/TSVR Ratio 0.16     Cardiac TAVR CT  TECHNIQUE: The patient was scanned on a [employer]. A 120 kV retrospective scan was triggered in the descending thoracic aorta at 111 HU's. Gantry rotation speed was 250 msecs and collimation was .6 mm. No beta blockade or nitro were given. The 3D data set was reconstructed in 5% intervals of the R-R cycle. Systolic and diastolic phases were analyzed on a dedicated work station using MPR, MIP and VRT modes. The patient received 80 cc of contrast.  FINDINGS: Aortic Valve: Trileaflet aortic valve with moderately calcified leaflets and moderately restricted leaflet opening. Only minimal calcifications are extending into the LVOT. Aortic valve calcium score is 1745.  Aorta: Normal caliber with moderate diffuse atherosclerotic plaque and calcifications. No dissection.  Sinotubular Junction: 30 x 28 mm  Ascending Thoracic Aorta: 33 x 32 mm  Aortic Arch: 28 x 26 mm  Descending Thoracic Aorta: 27 x 25 mm  Sinus of Valsalva Measurements:  Non-coronary: 33 mm  Right -coronary: 31 mm  Left -coronary: 34 mm  Coronary Artery Height above Annulus:  Left Main: 14 mm  Right Coronary: 15 mm  Virtual Basal Annulus Measurements:  Maximum/Minimum Diameter: 26.3 x 22.6 mm  Mean Diameter: 24.2 mm  Perimeter: 77.7  mm  Area: 460 mm2  Optimum Fluoroscopic Angle for Delivery:  LAO 6 CAU 6  IMPRESSION: 1. Trileaflet aortic valve with moderately calcified leaflets and moderately restricted leaflet opening. Only minimal calcifications are extending into the LVOT. Aortic valve calcium score is 1745. Annular measurements are suitable for delivery of a 26 mm Edwards-SAPIEN 3 Ultra valve.  2. Sufficient coronary to annulus distance.  3. Optimum Fluoroscopic Angle for Delivery: LAO 6 CAU 6.  4. No thrombus in the left atrial appendage.  5. Dilated pulmonary artery measuring 35 mm.  Electronically Signed: By: Ena Dawley On: 12/30/2019 20:24    CT ANGIOGRAPHY CHEST, ABDOMEN AND PELVIS  TECHNIQUE: Non-contrast CT of the chest was initially obtained.  Multidetector CT imaging through the chest, abdomen and pelvis was performed using the standard protocol during bolus administration of intravenous contrast. Multiplanar reconstructed images and MIPs were obtained and reviewed to evaluate the vascular anatomy.  CONTRAST:  112mL OMNIPAQUE IOHEXOL 350 MG/ML SOLN  COMPARISON:  CT the abdomen and pelvis 10/29/2017. Chest CTA 06/07/2009.  FINDINGS: CTA CHEST FINDINGS  Cardiovascular: Heart size is mildly enlarged. Small amount of pericardial fluid and/or thickening. No pericardial calcifications. There is aortic atherosclerosis, as well as atherosclerosis of the great vessels of the mediastinum and the coronary arteries, including calcified atherosclerotic plaque in the left main, left anterior descending, left circumflex and right coronary arteries. Severe thickening calcification of the aortic valve. Calcifications of the mitral annulus.  Mediastinum/Lymph Nodes: No pathologically enlarged mediastinal or hilar lymph nodes. Esophagus is unremarkable in appearance. No axillary lymphadenopathy.  Lungs/Pleura: Moderate bilateral pleural effusions. Left pleural effusion  is completely simple lying dependently. Right pleural effusion is predominantly dependently located, although there is a small loculated component in the periphery of the anterolateral right hemithorax (axial image 69 of series 4). These are associated with areas of passive subsegmental atelectasis in the lower lobes of the lungs bilaterally. No acute consolidative airspace disease. No suspicious appearing pulmonary nodules or masses are noted.  Musculoskeletal/Soft Tissues: There are no aggressive appearing lytic or blastic lesions noted in the visualized portions of the skeleton.  CTA ABDOMEN AND PELVIS FINDINGS  Hepatobiliary: Liver has a slightly shrunken appearance and nodular contour, suggesting underlying cirrhosis. Two somewhat ill-defined areas of hypervascularity are noted in the central aspect of the liver, one adjacent to the gallbladder fossa (axial image 112 of series 4) measuring 1.6 x 0.9 cm, and the other in segment 4A of the liver (axial image 97 of series 4) measuring 1.4 x 1.2 cm. No intra or extrahepatic biliary ductal dilatation. Status post cholecystectomy.  Pancreas: No pancreatic mass. No pancreatic ductal dilatation. No well-defined pancreatic or peripancreatic fluid collections to suggest pseudocyst.  Spleen: Unremarkable.  Adrenals/Urinary Tract: Bilateral kidneys and adrenal glands are normal in appearance. No hydroureteronephrosis. Urinary bladder is partially obscured by beam hardening artifact from the patient's left hip arthroplasty, but visualized portions are unremarkable.  Stomach/Bowel: Fluid adjacent to the greater curvature of the proximal stomach, presumably some fluid tracking in the gastrosplenic ligament. Otherwise, stomach is unremarkable in appearance. No pathologic dilatation of small bowel or colon. Several small bowel diverticulae are noted in the duodenum and proximal jejunum, measuring up to 2.5 x 2.1 cm in the  proximal jejunum (axial image 125 of series 4), without surrounding inflammatory changes to suggest an associated diverticulitis at this time. 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 Normal appendix.  Vascular/Lymphatic: Aortic atherosclerosis with vascular findings and measurements pertinent to potential TAVR procedure, as detailed below. Fusiform aneurysmal dilatation of the infrarenal abdominal aorta measuring up to 4.7 x 4.2 cm. No lymphadenopathy noted in the abdomen or pelvis.  Reproductive: Prostate gland and seminal vesicles are unremarkable in appearance.  Other: Small volume of ascites.  No pneumoperitoneum.  Musculoskeletal: Status post PLIF from L3-L5 with interbody grafts at L3-L4  and L4-L5. Status post left hip arthroplasty. 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 There are no aggressive appearing lytic or blastic lesions noted in the visualized portions of the skeleton.  VASCULAR MEASUREMENTS PERTINENT TO TAVR:  AORTA:  Minimal Aortic Diameter-20 x 17 mm  Severity of Aortic Calcification-moderate to severe  RIGHT PELVIS:  Right Common Iliac Artery -  Minimal Diameter-12.0 x 10.2 mm  Tortuosity-mild  Calcification-moderate  Right External Iliac Artery -  Minimal Diameter-7.0 x 7.4 mm  Tortuosity-moderate  Calcification-mild  Right Common Femoral Artery -  Minimal Diameter-7.8 x 6.6 mm  Tortuosity-mild  Calcification-moderate  LEFT PELVIS:  Left Common Iliac Artery -  Minimal Diameter-10.8 x 11.3 mm  Tortuosity-mild  Calcification-mild  Left External Iliac Artery -  Minimal Diameter-7.8 x 6.6 mm  Tortuosity-moderate  Calcification-none  Left Common Femoral Artery -  Minimal Diameter-7.0 x 7.5  mm  Tortuosity-mild  Calcification-moderate  Review of the MIP images confirms the above findings.  IMPRESSION: 1. Vascular findings and measurements pertinent to potential TAVR procedure, as detailed above. 2. Severe thickening calcification of the aortic valve, compatible with reported clinical history of severe aortic stenosis. 3. Aortic atherosclerosis, in addition to left main and 3 vessel coronary artery disease. There is also fusiform aneurysmal dilatation of the infrarenal abdominal aorta which measures up to 4.7 x 4.2 cm in diameter. Recommend followup by abdomen and pelvis CTA in 6 months, and vascular surgery referral/consultation if not already obtained. This recommendation follows ACR consensus guidelines: White Paper of the ACR Incidental Findings Committee II on Vascular Findings. J Am Coll Radiol 2013; 10:789-794. Aortic aneurysm NOS (ICD10-I71.9). 4. Mild cardiomegaly. 5. Moderate bilateral pleural effusions with partial loculation of a small portion of the right pleural effusion. 6. Morphologic changes in the liver suggestive of underlying cirrhosis with 2 ill-defined areas of hypervascularity. These may simply represent benign perfusion anomalies, however, given the cirrhotic changes, further evaluation with nonemergent abdominal MRI with and without IV gadolinium is strongly recommended in the near future to exclude underlying hepatocellular carcinoma. Correlation with AFP levels is also recommended. 7. Small volume of ascites. 8. Colonic and small bowel diverticulosis without evidence of acute diverticulitis at this time. 9. Additional incidental findings, as above.   Electronically Signed   By: Vinnie Langton M.D.   On: 12/31/2019 08:23    EKG: NSR w/ RBBB (01/01/2020)     Impression:  Patient has complex combination of multiple medical problems including stage D1 severe symptomatic aortic stenosis, multivessel coronary artery disease  status post PCI and stenting in the remote past, pericardial effusion, stage IV chronic kidney disease, anemia, paroxysmal atrial fibrillation, chronic persistent C. difficile colitis, and severe malnutrition with severe physical deconditioning.  His current quality of life is extremely poor and limited primarily by his persistent diarrhea, severe generalized weakness with physical deconditioning, chest pain, and shortness of breath.  He does describe chronic symptoms of shortness of breath that are undoubtedly multifactorial and related to both his chronic kidney disease, bilateral pleural effusions, chronic diastolic congestive heart failure, and possibly his pericardial effusion.  I have personally reviewed the patient's most recent transthoracic echocardiogram, diagnostic cardiac catheterization, and CT angiograms.  Echocardiograms confirmed the presence of severe aortic stenosis with normal left ventricular systolic function and moderate pericardial effusion.  The aortic valve is trileaflet with severe thickening, calcification, and restricted leaflet mobility involving all 3 leaflets.  Peak velocity across the aortic  valve has measured greater than 4.0 m/s corresponding to mean transvalvular gradient estimated 36 mmHg and aortic valve area calculated 0.8 cm with DVI reported 0.25.  The patient also has moderate aortic insufficiency.  The patient's pericardial effusion appeared relatively unchanged in size between June and July of this year.  He has not had follow-up imaging since.  Diagnostic cardiac catheterization performed last June revealed multivessel coronary artery disease with continued patency of stents in the left anterior descending coronary artery and the diagonal branch.  There was diffuse disease but there did not appear to be any clearly flow-limiting areas of stenosis.  Cardiac gated CT angiogram of the heart confirmed the presence of findings consistent with severe aortic stenosis with  anatomical characteristics potentially suitable for transcatheter aortic valve replacement without any particular limitations.  The patient did have a pericardial effusion as well as significant bilateral pleural effusions.  CTA of the aorta and iliac vessels reveals diffuse atherosclerotic disease and mild fusiform aneurysmal enlargement of the infrarenal aorta but what appear to be adequate pelvic vascular access to facilitate a transfemoral approach for transcatheter aortic valve replacement.  I would not consider this elderly patient with multiple medical problems to be a candidate for conventional surgical aortic valve replacement with or without coronary artery bypass grafting under any circumstances.  He has severe chronic kidney disease and may be approaching the need for initiation of long-term dialysis.  His current functional status is quite limited, he is severely malnourished, and his quality of life seems extremely poor.  His chronic C. difficile colitis appears to be contributing considerably to his worsening condition.  Options for management of his aortic stenosis include high risk transcatheter aortic valve replacement versus long-term palliative medical care.  I would favor long-term palliative medical care without any type of surgical intervention.   Plan:  The patient and his wife counseled at length regarding treatment alternatives for management of severe symptomatic aortic stenosis.  We discussed his numerous comorbid medical problems at length as well as his severe generalized weakness with physical deconditioning and poor quality of life.  Patient has made it very clear in the office today that he would not wish to be considered a candidate for any type of aggressive medical or surgical intervention should his condition deteriorate in any significant way.  He does not wish to be placed on dialysis.  At this point in time he favors long-term palliative care for all of his medical  problems.  His wife seems to be struggling with this decision and they plan to discuss matters further with their son.  I have offered to meet with them again in the future if desired, but I provided support for decision to progress towards long-term medical therapy without any type of surgical intervention.  All questions answered.   I spent in excess of 90 minutes during the conduct of this office consultation and >50% of this time involved direct face-to-face encounter with the patient for counseling and/or coordination of their care.     Valentina Gu. Roxy Manns, MD 03/15/2020 2:10 PM

## 2020-03-15 NOTE — Patient Instructions (Signed)
Continue all previous medications without any changes at this time  

## 2020-03-17 ENCOUNTER — Telehealth: Payer: Self-pay | Admitting: Medical

## 2020-03-17 ENCOUNTER — Encounter: Payer: Self-pay | Admitting: Medical

## 2020-03-17 NOTE — Telephone Encounter (Signed)
I am unable to accept transfers of care at this time.

## 2020-03-17 NOTE — Telephone Encounter (Signed)
Hey Dr Fuller Plan, this pt is wanting to know if they could be seen by you back again since they last saw [facility] in 2019.

## 2020-03-17 NOTE — Telephone Encounter (Signed)
Terry Garner said she forgot to let you know that pt decided not to have to heart surgery or dialysis.  Said heart doctor told pt that he looked really tired and weak and for him to go home & think about it.

## 2020-03-17 NOTE — Telephone Encounter (Signed)
P.A. CYPROHEPTADINE denied, plan exclusion.  Appeal letter typed and faxed.  In the meantime, called pharmacy had them use discount card and went thru cash price for $22.53.  Called wife and informed.  They are ordering and will be in by Monday.

## 2020-03-17 NOTE — Telephone Encounter (Signed)
Called & spoke with wife, she said pt is some better said still going to the bathroom a lot, not always diarrhea.  But goes often.  They go to infectious disease 03/31/20.  She wants to know if he needs to come back in to be tested to see if the C diff is gone,  She wants them to be able to go back to church and have visitors but want to make sure it's safe first.  Please let her know.

## 2020-03-17 NOTE — Telephone Encounter (Signed)
If that is his first appt , I really wanted him to be seen now, much sooner .  Can we try and push his appt with infectious disease now.  I need their guidance on treatment

## 2020-03-17 NOTE — Telephone Encounter (Signed)
Spoke to infectious disease and the doctor is out of the office next week which is why he was scheduled for 03/31/20.

## 2020-03-19 NOTE — Telephone Encounter (Signed)
I recommend we do a follow-up in discussing things.  If he is losing weight rapidly, not eating well, not sure if he needs to be hospitalized or not?  I really intended for him to get into the infectious disease clinic way sooner than now to get further recommendations to treat the C. Difficile  At this point less discuss overall health and concerns  If needed get him in sooner than a week from now when I return

## 2020-03-24 NOTE — Telephone Encounter (Signed)
I do want him to see infectious disease as scheduled.  My other question they had to do with whether he was losing weight rapidly.  If so he may not be able to wait that long.  So please review back over my questions

## 2020-03-24 NOTE — Telephone Encounter (Signed)
Pt has appointment with infectious disease on 03/31/20. Are you wanting patient to come in the office after that appointment? When would you like patient to be scheduled?

## 2020-03-25 NOTE — Telephone Encounter (Signed)
Called patient, no answer 

## 2020-03-28 ENCOUNTER — Ambulatory Visit: Payer: Medicare Other | Admitting: Cardiovascular Disease

## 2020-03-30 ENCOUNTER — Telehealth: Payer: Self-pay | Admitting: *Deleted

## 2020-03-30 NOTE — Telephone Encounter (Signed)
Pt's wife called to schedule a follow up appt with Dr. Roxy Manns to further discuss surgical intervention for Mr. Ricke. Appt scheduled for 11/1, staff message sent to Dr. Roxy Manns to confirm.

## 2020-03-31 ENCOUNTER — Other Ambulatory Visit: Payer: Self-pay

## 2020-03-31 ENCOUNTER — Ambulatory Visit: Payer: Medicare Other | Admitting: Internal Medicine

## 2020-03-31 ENCOUNTER — Encounter: Payer: Self-pay | Admitting: Internal Medicine

## 2020-03-31 DIAGNOSIS — A0472 Enterocolitis due to Clostridium difficile, not specified as recurrent: Secondary | ICD-10-CM | POA: Diagnosis not present

## 2020-03-31 NOTE — Progress Notes (Signed)
[facility] for Infectious Disease  Patient Active Problem List   Diagnosis Date Noted  . Enzyme immunoassay (EIA) positive for sapovirus 02/24/2020    Priority: High  . C. difficile enteritis 02/12/2020    Priority: High  . Weight loss 02/12/2020    Priority: Medium  . Appetite impaired 02/12/2020  . Senile purpura ([place]) 02/12/2020  . Malnutrition of mild degree ([place]) 02/12/2020  . Anemia in stage 4 chronic kidney disease ([place]) 01/08/2020  . Loose stools 01/08/2020  . DOE (dyspnea on exertion) 01/08/2020  . Abnormal liver scan 01/08/2020  . History of recent hospitalization 01/08/2020  . Angina pectoris ([place]) 01/01/2020  . Coronary artery disease of native artery of native heart with stable angina pectoris ([place])   . Severe aortic stenosis 12/10/2019  . Pain in a tooth or teeth 06/01/2019  . Abnormal weight loss 06/01/2019  . 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 Hearing loss 06/01/2019  . Renal artery stenosis ([place]) 01/09/2019  . Herpes zoster without complication 09/81/1914  . Bile leak 10/08/2017  . Chronic anemia 08/19/2015  . CKD (chronic kidney disease) stage 4, GFR 15-29 ml/min (HCC) 08/19/2015  . BMI 23.0-23.9, adult 05/17/2015  . Abdominal aortic aneurysm ([place]) 12/02/2013  . Medication management 09/01/2013  . Uncontrolled hypertension 07/17/2013  . Other abnormal glucose 07/17/2013  . Gout   . Vitamin D deficiency   . 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 Carotid artery disease ([place]) 01/15/2013  . Right bundle branch block 01/15/2013  . Hyperlipidemia 01/15/2013  . Herniated lumbar intervertebral disc 06/06/2012    Patient's Medications  New Prescriptions   No medications on file  Previous Medications   ALLOPURINOL (ZYLOPRIM) 300 MG TABLET    Take 1 tablet (300 mg total) by mouth daily.   AMLODIPINE (NORVASC) 10 MG TABLET    Take 1 tablet (10 mg total) by mouth daily.   APIXABAN (ELIQUIS) 2.5 MG TABS TABLET    Take 1 tablet (2.5 mg total) by mouth 2 (two) times daily.    BRIMONIDINE (ALPHAGAN) 0.15 % OPHTHALMIC SOLUTION    Place 1 drop into both eyes 2 (two) times daily.   CHOLECALCIFEROL (VITAMIN D3) 5000 UNITS TABS    Take 5,000 Units by mouth daily.   CLOPIDOGREL (PLAVIX) 75 MG TABLET    Take 1 tablet (75 mg total) by mouth daily.   CYPROHEPTADINE (PERIACTIN) 4 MG TABLET    Take 1 tablet (4 mg total) by mouth 2 (two) times daily.   DORZOLAMIDE (TRUSOPT) 2 % OPHTHALMIC SOLUTION    Place 1 drop into the right eye 2 (two) times daily.    FENOFIBRATE MICRONIZED (LOFIBRA) 134 MG CAPSULE    Take 1 capsule (134 mg total) by mouth daily.   FERROUS SULFATE 325 (65 FE) MG TABLET    Take 325 mg by mouth daily with breakfast.   FUROSEMIDE (LASIX) 80 MG TABLET    Take 1 tablet (80 mg total) by mouth 2 (two) times daily.   HYDRALAZINE (APRESOLINE) 25 MG TABLET    Take 1 tablet (25 mg total) by mouth 3 (three) times daily.   ISOSORBIDE MONONITRATE (IMDUR) 30 MG 24 HR TABLET    Take 1 tablet (30 mg total) by mouth 2 (two) times daily.   KETOROLAC (ACULAR) 0.5 % OPHTHALMIC SOLUTION    Place 1 drop into the right eye daily as needed (itching eye).    LACTOBACILLUS-INULIN (Stamps) CAPS    Take 1 capsule by mouth 3 (  three) times daily.   METOPROLOL TARTRATE (LOPRESSOR) 25 MG TABLET    Take 0.5 tablets (12.5 mg total) by mouth 2 (two) times daily.   NEPAFENAC (ILEVRO) 0.3 % OPHTHALMIC SUSPENSION    Place 1 drop into the right eye at bedtime.    PRAVASTATIN (PRAVACHOL) 40 MG TABLET    TAKE 1 TABLET BY MOUTH AT BEDTIME FOR CHOLESTEROL   TRIAMCINOLONE CREAM (KENALOG) 0.1 %    Apply 1 application topically daily as needed (rash on Scalp).   Modified Medications   No medications on file  Discontinued Medications   FIDAXOMICIN (DIFICID) 200 MG TABS TABLET    Take 1 tablet (200 mg total) by mouth 2 (two) times daily.    Subjective: [name] is in with his wife for his routine follow-up visit.  I saw him for initial consultation on 02/24/2020 for persistent  diarrhea.  He had tested positive for C. difficile and Sapovirus.  His diarrhea persisted despite treatment with metronidazole followed by oral vancomycin.  I treated him with a 10-day course of fidaxomicin.  His diarrhea has resolved.  He still having frequent bowel movements but his stools are formed.  His appetite is improved and he has regained some of the weight he lost.  Review of Systems: Review of Systems  Constitutional: Negative for fever and weight loss.  Gastrointestinal: Negative for abdominal pain, diarrhea, nausea and vomiting.    Past Medical History:  Diagnosis Date  . AAA (abdominal aortic aneurysm) (HCC)    3.6 cm (01/09/12 ultrasound)  . Allergy   . Arthritis   . Bile duct leak 09/2017   post op  . Carotid artery disease ([place])   . Cataract   . Chronic kidney disease    CKD, right renal artery stenosis  . Coronary artery disease    mild left internal carotid artery stenosis  . Glaucoma   . Gout   . Heart murmur   . Hyperlipidemia   . Hypertension   . Tubular adenoma of colon 2003   Dr. Watt Climes  . Vitamin D deficiency     Social History   Tobacco Use  . Smoking status: Never Smoker  . Smokeless tobacco: Never Used  Vaping Use  . Vaping Use: Never used  Substance Use Topics  . Alcohol use: No  . Drug use: No    Family History  Problem Relation Age of Onset  . Other Brother        laryngeal cancer  . Heart disease Brother   . Heart disease Mother   . Heart disease Father   . Stroke Father   . Heart disease Sister   . Diabetes Brother   . Colon cancer Neg Hx   . Esophageal cancer Neg Hx   . Stomach cancer Neg Hx   . Rectal cancer Neg Hx     Allergies  Allergen Reactions  . Toprol Xl [Metoprolol Tartrate] Other (See Comments)    Bradycardia with beta blockers  . Lipitor [Atorvastatin]     Myalgias   . Coumadin [Warfarin Sodium] Rash  . Tape Rash    Clear tape  . Warfarin Rash    Objective: Vitals:   03/31/20 1103  BP: (!) 153/54    Pulse: (!) 49  Temp: 97.6 F (36.4 C)  TempSrc: Oral  Weight: 156 lb (70.8 kg)   Body mass index is 20.03 kg/m.  Physical Exam Constitutional:      General: He is not in acute distress. HENT:  Ears:     Comments: He is hard of hearing. Cardiovascular:     Rate and Rhythm: Normal rate and regular rhythm.     Heart sounds: No murmur heard.   Pulmonary:     Effort: Pulmonary effort is normal.     Breath sounds: Normal breath sounds.  Abdominal:     Palpations: Abdomen is soft.     Tenderness: There is no abdominal tenderness.     Lab Results    Problem List Items Addressed This Visit      High   C. difficile enteritis    He is improving following recent treatment with fidaxomicin.  He does not need any further testing or treatment at this point.  I will arrange a phone follow-up visit in 6 weeks.          Michel Bickers, MD [facility] [phone] pager   [phone] cell 03/31/2020, 11:55 AM

## 2020-03-31 NOTE — Assessment & Plan Note (Signed)
He is improving following recent treatment with fidaxomicin.  He does not need any further testing or treatment at this point.  I will arrange a phone follow-up visit in 6 weeks.

## 2020-04-11 ENCOUNTER — Other Ambulatory Visit: Payer: Self-pay

## 2020-04-11 ENCOUNTER — Encounter (INDEPENDENT_AMBULATORY_CARE_PROVIDER_SITE_OTHER): Payer: Medicare Other | Admitting: Ophthalmology

## 2020-04-11 DIAGNOSIS — H34811 Central retinal vein occlusion, right eye, with macular edema: Secondary | ICD-10-CM | POA: Diagnosis not present

## 2020-04-11 DIAGNOSIS — H35033 Hypertensive retinopathy, bilateral: Secondary | ICD-10-CM | POA: Diagnosis not present

## 2020-04-11 DIAGNOSIS — H43813 Vitreous degeneration, bilateral: Secondary | ICD-10-CM

## 2020-04-11 DIAGNOSIS — H353122 Nonexudative age-related macular degeneration, left eye, intermediate dry stage: Secondary | ICD-10-CM

## 2020-04-11 DIAGNOSIS — I10 Essential (primary) hypertension: Secondary | ICD-10-CM | POA: Diagnosis not present

## 2020-04-11 DIAGNOSIS — H348122 Central retinal vein occlusion, left eye, stable: Secondary | ICD-10-CM

## 2020-04-21 ENCOUNTER — Telehealth: Payer: Self-pay

## 2020-04-21 NOTE — Telephone Encounter (Signed)
  [facility]   MULTIDISCIPLINARY HEART VALVE TEAM  I contacted the pt's wife to discuss how the pt is feeling since his evaluation with Dr Roxy Manns.  She states the pt's cdiff symptoms are improving and he has gained a few pounds.  The pt did do a little yard work this past Tuesday.  After his visit with Dr Roxy Manns the pt had a discussion with his wife and son about his wishes and he does not want to be on dialysis. The pt has undergone AVF placement and this has been given time to mature.  Dr Roxy Manns discussed the risk of worsening renal function due to contrast with TAVR and the potential for needing dialysis. The pt does not wish to proceed with TAVR at this time. I will cancel the pt's follow-up appointment with Dr Roxy Manns scheduled on 11/1.  The pt has a scheduled evaluation with his PCP tomorrow and he will keep this appointment.

## 2020-04-22 ENCOUNTER — Encounter: Payer: Self-pay | Admitting: Medical

## 2020-04-22 ENCOUNTER — Telehealth: Payer: Self-pay

## 2020-04-22 ENCOUNTER — Ambulatory Visit (INDEPENDENT_AMBULATORY_CARE_PROVIDER_SITE_OTHER): Payer: Medicare Other | Admitting: Medical

## 2020-04-22 ENCOUNTER — Other Ambulatory Visit: Payer: Self-pay

## 2020-04-22 VITALS — BP 114/68 | HR 47 | Ht 74.0 in | Wt 155.6 lb

## 2020-04-22 DIAGNOSIS — Z7189 Other specified counseling: Secondary | ICD-10-CM | POA: Diagnosis not present

## 2020-04-22 DIAGNOSIS — Z23 Encounter for immunization: Secondary | ICD-10-CM

## 2020-04-22 DIAGNOSIS — E440 Moderate protein-calorie malnutrition: Secondary | ICD-10-CM | POA: Diagnosis not present

## 2020-04-22 DIAGNOSIS — N184 Chronic kidney disease, stage 4 (severe): Secondary | ICD-10-CM | POA: Diagnosis not present

## 2020-04-22 DIAGNOSIS — I350 Nonrheumatic aortic (valve) stenosis: Secondary | ICD-10-CM

## 2020-04-22 DIAGNOSIS — Z7185 Encounter for immunization safety counseling: Secondary | ICD-10-CM

## 2020-04-22 DIAGNOSIS — D631 Anemia in chronic kidney disease: Secondary | ICD-10-CM

## 2020-04-22 DIAGNOSIS — I714 Abdominal aortic aneurysm, without rupture, unspecified: Secondary | ICD-10-CM

## 2020-04-22 DIAGNOSIS — Z79899 Other long term (current) drug therapy: Secondary | ICD-10-CM | POA: Diagnosis not present

## 2020-04-22 DIAGNOSIS — E441 Mild protein-calorie malnutrition: Secondary | ICD-10-CM | POA: Diagnosis not present

## 2020-04-22 MED ORDER — CYPROHEPTADINE HCL 4 MG PO TABS: 4.0000 mg | ORAL_TABLET | Freq: Two times a day (BID) | ORAL | 3 refills | Status: DC

## 2020-04-22 MED ORDER — ALLOPURINOL 300 MG PO TABS: 300.0000 mg | ORAL_TABLET | Freq: Every day | ORAL | 1 refills | Status: AC

## 2020-04-22 MED ORDER — PRAVASTATIN SODIUM 40 MG PO TABS: 40.0000 mg | ORAL_TABLET | Freq: Every day | ORAL | 2 refills | Status: DC

## 2020-04-22 NOTE — Telephone Encounter (Signed)
Pt. Wife called stating he was here earlier and let you know he needed refills on his Pravastatin, Allopurinol, and Cyproheptadine. She said she called the pharmacy and they were not there yet. I told her his allopurinol should still have another refill on it, but I would send you a message about the other two meds.

## 2020-04-22 NOTE — Telephone Encounter (Signed)
I sent those refills  We will call with lab results

## 2020-04-22 NOTE — Progress Notes (Signed)
Subjective: Chief Complaint  Patient presents with  . Follow-up    wants kidney function checked-not havng heart surgery  . Medication Refill    all refills for 3 month supply   Here with wife today.  Dr. Quay Burow, cardiology Dr. Zigmund Daniel, eye doctor and Dr. Katy Fitch Dentist Dr. Servando Salina, nephrology Dr. Darylene Price with cardiothoracic surgery, upcoming new patient appt Dr. Sherren Mocha, cardiothoracic surgery Dr. Harold Barban, vascular surgery Terry Garner, Camelia Eng, PA-C here for primary care  Interval history:  He was dealing with recurrent c diff since late 01/2020.  During this time he was also having follow up with cardiology regarding possible valve surgery given severe aortic stenosis.  ended up seeing infections disease.  The diarrhea finally resolved.   He was losing weight, had poor appetite.  We through primary care were having difficulty getting megace approved by insurance as well.  He started cyproheptadine and is seeing improvement with this.  Appetite - improved since starting on the Cyproheptadine.   Eating 2 meals a day and some snacks, some ensure drinks.    HTN - was checking BP daily, usually runs 157/73.  Pulse runs 49-53.  This morning 114/63 BP this morning.  Was feeling a little wobbly this morning.    CAD, aortic stenosis - still on Plavix and eliquis.  They note they have double checked this with cardiology.  Gets a little out of breath going to mail box or bathroom.  Uses walker.  Doesn't get out of the house much. No current sweats, SOB, chets pain, palpitations.  He does get swelling in his legs in the evenings  CKD - hasn't seen [facility] in a while.  Not planning on having surgery for shunt at this point, so he didn't feel the need to see kidney specialist.    No bleeding anywhere.  Last night had some ankle swelling.  Left ankle appeared dark.    ambulation - Has a shower chair.  Uses a walker.     Stays cold all the time.  Currently he is  limited to the house most the time.  He will walk out in the driveway and sit out in the sun for a while.  He seems to enjoy this.  He will walk out to the mailbox some.  He is limited by shortness of breath with his cardiac condition.  Has home health nurse that has come out recently.  They discussed having him consider DNR and possibly even hospice  No other aggravating or relieving factors. No other complaint.    Past Medical History:  Diagnosis Date  . AAA (abdominal aortic aneurysm) (HCC)    3.6 cm (01/09/12 ultrasound)  . Allergy   . Arthritis   . Bile duct leak 09/2017   post op  . Carotid artery disease ([place])   . Cataract   . Chronic kidney disease    CKD, right renal artery stenosis  . Coronary artery disease    mild left internal carotid artery stenosis  . Glaucoma   . Gout   . Heart murmur   . Hyperlipidemia   . Hypertension   . Tubular adenoma of colon 2003   Dr. Watt Climes  . Vitamin D deficiency    ROS as in subjective   Objective: BP 114/68   Pulse (!) 47   Ht 6\' 2"  (1.88 m)   Wt 155 lb 9.6 oz (70.6 kg)   SpO2 100%   BMI 19.98 kg/m   Wt Readings from  Last 3 Encounters:  04/22/20 155 lb 9.6 oz (70.6 kg)  03/31/20 156 lb (70.8 kg)  03/15/20 149 lb (67.6 kg)   Gen: Frail white male, nad, seems overall weak compared to his condition a year ago, interview seems decreased Heart 3/6 holosystolic murmur, regular rate and rhythm Lungs clear Abdomen nontender no mass no organomegaly Pulses WNL There is 1+ pitting lower extremity edema mild currently but wife says it gets worse in the evening Some spider veins of the ankle where wife thought he looked purple, medial right ankle Psych: Pleasant, answers questions appropriate, seems coherent, good eye contact Neuro: Hard of hearing, otherwise CN II through XII intact, no obvious tremor, strength seems okay, is somewhat cautious with walking   Assessment: Encounter Diagnoses  Name Primary?  . Severe aortic  stenosis Yes  . CKD (chronic kidney disease) stage 4, GFR 15-29 ml/min (HCC)   . Malnutrition of moderate degree ([place])   . Anemia in stage 4 chronic kidney disease ([place])   . Advanced directives, counseling/discussion   . Need for influenza vaccination   . Medication management   . Abdominal aortic aneurysm (AAA) without rupture ([place])   . High risk medication use   . Vaccine counseling      Plan: We discussed his overall health.  He seems to realize his health is not good.  He seems to realize he is not really in shape to have major surgery.  He finally got over the C. difficile infection.  We had a long discussion today about advanced directives, DNR, living will and healthcare power of attorney.  He stated that he does have a will in place but has not completed the other paperwork he seemed at first to be ready to sign the DNR order but hesitated a little.  He wants to give this a little bit more thought.  He agreed to talk about this again in 2 weeks when he comes back for the Covid booster vaccine.  I tried to thoroughly explain DNR, advanced directives.  I gave him a copy of living will and healthcare power of attorney paperwork so they can work on this.  Apparently home health nurse mentioned hospice or palliative care.  However he does not seem to be in need of additional nursing services or other equipment at this time.  He is still taking care of his own personal needs such as bathing and feeding himself, so I do not think require at that point.  We did discuss this and he and wife both agree that they do not want to pursue any palliative or hospice care yet  Update labs today.  Some of the goals right now is to keep the kidney function stable, we discussed being cautious with his physical activity to not overdo things given his heart condition/valve condition.  Avoid falls, utilize bath chair, walker.   Weight fortunately is stable currently, he is eating better, and cyrophepatidine is  helping    He had a heavy duty bandaid on his right arm today.  advised he avoid those due to potential for tears in skin.  Counseled on the influenza virus vaccine.  Vaccine information sheet given.   High dose Influenza vaccine given after consent obtained.  Return in 2 weeks for covid booster and to revisit DNR discussion  [place] was seen today for follow-up and medication refill.  Diagnoses and all orders for this visit:  Severe aortic stenosis  CKD (chronic kidney disease) stage 4, GFR 15-29 ml/min (  [place]) -     Renal Function Panel -     Hepatic function panel  Malnutrition of moderate degree (HCC)  Anemia in stage 4 chronic kidney disease (HCC) -     CBC with Differential/Platelet -     Iron and TIBC  Advanced directives, counseling/discussion  Need for influenza vaccination -     Flu Vaccine QUAD High Dose(Fluad)  Medication management -     TSH  Abdominal aortic aneurysm (AAA) without rupture (HCC)  High risk medication use  Vaccine counseling  Other orders -     pravastatin (PRAVACHOL) 40 MG tablet; Take 1 tablet (40 mg total) by mouth daily. -     allopurinol (ZYLOPRIM) 300 MG tablet; Take 1 tablet (300 mg total) by mouth daily. -     cyproheptadine (PERIACTIN) 4 MG tablet; Take 1 tablet (4 mg total) by mouth 2 (two) times daily.    Spent > 60 minutes pre-charting, face to face with patient in discussion of symptoms, evaluation, plan and recommendations.

## 2020-04-23 LAB — CBC WITH DIFFERENTIAL/PLATELET
Basophils Absolute: 0 10*3/uL (ref 0.0–0.2)
Basos: 1 %
EOS (ABSOLUTE): 0.1 10*3/uL (ref 0.0–0.4)
Eos: 2 %
Hematocrit: 25.6 % — ABNORMAL LOW (ref 37.5–51.0)
Hemoglobin: 8.6 g/dL — ABNORMAL LOW (ref 13.0–17.7)
Immature Grans (Abs): 0 10*3/uL (ref 0.0–0.1)
Immature Granulocytes: 0 %
Lymphocytes Absolute: 1.1 10*3/uL (ref 0.7–3.1)
Lymphs: 19 %
MCH: 32.2 pg (ref 26.6–33.0)
MCHC: 33.6 g/dL (ref 31.5–35.7)
MCV: 96 fL (ref 79–97)
Monocytes Absolute: 0.3 10*3/uL (ref 0.1–0.9)
Monocytes: 6 %
Neutrophils Absolute: 4.2 10*3/uL (ref 1.4–7.0)
Neutrophils: 72 %
Platelets: 143 10*3/uL — ABNORMAL LOW (ref 150–450)
RBC: 2.67 x10E6/uL — CL (ref 4.14–5.80)
RDW: 15.3 % (ref 11.6–15.4)
WBC: 5.8 10*3/uL (ref 3.4–10.8)

## 2020-04-23 LAB — RENAL FUNCTION PANEL
Albumin: 3.6 g/dL (ref 3.6–4.6)
BUN/Creatinine Ratio: 26 — ABNORMAL HIGH (ref 10–24)
BUN: 96 mg/dL (ref 8–27)
CO2: 24 mmol/L (ref 20–29)
Calcium: 8.6 mg/dL (ref 8.6–10.2)
Chloride: 106 mmol/L (ref 96–106)
Creatinine, Ser: 3.69 mg/dL — ABNORMAL HIGH (ref 0.76–1.27)
GFR calc Af Amer: 17 mL/min/{1.73_m2} — ABNORMAL LOW (ref >59)
GFR calc non Af Amer: 14 mL/min/{1.73_m2} — ABNORMAL LOW (ref >59)
Glucose: 110 mg/dL — ABNORMAL HIGH (ref 65–99)
Phosphorus: 4.1 mg/dL (ref 2.8–4.1)
Potassium: 3.7 mmol/L (ref 3.5–5.2)
Sodium: 146 mmol/L — ABNORMAL HIGH (ref 134–144)

## 2020-04-23 LAB — HEPATIC FUNCTION PANEL
ALT: 14 IU/L (ref 0–44)
AST: 45 IU/L — ABNORMAL HIGH (ref 0–40)
Alkaline Phosphatase: 50 IU/L (ref 44–121)
Bilirubin Total: 0.8 mg/dL (ref 0.0–1.2)
Bilirubin, Direct: 0.47 mg/dL — ABNORMAL HIGH (ref 0.00–0.40)
Total Protein: 5.5 g/dL — ABNORMAL LOW (ref 6.0–8.5)

## 2020-04-23 LAB — TSH: TSH: 3.59 u[IU]/mL (ref 0.450–4.500)

## 2020-04-23 LAB — IRON AND TIBC
Iron Saturation: 38 % (ref 15–55)
Iron: 108 ug/dL (ref 38–169)
Total Iron Binding Capacity: 282 ug/dL (ref 250–450)
UIBC: 174 ug/dL (ref 111–343)

## 2020-04-25 ENCOUNTER — Ambulatory Visit: Payer: Medicare Other | Admitting: Thoracic Surgery (Cardiothoracic Vascular Surgery)

## 2020-04-25 ENCOUNTER — Telehealth: Payer: Self-pay

## 2020-04-25 NOTE — Telephone Encounter (Signed)
error 

## 2020-04-25 NOTE — Telephone Encounter (Signed)
Never heard back from appeal, refaxed.  Pt is getting medication with discount card but insurance should have responded by now to appeal so refaxed.

## 2020-04-26 ENCOUNTER — Telehealth: Payer: Self-pay

## 2020-04-26 NOTE — Telephone Encounter (Signed)
Recv'd phone call back that appeal was denied but pt is using discount card

## 2020-04-29 NOTE — Telephone Encounter (Signed)
error 

## 2020-04-29 NOTE — Telephone Encounter (Signed)
Pt was switched to another medication

## 2020-05-06 ENCOUNTER — Other Ambulatory Visit: Payer: Self-pay

## 2020-05-06 ENCOUNTER — Ambulatory Visit (INDEPENDENT_AMBULATORY_CARE_PROVIDER_SITE_OTHER): Payer: Medicare Other

## 2020-05-06 DIAGNOSIS — Z23 Encounter for immunization: Secondary | ICD-10-CM

## 2020-05-09 ENCOUNTER — Other Ambulatory Visit: Payer: Self-pay

## 2020-05-09 ENCOUNTER — Encounter (INDEPENDENT_AMBULATORY_CARE_PROVIDER_SITE_OTHER): Payer: Medicare Other | Admitting: Ophthalmology

## 2020-05-09 ENCOUNTER — Other Ambulatory Visit: Payer: Self-pay | Admitting: Cardiovascular Disease

## 2020-05-09 DIAGNOSIS — H34811 Central retinal vein occlusion, right eye, with macular edema: Secondary | ICD-10-CM

## 2020-05-09 DIAGNOSIS — H43813 Vitreous degeneration, bilateral: Secondary | ICD-10-CM

## 2020-05-09 DIAGNOSIS — I10 Essential (primary) hypertension: Secondary | ICD-10-CM | POA: Diagnosis not present

## 2020-05-09 DIAGNOSIS — H4311 Vitreous hemorrhage, right eye: Secondary | ICD-10-CM

## 2020-05-09 DIAGNOSIS — H348122 Central retinal vein occlusion, left eye, stable: Secondary | ICD-10-CM

## 2020-05-09 DIAGNOSIS — H35033 Hypertensive retinopathy, bilateral: Secondary | ICD-10-CM | POA: Diagnosis not present

## 2020-05-09 DIAGNOSIS — H34231 Retinal artery branch occlusion, right eye: Secondary | ICD-10-CM

## 2020-05-17 ENCOUNTER — Telehealth (INDEPENDENT_AMBULATORY_CARE_PROVIDER_SITE_OTHER): Payer: Medicare Other | Admitting: Internal Medicine

## 2020-05-17 ENCOUNTER — Telehealth: Payer: Medicare Other | Admitting: Internal Medicine

## 2020-05-17 ENCOUNTER — Telehealth: Payer: Self-pay | Admitting: Internal Medicine

## 2020-05-17 ENCOUNTER — Other Ambulatory Visit: Payer: Self-pay

## 2020-05-17 DIAGNOSIS — A0472 Enterocolitis due to Clostridium difficile, not specified as recurrent: Secondary | ICD-10-CM

## 2020-05-17 NOTE — Telephone Encounter (Signed)
I had a brief phone call with [name] today.  He is feeling much better and is no longer having any diarrhea.  I let him know that he can follow-up here as needed.

## 2020-05-18 NOTE — Progress Notes (Signed)
Virtual Visit via Telephone Note  I connected with Terry Garner on 05/18/20 at  2:15 PM EST by telephone and verified that I am speaking with the correct person using two identifiers.  Location: Patient: Home Provider: [facility]   I discussed the limitations, risks, security and privacy concerns of performing an evaluation and management service by telephone and the availability of in person appointments. I also discussed with the patient that there may be a patient responsible charge related to this service. The patient expressed understanding and agreed to proceed.   History of Present Illness: I called and spoke with [name] by phone today.  He says that he is feeling much better.  His C. difficile diarrhea has resolved completely   Observations/Objective:   Assessment and Plan: His C. difficile diarrhea was cured with a course of fidaxomicin.  Follow Up Instructions: Follow-up here as needed   I discussed the assessment and treatment plan with the patient. The patient was provided an opportunity to ask questions and all were answered. The patient agreed with the plan and demonstrated an understanding of the instructions.   The patient was advised to call back or seek an in-person evaluation if the symptoms worsen or if the condition fails to improve as anticipated.  I provided 13 minutes of non-face-to-face time during this encounter.   Michel Bickers, MD

## 2020-05-19 LAB — ERYTHROPOIETIN: Erythropoietin: 15.7 m[IU]/mL (ref 2.6–18.5)

## 2020-05-19 LAB — SPECIMEN STATUS REPORT

## 2020-05-23 ENCOUNTER — Telehealth: Payer: Self-pay

## 2020-05-23 NOTE — Telephone Encounter (Signed)
Wife called and states pt has fallen on Thanksgiving and hurt himself, said been dizzy.  She states the pharmacist told her the medicine cyprohetadine 4 mg for his appetite can cause dizziness.  But she says she thinks he may have been dizzy before he started it. So she's not sure if the medicine is causes it or not.  She said he weighed 159 his last weight.  His appetite has been good thinks it does help and he has been eating better. She wants to know should she stop it and see if that helps the dizziness or what do you think.  Said he didn't go to the hospital said just hurt his arm and nurse came out and checked it.  And nurse is coming back tomorrow to check him.  Please call her and let her know. Said she didn't give him a pill today

## 2020-05-24 NOTE — Telephone Encounter (Signed)
Patient wife has been informed. Patient has a follow up scheduled for 06/06/2020.

## 2020-05-24 NOTE — Telephone Encounter (Signed)
Go ahead and stop the medication for now.   Schedule follow up soon to discuss/recheck

## 2020-06-02 NOTE — Telephone Encounter (Signed)
Done

## 2020-06-06 ENCOUNTER — Ambulatory Visit (INDEPENDENT_AMBULATORY_CARE_PROVIDER_SITE_OTHER): Payer: Medicare Other | Admitting: Medical

## 2020-06-06 ENCOUNTER — Encounter (INDEPENDENT_AMBULATORY_CARE_PROVIDER_SITE_OTHER): Payer: Medicare Other | Admitting: Ophthalmology

## 2020-06-06 ENCOUNTER — Other Ambulatory Visit: Payer: Self-pay

## 2020-06-06 ENCOUNTER — Encounter: Payer: Self-pay | Admitting: Medical

## 2020-06-06 VITALS — BP 138/60 | HR 48 | Ht 74.0 in | Wt 156.6 lb

## 2020-06-06 DIAGNOSIS — N184 Chronic kidney disease, stage 4 (severe): Secondary | ICD-10-CM

## 2020-06-06 DIAGNOSIS — R195 Other fecal abnormalities: Secondary | ICD-10-CM

## 2020-06-06 DIAGNOSIS — I701 Atherosclerosis of renal artery: Secondary | ICD-10-CM

## 2020-06-06 DIAGNOSIS — K068 Other specified disorders of gingiva and edentulous alveolar ridge: Secondary | ICD-10-CM

## 2020-06-06 DIAGNOSIS — I350 Nonrheumatic aortic (valve) stenosis: Secondary | ICD-10-CM

## 2020-06-06 DIAGNOSIS — H35033 Hypertensive retinopathy, bilateral: Secondary | ICD-10-CM

## 2020-06-06 DIAGNOSIS — H4311 Vitreous hemorrhage, right eye: Secondary | ICD-10-CM

## 2020-06-06 DIAGNOSIS — I25118 Atherosclerotic heart disease of native coronary artery with other forms of angina pectoris: Secondary | ICD-10-CM

## 2020-06-06 DIAGNOSIS — D631 Anemia in chronic kidney disease: Secondary | ICD-10-CM

## 2020-06-06 DIAGNOSIS — H34231 Retinal artery branch occlusion, right eye: Secondary | ICD-10-CM

## 2020-06-06 DIAGNOSIS — Z79899 Other long term (current) drug therapy: Secondary | ICD-10-CM | POA: Diagnosis not present

## 2020-06-06 DIAGNOSIS — H34811 Central retinal vein occlusion, right eye, with macular edema: Secondary | ICD-10-CM | POA: Diagnosis not present

## 2020-06-06 DIAGNOSIS — H348122 Central retinal vein occlusion, left eye, stable: Secondary | ICD-10-CM

## 2020-06-06 DIAGNOSIS — I10 Essential (primary) hypertension: Secondary | ICD-10-CM

## 2020-06-06 DIAGNOSIS — H43813 Vitreous degeneration, bilateral: Secondary | ICD-10-CM

## 2020-06-06 NOTE — Progress Notes (Signed)
Subjective:  Terry Garner is a 79 y.o. male who presents for Chief Complaint  Patient presents with  . Follow-up    Weight and medication     Here for follow-up with his wife  He stopped the cyproheptadine over the last few weeks as he was feeling dizzy and had fallen a few times.  Fortunately he did not get hurt.  The medicine did help with keeping his weight stable and appetite of them.  We also had problems with insurance covering any type of appetite stimulant  Most recently wife noted on 2 occasions recently where he had bleeding gums and inside the mouth looked bloodshot.  He also does have some darker almost black stool recently in recent days.  He has had a few days of loose stools as well 3-4 times per day  No fever no abdominal pain, no nausea or vomiting.  No recent antibiotic use.  He saw infectious disease a few months ago for persistent C. difficile that finally resolved with their treatment  Nonsmoker  After last visit we had communicated with cardiology about his medications, and he did stop Plavix after we discussed this with cardiology since he was on Plavix and Eliquis.  He had realized that his health was in such a decline state that he would not be able to have valve surgery.  We discussed comfort care and quality of life last visit.   Here to discus this a bit more.  No other aggravating or relieving factors.    No other c/o.   Past Medical History:  Diagnosis Date  . AAA (abdominal aortic aneurysm) (HCC)    3.6 cm (01/09/12 ultrasound)  . Allergy   . Arthritis   . Bile duct leak 09/2017   post op  . Carotid artery disease ([place])   . Cataract   . Chronic kidney disease    CKD, right renal artery stenosis  . Coronary artery disease    mild left internal carotid artery stenosis  . Glaucoma   . Gout   . Heart murmur   . Hyperlipidemia   . Hypertension   . Tubular adenoma of colon 2003   Dr. Watt Climes  . Vitamin D deficiency      The following  portions of the patient's history were reviewed and updated as appropriate: allergies, current medications, past family history, past medical history, past social history, past surgical history and problem list.  ROS Otherwise as in subjective above     Objective: BP 138/60   Pulse (!) 48   Ht 6' 2" (1.88 m)   Wt 156 lb 9.6 oz (71 kg)   SpO2 98%   BMI 20.11 kg/m   Wt Readings from Last 3 Encounters:  06/06/20 156 lb 9.6 oz (71 kg)  04/22/20 155 lb 9.6 oz (70.6 kg)  03/31/20 156 lb (70.8 kg)    General appearance: alert, no distress, well developed, well nourished HEENT: normocephalic, sclerae anicteric, conjunctiva pink and moist, TMs pearly, nares patent, no discharge or erythema, pharynx normal Oral cavity: MMM, no lesions, no obvious bleeding Neck: supple, no lymphadenopathy, no thyromegaly, no masses Heart: RRR, normal S1, S2, no murmurs Lungs: CTA bilaterally, no wheezes, rhonchi, or rales Abdomen: +bs, soft, non tender, non distended, no masses, no hepatomegaly, no splenomegaly Pulses: 2+ radial pulses, 2+ pedal pulses, normal cap refill Ext: no edema   Assessment: Encounter Diagnoses  Name Primary?  . Dark stools Yes  . Bleeding gums   . CKD (chronic  kidney disease) stage 4, GFR 15-29 ml/min (HCC)   . High risk medication use   . Loose stools   . Coronary artery disease of native artery of native heart with stable angina pectoris ([place])   . Renal artery stenosis ([place])   . Severe aortic stenosis   . 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  Labs today to make sure platelets are not dangerously low.  He has stopped Plavix over the last month and just on Eliquis now.  No pain and nontender on abdomen today.  Loose stools starting again, history of C. difficile a few months ago that required multiple treatments.  He has had no new risk such as hospitalization or antibiotic use.  I sent  him home with the stool collection kit just in case the loose stools persist over the next several days  He is still currently taking a probiotic  Due to fall in lightheadedness on cyproheptadine, he has stopped this medication.  It did help for a time to keep his weight stable and he is currently doing okay with eating habits and weight  CKD-continue to monitor lab, avoid NSAIDs and dehydration  Heart disease, aortic stenosis-medical management at this point  Since last visit I discussed his case with cardiology.  Since he is not in good enough health to have aortic valve surgery, they had recommended consideration of palliative care.  Currently he and wife are doing okay with his ADLs and overall day-to-day activities, so they declined referral at this time.  I did stop his statin and fenofibrate today to lessen his pill burden as we are aiming more towards quality of life measures at this point.  He and wife are in agreement with this today.  He already stopped Plavix from last visit after we consulted with cardiology.  He continues on Eliquis but we need to check platelets today given his current symptoms of bleeding gums which was not present today on exam  Carlin was seen today for follow-up.  Diagnoses and all orders for this visit:  Dark stools -     CBC with Differential/Platelet -     Comprehensive metabolic panel -     Cancel: GI Profile, Stool, PCR  Bleeding gums  CKD (chronic kidney disease) stage 4, GFR 15-29 ml/min (HCC) -     CBC with Differential/Platelet -     Comprehensive metabolic panel  High risk medication use -     CBC with Differential/Platelet -     Comprehensive metabolic panel  Loose stools -     Cancel: GI Profile, Stool, PCR  Coronary artery disease of native artery of native heart with stable angina pectoris (HCC)  Renal artery stenosis (HCC)  Severe aortic stenosis  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

## 2020-06-07 LAB — COMPREHENSIVE METABOLIC PANEL
ALT: 14 IU/L (ref 0–44)
AST: 47 IU/L — ABNORMAL HIGH (ref 0–40)
Albumin/Globulin Ratio: 1.7 (ref 1.2–2.2)
Albumin: 3.4 g/dL — ABNORMAL LOW (ref 3.6–4.6)
Alkaline Phosphatase: 51 IU/L (ref 44–121)
BUN/Creatinine Ratio: 16 (ref 10–24)
BUN: 65 mg/dL — ABNORMAL HIGH (ref 8–27)
Bilirubin Total: 1 mg/dL (ref 0.0–1.2)
CO2: 23 mmol/L (ref 20–29)
Calcium: 8.6 mg/dL (ref 8.6–10.2)
Chloride: 104 mmol/L (ref 96–106)
Creatinine, Ser: 4.02 mg/dL — ABNORMAL HIGH (ref 0.76–1.27)
GFR calc Af Amer: 15 mL/min/{1.73_m2} — ABNORMAL LOW (ref >59)
GFR calc non Af Amer: 13 mL/min/{1.73_m2} — ABNORMAL LOW (ref >59)
Globulin, Total: 2 g/dL (ref 1.5–4.5)
Glucose: 95 mg/dL (ref 65–99)
Potassium: 3.9 mmol/L (ref 3.5–5.2)
Sodium: 145 mmol/L — ABNORMAL HIGH (ref 134–144)
Total Protein: 5.4 g/dL — ABNORMAL LOW (ref 6.0–8.5)

## 2020-06-07 LAB — CBC WITH DIFFERENTIAL/PLATELET
Basophils Absolute: 0.1 10*3/uL (ref 0.0–0.2)
Basos: 1 %
EOS (ABSOLUTE): 0.1 10*3/uL (ref 0.0–0.4)
Eos: 2 %
Hematocrit: 26.3 % — ABNORMAL LOW (ref 37.5–51.0)
Hemoglobin: 9 g/dL — ABNORMAL LOW (ref 13.0–17.7)
Immature Grans (Abs): 0 10*3/uL (ref 0.0–0.1)
Immature Granulocytes: 0 %
Lymphocytes Absolute: 1.3 10*3/uL (ref 0.7–3.1)
Lymphs: 23 %
MCH: 31 pg (ref 26.6–33.0)
MCHC: 34.2 g/dL (ref 31.5–35.7)
MCV: 91 fL (ref 79–97)
Monocytes Absolute: 0.3 10*3/uL (ref 0.1–0.9)
Monocytes: 5 %
Neutrophils Absolute: 4.1 10*3/uL (ref 1.4–7.0)
Neutrophils: 69 %
Platelets: 153 10*3/uL (ref 150–450)
RBC: 2.9 x10E6/uL — ABNORMAL LOW (ref 4.14–5.80)
RDW: 14.4 % (ref 11.6–15.4)
WBC: 5.9 10*3/uL (ref 3.4–10.8)

## 2020-06-08 ENCOUNTER — Other Ambulatory Visit: Payer: Self-pay | Admitting: Medical

## 2020-06-08 ENCOUNTER — Encounter: Payer: Self-pay | Admitting: Medical

## 2020-06-08 MED ORDER — MIRTAZAPINE 15 MG PO TBDP: 15.0000 mg | ORAL_TABLET | Freq: Every day | ORAL | 2 refills | Status: AC

## 2020-06-13 ENCOUNTER — Telehealth: Payer: Self-pay | Admitting: Family Medicine

## 2020-06-13 NOTE — Telephone Encounter (Signed)
No, I just discontinued this recently, so let pharmacy know

## 2020-06-13 NOTE — Telephone Encounter (Signed)
[facility] req refill Pravastatin 40 mg.  I do not see it on the list

## 2020-06-13 NOTE — Telephone Encounter (Signed)
Not seen in chart that patient is taking Pravastatin. Is this appropriate?

## 2020-06-29 ENCOUNTER — Ambulatory Visit (HOSPITAL_COMMUNITY)
Admission: RE | Admit: 2020-06-29 | Payer: Medicare Other | Source: Ambulatory Visit | Attending: Cardiovascular Disease | Admitting: Cardiovascular Disease

## 2020-07-04 ENCOUNTER — Other Ambulatory Visit: Payer: Self-pay

## 2020-07-04 ENCOUNTER — Encounter (INDEPENDENT_AMBULATORY_CARE_PROVIDER_SITE_OTHER): Payer: Medicare Other | Admitting: Ophthalmology

## 2020-07-04 DIAGNOSIS — H34231 Retinal artery branch occlusion, right eye: Secondary | ICD-10-CM | POA: Diagnosis not present

## 2020-07-04 DIAGNOSIS — H35033 Hypertensive retinopathy, bilateral: Secondary | ICD-10-CM

## 2020-07-04 DIAGNOSIS — H348122 Central retinal vein occlusion, left eye, stable: Secondary | ICD-10-CM

## 2020-07-04 DIAGNOSIS — I10 Essential (primary) hypertension: Secondary | ICD-10-CM | POA: Diagnosis not present

## 2020-07-04 DIAGNOSIS — H43813 Vitreous degeneration, bilateral: Secondary | ICD-10-CM

## 2020-07-04 DIAGNOSIS — H34811 Central retinal vein occlusion, right eye, with macular edema: Secondary | ICD-10-CM | POA: Diagnosis not present

## 2020-07-05 ENCOUNTER — Telehealth: Payer: Self-pay | Admitting: Medical

## 2020-07-05 NOTE — Telephone Encounter (Signed)
Check on status of Palliative care referral  Call and see if wife thinks he would be agreeable to add a medication to help with mood?  Recently insurance did a house call and depression was a concern

## 2020-07-06 NOTE — Telephone Encounter (Signed)
Left detail message on machine

## 2020-07-06 NOTE — Telephone Encounter (Signed)
Pallative care has started. [facility] patient yesterday and paperwork was filled out. She stated she think it would be ok for patient to take something for mood. He is taking Mirtazapine he has been taking for 3 weeks.

## 2020-07-06 NOTE — Telephone Encounter (Signed)
That is correct, I forgot he was on this.  I had to look back over the chart to make sure I was not overlooking things.  Continue this medicine for mood

## 2020-08-01 ENCOUNTER — Encounter (INDEPENDENT_AMBULATORY_CARE_PROVIDER_SITE_OTHER): Payer: Medicare Other | Admitting: Ophthalmology

## 2020-09-19 ENCOUNTER — Other Ambulatory Visit (HOSPITAL_COMMUNITY): Payer: Self-pay | Admitting: Cardiovascular Disease

## 2020-09-19 DIAGNOSIS — I701 Atherosclerosis of renal artery: Secondary | ICD-10-CM

## 2020-09-23 DEATH — deceased

## 2021-01-03 ENCOUNTER — Telehealth: Payer: Self-pay | Admitting: Medical

## 2021-01-03 NOTE — Telephone Encounter (Signed)
Sympathy care mailed to family.

## 2021-01-03 NOTE — Telephone Encounter (Signed)
He passed away last week  Please send sympathy card , and let wife know in the letter that I am glad I got to know him and have some conversations with them together.
